# Patient Record
Sex: Female | Born: 1935 | Race: White | Hispanic: No | Marital: Married | Smoking: Former smoker
Health system: Southern US, Community
[De-identification: ages and names within clinical notes are randomized; demographics above are authoritative.]

## PROBLEM LIST (undated history)

## (undated) DIAGNOSIS — I739 Peripheral vascular disease, unspecified: Secondary | ICD-10-CM

## (undated) DIAGNOSIS — F329 Major depressive disorder, single episode, unspecified: Secondary | ICD-10-CM

## (undated) DIAGNOSIS — G4730 Sleep apnea, unspecified: Secondary | ICD-10-CM

## (undated) DIAGNOSIS — J189 Pneumonia, unspecified organism: Secondary | ICD-10-CM

## (undated) DIAGNOSIS — J449 Chronic obstructive pulmonary disease, unspecified: Secondary | ICD-10-CM

## (undated) DIAGNOSIS — S4990XA Unspecified injury of shoulder and upper arm, unspecified arm, initial encounter: Secondary | ICD-10-CM

## (undated) DIAGNOSIS — T7840XA Allergy, unspecified, initial encounter: Secondary | ICD-10-CM

## (undated) DIAGNOSIS — M1990 Unspecified osteoarthritis, unspecified site: Secondary | ICD-10-CM

## (undated) DIAGNOSIS — R0602 Shortness of breath: Secondary | ICD-10-CM

## (undated) DIAGNOSIS — K8020 Calculus of gallbladder without cholecystitis without obstruction: Secondary | ICD-10-CM

## (undated) DIAGNOSIS — E669 Obesity, unspecified: Secondary | ICD-10-CM

## (undated) DIAGNOSIS — E785 Hyperlipidemia, unspecified: Secondary | ICD-10-CM

## (undated) DIAGNOSIS — I10 Essential (primary) hypertension: Secondary | ICD-10-CM

## (undated) DIAGNOSIS — N189 Chronic kidney disease, unspecified: Secondary | ICD-10-CM

## (undated) DIAGNOSIS — F32A Depression, unspecified: Secondary | ICD-10-CM

## (undated) DIAGNOSIS — K219 Gastro-esophageal reflux disease without esophagitis: Secondary | ICD-10-CM

## (undated) DIAGNOSIS — R42 Dizziness and giddiness: Secondary | ICD-10-CM

## (undated) HISTORY — DX: Obesity, unspecified: E66.9

## (undated) HISTORY — DX: Allergy, unspecified, initial encounter: T78.40XA

## (undated) HISTORY — PX: BREAST SURGERY: SHX581

## (undated) HISTORY — DX: Gastro-esophageal reflux disease without esophagitis: K21.9

## (undated) HISTORY — DX: Chronic obstructive pulmonary disease, unspecified: J44.9

## (undated) HISTORY — DX: Unspecified osteoarthritis, unspecified site: M19.90

## (undated) HISTORY — DX: Essential (primary) hypertension: I10

## (undated) HISTORY — DX: Calculus of gallbladder without cholecystitis without obstruction: K80.20

## (undated) HISTORY — DX: Hyperlipidemia, unspecified: E78.5

## (undated) HISTORY — DX: Depression, unspecified: F32.A

## (undated) HISTORY — DX: Major depressive disorder, single episode, unspecified: F32.9

## (undated) HISTORY — PX: OTHER SURGICAL HISTORY: SHX169

## (undated) HISTORY — DX: Unspecified injury of shoulder and upper arm, unspecified arm, initial encounter: S49.90XA

## (undated) HISTORY — PX: TOTAL KNEE ARTHROPLASTY: SHX125

## (undated) HISTORY — PX: HIP ARTHROPLASTY: SHX981

## (undated) HISTORY — PX: CHOLECYSTECTOMY: SHX55

---

## 2005-11-12 ENCOUNTER — Ambulatory Visit: Payer: Self-pay | Admitting: Internal Medicine

## 2006-01-19 ENCOUNTER — Ambulatory Visit: Payer: Self-pay | Admitting: Internal Medicine

## 2006-02-02 ENCOUNTER — Inpatient Hospital Stay (HOSPITAL_COMMUNITY): Admission: RE | Admit: 2006-02-02 | Discharge: 2006-02-06 | Payer: Self-pay | Admitting: Orthopedic Surgery

## 2006-02-14 ENCOUNTER — Ambulatory Visit: Payer: Self-pay | Admitting: Internal Medicine

## 2006-03-08 ENCOUNTER — Ambulatory Visit: Payer: Self-pay | Admitting: Internal Medicine

## 2006-03-08 LAB — CONVERTED CEMR LAB
BUN: 13 mg/dL (ref 6–23)
Creatinine, Ser: 0.8 mg/dL (ref 0.4–1.2)
Crystals: NEGATIVE
Glucose, Bld: 107 mg/dL — ABNORMAL HIGH (ref 70–99)
HCT: 40.5 % (ref 36.0–46.0)
Hemoglobin: 13.2 g/dL (ref 12.0–15.0)
Iron: 47 ug/dL (ref 42–145)
Ketones, ur: NEGATIVE mg/dL
MCHC: 32.6 g/dL (ref 30.0–36.0)
MCV: 86.4 fL (ref 78.0–100.0)
Nitrite: NEGATIVE
Platelets: 226 10*3/uL (ref 150–400)
Potassium: 4.7 meq/L (ref 3.5–5.1)
RBC: 4.69 M/uL (ref 3.87–5.11)
RDW: 13.7 % (ref 11.5–14.6)
Saturation Ratios: 16 % — ABNORMAL LOW (ref 20.0–50.0)
Sodium: 139 meq/L (ref 135–145)
Specific Gravity, Urine: >=1.03 (ref 1.000–1.03)
Total Protein, Urine: NEGATIVE mg/dL
Transferrin: 209.5 mg/dL — ABNORMAL LOW (ref >=212.0)
Urine Glucose: NEGATIVE mg/dL
Urobilinogen, UA: 0.2 (ref 0.0–1.0)
WBC: 4 10*3/uL — ABNORMAL LOW (ref 4.5–10.5)
pH: 6 (ref 5.0–8.0)

## 2006-03-15 ENCOUNTER — Ambulatory Visit: Payer: Self-pay | Admitting: Internal Medicine

## 2006-04-20 ENCOUNTER — Ambulatory Visit: Payer: Self-pay | Admitting: Internal Medicine

## 2006-04-20 LAB — CONVERTED CEMR LAB
Chol/HDL Ratio, serum: 5.7
Cholesterol: 257 mg/dL (ref 0–200)
Creatinine, Ser: 0.8 mg/dL (ref 0.4–1.2)
Glucose, Bld: 110 mg/dL — ABNORMAL HIGH (ref 70–99)
HDL: 45.3 mg/dL (ref >39.0)
Hgb A1c MFr Bld: 5.5 % (ref 4.6–6.0)
LDL DIRECT: 188.6 mg/dL
Potassium: 4.4 meq/L (ref 3.5–5.1)
Sodium: 140 meq/L (ref 135–145)
Triglyceride fasting, serum: 99 mg/dL (ref 0–149)
VLDL: 20 mg/dL (ref 0–40)

## 2006-04-25 ENCOUNTER — Ambulatory Visit: Payer: Self-pay | Admitting: Internal Medicine

## 2006-05-31 ENCOUNTER — Ambulatory Visit: Payer: Self-pay | Admitting: Internal Medicine

## 2006-07-18 ENCOUNTER — Ambulatory Visit: Payer: Self-pay | Admitting: Internal Medicine

## 2006-07-19 LAB — CONVERTED CEMR LAB
ALT: 13 units/L (ref 0–40)
AST: 15 units/L (ref 0–37)
Albumin: 3.5 g/dL (ref 3.5–5.2)
Alkaline Phosphatase: 59 units/L (ref 39–117)
BUN: 15 mg/dL (ref 6–23)
Bilirubin, Direct: 0.1 mg/dL (ref 0.0–0.3)
CO2: 32 meq/L (ref 19–32)
Calcium: 8.8 mg/dL (ref 8.4–10.5)
Chloride: 105 meq/L (ref 96–112)
Cholesterol: 194 mg/dL (ref 0–200)
Creatinine, Ser: 0.8 mg/dL (ref 0.4–1.2)
GFR calc Af Amer: 91 mL/min
GFR calc non Af Amer: 75 mL/min
Glucose, Bld: 111 mg/dL — ABNORMAL HIGH (ref 70–99)
HDL: 49.5 mg/dL (ref >39.0)
LDL Cholesterol: 120 mg/dL — ABNORMAL HIGH (ref 0–99)
Potassium: 4.6 meq/L (ref 3.5–5.1)
Sodium: 142 meq/L (ref 135–145)
Total Bilirubin: 0.8 mg/dL (ref 0.3–1.2)
Total CHOL/HDL Ratio: 3.9
Total Protein: 6.9 g/dL (ref 6.0–8.3)
Triglycerides: 122 mg/dL (ref 0–149)
VLDL: 24 mg/dL (ref 0–40)

## 2006-08-05 ENCOUNTER — Encounter: Admission: RE | Admit: 2006-08-05 | Discharge: 2006-09-05 | Payer: Self-pay | Admitting: Internal Medicine

## 2006-10-21 ENCOUNTER — Ambulatory Visit: Payer: Self-pay | Admitting: Internal Medicine

## 2006-10-21 LAB — CONVERTED CEMR LAB
BUN: 12 mg/dL (ref 6–23)
CO2: 33 meq/L — ABNORMAL HIGH (ref 19–32)
Calcium: 8.7 mg/dL (ref 8.4–10.5)
Chloride: 105 meq/L (ref 96–112)
Creatinine, Ser: 0.8 mg/dL (ref 0.4–1.2)
GFR calc Af Amer: 91 mL/min
GFR calc non Af Amer: 75 mL/min
Glucose, Bld: 111 mg/dL — ABNORMAL HIGH (ref 70–99)
Hgb A1c MFr Bld: 5.9 % (ref 4.6–6.0)
Potassium: 4.8 meq/L (ref 3.5–5.1)
Sodium: 141 meq/L (ref 135–145)
TSH: 2.08 microintl units/mL (ref 0.35–5.50)
Vit D, 1,25-Dihydroxy: 23 (ref 20–57)

## 2006-10-26 ENCOUNTER — Ambulatory Visit: Payer: Self-pay | Admitting: Internal Medicine

## 2006-11-28 ENCOUNTER — Encounter (INDEPENDENT_AMBULATORY_CARE_PROVIDER_SITE_OTHER): Payer: Self-pay | Admitting: Gastroenterology

## 2006-11-28 ENCOUNTER — Ambulatory Visit (HOSPITAL_COMMUNITY): Admission: RE | Admit: 2006-11-28 | Discharge: 2006-11-28 | Payer: Self-pay | Admitting: Gastroenterology

## 2007-01-18 ENCOUNTER — Encounter: Admission: RE | Admit: 2007-01-18 | Discharge: 2007-01-18 | Payer: Self-pay | Admitting: Internal Medicine

## 2007-02-17 ENCOUNTER — Ambulatory Visit: Payer: Self-pay | Admitting: Internal Medicine

## 2007-02-17 ENCOUNTER — Encounter: Payer: Self-pay | Admitting: Internal Medicine

## 2007-02-17 DIAGNOSIS — R0600 Dyspnea, unspecified: Secondary | ICD-10-CM | POA: Insufficient documentation

## 2007-02-17 DIAGNOSIS — J309 Allergic rhinitis, unspecified: Secondary | ICD-10-CM | POA: Insufficient documentation

## 2007-02-17 DIAGNOSIS — F329 Major depressive disorder, single episode, unspecified: Secondary | ICD-10-CM | POA: Insufficient documentation

## 2007-02-17 DIAGNOSIS — N3941 Urge incontinence: Secondary | ICD-10-CM | POA: Insufficient documentation

## 2007-02-17 DIAGNOSIS — I10 Essential (primary) hypertension: Secondary | ICD-10-CM | POA: Insufficient documentation

## 2007-02-17 DIAGNOSIS — J45909 Unspecified asthma, uncomplicated: Secondary | ICD-10-CM | POA: Insufficient documentation

## 2007-02-17 DIAGNOSIS — F32A Depression, unspecified: Secondary | ICD-10-CM | POA: Insufficient documentation

## 2007-02-17 DIAGNOSIS — R0609 Other forms of dyspnea: Secondary | ICD-10-CM | POA: Insufficient documentation

## 2007-02-17 DIAGNOSIS — J4540 Moderate persistent asthma, uncomplicated: Secondary | ICD-10-CM | POA: Insufficient documentation

## 2007-02-17 DIAGNOSIS — E785 Hyperlipidemia, unspecified: Secondary | ICD-10-CM | POA: Insufficient documentation

## 2007-02-17 LAB — CONVERTED CEMR LAB
ALT: 19 units/L (ref 0–35)
AST: 19 units/L (ref 0–37)
Albumin: 3.6 g/dL (ref 3.5–5.2)
Alkaline Phosphatase: 59 units/L (ref 39–117)
BUN: 12 mg/dL (ref 6–23)
Basophils Absolute: 0 10*3/uL (ref 0.0–0.1)
Basophils Relative: 0.8 % (ref 0.0–1.0)
Bilirubin, Direct: 0.1 mg/dL (ref 0.0–0.3)
CO2: 33 meq/L — ABNORMAL HIGH (ref 19–32)
Calcium: 9.1 mg/dL (ref 8.4–10.5)
Chloride: 104 meq/L (ref 96–112)
Creatinine, Ser: 0.7 mg/dL (ref 0.4–1.2)
Eosinophils Absolute: 0.1 10*3/uL (ref 0.0–0.6)
Eosinophils Relative: 2.8 % (ref 0.0–5.0)
GFR calc Af Amer: 106 mL/min
GFR calc non Af Amer: 88 mL/min
Glucose, Bld: 114 mg/dL — ABNORMAL HIGH (ref 70–99)
HCT: 42 % (ref 36.0–46.0)
Hemoglobin: 14.4 g/dL (ref 12.0–15.0)
Lymphocytes Relative: 35.5 % (ref 12.0–46.0)
MCHC: 34.2 g/dL (ref 30.0–36.0)
MCV: 85.9 fL (ref 78.0–100.0)
Monocytes Absolute: 0.4 10*3/uL (ref 0.2–0.7)
Monocytes Relative: 8.5 % (ref 3.0–11.0)
Neutro Abs: 2.3 10*3/uL (ref 1.4–7.7)
Neutrophils Relative %: 52.4 % (ref 43.0–77.0)
Platelets: 177 10*3/uL (ref 150–400)
Potassium: 4.5 meq/L (ref 3.5–5.1)
Pro B Natriuretic peptide (BNP): 30 pg/mL (ref 0.0–100.0)
RBC: 4.89 M/uL (ref 3.87–5.11)
RDW: 12.9 % (ref 11.5–14.6)
Sodium: 142 meq/L (ref 135–145)
TSH: 1.75 microintl units/mL (ref 0.35–5.50)
Total Bilirubin: 1.1 mg/dL (ref 0.3–1.2)
Total Protein: 7 g/dL (ref 6.0–8.3)
WBC: 4.3 10*3/uL — ABNORMAL LOW (ref 4.5–10.5)

## 2007-02-28 ENCOUNTER — Encounter: Payer: Self-pay | Admitting: Internal Medicine

## 2007-02-28 ENCOUNTER — Ambulatory Visit: Payer: Self-pay | Admitting: Internal Medicine

## 2007-05-02 ENCOUNTER — Ambulatory Visit: Payer: Self-pay | Admitting: Internal Medicine

## 2007-05-05 LAB — CONVERTED CEMR LAB
BUN: 12 mg/dL (ref 6–23)
Bilirubin Urine: NEGATIVE
CO2: 34 meq/L — ABNORMAL HIGH (ref 19–32)
Calcium: 9.6 mg/dL (ref 8.4–10.5)
Chloride: 101 meq/L (ref 96–112)
Cholesterol: 217 mg/dL (ref 0–200)
Creatinine, Ser: 0.7 mg/dL (ref 0.4–1.2)
Direct LDL: 144 mg/dL
GFR calc Af Amer: 106 mL/min
GFR calc non Af Amer: 88 mL/min
Glucose, Bld: 112 mg/dL — ABNORMAL HIGH (ref 70–99)
HDL: 49.2 mg/dL (ref >39.0)
Hemoglobin, Urine: NEGATIVE
Hgb A1c MFr Bld: 6.1 % — ABNORMAL HIGH (ref 4.6–6.0)
Ketones, ur: NEGATIVE mg/dL
Leukocytes, UA: NEGATIVE
Nitrite: NEGATIVE
Potassium: 5.1 meq/L (ref 3.5–5.1)
Sodium: 140 meq/L (ref 135–145)
Specific Gravity, Urine: 1.01 (ref 1.000–1.03)
Total CHOL/HDL Ratio: 4.4
Total Protein, Urine: NEGATIVE mg/dL
Triglycerides: 138 mg/dL (ref 0–149)
Urine Glucose: NEGATIVE mg/dL
Urobilinogen, UA: 0.2 (ref 0.0–1.0)
VLDL: 28 mg/dL (ref 0–40)
pH: 6 (ref 5.0–8.0)

## 2007-07-06 ENCOUNTER — Ambulatory Visit: Payer: Self-pay | Admitting: Internal Medicine

## 2007-07-06 DIAGNOSIS — M79609 Pain in unspecified limb: Secondary | ICD-10-CM | POA: Insufficient documentation

## 2007-10-22 ENCOUNTER — Emergency Department (HOSPITAL_COMMUNITY): Admission: EM | Admit: 2007-10-22 | Discharge: 2007-10-22 | Payer: Self-pay | Admitting: Family Medicine

## 2007-10-23 ENCOUNTER — Ambulatory Visit: Payer: Self-pay | Admitting: Internal Medicine

## 2007-10-23 DIAGNOSIS — K089 Disorder of teeth and supporting structures, unspecified: Secondary | ICD-10-CM | POA: Insufficient documentation

## 2007-10-23 DIAGNOSIS — B029 Zoster without complications: Secondary | ICD-10-CM | POA: Insufficient documentation

## 2007-10-23 DIAGNOSIS — R209 Unspecified disturbances of skin sensation: Secondary | ICD-10-CM | POA: Insufficient documentation

## 2007-11-01 ENCOUNTER — Ambulatory Visit: Payer: Self-pay | Admitting: Internal Medicine

## 2007-11-01 LAB — CONVERTED CEMR LAB
ALT: 19 units/L (ref 0–35)
AST: 18 units/L (ref 0–37)
Albumin: 3.6 g/dL (ref 3.5–5.2)
Alkaline Phosphatase: 49 units/L (ref 39–117)
Bilirubin, Direct: 0.1 mg/dL (ref 0.0–0.3)
Cholesterol: 214 mg/dL (ref 0–200)
Direct LDL: 158.4 mg/dL
HDL: 41.5 mg/dL (ref >39.0)
TSH: 1.41 microintl units/mL (ref 0.35–5.50)
Total Bilirubin: 0.7 mg/dL (ref 0.3–1.2)
Total CHOL/HDL Ratio: 5.2
Total CK: 94 units/L (ref 7–177)
Total Protein: 6.8 g/dL (ref 6.0–8.3)
Triglycerides: 123 mg/dL (ref 0–149)
VLDL: 25 mg/dL (ref 0–40)

## 2007-11-03 ENCOUNTER — Ambulatory Visit: Payer: Self-pay | Admitting: Internal Medicine

## 2007-11-03 DIAGNOSIS — I872 Venous insufficiency (chronic) (peripheral): Secondary | ICD-10-CM | POA: Insufficient documentation

## 2007-11-03 DIAGNOSIS — R609 Edema, unspecified: Secondary | ICD-10-CM | POA: Insufficient documentation

## 2007-11-06 ENCOUNTER — Encounter (INDEPENDENT_AMBULATORY_CARE_PROVIDER_SITE_OTHER): Payer: Self-pay | Admitting: *Deleted

## 2008-01-26 ENCOUNTER — Encounter: Admission: RE | Admit: 2008-01-26 | Discharge: 2008-01-26 | Payer: Self-pay | Admitting: Internal Medicine

## 2008-02-14 ENCOUNTER — Encounter: Payer: Self-pay | Admitting: Internal Medicine

## 2008-02-22 ENCOUNTER — Encounter: Payer: Self-pay | Admitting: Internal Medicine

## 2008-02-26 ENCOUNTER — Ambulatory Visit: Payer: Self-pay | Admitting: Internal Medicine

## 2008-02-26 LAB — CONVERTED CEMR LAB
BUN: 15 mg/dL (ref 6–23)
CO2: 33 meq/L — ABNORMAL HIGH (ref 19–32)
Calcium: 8.7 mg/dL (ref 8.4–10.5)
Chloride: 102 meq/L (ref 96–112)
Creatinine, Ser: 0.7 mg/dL (ref 0.4–1.2)
GFR calc Af Amer: 106 mL/min
GFR calc non Af Amer: 87 mL/min
Glucose, Bld: 94 mg/dL (ref 70–99)
Potassium: 4.5 meq/L (ref 3.5–5.1)
Sodium: 141 meq/L (ref 135–145)

## 2008-03-04 ENCOUNTER — Ambulatory Visit: Payer: Self-pay | Admitting: Internal Medicine

## 2008-03-12 ENCOUNTER — Encounter: Payer: Self-pay | Admitting: Internal Medicine

## 2008-03-14 ENCOUNTER — Encounter: Payer: Self-pay | Admitting: Internal Medicine

## 2008-03-14 ENCOUNTER — Ambulatory Visit: Payer: Self-pay

## 2008-03-25 ENCOUNTER — Ambulatory Visit: Payer: Self-pay | Admitting: Internal Medicine

## 2008-03-25 DIAGNOSIS — K120 Recurrent oral aphthae: Secondary | ICD-10-CM | POA: Insufficient documentation

## 2008-04-04 ENCOUNTER — Ambulatory Visit: Payer: Self-pay | Admitting: Internal Medicine

## 2008-04-04 DIAGNOSIS — J209 Acute bronchitis, unspecified: Secondary | ICD-10-CM | POA: Insufficient documentation

## 2008-04-05 ENCOUNTER — Encounter (INDEPENDENT_AMBULATORY_CARE_PROVIDER_SITE_OTHER): Payer: Self-pay | Admitting: *Deleted

## 2008-05-29 ENCOUNTER — Ambulatory Visit: Payer: Self-pay | Admitting: Internal Medicine

## 2008-05-29 LAB — CONVERTED CEMR LAB
BUN: 17 mg/dL (ref 6–23)
CO2: 35 meq/L — ABNORMAL HIGH (ref 19–32)
Calcium: 9.2 mg/dL (ref 8.4–10.5)
Chloride: 100 meq/L (ref 96–112)
Cholesterol: 214 mg/dL (ref 0–200)
Creatinine, Ser: 0.8 mg/dL (ref 0.4–1.2)
Direct LDL: 142.6 mg/dL
GFR calc Af Amer: 91 mL/min
GFR calc non Af Amer: 75 mL/min
Glucose, Bld: 128 mg/dL — ABNORMAL HIGH (ref 70–99)
HDL: 42.8 mg/dL (ref >39.0)
Hgb A1c MFr Bld: 6.2 % — ABNORMAL HIGH (ref 4.6–6.0)
Potassium: 4.3 meq/L (ref 3.5–5.1)
Sodium: 140 meq/L (ref 135–145)
TSH: 1.71 microintl units/mL (ref 0.35–5.50)
Total CHOL/HDL Ratio: 5
Triglycerides: 148 mg/dL (ref 0–149)
VLDL: 30 mg/dL (ref 0–40)

## 2008-06-05 ENCOUNTER — Ambulatory Visit: Payer: Self-pay | Admitting: Internal Medicine

## 2008-06-05 DIAGNOSIS — R7309 Other abnormal glucose: Secondary | ICD-10-CM | POA: Insufficient documentation

## 2008-06-05 DIAGNOSIS — K219 Gastro-esophageal reflux disease without esophagitis: Secondary | ICD-10-CM | POA: Insufficient documentation

## 2008-06-05 DIAGNOSIS — M1990 Unspecified osteoarthritis, unspecified site: Secondary | ICD-10-CM | POA: Insufficient documentation

## 2008-06-12 ENCOUNTER — Ambulatory Visit (HOSPITAL_BASED_OUTPATIENT_CLINIC_OR_DEPARTMENT_OTHER): Admission: RE | Admit: 2008-06-12 | Discharge: 2008-06-12 | Payer: Self-pay | Admitting: Orthopedic Surgery

## 2008-06-13 ENCOUNTER — Telehealth: Payer: Self-pay | Admitting: Internal Medicine

## 2008-06-28 ENCOUNTER — Encounter: Payer: Self-pay | Admitting: Internal Medicine

## 2008-07-15 ENCOUNTER — Telehealth: Payer: Self-pay | Admitting: Internal Medicine

## 2008-09-02 ENCOUNTER — Ambulatory Visit: Payer: Self-pay | Admitting: Internal Medicine

## 2008-09-02 LAB — CONVERTED CEMR LAB
ALT: 15 units/L (ref 0–35)
AST: 18 units/L (ref 0–37)
Albumin: 3.3 g/dL — ABNORMAL LOW (ref 3.5–5.2)
Alkaline Phosphatase: 52 units/L (ref 39–117)
BUN: 18 mg/dL (ref 6–23)
Basophils Absolute: 0.1 10*3/uL (ref 0.0–0.1)
Basophils Relative: 1 % (ref 0.0–3.0)
Bilirubin, Direct: 0.1 mg/dL (ref 0.0–0.3)
CO2: 34 meq/L — ABNORMAL HIGH (ref 19–32)
Calcium: 8.8 mg/dL (ref 8.4–10.5)
Chloride: 107 meq/L (ref 96–112)
Cholesterol: 207 mg/dL — ABNORMAL HIGH (ref 0–200)
Creatinine, Ser: 0.9 mg/dL (ref 0.4–1.2)
Direct LDL: 148.5 mg/dL
Eosinophils Absolute: 0.2 10*3/uL (ref 0.0–0.7)
Eosinophils Relative: 3.9 % (ref 0.0–5.0)
GFR calc non Af Amer: 65.25 mL/min (ref >60)
Glucose, Bld: 117 mg/dL — ABNORMAL HIGH (ref 70–99)
HCT: 39 % (ref 36.0–46.0)
HDL: 41.5 mg/dL (ref >39.00)
Hemoglobin: 13.2 g/dL (ref 12.0–15.0)
Hgb A1c MFr Bld: 6 % (ref 4.6–6.5)
Lymphocytes Relative: 28.9 % (ref 12.0–46.0)
Lymphs Abs: 1.5 10*3/uL (ref 0.7–4.0)
MCHC: 33.8 g/dL (ref 30.0–36.0)
MCV: 87.3 fL (ref 78.0–100.0)
Monocytes Absolute: 0.4 10*3/uL (ref 0.1–1.0)
Monocytes Relative: 8 % (ref 3.0–12.0)
Neutro Abs: 2.9 10*3/uL (ref 1.4–7.7)
Neutrophils Relative %: 58.2 % (ref 43.0–77.0)
Platelets: 190 10*3/uL (ref 150.0–400.0)
Potassium: 4.9 meq/L (ref 3.5–5.1)
RBC: 4.47 M/uL (ref 3.87–5.11)
RDW: 12.7 % (ref 11.5–14.6)
Sodium: 145 meq/L (ref 135–145)
TSH: 2.17 microintl units/mL (ref 0.35–5.50)
Total Bilirubin: 0.6 mg/dL (ref 0.3–1.2)
Total CHOL/HDL Ratio: 5
Total Protein: 6.6 g/dL (ref 6.0–8.3)
Triglycerides: 83 mg/dL (ref 0.0–149.0)
VLDL: 16.6 mg/dL (ref 0.0–40.0)
WBC: 5.1 10*3/uL (ref 4.5–10.5)

## 2008-09-06 ENCOUNTER — Ambulatory Visit: Payer: Self-pay | Admitting: Internal Medicine

## 2008-09-06 DIAGNOSIS — R079 Chest pain, unspecified: Secondary | ICD-10-CM | POA: Insufficient documentation

## 2008-09-06 DIAGNOSIS — K5732 Diverticulitis of large intestine without perforation or abscess without bleeding: Secondary | ICD-10-CM | POA: Insufficient documentation

## 2008-09-06 DIAGNOSIS — K5730 Diverticulosis of large intestine without perforation or abscess without bleeding: Secondary | ICD-10-CM | POA: Insufficient documentation

## 2008-12-09 ENCOUNTER — Ambulatory Visit: Payer: Self-pay | Admitting: Internal Medicine

## 2008-12-09 LAB — CONVERTED CEMR LAB
ALT: 20 units/L (ref 0–35)
AST: 23 units/L (ref 0–37)
Albumin: 3.7 g/dL (ref 3.5–5.2)
Alkaline Phosphatase: 63 units/L (ref 39–117)
BUN: 17 mg/dL (ref 6–23)
Basophils Absolute: 0 10*3/uL (ref 0.0–0.1)
Basophils Relative: 1 % (ref 0.0–3.0)
Bilirubin, Direct: 0.1 mg/dL (ref 0.0–0.3)
CO2: 33 meq/L — ABNORMAL HIGH (ref 19–32)
Calcium: 9 mg/dL (ref 8.4–10.5)
Chloride: 102 meq/L (ref 96–112)
Cholesterol: 235 mg/dL — ABNORMAL HIGH (ref 0–200)
Creatinine, Ser: 1 mg/dL (ref 0.4–1.2)
Direct LDL: 169 mg/dL
Eosinophils Absolute: 0.2 10*3/uL (ref 0.0–0.7)
Eosinophils Relative: 3.3 % (ref 0.0–5.0)
GFR calc non Af Amer: 57.73 mL/min (ref >60)
Glucose, Bld: 120 mg/dL — ABNORMAL HIGH (ref 70–99)
HCT: 44.1 % (ref 36.0–46.0)
HDL: 48.8 mg/dL (ref >39.00)
Hemoglobin: 15.1 g/dL — ABNORMAL HIGH (ref 12.0–15.0)
Lymphocytes Relative: 36.2 % (ref 12.0–46.0)
Lymphs Abs: 1.7 10*3/uL (ref 0.7–4.0)
MCHC: 34.2 g/dL (ref 30.0–36.0)
MCV: 86.3 fL (ref 78.0–100.0)
Monocytes Absolute: 0.5 10*3/uL (ref 0.1–1.0)
Monocytes Relative: 10.2 % (ref 3.0–12.0)
Neutro Abs: 2.3 10*3/uL (ref 1.4–7.7)
Neutrophils Relative %: 49.3 % (ref 43.0–77.0)
Platelets: 159 10*3/uL (ref 150.0–400.0)
Potassium: 4.2 meq/L (ref 3.5–5.1)
RBC: 5.11 M/uL (ref 3.87–5.11)
RDW: 13.3 % (ref 11.5–14.6)
Sodium: 142 meq/L (ref 135–145)
Total Bilirubin: 1 mg/dL (ref 0.3–1.2)
Total CHOL/HDL Ratio: 5
Total Protein: 7.5 g/dL (ref 6.0–8.3)
Triglycerides: 154 mg/dL — ABNORMAL HIGH (ref 0.0–149.0)
VLDL: 30.8 mg/dL (ref 0.0–40.0)
WBC: 4.7 10*3/uL (ref 4.5–10.5)

## 2008-12-12 ENCOUNTER — Ambulatory Visit: Payer: Self-pay | Admitting: Internal Medicine

## 2008-12-17 ENCOUNTER — Telehealth: Payer: Self-pay | Admitting: Internal Medicine

## 2009-02-04 ENCOUNTER — Encounter: Admission: RE | Admit: 2009-02-04 | Discharge: 2009-02-04 | Payer: Self-pay | Admitting: Internal Medicine

## 2009-03-12 ENCOUNTER — Telehealth: Payer: Self-pay | Admitting: Internal Medicine

## 2009-04-04 ENCOUNTER — Ambulatory Visit: Payer: Self-pay | Admitting: Internal Medicine

## 2009-04-07 LAB — CONVERTED CEMR LAB
BUN: 14 mg/dL (ref 6–23)
CO2: 33 meq/L — ABNORMAL HIGH (ref 19–32)
Calcium: 9 mg/dL (ref 8.4–10.5)
Chloride: 102 meq/L (ref 96–112)
Cholesterol: 183 mg/dL (ref 0–200)
Creatinine, Ser: 0.9 mg/dL (ref 0.4–1.2)
GFR calc non Af Amer: 65.14 mL/min (ref >60)
Glucose, Bld: 106 mg/dL — ABNORMAL HIGH (ref 70–99)
HDL: 44 mg/dL (ref >39.00)
Hgb A1c MFr Bld: 5.9 % (ref 4.6–6.5)
LDL Cholesterol: 107 mg/dL — ABNORMAL HIGH (ref 0–99)
Potassium: 4.4 meq/L (ref 3.5–5.1)
Sodium: 143 meq/L (ref 135–145)
Total CHOL/HDL Ratio: 4
Triglycerides: 159 mg/dL — ABNORMAL HIGH (ref 0.0–149.0)
VLDL: 31.8 mg/dL (ref 0.0–40.0)

## 2009-04-11 ENCOUNTER — Ambulatory Visit: Payer: Self-pay | Admitting: Internal Medicine

## 2009-04-11 DIAGNOSIS — Z87891 Personal history of nicotine dependence: Secondary | ICD-10-CM | POA: Insufficient documentation

## 2009-04-11 LAB — CONVERTED CEMR LAB
Bilirubin Urine: NEGATIVE
Hemoglobin, Urine: NEGATIVE
Ketones, ur: NEGATIVE mg/dL
Leukocytes, UA: NEGATIVE
Nitrite: NEGATIVE
Specific Gravity, Urine: 1.01 (ref 1.000–1.030)
Total Protein, Urine: NEGATIVE mg/dL
Urine Glucose: NEGATIVE mg/dL
Urobilinogen, UA: 0.2 (ref 0.0–1.0)
pH: 7.5 (ref 5.0–8.0)

## 2009-04-16 ENCOUNTER — Telehealth (INDEPENDENT_AMBULATORY_CARE_PROVIDER_SITE_OTHER): Payer: Self-pay | Admitting: *Deleted

## 2009-04-21 ENCOUNTER — Ambulatory Visit: Payer: Self-pay | Admitting: Cardiology

## 2009-04-21 ENCOUNTER — Ambulatory Visit: Payer: Self-pay

## 2009-04-21 ENCOUNTER — Encounter (HOSPITAL_COMMUNITY): Admission: RE | Admit: 2009-04-21 | Discharge: 2009-05-21 | Payer: Self-pay | Admitting: Internal Medicine

## 2009-05-02 ENCOUNTER — Telehealth: Payer: Self-pay | Admitting: Internal Medicine

## 2009-05-21 ENCOUNTER — Ambulatory Visit: Payer: Self-pay | Admitting: Internal Medicine

## 2009-06-16 ENCOUNTER — Encounter: Payer: Self-pay | Admitting: Internal Medicine

## 2009-06-16 ENCOUNTER — Telehealth: Payer: Self-pay | Admitting: Internal Medicine

## 2009-06-18 ENCOUNTER — Ambulatory Visit: Payer: Self-pay | Admitting: Internal Medicine

## 2009-06-18 LAB — CONVERTED CEMR LAB
Albumin ELP: 56.3 % (ref 55.8–66.1)
Alpha-1-Globulin: 4.6 % (ref 2.9–4.9)
Alpha-2-Globulin: 11.5 % (ref 7.1–11.8)
BUN: 16 mg/dL (ref 6–23)
Beta Globulin: 5.8 % (ref 4.7–7.2)
CO2: 30 meq/L (ref 19–32)
Calcium: 9 mg/dL (ref 8.4–10.5)
Chloride: 102 meq/L (ref 96–112)
Creatinine, Ser: 0.8 mg/dL (ref 0.4–1.2)
GFR calc non Af Amer: 74.58 mL/min (ref >60)
Gamma Globulin: 16.5 % (ref 11.1–18.8)
Glucose, Bld: 108 mg/dL — ABNORMAL HIGH (ref 70–99)
Hgb A1c MFr Bld: 6 % (ref 4.6–6.5)
Potassium: 3.9 meq/L (ref 3.5–5.1)
Sodium: 140 meq/L (ref 135–145)
TSH: 2.49 microintl units/mL (ref 0.35–5.50)
Total Protein, Serum Electrophoresis: 7.1 g/dL (ref 6.0–8.3)

## 2009-06-23 ENCOUNTER — Ambulatory Visit: Payer: Self-pay | Admitting: Internal Medicine

## 2009-06-23 DIAGNOSIS — G4700 Insomnia, unspecified: Secondary | ICD-10-CM | POA: Insufficient documentation

## 2009-07-13 ENCOUNTER — Encounter: Payer: Self-pay | Admitting: Internal Medicine

## 2009-07-28 ENCOUNTER — Ambulatory Visit: Payer: Self-pay | Admitting: Internal Medicine

## 2009-08-22 ENCOUNTER — Telehealth: Payer: Self-pay | Admitting: Internal Medicine

## 2009-09-22 ENCOUNTER — Ambulatory Visit: Payer: Self-pay | Admitting: Internal Medicine

## 2009-09-22 LAB — CONVERTED CEMR LAB
ALT: 19 units/L (ref 0–35)
AST: 19 units/L (ref 0–37)
Albumin: 3.8 g/dL (ref 3.5–5.2)
Alkaline Phosphatase: 53 units/L (ref 39–117)
BUN: 12 mg/dL (ref 6–23)
Bilirubin, Direct: 0.1 mg/dL (ref 0.0–0.3)
CO2: 33 meq/L — ABNORMAL HIGH (ref 19–32)
Calcium: 8.9 mg/dL (ref 8.4–10.5)
Chloride: 102 meq/L (ref 96–112)
Creatinine, Ser: 0.9 mg/dL (ref 0.4–1.2)
GFR calc non Af Amer: 65.06 mL/min (ref >60)
Glucose, Bld: 100 mg/dL — ABNORMAL HIGH (ref 70–99)
Hgb A1c MFr Bld: 5.8 % (ref 4.6–6.5)
Potassium: 4 meq/L (ref 3.5–5.1)
Sodium: 140 meq/L (ref 135–145)
TSH: 2.1 microintl units/mL (ref 0.35–5.50)
Total Bilirubin: 0.8 mg/dL (ref 0.3–1.2)
Total Protein: 6.4 g/dL (ref 6.0–8.3)

## 2009-09-29 ENCOUNTER — Ambulatory Visit: Payer: Self-pay | Admitting: Internal Medicine

## 2009-11-25 ENCOUNTER — Ambulatory Visit: Payer: Self-pay | Admitting: Internal Medicine

## 2009-11-25 LAB — CONVERTED CEMR LAB
ALT: 15 units/L (ref 0–35)
AST: 17 units/L (ref 0–37)
Albumin: 3.8 g/dL (ref 3.5–5.2)
Alkaline Phosphatase: 53 units/L (ref 39–117)
BUN: 18 mg/dL (ref 6–23)
Bilirubin, Direct: 0.1 mg/dL (ref 0.0–0.3)
CO2: 31 meq/L (ref 19–32)
Calcium: 8.3 mg/dL — ABNORMAL LOW (ref 8.4–10.5)
Chloride: 107 meq/L (ref 96–112)
Cholesterol: 212 mg/dL — ABNORMAL HIGH (ref 0–200)
Creatinine, Ser: 0.7 mg/dL (ref 0.4–1.2)
Direct LDL: 133.8 mg/dL
GFR calc non Af Amer: 89.86 mL/min (ref >60)
Glucose, Bld: 97 mg/dL (ref 70–99)
HDL: 50.4 mg/dL (ref >39.00)
Hgb A1c MFr Bld: 5.8 % (ref 4.6–6.5)
Potassium: 4.4 meq/L (ref 3.5–5.1)
Sodium: 141 meq/L (ref 135–145)
Total Bilirubin: 0.5 mg/dL (ref 0.3–1.2)
Total CHOL/HDL Ratio: 4
Total Protein: 6.4 g/dL (ref 6.0–8.3)
Triglycerides: 149 mg/dL (ref 0.0–149.0)
VLDL: 29.8 mg/dL (ref 0.0–40.0)
Vitamin B-12: 419 pg/mL (ref 211–911)

## 2009-11-28 ENCOUNTER — Ambulatory Visit: Payer: Self-pay | Admitting: Internal Medicine

## 2009-11-28 DIAGNOSIS — R42 Dizziness and giddiness: Secondary | ICD-10-CM | POA: Insufficient documentation

## 2009-11-28 DIAGNOSIS — G5600 Carpal tunnel syndrome, unspecified upper limb: Secondary | ICD-10-CM | POA: Insufficient documentation

## 2009-11-28 DIAGNOSIS — M542 Cervicalgia: Secondary | ICD-10-CM | POA: Insufficient documentation

## 2010-01-02 ENCOUNTER — Ambulatory Visit: Payer: Self-pay | Admitting: Internal Medicine

## 2010-01-07 ENCOUNTER — Emergency Department (HOSPITAL_COMMUNITY): Admission: EM | Admit: 2010-01-07 | Discharge: 2010-01-07 | Payer: Self-pay | Admitting: Family Medicine

## 2010-01-30 ENCOUNTER — Ambulatory Visit: Payer: Self-pay | Admitting: Internal Medicine

## 2010-02-05 ENCOUNTER — Encounter: Admission: RE | Admit: 2010-02-05 | Discharge: 2010-02-05 | Payer: Self-pay | Admitting: Internal Medicine

## 2010-02-17 ENCOUNTER — Encounter: Admission: RE | Admit: 2010-02-17 | Discharge: 2010-02-17 | Payer: Self-pay | Admitting: Internal Medicine

## 2010-02-17 LAB — HM MAMMOGRAPHY: HM Mammogram: NEGATIVE

## 2010-02-25 ENCOUNTER — Telehealth: Payer: Self-pay | Admitting: Internal Medicine

## 2010-04-02 ENCOUNTER — Ambulatory Visit: Payer: Self-pay | Admitting: Internal Medicine

## 2010-04-03 LAB — CONVERTED CEMR LAB
ALT: 17 units/L (ref 0–35)
AST: 19 units/L (ref 0–37)
Albumin: 3.7 g/dL (ref 3.5–5.2)
Alkaline Phosphatase: 61 units/L (ref 39–117)
BUN: 12 mg/dL (ref 6–23)
Bilirubin, Direct: 0.1 mg/dL (ref 0.0–0.3)
CO2: 31 meq/L (ref 19–32)
Calcium: 9.4 mg/dL (ref 8.4–10.5)
Chloride: 103 meq/L (ref 96–112)
Cholesterol: 295 mg/dL — ABNORMAL HIGH (ref 0–200)
Creatinine, Ser: 0.8 mg/dL (ref 0.4–1.2)
Direct LDL: 217.8 mg/dL
GFR calc non Af Amer: 71.33 mL/min (ref >60)
Glucose, Bld: 106 mg/dL — ABNORMAL HIGH (ref 70–99)
HDL: 50.9 mg/dL (ref >39.00)
Hgb A1c MFr Bld: 6 % (ref 4.6–6.5)
Potassium: 5.6 meq/L — ABNORMAL HIGH (ref 3.5–5.1)
Sodium: 142 meq/L (ref 135–145)
TSH: 1.78 microintl units/mL (ref 0.35–5.50)
Total Bilirubin: 0.8 mg/dL (ref 0.3–1.2)
Total CHOL/HDL Ratio: 6
Total Protein: 6.6 g/dL (ref 6.0–8.3)
Triglycerides: 152 mg/dL — ABNORMAL HIGH (ref 0.0–149.0)
VLDL: 30.4 mg/dL (ref 0.0–40.0)
Vitamin B-12: 508 pg/mL (ref 211–911)

## 2010-04-08 ENCOUNTER — Ambulatory Visit: Payer: Self-pay | Admitting: Internal Medicine

## 2010-04-08 DIAGNOSIS — L570 Actinic keratosis: Secondary | ICD-10-CM | POA: Insufficient documentation

## 2010-05-08 ENCOUNTER — Emergency Department (HOSPITAL_COMMUNITY)
Admission: EM | Admit: 2010-05-08 | Discharge: 2010-05-08 | Payer: Self-pay | Source: Home / Self Care | Admitting: Family Medicine

## 2010-05-10 ENCOUNTER — Emergency Department (HOSPITAL_COMMUNITY)
Admission: EM | Admit: 2010-05-10 | Discharge: 2010-05-10 | Payer: Self-pay | Source: Home / Self Care | Admitting: Emergency Medicine

## 2010-05-21 ENCOUNTER — Ambulatory Visit: Payer: Self-pay | Admitting: Internal Medicine

## 2010-05-21 DIAGNOSIS — M25519 Pain in unspecified shoulder: Secondary | ICD-10-CM | POA: Insufficient documentation

## 2010-05-21 DIAGNOSIS — S060X9A Concussion with loss of consciousness of unspecified duration, initial encounter: Secondary | ICD-10-CM | POA: Insufficient documentation

## 2010-06-05 ENCOUNTER — Other Ambulatory Visit: Payer: Self-pay | Admitting: Internal Medicine

## 2010-06-05 ENCOUNTER — Ambulatory Visit
Admission: RE | Admit: 2010-06-05 | Discharge: 2010-06-05 | Payer: Self-pay | Source: Home / Self Care | Attending: Internal Medicine | Admitting: Internal Medicine

## 2010-06-05 LAB — CBC WITH DIFFERENTIAL/PLATELET
Basophils Absolute: 0 10*3/uL (ref 0.0–0.1)
Basophils Relative: 0.6 % (ref 0.0–3.0)
Eosinophils Absolute: 0.1 10*3/uL (ref 0.0–0.7)
Eosinophils Relative: 2.7 % (ref 0.0–5.0)
HCT: 43.6 % (ref 36.0–46.0)
Hemoglobin: 14.5 g/dL (ref 12.0–15.0)
Lymphocytes Relative: 32.3 % (ref 12.0–46.0)
Lymphs Abs: 1.6 10*3/uL (ref 0.7–4.0)
MCHC: 33.3 g/dL (ref 30.0–36.0)
MCV: 88.2 fl (ref 78.0–100.0)
Monocytes Absolute: 0.4 10*3/uL (ref 0.1–1.0)
Monocytes Relative: 8.5 % (ref 3.0–12.0)
Neutro Abs: 2.8 10*3/uL (ref 1.4–7.7)
Neutrophils Relative %: 55.9 % (ref 43.0–77.0)
Platelets: 184 10*3/uL (ref 150.0–400.0)
RBC: 4.94 Mil/uL (ref 3.87–5.11)
RDW: 13.5 % (ref 11.5–14.6)
WBC: 5 10*3/uL (ref 4.5–10.5)

## 2010-06-05 LAB — BASIC METABOLIC PANEL
BUN: 12 mg/dL (ref 6–23)
CO2: 34 mEq/L — ABNORMAL HIGH (ref 19–32)
Calcium: 9.6 mg/dL (ref 8.4–10.5)
Chloride: 100 mEq/L (ref 96–112)
Creatinine, Ser: 0.7 mg/dL (ref 0.4–1.2)
GFR: 88.23 mL/min (ref >60.00)
Glucose, Bld: 86 mg/dL (ref 70–99)
Potassium: 5.3 mEq/L — ABNORMAL HIGH (ref 3.5–5.1)
Sodium: 140 mEq/L (ref 135–145)

## 2010-06-05 LAB — HEPATIC FUNCTION PANEL
ALT: 16 U/L (ref 0–35)
AST: 18 U/L (ref 0–37)
Albumin: 3.8 g/dL (ref 3.5–5.2)
Alkaline Phosphatase: 81 U/L (ref 39–117)
Bilirubin, Direct: 0.1 mg/dL (ref 0.0–0.3)
Total Bilirubin: 0.8 mg/dL (ref 0.3–1.2)
Total Protein: 6.9 g/dL (ref 6.0–8.3)

## 2010-06-05 LAB — URINALYSIS
Bilirubin Urine: NEGATIVE
Hemoglobin, Urine: NEGATIVE
Ketones, ur: NEGATIVE
Leukocytes, UA: NEGATIVE
Nitrite: NEGATIVE
Specific Gravity, Urine: 1.01 (ref 1.000–1.030)
Total Protein, Urine: NEGATIVE
Urine Glucose: NEGATIVE
Urobilinogen, UA: 0.2 (ref 0.0–1.0)
pH: 7 (ref 5.0–8.0)

## 2010-06-05 LAB — BRAIN NATRIURETIC PEPTIDE: Pro B Natriuretic peptide (BNP): 37.7 pg/mL (ref 0.0–100.0)

## 2010-06-14 ENCOUNTER — Encounter: Payer: Self-pay | Admitting: Internal Medicine

## 2010-06-23 NOTE — Letter (Signed)
Summary: Generic Letter  [facility]  [address] [place], [place] [address]   Phone: [phone]  Fax: [phone]    05/02/2007  Sabrina Page [address] [place], [place]  [address]   Sabrina Page has been a patient of mine. She as been ill and unable to travel to [place] untill better.           Sincerely,   Jacinta Shoe MD [facility]

## 2010-06-23 NOTE — Assessment & Plan Note (Signed)
Summary: 3 MO FU/$50/PN   Vital Signs:  Patient Profile:   77 Years Old Female Weight:      239 pounds Temp:     97.8 degrees F oral Pulse rate:   96 / minute BP sitting:   140 / 100  (left arm)  Vitals Entered By: Tora Perches (June 05, 2008 1:54 PM)                 Chief Complaint:  Multiple medical problems or concerns.  History of Present Illness: The patient presents for a follow up of hypertension, asthma, hyperlipidemia, OA. Not taking Atenolol and simvastatin for ? reason     Current Allergies: ENALAPRIL MALEATE  Past Medical History:    Reviewed history from 02/17/2007 and no changes required:       Allergic rhinitis       Asthma       Hyperlipidemia       Hypertension       Depression       GERD       Osteoarthritis       Obesity       Urin incont   Family History:    Reviewed history from 05/02/2007 and no changes required:       Family History Hypertension  Social History:    Reviewed history from 02/17/2007 and no changes required:       Retired       Married       Former Smoker    Review of Systems  The patient denies fever, hoarseness, chest pain, syncope, dyspnea on exertion, headaches, hemoptysis, and abdominal pain.     Physical Exam  General:     overweight-appearing.   Nose:     External nasal examination shows no deformity or inflammation. Nasal mucosa are pink and moist without lesions or exudates. Mouth:     Erythematous throat mucosa and intranasal erythema.   Neck:     No deformities, masses, or tenderness noted. Lungs:     CTA Heart:     RRR Abdomen:     Bowel sounds positive,abdomen soft and non-tender without masses, organomegaly or hernias noted. Msk:     Lumbar-sacral spine is tender to palpation over paraspinal muscles and painfull with the ROM  Limp Extremities:     trace left pedal edema and trace right pedal edema.   Neurologic:     No cranial nerve deficits noted. Station and gait are normal.  Plantar reflexes are down-going bilaterally. DTRs are symmetrical throughout. Sensory, motor and coordinative functions appear intact.    Impression & Recommendations:  Problem # 1:  HYPERTENSION (ICD-401.9) Assessment: Unchanged  Her updated medication list for this problem includes:    Dyazide 37.5-25 Mg Caps (Triamterene-hctz) .Marland Kitchen... 1 by mouth q am c/o dry mouth - OK to try 1/2 qd    Atenolol 25 Mg Tabs (Atenolol) .Marland Kitchen... 1 by mouth once daily. Restart. Risks of noncompliance with treatment discussed. Compliance encouraged.  The labs were reviewed with the patient.  Her updated medication list for this problem includes:    Dyazide 37.5-25 Mg Caps (Triamterene-hctz) .Marland Kitchen... 1 by mouth q am    Atenolol 25 Mg Tabs (Atenolol) .Marland Kitchen... 1 by mouth qd   Problem # 2:  ASTHMA NOS W/ACUTE EXACERBATION (ICD-493.92) Assessment: Improved  Her updated medication list for this problem includes:    Advair Diskus 250-50 Mcg/dose Misc (Fluticasone-salmeterol) .Marland Kitchen... 1 bid    Ventolin Hfa 108 (90 Base) Mcg/act Aers (  Albuterol sulfate) .Marland Kitchen... 2 inh qid prn   Problem # 3:  LEG PAIN (ICD-729.5) l Assessment: Comment Only L menisc. surg. is pending  on 1/20  Problem # 4:  DEPRESSION (ICD-311) Assessment: Improved  Her updated medication list for this problem includes:    Fluoxetine Hcl 20 Mg Tabs (Fluoxetine hcl) .Marland Kitchen... 1 once daily by mouth    Lorazepam 0.5 Mg Tabs (Lorazepam) .Marland Kitchen... 1 by mouth at bedtime as needed insomnia   Problem # 5:  ABNORMAL GLUCOSE NEC (ICD-790.29) Assessment: Unchanged Cont w/wt loss  Complete Medication List: 1)  Advair Diskus 250-50 Mcg/dose Misc (Fluticasone-salmeterol) .Marland Kitchen.. 1 bid 2)  Ventolin Hfa 108 (90 Base) Mcg/act Aers (Albuterol sulfate) .... 2 inh qid prn 3)  Vesicare 5 Mg Tabs (Solifenacin succinate) .Marland Kitchen.. 1 by mouth once daily for bladder 4)  Vitamin D3 1000 Unit Tabs (Cholecalciferol) .Marland Kitchen.. 1 qd 5)  Aspir-low 81 Mg Tbec (Aspirin) .Marland Kitchen.. 1 once daily pc 6)   Fluoxetine Hcl 20 Mg Tabs (Fluoxetine hcl) .Marland Kitchen.. 1 once daily by mouth 7)  Fexofenadine Hcl 180 Mg Tabs (Fexofenadine hcl) .Marland Kitchen.. 1 once daily as needed allergies 8)  Dyazide 37.5-25 Mg Caps (Triamterene-hctz) .Marland Kitchen.. 1 by mouth q am 9)  Lorazepam 0.5 Mg Tabs (Lorazepam) .Marland Kitchen.. 1 by mouth at bedtime as needed insomnia 10)  Oralone 0.1 % Pste (Triamcinolone acetonide) .... Use qid as needed mouth sores 11)  Atenolol 25 Mg Tabs (Atenolol) .Marland Kitchen.. 1 by mouth qd 12)  Zolpidem Tartrate 10 Mg Tabs (Zolpidem tartrate) .... 1/2 or 1 by mouth at hs prn 13)  Omeprazole 20 Mg Cpdr (Omeprazole) .... One by mouth daily  Other Orders: H1N1 vaccine G code (D6644)   Patient Instructions: 1)  Triamt/HCT - take 1/2 tab 2)  Restart Atenolol 3)  Please schedule a follow-up appointment in 3 months. 4)  BMP prior to visit, ICD-9: 5)  Hepatic Panel prior to visit, ICD-9: 6)  Lipid Panel prior to visit, ICD-9: 7)  TSH prior to visit, ICD-9: 8)  CBC w/ Diff prior to visit, ICD-9: 401.1  995.20 9)  HbgA1C prior to visit, ICD-9:   Prescriptions: OMEPRAZOLE 20 MG CPDR (OMEPRAZOLE) one by mouth daily  #30 x 12   Entered and Authorized by:   Tresa Garter MD   Signed by:   Tresa Garter MD on 06/05/2008   Method used:   Print then Give to Patient   RxID:   [id number] ZOLPIDEM TARTRATE 10 MG  TABS (ZOLPIDEM TARTRATE) 1/2 or 1 by mouth at hs prn  #30 x 6   Entered and Authorized by:   Tresa Garter MD   Signed by:   Tresa Garter MD on 06/05/2008   Method used:   Print then Give to Patient   RxID:   [id number] VESICARE 5 MG TABS (SOLIFENACIN SUCCINATE) 1 by mouth once daily for bladder  #30 x 12   Entered and Authorized by:   Tresa Garter MD   Signed by:   Tresa Garter MD on 06/05/2008   Method used:   [facility] to        [facility] [address] [phone]* (retail)       [address]       [place], [place]  [address]       Ph: [phone]  or [phone]       Fax: [phone]   RxID:   [id number]  H1N1 # 1    Vaccine Type: H1N1 vaccine G code    Site: right deltoid    Mfr: novartis    Dose: 0.5 ml    Route: IM    Given by: Tora Perches    Exp. Date: 08/2008    Lot #: [id number]    VIS given: 02/21/2009 given June 05, 2008.

## 2010-06-23 NOTE — Assessment & Plan Note (Signed)
   Vital Signs:  Patient Profile:   77 Years Old Female Weight:      239 pounds Pulse rate:   75 / minute BP sitting:   118 / 75                 Chief Complaint:  Cough.  Acute Visit History:      Other comments include: Took Advair Never took prednisone.  Current Allergies: ENALAPRIL MALEATE  Past Medical History:    Reviewed history from 02/17/2007 and no changes required:       Allergic rhinitis       Asthma       Hyperlipidemia       Hypertension       Depression     Review of Systems       50% better   Physical Exam  General:     Well-developed,well-nourished,in no acute distress; alert,appropriate and cooperative throughout examination Nose:     External nasal examination shows no deformity or inflammation. Nasal mucosa are pink and moist without lesions or exudates. Mouth:     Oral mucosa and oropharynx without lesions or exudates.  Teeth in good repair. Neck:     No deformities, masses, or tenderness noted. Lungs:     Normal respiratory effort, chest expands symmetrically. Lungs are clear to auscultation, no crackles or wheezes. Heart:     Normal rate and regular rhythm. S1 and S2 normal without gallop, murmur, click, rub or other extra sounds. Neurologic:     No cranial nerve deficits noted. Station and gait are normal. Plantar reflexes are down-going bilaterally. DTRs are symmetrical throughout. Sensory, motor and coordinative functions appear intact.    Impression & Recommendations:  Problem # 1:  ASTHMA NOS W/ACUTE EXACERBATION (ICD-493.92) Assessment: Improved  Her updated medication list for this problem includes:    Advair Diskus 100-50 Mcg/dose Misc (Fluticasone-salmeterol) ..... Bid She will use prn    Albuterol 90 Mcg/act Aers (Albuterol) .Marland Kitchen... 2 inh qid as needed C/o hoarseness w/Advair      Complete Medication List: 1)  Simvastatin 40 Mg Tabs (Simvastatin) .... Take one tablet at bedtime 2)  Atenolol 25 Mg Tabs (Atenolol) ....  1/2 qd 3)  Aspir-low 81 Mg Tbec (Aspirin) .Marland Kitchen.. 1 once daily pc 4)  Advair Diskus 100-50 Mcg/dose Misc (Fluticasone-salmeterol) .... Bid 5)  Albuterol 90 Mcg/act Aers (Albuterol) .... 2 inh qid prn 6)  Prednisone 10 Mg Tabs (Prednisone) .... As dirr.    Patient Instructions: 1)  Please schedule a follow-up appointment in 3 months.    ]

## 2010-06-23 NOTE — Assessment & Plan Note (Signed)
Summary: 4 MO ROV /NWS $50   Vital Signs:  Patient profile:   77 year old female Weight:      233 pounds Temp:     98.4 degrees F oral Pulse rate:   72 / minute BP sitting:   122 / 94  (left arm)  Vitals Entered By: Tora Perches (April 11, 2009 8:56 AM) CC: f/u Is Patient Diabetic? No   Primary Care Provider:  Tresa Garter MD  CC:  f/u.  History of Present Illness: C/o chest pressure on L at night C/o stress and anxiety C/o depression  Preventive Screening-Counseling & Management  Alcohol-Tobacco     Smoking Status: quit  Caffeine-Diet-Exercise     Does Patient Exercise: yes  Current Medications (verified): 1)  Advair Diskus 250-50 Mcg/dose Misc (Fluticasone-Salmeterol) .Marland Kitchen.. 1 Bid 2)  Ventolin Hfa 108 (90 Base) Mcg/act  Aers (Albuterol Sulfate) .... 2 Inh Qid Prn 3)  Vitamin D3 1000 Unit  Tabs (Cholecalciferol) .Marland Kitchen.. 1 Qd 4)  Aspir-Low 81 Mg Tbec (Aspirin) .Marland Kitchen.. 1 Once Daily Pc 5)  Fluoxetine Hcl 20 Mg  Tabs (Fluoxetine Hcl) .Marland Kitchen.. 1 Once Daily By Mouth 6)  Dyazide 37.5-25 Mg Caps (Triamterene-Hctz) .Marland Kitchen.. 1 By Mouth Q Am 7)  Oralone 0.1 % Pste (Triamcinolone Acetonide) .... Use Qid As Needed Mouth Sores 8)  Atenolol 25 Mg Tabs (Atenolol) .Marland Kitchen.. 1 By Mouth Qd 9)  Zolpidem Tartrate 10 Mg  Tabs (Zolpidem Tartrate) .... 1/2 or 1 By Mouth At [facility] Prn 10)  Omeprazole 20 Mg Cpdr (Omeprazole) .... One By Mouth Daily 11)  Sanctura Xr 60 Mg Xr24h-Cap (Trospium Chloride) .Marland Kitchen.. 1 By Mouth Qd 12)  Loratadine 10 Mg  Tabs (Loratadine) .... Once Daily As Needed Allergies 13)  Lipitor 20 Mg Tabs (Atorvastatin Calcium) .... Take 1 Tab By Mouth Daily 14)  Triamcinolone Acetonide 0.5 % Crea (Triamcinolone Acetonide) .... Apply Bid To Affected Area  Allergies: 1)  Enalapril Maleate  Past History:  Past Medical History: Last updated: 06/05/2008 Allergic rhinitis Asthma Hyperlipidemia Hypertension Depression GERD Osteoarthritis Obesity Urin incont  Social  History: Retired Married Former Smoker Regular exercise-yes 2010 yogaDoes Patient Exercise:  yes  Physical Exam  General:  pleasant and cooperative, in no distress, overweight-appearing.   Nose:  no lesions Mouth:  Erythematous throat mucosa and intranasal erythema.   Lungs:  clear bilaterally, no wheezes, rhonchi, or crackles Heart:  RRR, no murmurs, rubs, or gallops Abdomen:  soft, non-tender with normal BS, no organomegaly, no masses Msk:  Lumbar-sacral spine is less tender to palpation over paraspinal muscles and painfull with the ROM  Limp Neurologic:  No cranial nerve deficits noted. Station and gait are normal. Plantar reflexes are down-going bilaterally. DTRs are symmetrical throughout. Sensory, motor and coordinative functions appear intact. Skin:  Intact without suspicious lesions or rashes Psych:  Oriented X3, not suicidal, and depressed affect.     Impression & Recommendations:  Problem # 1:  CHEST PAIN, UNSPECIFIED (ICD-786.50) atypical Assessment New  Orders: TLB-Udip ONLY (81003-UDIP) CK Total ([address]50-23250) Cardiolite (Cardiolite)  Problem # 2:  ASTHMA (ICD-493.90) Assessment: Unchanged  Her updated medication list for this problem includes:    Advair Diskus 250-50 Mcg/dose Misc (Fluticasone-salmeterol) .Marland Kitchen... 1 bid    Ventolin Hfa 108 (90 Base) Mcg/act Aers (Albuterol sulfate) .Marland Kitchen... 2 inh qid prn  Problem # 3:  OSTEOARTHRITIS (ICD-715.90) Assessment: Unchanged  Her updated medication list for this problem includes:    Aspir-low 81 Mg Tbec (Aspirin) .Marland Kitchen... 1 once daily pc  Orders:  TLB-Udip ONLY (81003-UDIP) CK Total (54098-11914)  Problem # 4:  DEPRESSION (ICD-311) Assessment: Deteriorated Risks of noncompliance with treatment discussed. Compliance encouraged.  Her updated medication list for this problem includes:    Fluoxetine Hcl 20 Mg Tabs (Fluoxetine hcl) .Marland Kitchen... 1 once daily by mouth - restart  Complete Medication List: 1)  Advair Diskus 250-50  Mcg/dose Misc (Fluticasone-salmeterol) .Marland Kitchen.. 1 bid 2)  Ventolin Hfa 108 (90 Base) Mcg/act Aers (Albuterol sulfate) .... 2 inh qid prn 3)  Vitamin D3 1000 Unit Tabs (Cholecalciferol) .Marland Kitchen.. 1 qd 4)  Aspir-low 81 Mg Tbec (Aspirin) .Marland Kitchen.. 1 once daily pc 5)  Fluoxetine Hcl 20 Mg Tabs (Fluoxetine hcl) .Marland Kitchen.. 1 once daily by mouth 6)  Dyazide 37.5-25 Mg Caps (Triamterene-hctz) .Marland Kitchen.. 1 by mouth q am 7)  Oralone 0.1 % Pste (Triamcinolone acetonide) .... Use qid as needed mouth sores 8)  Atenolol 25 Mg Tabs (Atenolol) .Marland Kitchen.. 1 by mouth qd 9)  Zolpidem Tartrate 10 Mg Tabs (Zolpidem tartrate) .... 1/2 or 1 by mouth at hs prn 10)  Omeprazole 20 Mg Cpdr (Omeprazole) .... One by mouth daily 11)  Loratadine 10 Mg Tabs (Loratadine) .... Once daily as needed allergies 12)  Lipitor 20 Mg Tabs (Atorvastatin calcium) .... Take 1 tab by mouth daily 13)  Triamcinolone Acetonide 0.5 % Crea (Triamcinolone acetonide) .... Apply bid to affected area 14)  Toviaz 4 Mg Xr24h-tab (Fesoterodine fumarate) .Marland Kitchen.. 1 by mouth qd  Other Orders: Flu Vaccine 36yrs + ([address]) Administration Flu vaccine - MCR (A2130)  Patient Instructions: 1)  Please schedule a follow-up appointment in 1 month. Prescriptions: FLUOXETINE HCL 20 MG  TABS (FLUOXETINE HCL) 1 once daily by mouth  #90 x 1   Entered and Authorized by:   Tresa Garter MD   Signed by:   Tresa Garter MD on 04/11/2009   Method used:   Print then Give to Patient   RxID:   [id number] TOVIAZ 4 MG XR24H-TAB (FESOTERODINE FUMARATE) 1 by mouth qd  #30 x 12   Entered and Authorized by:   Tresa Garter MD   Signed by:   Tresa Garter MD on 04/11/2009   Method used:   Print then Give to Patient   RxID:   [id number]    Influenza Vaccine (to be given today)      Flu Vaccine Consent Questions     Do you have a history of severe allergic reactions to this vaccine? no    Any prior history of allergic reactions to egg and/or gelatin? no    Do you  have a sensitivity to the preservative Thimersol? no    Do you have a past history of Guillan-Barre Syndrome? no    Do you currently have an acute febrile illness? no    Have you ever had a severe reaction to latex? no    Vaccine information given and explained to patient? yes    Are you currently pregnant? no    Lot Number:[id number]   Exp Date:11/20/2009   Site Given  right Deltoid IMlu

## 2010-06-23 NOTE — Progress Notes (Signed)
Summary: PA denial  Phone Note From Pharmacy   Reason for Call: Medication not on formulary Summary of Call: PA from Insurance co denied Vesicare. They stat that she must have tried and failed Sanctura, Sancutura XR, oxbutynin. Please advise Initial call taken by: Rock Nephew CMA,  July 15, 2008 8:29 AM  Follow-up for Phone Call        OK try Sanctura Follow-up by: Tresa Garter MD,  July 15, 2008 1:04 PM    New/Updated Medications: SANCTURA XR 60 MG XR24H-CAP (TROSPIUM CHLORIDE) 1 by mouth qd   Prescriptions: SANCTURA XR 60 MG XR24H-CAP (TROSPIUM CHLORIDE) 1 by mouth qd  #30 x 6   Entered and Authorized by:   Tresa Garter MD   Signed by:   Rock Nephew CMA on 07/15/2008   Method used:   [facility]        [facility] [address] [phone][facility] (retail)       [address]       [place], [place]  [address]       Ph: [phone] or [phone]       Fax: [phone]   RxID:   [id number]

## 2010-06-23 NOTE — Assessment & Plan Note (Signed)
Summary: 1 MO ROV /NWS #   Vital Signs:  Patient profile:   77 year old female Weight:      232 pounds Temp:     98.4 degrees F oral Pulse rate:   66 / minute BP sitting:   112 / 68  (left arm)  Vitals Entered By: Tora Perches (May 21, 2009 8:32 AM) CC: f/u Is Patient Diabetic? No   Primary Care Provider:  Tresa Garter MD  CC:  f/u.  History of Present Illness: The patient presents for a follow up of hypertension, elev. glu, hyperlipidemia. C/o tongue burning   Preventive Screening-Counseling & Management  Alcohol-Tobacco     Smoking Status: quit  Current Medications (verified): 1)  Advair Diskus 250-50 Mcg/dose Misc (Fluticasone-Salmeterol) .Marland Kitchen.. 1 Bid 2)  Ventolin Hfa 108 (90 Base) Mcg/act  Aers (Albuterol Sulfate) .... 2 Inh Qid Prn 3)  Vitamin D3 1000 Unit  Tabs (Cholecalciferol) .Marland Kitchen.. 1 Qd 4)  Aspir-Low 81 Mg Tbec (Aspirin) .Marland Kitchen.. 1 Once Daily Pc 5)  Fluoxetine Hcl 20 Mg  Tabs (Fluoxetine Hcl) .Marland Kitchen.. 1 Once Daily By Mouth 6)  Dyazide 37.5-25 Mg Caps (Triamterene-Hctz) .Marland Kitchen.. 1 By Mouth Q Am 7)  Oralone 0.1 % Pste (Triamcinolone Acetonide) .... Use Qid As Needed Mouth Sores 8)  Atenolol 25 Mg Tabs (Atenolol) .Marland Kitchen.. 1 By Mouth Qd 9)  Zolpidem Tartrate 10 Mg  Tabs (Zolpidem Tartrate) .... 1/2 or 1 By Mouth At [facility] Prn 10)  Omeprazole 20 Mg Cpdr (Omeprazole) .... One By Mouth Daily 11)  Loratadine 10 Mg  Tabs (Loratadine) .... Once Daily As Needed Allergies 12)  Lipitor 20 Mg Tabs (Atorvastatin Calcium) .... Take 1 Tab By Mouth Daily 13)  Triamcinolone Acetonide 0.5 % Crea (Triamcinolone Acetonide) .... Apply Bid To Affected Area 14)  Vesicare 5 Mg Tabs (Solifenacin Succinate) .Marland Kitchen.. 1 By Mouth Once Daily For Your Bladder  Allergies: 1)  Enalapril Maleate  Past History:  Past Medical History: Last updated: 06/05/2008 Allergic rhinitis Asthma Hyperlipidemia Hypertension Depression GERD Osteoarthritis Obesity Urin incont  Social History: Last updated:  04/11/2009 Retired Married Former Smoker Regular exercise-yes 2010 yoga  Review of Systems       The patient complains of dyspnea on exertion.  The patient denies fever, chest pain, syncope, and abdominal pain.         glu 112-130  Physical Exam  General:  pleasant and cooperative, in no distress, overweight-appearing.   Nose:  no lesions Mouth:  WNL  Lungs:  clear bilaterally, no wheezes, rhonchi, or crackles Heart:  RRR, no murmurs, rubs, or gallops Abdomen:  soft, non-tender with normal BS, no organomegaly, no masses Msk:  Lumbar-sacral spine is less tender to palpation over paraspinal muscles and painfull with the ROM  Limp Neurologic:  No cranial nerve deficits noted. Station and gait are normal. Plantar reflexes are down-going bilaterally. DTRs are symmetrical throughout. Sensory, motor and coordinative functions appear intact. Skin:  Intact without suspicious lesions or rashes Psych:  Oriented X3, not suicidal, and depressed affect.     Impression & Recommendations:  Problem # 1:  CHEST PAIN, UNSPECIFIED (ICD-786.50) Assessment Improved CL was nl  Problem # 2:  ABNORMAL GLUCOSE NEC (ICD-790.29) Assessment: Improved  Problem # 3:  EDEMA (ICD-782.3) Assessment: Improved  Her updated medication list for this problem includes:    Dyazide 37.5-25 Mg Caps (Triamterene-hctz) .Marland Kitchen... 1 by mouth q am  Problem # 4:  PARESTHESIA (ICD-782.0) Assessment: Unchanged See "Patient Instructions".   Complete Medication List: 1)  Advair Diskus 250-50 Mcg/dose Misc (Fluticasone-salmeterol) .Marland Kitchen.. 1 bid 2)  Ventolin Hfa 108 (90 Base) Mcg/act Aers (Albuterol sulfate) .... 2 inh qid prn 3)  Vitamin D3 1000 Unit Tabs (Cholecalciferol) .Marland Kitchen.. 1 qd 4)  Aspir-low 81 Mg Tbec (Aspirin) .Marland Kitchen.. 1 once daily pc 5)  Fluoxetine Hcl 20 Mg Tabs (Fluoxetine hcl) .Marland Kitchen.. 1 once daily by mouth 6)  Dyazide 37.5-25 Mg Caps (Triamterene-hctz) .Marland Kitchen.. 1 by mouth q am 7)  Oralone 0.1 % Pste (Triamcinolone  acetonide) .... Use qid as needed mouth sores 8)  Atenolol 25 Mg Tabs (Atenolol) .Marland Kitchen.. 1 by mouth qd 9)  Zolpidem Tartrate 10 Mg Tabs (Zolpidem tartrate) .... 1/2 or 1 by mouth at hs prn 10)  Omeprazole 20 Mg Cpdr (Omeprazole) .... One by mouth daily 11)  Loratadine 10 Mg Tabs (Loratadine) .... Once daily as needed allergies 12)  Lipitor 20 Mg Tabs (Atorvastatin calcium) .... Take 1 tab by mouth daily 13)  Triamcinolone Acetonide 0.5 % Crea (Triamcinolone acetonide) .... Apply bid to affected area 14)  Vesicare 5 Mg Tabs (Solifenacin succinate) .Marland Kitchen.. 1 by mouth once daily for your bladder  Patient Instructions: 1)  Please schedule a follow-up appointment in 1 month. 2)  BMP prior to visit, ICD-9: 3)  TSH prior to visit, ICD-9: 4)  HbgA1C prior to visit, ICD-9:790.29  782.0 5)  SPEP

## 2010-06-23 NOTE — Assessment & Plan Note (Signed)
Summary: FACIAL SWELLING--URG CARE YESTERDAY-$50-STC   Vital Signs:  Patient Profile:   77 Years Old Female Weight:      243 pounds Temp:     99.1 degrees F oral Pulse rate:   72 / minute BP sitting:   140 / 76  (left arm)  Vitals Entered By: Tora Perches (October 23, 2007 4:17 PM)                 Chief Complaint:  face swelling.  History of Present Illness: Had lips and  mouth swelling on R on Sun am. Went to [facility] on Sun night. Was given PCN.    Current Allergies (reviewed today): ENALAPRIL MALEATE  Past Medical History:    Reviewed history from 02/17/2007 and no changes required:       Allergic rhinitis       Asthma       Hyperlipidemia       Hypertension       Depression   Family History:    Reviewed history from 05/02/2007 and no changes required:       Family History Hypertension  Social History:    Reviewed history from 02/17/2007 and no changes required:       Retired       Married       Former Smoker    Review of Systems  The patient denies fever, chest pain, prolonged cough, abdominal pain, incontinence, and suspicious skin lesions.     Physical Exam  General:     overweight-appearing.   Eyes:     No corneal or conjunctival inflammation noted. EOMI. Perrla. Funduscopic exam benign, without hemorrhages, exudates or papilledema. Vision grossly normal. Ears:     External ear exam shows no significant lesions or deformities.  Otoscopic examination reveals clear canals, tympanic membranes are intact bilaterally without bulging, retraction, inflammation or discharge. Hearing is grossly normal bilaterally. Nose:     External nasal examination shows no deformity or inflammation. Nasal mucosa are pink and moist without lesions or exudates. Mouth:     No external swelling . 2-3 blisters on inner lower lip present o R, gum tender and eryth Neck:     No deformities, masses, or tenderness noted. Lungs:     Normal respiratory effort, chest expands  symmetrically. Lungs are clear to auscultation, no crackles or wheezes. Heart:     Normal rate and regular rhythm. S1 and S2 normal without gallop, murmur, click, rub or other extra sounds. Neurologic:     No cranial nerve deficits noted. Station and gait are normal. Plantar reflexes are down-going bilaterally. DTRs are symmetrical throughout. Sensory, motor and coordinative functions appear intact.    Impression & Recommendations:  Problem # 1:  DENTAL PAIN (ICD-525.9) Assessment: New Ceftin empiric  Problem # 2:  SHINGLES (ICD-053.9) Assessment: New Acyclovir x 7 d  Problem # 3:  PARESTHESIA (ICD-782.0) Assessment: New Due to # 2  Complete Medication List: 1)  Atenolol 25 Mg Tabs (Atenolol) .... 1/2 qd 2)  Advair Diskus 250-50 Mcg/dose Misc (Fluticasone-salmeterol) .Marland Kitchen.. 1 bid 3)  Ventolin Hfa 108 (90 Base) Mcg/act Aers (Albuterol sulfate) .... 2 inh qid prn 4)  Vesicare 5 Mg Tabs (Solifenacin succinate) .Marland Kitchen.. 1 by mouth once daily for bladder 5)  Vitamin D3 1000 Unit Tabs (Cholecalciferol) .Marland Kitchen.. 1 qd 6)  Aspir-low 81 Mg Tbec (Aspirin) .Marland Kitchen.. 1 once daily pc 7)  Fluoxetine Hcl 20 Mg Tabs (Fluoxetine hcl) .Marland Kitchen.. 1 once daily by mouth 8)  Fexofenadine  Hcl 180 Mg Tabs (Fexofenadine hcl) .Marland Kitchen.. 1 once daily as needed allergies 9)  Zolpidem Tartrate 10 Mg Tabs (Zolpidem tartrate) .... 1/2 or 1 by mouth at hs prn 10)  Naprosyn 500 Mg Tabs (Naproxen) .Marland Kitchen.. 1 by mouth two times a day x 2 wks, then as needed pain 11)  Acyclovir 800 Mg Tabs (Acyclovir) .Marland Kitchen.. 1 by mouth qid 12)  Ceftin 250 Mg Tabs (Cefuroxime axetil) .... Take one tablet by mouth twice a day   Patient Instructions: 1)  Please schedule a follow-up appointment in 2 weeks.   Prescriptions: CEFTIN 250 MG  TABS (CEFUROXIME AXETIL) Take one tablet by mouth twice a day  #10 x 0   Entered and Authorized by:   Tresa Garter MD   Signed by:   Tresa Garter MD on 10/23/2007   Method used:   Print then Give to Patient    RxID:   [id number] ACYCLOVIR 800 MG  TABS (ACYCLOVIR) 1 by mouth qid  #28 x 1   Entered and Authorized by:   Tresa Garter MD   Signed by:   Tresa Garter MD on 10/23/2007   Method used:   Print then Give to Patient   RxID:   [id number]  ]

## 2010-06-23 NOTE — Assessment & Plan Note (Signed)
Summary: deep cough,sore throat/no fever/$50/cd   Vital Signs:  Patient Profile:   77 Years Old Female Weight:      242 pounds Temp:     97 degrees F oral Pulse rate:   76 / minute BP sitting:   106 / 82  (left arm)  Vitals Entered By: Tora Perches (April 04, 2008 11:06 AM)                 Chief Complaint:  Multiple medical problems or concerns.  History of Present Illness: The patient presents with complaints of sore throat, fever, cough, sinus congestion and drainge of several days duration. Not better with OTC meds. Chest hurts with coughing. Can't sleep due to cough. Muscle aches are present.  The mucus is colored.      Current Allergies (reviewed today): ENALAPRIL MALEATE  Past Medical History:    Reviewed history from 02/17/2007 and no changes required:       Allergic rhinitis       Asthma       Hyperlipidemia       Hypertension       Depression   Family History:    Reviewed history from 05/02/2007 and no changes required:       Family History Hypertension  Social History:    Reviewed history from 02/17/2007 and no changes required:       Retired       Married       Former Smoker     Physical Exam  General:     overweight-appearing.   Ears:     External ear exam shows no significant lesions or deformities.  Otoscopic examination reveals clear canals, tympanic membranes are intact bilaterally without bulging, retraction, inflammation or discharge. Hearing is grossly normal bilaterally. Mouth:     Erythematous throat mucosa and intranasal erythema.   Lungs:     B wheezes Heart:     RRR Abdomen:     Bowel sounds positive,abdomen soft and non-tender without masses, organomegaly or hernias noted. Msk:     Lumbar-sacral spine is tender to palpation over paraspinal muscles and painfull with the ROM  Limp Extremities:     trace left pedal edema and trace right pedal edema.   Skin:     Intact without suspicious lesions or rashes Psych:  Cognition and judgment appear intact. Alert and cooperative with normal attention span and concentration. No apparent delusions, illusions, hallucinations    Impression & Recommendations:  Problem # 1:  BRONCHITIS, ACUTE (ICD-466.0) Assessment: New  Her updated medication list for this problem includes:    Advair Diskus 250-50 Mcg/dose Misc (Fluticasone-salmeterol) .Marland Kitchen... 1 bid    Ventolin Hfa 108 (90 Base) Mcg/act Aers (Albuterol sulfate) .Marland Kitchen... 2 inh qid prn    Promethazine-codeine 6.25-10 Mg/60ml Syrp (Promethazine-codeine) .Marland Kitchen... Take 5-57mls by mouth every 4-6 hours as needed for cough    Cefuroxime Axetil 500 Mg Tabs (Cefuroxime axetil) .Marland Kitchen... 1 by mouth 2 times daily  Orders: Depo- Medrol 40mg  (J1030) Depo- Medrol 80mg  (J1040) Admin of Therapeutic Inj  intramuscular or subcutaneous (04540)   Problem # 2:  ASTHMA NOS W/ACUTE EXACERBATION (JWJ-191.47) Assessment: Deteriorated  Her updated medication list for this problem includes:    Advair Diskus 250-50 Mcg/dose Misc (Fluticasone-salmeterol) .Marland Kitchen... 1 bid    Ventolin Hfa 108 (90 Base) Mcg/act Aers (Albuterol sulfate) .Marland Kitchen... 2 inh qid prn  Orders: Admin of Therapeutic Inj (IM or [place]) (82956)   Problem # 3:  LEG PAIN (ICD-729.5)/LBP MSK Assessment:  Deteriorated Depo medrol  Problem # 4:  VENOUS INSUFFICIENCY (ICD-459.81) Assessment: Improved  Complete Medication List: 1)  Advair Diskus 250-50 Mcg/dose Misc (Fluticasone-salmeterol) .Marland Kitchen.. 1 bid 2)  Ventolin Hfa 108 (90 Base) Mcg/act Aers (Albuterol sulfate) .... 2 inh qid prn 3)  Vesicare 5 Mg Tabs (Solifenacin succinate) .Marland Kitchen.. 1 by mouth once daily for bladder 4)  Vitamin D3 1000 Unit Tabs (Cholecalciferol) .Marland Kitchen.. 1 qd 5)  Aspir-low 81 Mg Tbec (Aspirin) .Marland Kitchen.. 1 once daily pc 6)  Fluoxetine Hcl 20 Mg Tabs (Fluoxetine hcl) .Marland Kitchen.. 1 once daily by mouth 7)  Fexofenadine Hcl 180 Mg Tabs (Fexofenadine hcl) .Marland Kitchen.. 1 once daily as needed allergies 8)  Dyazide 37.5-25 Mg Caps  (Triamterene-hctz) .Marland Kitchen.. 1 by mouth q am 9)  Lorazepam 0.5 Mg Tabs (Lorazepam) .Marland Kitchen.. 1 by mouth at bedtime as needed insomnia 10)  Oralone 0.1 % Pste (Triamcinolone acetonide) .... Use qid as needed mouth sores 11)  Atenolol 25 Mg Tabs (Atenolol) .Marland Kitchen.. 1 by mouth qd 12)  Promethazine-codeine 6.25-10 Mg/110ml Syrp (Promethazine-codeine) .... Take 5-48mls by mouth every 4-6 hours as needed for cough 13)  Cefuroxime Axetil 500 Mg Tabs (Cefuroxime axetil) .Marland Kitchen.. 1 by mouth 2 times daily  Other Orders: Gynecologic Referral (Gyn)   Patient Instructions: 1)  Use inhalers! 2)  Call if you are not better in a reasonable ammount of time or if worse. Go to ER if feeling really bad!    Prescriptions: CEFUROXIME AXETIL 500 MG  TABS (CEFUROXIME AXETIL) 1 by mouth 2 times daily  #20 x 0   Entered and Authorized by:   Tresa Garter MD   Signed by:   Tresa Garter MD on 04/04/2008   Method used:   Print then Give to Patient   RxID:   [id number] PROMETHAZINE-CODEINE 6.25-10 MG/5ML  SYRP (PROMETHAZINE-CODEINE) Take 5-28mls by mouth every 4-6 hours as needed for cough  #388mls x 0   Entered and Authorized by:   Tresa Garter MD   Signed by:   Tresa Garter MD on 04/04/2008   Method used:   Print then Give to Patient   RxID:   [id number]  ]  Medication Administration  Injection # 1:    Medication: Depo- Medrol 40mg     Diagnosis: BRONCHITIS, ACUTE (ICD-466.0)    Route: IM    Site: RUOQ gluteus    Exp Date: 03/2010    Lot #: [id number]    Mfr: sicor    Comments: 120mg  given    Patient tolerated injection without complications    Given by: Tora Perches (April 04, 2008 11:54 AM)  Injection # 2:    Medication: Depo- Medrol 80mg     Diagnosis: BRONCHITIS, ACUTE (ICD-466.0)    Route: IM    Site: RUOQ gluteus    Exp Date: 05/2009    Lot #: [id number]    Mfr: sicor    Patient tolerated injection without complications    Given by: Tora Perches (April 04, 2008  11:55 AM)  Orders Added: 1)  Est. Patient Level IV [01027] 2)  Admin of Therapeutic Inj (IM or [place]) [25366] 3)  Gynecologic Referral [Gyn] 4)  Depo- Medrol 40mg  [J1030] 5)  Depo- Medrol 80mg  [J1040] 6)  Admin of Therapeutic Inj  intramuscular or subcutaneous [44034]

## 2010-06-23 NOTE — Medication Information (Signed)
Summary: Diabetic supplies/[employer] [facility]  Diabetic supplies/[employer]   Imported By: Lester  03/08/2008 11:50:59  _____________________________________________________________________  External Attachment:    Type:   Image     Comment:   External Document

## 2010-06-23 NOTE — Progress Notes (Signed)
Summary: Nuclear pre procedure  Phone Note Outgoing Call Call back at [phone]   Call placed by: Rea College, CMA,  April 16, 2009 2:44 PM Call placed to: daughter Summary of Call: Reviewed information on [employer] Information Sheet (see scanned document for further details).  Spoke with patient's daughter d/t language barrier with patient.  Also called for interpretor to come on Monday, 04/21/09, left message with the service.  Patient unable to walk TM per daughter, changed to [place].      Nuclear Med Background Indications for Stress Test: Evaluation for Ischemia   History: Asthma   Symptoms: Chest Pressure  Symptoms Comments: h/o stress/anxiety   Nuclear Pre-Procedure Cardiac Risk Factors: History of Smoking, Hypertension, Lipids Height (in): 60

## 2010-06-23 NOTE — Medication Information (Signed)
Summary: Prior Auth [employer]  Prior Auth [employer]   Imported By: Lester Bradshaw 07/04/2008 08:27:04  _____________________________________________________________________  External Attachment:    Type:   Image     Comment:   External Document

## 2010-06-23 NOTE — Assessment & Plan Note (Signed)
Summary: 3 MO ROV /NWS  #   Primary Care Provider:  Tresa Garter MD   History of Present Illness: Skin lesion above upper lip center  Allergies: 1)  Enalapril Maleate 2)  Lipitor 3)  Simvastatin 4)  Lovastatin  Physical Exam  Skin:  2 mm AK over upper lip and 3 mm on L cheek   Impression & Recommendations:  Problem # 1:  ACTINIC KERATOSIS (ICD-702.0) Assessment New  Procedure: cryo Indication: AK(s) Risks incl. scar(s), incomplete removal, ect.  and benefits discussed     2 lesion(s) on face was/were treated with liqid N2 in usual fasion.  Tolerated well. Compl. none. Wound care instructions given.   Orders: Cryotherapy/Destruction benign or premalignant lesion (1st lesion)  (17000) Cryotherapy/Destruction benign or premalignant lesion (2nd-14th lesions) (17003)  Complete Medication List: 1)  Advair Diskus 250-50 Mcg/dose Misc (Fluticasone-salmeterol) .Marland Kitchen.. 1 bid 2)  Vitamin D3 1000 Unit Tabs (Cholecalciferol) .Marland Kitchen.. 1 qd 3)  Aspir-low 81 Mg Tbec (Aspirin) .Marland Kitchen.. 1 once daily pc 4)  Fluoxetine Hcl 20 Mg Tabs (Fluoxetine hcl) .Marland Kitchen.. 1 once daily by mouth 5)  Oralone 0.1 % Pste (Triamcinolone acetonide) .... Use qid as needed mouth sores 6)  Atenolol 25 Mg Tabs (Atenolol) .Marland Kitchen.. 1 by mouth qhs 7)  Omeprazole 20 Mg Cpdr (Omeprazole) .... One by mouth daily 8)  Loratadine 10 Mg Tabs (Loratadine) .... Once daily as needed allergies 9)  Triamcinolone Acetonide 0.5 % Crea (Triamcinolone acetonide) .... Apply bid to affected area 10)  Zolpidem Tartrate 10 Mg Tabs (Zolpidem tartrate) .... 1/2-1 tab at bedtime as needed insomnia 11)  Proair Hfa 108 (90 Base) Mcg/act Aers (Albuterol sulfate) .... 2 inh qid as needed 12)  Hydrochlorothiazide 12.5 Mg Caps (Hydrochlorothiazide) .Marland Kitchen.. 1 by mouth once daily in am for blood pressure 13)  Naproxen 500 Mg Tabs (Naproxen) .Marland Kitchen.. 1 by mouth two times a day pc for pain/arthritis 14)  Pennsaid 1.5 % Soln (Diclofenac sodium) .... 3-5 gtt on skin  three times a day for pain 15)  Alprazolam 0.5 Mg Tabs (Alprazolam) .Marland Kitchen.. 1 by mouth two times a day as needed anxiety 16)  Fluoxetine Hcl 40 Mg Caps (Fluoxetine hcl) .Marland Kitchen.. 1 by mouth qd   Orders Added: 1)  Cryotherapy/Destruction benign or premalignant lesion (1st lesion)  [17000] 2)  Cryotherapy/Destruction benign or premalignant lesion (2nd-14th lesions) [17003]

## 2010-06-23 NOTE — Medication Information (Signed)
Summary: [facility]olin[facility]  [facility]   Imported By: Sherian Rein 06/18/2009 12:10:56  _____________________________________________________________________  External Attachment:    Type:   Image     Comment:   External Document

## 2010-06-23 NOTE — Letter (Signed)
Summary: [facility] Consult Scheduled Letter  [facility]  [address] [place], [place] [address]   Phone: [phone]  Fax: [phone]      11/06/2007 MRN: [id number]  AZURI BOZARD [address] [place], [place]  [address]    Dear Ms. Cockrum,      We have scheduled an appointment for you.  At the recommendation of Dr. Posey Rea, we have scheduled you a consult with Dr. Donia Ast on November 20, 2007 at 4:00pm.  Their phone number is [phone].  If this appointment day and time is not convenient for you, please feel free to call the office of the doctor you are being referred to at the number listed above and reschedule the appointment.  Dr. Donia Ast [facility] [address] [place], [place] [address]  Thank you,  Patient Care Coordinator [facility]

## 2010-06-23 NOTE — Progress Notes (Signed)
Summary: Refill request  Phone Note Refill Request Message from:  Pharmacy on December 17, 2008 9:00 AM  Refills Requested: Medication #1:  ZOLPIDEM TARTRATE 10 MG  TABS 1/2 or 1 by mouth at hs prn  Pharmacy requesting refill. Last Office Visit w/ Dr. Posey Rea: 12-12-2008     Prescriptions: ZOLPIDEM TARTRATE 10 MG  TABS (ZOLPIDEM TARTRATE) 1/2 or 1 by mouth at hs prn  #30 x 6   Entered by:   Beola Cord, CMA   Authorized by:   Jacques Navy MD   Signed by:   Beola Cord, CMA on 12/17/2008   Method used:   Telephoned to ...       [facility]  [address] [phone]* (retail)       [address]       [place], [place]  [address]       Ph: [phone] or [phone]       Fax: [phone]   RxID:   [id number]

## 2010-06-23 NOTE — Assessment & Plan Note (Signed)
Summary: 3 mos f/u $50 cd   Vital Signs:  Patient profile:   77 year old female Weight:      231 pounds Temp:     97.4 degrees F oral Pulse rate:   76 / minute BP sitting:   136 / 100  (left arm)  Vitals Entered By: Tora Perches (December 12, 2008 8:50 AM) CC: f/u Is Patient Diabetic? No   Primary Care Provider:  Tresa Garter MD  CC:  f/u.  History of Present Illness: The patient presents for a follow up of hypertension, diabetes, hyperlipidemia, asthma. C/o LBP  Current Medications (verified): 1)  Advair Diskus 250-50 Mcg/dose Misc (Fluticasone-Salmeterol) .Marland Kitchen.. 1 Bid 2)  Ventolin Hfa 108 (90 Base) Mcg/act  Aers (Albuterol Sulfate) .... 2 Inh Qid Prn 3)  Vitamin D3 1000 Unit  Tabs (Cholecalciferol) .Marland Kitchen.. 1 Qd 4)  Aspir-Low 81 Mg Tbec (Aspirin) .Marland Kitchen.. 1 Once Daily Pc 5)  Fluoxetine Hcl 20 Mg  Tabs (Fluoxetine Hcl) .Marland Kitchen.. 1 Once Daily By Mouth 6)  Dyazide 37.5-25 Mg Caps (Triamterene-Hctz) .Marland Kitchen.. 1 By Mouth Q Am 7)  Lorazepam 0.5 Mg Tabs (Lorazepam) .Marland Kitchen.. 1 By Mouth At Bedtime As Needed Insomnia 8)  Oralone 0.1 % Pste (Triamcinolone Acetonide) .... Use Qid As Needed Mouth Sores 9)  Atenolol 25 Mg Tabs (Atenolol) .Marland Kitchen.. 1 By Mouth Qd 10)  Zolpidem Tartrate 10 Mg  Tabs (Zolpidem Tartrate) .... 1/2 or 1 By Mouth At [facility] Prn 11)  Omeprazole 20 Mg Cpdr (Omeprazole) .... One By Mouth Daily 12)  Sanctura Xr 60 Mg Xr24h-Cap (Trospium Chloride) .Marland Kitchen.. 1 By Mouth Qd 13)  Loratadine 10 Mg  Tabs (Loratadine) .... Once Daily As Needed Allergies 14)  Lipitor 20 Mg Tabs (Atorvastatin Calcium) .... Take 1 Tab By Mouth Daily 15)  Meloxicam 15 Mg Tabs (Meloxicam) .... One By Mouth Daily Pc  Allergies: 1)  Enalapril Maleate  Past History:  Past Medical History: Last updated: 06/05/2008 Allergic rhinitis Asthma Hyperlipidemia Hypertension Depression GERD Osteoarthritis Obesity Urin incont  Past Surgical History: Last updated: 02/17/2007 l knee arthroplast  Family History: Last updated:  05/02/2007 Family History Hypertension  Social History: Last updated: 02/17/2007 Retired Married Former Smoker  Review of Systems  The patient denies chest pain, syncope, dyspnea on exertion, abdominal pain, melena, and hematochezia.    Physical Exam  General:  pleasant and cooperative, in no distressoverweight-appearing.   Nose:  no lesions Mouth:  Erythematous throat mucosa and intranasal erythema.   Neck:  supple, no lymphodenopathies, no masses, no thyromegaly Lungs:  clear bilaterally, no wheezes, rhonchi, or crackles Heart:  RRR, no murmurs, rubs, or gallops Abdomen:  soft, non-tender with normal BS, no organomegaly, no masses Msk:  Lumbar-sacral spine is tender to palpation over paraspinal muscles and painfull with the ROM  Limp Neurologic:  No cranial nerve deficits noted. Station and gait are normal. Plantar reflexes are down-going bilaterally. DTRs are symmetrical throughout. Sensory, motor and coordinative functions appear intact. Skin:  Intact without suspicious lesions or rashes Psych:  Cognition and judgment appear intact. Alert and cooperative with normal attention span and concentration. No apparent delusions, illusions, hallucinations   Impression & Recommendations:  Problem # 1:  DIVERTICULITIS OF COLON (ICD-562.11) Assessment Comment Only Diiet discussed  Problem # 2:  ABNORMAL GLUCOSE NEC (ICD-790.29) Assessment: Comment Only Wt loss adviced  Problem # 3:  EDEMA (ICD-782.3) Assessment: Comment Only  Her updated medication list for this problem includes:    Dyazide 37.5-25 Mg Caps (Triamterene-hctz) .Marland Kitchen... 1 by  mouth q am  Problem # 4:  OSTEOARTHRITIS (ICD-715.90) Assessment: Deteriorated  The following medications were removed from the medication list:    Meloxicam 15 Mg Tabs (Meloxicam) ..... One by mouth daily pc Her updated medication list for this problem includes:    Aspir-low 81 Mg Tbec (Aspirin) .Marland Kitchen... 1 once daily pc  Problem # 5:   ASTHMA NOS W/ACUTE EXACERBATION (ICD-493.92) Assessment: Comment Only  Her updated medication list for this problem includes:    Advair Diskus 250-50 Mcg/dose Misc (Fluticasone-salmeterol) .Marland Kitchen... 1 bid    Ventolin Hfa 108 (90 Base) Mcg/act Aers (Albuterol sulfate) .Marland Kitchen... 2 inh qid prn  Complete Medication List: 1)  Advair Diskus 250-50 Mcg/dose Misc (Fluticasone-salmeterol) .Marland Kitchen.. 1 bid 2)  Ventolin Hfa 108 (90 Base) Mcg/act Aers (Albuterol sulfate) .... 2 inh qid prn 3)  Vitamin D3 1000 Unit Tabs (Cholecalciferol) .Marland Kitchen.. 1 qd 4)  Aspir-low 81 Mg Tbec (Aspirin) .Marland Kitchen.. 1 once daily pc 5)  Fluoxetine Hcl 20 Mg Tabs (Fluoxetine hcl) .Marland Kitchen.. 1 once daily by mouth 6)  Dyazide 37.5-25 Mg Caps (Triamterene-hctz) .Marland Kitchen.. 1 by mouth q am 7)  Oralone 0.1 % Pste (Triamcinolone acetonide) .... Use qid as needed mouth sores 8)  Atenolol 25 Mg Tabs (Atenolol) .Marland Kitchen.. 1 by mouth qd 9)  Zolpidem Tartrate 10 Mg Tabs (Zolpidem tartrate) .... 1/2 or 1 by mouth at hs prn 10)  Omeprazole 20 Mg Cpdr (Omeprazole) .... One by mouth daily 11)  Sanctura Xr 60 Mg Xr24h-cap (Trospium chloride) .Marland Kitchen.. 1 by mouth qd 12)  Loratadine 10 Mg Tabs (Loratadine) .... Once daily as needed allergies 13)  Lipitor 20 Mg Tabs (Atorvastatin calcium) .... Take 1 tab by mouth daily 14)  Triamcinolone Acetonide 0.5 % Crea (Triamcinolone acetonide) .... Apply bid to affected area  Patient Instructions: 1)  Try to eat more raw plant food, fresh and dry fruit, raw almonds, leafy vegetables, whole foods and less red meat, less animal fat. Poultry and fish is better for you than pork and beef. Avoid processed foods (canned soups, hot dogs, sausage, bacon , frozen dinners). Avoid corn syrup, high fructose syrup or aspartam and Splenda  containing drinks. Honey, Agave and Stevia are better sweeteners. Make your own  dressing with olive oil, wine vinegar, lemon juce, garlic etc. for your salads. 2)  Start taking a yoga class 3)  Please schedule a follow-up  appointment in 4 months. 4)  BMP prior to visit, ICD-9: 5)  Lipid Panel prior to visit, ICD-9:  6)  HbgA1C prior to visit, ICD-9: 250.00 995.00 Prescriptions: TRIAMCINOLONE ACETONIDE 0.5 % CREA (TRIAMCINOLONE ACETONIDE) apply bid to affected area  #45 g x 3   Entered and Authorized by:   Tresa Garter MD   Signed by:   Tresa Garter MD on 12/12/2008   Method used:   [facility]        [facility] [address] [phone]* (retail)       [address]       [place], [place]  [address]       Ph: [phone] or [phone]       Fax: [phone]   RxID:   [id number]

## 2010-06-23 NOTE — Progress Notes (Signed)
Summary: ventolin pa  Phone Note Other Incoming   Summary of Call: The office rec'd letter from patient insurance company stating that Ventolin is not covered. The preferred alternative is Proair. Would you like to change meds or initiate prior authorization? Initial call taken by: Lucious Groves,  August 22, 2009 9:35 AM  Follow-up for Phone Call        OK Proair Follow-up by: Tresa Garter MD,  August 22, 2009 1:26 PM    New/Updated Medications: PROAIR HFA 108 (90 BASE) MCG/ACT AERS (ALBUTEROL SULFATE) 2 inh qid as needed Prescriptions: PROAIR HFA 108 (90 BASE) MCG/ACT AERS (ALBUTEROL SULFATE) 2 inh qid as needed  #3 x 3   Entered and Authorized by:   Tresa Garter MD   Signed by:   Lamar Sprinkles, CMA on 08/22/2009   Method used:   [facility]ronically to        [facility] [address] [phone]* (retail)       [address]       [place], [place]  [address]       Ph: [phone] or [phone]       Fax: [phone]   RxID:   [id number]

## 2010-06-23 NOTE — Letter (Signed)
Summary: [facility] Consult Scheduled Letter  [facility]  [address] [place], [place] [address]   Phone: [phone]  Fax: [phone]      04/05/2008 MRN: [id number]  Sabrina Page [address] [place], [place]  [address]    Dear Ms. Bloomfield,      We have scheduled an appointment for you.  At the recommendation of Dr.Plotnikov, we have scheduled you a consult with Dr Rosalio Macadamia on 05/31/08 at 1:45pm.  Their phone number is [phone].  If this appointment day and time is not convenient for you, please feel free to call the office of the doctor you are being referred to at the number listed above and reschedule the appointment.     [facility] [address] [place], [place] [address]    Thank you,  Patient Care Coordinator [facility]

## 2010-06-23 NOTE — Progress Notes (Signed)
  Phone Note From Other Clinic   Summary of Call: Needs Triamt-HCTZ Initial call taken by: Tresa Garter MD,  March 12, 2009 12:45 PM  Follow-up for Phone Call        OK Follow-up by: Tresa Garter MD,  March 12, 2009 12:45 PM    Prescriptions: DYAZIDE 37.5-25 MG CAPS (TRIAMTERENE-HCTZ) 1 by mouth q am  #30 x 12   Entered and Authorized by:   Tresa Garter MD   Signed by:   Tresa Garter MD on 03/12/2009   Method used:   [facility]        [facility] [address] [phone]* (retail)       [address]       [place], [place]  [address]       Ph: [phone] or [phone]       Fax: [phone]   RxID:   [id number]

## 2010-06-23 NOTE — Assessment & Plan Note (Signed)
Summary: 4 mth fu-$50-stc   Vital Signs:  Patient Profile:   77 Years Old Female Weight:      248 pounds Temp:     97.8 degrees F oral Pulse rate:   72 / minute BP sitting:   130 / 90  (left arm)  Vitals Entered By: Tora Perches (March 04, 2008 9:59 AM)                 Chief Complaint:  Multiple medical problems or concerns.  History of Present Illness: C/o R leg swelling >l x 3 mo. C/o L shoulder pain. C/o BP elevation     Current Allergies: ENALAPRIL MALEATE  Past Medical History:    Reviewed history from 02/17/2007 and no changes required:       Allergic rhinitis       Asthma       Hyperlipidemia       Hypertension       Depression   Family History:    Reviewed history from 05/02/2007 and no changes required:       Family History Hypertension  Social History:    Reviewed history from 02/17/2007 and no changes required:       Retired       Married       Former Smoker    Review of Systems  The patient denies fever, weight loss, decreased hearing, dyspnea on exertion, headaches, hemoptysis, melena, and suspicious skin lesions.     Physical Exam  General:     overweight-appearing.   Head:     Normocephalic and atraumatic without obvious abnormalities. No apparent alopecia or balding. Ears:     External ear exam shows no significant lesions or deformities.  Otoscopic examination reveals clear canals, tympanic membranes are intact bilaterally without bulging, retraction, inflammation or discharge. Hearing is grossly normal bilaterally. Mouth:     Oral mucosa and oropharynx without lesions or exudates.  Teeth in good repair. Neck:     No deformities, masses, or tenderness noted. Lungs:     Normal respiratory effort, chest expands symmetrically. Lungs are clear to auscultation, no crackles or wheezes. Heart:     Normal rate and regular rhythm. S1 and S2 normal without gallop, murmur, click, rub or other extra sounds. Abdomen:     Bowel sounds  positive,abdomen soft and non-tender without masses, organomegaly or hernias noted. Msk:          Complete Medication List: 1)  Advair Diskus 250-50 Mcg/dose Misc (Fluticasone-salmeterol) .Marland Kitchen.. 1 bid 2)  Ventolin Hfa 108 (90 Base) Mcg/act Aers (Albuterol sulfate) .... 2 inh qid prn 3)  Vesicare 5 Mg Tabs (Solifenacin succinate) .Marland Kitchen.. 1 by mouth once daily for bladder 4)  Vitamin D3 1000 Unit Tabs (Cholecalciferol) .Marland Kitchen.. 1 qd 5)  Aspir-low 81 Mg Tbec (Aspirin) .Marland Kitchen.. 1 once daily pc 6)  Fluoxetine Hcl 20 Mg Tabs (Fluoxetine hcl) .Marland Kitchen.. 1 once daily by mouth 7)  Fexofenadine Hcl 180 Mg Tabs (Fexofenadine hcl) .Marland Kitchen.. 1 once daily as needed allergies 8)  Dyazide 37.5-25 Mg Caps (Triamterene-hctz) .Marland Kitchen.. 1 by mouth q am 9)  Lorazepam 0.5 Mg Tabs (Lorazepam) .Marland Kitchen.. 1 by mouth at bedtime as needed insomnia    Prescriptions: LORAZEPAM 0.5 MG TABS (LORAZEPAM) 1 by mouth at bedtime as needed insomnia  #30 x 6   Entered and Authorized by:   Tresa Garter MD   Signed by:   Tresa Garter MD on 03/04/2008   Method used:   Print then Give  to Patient   RxID:   [id number] DYAZIDE 37.5-25 MG CAPS (TRIAMTERENE-HCTZ) 1 by mouth q am  #30 x 12   Entered and Authorized by:   Tresa Garter MD   Signed by:   Tresa Garter MD on 03/04/2008   Method used:   Print then Give to Patient   RxID:   [id number]  ]  Appended Document: 4 mth fu-$50-stc A/P: 1. B LE edema. Get [place].Doppler US B. D/C Atenolol.          2. Flu shot         3. Insomnia. Change to LORAZEPAM          4. HTN. Starrt Diazide 1 by mouth q am          5. Obesity. Needs to loose wt. The labs were reviewd with the patient.  Appended Document: 4 mth fu-$50-stc

## 2010-06-23 NOTE — Progress Notes (Signed)
  Phone Note From Pharmacy   [facility] [address] [phone]* [phone] OV#[phone] Summary of Call: Pharm sent fax: [name] is not covered by insurance. They cover vesicare, enablex, oxytrol, or oxybutyn.  Initial call taken by: Lamar Sprinkles, CMA,  May 02, 2009 9:10 AM  Follow-up for Phone Call        OK Vesicare 5 Follow-up by: Tresa Garter MD,  May 02, 2009 9:58 AM    New/Updated Medications: VESICARE 5 MG TABS (SOLIFENACIN SUCCINATE) 1 by mouth once daily for your bladder Prescriptions: VESICARE 5 MG TABS (SOLIFENACIN SUCCINATE) 1 by mouth once daily for your bladder  #30 x 12   Entered and Authorized by:   Tresa Garter MD   Signed by:   Lamar Sprinkles, CMA on 05/02/2009   Method used:   Electronically to        [facility] [address] [phone]* (retail)       [address]       [place], [place]  [address]       Ph: [phone] or [phone]       Fax: [phone]   RxID:   [id number]

## 2010-06-23 NOTE — Assessment & Plan Note (Signed)
Summary: 3 WK ROV /NWS   Vital Signs:  Patient profile:   77 year old female Height:      64 inches Weight:      230 pounds BMI:     39.62 Temp:     98.1 degrees F oral Pulse rate:   72 / minute Pulse rhythm:   regular Resp:     16 per minute BP sitting:   130 / 70  (left arm) Cuff size:   large  Vitals Entered By: Lanier Prude, CMA(AAMA) (January 02, 2010 8:18 AM) CC: 3 wk f/u Is Patient Diabetic? No Comments pt is not taking Advair, Oralone, Pennsaid, Prednisone or Lovastatin   Primary Care Provider:  Georgina Quint Plotnikov MD  CC:  3 wk f/u.  History of Present Illness: The patient presents for a follow up of hypertension, diabetes, hyperlipidemia Comes w/dtr   Current Medications (verified): 1)  Advair Diskus 250-50 Mcg/dose Misc (Fluticasone-Salmeterol) .Marland Kitchen.. 1 Bid 2)  Vitamin D3 1000 Unit  Tabs (Cholecalciferol) .Marland Kitchen.. 1 Qd 3)  Aspir-Low 81 Mg Tbec (Aspirin) .Marland Kitchen.. 1 Once Daily Pc 4)  Fluoxetine Hcl 20 Mg  Tabs (Fluoxetine Hcl) .Marland Kitchen.. 1 Once Daily By Mouth 5)  Oralone 0.1 % Pste (Triamcinolone Acetonide) .... Use Qid As Needed Mouth Sores 6)  Atenolol 25 Mg Tabs (Atenolol) .Marland Kitchen.. 1 By Mouth Qhs 7)  Omeprazole 20 Mg Cpdr (Omeprazole) .... One By Mouth Daily 8)  Loratadine 10 Mg  Tabs (Loratadine) .... Once Daily As Needed Allergies 9)  Triamcinolone Acetonide 0.5 % Crea (Triamcinolone Acetonide) .... Apply Bid To Affected Area 10)  Zolpidem Tartrate 10 Mg Tabs (Zolpidem Tartrate) .... 1/2-1 Tab At Bedtime As Needed Insomnia 11)  Proair Hfa 108 (90 Base) Mcg/act Aers (Albuterol Sulfate) .... 2 Inh Qid As Needed 12)  Lovastatin 20 Mg Tabs (Lovastatin) .Marland Kitchen.. 1 By Mouth Once Daily For Cholesterol 13)  Hydrochlorothiazide 12.5 Mg Caps (Hydrochlorothiazide) .Marland Kitchen.. 1 By Mouth Once Daily in Am For Blood Pressure 14)  Naproxen 500 Mg Tabs (Naproxen) .Marland Kitchen.. 1 By Mouth Two Times A Day Pc For Pain/arthritis 15)  Pennsaid 1.5 % Soln (Diclofenac Sodium) .... 3-5 Gtt On Skin Three Times A Day For  Pain 16)  Prednisone 10 Mg Tabs (Prednisone) .... Take 40mg  Qd For 3 Days, Then 20 Mg Qd For 3 Days, Then 10mg  Qd For 6 Days, Then Stop. Take Pc.  Allergies (verified): 1)  Enalapril Maleate 2)  Lipitor 3)  Simvastatin 4)  Lovastatin  Past History:  Past Medical History: Last updated: 06/05/2008 Allergic rhinitis Asthma Hyperlipidemia Hypertension Depression GERD Osteoarthritis Obesity Urin incont  Past Surgical History: Last updated: 02/17/2007 l knee arthroplast  Family History: Last updated: 05/02/2007 Family History Hypertension  Social History: Last updated: 04/11/2009 Retired Married Former Smoker Regular exercise-yes 2010 yoga  Review of Systems  The patient denies fever, dyspnea on exertion, abdominal pain, and melena.    Physical Exam  General:  pleasant and cooperative, in no distress, overweight-appearing.   Eyes:  No corneal or conjunctival inflammation noted. EOMI. Perrla Ears:  External ear exam shows no significant lesions or deformities.  Otoscopic examination reveals clear canals, tympanic membranes are intact bilaterally without bulging, retraction, inflammation or discharge. Hearing is grossly normal bilaterally. Nose:  no lesions Mouth:  WNL  Neck:  supple, no lymphodenopathies, no masses, no thyromegaly Lungs:  clear bilaterally, no wheezes, rhonchi, or crackles Heart:  RRR, no murmurs, rubs, or gallops Abdomen:  soft, non-tender with normal BS, no organomegaly, no masses  Msk:  L trap is less tender L AC is tender Extremities:  trace left pedal edema and trace right pedal edema.   Neurologic:  B pos tests for CTS Skin:  Intact without suspicious lesions or rashes Psych:  Oriented X3, not suicidal, and depressed affect.     Impression & Recommendations:  Problem # 1:  NECK PAIN (ICD-723.1) MSK Assessment Improved  Her updated medication list for this problem includes:    Aspir-low 81 Mg Tbec (Aspirin) .Marland Kitchen... 1 once daily pc     Naproxen 500 Mg Tabs (Naproxen) .Marland Kitchen... 1 by mouth two times a day pc for pain/arthritis  Problem # 2:  ARM PAIN (ICD-729.5) L Assessment: Unchanged May inject if needed Pennsaid to try  Problem # 3:  DEPRESSION (ICD-311) situational Assessment: Deteriorated Discussed w/dtr and pt  Problem # 4:  HYPERTENSION (ICD-401.9) Assessment: Unchanged  Her updated medication list for this problem includes:    Atenolol 25 Mg Tabs (Atenolol) .Marland Kitchen... 1 by mouth qhs    Hydrochlorothiazide 12.5 Mg Caps (Hydrochlorothiazide) .Marland Kitchen... 1 by mouth once daily in am for blood pressure  Problem # 5:  ASTHMA (ICD-493.90) Assessment: Improved  The following medications were removed from the medication list:    Prednisone 10 Mg Tabs (Prednisone) .Marland Kitchen... Take 40mg  qd for 3 days, then 20 mg qd for 3 days, then 10mg  qd for 6 days, then stop. take pc. Her updated medication list for this problem includes:    Advair Diskus 250-50 Mcg/dose Misc (Fluticasone-salmeterol) .Marland Kitchen... 1 bid    Proair Hfa 108 (90 Base) Mcg/act Aers (Albuterol sulfate) .Marland Kitchen... 2 inh qid as needed  Problem # 6:  EDEMA (ICD-782.3) Assessment: Improved  Her updated medication list for this problem includes:    Hydrochlorothiazide 12.5 Mg Caps (Hydrochlorothiazide) .Marland Kitchen... 1 by mouth once daily in am for blood pressure  Complete Medication List: 1)  Advair Diskus 250-50 Mcg/dose Misc (Fluticasone-salmeterol) .Marland Kitchen.. 1 bid 2)  Vitamin D3 1000 Unit Tabs (Cholecalciferol) .Marland Kitchen.. 1 qd 3)  Aspir-low 81 Mg Tbec (Aspirin) .Marland Kitchen.. 1 once daily pc 4)  Fluoxetine Hcl 20 Mg Tabs (Fluoxetine hcl) .Marland Kitchen.. 1 once daily by mouth 5)  Oralone 0.1 % Pste (Triamcinolone acetonide) .... Use qid as needed mouth sores 6)  Atenolol 25 Mg Tabs (Atenolol) .Marland Kitchen.. 1 by mouth qhs 7)  Omeprazole 20 Mg Cpdr (Omeprazole) .... One by mouth daily 8)  Loratadine 10 Mg Tabs (Loratadine) .... Once daily as needed allergies 9)  Triamcinolone Acetonide 0.5 % Crea (Triamcinolone acetonide) .... Apply  bid to affected area 10)  Zolpidem Tartrate 10 Mg Tabs (Zolpidem tartrate) .... 1/2-1 tab at bedtime as needed insomnia 11)  Proair Hfa 108 (90 Base) Mcg/act Aers (Albuterol sulfate) .... 2 inh qid as needed 12)  Lovastatin 20 Mg Tabs (Lovastatin) .Marland Kitchen.. 1 by mouth once daily for cholesterol 13)  Hydrochlorothiazide 12.5 Mg Caps (Hydrochlorothiazide) .Marland Kitchen.. 1 by mouth once daily in am for blood pressure 14)  Naproxen 500 Mg Tabs (Naproxen) .Marland Kitchen.. 1 by mouth two times a day pc for pain/arthritis 15)  Pennsaid 1.5 % Soln (Diclofenac sodium) .... 3-5 gtt on skin three times a day for pain 16)  Alprazolam 0.5 Mg Tabs (Alprazolam) .Marland Kitchen.. 1 by mouth two times a day as needed anxiety  Patient Instructions: 1)  Please schedule a follow-up appointment in 3 months. 2)  BMP prior to visit, ICD-9: 3)  Hepatic Panel prior to visit, ICD-9:995.20 4)  Lipid Panel prior to visit, ICD-9: 272.20 5)  TSH prior  to visit, ICD-9: 6)  HbgA1C prior to visit, ICD-9: 7)  Vit B12 266.20 Prescriptions: ALPRAZOLAM 0.5 MG TABS (ALPRAZOLAM) 1 by mouth two times a day as needed anxiety  #60 x 3   Entered and Authorized by:   Tresa Garter MD   Signed by:   Tresa Garter MD on 01/02/2010   Method used:   Print then Give to Patient   RxID:   [id number]

## 2010-06-23 NOTE — Miscellaneous (Signed)
Summary: flu shot  Clinical Lists Changes  Orders: Added new Service order of Influenza Vaccine MCR [phone]) - Signed Observations: Added new observation of FLU VAX#1VIS: 12/15/06 version given March 04, 2008. (03/04/2008 11:02) Added new observation of FLU VAXLOT: [id number] (03/04/2008 11:02) Added new observation of FLU VAX EXP: 11/20/2008 (03/04/2008 11:02) Added new observation of FLU VAXBY: Tora Perches (03/04/2008 11:02) Added new observation of FLU VAXRTE: IM (03/04/2008 11:02) Added new observation of FLU VAX DSE: 0.5 ml (03/04/2008 11:02) Added new observation of FLU VAXMFR: Sanofi Pasteur (03/04/2008 11:02) Added new observation of FLU VAX SITE: right deltoid (03/04/2008 11:02) Added new observation of FLU VAX: Fluvax MCR (03/04/2008 11:02)      Influenza Vaccine    Vaccine Type: Fluvax MCR    Site: right deltoid    Mfr: Sanofi Pasteur    Dose: 0.5 ml    Route: IM    Given by: Tora Perches    Exp. Date: 11/20/2008    Lot #: [id number]    VIS given: 12/15/06 version given March 04, 2008.  Flu Vaccine Consent Questions    Do you have a history of severe allergic reactions to this vaccine? no    Any prior history of allergic reactions to egg and/or gelatin? no    Do you have a sensitivity to the preservative Thimersol? no    Do you have a past history of Guillan-Barre Syndrome? no    Do you currently have an acute febrile illness? no    Have you ever had a severe reaction to latex? no    Vaccine information given and explained to patient? yes    Are you currently pregnant? no

## 2010-06-23 NOTE — Assessment & Plan Note (Signed)
Summary: f/u   Vital Signs:  Patient profile:   77 year old female Height:      64 inches Weight:      223 pounds BMI:     38.42 Temp:     98.3 degrees F oral Pulse rate:   64 / minute Pulse rhythm:   regular Resp:     16 per minute BP sitting:   108 / 60  (left arm) Cuff size:   regular  Vitals Entered By: Lanier Prude, Beverly Gust) (April 08, 2010 8:18 AM) CC: f/u   Primary Care Provider:  Tresa Garter MD  CC:  f/u.  History of Present Illness: The patient presents for a follow up of asthma, hypertension, elev glu, hyperlipidemia She stopped chol med due to tongue cramps. C/o depression...worse  Current Medications (verified): 1)  Advair Diskus 250-50 Mcg/dose Misc (Fluticasone-Salmeterol) .Marland Kitchen.. 1 Bid 2)  Vitamin D3 1000 Unit  Tabs (Cholecalciferol) .Marland Kitchen.. 1 Qd 3)  Aspir-Low 81 Mg Tbec (Aspirin) .Marland Kitchen.. 1 Once Daily Pc 4)  Fluoxetine Hcl 20 Mg  Tabs (Fluoxetine Hcl) .Marland Kitchen.. 1 Once Daily By Mouth 5)  Oralone 0.1 % Pste (Triamcinolone Acetonide) .... Use Qid As Needed Mouth Sores 6)  Atenolol 25 Mg Tabs (Atenolol) .Marland Kitchen.. 1 By Mouth Qhs 7)  Omeprazole 20 Mg Cpdr (Omeprazole) .... One By Mouth Daily 8)  Loratadine 10 Mg  Tabs (Loratadine) .... Once Daily As Needed Allergies 9)  Triamcinolone Acetonide 0.5 % Crea (Triamcinolone Acetonide) .... Apply Bid To Affected Area 10)  Zolpidem Tartrate 10 Mg Tabs (Zolpidem Tartrate) .... 1/2-1 Tab At Bedtime As Needed Insomnia 11)  Proair Hfa 108 (90 Base) Mcg/act Aers (Albuterol Sulfate) .... 2 Inh Qid As Needed 12)  Lovastatin 20 Mg Tabs (Lovastatin) .Marland Kitchen.. 1 By Mouth Once Daily For Cholesterol 13)  Hydrochlorothiazide 12.5 Mg Caps (Hydrochlorothiazide) .Marland Kitchen.. 1 By Mouth Once Daily in Am For Blood Pressure 14)  Naproxen 500 Mg Tabs (Naproxen) .Marland Kitchen.. 1 By Mouth Two Times A Day Pc For Pain/arthritis 15)  Pennsaid 1.5 % Soln (Diclofenac Sodium) .... 3-5 Gtt On Skin Three Times A Day For Pain 16)  Alprazolam 0.5 Mg Tabs (Alprazolam) .Marland Kitchen.. 1 By  Mouth Two Times A Day As Needed Anxiety  Allergies (verified): 1)  Enalapril Maleate 2)  Lipitor 3)  Simvastatin 4)  Lovastatin  Past History:  Past Medical History: Last updated: 06/05/2008 Allergic rhinitis Asthma Hyperlipidemia Hypertension Depression GERD Osteoarthritis Obesity Urin incont  Social History: Last updated: 04/11/2009 Retired Married Former Smoker Regular exercise-yes 2010 yoga  Review of Systems       The patient complains of weight loss.  The patient denies fever, hoarseness, chest pain, syncope, and dyspnea on exertion.         Lost wt with teeth issues  Physical Exam  General:  pleasant and cooperative, in no distress, overweight-appearing.   Nose:  no lesions Mouth:  WNL  Neck:  supple, no lymphodenopathies, no masses, no thyromegaly Lungs:  clear bilaterally, no wheezes, rhonchi, or crackles Heart:  RRR, no murmurs, rubs, or gallops Abdomen:  soft, non-tender with normal BS, no organomegaly, no masses Msk:  WNL Neurologic:  B pos tests for CTS Skin:  Intact without suspicious lesions or rashes Psych:  Oriented X3, not suicidal, and depressed affect.     Impression & Recommendations:  Problem # 1:  INSOMNIA, CHRONIC (ICD-307.42) Assessment Unchanged On the regimen of medicine(s) reflected in the chart    Problem # 2:  OSTEOARTHRITIS (ICD-715.90) Assessment: Unchanged  Her updated medication list for this problem includes:    Aspir-low 81 Mg Tbec (Aspirin) .Marland Kitchen... 1 once daily pc    Naproxen 500 Mg Tabs (Naproxen) .Marland Kitchen... 1 by mouth two times a day pc for pain/arthritis  Problem # 3:  ABNORMAL GLUCOSE NEC (ICD-790.29) Assessment: Unchanged  Problem # 4:  HYPERTENSION (ICD-401.9) Assessment: Improved  Her updated medication list for this problem includes:    Atenolol 25 Mg Tabs (Atenolol) .Marland Kitchen... 1 by mouth qhs    Hydrochlorothiazide 12.5 Mg Caps (Hydrochlorothiazide) .Marland Kitchen... 1 by mouth once daily in am for blood pressure  Problem  # 5:  DEPRESSION (ICD-311) Assessment: Deteriorated Psych consult is offered - declined Her updated medication list for this problem includes:    Fluoxetine Hcl 20 Mg Tabs (Fluoxetine hcl) .Marland Kitchen... 1 once daily by mouth    Alprazolam 0.5 Mg Tabs (Alprazolam) .Marland Kitchen... 1 by mouth two times a day as needed anxiety    Fluoxetine Hcl 40 Mg Caps (Fluoxetine hcl) .Marland Kitchen... 1 by mouth qd  Problem # 6:  HYPERLIPIDEMIA (ICD-272.4) Assessment: Deteriorated  The following medications were removed from the medication list:    Lovastatin 20 Mg Tabs (Lovastatin) .Marland Kitchen... 1 by mouth once daily for cholesterol  Complete Medication List: 1)  Advair Diskus 250-50 Mcg/dose Misc (Fluticasone-salmeterol) .Marland Kitchen.. 1 bid 2)  Vitamin D3 1000 Unit Tabs (Cholecalciferol) .Marland Kitchen.. 1 qd 3)  Aspir-low 81 Mg Tbec (Aspirin) .Marland Kitchen.. 1 once daily pc 4)  Fluoxetine Hcl 20 Mg Tabs (Fluoxetine hcl) .Marland Kitchen.. 1 once daily by mouth 5)  Oralone 0.1 % Pste (Triamcinolone acetonide) .... Use qid as needed mouth sores 6)  Atenolol 25 Mg Tabs (Atenolol) .Marland Kitchen.. 1 by mouth qhs 7)  Omeprazole 20 Mg Cpdr (Omeprazole) .... One by mouth daily 8)  Loratadine 10 Mg Tabs (Loratadine) .... Once daily as needed allergies 9)  Triamcinolone Acetonide 0.5 % Crea (Triamcinolone acetonide) .... Apply bid to affected area 10)  Zolpidem Tartrate 10 Mg Tabs (Zolpidem tartrate) .... 1/2-1 tab at bedtime as needed insomnia 11)  Proair Hfa 108 (90 Base) Mcg/act Aers (Albuterol sulfate) .... 2 inh qid as needed 12)  Hydrochlorothiazide 12.5 Mg Caps (Hydrochlorothiazide) .Marland Kitchen.. 1 by mouth once daily in am for blood pressure 13)  Naproxen 500 Mg Tabs (Naproxen) .Marland Kitchen.. 1 by mouth two times a day pc for pain/arthritis 14)  Pennsaid 1.5 % Soln (Diclofenac sodium) .... 3-5 gtt on skin three times a day for pain 15)  Alprazolam 0.5 Mg Tabs (Alprazolam) .Marland Kitchen.. 1 by mouth two times a day as needed anxiety 16)  Fluoxetine Hcl 40 Mg Caps (Fluoxetine hcl) .Marland Kitchen.. 1 by mouth qd  Patient  Instructions: 1)  Please schedule a follow-up appointment in 6 weeks. Prescriptions: FLUOXETINE HCL 40 MG CAPS (FLUOXETINE HCL) 1 by mouth qd  #30 x 6   Entered and Authorized by:   Tresa Garter MD   Signed by:   Tresa Garter MD on 04/08/2010   Method used:   [facility] to        [facility] [address] [phone]* (retail)       [address]       [place], [place]  [address]       Ph: [phone] or [phone]       Fax: [phone]   RxID:   [id number] FLUOXETINE HCL 20 MG  TABS (FLUOXETINE HCL) 1 once daily by mouth  #90 x 1   Entered and Authorized  by:   Tresa Garter MD   Signed by:   Tresa Garter MD on 04/08/2010   Method used:   Electronically to        [facility] [address] [phone]* (retail)       [address]       [place], [place]  [address]       Ph: [phone] or [phone]       Fax: [phone]   RxID:   [id number]    Orders Added: 1)  Est. Patient Level IV [36644]

## 2010-06-23 NOTE — Miscellaneous (Signed)
Summary: [facility]  [facility]   Imported By: Sherian Rein 10/01/2009 12:07:19  _____________________________________________________________________  External Attachment:    Type:   Image     Comment:   External Document

## 2010-06-23 NOTE — Miscellaneous (Signed)
Summary: fluoxetine  Clinical Lists Changes  Medications: Rx of FLUOXETINE HCL 20 MG  TABS (FLUOXETINE HCL) 1 once daily by mouth;  #30 x 6;  Signed;  Entered by: Tora Perches;  Authorized by: Tresa Garter MD;  Method used: [facility]ically to [facility] [address] [phone]*, [address] [place], [place], [place]  [address], Ph: [phone] or [phone], Fax: [phone]    Prescriptions: FLUOXETINE HCL 20 MG  TABS (FLUOXETINE HCL) 1 once daily by mouth  #30 x 6   Entered by:   Tora Perches   Authorized by:   Tresa Garter MD   Signed by:   Tora Perches on 03/12/2008   Method used:   Electronically to        [facility] [address] [phone]* (retail)       [address]       [place], [place]  [address]       Ph: [phone] or [phone]       Fax: [phone]   RxID:   [id number]

## 2010-06-23 NOTE — Assessment & Plan Note (Signed)
Summary: RASH/PER PLOTNIKOV/JSS   Vital Signs:  Patient Profile:   77 Years Old Female Weight:      242 pounds Temp:     97.6 degrees F oral Pulse rate:   84 / minute BP sitting:   100 / 78  (left arm)  Vitals Entered By: Tora Perches (March 25, 2008 11:12 AM)                 Chief Complaint:  Multiple medical problems or concerns.  History of Present Illness: C/o painful ulcers in mouth    Current Allergies (reviewed today): ENALAPRIL MALEATE  Past Medical History:    Reviewed history from 02/17/2007 and no changes required:       Allergic rhinitis       Asthma       Hyperlipidemia       Hypertension       Depression   Family History:    Reviewed history from 05/02/2007 and no changes required:       Family History Hypertension  Social History:    Reviewed history from 02/17/2007 and no changes required:       Retired       Married       Former Smoker     Physical Exam  General:     Well-developed,well-nourished,in no acute distress; alert,appropriate and cooperative throughout examination Nose:     External nasal examination shows no deformity or inflammation. Nasal mucosa are pink and moist without lesions or exudates. Mouth:     2 mm ulcers under the tongue Lungs:     Normal respiratory effort, chest expands symmetrically. Lungs are clear to auscultation, no crackles or wheezes. Heart:     Normal rate and regular rhythm. S1 and S2 normal without gallop, murmur, click, rub or other extra sounds. Msk:     No deformity or scoliosis noted of thoracic or lumbar spine.   Pulses:     R and L carotid,radial,femoral,dorsalis pedis and posterior tibial pulses are full and equal bilaterally Extremities:     trace left pedal edema and trace right pedal edema.      Impression & Recommendations:  Problem # 1:  APHTHOUS STOMATITIS (ICD-528.2) Assessment: New Stop mouthwash use Oralone qid  Problem # 2:  EDEMA (ICD-782.3) Assessment:  Improved  Her updated medication list for this problem includes:    Dyazide 37.5-25 Mg Caps (Triamterene-hctz) .Marland Kitchen... 1 by mouth q am   Problem # 3:  HYPERTENSION (ICD-401.9)  Her updated medication list for this problem includes:    Dyazide 37.5-25 Mg Caps (Triamterene-hctz) .Marland Kitchen... 1 by mouth q am    Atenolol 25 Mg Tabs (Atenolol) .Marland Kitchen... 1 by mouth qd   Complete Medication List: 1)  Advair Diskus 250-50 Mcg/dose Misc (Fluticasone-salmeterol) .Marland Kitchen.. 1 bid 2)  Ventolin Hfa 108 (90 Base) Mcg/act Aers (Albuterol sulfate) .... 2 inh qid prn 3)  Vesicare 5 Mg Tabs (Solifenacin succinate) .Marland Kitchen.. 1 by mouth once daily for bladder 4)  Vitamin D3 1000 Unit Tabs (Cholecalciferol) .Marland Kitchen.. 1 qd 5)  Aspir-low 81 Mg Tbec (Aspirin) .Marland Kitchen.. 1 once daily pc 6)  Fluoxetine Hcl 20 Mg Tabs (Fluoxetine hcl) .Marland Kitchen.. 1 once daily by mouth 7)  Fexofenadine Hcl 180 Mg Tabs (Fexofenadine hcl) .Marland Kitchen.. 1 once daily as needed allergies 8)  Dyazide 37.5-25 Mg Caps (Triamterene-hctz) .Marland Kitchen.. 1 by mouth q am 9)  Lorazepam 0.5 Mg Tabs (Lorazepam) .Marland Kitchen.. 1 by mouth at bedtime as needed insomnia 10)  Oralone 0.1 %  Pste (Triamcinolone acetonide) .... Use qid as needed mouth sores 11)  Atenolol 25 Mg Tabs (Atenolol) .Marland Kitchen.. 1 by mouth qd   Patient Instructions: 1)  Stop mouthwash use, avoid spicy food 2)  Use baking soda solution for rinsing   Prescriptions: ATENOLOL 25 MG TABS (ATENOLOL) 1 by mouth qd  #30 x 12   Entered and Authorized by:   Tresa Garter MD   Signed by:   Tresa Garter MD on 03/25/2008   Method used:   [facility]        [facility] [address] [phone]* (retail)       [address]       [place], [place]  [address]       Ph: [phone] or [phone]       Fax: [phone]   RxID:   [id number] ORALONE 0.1 % PSTE (TRIAMCINOLONE ACETONIDE) use qid as needed mouth sores  #1 x 1   Entered and Authorized by:   Tresa Garter MD   Signed by:   Tresa Garter MD  on 03/25/2008   Method used:   Print then Give to Patient   RxID:   [id number] ORALONE 0.1 % PSTE (TRIAMCINOLONE ACETONIDE) use qid as needed mouth sores  #1 tube x 1   Entered and Authorized by:   Tresa Garter MD   Signed by:   Tresa Garter MD on 03/25/2008   Method used:   [facility]        [facility] [address] [phone]* (retail)       [address]       [place], [place]  [address]       Ph: [phone] or [phone]       Fax: [phone]   RxID:   [id number]  ]

## 2010-06-23 NOTE — Assessment & Plan Note (Signed)
Summary: 4 MO ROV/NWS  FEE   Vital Signs:  Patient Profile:   77 Years Old Female Weight:      242 pounds Temp:     97. degrees F oral Pulse rate:   70 / minute BP sitting:   128 / 71  (left arm)  Vitals Entered By: Tora Perches (November 03, 2007 9:34 AM)                 Chief Complaint:  Multiple medical problems or concerns.  History of Present Illness: The patient presents for a follow up of hypertension, depression, hyperlipidemia, shingles (resolved). C/o swelling R leg.     Current Allergies (reviewed today): ENALAPRIL MALEATE  Past Medical History:    Reviewed history from 02/17/2007 and no changes required:       Allergic rhinitis       Asthma       Hyperlipidemia       Hypertension       Depression   Family History:    Reviewed history from 05/02/2007 and no changes required:       Family History Hypertension  Social History:    Reviewed history from 02/17/2007 and no changes required:       Retired       Married       Former Smoker    Review of Systems       The patient complains of peripheral edema.  The patient denies fever, prolonged cough, hemoptysis, and severe indigestion/heartburn.     Physical Exam  General:     overweight-appearing.   Head:     Normocephalic and atraumatic without obvious abnormalities. No apparent alopecia or balding. Ears:     External ear exam shows no significant lesions or deformities.  Otoscopic examination reveals clear canals, tympanic membranes are intact bilaterally without bulging, retraction, inflammation or discharge. Hearing is grossly normal bilaterally. Nose:     External nasal examination shows no deformity or inflammation. Nasal mucosa are pink and moist without lesions or exudates. Mouth:     Oral mucosa and oropharynx without lesions or exudates.  Teeth in good repair. Neck:     No deformities, masses, or tenderness noted. Lungs:     Normal respiratory effort, chest expands symmetrically. Lungs  are clear to auscultation, no crackles or wheezes. Heart:     Normal rate and regular rhythm. S1 and S2 normal without gallop, murmur, click, rub or other extra sounds. Abdomen:     Bowel sounds positive,abdomen soft and non-tender without masses, organomegaly or hernias noted. Genitalia:     Normal introitus for age, no external lesions, no vaginal discharge, mucosa pink and moist, no vaginal or cervical lesions, no vaginal atrophy, no friaility or hemorrhage, normal uterus size and position, no adnexal masses or tenderness Msk:     No deformity or scoliosis noted of thoracic or lumbar spine.   Extremities:     trace left pedal edema and 1+ right pedal edema.   Neurologic:     No cranial nerve deficits noted. Station and gait are normal. Plantar reflexes are down-going bilaterally. DTRs are symmetrical throughout. Sensory, motor and coordinative functions appear intact. Skin:     Intact without suspicious lesions or rashes Psych:     Cognition and judgment appear intact. Alert and cooperative with normal attention span and concentration. No apparent delusions, illusions, hallucinations    Impression & Recommendations:  Problem # 1:  HYPERLIPIDEMIA (ICD-272.4) Assessment: Unchanged Not bad off Rx. Will  not restart due to leg pains on statins  Problem # 2:  HYPERTENSION (ICD-401.9) Assessment: Unchanged  Her updated medication list for this problem includes:    Atenolol 25 Mg Tabs (Atenolol) .Marland Kitchen... 1/2 qd   Problem # 3:  DEPRESSION (ICD-311) Assessment: Improved  Her updated medication list for this problem includes:    Fluoxetine Hcl 20 Mg Tabs (Fluoxetine hcl) .Marland Kitchen... 1 once daily by mouth   Problem # 4:  ALLERGIC RHINITIS (ICD-477.9) Assessment: Comment Only  Her updated medication list for this problem includes:    Fexofenadine Hcl 180 Mg Tabs (Fexofenadine hcl) .Marland Kitchen... 1 once daily as needed allergies   Problem # 5:  VENOUS INSUFFICIENCY (ICD-459.81) R LE Cons. Dr  Donia Ast Orders: Misc. Referral (Misc. Ref)   Problem # 6:  EDEMA (ICD-782.3)  Orders: Misc. Referral (Misc. Ref)   Complete Medication List: 1)  Atenolol 25 Mg Tabs (Atenolol) .... 1/2 qd 2)  Advair Diskus 250-50 Mcg/dose Misc (Fluticasone-salmeterol) .Marland Kitchen.. 1 bid 3)  Ventolin Hfa 108 (90 Base) Mcg/act Aers (Albuterol sulfate) .... 2 inh qid prn 4)  Vesicare 5 Mg Tabs (Solifenacin succinate) .Marland Kitchen.. 1 by mouth once daily for bladder 5)  Vitamin D3 1000 Unit Tabs (Cholecalciferol) .Marland Kitchen.. 1 qd 6)  Aspir-low 81 Mg Tbec (Aspirin) .Marland Kitchen.. 1 once daily pc 7)  Fluoxetine Hcl 20 Mg Tabs (Fluoxetine hcl) .Marland Kitchen.. 1 once daily by mouth 8)  Fexofenadine Hcl 180 Mg Tabs (Fexofenadine hcl) .Marland Kitchen.. 1 once daily as needed allergies 9)  Zolpidem Tartrate 10 Mg Tabs (Zolpidem tartrate) .... 1/2 or 1 by mouth at hs prn 10)  Naprosyn 500 Mg Tabs (Naproxen) .Marland Kitchen.. 1 by mouth two times a day x 2 wks, then as needed pain   Patient Instructions: 1)  Please schedule a follow-up appointment in 4 months. 2)  BMP prior to visit, ICD-9: 401.1   ]

## 2010-06-23 NOTE — Assessment & Plan Note (Signed)
Vital Signs:  Patient Profile:   77 Years Old Female Weight:      238 pounds Pulse rate:   72 / minute BP sitting:   132 / 80                 Chief Complaint:  Multiple medical problems or concerns.  History of Present Illness: SOB, weak in am  Current Allergies: ENALAPRIL MALEATE  Past Medical History:    Allergic rhinitis    Asthma    Hyperlipidemia    Hypertension    Depression  Past Surgical History:    l knee arthroplast   Social History:    Retired    Married    Former Smoker   Risk Factors:  Tobacco use:  quit   Review of Systems       The patient complains of dyspnea on exhertion.  The patient denies chest pain.     Physical Exam  General:     Well-developed,well-nourished,in no acute distress; alert,appropriate and cooperative throughout examination Head:     Normocephalic and atraumatic without obvious abnormalities. No apparent alopecia or balding. Eyes:     No corneal or conjunctival inflammation noted. EOMI. Perrla. Funduscopic exam benign, without hemorrhages, exudates or papilledema. Vision grossly normal. Ears:     External ear exam shows no significant lesions or deformities.  Otoscopic examination reveals clear canals, tympanic membranes are intact bilaterally without bulging, retraction, inflammation or discharge. Hearing is grossly normal bilaterally. Nose:     External nasal examination shows no deformity or inflammation. Nasal mucosa are pink and moist without lesions or exudates. Mouth:     Oral mucosa and oropharynx without lesions or exudates.  Teeth in good repair. Chest Wall:     No deformities, masses, or tenderness noted. Lungs:     Normal respiratory effort, chest expands symmetrically. Lungs are clear to auscultation, no crackles or wheezes. Heart:     Normal rate and regular rhythm. S1 and S2 normal without gallop, murmur, click, rub or other extra sounds. Abdomen:     Bowel sounds positive,abdomen soft and  non-tender without masses, organomegaly or hernias noted. Msk:     No deformity or scoliosis noted of thoracic or lumbar spine.   Psych:     Cognition and judgment appear intact. Alert and cooperative with normal attention span and concentration. No apparent delusions, illusions, hallucinations    Impression & Recommendations:  Problem # 1:  DYSPNEA/SHORTNESS OF BREATH (ICD-786.09) Assessment: Deteriorated  Her updated medication list for this problem includes:    Atenolol 25 Mg Tabs (Atenolol) .Marland Kitchen... 1/2 qd    Advair Diskus 100-50 Mcg/dose Misc (Fluticasone-salmeterol) ..... Bid    Albuterol 90 Mcg/act Aers (Albuterol) .Marland Kitchen... 2 inh qid prn   Problem # 2:  ASTHMA NOS W/ACUTE EXACERBATION (ICD-493.92)  Her updated medication list for this problem includes:    Advair Diskus 100-50 Mcg/dose Misc (Fluticasone-salmeterol) ..... Bid    Albuterol 90 Mcg/act Aers (Albuterol) .Marland Kitchen... 2 inh qid prn    Prednisone 10 Mg Tabs (Prednisone) .Marland Kitchen... As dirr.   Complete Medication List: 1)  Simvastatin 40 Mg Tabs (Simvastatin) .... Take one tablet at bedtime 2)  Atenolol 25 Mg Tabs (Atenolol) .... 1/2 qd 3)  Aspir-low 81 Mg Tbec (Aspirin) .Marland Kitchen.. 1 once daily pc 4)  Advair Diskus 100-50 Mcg/dose Misc (Fluticasone-salmeterol) .... Bid 5)  Albuterol 90 Mcg/act Aers (Albuterol) .... 2 inh qid prn 6)  Prednisone 10 Mg Tabs (Prednisone) .... As dirr.   Patient  Instructions: 1)  Please schedule a follow-up appointment in 2 weeks.    Prescriptions: SIMVASTATIN 40 MG TABS (SIMVASTATIN) Take one tablet at bedtime  #90 x 3   Entered and Authorized by:   Tresa Garter MD   Signed by:   Tresa Garter MD on 02/17/2007   Method used:   Print then Give to Patient   RxID:   [id number]  ]

## 2010-06-23 NOTE — Assessment & Plan Note (Signed)
Summary: 2 mos f/u // # / cd   Vital Signs:  Patient profile:   77 year old female Height:      64 inches Weight:      229.25 pounds BMI:     39.49 O2 Sat:      96 % on Room air Temp:     98.2 degrees F oral Pulse rate:   80 / minute BP sitting:   130 / 70  (left arm) Cuff size:   large  Vitals Entered By: Lucious Groves (Sep 29, 2009 10:04 AM)  O2 Flow:  Room air  Procedure Note  Injections: The patient complains of pain and tenderness. Duration of symptoms: wks Indication: chronic pain Consent signed: yes  Procedure # 1: trigger point injection    Region: dorsal    Location: L forearm, distal    Technique: 24 g needle    Medication: 20 mg depomedrol devided    Anesthesia: 2.0 ml 1% lidocaine w/o epinephrine    Comment: Risks including but not limited by incomplete procedure, bleeding, infection, recurrence were discussed with the patient. Consent form was signed. Tolerated well. Complicatons - none. Good pain relief following the procedure.   Cleaned and prepped with: alcohol and betadine Wound dressing: bandaid  CC: 2 mo f/u./kb Is Patient Diabetic? No Pain Assessment Patient in pain? no        Primary Care Provider:  Georgina Quint Plotnikov MD  CC:  2 mo f/u./kb.  History of Present Illness: The patient presents for a follow up of hypertension, diabetes, hyperlipidemia Lost wt C/o low BP after Triamt C/o L arm pain  Current Medications (verified): 1)  Advair Diskus 250-50 Mcg/dose Misc (Fluticasone-Salmeterol) .Marland Kitchen.. 1 Bid 2)  Vitamin D3 1000 Unit  Tabs (Cholecalciferol) .Marland Kitchen.. 1 Qd 3)  Aspir-Low 81 Mg Tbec (Aspirin) .Marland Kitchen.. 1 Once Daily Pc 4)  Fluoxetine Hcl 20 Mg  Tabs (Fluoxetine Hcl) .Marland Kitchen.. 1 Once Daily By Mouth 5)  Dyazide 37.5-25 Mg Caps (Triamterene-Hctz) .Marland Kitchen.. 1 By Mouth Q Am 6)  Oralone 0.1 % Pste (Triamcinolone Acetonide) .... Use Qid As Needed Mouth Sores 7)  Atenolol 25 Mg Tabs (Atenolol) .Marland Kitchen.. 1 By Mouth Qd 8)  Omeprazole 20 Mg Cpdr (Omeprazole) ....  One By Mouth Daily 9)  Loratadine 10 Mg  Tabs (Loratadine) .... Once Daily As Needed Allergies 10)  Triamcinolone Acetonide 0.5 % Crea (Triamcinolone Acetonide) .... Apply Bid To Affected Area 11)  Zolpidem Tartrate 10 Mg Tabs (Zolpidem Tartrate) .... 1/2-1 Tab At Bedtime As Needed Insomnia 12)  Proair Hfa 108 (90 Base) Mcg/act Aers (Albuterol Sulfate) .... 2 Inh Qid As Needed  Allergies (verified): 1)  Enalapril Maleate 2)  Lipitor 3)  Simvastatin  Past History:  Past Medical History: Last updated: 06/05/2008 Allergic rhinitis Asthma Hyperlipidemia Hypertension Depression GERD Osteoarthritis Obesity Urin incont  Social History: Last updated: 04/11/2009 Retired Married Former Smoker Regular exercise-yes 2010 yoga  Review of Systems       The patient complains of weight gain.  The patient denies chest pain, prolonged cough, headaches, and abdominal pain.         Tired  Physical Exam  General:  pleasant and cooperative, in no distress, overweight-appearing.   Nose:  no lesions Mouth:  WNL  Lungs:  clear bilaterally, no wheezes, rhonchi, or crackles Heart:  RRR, no murmurs, rubs, or gallops Abdomen:  soft, non-tender with normal BS, no organomegaly, no masses Msk:  tender 2 points L lat distal arm Extremities:  trace left  pedal edema and trace right pedal edema.   Neurologic:  No cranial nerve deficits noted. Station and gait are normal. Plantar reflexes are down-going bilaterally. DTRs are symmetrical throughout. Sensory, motor and coordinative functions appear intact. Skin:  Intact without suspicious lesions or rashes Psych:  Oriented X3, not suicidal, and depressed affect.     Impression & Recommendations:  Problem # 1:  ARM PAIN (ICD-729.5) L  MSK Assessment New See "Patient Instructions". Pt asked to inject  Problem # 2:  ASTHMA (ICD-493.90) Assessment: Improved  Her updated medication list for this problem includes:    Advair Diskus 250-50 Mcg/dose  Misc (Fluticasone-salmeterol) .Marland Kitchen... 1 bid    Proair Hfa 108 (90 Base) Mcg/act Aers (Albuterol sulfate) .Marland Kitchen... 2 inh qid as needed  Problem # 3:  GERD (ICD-530.81) Assessment: Improved  Her updated medication list for this problem includes:    Omeprazole 20 Mg Cpdr (Omeprazole) ..... One by mouth daily  Problem # 4:  ALLERGIC RHINITIS (ICD-477.9) Assessment: Deteriorated  Her updated medication list for this problem includes:    Loratadine 10 Mg Tabs (Loratadine) ..... Once daily as needed allergies restart  Problem # 5:  OSTEOARTHRITIS (ICD-715.90) Assessment: Unchanged  Her updated medication list for this problem includes:    Aspir-low 81 Mg Tbec (Aspirin) .Marland Kitchen... 1 once daily pc    Naproxen 500 Mg Tabs (Naproxen) .Marland Kitchen... 1 by mouth two times a day pc for pain/arthritis  Problem # 6:  VENOUS INSUFFICIENCY (ICD-459.81) Assessment: Unchanged  Complete Medication List: 1)  Advair Diskus 250-50 Mcg/dose Misc (Fluticasone-salmeterol) .Marland Kitchen.. 1 bid 2)  Vitamin D3 1000 Unit Tabs (Cholecalciferol) .Marland Kitchen.. 1 qd 3)  Aspir-low 81 Mg Tbec (Aspirin) .Marland Kitchen.. 1 once daily pc 4)  Fluoxetine Hcl 20 Mg Tabs (Fluoxetine hcl) .Marland Kitchen.. 1 once daily by mouth 5)  Oralone 0.1 % Pste (Triamcinolone acetonide) .... Use qid as needed mouth sores 6)  Atenolol 25 Mg Tabs (Atenolol) .Marland Kitchen.. 1 by mouth qd 7)  Omeprazole 20 Mg Cpdr (Omeprazole) .... One by mouth daily 8)  Loratadine 10 Mg Tabs (Loratadine) .... Once daily as needed allergies 9)  Triamcinolone Acetonide 0.5 % Crea (Triamcinolone acetonide) .... Apply bid to affected area 10)  Zolpidem Tartrate 10 Mg Tabs (Zolpidem tartrate) .... 1/2-1 tab at bedtime as needed insomnia 11)  Proair Hfa 108 (90 Base) Mcg/act Aers (Albuterol sulfate) .... 2 inh qid as needed 12)  Lovastatin 20 Mg Tabs (Lovastatin) .Marland Kitchen.. 1 by mouth once daily for cholesterol 13)  Hydrochlorothiazide 12.5 Mg Caps (Hydrochlorothiazide) .Marland Kitchen.. 1 by mouth once daily for blood pressure 14)  Naproxen 500 Mg  Tabs (Naproxen) .Marland Kitchen.. 1 by mouth two times a day pc for pain/arthritis  Other Orders: Trigger Point Injection (1 or 2 muscles) (62130) Depo-Medrol 20mg  (J1020)  Patient Instructions: 1)  Please schedule a follow-up appointment in 2 months. 2)  BMP prior to visit, ICD-9: 3)  Hepatic Panel prior to visit, ICD-9: 4)  Lipid Panel prior to visit, ICD-9: 995.20 272.0 782.0 5)  HbgA1C prior to visit, ICD-9: 6)  Vit B12 266.20 Prescriptions: LORATADINE 10 MG  TABS (LORATADINE) once daily as needed allergies  #90 x 3   Entered and Authorized by:   Tresa Garter MD   Signed by:   Tresa Garter MD on 09/29/2009   Method used:   [facility]        [facility] [address] [phone]* (retail)       [address]  [place], [place]  [address]       Ph: [phone] or [phone]       Fax: [phone]   RxID:   [id number] NAPROXEN 500 MG TABS (NAPROXEN) 1 by mouth two times a day pc for pain/arthritis  #60 x 3   Entered and Authorized by:   Tresa Garter MD   Signed by:   Tresa Garter MD on 09/29/2009   Method used:   [facility]ronically to        [facility] [address] [phone]* (retail)       [address]       [place], [place]  [address]       Ph: [phone] or [phone]       Fax: [phone]   RxID:   [id number] HYDROCHLOROTHIAZIDE 12.5 MG CAPS (HYDROCHLOROTHIAZIDE) 1 by mouth once daily for blood pressure  #30 x 12   Entered and Authorized by:   Tresa Garter MD   Signed by:   Tresa Garter MD on 09/29/2009   Method used:   Electronically to        [facility] [address] [phone]* (retail)       [address]       [place], [place]  [address]       Ph: [phone] or [phone]       Fax: [phone]   RxID:   [id number] LOVASTATIN 20 MG TABS (LOVASTATIN) 1 by mouth once daily for cholesterol  #30 x 12   Entered and Authorized by:   Tresa Garter MD    Signed by:   Tresa Garter MD on 09/29/2009   Method used:   Electronically to        [facility] [address] [phone]* (retail)       [address]       [place], [place]  [address]       Ph: [phone] or [phone]       Fax: [phone]   RxID:   [id number]

## 2010-06-23 NOTE — Progress Notes (Signed)
Summary: Rf Zolpidem  Phone Note Refill Request Message from:  Fax from Pharmacy  Refills Requested: Medication #1:  ZOLPIDEM TARTRATE 10 MG TABS 1/2-1 tab at bedtime as needed insomnia   Dosage confirmed as above?Dosage Confirmed   Supply Requested: 30   Last Refilled: 12/29/2009  Method Requested: Telephone to Pharmacy Next Appointment Scheduled: 04-08-2010  Follow-up for Phone Call        ok x6 Follow-up by: Tresa Garter MD,  February 25, 2010 5:26 PM  Additional Follow-up for Phone Call Additional follow up Details #1::        Rx called to pharmacy Additional Follow-up by: Lanier Prude, [facility]),  February 26, 2010 8:24 AM    Prescriptions: ZOLPIDEM TARTRATE 10 MG TABS (ZOLPIDEM TARTRATE) 1/2-1 tab at bedtime as needed insomnia  #30 x 6   Entered by:   Lanier Prude, [facility])   Authorized by:   Tresa Garter MD   Signed by:   Lanier Prude, CMA(AAMA) on 02/26/2010   Method used:   Telephoned to ...       [facility] [address] [phone]* (retail)       [address]       [place], [place]  [address]       Ph: [phone] or [phone]       Fax: [phone]   RxID:   [id number]

## 2010-06-23 NOTE — Medication Information (Signed)
Summary: Ventolin/[employer]  Ventolin/[employer]   Imported By: Sherian Rein 09/03/2009 14:39:58  _____________________________________________________________________  External Attachment:    Type:   Image     Comment:   External Document  Appended Document: Ventolin/[employer] Signed doc, patient is in office now.

## 2010-06-23 NOTE — Assessment & Plan Note (Signed)
Summary: 2 MO ROA/NML   Vital Signs:  Patient Profile:   77 Years Old Female Weight:      244 pounds Temp:     98.5 degrees F oral Pulse rate:   64 / minute BP sitting:   133 / 78  (left arm)  Vitals Entered By: Tora Perches (July 06, 2007 9:48 AM)             Is Patient Diabetic? No     Chief Complaint:  Multiple medical problems or concerns.  History of Present Illness: The patient presents for a follow up of hypertension, asthma,  hyperlipidemia. Breathing better after got rid of old TV. OSA is better! C/o R ankle swelling and R shoulder hurts.     Current Allergies: ENALAPRIL MALEATE  Past Medical History:    Reviewed history from 02/17/2007 and no changes required:       Allergic rhinitis       Asthma       Hyperlipidemia       Hypertension       Depression   Family History:    Reviewed history from 05/02/2007 and no changes required:       Family History Hypertension  Social History:    Reviewed history from 02/17/2007 and no changes required:       Retired       Married       Former Smoker    Review of Systems  The patient denies dyspnea on exhertion and prolonged cough.     Physical Exam  General:     Well-developed,well-nourished,in no acute distress; alert,appropriate and cooperative throughout examination Ears:     External ear exam shows no significant lesions or deformities.  Otoscopic examination reveals clear canals, tympanic membranes are intact bilaterally without bulging, retraction, inflammation or discharge. Hearing is grossly normal bilaterally. Mouth:     Oral mucosa and oropharynx without lesions or exudates.  Teeth in good repair. Neck:     No deformities, masses, or tenderness noted. Lungs:     Normal respiratory effort, chest expands symmetrically. Lungs are clear to auscultation, no crackles or wheezes. Heart:     Normal rate and regular rhythm. S1 and S2 normal without gallop, murmur, click, rub or other extra  sounds. Abdomen:     Bowel sounds positive,abdomen soft and non-tender without masses, organomegaly or hernias noted. Msk:     No deformity or scoliosis noted of thoracic or lumbar spine.   Neurologic:     No cranial nerve deficits noted. Station and gait are normal. Plantar reflexes are down-going bilaterally. DTRs are symmetrical throughout. Sensory, motor and coordinative functions appear intact. Skin:     Intact without suspicious lesions or rashes Psych:     Cognition and judgment appear intact. Alert and cooperative with normal attention span and concentration. No apparent delusions, illusions, hallucinations    Impression & Recommendations:  Problem # 1:  ASTHMA NOS W/ACUTE EXACERBATION (ICD-493.92) Assessment: Improved Was reacting to an old TV fumes - got rid of it The following medications were removed from the medication list:    Prednisone 10 Mg Tabs (Prednisone) .Marland Kitchen... As dirr.  Her updated medication list for this problem includes:    Advair Diskus 250-50 Mcg/dose Misc (Fluticasone-salmeterol) .Marland Kitchen... 1 bid    Ventolin Hfa 108 (90 Base) Mcg/act Aers (Albuterol sulfate) .Marland Kitchen... 2 inh qid prn   Problem # 2:  HYPERTENSION (ICD-401.9) Assessment: Improved  Her updated medication list for this problem includes:  Atenolol 25 Mg Tabs (Atenolol) .Marland Kitchen... 1/2 qd   Problem # 3:  DEPRESSION (ICD-311) Assessment: Improved  Her updated medication list for this problem includes:    Fluoxetine Hcl 20 Mg Tabs (Fluoxetine hcl) .Marland Kitchen... 1 once daily by mouth   Problem # 4:  HYPERLIPIDEMIA (ICD-272.4) Assessment: Unchanged  The following medications were removed from the medication list:    Simvastatin 40 Mg Tabs (Simvastatin) .Marland Kitchen... Take one tablet at bedtime   Problem # 5:  LEG PAIN (ICD-729.5) R Assessment: New Poss. OA Hold Simvastatin x 1 mo  Complete Medication List: 1)  Atenolol 25 Mg Tabs (Atenolol) .... 1/2 qd 2)  Advair Diskus 250-50 Mcg/dose Misc  (Fluticasone-salmeterol) .Marland Kitchen.. 1 bid 3)  Ventolin Hfa 108 (90 Base) Mcg/act Aers (Albuterol sulfate) .... 2 inh qid prn 4)  Vesicare 5 Mg Tabs (Solifenacin succinate) .Marland Kitchen.. 1 by mouth once daily for bladder 5)  Vitamin D3 1000 Unit Tabs (Cholecalciferol) .Marland Kitchen.. 1 qd 6)  Aspir-low 81 Mg Tbec (Aspirin) .Marland Kitchen.. 1 once daily pc 7)  Fluoxetine Hcl 20 Mg Tabs (Fluoxetine hcl) .Marland Kitchen.. 1 once daily by mouth 8)  Fexofenadine Hcl 180 Mg Tabs (Fexofenadine hcl) .Marland Kitchen.. 1 once daily as needed allergies 9)  Zolpidem Tartrate 10 Mg Tabs (Zolpidem tartrate) .... 1/2 or 1 by mouth at hs prn 10)  Naprosyn 500 Mg Tabs (Naproxen) .Marland Kitchen.. 1 by mouth two times a day x 2 wks, then as needed pain   Patient Instructions: 1)  Please schedule a follow-up appointment in 4 months.    Prescriptions: NAPROSYN 500 MG TABS (NAPROXEN) 1 by mouth two times a day x 2 wks, then as needed pain  #60 x 0   Entered and Authorized by:   Tresa Garter MD   Signed by:   Tresa Garter MD on 07/06/2007   Method used:   Print then Give to Patient   RxID:   [id number]  ]

## 2010-06-23 NOTE — Assessment & Plan Note (Signed)
Summary: 3 MO ROV /NWS $50   Vital Signs:  Patient profile:   77 year old female Height:      60 inches Weight:      235 pounds BMI:     46.06 Temp:     98.8 degrees F oral Pulse rate:   75 / minute BP sitting:   120 / 80  (left arm)  Vitals Entered By: Tora Perches (September 06, 2008 1:15 PM) CC: follow up for HTN, hyperlipidemia, dyspnea Is Patient Diabetic? No   Visit Type:  follow up Primary Care Provider:  Tresa Garter MD  CC:  follow up for HTN, hyperlipidemia, and dyspnea.  History of Present Illness: The patient is here for a follow up for HTN, hypelipidemia and dyspnea, she complains of spinning sensation in her head with ringing in ears and balance issues, she reports SOB with exertion, chest pain with exertion radiating into L arm and back, episodes are frequent, no chest pain or SOB at night, no other symptoms, no other complains.      Problems Prior to Update: 1)  Osteoarthritis  (ICD-715.90) 2)  Gerd  (ICD-530.81) 3)  Abnormal Glucose Nec  (ICD-790.29) 4)  Well Adult Exam  (ICD-V70.0) 5)  Bronchitis, Acute  (ICD-466.0) 6)  Aphthous Stomatitis  (ICD-528.2) 7)  Venous Insufficiency  (ICD-459.81) 8)  Edema  (ICD-782.3) 9)  Paresthesia  (ICD-782.0) 10)  Shingles  (ICD-053.9) 11)  Dental Pain  (ICD-525.9) 12)  Leg Pain  (ICD-729.5) 13)  Dyspnea/shortness of Breath  (ICD-786.09) 14)  Asthma Nos W/acute Exacerbation  (ICD-493.92) 15)  Dyspnea/shortness of Breath  (ICD-786.09) 16)  Symptom, Incontinence, Urge  (ICD-788.31) 17)  Depression  (ICD-311) 18)  Hypertension  (ICD-401.9) 19)  Hyperlipidemia  (ICD-272.4) 20)  Asthma  (ICD-493.90) 21)  Allergic Rhinitis  (ICD-477.9)  Medications Prior to Update: 1)  Advair Diskus 250-50 Mcg/dose Misc (Fluticasone-Salmeterol) .Marland Kitchen.. 1 Bid 2)  Ventolin Hfa 108 (90 Base) Mcg/act  Aers (Albuterol Sulfate) .... 2 Inh Qid Prn 3)  Vitamin D3 1000 Unit  Tabs (Cholecalciferol) .Marland Kitchen.. 1 Qd 4)  Aspir-Low 81 Mg Tbec  (Aspirin) .Marland Kitchen.. 1 Once Daily Pc 5)  Fluoxetine Hcl 20 Mg  Tabs (Fluoxetine Hcl) .Marland Kitchen.. 1 Once Daily By Mouth 6)  Fexofenadine Hcl 180 Mg Tabs (Fexofenadine Hcl) .Marland Kitchen.. 1 Once Daily As Needed Allergies 7)  Dyazide 37.5-25 Mg Caps (Triamterene-Hctz) .Marland Kitchen.. 1 By Mouth Q Am 8)  Lorazepam 0.5 Mg Tabs (Lorazepam) .Marland Kitchen.. 1 By Mouth At Bedtime As Needed Insomnia 9)  Oralone 0.1 % Pste (Triamcinolone Acetonide) .... Use Qid As Needed Mouth Sores 10)  Atenolol 25 Mg Tabs (Atenolol) .Marland Kitchen.. 1 By Mouth Qd 11)  Zolpidem Tartrate 10 Mg  Tabs (Zolpidem Tartrate) .... 1/2 or 1 By Mouth At [facility] Prn 12)  Omeprazole 20 Mg Cpdr (Omeprazole) .... One By Mouth Daily 13)  Sanctura Xr 60 Mg Xr24h-Cap (Trospium Chloride) .Marland Kitchen.. 1 By Mouth Qd  Current Medications (verified): 1)  Advair Diskus 250-50 Mcg/dose Misc (Fluticasone-Salmeterol) .Marland Kitchen.. 1 Bid 2)  Ventolin Hfa 108 (90 Base) Mcg/act  Aers (Albuterol Sulfate) .... 2 Inh Qid Prn 3)  Vitamin D3 1000 Unit  Tabs (Cholecalciferol) .Marland Kitchen.. 1 Qd 4)  Aspir-Low 81 Mg Tbec (Aspirin) .Marland Kitchen.. 1 Once Daily Pc 5)  Fluoxetine Hcl 20 Mg  Tabs (Fluoxetine Hcl) .Marland Kitchen.. 1 Once Daily By Mouth 6)  Fexofenadine Hcl 180 Mg Tabs (Fexofenadine Hcl) .Marland Kitchen.. 1 Once Daily As Needed Allergies 7)  Dyazide 37.5-25 Mg Caps (Triamterene-Hctz) .Marland Kitchen.. 1 By  Mouth Q Am 8)  Lorazepam 0.5 Mg Tabs (Lorazepam) .Marland Kitchen.. 1 By Mouth At Bedtime As Needed Insomnia 9)  Oralone 0.1 % Pste (Triamcinolone Acetonide) .... Use Qid As Needed Mouth Sores 10)  Atenolol 25 Mg Tabs (Atenolol) .Marland Kitchen.. 1 By Mouth Qd 11)  Zolpidem Tartrate 10 Mg  Tabs (Zolpidem Tartrate) .... 1/2 or 1 By Mouth At [facility] Prn 12)  Omeprazole 20 Mg Cpdr (Omeprazole) .... One By Mouth Daily 13)  Sanctura Xr 60 Mg Xr24h-Cap (Trospium Chloride) .Marland Kitchen.. 1 By Mouth Qd  Allergies: 1)  Enalapril Maleate  Past History:  Past Medical History:    Allergic rhinitis    Asthma    Hyperlipidemia    Hypertension    Depression    GERD    Osteoarthritis    Obesity    Urin incont      (06/05/2008)  Past Surgical History:    l knee arthroplast (02/17/2007)  Family History:    Family History Hypertension     (05/02/2007)  Social History:    Retired    Married    Former Smoker     (02/17/2007)  Risk Factors:    Alcohol Use: N/A    >5 drinks/d w/in last 3 months: N/A    Caffeine Use: N/A    Diet: N/A    Exercise: N/A  Risk Factors:    Smoking Status: quit (02/17/2007)    Packs/Day: N/A    Cigars/wk: N/A    Pipe Use/wk: N/A    Cans of tobacco/wk: N/A    Passive Smoke Exposure: N/A  Review of Systems       LLQ pain a few weeks ago, resolved  Physical Exam  General:  pleasant and cooperative, in no distress Head:  normocephalic, atraumatic, no lesions Ears:  R and L are normal, no erythema Nose:  no lesions Mouth:  Erythematous throat mucosa and intranasal erythema.   Neck:  supple, no lymphodenopathies, no masses, no thyromegaly Lungs:  clear bilaterally, no wheezes, rhonchi, or crackles Heart:  RRR, no murmurs, rubs, or gallops Abdomen:  soft, non-tender with normal BS, no organomegaly, no masses Msk:  Lumbar-sacral spine is tender to palpation over paraspinal muscles and painfull with the ROM  Limp Extremities:  non-pitting edema bilaterally, prominent vasculature bilaterally Neurologic:  No cranial nerve deficits noted. Station and gait are normal. Plantar reflexes are down-going bilaterally. DTRs are symmetrical throughout. Sensory, motor and coordinative functions appear intact.   Impression & Recommendations:  Problem # 1:  CHEST PAIN (ICD-786.50) Assessment Unchanged costocondritis most likely Ice CL offered  Problem # 2:  VENOUS INSUFFICIENCY (ICD-459.81) Assessment: Unchanged pressure stockings and elevation  Problem # 3:  HYPERLIPIDEMIA (ICD-272.4) Assessment: Improved  Her updated medication list for this problem includes:    Lipitor 20 Mg Tabs (Atorvastatin calcium) .Marland Kitchen... Take 1 tab by mouth daily  Problem # 4:   HYPERTENSION (ICD-401.9) Assessment: Unchanged  Her updated medication list for this problem includes:    Dyazide 37.5-25 Mg Caps (Triamterene-hctz) .Marland Kitchen... 1 by mouth q am    Atenolol 25 Mg Tabs (Atenolol) .Marland Kitchen... 1 by mouth qd  Problem # 5:  DIVERTICULITIS OF COLON (ICD-562.11) Assessment: Comment Only cipro if becomes symptomatic with pain and fever  Complete Medication List: 1)  Advair Diskus 250-50 Mcg/dose Misc (Fluticasone-salmeterol) .Marland Kitchen.. 1 bid 2)  Ventolin Hfa 108 (90 Base) Mcg/act Aers (Albuterol sulfate) .... 2 inh qid prn 3)  Vitamin D3 1000 Unit Tabs (Cholecalciferol) .Marland Kitchen.. 1 qd 4)  Aspir-low 81 Mg  Tbec (Aspirin) .Marland Kitchen.. 1 once daily pc 5)  Fluoxetine Hcl 20 Mg Tabs (Fluoxetine hcl) .Marland Kitchen.. 1 once daily by mouth 6)  Dyazide 37.5-25 Mg Caps (Triamterene-hctz) .Marland Kitchen.. 1 by mouth q am 7)  Lorazepam 0.5 Mg Tabs (Lorazepam) .Marland Kitchen.. 1 by mouth at bedtime as needed insomnia 8)  Oralone 0.1 % Pste (Triamcinolone acetonide) .... Use qid as needed mouth sores 9)  Atenolol 25 Mg Tabs (Atenolol) .Marland Kitchen.. 1 by mouth qd 10)  Zolpidem Tartrate 10 Mg Tabs (Zolpidem tartrate) .... 1/2 or 1 by mouth at hs prn 11)  Omeprazole 20 Mg Cpdr (Omeprazole) .... One by mouth daily 12)  Sanctura Xr 60 Mg Xr24h-cap (Trospium chloride) .Marland Kitchen.. 1 by mouth qd 13)  Loratadine 10 Mg Tabs (Loratadine) .... Once daily as needed allergies 14)  Lipitor 20 Mg Tabs (Atorvastatin calcium) .... Take 1 tab by mouth daily 15)  Meloxicam 15 Mg Tabs (Meloxicam) .... One by mouth daily pc 16)  Cipro 500 Mg Tabs (Ciprofloxacin hcl) .Marland Kitchen.. 1 by mouth 2 times daily  Patient Instructions: 1)  Please schedule a follow-up appointment in 3 months. 2)  BMP prior to visit, ICD-9: 3)  Hepatic Panel prior to visit, ICD-9: 272.0  995.20 4)  Lipid Panel prior to visit, ICD-9: 5)  CBC w/ Diff prior to visit, ICD-9: Prescriptions: CIPRO 500 MG TABS (CIPROFLOXACIN HCL) 1 by mouth 2 times daily  #20 x 0   Entered and Authorized by:   Tresa Garter  MD   Signed by:   Tresa Garter MD on 09/06/2008   Method used:   Print then Give to Patient   RxID:   [id number] MELOXICAM 15 MG TABS (MELOXICAM) one by mouth daily pc  #30 x 3   Entered and Authorized by:   Tresa Garter MD   Signed by:   Tresa Garter MD on 09/06/2008   Method used:   Print then Give to Patient   RxID:   [id number] LIPITOR 20 MG TABS (ATORVASTATIN CALCIUM) Take 1 tab by mouth daily  #90 x 3   Entered and Authorized by:   Tresa Garter MD   Signed by:   Tresa Garter MD on 09/06/2008   Method used:   Print then Give to Patient   RxID:   [id number] LORATADINE 10 MG  TABS (LORATADINE) once daily as needed allergies  #30 x 12   Entered and Authorized by:   Tresa Garter MD   Signed by:   Tresa Garter MD on 09/06/2008   Method used:   Electronically to        [facility]  [address] [phone]* (retail)       [address]       [place], [place]  [address]       Ph: [phone] or [phone]       Fax: [phone]   RxID:   [id number]

## 2010-06-23 NOTE — Assessment & Plan Note (Signed)
Summary: 3 MTH FU-STC   Vital Signs:  Patient Profile:   77 Years Old Female Weight:      242 pounds Temp:     97.3 degrees F oral Pulse rate:   64 / minute Pulse rhythm:   regular BP sitting:   130 / 80  (left arm) Cuff size:   regular  Vitals Entered By: Rock Nephew CMA (May 02, 2007 8:50 AM)                 Chief Complaint:  follow up appt.  History of Present Illness: The patient presents for a follow up of hypertension, dyspnea, asthma, hyperlipidemia. Not using Rx's    Current Allergies: ENALAPRIL MALEATE  Past Medical History:    Reviewed history from 02/17/2007 and no changes required:       Allergic rhinitis       Asthma       Hyperlipidemia       Hypertension       Depression   Family History:    Reviewed history and no changes required:       Family History Hypertension  Social History:    Reviewed history from 02/17/2007 and no changes required:       Retired       Married       Former Smoker    Review of Systems       SOB   Physical Exam  General:     Well-developed,well-nourished,in no acute distress; alert,appropriate and cooperative throughout examination Eyes:     No corneal or conjunctival inflammation noted. EOMI. Perrla. Funduscopic exam benign, without hemorrhages, exudates or papilledema. Vision grossly normal. Ears:     External ear exam shows no significant lesions or deformities.  Otoscopic examination reveals clear canals, tympanic membranes are intact bilaterally without bulging, retraction, inflammation or discharge. Hearing is grossly normal bilaterally. Mouth:     Oral mucosa and oropharynx without lesions or exudates.  Teeth in good repair. Neck:     No deformities, masses, or tenderness noted. Lungs:     Normal respiratory effort, chest expands symmetrically. Lungs are clear to auscultation, no crackles or wheezes. Heart:     Normal rate and regular rhythm. S1 and S2 normal without gallop, murmur, click,  rub or other extra sounds. Abdomen:     Bowel sounds positive,abdomen soft and non-tender without masses, organomegaly or hernias noted. Msk:     No deformity or scoliosis noted of thoracic or lumbar spine.   Neurologic:     No cranial nerve deficits noted. Station and gait are normal. Plantar reflexes are down-going bilaterally. DTRs are symmetrical throughout. Sensory, motor and coordinative functions appear intact. Skin:     Intact without suspicious lesions or rashes Psych:     slightly anxious.      Impression & Recommendations:  Problem # 1:  DYSPNEA/SHORTNESS OF BREATH (ICD-786.09) Assessment: Deteriorated .Risks of noncompliance with treatment discussed. Compliance encouraged.  Her updated medication list for this problem includes:    Atenolol 25 Mg Tabs (Atenolol) .Marland Kitchen... 1/2 qd    Advair Diskus 250-50 Mcg/dose Misc (Fluticasone-salmeterol) .Marland Kitchen... 1 bid    Ventolin Hfa 108 (90 Base) Mcg/act Aers (Albuterol sulfate) .Marland Kitchen... 2 inh qid prn    Problem # 2:  HYPERTENSION (ICD-401.9) Assessment: Unchanged  Her updated medication list for this problem includes:    Atenolol 25 Mg Tabs (Atenolol) .Marland Kitchen... 1/2 qd  Orders: TLB-A1C / Hgb A1C (Glycohemoglobin) (83036-A1C) TLB-BMP (Basic Metabolic Panel-BMET) (80048-METABOL)  TLB-Lipid Panel (80061-LIPID)   Problem # 3:  SYMPTOM, INCONTINENCE, URGE (ICD-788.31) Assessment: Deteriorated  Orders: TLB-Udip ONLY (81003-UDIP)   Problem # 4:  HYPERLIPIDEMIA (ICD-272.4) Assessment: Unchanged  Her updated medication list for this problem includes:    Simvastatin 40 Mg Tabs (Simvastatin) .Marland Kitchen... Take one tablet at bedtime  Orders: TLB-Lipid Panel (80061-LIPID)   Complete Medication List: 1)  Simvastatin 40 Mg Tabs (Simvastatin) .... Take one tablet at bedtime 2)  Atenolol 25 Mg Tabs (Atenolol) .... 1/2 qd 3)  Prednisone 10 Mg Tabs (Prednisone) .... As dirr. 4)  Advair Diskus 250-50 Mcg/dose Misc (Fluticasone-salmeterol) .Marland Kitchen.. 1  bid 5)  Ventolin Hfa 108 (90 Base) Mcg/act Aers (Albuterol sulfate) .... 2 inh qid prn 6)  Vesicare 5 Mg Tabs (Solifenacin succinate) .Marland Kitchen.. 1 by mouth once daily for bladder 7)  Vitamin D3 1000 Unit Tabs (Cholecalciferol) .Marland Kitchen.. 1 qd 8)  Aspir-low 81 Mg Tbec (Aspirin) .Marland Kitchen.. 1 once daily pc 9)  Fluoxetine Hcl 20 Mg Tabs (Fluoxetine hcl) .Marland Kitchen.. 1 once daily by mouth 10)  Fexofenadine Hcl 180 Mg Tabs (Fexofenadine hcl) .Marland Kitchen.. 1 once daily as needed allergies 11)  Zolpidem Tartrate 10 Mg Tabs (Zolpidem tartrate) .... 1/2 or 1 by mouth at hs prn   Patient Instructions: 1)  Please schedule a follow-up appointment in 2 months.    Prescriptions: ZOLPIDEM TARTRATE 10 MG  TABS (ZOLPIDEM TARTRATE) 1/2 or 1 by mouth at hs prn  #30 x 6   Entered and Authorized by:   Tresa Garter MD   Signed by:   Tresa Garter MD on 05/02/2007   Method used:   Print then Give to Patient   RxID:   [id number] FEXOFENADINE HCL 180 MG TABS (FEXOFENADINE HCL) 1 once daily as needed allergies  #30 x 12   Entered and Authorized by:   Tresa Garter MD   Signed by:   Tresa Garter MD on 05/02/2007   Method used:   Print then Give to Patient   RxID:   [id number] FLUOXETINE HCL 20 MG  TABS (FLUOXETINE HCL) 1 once daily by mouth  #30 x 12   Entered and Authorized by:   Tresa Garter MD   Signed by:   Tresa Garter MD on 05/02/2007   Method used:   Print then Give to Patient   RxID:   [id number] ATENOLOL 25 MG TABS (ATENOLOL) 1/2 qd  #30 x 12   Entered and Authorized by:   Tresa Garter MD   Signed by:   Tresa Garter MD on 05/02/2007   Method used:   Print then Give to Patient   RxID:   [id number] VESICARE 5 MG TABS (SOLIFENACIN SUCCINATE) 1 by mouth once daily for bladder  #30 x 12   Entered and Authorized by:   Tresa Garter MD   Signed by:   Tresa Garter MD on 05/02/2007   Method used:   Print then Give to Patient   RxID:    [id number] VENTOLIN HFA 108 (90 BASE) MCG/ACT  AERS (ALBUTEROL SULFATE) 2 inh qid prn  #1 x 12   Entered and Authorized by:   Tresa Garter MD   Signed by:   Tresa Garter MD on 05/02/2007   Method used:   Print then Give to Patient   RxID:   [id number] ADVAIR DISKUS 250-50 MCG/DOSE MISC (FLUTICASONE-SALMETEROL) 1 bid  #1 x 12   Entered and Authorized by:   Georgina Quint Plotnikov  MD   Signed by:   Tresa Garter MD on 05/02/2007   Method used:   Print then Give to Patient   RxID:   [id number]  ]

## 2010-06-23 NOTE — Medication Information (Signed)
Summary: Glucose supplies/[employer]  Glucose supplies/[employer]   Imported By: Lester [place] 03/04/2008 11:24:41  _____________________________________________________________________  External Attachment:    Type:   Image     Comment:   External Document

## 2010-06-23 NOTE — Assessment & Plan Note (Signed)
Summary: 1 MO ROV /NWS #   Vital Signs:  Patient profile:   77 year old female Weight:      232 pounds Temp:     97. degrees F oral Pulse rate:   86 / minute BP sitting:   164 / 92  (left arm)  Vitals Entered By: Tora Perches (July 28, 2009 3:40 PM) CC: f/u Is Patient Diabetic? No   Primary Care Provider:  Tresa Garter MD  CC:  f/u.  History of Present Illness: The patient presents for a follow up of hypertension, diabetes, hyperlipidemia, asthma Mouth sores resolved off Lipitor  Preventive Screening-Counseling & Management  Alcohol-Tobacco     Smoking Status: quit  Current Medications (verified): 1)  Advair Diskus 250-50 Mcg/dose Misc (Fluticasone-Salmeterol) .Marland Kitchen.. 1 Bid 2)  Ventolin Hfa 108 (90 Base) Mcg/act  Aers (Albuterol Sulfate) .... 2 Inh Qid Prn 3)  Vitamin D3 1000 Unit  Tabs (Cholecalciferol) .Marland Kitchen.. 1 Qd 4)  Aspir-Low 81 Mg Tbec (Aspirin) .Marland Kitchen.. 1 Once Daily Pc 5)  Fluoxetine Hcl 20 Mg  Tabs (Fluoxetine Hcl) .Marland Kitchen.. 1 Once Daily By Mouth 6)  Dyazide 37.5-25 Mg Caps (Triamterene-Hctz) .Marland Kitchen.. 1 By Mouth Q Am 7)  Oralone 0.1 % Pste (Triamcinolone Acetonide) .... Use Qid As Needed Mouth Sores 8)  Atenolol 25 Mg Tabs (Atenolol) .Marland Kitchen.. 1 By Mouth Qd 9)  Omeprazole 20 Mg Cpdr (Omeprazole) .... One By Mouth Daily 10)  Loratadine 10 Mg  Tabs (Loratadine) .... Once Daily As Needed Allergies 11)  Triamcinolone Acetonide 0.5 % Crea (Triamcinolone Acetonide) .... Apply Bid To Affected Area 12)  Vesicare 5 Mg Tabs (Solifenacin Succinate) .Marland Kitchen.. 1 By Mouth Once Daily For Your Bladder 13)  Lorazepam 0.5 Mg Tabs (Lorazepam) .Marland Kitchen.. 1 By Mouth Two Times A Day As Needed Anxiety  Allergies: 1)  Enalapril Maleate 2)  Lipitor 3)  Simvastatin  Past History:  Past Medical History: Last updated: 06/05/2008 Allergic rhinitis Asthma Hyperlipidemia Hypertension Depression GERD Osteoarthritis Obesity Urin incont  Past Surgical History: Last updated: 02/17/2007 l knee  arthroplast  Family History: Last updated: 05/02/2007 Family History Hypertension  Social History: Last updated: 04/11/2009 Retired Married Former Smoker Regular exercise-yes 2010 yoga  Review of Systems       The patient complains of dyspnea on exertion.  The patient denies weight loss, weight gain, and abdominal pain.    Physical Exam  General:  pleasant and cooperative, in no distress, overweight-appearing.   Nose:  no lesions Mouth:  WNL  Lungs:  clear bilaterally, no wheezes, rhonchi, or crackles Heart:  RRR, no murmurs, rubs, or gallops Abdomen:  soft, non-tender with normal BS, no organomegaly, no masses Msk:  Lumbar-sacral spine is less tender to palpation over paraspinal muscles and painfull with the ROM  Limp Neurologic:  No cranial nerve deficits noted. Station and gait are normal. Plantar reflexes are down-going bilaterally. DTRs are symmetrical throughout. Sensory, motor and coordinative functions appear intact. Skin:  Intact without suspicious lesions or rashes Psych:  Oriented X3, not suicidal, and depressed affect.     Impression & Recommendations:  Problem # 1:  OSTEOARTHRITIS (ICD-715.90) Assessment Unchanged  Her updated medication list for this problem includes:    Aspir-low 81 Mg Tbec (Aspirin) .Marland Kitchen... 1 once daily pc  Problem # 2:  ABNORMAL GLUCOSE NEC (ICD-790.29) Assessment: Improved The labs were reviewed with the patient.   Problem # 3:  ALLERGIC RHINITIS (ICD-477.9) Assessment: Deteriorated  Her updated medication list for this problem includes:  Loratadine 10 Mg Tabs (Loratadine) ..... Once daily as needed allergies  Problem # 4:  ASTHMA (ICD-493.90) Assessment: Unchanged  Her updated medication list for this problem includes:    Advair Diskus 250-50 Mcg/dose Misc (Fluticasone-salmeterol) .Marland Kitchen... 1 bid    Ventolin Hfa 108 (90 Base) Mcg/act Aers (Albuterol sulfate) .Marland Kitchen... 2 inh qid prn  Complete Medication List: 1)  Advair Diskus  250-50 Mcg/dose Misc (Fluticasone-salmeterol) .Marland Kitchen.. 1 bid 2)  Ventolin Hfa 108 (90 Base) Mcg/act Aers (Albuterol sulfate) .... 2 inh qid prn 3)  Vitamin D3 1000 Unit Tabs (Cholecalciferol) .Marland Kitchen.. 1 qd 4)  Aspir-low 81 Mg Tbec (Aspirin) .Marland Kitchen.. 1 once daily pc 5)  Fluoxetine Hcl 20 Mg Tabs (Fluoxetine hcl) .Marland Kitchen.. 1 once daily by mouth 6)  Dyazide 37.5-25 Mg Caps (Triamterene-hctz) .Marland Kitchen.. 1 by mouth q am 7)  Oralone 0.1 % Pste (Triamcinolone acetonide) .... Use qid as needed mouth sores 8)  Atenolol 25 Mg Tabs (Atenolol) .Marland Kitchen.. 1 by mouth qd 9)  Omeprazole 20 Mg Cpdr (Omeprazole) .... One by mouth daily 10)  Loratadine 10 Mg Tabs (Loratadine) .... Once daily as needed allergies 11)  Triamcinolone Acetonide 0.5 % Crea (Triamcinolone acetonide) .... Apply bid to affected area 12)  Zolpidem Tartrate 10 Mg Tabs (Zolpidem tartrate) .... 1/2-1 tab at bedtime as needed insomnia  Patient Instructions: 1)  Please schedule a follow-up appointment in 2 months. 2)  BMP prior to visit, ICD-9: 3)  Hepatic Panel prior to visit, ICD-9: 4)  TSH prior to visit, ICD-9: 5)  HbgA1C prior to visit, ICD-9: 272.20 995.20 6)  Hold Lipitor x 1 month Prescriptions: DYAZIDE 37.5-25 MG CAPS (TRIAMTERENE-HCTZ) 1 by mouth q am  #90 x 1   Entered and Authorized by:   Tresa Garter MD   Signed by:   Tresa Garter MD on 07/28/2009   Method used:   Print then Give to Patient   RxID:   [id number] FLUOXETINE HCL 20 MG  TABS (FLUOXETINE HCL) 1 once daily by mouth  #90 x 1   Entered and Authorized by:   Tresa Garter MD   Signed by:   Tresa Garter MD on 07/28/2009   Method used:   Print then Give to Patient   RxID:   [id number] ZOLPIDEM TARTRATE 10 MG TABS (ZOLPIDEM TARTRATE) 1/2-1 tab at bedtime as needed insomnia  #30 x 6   Entered and Authorized by:   Tresa Garter MD   Signed by:   Tresa Garter MD on 07/28/2009   Method used:   Print then Give to Patient   RxID:    [id number]

## 2010-06-23 NOTE — Progress Notes (Signed)
Summary: advair  Phone Note Other Incoming Call back at fax   [id number] Details for Reason: request refill on advair 100-50 Details of Action Taken: ok x 12 refills/vg Initial call taken by: Tora Perches,  June 13, 2008 8:20 AM      Prescriptions: ADVAIR DISKUS 250-50 MCG/DOSE MISC (FLUTICASONE-SALMETEROL) 1 bid  #1 x 12   Entered by:   Tora Perches   Authorized by:   Tresa Garter MD   Signed by:   Tora Perches on 06/13/2008   Method used:   [facility]        [facility] [address] [phone]* (retail)       [address]       [place], [place]  [address]       Ph: [phone] or [phone]       Fax: [phone]   RxID:   [id number]

## 2010-06-23 NOTE — Assessment & Plan Note (Signed)
Summary: 1 mo rov / # NWS   Vital Signs:  Patient profile:   77 year old female Weight:      233 pounds Temp:     97.6 degrees F oral Pulse rate:   89 / minute BP sitting:   100 / 74  (left arm)  Vitals Entered By: Tora Perches (June 23, 2009 9:01 AM) CC: f/u Is Patient Diabetic? No   Primary Care Provider:  Tresa Garter MD  CC:  f/u.  History of Present Illness: The patient presents for a follow up of hypertension, asthma, hyperlipidemia.   Preventive Screening-Counseling & Management  Alcohol-Tobacco     Smoking Status: quit  Current Medications (verified): 1)  Advair Diskus 250-50 Mcg/dose Misc (Fluticasone-Salmeterol) .Marland Kitchen.. 1 Bid 2)  Ventolin Hfa 108 (90 Base) Mcg/act  Aers (Albuterol Sulfate) .... 2 Inh Qid Prn 3)  Vitamin D3 1000 Unit  Tabs (Cholecalciferol) .Marland Kitchen.. 1 Qd 4)  Aspir-Low 81 Mg Tbec (Aspirin) .Marland Kitchen.. 1 Once Daily Pc 5)  Fluoxetine Hcl 20 Mg  Tabs (Fluoxetine Hcl) .Marland Kitchen.. 1 Once Daily By Mouth 6)  Dyazide 37.5-25 Mg Caps (Triamterene-Hctz) .Marland Kitchen.. 1 By Mouth Q Am 7)  Oralone 0.1 % Pste (Triamcinolone Acetonide) .... Use Qid As Needed Mouth Sores 8)  Atenolol 25 Mg Tabs (Atenolol) .Marland Kitchen.. 1 By Mouth Qd 9)  Zolpidem Tartrate 10 Mg  Tabs (Zolpidem Tartrate) .... 1/2 or 1 By Mouth At [facility] Prn 10)  Omeprazole 20 Mg Cpdr (Omeprazole) .... One By Mouth Daily 11)  Loratadine 10 Mg  Tabs (Loratadine) .... Once Daily As Needed Allergies 12)  Lipitor 20 Mg Tabs (Atorvastatin Calcium) .... Take 1 Tab By Mouth Daily 13)  Triamcinolone Acetonide 0.5 % Crea (Triamcinolone Acetonide) .... Apply Bid To Affected Area 14)  Vesicare 5 Mg Tabs (Solifenacin Succinate) .Marland Kitchen.. 1 By Mouth Once Daily For Your Bladder  Allergies: 1)  Enalapril Maleate 2)  Lipitor  Past History:  Past Medical History: Last updated: 06/05/2008 Allergic rhinitis Asthma Hyperlipidemia Hypertension Depression GERD Osteoarthritis Obesity Urin incont  Social History: Last updated:  04/11/2009 Retired Married Former Smoker Regular exercise-yes 2010 yoga  Physical Exam  General:  pleasant and cooperative, in no distress, overweight-appearing.   Nose:  no lesions Mouth:  WNL  Lungs:  clear bilaterally, no wheezes, rhonchi, or crackles Heart:  RRR, no murmurs, rubs, or gallops Abdomen:  soft, non-tender with normal BS, no organomegaly, no masses Msk:  Lumbar-sacral spine is less tender to palpation over paraspinal muscles and painfull with the ROM  Limp Neurologic:  No cranial nerve deficits noted. Station and gait are normal. Plantar reflexes are down-going bilaterally. DTRs are symmetrical throughout. Sensory, motor and coordinative functions appear intact. Skin:  Intact without suspicious lesions or rashes Psych:  Oriented X3, not suicidal, and depressed affect.     Impression & Recommendations:  Problem # 1:  GERD (ICD-530.81) Assessment Deteriorated D/c Lipitor Her updated medication list for this problem includes:    Omeprazole 20 Mg Cpdr (Omeprazole) ..... One by mouth daily  Problem # 2:  CHEST PAIN (ICD-786.50) MSK Assessment: Improved  Problem # 3:  HYPERTENSION (ICD-401.9) Assessment: Improved  Her updated medication list for this problem includes:    Dyazide 37.5-25 Mg Caps (Triamterene-hctz) .Marland Kitchen... 1 by mouth q am    Atenolol 25 Mg Tabs (Atenolol) .Marland Kitchen... 1 by mouth qd  BP today: 100/74 Prior BP: 112/68 (05/21/2009)  Labs Reviewed: K+: 3.9 (06/18/2009) Creat: : 0.8 (06/18/2009)   Chol: 183 (04/04/2009)  HDL: 44.00 (04/04/2009)   LDL: 107 (04/04/2009)   TG: 159.0 (04/04/2009)  Problem # 4:  OSTEOARTHRITIS (ICD-715.90) Assessment: Unchanged  Her updated medication list for this problem includes:    Aspir-low 81 Mg Tbec (Aspirin) .Marland Kitchen... 1 once daily pc  Problem # 5:  DEPRESSION (ICD-311) Assessment: Improved  Her updated medication list for this problem includes:    Fluoxetine Hcl 20 Mg Tabs (Fluoxetine hcl) .Marland Kitchen... 1 once daily by  mouth    Lorazepam 0.5 Mg Tabs (Lorazepam) .Marland Kitchen... 1 by mouth two times a day as needed anxiety  Problem # 6:  ASTHMA (ICD-493.90) Assessment: Improved  Her updated medication list for this problem includes:    Advair Diskus 250-50 Mcg/dose Misc (Fluticasone-salmeterol) .Marland Kitchen... 1 bid    Ventolin Hfa 108 (90 Base) Mcg/act Aers (Albuterol sulfate) .Marland Kitchen... 2 inh qid prn  Problem # 7:  INSOMNIA, CHRONIC (ICD-307.42) Assessment: Unchanged Try Lorazepam  Complete Medication List: 1)  Advair Diskus 250-50 Mcg/dose Misc (Fluticasone-salmeterol) .Marland Kitchen.. 1 bid 2)  Ventolin Hfa 108 (90 Base) Mcg/act Aers (Albuterol sulfate) .... 2 inh qid prn 3)  Vitamin D3 1000 Unit Tabs (Cholecalciferol) .Marland Kitchen.. 1 qd 4)  Aspir-low 81 Mg Tbec (Aspirin) .Marland Kitchen.. 1 once daily pc 5)  Fluoxetine Hcl 20 Mg Tabs (Fluoxetine hcl) .Marland Kitchen.. 1 once daily by mouth 6)  Dyazide 37.5-25 Mg Caps (Triamterene-hctz) .Marland Kitchen.. 1 by mouth q am 7)  Oralone 0.1 % Pste (Triamcinolone acetonide) .... Use qid as needed mouth sores 8)  Atenolol 25 Mg Tabs (Atenolol) .Marland Kitchen.. 1 by mouth qd 9)  Omeprazole 20 Mg Cpdr (Omeprazole) .... One by mouth daily 10)  Loratadine 10 Mg Tabs (Loratadine) .... Once daily as needed allergies 11)  Triamcinolone Acetonide 0.5 % Crea (Triamcinolone acetonide) .... Apply bid to affected area 12)  Vesicare 5 Mg Tabs (Solifenacin succinate) .Marland Kitchen.. 1 by mouth once daily for your bladder 13)  Lorazepam 0.5 Mg Tabs (Lorazepam) .Marland Kitchen.. 1 by mouth two times a day as needed anxiety  Patient Instructions: 1)  Please schedule a follow-up appointment in 1 month. Prescriptions: LORAZEPAM 0.5 MG TABS (LORAZEPAM) 1 by mouth two times a day as needed anxiety  #60 x 3   Entered and Authorized by:   Tresa Garter MD   Signed by:   Tresa Garter MD on 06/23/2009   Method used:   Print then Give to Patient   RxID:   [id number]

## 2010-06-23 NOTE — Assessment & Plan Note (Signed)
Summary: 2 MTH FU  STC   Vital Signs:  Patient profile:   77 year old female Height:      64 inches (162.56 cm) Weight:      230 pounds (104.55 kg) BMI:     39.62 O2 Sat:      92 % on Room air Temp:     98.6 degrees F (37.00 degrees C) oral Pulse rate:   74 / minute Pulse rhythm:   regular Resp:     16 per minute BP sitting:   128 / 86  (left arm) Cuff size:   regular  Vitals Entered By: Lanier Prude, CMA(AAMA) (November 28, 2009 10:14 AM)  O2 Flow:  Room air CC: 2 mo f/u Is Patient Diabetic? No   Primary Care Provider:  Tresa Garter MD  CC:  2 mo f/u.  History of Present Illness: C/o L neck, L arm and hand pain and L 3d finger hurts at times x 1-2 h The patient presents for a follow up of hypertension, diabetes, hyperlipidemia BP 150/100 -was  forgetting to take atenolol C/o B hands going numb at night  Current Medications (verified): 1)  Advair Diskus 250-50 Mcg/dose Misc (Fluticasone-Salmeterol) .Marland Kitchen.. 1 Bid 2)  Vitamin D3 1000 Unit  Tabs (Cholecalciferol) .Marland Kitchen.. 1 Qd 3)  Aspir-Low 81 Mg Tbec (Aspirin) .Marland Kitchen.. 1 Once Daily Pc 4)  Fluoxetine Hcl 20 Mg  Tabs (Fluoxetine Hcl) .Marland Kitchen.. 1 Once Daily By Mouth 5)  Oralone 0.1 % Pste (Triamcinolone Acetonide) .... Use Qid As Needed Mouth Sores 6)  Atenolol 25 Mg Tabs (Atenolol) .Marland Kitchen.. 1 By Mouth Qd 7)  Omeprazole 20 Mg Cpdr (Omeprazole) .... One By Mouth Daily 8)  Loratadine 10 Mg  Tabs (Loratadine) .... Once Daily As Needed Allergies 9)  Triamcinolone Acetonide 0.5 % Crea (Triamcinolone Acetonide) .... Apply Bid To Affected Area 10)  Zolpidem Tartrate 10 Mg Tabs (Zolpidem Tartrate) .... 1/2-1 Tab At Bedtime As Needed Insomnia 11)  Proair Hfa 108 (90 Base) Mcg/act Aers (Albuterol Sulfate) .... 2 Inh Qid As Needed 12)  Lovastatin 20 Mg Tabs (Lovastatin) .Marland Kitchen.. 1 By Mouth Once Daily For Cholesterol 13)  Hydrochlorothiazide 12.5 Mg Caps (Hydrochlorothiazide) .Marland Kitchen.. 1 By Mouth Once Daily For Blood Pressure 14)  Naproxen 500 Mg Tabs  (Naproxen) .Marland Kitchen.. 1 By Mouth Two Times A Day Pc For Pain/arthritis  Allergies (verified): 1)  Enalapril Maleate 2)  Lipitor 3)  Simvastatin  Past History:  Past Medical History: Last updated: 06/05/2008 Allergic rhinitis Asthma Hyperlipidemia Hypertension Depression GERD Osteoarthritis Obesity Urin incont  Past Surgical History: Last updated: 02/17/2007 l knee arthroplast  Family History: Last updated: 05/02/2007 Family History Hypertension  Social History: Last updated: 04/11/2009 Retired Married Former Smoker Regular exercise-yes 2010 yoga  Review of Systems       The patient complains of depression.  The patient denies fever, weight loss, weight gain, chest pain, dyspnea on exertion, abdominal pain, and melena.    Physical Exam  General:  pleasant and cooperative, in no distress, overweight-appearing.   Nose:  no lesions Mouth:  WNL  Lungs:  clear bilaterally, no wheezes, rhonchi, or crackles Heart:  RRR, no murmurs, rubs, or gallops Abdomen:  soft, non-tender with normal BS, no organomegaly, no masses Msk:  L trap is very tender Extremities:  trace left pedal edema and trace right pedal edema.   Neurologic:  B pos tests for CTS Skin:  Intact without suspicious lesions or rashes Psych:  Oriented X3, not suicidal, and depressed affect.  Impression & Recommendations:  Problem # 1:  NECK PAIN (ICD-723.1) L - poss radiculopathy Assessment Deteriorated We can try to inj trigger points Her updated medication list for this problem includes:    Aspir-low 81 Mg Tbec (Aspirin) .Marland Kitchen... 1 once daily pc    Naproxen 500 Mg Tabs (Naproxen) .Marland Kitchen... 1 by mouth two times a day pc for pain/arthritis  Orders: T-Cervicle Spine 2-3 Views (16109UE)  Problem # 2:  ARM PAIN (ICD-729.5) L related to #1 Assessment: Deteriorated  Orders: T-Cervicle Spine 2-3 Views (72040TC)  Problem # 3:  CARPAL TUNNEL SYNDROME (ICD-354.0) B Assessment: New  Problem # 4:  VENOUS  INSUFFICIENCY (ICD-459.81) Assessment: Unchanged  Problem # 5:  DEPRESSION (ICD-311) Assessment: Comment Only Risks of noncompliance with treatment discussed. Compliance encouraged.  Her updated medication list for this problem includes:    Fluoxetine Hcl 20 Mg Tabs (Fluoxetine hcl) .Marland Kitchen... 1 once daily by mouth  Problem # 6:  ASTHMA (ICD-493.90) Assessment: Improved  Her updated medication list for this problem includes:    Advair Diskus 250-50 Mcg/dose Misc (Fluticasone-salmeterol) .Marland Kitchen... 1 bid    Proair Hfa 108 (90 Base) Mcg/act Aers (Albuterol sulfate) .Marland Kitchen... 2 inh qid as needed    Prednisone 10 Mg Tabs (Prednisone) .Marland Kitchen... Take 40mg  qd for 3 days, then 20 mg qd for 3 days, then 10mg  qd for 6 days, then stop. take pc.  Complete Medication List: 1)  Advair Diskus 250-50 Mcg/dose Misc (Fluticasone-salmeterol) .Marland Kitchen.. 1 bid 2)  Vitamin D3 1000 Unit Tabs (Cholecalciferol) .Marland Kitchen.. 1 qd 3)  Aspir-low 81 Mg Tbec (Aspirin) .Marland Kitchen.. 1 once daily pc 4)  Fluoxetine Hcl 20 Mg Tabs (Fluoxetine hcl) .Marland Kitchen.. 1 once daily by mouth 5)  Oralone 0.1 % Pste (Triamcinolone acetonide) .... Use qid as needed mouth sores 6)  Atenolol 25 Mg Tabs (Atenolol) .Marland Kitchen.. 1 by mouth qhs 7)  Omeprazole 20 Mg Cpdr (Omeprazole) .... One by mouth daily 8)  Loratadine 10 Mg Tabs (Loratadine) .... Once daily as needed allergies 9)  Triamcinolone Acetonide 0.5 % Crea (Triamcinolone acetonide) .... Apply bid to affected area 10)  Zolpidem Tartrate 10 Mg Tabs (Zolpidem tartrate) .... 1/2-1 tab at bedtime as needed insomnia 11)  Proair Hfa 108 (90 Base) Mcg/act Aers (Albuterol sulfate) .... 2 inh qid as needed 12)  Lovastatin 20 Mg Tabs (Lovastatin) .Marland Kitchen.. 1 by mouth once daily for cholesterol 13)  Hydrochlorothiazide 12.5 Mg Caps (Hydrochlorothiazide) .Marland Kitchen.. 1 by mouth once daily in am for blood pressure 14)  Naproxen 500 Mg Tabs (Naproxen) .Marland Kitchen.. 1 by mouth two times a day pc for pain/arthritis 15)  Pennsaid 1.5 % Soln (Diclofenac sodium) .... 3-5  gtt on skin three times a day for pain 16)  Prednisone 10 Mg Tabs (Prednisone) .... Take 40mg  qd for 3 days, then 20 mg qd for 3 days, then 10mg  qd for 6 days, then stop. take pc.  Other Orders: Splints- All Types (A5409) Splints- All Types (W1191)  Patient Instructions: 1)  Please schedule a follow-up appointment in 2 weeks or 3 mo . 2)  BMP prior to visit, ICD-9: 3)  PTH 995.20 Prescriptions: PREDNISONE 10 MG TABS (PREDNISONE) Take 40mg  qd for 3 days, then 20 mg qd for 3 days, then 10mg  qd for 6 days, then stop. Take pc.  #24 x 1   Entered and Authorized by:   Tresa Garter MD   Signed by:   Tresa Garter MD on 11/28/2009   Method used:   Print then Give to  Patient   RxID:   [id number] PENNSAID 1.5 % SOLN (DICLOFENAC SODIUM) 3-5 gtt on skin three times a day for pain  #1 x 3   Entered and Authorized by:   Tresa Garter MD   Signed by:   Tresa Garter MD on 11/28/2009   Method used:   Print then Give to Patient   RxID:   [id number]

## 2010-06-23 NOTE — Progress Notes (Signed)
Summary: Ventolin HFA  Phone Note From Pharmacy   [facility] [address] [phone]* Summary of Call: New prescription fax form was rec'd for Ventolin HFA, signed by MD and faxed back to pharmacy above. Sent to scanning. Initial call taken by: Lucious Groves,  June 16, 2009 9:17 AM

## 2010-06-23 NOTE — Assessment & Plan Note (Signed)
Summary: flu vaccine/SD  Nurse Visit   Vital Signs:  Patient profile:   77 year old female Temp:     97.2 degrees F  Allergies: 1)  Enalapril Maleate 2)  Lipitor 3)  Simvastatin 4)  Lovastatin  Orders Added: 1)  Flu Vaccine 63yrs + MEDICARE PATIENTS [Q2039] 2)  Administration Flu vaccine - MCR [G0008]    Flu Vaccine Consent Questions     Do you have a history of severe allergic reactions to this vaccine? no    Any prior history of allergic reactions to egg and/or gelatin? no    Do you have a sensitivity to the preservative Thimersol? no    Do you have a past history of Guillan-Barre Syndrome? no    Do you currently have an acute febrile illness? no    Have you ever had a severe reaction to latex? no    Vaccine information given and explained to patient? yes    Are you currently pregnant? no    Lot Number:[id number]   Exp Date:11/21/2010   Site Given  Left Deltoid IMu

## 2010-06-23 NOTE — Medication Information (Signed)
Summary: Diabetic Supplies/[facility]  Diabetic Supplies/[facility]   Imported By: Lanelle Bal 02/23/2008 12:44:19  _____________________________________________________________________  External Attachment:    Type:   Image     Comment:   External Document

## 2010-06-23 NOTE — Assessment & Plan Note (Signed)
Summary: Cardiology Nuclear Study  Nuclear Med Background Indications for Stress Test: Evaluation for Ischemia   History: Asthma, Heart Catheterization  History Comments:  ~5 yrs. ago Cath:OK per patient  Symptoms: Chest Pressure, Chest Pressure with Exertion, Dizziness, DOE, Palpitations  Symptoms Comments: h/o stress/anxiety   Nuclear Pre-Procedure Cardiac Risk Factors: History of Smoking, Hypertension, Lipids, Obesity Caffeine/Decaff Intake: None NPO After: 8:00 PM Lungs: Clear IV 0.9% NS with Angio Cath: 22g     IV Site: (L) AC IV Started by: Irean Hong RN Chest Size (in) 44     Cup Size DD+     Height (in): 64 Weight (lb): 230 BMI: 39.62 Tech Comments: Interpreter present. The patient has walked a treadmill in the past, and  wants to do the treadmill vs pharmological. P. Edwards,RN.  Nuclear Med Study 1 or 2 day study:  1 day     Stress Test Type:  Stress Reading MD:  Marca Ancona, MD     Referring MD:  Sonda Primes, MD Resting Radionuclide:  Technetium 58m Tetrofosmin     Resting Radionuclide Dose:  10.6 mCi  Stress Radionuclide:  Technetium 63m Tetrofosmin     Stress Radionuclide Dose:  33 mCi   Stress Protocol Exercise Time (min):  3:31 min     Max HR:  130 bpm     Predicted Max HR:  147 bpm  Max Systolic BP: 218 mm Hg     Percent Max HR:  88.44 %     METS: 4.7 Rate Pressure Product:  19147    Stress Test Technologist:  Rea [employer] CMA-N     Nuclear Technologist:  Domenic Polite CNMT  Rest Procedure  Myocardial perfusion imaging was performed at rest 45 minutes following the intravenous administration of Myoview Technetium 28m Tetrofosmin.  Stress Procedure  The patient exercised for 3:31.  The patient stopped due to fatigue and denied any chest pain.  There were no diagnostic ST-T wave changes.  She did have a hypertenisve response to exercise with peak BP of 218/85.  Myoview was injected at peak exercise and myocardial perfusion imaging was performed  after a brief delay.  QPS Raw Data Images:  Normal; no motion artifact; normal heart/lung ratio. Stress Images:  There is normal uptake in all areas. Rest Images:  Normal homogeneous uptake in all areas of the myocardium. Subtraction (SDS):  There is no evidence of scar or ischemia. Transient Ischemic Dilatation:  1.0  (Normal <1.22)  Lung/Heart Ratio:  .3  (Normal <0.45)  Quantitative Gated Spect Images QGS EDV:  72 ml QGS ESV:  26 ml QGS EF:  64 % QGS cine images:  Normal wall motion.    Overall Impression  Exercise Capacity: Poor exercise capacity. BP Response: Normal blood pressure response. Clinical Symptoms: Fatigue, shortness of breath.  ECG Impression: No significant ST segment change suggestive of ischemia. Overall Impression: Normal stress nuclear study.
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
## 2010-07-06 ENCOUNTER — Ambulatory Visit (INDEPENDENT_AMBULATORY_CARE_PROVIDER_SITE_OTHER): Payer: 59 | Admitting: Internal Medicine

## 2010-07-06 ENCOUNTER — Encounter: Payer: Self-pay | Admitting: Internal Medicine

## 2010-07-06 DIAGNOSIS — R5381 Other malaise: Secondary | ICD-10-CM

## 2010-07-06 DIAGNOSIS — S060X9A Concussion with loss of consciousness of unspecified duration, initial encounter: Secondary | ICD-10-CM

## 2010-07-06 DIAGNOSIS — F3289 Other specified depressive episodes: Secondary | ICD-10-CM

## 2010-07-06 DIAGNOSIS — R5383 Other fatigue: Secondary | ICD-10-CM

## 2010-07-06 DIAGNOSIS — F411 Generalized anxiety disorder: Secondary | ICD-10-CM | POA: Insufficient documentation

## 2010-07-06 DIAGNOSIS — M79609 Pain in unspecified limb: Secondary | ICD-10-CM

## 2010-07-06 DIAGNOSIS — F329 Major depressive disorder, single episode, unspecified: Secondary | ICD-10-CM
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
## 2010-08-03 ENCOUNTER — Encounter: Payer: Self-pay | Admitting: Internal Medicine

## 2010-08-03 ENCOUNTER — Ambulatory Visit (INDEPENDENT_AMBULATORY_CARE_PROVIDER_SITE_OTHER): Payer: Medicaid Other | Admitting: Internal Medicine

## 2010-08-03 DIAGNOSIS — R0602 Shortness of breath: Secondary | ICD-10-CM

## 2010-08-03 DIAGNOSIS — M25519 Pain in unspecified shoulder: Secondary | ICD-10-CM

## 2010-08-03 DIAGNOSIS — S060X9A Concussion with loss of consciousness of unspecified duration, initial encounter: Secondary | ICD-10-CM

## 2010-08-03 DIAGNOSIS — I10 Essential (primary) hypertension: Secondary | ICD-10-CM

## 2010-08-07 LAB — WOUND CULTURE: Gram Stain: NONE SEEN

## 2010-08-11 NOTE — Assessment & Plan Note (Signed)
Summary: 1   MTH FU---STC   Vital Signs:  Patient profile:   77 year old female Height:      64 inches (162.56 cm) Weight:      224.38 pounds (101.99 kg) BMI:     38.65 O2 Sat:      98 % on Room air Temp:     98.3 degrees F (36.83 degrees C) oral Pulse rate:   79 / minute Resp:     16 per minute BP sitting:   138 / 80  (left arm) Cuff size:   regular  Vitals Entered By: Burnard Leigh CMA(AAMA) (August 03, 2010 1:23 PM)  O2 Flow:  Room air  Procedure Note  Injections: The patient complains of pain and tenderness. Indication: chronic pain Consent signed: yes  Procedure # 1: trigger point injection    Region: L trap 4 triggers    Location: L trap    Technique: 25 g needle    Medication: 10 mg depomedrol    Anesthesia: 1.0 ml 1% lidocaine w/o epinephrine    Comment: Risks including but not limited by incomplete procedure, bleeding, infection, recurrence were discussed with the patient. Consent form was signed. $ trigger points were injected. Tolerated well. Complicatons - none. Good pain relief following the procedure.   Cleaned and prepped with: alcohol and betadine Wound dressing: bandaid Instructions: ice  CC: 57-month F/U.sls,cma Comments Pt states Fluoxetine needs to be changed to 20mg  (1 by mouth once daily )   Primary Care Provider:  Tresa Garter MD  CC:  39-month F/U.sls and cma.  History of Present Illness: The patient presents for a follow up of neck/back pain, anxiety, vertigo and headaches. Overall is feeling better. She started swimming.  Current Medications (verified): 1)  Advair Diskus 250-50 Mcg/dose Misc (Fluticasone-Salmeterol) .Marland Kitchen.. 1 Bid 2)  Vitamin D3 1000 Unit  Tabs (Cholecalciferol) .Marland Kitchen.. 1 Qd 3)  Aspir-Low 81 Mg Tbec (Aspirin) .Marland Kitchen.. 1 Once Daily Pc 4)  Oralone 0.1 % Pste (Triamcinolone Acetonide) .... Use Qid As Needed Mouth Sores 5)  Atenolol 25 Mg Tabs (Atenolol) .Marland Kitchen.. 1 By Mouth Qhs 6)  Omeprazole 20 Mg Cpdr (Omeprazole) .... One By Mouth  Daily 7)  Loratadine 10 Mg  Tabs (Loratadine) .... Once Daily As Needed Allergies 8)  Triamcinolone Acetonide 0.5 % Crea (Triamcinolone Acetonide) .... Apply Bid To Affected Area As Needed 9)  Zolpidem Tartrate 10 Mg Tabs (Zolpidem Tartrate) .... 1/2-1 Tab At Bedtime As Needed Insomnia 10)  Proair Hfa 108 (90 Base) Mcg/act Aers (Albuterol Sulfate) .... 2 Inh Qid As Needed 11)  Hydrochlorothiazide 12.5 Mg Caps (Hydrochlorothiazide) .Marland Kitchen.. 1 By Mouth Once Daily in Am For Blood Pressure 12)  Naproxen 500 Mg Tabs (Naproxen) .Marland Kitchen.. 1 By Mouth Two Times A Day Pc For Pain/arthritis As Needed 13)  Pennsaid 1.5 % Soln (Diclofenac Sodium) .... 3-5 Gtt On Skin Three Times A Day For Pain 14)  Alprazolam 0.5 Mg Tabs (Alprazolam) .Marland Kitchen.. 1 By Mouth Two Times A Day As Needed Anxiety 15)  Fluoxetine Hcl 40 Mg Caps (Fluoxetine Hcl) .Marland Kitchen.. 1 By Mouth Qd 16)  Tramadol Hcl 50 Mg Tabs (Tramadol Hcl) .Marland Kitchen.. 1-2 Tabs By Mouth Two Times A Day As Needed Pain 17)  Meclizine Hcl 12.5 Mg Tabs (Meclizine Hcl) .Marland Kitchen.. 1-2 By Mouth Qid As Needed Dizziness 18)  Nortriptyline Hcl 10 Mg Caps (Nortriptyline Hcl) .Marland Kitchen.. 1-2 By Mouth At Bedtime As Needed  Allergies (verified): 1)  Enalapril Maleate 2)  Lipitor 3)  Simvastatin 4)  Lovastatin  Past History:  Past Medical History: Last updated: 07/06/2010 Allergic rhinitis Asthma Hyperlipidemia Hypertension Depression GERD Osteoarthritis Obesity Urinary incontinence L shoulder injury PTSD 2012  Social History: Last updated: 04/11/2009 Retired Married Former Smoker Regular exercise-yes 2010 yoga  Physical Exam  General:  pleasant and cooperative, in no distress, anxious, overweight-appearing.   Head:  NT Nose:  no lesions Mouth:  WNL  Neck:  Cervical spine is tender to palpation over paraspinal muscles and with the ROM L>>R Lungs:  clear bilaterally, no wheezes, rhonchi, or crackles Heart:  RRR, no murmurs, rubs, or gallops Abdomen:  soft, non-tender with normal BS, no  organomegaly, no masses Msk:  L trap is tender to palpation., stiff Neurologic:  alert & oriented X3, cranial nerves II-XII intact, and Romberg negative.   Skin:  growth on upper lip looks much better - small 2 mm area on R is still rough Psych:  Oriented X3, not suicidal, not homicidal, subdued, labile affect, slightly anxious, and judgment fair.  depressed affect.     Impression & Recommendations:  Problem # 1:  CONCUSSION WITH LOSS OF CONSCIOUSNESS (ICD-850.5) Assessment Improved  Problem # 2:  FATIGUE (ICD-780.79) Assessment: Improved  Problem # 3:  SHOULDER PAIN (ICD-719.41) L - painful trap Assessment: Improved  Her updated medication list for this problem includes:    Aspir-low 81 Mg Tbec (Aspirin) .Marland Kitchen... 1 once daily pc    Naproxen 500 Mg Tabs (Naproxen) .Marland Kitchen... 1 by mouth two times a day pc for pain/arthritis as needed    Tramadol Hcl 50 Mg Tabs (Tramadol hcl) .Marland Kitchen... 1-2 tabs by mouth two times a day as needed pain  Her updated medication list for this problem includes:    Aspir-low 81 Mg Tbec (Aspirin) .Marland Kitchen... 1 once daily pc    Naproxen 500 Mg Tabs (Naproxen) .Marland Kitchen... 1 by mouth two times a day pc for pain/arthritis as needed    Tramadol Hcl 50 Mg Tabs (Tramadol hcl) .Marland Kitchen... 1-2 tabs by mouth two times a day as needed pain  Problem # 4:  NECK PAIN (ICD-723.1) - will watch Assessment: Improved  Her updated medication list for this problem includes:    Aspir-low 81 Mg Tbec (Aspirin) .Marland Kitchen... 1 once daily pc    Naproxen 500 Mg Tabs (Naproxen) .Marland Kitchen... 1 by mouth two times a day pc for pain/arthritis as needed    Tramadol Hcl 50 Mg Tabs (Tramadol hcl) .Marland Kitchen... 1-2 tabs by mouth two times a day as needed pain  Her updated medication list for this problem includes:    Aspir-low 81 Mg Tbec (Aspirin) .Marland Kitchen... 1 once daily pc    Naproxen 500 Mg Tabs (Naproxen) .Marland Kitchen... 1 by mouth two times a day pc for pain/arthritis as needed    Tramadol Hcl 50 Mg Tabs (Tramadol hcl) .Marland Kitchen... 1-2 tabs by mouth two  times a day as needed pain  Problem # 5:  ASTHMA NOS W/ACUTE EXACERBATION (UJW-119.14) Assessment: New  Her updated medication list for this problem includes:    Advair Diskus 250-50 Mcg/dose Misc (Fluticasone-salmeterol) .Marland Kitchen... 1 bid    Proair Hfa 108 (90 Base) Mcg/act Aers (Albuterol sulfate) .Marland Kitchen... 2 inh qid as needed  Complete Medication List: 1)  Advair Diskus 250-50 Mcg/dose Misc (Fluticasone-salmeterol) .Marland Kitchen.. 1 bid 2)  Vitamin D3 1000 Unit Tabs (Cholecalciferol) .Marland Kitchen.. 1 qd 3)  Aspir-low 81 Mg Tbec (Aspirin) .Marland Kitchen.. 1 once daily pc 4)  Oralone 0.1 % Pste (Triamcinolone acetonide) .... Use qid as needed mouth sores 5)  Atenolol 25  Mg Tabs (Atenolol) .Marland Kitchen.. 1 by mouth qhs 6)  Omeprazole 20 Mg Cpdr (Omeprazole) .... One by mouth daily 7)  Loratadine 10 Mg Tabs (Loratadine) .... Once daily as needed allergies 8)  Triamcinolone Acetonide 0.5 % Crea (Triamcinolone acetonide) .... Apply bid to affected area as needed 9)  Zolpidem Tartrate 10 Mg Tabs (Zolpidem tartrate) .... 1/2-1 tab at bedtime as needed insomnia 10)  Proair Hfa 108 (90 Base) Mcg/act Aers (Albuterol sulfate) .... 2 inh qid as needed 11)  Hydrochlorothiazide 12.5 Mg Caps (Hydrochlorothiazide) .Marland Kitchen.. 1 by mouth once daily in am for blood pressure 12)  Naproxen 500 Mg Tabs (Naproxen) .Marland Kitchen.. 1 by mouth two times a day pc for pain/arthritis as needed 13)  Pennsaid 1.5 % Soln (Diclofenac sodium) .... 3-5 gtt on skin three times a day for pain 14)  Alprazolam 0.5 Mg Tabs (Alprazolam) .Marland Kitchen.. 1 by mouth two times a day as needed anxiety 15)  Fluoxetine Hcl 40 Mg Caps (Fluoxetine hcl) .Marland Kitchen.. 1 by mouth qd 16)  Tramadol Hcl 50 Mg Tabs (Tramadol hcl) .Marland Kitchen.. 1-2 tabs by mouth two times a day as needed pain 17)  Meclizine Hcl 12.5 Mg Tabs (Meclizine hcl) .Marland Kitchen.. 1-2 by mouth qid as needed dizziness 18)  Nortriptyline Hcl 10 Mg Caps (Nortriptyline hcl) .Marland Kitchen.. 1-2 by mouth at bedtime as needed  Other Orders: Trigger Point Injection (1 or 2 muscles)  ([address]) Depo- Medrol 40mg  (J1030)  Patient Instructions: 1)  Please schedule a follow-up appointment in 2 months.   Orders Added: 1)  Est. Patient Level IV [60454] 2)  Trigger Point Injection (1 or 2 muscles) [20552] 3)  Depo- Medrol 40mg  [J1030]

## 2010-08-11 NOTE — Miscellaneous (Signed)
Summary: Depo inject neck/[facility]  Depo inject neck/[facility]   Imported By: Sherian Rein 08/05/2010 15:49:33  _____________________________________________________________________  External Attachment:    Type:   Image     Comment:   External Document

## 2010-09-07 LAB — POCT I-STAT 4, (NA,K, GLUC, HGB,HCT)
Glucose, Bld: 110 mg/dL — ABNORMAL HIGH (ref 70–99)
HCT: 48 % — ABNORMAL HIGH (ref 36.0–46.0)
Hemoglobin: 16.3 g/dL — ABNORMAL HIGH (ref 12.0–15.0)
Potassium: 4.6 mEq/L (ref 3.5–5.1)
Sodium: 139 mEq/L (ref 135–145)

## 2010-09-07 LAB — GLUCOSE, CAPILLARY: Glucose-Capillary: 97 mg/dL (ref 70–99)

## 2010-10-05 ENCOUNTER — Encounter: Payer: Self-pay | Admitting: Internal Medicine

## 2010-10-06 NOTE — Op Note (Signed)
NAME:  Sabrina Page, Sabrina Page      ACCOUNT NO.:  [id number]   MEDICAL RECORD NO.:  [id number]          PATIENT TYPE:  AMB   LOCATION:  [facility]                         FACILITY:  [facility]   PHYSICIAN:  Ollen Gross, M.D.    DATE OF BIRTH:  21-Mar-1934   DATE OF PROCEDURE:  06/12/2008  DATE OF DISCHARGE:                               OPERATIVE REPORT   PREOPERATIVE DIAGNOSIS:  Right knee medial and lateral meniscal tears.   POSTOPERATIVE DIAGNOSIS:  Right knee medial and lateral meniscal tears,  chondral defect medial.   PROCEDURE:  Right knee arthroscopy with medial and lateral meniscal  debridement and chondroplasty.   SURGEON:  Ollen Gross, M.D.   ASSISTANT:  None.   ANESTHESIA:  General.   BLOOD LOSS:  Minimal.   DRAINS:  None.   COMPLICATIONS:  None.   CONDITION:  Stable to recovery.   BRIEF CLINICAL NOTE:  [name] is a 77 year old female several month  history of right knee pain mechanical symptoms.  Exam and history  suggested meniscal tear confirmed by MRI.  She presents now for  arthroscopy and debridement.   PROCEDURE IN DETAIL:  After successful administration of general  anesthetic, a tourniquet was placed high on her right thigh and right  lower extremity prepped and draped in usual sterile fashion.  Standard  superomedial and inferolateral incisions were made, in flow cannula  passed superomedial, camera passed inferolateral.  Arthroscopic  visualization proceeds.  The undersurface of the patella has some grade  2 and 3 defects, but no full-thickness defects.  Trochlea had some grade  II but again no full-thickness defects.  Medial and lateral gutters were  visualized.  There were no loose bodies.  Flexion valgus force applied  to the knee and the medial compartment was entered.  There is evidence  of a large chondral defect on the medial femoral condyle.  The cartilage  was unstable on the condyle.  There is also evidence of a medial  meniscal tear.  A  spinal needle is used to localize the inferomedial  portal.  A small incision made, dilator placed and then the meniscus  debrided back to stable base with baskets and a 4.2 shaver and then  sealed off with the ArthroCare device.  The chondral defect was probed  and found to be unstable.  It is about 1 x 2 cm.  I debrided back to  stable bony base with stable cartilaginous edges.  It is probed and  found to be stable.  Intercondylar notch was visualized.  The ACL was  intact.  Lateral compartment is entered and there is evidence of a tear  in the body and posterior horn of the lateral meniscus.  This is  debrided back to a stable base with the baskets and shavers and sealed  off with the ArthroCare.   The joint was again inspected.  There were no further tears, loose  bodies or chondral defects.  The arthroscopic equipment was then removed  from inferior portals which were closed with interrupted 4-0 nylon.  20  mL of 0.25% Marcaine with epi were injected through the inflow cannula  then that is removed  and that portal closed with nylon.  Bulky sterile  dressing is applied and she is awakened and transferred to recovery in  stable condition.      Ollen Gross, M.D.  Electronically Signed     FA/MEDQ  D:  06/12/2008  T:  06/13/2008  Job:  [id number]

## 2010-10-06 NOTE — Op Note (Signed)
NAME:  Sabrina Page, Sabrina Page      ACCOUNT NO.:  [id number]   MEDICAL RECORD NO.:  [id number]          PATIENT TYPE:  AMB   LOCATION:  ENDO                         FACILITY:  [facility]   PHYSICIAN:  Anselmo Rod, M.D.  DATE OF BIRTH:  25-Jan-1934   DATE OF PROCEDURE:  11/28/2006  DATE OF DISCHARGE:                               OPERATIVE REPORT   PROCEDURE PERFORMED:  EGD with multiple biopsy.   ENDOSCOPIST:  Anselmo Rod, M.D.   INSTRUMENT USED:  Pentax video panendoscope.   INDICATIONS FOR PROCEDURE:  77 year old [place] female with history of  epigastric pain and dysphagia undergoing an EGD to rule out esophagitis,  peptic ulcer disease, etc.   PREPROCEDURE PREPARATION:  Informed consent was procured from the  patient.  The patient was fasted for eight hours prior to the procedure  and was advised to avoid nonsteroidals for five days prior to the  procedure.  Risks and benefits of the procedure were discussed with her  in detail.   PREPROCEDURE PHYSICAL:  The patient had stable vital signs.  Neck  supple.  Chest clear to auscultation.  S1, S2 regular.  Abdomen soft  with normal bowel sounds.   DESCRIPTION OF PROCEDURE:  The patient was placed in the left lateral  decubitus position and sedated with 75 mcg of Fentanyl and 7.5 mg of  Versed given intravenously in slow incremental doses. Once the patient  was adequately sedated and maintained on low-flow oxygen and continuous  cardiac monitoring, the Pentax video panendoscope was advanced through  the mouthpiece over the tongue into the esophagus under direct vision.  The entire esophagus was widely patent with no evidence of ring,  stricture, masses, esophagitis or Barrett's mucosa. The scope was then  advanced into the stomach. A small hiatal hernia was seen on high  retroflexion.  Patchy moderate gastritis was noted.  Biopsies were done  to rule out presence of H pylori by pathology.  The proximal small bowel  appeared  normal.  There was no outlet obstruction.  The patient  tolerated the procedure well without immediate complications.   IMPRESSION:  1. Widely patent esophagus with no evidence of ring, stricture, mass,      esophagitis or Barrett's mucosa, healthy Z line.  2. Small hiatal hernia.  3. Patchy gastritis in the stomach.  Biopsies done to rule out H      pylori.  4. No ulcers, masses or polyps seen.  5. Normal proximal small bowel.  No evidence of outlet obstruction.   RECOMMENDATIONS:  1. Await pathology results.  2. Avoid nonsteroidals including aspirin, especially over the next 2      weeks.  3. Treat with antibiotics if H pylori present on biopsies.  4. PPI of choice for the next 3-6 months.  5. Proceed with a colonoscopy at this time.  Further recommendations      made thereafter.      Anselmo Rod, M.D.  Electronically Signed     JNM/MEDQ  D:  11/28/2006  T:  11/28/2006  Job:  [id number]   cc:   Georgina Quint. Plotnikov, MD  [address] [facility]  [place]  [place] [address]

## 2010-10-06 NOTE — Op Note (Signed)
NAMESUNDI, SLEVIN      ACCOUNT NO.:  [id number]   MEDICAL RECORD NO.:  [id number]          PATIENT TYPE:  AMB   LOCATION:  ENDO                         FACILITY:  [facility]   PHYSICIAN:  Anselmo Rod, M.D.  DATE OF BIRTH:  [birth year]   DATE OF PROCEDURE:  11/28/2006  DATE OF DISCHARGE:                               OPERATIVE REPORT   PROCEDURE PERFORMED:  Screening colonoscopy.   ENDOSCOPIST:  Anselmo Rod, M.D.   INSTRUMENT USED:  Pentax video colonoscope.   INDICATIONS FOR PROCEDURE:  A 77 year old [place] female a undergoing a  screening colonoscopy to rule out colonic polyps, masses, etc.   PREPROCEDURE PREPARATION:  Informed consent was procured from the  patient. The patient was fasted for 8 hours prior to the procedure and  prepped with a bottle of magnesium citrate and a gallon of NuLYTELY the  night prior to the procedure.  Risks and benefits of the procedure  including a 10% miss rate of cancer and polyp were discussed with the  patient as well.   PREPROCEDURE PHYSICAL:  The patient had stable vital signs.  NECK:  Supple.  CHEST clear to auscultation.  S1, S2, regular.  ABDOMEN:  Soft, obese with normal bowel sounds.   DESCRIPTION OF PROCEDURE:  The patient was placed in the left lateral  decubitus position and sedated with an additional 25 mcg of Fentanyl and  2.5 mg of Versed in slow incremental doses.  Once the patient was  adequately sedate and maintained on low-flow oxygen and continuous  cardiac monitoring, the Pentax video colonoscope was advanced from the  rectum to the cecum.  The patient had an excellent prep.  A few early  sigmoid and rectosigmoid diverticula were seen.  The rest of the colonic  mucosa up to the terminal ileum appeared normal.  The appendiceal  orifice and ileocecal valve were clearly visualized and photographed.  Retroflexion in the rectum revealed small internal hemorrhoids.  The  patient tolerated the procedure well  without immediate complications.   IMPRESSION:  1. A few early sigmoid and rectosigmoid diverticula.  2. Small internal hemorrhoids.  3. Otherwise normal colon up to the terminal ileum.  No masses,      polyps, erosions, or ulcerations seen.   RECOMMENDATIONS:  1. A high-fiber diet has been advised and brochures have been given to      the patient for education and brochures on diverticulosis have been      handed to the patient as well.  2. A repeat colonoscopy has been advised in the next 10 years.  If the      patient has any abnormal symptoms in the interim from a GI      standpoint, she is to contact the office immediately.  Further      recommendations will be made in follow-up as need arises in the      future.      Anselmo Rod, M.D.  Electronically Signed     JNM/MEDQ  D:  11/28/2006  T:  11/28/2006  Job:  [id number]   cc:   Georgina Quint. Plotnikov, MD  [address] [facility]  [place]  [place] [address]

## 2010-10-07 ENCOUNTER — Encounter: Payer: Self-pay | Admitting: Internal Medicine

## 2010-10-07 ENCOUNTER — Other Ambulatory Visit (INDEPENDENT_AMBULATORY_CARE_PROVIDER_SITE_OTHER): Payer: 59

## 2010-10-07 ENCOUNTER — Ambulatory Visit (INDEPENDENT_AMBULATORY_CARE_PROVIDER_SITE_OTHER): Payer: 59 | Admitting: Internal Medicine

## 2010-10-07 DIAGNOSIS — I872 Venous insufficiency (chronic) (peripheral): Secondary | ICD-10-CM

## 2010-10-07 DIAGNOSIS — R609 Edema, unspecified: Secondary | ICD-10-CM

## 2010-10-07 DIAGNOSIS — R202 Paresthesia of skin: Secondary | ICD-10-CM

## 2010-10-07 DIAGNOSIS — F3289 Other specified depressive episodes: Secondary | ICD-10-CM

## 2010-10-07 DIAGNOSIS — F329 Major depressive disorder, single episode, unspecified: Secondary | ICD-10-CM

## 2010-10-07 DIAGNOSIS — J45909 Unspecified asthma, uncomplicated: Secondary | ICD-10-CM

## 2010-10-07 DIAGNOSIS — R7309 Other abnormal glucose: Secondary | ICD-10-CM

## 2010-10-07 DIAGNOSIS — I10 Essential (primary) hypertension: Secondary | ICD-10-CM

## 2010-10-07 DIAGNOSIS — M1990 Unspecified osteoarthritis, unspecified site: Secondary | ICD-10-CM

## 2010-10-07 DIAGNOSIS — R209 Unspecified disturbances of skin sensation: Secondary | ICD-10-CM

## 2010-10-07 LAB — COMPREHENSIVE METABOLIC PANEL
ALT: 16 U/L (ref 0–35)
AST: 17 U/L (ref 0–37)
Albumin: 3.5 g/dL (ref 3.5–5.2)
Alkaline Phosphatase: 57 U/L (ref 39–117)
BUN: 17 mg/dL (ref 6–23)
CO2: 33 mEq/L — ABNORMAL HIGH (ref 19–32)
Calcium: 9 mg/dL (ref 8.4–10.5)
Chloride: 101 mEq/L (ref 96–112)
Creatinine, Ser: 0.8 mg/dL (ref 0.4–1.2)
GFR: 73.26 mL/min (ref >60.00)
Glucose, Bld: 103 mg/dL — ABNORMAL HIGH (ref 70–99)
Potassium: 4.3 mEq/L (ref 3.5–5.1)
Sodium: 141 mEq/L (ref 135–145)
Total Bilirubin: 0.8 mg/dL (ref 0.3–1.2)
Total Protein: 6.3 g/dL (ref 6.0–8.3)

## 2010-10-07 LAB — TSH: TSH: 1.44 u[IU]/mL (ref 0.35–5.50)

## 2010-10-07 LAB — CBC WITH DIFFERENTIAL/PLATELET
Basophils Absolute: 0 10*3/uL (ref 0.0–0.1)
Basophils Relative: 0.6 % (ref 0.0–3.0)
Eosinophils Absolute: 0.2 10*3/uL (ref 0.0–0.7)
Eosinophils Relative: 3.4 % (ref 0.0–5.0)
HCT: 43.8 % (ref 36.0–46.0)
Hemoglobin: 14.7 g/dL (ref 12.0–15.0)
Lymphocytes Relative: 36.8 % (ref 12.0–46.0)
Lymphs Abs: 1.7 10*3/uL (ref 0.7–4.0)
MCHC: 33.4 g/dL (ref 30.0–36.0)
MCV: 88 fl (ref 78.0–100.0)
Monocytes Absolute: 0.4 10*3/uL (ref 0.1–1.0)
Monocytes Relative: 9.1 % (ref 3.0–12.0)
Neutro Abs: 2.3 10*3/uL (ref 1.4–7.7)
Neutrophils Relative %: 50.1 % (ref 43.0–77.0)
Platelets: 171 10*3/uL (ref 150.0–400.0)
RBC: 4.98 Mil/uL (ref 3.87–5.11)
RDW: 14.5 % (ref 11.5–14.6)
WBC: 4.6 10*3/uL (ref 4.5–10.5)

## 2010-10-07 LAB — VITAMIN B12: Vitamin B-12: 509 pg/mL (ref 211–911)

## 2010-10-07 LAB — HEMOGLOBIN A1C: Hgb A1c MFr Bld: 6 % (ref 4.6–6.5)

## 2010-10-07 NOTE — Progress Notes (Signed)
  Subjective:    Patient ID: Sabrina Page, female    DOB: [birth year], 77 y.o.   MRN: [id number]  HPI  The patient presents for a follow-up of  chronic hypertension, chronic dyslipidemia, type 2 diabetes controlled with medicines and diet. She stopped taking some meds. She stopped swimming for ? Cause. Risks associated with treatment noncompliance were discussed. Compliance was encouraged.     Review of Systems     Objective:   Physical Exam        Assessment & Plan:

## 2010-10-07 NOTE — Assessment & Plan Note (Signed)
On Rx 

## 2010-10-07 NOTE — Assessment & Plan Note (Signed)
Monitor labs 

## 2010-10-07 NOTE — Assessment & Plan Note (Signed)
Edema is better 

## 2010-10-07 NOTE — Progress Notes (Signed)
  Subjective:    Patient ID: Sabrina Page, female    DOB: Aug 10, 1933, 77 y.o.   MRN: [id number]  HPI    Review of Systems  Constitutional: Negative for activity change and fatigue.  HENT: Negative for ear pain.   Eyes: Negative for photophobia.  Respiratory: Negative for stridor.   Neurological: Positive for light-headedness. Negative for facial asymmetry, weakness and numbness.  Psychiatric/Behavioral: Negative for confusion and agitation.       Objective:   Physical Exam  Constitutional: She appears well-developed and well-nourished. No distress.       Obese  HENT:  Head: Normocephalic.  Right Ear: External ear normal.  Left Ear: External ear normal.  Nose: Nose normal.  Mouth/Throat: Oropharynx is clear and moist.  Eyes: Conjunctivae are normal. Pupils are equal, round, and reactive to light. Right eye exhibits no discharge. Left eye exhibits no discharge.  Neck: Normal range of motion. Neck supple. No JVD present. No tracheal deviation present. No thyromegaly present.  Cardiovascular: Normal rate, regular rhythm and normal heart sounds.   Pulmonary/Chest: No stridor. No respiratory distress. She has no wheezes.  Abdominal: Soft. Bowel sounds are normal. She exhibits no distension and no mass. There is no tenderness. There is no rebound and no guarding.  Musculoskeletal: She exhibits no edema and no tenderness.  Lymphadenopathy:    She has no cervical adenopathy.  Neurological: She displays normal reflexes. No cranial nerve deficit. She exhibits normal muscle tone. Coordination normal.  Skin: No rash noted. No erythema.  Psychiatric: She has a normal mood and affect. Her behavior is normal. Judgment and thought content normal.       Wt Readings from Last 3 Encounters:  10/07/10 225 lb (102.059 kg)  08/03/10 224 lb 6.1 oz (101.779 kg)  07/06/10 224 lb 12 oz (101.946 kg)      Assessment & Plan:  OSTEOARTHRITIS On Rx  HYPERTENSION On Rx  EDEMA Edema is  better  ASTHMA On Rx  ABNORMAL GLUCOSE NEC Monitor labs  DEPRESSION On Rx  VENOUS INSUFFICIENCY On Rx

## 2010-10-08 ENCOUNTER — Telehealth: Payer: Self-pay | Admitting: Internal Medicine

## 2010-10-08 NOTE — Telephone Encounter (Signed)
Left mess for patient to call back.  

## 2010-10-08 NOTE — Telephone Encounter (Signed)
Sabrina Page, please, inform patient and her husband that all their  labs are OK Thx

## 2010-10-09 NOTE — Assessment & Plan Note (Signed)
[facility]                             PRIMARY CARE OFFICE NOTE   DORLA, GUIZAR               MRN:          [id number]  DATE:01/19/2006                            DOB:          1933/07/31    Internal medicine consultation requested by Dr. Lequita Halt.   REASON:  Preop clearance for left knee surgery on February 02, 2006.   HISTORY:  The patient is a 77 year old female with a number of medical  problems, who presents for surgical clearance.  She has no active complaints  except for weight gain that she refers to lack of exercise.  She has been  feeling tired lately as well.  There is no chest pain, shortness of breath,  syncope or blood in the stool, etc.   PAST MEDICAL HISTORY:  1. Hypertension.  2. Dyslipidemia.  3. Osteoarthritis.  4. History of left leg fracture.  5. Questionable asthma.   ALLERGIES:  None.   CURRENT MEDICATIONS:  1. Enalapril/HCT 10/25 mg one daily.  2. Aspirin 81 mg daily.  3. Atenolol 50 mg daily.  4. Simvastatin 40 mg daily.  5. Omeprazole 20 mg daily.   SOCIAL HISTORY:  She is from [place].  She is married.  Her daughter works  at [employer].  She has been living in the [place] for 12  years.  Does not smoke or drink alcohol.   FAMILY HISTORY:  Negative for cancer or heart attack.   REVIEW OF SYSTEMS:  She stopped smoking awhile ago.  She used to be an  Programmer, multimedia.  Had normal coronary angiogram in 2006.  She also has problems with  arthritis, fatigue, weight gain.  Complains of anxiety attacks and  irritability at times.  The rest is negative.   PHYSICAL EXAMINATION:  VITAL SIGNS:  Blood pressure 94/63, pulse 77,  temperature 99.6, weight 230 pounds.  GENERAL:  She is in no acute distress, looks well.  HEENT:  Moist mucosa.  NECK:  Supple, no thyromegaly or bruit.  LUNGS:  Clear, no wheeze or rales.  CARDIAC:  S1, S2, distant tones, no gallop.  ABDOMEN:  Obese, soft, nontender,  no organomegaly, no mass felt.  EXTREMITIES:  Lower extremities without edema.  Calves nontender.  Left knee  tender with range of motion.  Crackling appreciated.  SKIN:  Clear.  NEUROLOGIC:  She is alert, oriented and cooperative, anxious and slightly  depressed.   IMPRESSION/RECOMMENDATIONS:  1. Left knee pain/osteoarthritis.  Surgery is scheduled for February 02, 2006.  She should be medically clear for surgery assuming that      anesthesia workup with blood count, chemistry, EKG and chest x-ray is      normal.  She had a chest x-ray here on November 12, 2005, which showed      questionable vague density in the right lung base.  As I said, it      should be helpful to obtain a chest x-ray preop.  As far as her      cardiovascular status, she states she had a stress test and a normal  angiogram [place].  2. Weight gain.  She will focus more on diet with decreased calorie      intake.  3. Elevated glucose.  Obtain A1c level later.  Advised to lose weight.  4. Anxiety/depressed mood.  Prozac 10 mg daily, Ativan 0.5 mg b.i.d.      p.r.n.  5. Fatigue.  Will decrease atenolol to 25 mg one-half daily and      enalapril/HCT 10/25 mg to one-half daily.  May need to discontinue      atenolol altogether if continues to feel tired.   We will be happy to follow with you.                                   Sonda Primes, MD   AP/MedQ  DD:  01/20/2006  DT:  01/20/2006  Job #:  [id number]   cc:   Ollen Gross, MD

## 2010-10-09 NOTE — Discharge Summary (Signed)
Sabrina Page, Sabrina Page      ACCOUNT NO.:  [id number]   MEDICAL RECORD NO.:  [id number]          PATIENT TYPE:  INP   LOCATION:  1516                         FACILITY:  [facility]   PHYSICIAN:  Ollen Gross, M.D.    DATE OF BIRTH:  January 14, 1934   DATE OF ADMISSION:  02/02/2006  DATE OF DISCHARGE:  02/06/2006                                 DISCHARGE SUMMARY   ADMITTING DIAGNOSES:  1. Bilateral knees osteoarthritis, left more symptomatic than the right.  2. Hypertension.  3. Hyperlipidemia.  4. Anxiety.  5. Remote history of asthma.  6. Vertigo.  7. Mild stress urinary incontinence.  8. Seasonal allergies.  9. Multiple ear infections during childhood years.   DISCHARGE DIAGNOSES:  1. Osteoarthritis, left knee, with severe valgus deformity, status post      left total knee arthroplasty.  2. Osteoarthritis, right knee.  3. Acute blood loss anemia.  Did not require transfusion.  4. Postoperative hyponatremia, improved.  5. Hypertension.  6. Hyperlipidemia.  7. Anxiety.  8. Remote history of asthma.  9. Vertigo.  10.Mild stress urinary incontinence.  11.Seasonal allergies.  12.Multiple ear infections during childhood years.   PROCEDURE:  February 02, 2006 - left total knee surgery, Dr. Lequita Halt.  Assistant - Avel Peace, P.A.-C.  Spinal anesthesia.  Tourniquet time 46  minutes.   CONSULTS:  None.   BRIEF HISTORY:  The patient is a 77 year old female with severe end-stage  arthritis of the left knee with a proximal 20 degree valgus deformity, end-  stage arthritis, who now presents for a total knee arthroplasty.   LABORATORY DATA:  CBC on admission revealed a hemoglobin of 15.0, hematocrit  43.7, white cell count 5.0, postoperative hemoglobin 11.5 which drifted down  to 10.3.  The last hemoglobin and hematocrit were 9.9 and 29.1.  PT and PTT  on admission were 13.4 and 30 respectively.  INR of 1.0.  Serum pro time  follows.  PT INR of 21.4 and 1.8.  Chem panel on  admission all within normal  limits.  Serial BMETs were followed.  Sodium did drop from 137 to 134 back  up to 137.  CO2 went up a little bit from 30 to 33.  Calcium dropped from  9.3 to 8.3.  Preoperative UA revealed trace leukocyte esterase, 0-2 white  cells, rare bacteria; otherwise negative.  Blood group type B positive.   EKG on January 31, 2006 revealed normal sinus rhythm, normal EKG,  confirmed by Dr. Lenise Herald.  Two view chest on January 31, 2006  revealed no acute disease.   HOSPITAL COURSE:  The patient was admitted to [facility] and  tolerated the procedure well.  Later returned to the recovery room and the  orthopedic floor, started on PCA and p.o. analgesics for pain control  following surgery.  Given 24 hours postoperative IV antibiotics.  Started on  Coumadin for DVT prophylaxis.  The patient was in a fair amount of pain on  the evening of surgery, and also into the morning of day #1.  Had low blood  pressures, which were felt to be due to the narcotic on the evening of  surgery.  She was treated with fluids.  This was a little bit better.  The  fluids helped out with her pressure.  She did have a little bit of low  sodium of day #1, so the fluids were switched over to normal saline.  She  started getting up out of bed.  Her daughter, Thornton Park, is a therapist here  with [facility], and was helping her quite a bit.  She was weaning  over to p.o. medications.  By day #2, she was feeling better, pain was under  a little bit better control.  She actually got up in the hallway and walked  about 5 or 10 feet with some assistance.  Hemoglobin was 10.3.  Sodium had  come back up.  The dressing was changed.  The incision looked good.  PCA was  discontinued.  Unfortunately, later than afternoon, she had some dizziness  and some orthostatic pressures.  She was again treated with fluids, and some  of the dizziness after talking with the nurse at length, was  noted to be  preoperative.  She did have a history of vertigo, and sometimes when  changing positions she did get this.  This was monitored very closely.  Her  pressures did improve after boluses of saline.  By the following day she was  doing much better.  She had not had any dizziness.  On day #3, she got up  and ambulated 160 feet.  She was doing very well with her therapy.  She had  a low-grade temperature, for which we encouraged her to use incentive  spirometer.  She progressed well on day #3, 160 feet.  She continued to do  well.  Arrangements were made, and she was discharged home the following day  on February 06, 2006.   DISCHARGE PLAN:  1. The patient was discharged home on February 06, 2006.  2. Discharge diagnoses - please see above.  3. Discharge medications - Coumadin, Percocet, Robaxin.  4. Diet - resume previous home diet.  5. Activity - weightbearing as tolerated, left lower extremity.  Home with      PT and home nursing for gait training, ambulation and ADL's.  6. Followup in 2 weeks.   DISPOSITION:  Home.   CONDITION UPON DISCHARGE:  Improved.      Alexzandrew L. Julien Girt, P.A.      Ollen Gross, M.D.  Electronically Signed    ALP/MEDQ  D:  02/11/2006  T:  02/14/2006  Job:  [id number]   cc:   Georgina Quint. Plotnikov, MD  [address]  [place]  [place] [address]

## 2010-10-09 NOTE — H&P (Signed)
NAME:  Sabrina Page, AUER      ACCOUNT NO.:  [id number]   MEDICAL RECORD NO.:  [id number]          PATIENT TYPE:  INP   LOCATION:  NA                           FACILITY:  [facility]   PHYSICIAN:  Ollen Gross, M.D.    DATE OF BIRTH:  1934-01-21   DATE OF ADMISSION:  02/02/2006  DATE OF DISCHARGE:                                HISTORY & PHYSICAL   DATE OF OFFICE VISIT/HISTORY AND PHYSICAL:  January 20, 2006.   DATE OF ADMISSION:  February 02, 2006.   CHIEF COMPLAINT:  Bilateral knee pain, left greater than right.   HISTORY OF PRESENT ILLNESS:  Patient is a 77 year old female who is seen for  evaluation for bilateral knee pain.  She is the mother of [name], who is a  physical therapist here at [facility].  She has a multiple year  history of bilateral knee pain.  The left is more symptomatic, problematic  over the past several years, than the right.  She has had a fracture there  about 40 years ago, eventually healed.  She has unfortunately developed end-  stage arthritis in both knees.  Again, the left is more symptomatic.  She  has reached a point where she would like to have something done about it.  Risks and benefits of the surgery have been discussed with the patient, and  she elects to proceed with surgery.   ALLERGIES:  No known drug allergies.   CURRENT MEDICATIONS:  Zocor, enalapril, atenolol, fexofenadine, and Ativan.   PAST MEDICAL HISTORY:  1. Hypertension.  2. Hyperlipidemia.  3. Anxiety.  4. Remote history of asthma years ago.  5. History of chronic sinus infections.  6. Vertigo.  7. Multiple ear infections as a child.  8. Seasonal allergies.  9. Mild stress incontinence.   PAST SURGICAL HISTORY:  Left knee open meniscectomy about 40 years ago.  She  had an I&D of her right breast for mastitis.   FAMILY HISTORY:  Noncontributory.   SOCIAL HISTORY:  Married.  Two children.  Previous smoking history but quit  about seven years ago.  Rare social  intake of alcohol.   REVIEW OF SYSTEMS:  GENERAL:  No fevers, chills, night sweats.  NEURO:  No  seizures, syncope, paralysis.  Does have a history of vertigo.  RESPIRATORY:  No shortness of breath, productive cough, or hemoptysis.  Does have a remote  history of asthma.  CARDIOVASCULAR:  No chest pain, angina, or orthopnea.  GI:  No nausea, vomiting, diarrhea, or constipation.  GU:  No dysuria,  hematuria, or discharge.  She does have a little bit of mild stress urinary  incontinence.  MUSCULOSKELETAL:  Knee pain bilaterally.   PHYSICAL EXAMINATION:  VITAL SIGNS:  Pulse 76, respirations 12, blood  pressure 112/70.  GENERAL:  A 77 year old white female who is well-developed and well-  nourished, large frame, slightly overweight, in no acute distress.  She is  accompanied by her daughter.  She does speak some English, but her daughter  does help to translate and help with communication.  HEENT:  Normocephalic and atraumatic.  Pupils are round and reactive.  Oropharynx is clear.  EOMs are intact.  NECK:  Supple.  No bruits are appreciated.  CHEST:  Clear anterior and posterior chest wall.  No rales, rhonchi or  wheezes are appreciated.  HEART:  Regular rate and rhythm without murmur.  S1 and S2 noted.  No rubs,  thrills, palpitations.  ABDOMEN:  Soft, slightly round, slightly protuberant abdomen.  Bowel sounds  are present.  RECTAL/BREASTS/GENITALIA:  Not done.  Not pertinent to the present illness.  EXTREMITIES:  Bilateral knees:  Left knee shows range of motion of 5-110  with a varus malalignment, marked crepitus, no instability.  Right knee  shows range of motion of 5-120, marked crepitus.  No instability.   IMPRESSION:  1. Bilateral knee osteoarthritis, left more symptomatic than right.  2. Hypertension.  3. Hyperlipidemia.  4. Anxiety.  5. Remote history of asthma.  6. Vertigo.  7. Mild stress urinary incontinence.  8. Seasonal allergies.  9. Multiple ear infections during  childhood years.   PLAN:  Patient admitted to [facility] to undergo a left total knee  arthroplasty.  Surgery will be performed by Dr. Trudee Grip.      Alexzandrew L. Julien Girt, P.A.      Ollen Gross, M.D.  Electronically Signed    ALP/MEDQ  D:  02/01/2006  T:  02/01/2006  Job:  [id number]   cc:   Georgina Quint. Plotnikov, MD  [address]  [place]  [place] [address]

## 2010-10-09 NOTE — Op Note (Signed)
NAME:  Sabrina, Page NO.:  [id number]   MEDICAL RECORD NO.:  [id number]          PATIENT TYPE:  INP   LOCATION:  0007                         FACILITY:  [facility]   PHYSICIAN:  Ollen Gross, M.D.    DATE OF BIRTH:  1933/08/11   DATE OF PROCEDURE:  02/02/2006  DATE OF DISCHARGE:                                 OPERATIVE REPORT   PREOPERATIVE DIAGNOSIS:  Osteoarthritis, left knee, with severe valgus  deformity.   POSTOPERATIVE DIAGNOSIS:  Osteoarthritis, left knee, with severe valgus  deformity.   PROCEDURE:  Left total knee arthroplasty.   SURGEON:  Ollen Gross, M.D.   ASSISTANT:  Alexzandrew L. Julien Girt, P.A.   ANESTHESIA:  Spinal.   ESTIMATED BLOOD LOSS:  Minimal.   DRAINS:  Hemovac x1.   TOURNIQUET TIME:  46 minutes at 300 mmHg.   COMPLICATIONS:  None.   CONDITION:  Stable to recovery.   CLINICAL NOTE:  Sabrina Page is a 77 year old female who has severe end-  stage osteoarthritis of the left knee with approximately 20 degrees valgus  deformity.  She has end-stage arthritic change and presents now for total  knee arthroplasty.   PROCEDURE IN DETAIL:  After successful initiation of spinal anesthetic, a  tourniquet was placed on the left thigh and left lower extremity prepped and  draped in the usual sterile fashion.  Extremity was wrapped in Esmarch, knee  flexed, tourniquet inflated to 300 mmHg.  Midline incision was made with 10  blade through subcutaneous tissue to the level of the extensor mechanism.  Fresh blade was used to make a lateral parapatellar arthrotomy given her  significant valgus deformity.  Soft tissue over the proximal and lateral  tibia was then subperiosteally elevated around the joint line with a knife.  We then able to evert the patella medially.  The knee was then flexed 90  degrees and ACL and PCL removed.  She had severe degeneration of the lateral  compartment as well as bone on bone medial.  A drill was then used  to create  a starting hole in the distal femur, and canal was thoroughly irrigated.  The 5-degree left valgus alignment guide was placed, and referencing off the  posterior condyles rotations marked, and the block pins were removed 10 mm  off of the distal femur.  Distal femoral resection was made with an  oscillating saw.  Sizing blocks placed and size 4 was most appropriate.  The  rotation was marked off the epicondylar axis.  Size 4 cutting blocks placed  and the anterior, posterior and chamfer cuts were made.   The tibia subluxed forward, the menisci removed.  The extramedullary tibial  alignment guide was placed referencing proximally at the medial aspect of  the tibial tubercle and distally along the second metatarsal axis and tibial  crest.  A block was pinned to remove approximately 2 mm off the deficient  lateral side.  Tibial resection was made with an oscillating saw.  Size 4  was  also the most appropriate tibial component, and the proximal tibia was  prepared with the modular drill and keel punch for a size 4.  Femoral  preparation was completed with the intercondylar cut.   Size 4 mobile bearing tibial trial with a size 4 posterior stabilized  femoral trial and a 10-mm posterior stabilized rotating platform insert  trial were placed.  With a 10, full extension was achieved with excellent  varus and valgus balance throughout full range of motion.  The patella was  then everted again; thickness measured to 25 mm.  Freehand resection was  taken to 15 mm, 38 template was placed, lug holes were drilled, trial  patella placed and it tracks normally.  Osteophytes were then removed off  the posterior femur with the trial in place.  All trials were removed and  the cut bone surfaces prepared with pulsatile lavage.  Cement was mixed and  once ready for implantation, a size 4 mobile bearing tibial tray, size 4  posterior stabilized femur and 38 patella were cemented in place and  patella  was held with a clamp.  Trial 10-mm inserts were placed, knee held in full  extension and all extruded cement removed.  Once cement was fully hardened,  the permanent 10-mm posterior stabilized rotating platform insert was placed  into the tibial tray.  Would was copiously irrigated with saline solution.  There was a little bit of fraying of the inferolateral border of the  patellar tendon, and I repaired that with a #1 Ethibond.  At least 75% to  90% of the tendon was fine.  This was just a small border that was frayed  and repaired.  I then released the tourniquet for a total time of 46  minutes.  Minor bleeding stopped with cautery.  The arthrotomy was then  closed over a Hemovac drain with interrupted #1 PDS.  Left open area from  the superior to the inferior pole of the patella to serve as a mini release.  Flexion against gravity was 125 degrees, and the patella tracked normally.  The subcu tissues were then closed with interrupted 2-0 Vicryl, subcuticular  running 4-0 Monocryl.  The drain was hooked to suction, incision cleaned and  dried, and Steri-Strips and a bulky sterile dressing applied.  Was then  placed into a knee immobilizer, awakened and transferred to recovery in  stable condition.      Ollen Gross, M.D.  Electronically Signed     FA/MEDQ  D:  02/02/2006  T:  02/03/2006  Job:  [id number]

## 2010-10-13 ENCOUNTER — Other Ambulatory Visit: Payer: Self-pay | Admitting: Internal Medicine

## 2010-10-16 NOTE — Telephone Encounter (Signed)
Pt informed

## 2011-01-11 ENCOUNTER — Encounter: Payer: Self-pay | Admitting: Internal Medicine

## 2011-01-11 ENCOUNTER — Ambulatory Visit (INDEPENDENT_AMBULATORY_CARE_PROVIDER_SITE_OTHER): Payer: 59 | Admitting: Internal Medicine

## 2011-01-11 DIAGNOSIS — F329 Major depressive disorder, single episode, unspecified: Secondary | ICD-10-CM

## 2011-01-11 DIAGNOSIS — J45909 Unspecified asthma, uncomplicated: Secondary | ICD-10-CM

## 2011-01-11 DIAGNOSIS — R5381 Other malaise: Secondary | ICD-10-CM

## 2011-01-11 DIAGNOSIS — I10 Essential (primary) hypertension: Secondary | ICD-10-CM

## 2011-01-11 DIAGNOSIS — R7309 Other abnormal glucose: Secondary | ICD-10-CM

## 2011-01-11 DIAGNOSIS — E785 Hyperlipidemia, unspecified: Secondary | ICD-10-CM

## 2011-01-11 DIAGNOSIS — R5383 Other fatigue: Secondary | ICD-10-CM

## 2011-01-11 DIAGNOSIS — D485 Neoplasm of uncertain behavior of skin: Secondary | ICD-10-CM

## 2011-01-11 DIAGNOSIS — F3289 Other specified depressive episodes: Secondary | ICD-10-CM

## 2011-01-11 MED ORDER — FLUOXETINE HCL (PMDD) 20 MG PO TABS: 20.0000 mg | ORAL_TABLET | ORAL | Status: DC

## 2011-01-11 NOTE — Assessment & Plan Note (Signed)
On Rx 

## 2011-01-11 NOTE — Progress Notes (Signed)
  Subjective:    Patient ID: Sabrina Page, female    DOB: 1933/10/24, 77 y.o.   MRN: [id number]  HPI The patient presents for a follow-up of  chronic hypertension, chronic dyslipidemia, type 2 pre-diabetes, asthma, depression controlled with medicines    Review of Systems  Constitutional: Negative for chills, activity change, appetite change, fatigue and unexpected weight change.  HENT: Negative for congestion, mouth sores and sinus pressure.   Eyes: Negative for visual disturbance.  Respiratory: Negative for cough and chest tightness.   Gastrointestinal: Negative for nausea and abdominal pain.  Genitourinary: Negative for frequency, difficulty urinating and vaginal pain.  Musculoskeletal: Negative for back pain and gait problem.  Skin: Negative for pallor and rash.  Neurological: Negative for dizziness, tremors, weakness, numbness and headaches.  Psychiatric/Behavioral: Negative for confusion and sleep disturbance. The patient is nervous/anxious.        Objective:   Physical Exam  Constitutional: She appears well-developed. No distress.       obese  HENT:  Head: Normocephalic.  Right Ear: External ear normal.  Left Ear: External ear normal.  Nose: Nose normal.  Mouth/Throat: Oropharynx is clear and moist.  Eyes: Conjunctivae are normal. Pupils are equal, round, and reactive to light. Right eye exhibits no discharge. Left eye exhibits no discharge.  Neck: Normal range of motion. Neck supple. No JVD present. No tracheal deviation present. No thyromegaly present.  Cardiovascular: Normal rate, regular rhythm and normal heart sounds.   Pulmonary/Chest: No stridor. No respiratory distress. She has no wheezes.  Abdominal: Soft. Bowel sounds are normal. She exhibits no distension and no mass. There is no tenderness. There is no rebound and no guarding.  Musculoskeletal: She exhibits no edema and no tenderness.  Lymphadenopathy:    She has no cervical adenopathy.    Neurological: She displays normal reflexes. No cranial nerve deficit. She exhibits normal muscle tone. Coordination normal.  Skin: No rash noted. No erythema.  Psychiatric: She has a normal mood and affect. Her behavior is normal. Judgment and thought content normal.     Moles on face     Assessment & Plan:

## 2011-01-11 NOTE — Assessment & Plan Note (Signed)
Cont Rx 

## 2011-01-11 NOTE — Assessment & Plan Note (Signed)
Monitoring

## 2011-01-11 NOTE — Assessment & Plan Note (Signed)
On Rx BP Readings from Last 3 Encounters:  01/11/11 120/80  10/07/10 122/88  08/03/10 138/80

## 2011-01-11 NOTE — Assessment & Plan Note (Signed)
Schedule bx

## 2011-02-18 ENCOUNTER — Ambulatory Visit (INDEPENDENT_AMBULATORY_CARE_PROVIDER_SITE_OTHER): Payer: 59 | Admitting: *Deleted

## 2011-02-18 DIAGNOSIS — Z23 Encounter for immunization: Secondary | ICD-10-CM

## 2011-03-05 ENCOUNTER — Other Ambulatory Visit: Payer: Self-pay | Admitting: Internal Medicine

## 2011-03-05 DIAGNOSIS — Z1231 Encounter for screening mammogram for malignant neoplasm of breast: Secondary | ICD-10-CM

## 2011-03-29 ENCOUNTER — Ambulatory Visit
Admission: RE | Admit: 2011-03-29 | Discharge: 2011-03-29 | Disposition: A | Discharge status: Home / Self Care | Payer: 59 | Source: Ambulatory Visit | Attending: Internal Medicine | Admitting: Internal Medicine

## 2011-03-29 DIAGNOSIS — Z1231 Encounter for screening mammogram for malignant neoplasm of breast: Secondary | ICD-10-CM

## 2011-04-22 ENCOUNTER — Other Ambulatory Visit (INDEPENDENT_AMBULATORY_CARE_PROVIDER_SITE_OTHER): Payer: 59

## 2011-04-22 ENCOUNTER — Encounter: Payer: Self-pay | Admitting: Internal Medicine

## 2011-04-22 ENCOUNTER — Ambulatory Visit (INDEPENDENT_AMBULATORY_CARE_PROVIDER_SITE_OTHER): Payer: 59 | Admitting: Internal Medicine

## 2011-04-22 VITALS — BP 122/74 | HR 76 | Temp 97.5°F | Ht 64.0 in | Wt 238.0 lb

## 2011-04-22 DIAGNOSIS — E559 Vitamin D deficiency, unspecified: Secondary | ICD-10-CM

## 2011-04-22 DIAGNOSIS — F329 Major depressive disorder, single episode, unspecified: Secondary | ICD-10-CM

## 2011-04-22 DIAGNOSIS — R5381 Other malaise: Secondary | ICD-10-CM

## 2011-04-22 DIAGNOSIS — F3289 Other specified depressive episodes: Secondary | ICD-10-CM

## 2011-04-22 DIAGNOSIS — R0600 Dyspnea, unspecified: Secondary | ICD-10-CM

## 2011-04-22 DIAGNOSIS — R5383 Other fatigue: Secondary | ICD-10-CM

## 2011-04-22 DIAGNOSIS — D485 Neoplasm of uncertain behavior of skin: Secondary | ICD-10-CM

## 2011-04-22 DIAGNOSIS — R739 Hyperglycemia, unspecified: Secondary | ICD-10-CM | POA: Insufficient documentation

## 2011-04-22 DIAGNOSIS — R209 Unspecified disturbances of skin sensation: Secondary | ICD-10-CM

## 2011-04-22 DIAGNOSIS — R7309 Other abnormal glucose: Secondary | ICD-10-CM

## 2011-04-22 DIAGNOSIS — R0989 Other specified symptoms and signs involving the circulatory and respiratory systems: Secondary | ICD-10-CM

## 2011-04-22 DIAGNOSIS — R202 Paresthesia of skin: Secondary | ICD-10-CM

## 2011-04-22 DIAGNOSIS — R0609 Other forms of dyspnea: Secondary | ICD-10-CM

## 2011-04-22 DIAGNOSIS — E550 Rickets, active: Secondary | ICD-10-CM

## 2011-04-22 DIAGNOSIS — J45909 Unspecified asthma, uncomplicated: Secondary | ICD-10-CM

## 2011-04-22 DIAGNOSIS — R531 Weakness: Secondary | ICD-10-CM | POA: Insufficient documentation

## 2011-04-22 DIAGNOSIS — Z79899 Other long term (current) drug therapy: Secondary | ICD-10-CM

## 2011-04-22 LAB — BASIC METABOLIC PANEL
BUN: 19 mg/dL (ref 6–23)
CO2: 32 mEq/L (ref 19–32)
Calcium: 9 mg/dL (ref 8.4–10.5)
Chloride: 101 mEq/L (ref 96–112)
Creatinine, Ser: 0.8 mg/dL (ref 0.4–1.2)
GFR: 76.41 mL/min (ref >60.00)
Glucose, Bld: 82 mg/dL (ref 70–99)
Potassium: 4.5 mEq/L (ref 3.5–5.1)
Sodium: 140 mEq/L (ref 135–145)

## 2011-04-22 LAB — URINALYSIS
Bilirubin Urine: NEGATIVE
Hgb urine dipstick: NEGATIVE
Ketones, ur: NEGATIVE
Leukocytes, UA: NEGATIVE
Nitrite: NEGATIVE
Specific Gravity, Urine: 1.015 (ref 1.000–1.030)
Total Protein, Urine: NEGATIVE
Urine Glucose: NEGATIVE
Urobilinogen, UA: 0.2 (ref 0.0–1.0)
pH: 6 (ref 5.0–8.0)

## 2011-04-22 LAB — CK: Total CK: 99 U/L (ref 7–177)

## 2011-04-22 LAB — CBC WITH DIFFERENTIAL/PLATELET
Basophils Absolute: 0 10*3/uL (ref 0.0–0.1)
Basophils Relative: 0.5 % (ref 0.0–3.0)
Eosinophils Absolute: 0.2 10*3/uL (ref 0.0–0.7)
Eosinophils Relative: 3 % (ref 0.0–5.0)
HCT: 44 % (ref 36.0–46.0)
Hemoglobin: 14.8 g/dL (ref 12.0–15.0)
Lymphocytes Relative: 32.8 % (ref 12.0–46.0)
Lymphs Abs: 1.9 10*3/uL (ref 0.7–4.0)
MCHC: 33.7 g/dL (ref 30.0–36.0)
MCV: 87.6 fl (ref 78.0–100.0)
Monocytes Absolute: 0.5 10*3/uL (ref 0.1–1.0)
Monocytes Relative: 8.1 % (ref 3.0–12.0)
Neutro Abs: 3.3 10*3/uL (ref 1.4–7.7)
Neutrophils Relative %: 55.6 % (ref 43.0–77.0)
Platelets: 167 10*3/uL (ref 150.0–400.0)
RBC: 5.02 Mil/uL (ref 3.87–5.11)
RDW: 13.8 % (ref 11.5–14.6)
WBC: 5.9 10*3/uL (ref 4.5–10.5)

## 2011-04-22 LAB — HEPATIC FUNCTION PANEL
ALT: 20 U/L (ref 0–35)
AST: 19 U/L (ref 0–37)
Albumin: 4 g/dL (ref 3.5–5.2)
Alkaline Phosphatase: 64 U/L (ref 39–117)
Bilirubin, Direct: 0.1 mg/dL (ref 0.0–0.3)
Total Bilirubin: 0.7 mg/dL (ref 0.3–1.2)
Total Protein: 7.1 g/dL (ref 6.0–8.3)

## 2011-04-22 LAB — HEMOGLOBIN A1C: Hgb A1c MFr Bld: 5.8 % (ref 4.6–6.5)

## 2011-04-22 LAB — VITAMIN B12: Vitamin B-12: 454 pg/mL (ref 211–911)

## 2011-04-22 LAB — TSH: TSH: 1.92 u[IU]/mL (ref 0.35–5.50)

## 2011-04-22 LAB — BRAIN NATRIURETIC PEPTIDE: Pro B Natriuretic peptide (BNP): 35 pg/mL (ref 0.0–100.0)

## 2011-04-22 LAB — SEDIMENTATION RATE: Sed Rate: 10 mm/hr (ref 0–22)

## 2011-04-22 LAB — MAGNESIUM: Magnesium: 2.3 mg/dL (ref 1.5–2.5)

## 2011-04-22 MED ORDER — BUPROPION HCL ER (SR) 100 MG PO TB12: 100.0000 mg | ORAL_TABLET | Freq: Two times a day (BID) | ORAL | Status: DC

## 2011-04-22 MED ORDER — FUROSEMIDE 20 MG PO TABS: 20.0000 mg | ORAL_TABLET | Freq: Every day | ORAL | Status: DC

## 2011-04-22 MED ORDER — MOMETASONE FURO-FORMOTEROL FUM 200-5 MCG/ACT IN AERO: 2.0000 | INHALATION_SPRAY | RESPIRATORY_TRACT | Status: DC

## 2011-04-22 MED ORDER — LORATADINE 10 MG PO TABS: 10.0000 mg | ORAL_TABLET | Freq: Every day | ORAL | Status: DC

## 2011-04-22 MED ORDER — POTASSIUM CHLORIDE ER 8 MEQ PO TBCR: 8.0000 meq | EXTENDED_RELEASE_TABLET | Freq: Every day | ORAL | Status: DC

## 2011-04-22 NOTE — Assessment & Plan Note (Signed)
Skin bx 

## 2011-04-22 NOTE — Progress Notes (Signed)
  Subjective:    Patient ID: Sabrina Page, female    DOB: 1933/06/06, 77 y.o.   MRN: [id number]  HPI  The patient presents for a follow-up of  chronic hypertension, chronic dyslipidemia, type 2 pre-diabetes controlled with medicines. C/o weakness, dizziness, depressed mood. C/o wt gain  Wt Readings from Last 3 Encounters:  04/22/11 238 lb (107.956 kg)  01/11/11 230 lb (104.327 kg)  10/07/10 225 lb (102.059 kg)       Review of Systems  Constitutional: Positive for fatigue. Negative for chills, activity change, appetite change and unexpected weight change.  HENT: Negative for congestion, mouth sores and sinus pressure.   Eyes: Negative for visual disturbance.  Respiratory: Positive for wheezing. Negative for cough and chest tightness.   Gastrointestinal: Negative for nausea and abdominal pain.  Genitourinary: Negative for frequency, difficulty urinating and vaginal pain.  Musculoskeletal: Negative for back pain and gait problem.  Skin: Negative for pallor and rash.  Neurological: Negative for dizziness, tremors, weakness, numbness and headaches.  Psychiatric/Behavioral: Positive for sleep disturbance. Negative for confusion. The patient is nervous/anxious.        Objective:   Physical Exam  Constitutional: She appears well-developed. No distress.       Obese  HENT:  Head: Normocephalic.  Right Ear: External ear normal.  Left Ear: External ear normal.  Nose: Nose normal.  Mouth/Throat: Oropharynx is clear and moist.  Eyes: Conjunctivae are normal. Pupils are equal, round, and reactive to light. Right eye exhibits no discharge. Left eye exhibits no discharge.  Neck: Normal range of motion. Neck supple. No JVD present. No tracheal deviation present. No thyromegaly present.  Cardiovascular: Normal rate, regular rhythm and normal heart sounds.   Pulmonary/Chest: No stridor. No respiratory distress. She has no wheezes.  Abdominal: Soft. Bowel sounds are normal. She  exhibits no distension and no mass. There is no tenderness. There is no rebound and no guarding.  Musculoskeletal: She exhibits no edema and no tenderness.  Lymphadenopathy:    She has no cervical adenopathy.  Neurological: She displays normal reflexes. No cranial nerve deficit. She exhibits normal muscle tone. Coordination normal.  Skin: No rash noted. No erythema.  Psychiatric: She has a normal mood and affect. Her behavior is normal. Judgment and thought content normal.          Assessment & Plan:

## 2011-04-22 NOTE — Assessment & Plan Note (Signed)
See meds 

## 2011-04-23 LAB — VITAMIN D 25 HYDROXY (VIT D DEFICIENCY, FRACTURES): Vit D, 25-Hydroxy: 43 ng/mL (ref 30–89)

## 2011-04-26 ENCOUNTER — Other Ambulatory Visit: Payer: Self-pay | Admitting: *Deleted

## 2011-04-26 MED ORDER — POTASSIUM CHLORIDE ER 8 MEQ PO TBCR: 8.0000 meq | EXTENDED_RELEASE_TABLET | Freq: Every day | ORAL | Status: DC

## 2011-04-26 MED ORDER — MOMETASONE FURO-FORMOTEROL FUM 200-5 MCG/ACT IN AERO: 2.0000 | INHALATION_SPRAY | RESPIRATORY_TRACT | Status: DC

## 2011-05-05 NOTE — Assessment & Plan Note (Signed)
Continue with current prescription therapy as reflected on the Med list.  

## 2011-05-05 NOTE — Assessment & Plan Note (Signed)
Start exercising Labs

## 2011-05-14 ENCOUNTER — Encounter: Payer: Self-pay | Admitting: Internal Medicine

## 2011-05-14 ENCOUNTER — Ambulatory Visit (INDEPENDENT_AMBULATORY_CARE_PROVIDER_SITE_OTHER): Payer: 59 | Admitting: Internal Medicine

## 2011-05-14 VITALS — BP 140/88 | HR 68 | Temp 98.0°F | Resp 16 | Wt 237.0 lb

## 2011-05-14 DIAGNOSIS — R609 Edema, unspecified: Secondary | ICD-10-CM

## 2011-05-14 DIAGNOSIS — R0989 Other specified symptoms and signs involving the circulatory and respiratory systems: Secondary | ICD-10-CM

## 2011-05-14 DIAGNOSIS — M79605 Pain in left leg: Secondary | ICD-10-CM

## 2011-05-14 DIAGNOSIS — R42 Dizziness and giddiness: Secondary | ICD-10-CM

## 2011-05-14 DIAGNOSIS — R0600 Dyspnea, unspecified: Secondary | ICD-10-CM

## 2011-05-14 DIAGNOSIS — Z9181 History of falling: Secondary | ICD-10-CM

## 2011-05-14 DIAGNOSIS — M79604 Pain in right leg: Secondary | ICD-10-CM | POA: Insufficient documentation

## 2011-05-14 DIAGNOSIS — J45909 Unspecified asthma, uncomplicated: Secondary | ICD-10-CM

## 2011-05-14 DIAGNOSIS — R0609 Other forms of dyspnea: Secondary | ICD-10-CM

## 2011-05-14 DIAGNOSIS — M545 Low back pain, unspecified: Secondary | ICD-10-CM

## 2011-05-14 DIAGNOSIS — R296 Repeated falls: Secondary | ICD-10-CM | POA: Insufficient documentation

## 2011-05-14 MED ORDER — METHYLPREDNISOLONE ACETATE PF 80 MG/ML IJ SUSP
120.0000 mg | Freq: Once | INTRAMUSCULAR | Status: AC
  Administered 2011-05-14: 120 mg via INTRAMUSCULAR

## 2011-05-14 NOTE — Assessment & Plan Note (Signed)
MRI LS spine Walker Head MRI if needed

## 2011-05-14 NOTE — Assessment & Plan Note (Signed)
Seem to be more of an  Ataxia issue

## 2011-05-14 NOTE — Progress Notes (Signed)
  Subjective:    Patient ID: Sabrina Page, female    DOB: August 24, 1933, 77 y.o.   MRN: [id number]  HPI  The patient is here to follow up on chronic depression, anxiety, headaches, dizziness and chronic moderate fibromyalgia symptoms not controlled with medicines. She gets no exercise. C/o dizziness and falls - no LOC.  Wt Readings from Last 3 Encounters:  05/14/11 237 lb (107.502 kg)  04/22/11 238 lb (107.956 kg)  01/11/11 230 lb (104.327 kg)       Review of Systems  Constitutional: Positive for fatigue. Negative for chills, activity change, appetite change and unexpected weight change.  HENT: Negative for congestion, mouth sores and sinus pressure.   Eyes: Negative for visual disturbance.  Respiratory: Negative for cough and chest tightness.   Gastrointestinal: Negative for nausea and abdominal pain.  Genitourinary: Negative for frequency, difficulty urinating and vaginal pain.  Musculoskeletal: Negative for back pain and gait problem.  Skin: Negative for pallor and rash.  Neurological: Positive for dizziness and weakness (LEs). Negative for tremors, seizures, syncope, speech difficulty, light-headedness, numbness and headaches.  Psychiatric/Behavioral: Negative for suicidal ideas, hallucinations, confusion and sleep disturbance. The patient is nervous/anxious.        Objective:   Physical Exam  Constitutional: She is oriented to person, place, and time. She appears well-developed. No distress.       Obese   HENT:  Head: Normocephalic.  Right Ear: External ear normal.  Left Ear: External ear normal.  Nose: Nose normal.  Mouth/Throat: Oropharynx is clear and moist.  Eyes: Conjunctivae are normal. Pupils are equal, round, and reactive to light. Right eye exhibits no discharge. Left eye exhibits no discharge.  Neck: Normal range of motion. Neck supple. No JVD present. No tracheal deviation present. No thyromegaly present.  Cardiovascular: Normal rate, regular rhythm and  normal heart sounds.   Pulmonary/Chest: No stridor. No respiratory distress. She has no wheezes.  Abdominal: Soft. Bowel sounds are normal. She exhibits no distension and no mass. There is no tenderness. There is no rebound and no guarding.  Musculoskeletal: She exhibits no edema and no tenderness.  Lymphadenopathy:    She has no cervical adenopathy.  Neurological: She is alert and oriented to person, place, and time. She displays abnormal reflex (decr LEs). No cranial nerve deficit. She exhibits normal muscle tone. Coordination abnormal.       Leg extensors are 5-/5  Skin: No rash noted. No erythema.  Psychiatric: She has a normal mood and affect. Her behavior is normal. Judgment and thought content normal.          Assessment & Plan:   A complex case

## 2011-05-14 NOTE — Assessment & Plan Note (Signed)
BNP, BMET

## 2011-05-14 NOTE — Assessment & Plan Note (Signed)
Continue with current prescription therapy as reflected on the Med list.  

## 2011-05-14 NOTE — Assessment & Plan Note (Signed)
Better  

## 2011-05-14 NOTE — Patient Instructions (Signed)
Stop Wellbutrin Use a walker

## 2011-05-14 NOTE — Assessment & Plan Note (Signed)
12/12 Head CT was OK  ?spinal stenosis May need an MRI brain

## 2011-05-15 ENCOUNTER — Encounter: Payer: Self-pay | Admitting: Internal Medicine

## 2011-05-26 ENCOUNTER — Ambulatory Visit
Admission: RE | Admit: 2011-05-26 | Discharge: 2011-05-26 | Disposition: A | Discharge status: Home / Self Care | Payer: 59 | Source: Ambulatory Visit | Attending: Internal Medicine | Admitting: Internal Medicine

## 2011-05-26 DIAGNOSIS — M545 Low back pain: Secondary | ICD-10-CM

## 2011-05-26 DIAGNOSIS — R296 Repeated falls: Secondary | ICD-10-CM

## 2011-05-26 DIAGNOSIS — M79604 Pain in right leg: Secondary | ICD-10-CM

## 2011-05-26 DIAGNOSIS — M79605 Pain in left leg: Secondary | ICD-10-CM

## 2011-06-01 ENCOUNTER — Ambulatory Visit (INDEPENDENT_AMBULATORY_CARE_PROVIDER_SITE_OTHER): Payer: 59 | Admitting: Internal Medicine

## 2011-06-01 ENCOUNTER — Encounter: Payer: Self-pay | Admitting: Internal Medicine

## 2011-06-01 VITALS — BP 118/80 | HR 76 | Temp 98.4°F | Resp 16 | Wt 236.0 lb

## 2011-06-01 DIAGNOSIS — I10 Essential (primary) hypertension: Secondary | ICD-10-CM

## 2011-06-01 DIAGNOSIS — Z9181 History of falling: Secondary | ICD-10-CM

## 2011-06-01 DIAGNOSIS — M545 Low back pain, unspecified: Secondary | ICD-10-CM

## 2011-06-01 DIAGNOSIS — M48061 Spinal stenosis, lumbar region without neurogenic claudication: Secondary | ICD-10-CM

## 2011-06-01 DIAGNOSIS — R296 Repeated falls: Secondary | ICD-10-CM

## 2011-06-01 DIAGNOSIS — M79604 Pain in right leg: Secondary | ICD-10-CM

## 2011-06-01 DIAGNOSIS — M79605 Pain in left leg: Secondary | ICD-10-CM

## 2011-06-01 DIAGNOSIS — F411 Generalized anxiety disorder: Secondary | ICD-10-CM

## 2011-06-01 MED ORDER — METHYLPREDNISOLONE ACETATE PF 80 MG/ML IJ SUSP
120.0000 mg | Freq: Once | INTRAMUSCULAR | Status: AC
  Administered 2011-06-01: 120 mg via INTRAMUSCULAR

## 2011-06-01 NOTE — Assessment & Plan Note (Signed)
Better  

## 2011-06-01 NOTE — Assessment & Plan Note (Signed)
Worse in 12/12 Spinal stenosis MRI 1/13: IMPRESSION:  1. Severe multifactorial spinal and lateral recess stenosis at L4-  L5 plus moderate right foraminal stenosis.  2. Borderline to mild multifactorial spinal stenosis at T11-T12  and L3-L4.  3. Mild to moderate multifactorial left L5 foraminal stenosis.  Original Report Authenticated By: Harley Hallmark, M.D.  Options discussed

## 2011-06-01 NOTE — Assessment & Plan Note (Signed)
Continue with current prescription therapy as reflected on the Med list.  

## 2011-06-01 NOTE — Assessment & Plan Note (Signed)
12/12 Head CT was OK  Severe L4-5 spinal stenosis

## 2011-06-01 NOTE — Progress Notes (Signed)
Subjective:    Patient ID: Sabrina Page, female    DOB: [birth year], 78 y.o.   MRN: [id number]  HPI  F/u LE weakness leading to ataxia and falls. A little better after Depo-Medrol inj. No more falls. F/u asthma, dizziness - better.  Past Medical History  Diagnosis Date  . Allergy     rhinitis  . Asthma   . Hyperlipidemia   . Hypertension   . Depression   . GERD (gastroesophageal reflux disease)   . Osteoarthritis   . Obesity   . Urinary incontinence   . Shoulder injury     left   Past Surgical History  Procedure Date  . Right left knee arthroplast   . L tkr     reports that she has quit smoking. She does not have any smokeless tobacco history on file. She reports that she does not drink alcohol or use illicit drugs. family history includes Arthritis in her mother; Diabetes in her mother; Hypertension in her father, mother, and other; and Stroke in her father. Allergies  Allergen Reactions  . Atorvastatin     REACTION: upset stomach  . Enalapril Maleate   . Hctz (Hydrochlorothiazide)     Dizzy   . Lovastatin     REACTION: tongue stress  . Simvastatin     REACTION: mouth sores   Current Outpatient Prescriptions on File Prior to Visit  Medication Sig Dispense Refill  . ALPRAZolam (XANAX) 0.5 MG tablet Take 0.5 mg by mouth 2 (two) times daily as needed.        Marland Kitchen aspirin 81 MG tablet Take 81 mg by mouth daily.        . B Complex-C (SUPER B COMPLEX PO) Take by mouth daily.        . Cholecalciferol 1000 UNITS capsule Take 1,000 Units by mouth daily.        . furosemide (LASIX) 20 MG tablet Take 1 tablet (20 mg total) by mouth daily.  30 tablet  11  . loratadine (CLARITIN) 10 MG tablet Take 1 tablet (10 mg total) by mouth daily.  30 tablet  5  . Mometasone Furo-Formoterol Fum 200-5 MCG/ACT AERO Inhale 2 puffs into the lungs 1 day or 1 dose.  1 Inhaler  11  . naproxen (NAPROSYN) 500 MG tablet Take 500 mg by mouth 2 (two) times daily with a meal.        . Omega-3  Fatty Acids (FISH OIL) 1000 MG CAPS Take 1 capsule by mouth daily.        Marland Kitchen omeprazole (PRILOSEC) 20 MG capsule TAKE 1 CAPSULE BY MOUTH EVERY DAY  30 capsule  5  . potassium chloride (KLOR-CON) 8 MEQ tablet Take 1 tablet (8 mEq total) by mouth daily.  30 tablet  11  . triamcinolone (KENALOG) 0.1 % paste Place onto teeth 4 (four) times daily as needed.         BP 118/80  Pulse 76  Temp(Src) 98.4 F (36.9 C) (Oral)  Resp 16  Wt 236 lb (107.049 kg)   Review of Systems  Constitutional: Negative for fever, chills, activity change, appetite change, fatigue and unexpected weight change.  HENT: Negative for congestion, mouth sores and sinus pressure.   Eyes: Negative for visual disturbance.  Respiratory: Negative for cough and chest tightness.   Gastrointestinal: Negative for nausea and abdominal pain.  Genitourinary: Negative for frequency, difficulty urinating and vaginal pain.  Musculoskeletal: Positive for gait problem. Negative for myalgias and back pain.  Skin: Negative  for pallor and rash.  Neurological: Negative for dizziness, tremors, weakness, numbness and headaches.  Psychiatric/Behavioral: Negative for confusion and sleep disturbance. The patient is nervous/anxious.    Wt Readings from Last 3 Encounters:  06/01/11 236 lb (107.049 kg)  05/14/11 237 lb (107.502 kg)  04/22/11 238 lb (107.956 kg)       Objective:   Physical Exam  Constitutional: She appears well-developed. No distress.  HENT:  Head: Normocephalic.  Right Ear: External ear normal.  Left Ear: External ear normal.  Nose: Nose normal.  Mouth/Throat: Oropharynx is clear and moist.  Eyes: Conjunctivae are normal. Pupils are equal, round, and reactive to light. Right eye exhibits no discharge. Left eye exhibits no discharge.  Neck: Normal range of motion. Neck supple. No JVD present. No tracheal deviation present. No thyromegaly present.  Cardiovascular: Normal rate, regular rhythm and normal heart sounds.     Pulmonary/Chest: No stridor. No respiratory distress. She has no wheezes.  Abdominal: Soft. Bowel sounds are normal. She exhibits no distension and no mass. There is no tenderness. There is no rebound and no guarding.  Musculoskeletal: She exhibits no edema and no tenderness.  Lymphadenopathy:    She has no cervical adenopathy.  Neurological: She displays abnormal reflex. No cranial nerve deficit. She exhibits normal muscle tone. Coordination abnormal.  Skin: No rash noted. No erythema.  Psychiatric: She has a normal mood and affect. Her behavior is normal. Judgment and thought content normal.      MRI 1/13: IMPRESSION:  1. Severe multifactorial spinal and lateral recess stenosis at L4-  L5 plus moderate right foraminal stenosis.  2. Borderline to mild multifactorial spinal stenosis at T11-T12  and L3-L4.  3. Mild to moderate multifactorial left L5 foraminal stenosis.   Original Report Authenticated By: Harley Hallmark, M.D. Clinical Data: Larey Seat. Hit head.    CT HEAD WITHOUT CONTRAST 12/11 CT CERVICAL SPINE WITHOUT CONTRAST  Technique: Multidetector CT imaging of the head and cervical spine  was performed following the standard protocol without intravenous  contrast. Multiplanar CT image reconstructions of the cervical  spine were also generated.  Comparison: None  CT HEAD  Findings: There is mild age related cerebral atrophy and  ventriculomegaly. No extra-axial fluid collections are seen. No  CT findings for hemispheric infarction or intracranial hemorrhage.  No mass lesions. The brainstem and cerebellum are unremarkable.  The bony calvarium is intact. Benign appearing calvarial lesion  noted in the right frontal area. The paranasal sinuses and mastoid  air cells are clear. The globes are intact.  IMPRESSION:  1. Age related cerebral atrophy and ventriculomegaly.  2. No acute intracranial findings or mass lesions.  3. No skull fracture.  CT CERVICAL SPINE  Findings:  Advanced degenerative cervical spondylosis with disc  disease and facet disease most notable at C4-5, C5-6 and C6-7. The  facets are normally aligned. No facet or laminar fractures. No  acute vertebral body fractures or abnormal prevertebral soft tissue  swelling. The C1-2 articulations are maintained. The dens appears  normal.  No large disc protrusions are seen. There is moderate osteophytic  spurring posteriorly at C5-6 and C6-7 with mild mass effect on the  thecal sac. Mild bilateral foraminal stenosis at C5-6 and C6-7.  IMPRESSION:  1. Degenerative cervical spondylosis with disc disease and facet  disease.  2. No acute bony findings and normal alignment.  Provider: Judie Petit        Assessment & Plan:

## 2011-06-05 ENCOUNTER — Other Ambulatory Visit: Payer: Self-pay | Admitting: Internal Medicine

## 2011-06-22 ENCOUNTER — Other Ambulatory Visit: Payer: Self-pay | Admitting: Gastroenterology

## 2011-06-22 DIAGNOSIS — R1011 Right upper quadrant pain: Secondary | ICD-10-CM | POA: Diagnosis not present

## 2011-06-22 DIAGNOSIS — K449 Diaphragmatic hernia without obstruction or gangrene: Secondary | ICD-10-CM | POA: Diagnosis not present

## 2011-06-22 DIAGNOSIS — K5730 Diverticulosis of large intestine without perforation or abscess without bleeding: Secondary | ICD-10-CM | POA: Diagnosis not present

## 2011-07-01 ENCOUNTER — Ambulatory Visit (HOSPITAL_COMMUNITY)
Admission: RE | Admit: 2011-07-01 | Discharge: 2011-07-01 | Disposition: A | Discharge status: Home / Self Care | Payer: PRIVATE HEALTH INSURANCE | Source: Ambulatory Visit | Attending: Gastroenterology | Admitting: Gastroenterology

## 2011-07-01 ENCOUNTER — Encounter (HOSPITAL_COMMUNITY)
Admission: RE | Admit: 2011-07-01 | Discharge: 2011-07-01 | Disposition: A | Discharge status: Home / Self Care | Payer: PRIVATE HEALTH INSURANCE | Source: Ambulatory Visit | Attending: Gastroenterology | Admitting: Gastroenterology

## 2011-07-01 DIAGNOSIS — R1011 Right upper quadrant pain: Secondary | ICD-10-CM

## 2011-07-01 DIAGNOSIS — K8020 Calculus of gallbladder without cholecystitis without obstruction: Secondary | ICD-10-CM | POA: Insufficient documentation

## 2011-07-01 DIAGNOSIS — Q619 Cystic kidney disease, unspecified: Secondary | ICD-10-CM | POA: Insufficient documentation

## 2011-07-21 ENCOUNTER — Encounter (INDEPENDENT_AMBULATORY_CARE_PROVIDER_SITE_OTHER): Payer: Self-pay | Admitting: General Surgery

## 2011-07-21 ENCOUNTER — Ambulatory Visit (INDEPENDENT_AMBULATORY_CARE_PROVIDER_SITE_OTHER): Payer: PRIVATE HEALTH INSURANCE | Admitting: General Surgery

## 2011-07-21 VITALS — BP 138/90 | HR 80 | Temp 97.8°F | Resp 16 | Ht 64.0 in | Wt 240.4 lb

## 2011-07-21 DIAGNOSIS — K8020 Calculus of gallbladder without cholecystitis without obstruction: Secondary | ICD-10-CM

## 2011-07-21 NOTE — Patient Instructions (Signed)
Strict lowfat diet. 

## 2011-07-21 NOTE — Progress Notes (Signed)
Patient ID: Sabrina Page, female   DOB: June 22, 1933, 78 y.o.   MRN: [id number]  Chief Complaint  Patient presents with  . Abdominal Pain    new pt- ebal GB w/ stones    HPI Sabrina Page is a 78 y.o. female.   HPI  She is referred by Dr. Loreta Ave for further evaluation and treatment of RUQ pain and gallstones.  She has crampy RUQ pain after eating a fatty meal.  An US demonstrated a gallstone and findings consistent with a fatty liver.  When she does not eat fatty foods, she does not get the pain.  The pain lasts for about 20 minutes then it resolves.  No vomiting. She is [place] and speaks some [place].  Her daughter is here with her and is bilingual.  She is translating for her when it is needed.  Past Medical History  Diagnosis Date  . Allergy     rhinitis  . Asthma   . Hyperlipidemia   . Hypertension   . Depression   . GERD (gastroesophageal reflux disease)   . Osteoarthritis   . Obesity   . Urinary incontinence   . Shoulder injury     left  . COPD (chronic obstructive pulmonary disease)   . Osteoporosis   . Gallstones     Past Surgical History  Procedure Date  . Right left knee arthroplast   . L tkr     Family History  Problem Relation Age of Onset  . Hypertension Other   . Hypertension Mother   . Arthritis Mother   . Diabetes Mother   . Stroke Mother   . Stroke Father   . Hypertension Father     Social History History  Substance Use Topics  . Smoking status: Former Smoker    Quit date: 05/24/2005  . Smokeless tobacco: Not on file  . Alcohol Use: No    Allergies  Allergen Reactions  . Atorvastatin     REACTION: upset stomach  . Enalapril Maleate   . Hctz (Hydrochlorothiazide)     Dizzy   . Lovastatin     REACTION: tongue stress  . Simvastatin     REACTION: mouth sores    Current Outpatient Prescriptions  Medication Sig Dispense Refill  . ALPRAZolam (XANAX) 0.5 MG tablet Take 0.5 mg by mouth 2 (two) times daily as needed.          Marland Kitchen aspirin 81 MG tablet Take 81 mg by mouth daily.        . B Complex-C (SUPER B COMPLEX PO) Take by mouth daily.        . Cholecalciferol 1000 UNITS capsule Take 1,000 Units by mouth daily.        . furosemide (LASIX) 20 MG tablet Take 1 tablet (20 mg total) by mouth daily.  30 tablet  11  . loratadine (CLARITIN) 10 MG tablet Take 1 tablet (10 mg total) by mouth daily.  30 tablet  5  . Mometasone Furo-Formoterol Fum 200-5 MCG/ACT AERO Inhale 2 puffs into the lungs 1 day or 1 dose.  1 Inhaler  11  . naproxen (NAPROSYN) 500 MG tablet Take 500 mg by mouth 2 (two) times daily with a meal.        . Omega-3 Fatty Acids (FISH OIL) 1000 MG CAPS Take 1 capsule by mouth daily.        Marland Kitchen omeprazole (PRILOSEC) 20 MG capsule TAKE 1 CAPSULE BY MOUTH EVERY DAY  30 capsule  5  . potassium  chloride (KLOR-CON) 8 MEQ tablet Take 1 tablet (8 mEq total) by mouth daily.  30 tablet  11  . triamcinolone (KENALOG) 0.1 % paste Place onto teeth 4 (four) times daily as needed.          Review of Systems Review of Systems  Constitutional: Negative.   Respiratory: Positive for wheezing.   Cardiovascular: Negative.   Gastrointestinal: Positive for abdominal pain and abdominal distention. Negative for constipation.  Genitourinary: Negative.   Hematological: Negative.     Blood pressure 138/90, pulse 80, temperature 97.8 F (36.6 C), temperature source Temporal, resp. rate 16, height 5\' 4"  (1.626 m), weight 240 lb 6.4 oz (109.045 kg).  Physical Exam Physical Exam  Constitutional: No distress.       Obese female.  HENT:  Head: Normocephalic and atraumatic.  Eyes: EOM are normal. No scleral icterus.  Cardiovascular: Normal rate and regular rhythm.   Pulmonary/Chest: Effort normal and breath sounds normal.  Abdominal: Soft. She exhibits no mass. There is no tenderness.       Obese.  Musculoskeletal: She exhibits no edema.  Skin: Skin is warm and dry.    Data Reviewed Dr. Kenna Gilbert note.  [name]  report  Assessment    Symptomatic cholelithiasis.  Other comorbidities are noted but do not appear to be prohibitive.    Plan    Laparoscopic cholecystectomy.  I have explained the procedure, risks, and aftercare of cholecystectomy.  Risks include but are not limited to bleeding, infection, wound problems, anesthesia, diarrhea, bile leak, injury to common bile duct/liver/intestine.  She seems to understand and would like to proceed.        Sabrina Page J 07/21/2011, 1:59 PM

## 2011-07-27 ENCOUNTER — Ambulatory Visit: Payer: 59 | Admitting: Internal Medicine

## 2011-08-09 ENCOUNTER — Encounter: Payer: Self-pay | Admitting: Internal Medicine

## 2011-08-09 ENCOUNTER — Other Ambulatory Visit (INDEPENDENT_AMBULATORY_CARE_PROVIDER_SITE_OTHER): Payer: PRIVATE HEALTH INSURANCE

## 2011-08-09 ENCOUNTER — Ambulatory Visit (INDEPENDENT_AMBULATORY_CARE_PROVIDER_SITE_OTHER): Payer: PRIVATE HEALTH INSURANCE | Admitting: Internal Medicine

## 2011-08-09 VITALS — BP 140/90 | HR 76 | Temp 97.6°F | Resp 16 | Wt 241.0 lb

## 2011-08-09 DIAGNOSIS — M545 Low back pain, unspecified: Secondary | ICD-10-CM

## 2011-08-09 DIAGNOSIS — M79604 Pain in right leg: Secondary | ICD-10-CM

## 2011-08-09 DIAGNOSIS — F3289 Other specified depressive episodes: Secondary | ICD-10-CM

## 2011-08-09 DIAGNOSIS — I10 Essential (primary) hypertension: Secondary | ICD-10-CM

## 2011-08-09 DIAGNOSIS — R0609 Other forms of dyspnea: Secondary | ICD-10-CM

## 2011-08-09 DIAGNOSIS — J45909 Unspecified asthma, uncomplicated: Secondary | ICD-10-CM

## 2011-08-09 DIAGNOSIS — F329 Major depressive disorder, single episode, unspecified: Secondary | ICD-10-CM

## 2011-08-09 DIAGNOSIS — R0989 Other specified symptoms and signs involving the circulatory and respiratory systems: Secondary | ICD-10-CM

## 2011-08-09 DIAGNOSIS — R739 Hyperglycemia, unspecified: Secondary | ICD-10-CM

## 2011-08-09 DIAGNOSIS — R0600 Dyspnea, unspecified: Secondary | ICD-10-CM

## 2011-08-09 DIAGNOSIS — Z9181 History of falling: Secondary | ICD-10-CM

## 2011-08-09 DIAGNOSIS — R296 Repeated falls: Secondary | ICD-10-CM

## 2011-08-09 DIAGNOSIS — E785 Hyperlipidemia, unspecified: Secondary | ICD-10-CM

## 2011-08-09 DIAGNOSIS — M79605 Pain in left leg: Secondary | ICD-10-CM

## 2011-08-09 DIAGNOSIS — K8020 Calculus of gallbladder without cholecystitis without obstruction: Secondary | ICD-10-CM

## 2011-08-09 DIAGNOSIS — R7309 Other abnormal glucose: Secondary | ICD-10-CM

## 2011-08-09 LAB — HEMOGLOBIN A1C: Hgb A1c MFr Bld: 5.8 % (ref 4.6–6.5)

## 2011-08-09 LAB — TSH: TSH: 1.81 u[IU]/mL (ref 0.35–5.50)

## 2011-08-09 MED ORDER — FUROSEMIDE 20 MG PO TABS: 20.0000 mg | ORAL_TABLET | Freq: Every day | ORAL | Status: DC | PRN

## 2011-08-09 MED ORDER — MOMETASONE FURO-FORMOTEROL FUM 200-5 MCG/ACT IN AERO: 2.0000 | INHALATION_SPRAY | RESPIRATORY_TRACT | Status: DC

## 2011-08-09 MED ORDER — LOSARTAN POTASSIUM-HCTZ 100-25 MG PO TABS: 1.0000 | ORAL_TABLET | Freq: Every day | ORAL | Status: DC

## 2011-08-09 MED ORDER — ALPRAZOLAM 0.5 MG PO TABS: 0.5000 mg | ORAL_TABLET | Freq: Two times a day (BID) | ORAL | Status: DC | PRN

## 2011-08-09 NOTE — Assessment & Plan Note (Signed)
Continue with current prescription therapy as reflected on the Med list.  

## 2011-08-09 NOTE — Assessment & Plan Note (Signed)
Pulm cons Continue with current prescription therapy as reflected on the Med list.

## 2011-08-09 NOTE — Assessment & Plan Note (Signed)
2/13 Surgery pnding

## 2011-08-09 NOTE — Assessment & Plan Note (Signed)
S/p consult Continue with current prescription therapy as reflected on the Med list.

## 2011-08-09 NOTE — Assessment & Plan Note (Signed)
Discussed.

## 2011-08-09 NOTE — Progress Notes (Signed)
Patient ID: Sabrina Page, female   DOB: April 23, 1934, 78 y.o.   MRN: [id number]  Subjective:    Patient ID: Sabrina Page, female    DOB: [birth year], 78 y.o.   MRN: [id number]  HPI  F/u LE weakness leading to ataxia and falls (spinal stenosis), GS. No more falls. F/u asthma, dizziness - better.  Past Medical History  Diagnosis Date  . Allergy     rhinitis  . Asthma   . Hyperlipidemia   . Hypertension   . Depression   . GERD (gastroesophageal reflux disease)   . Osteoarthritis   . Obesity   . Urinary incontinence   . Shoulder injury     left  . COPD (chronic obstructive pulmonary disease)   . Osteoporosis   . Gallstones    Past Surgical History  Procedure Date  . Right left knee arthroplast   . L tkr     reports that she quit smoking about 6 years ago. She does not have any smokeless tobacco history on file. She reports that she does not drink alcohol or use illicit drugs. family history includes Arthritis in her mother; Diabetes in her mother; Hypertension in her father, mother, and other; and Stroke in her father and mother. Allergies  Allergen Reactions  . Atorvastatin     REACTION: upset stomach  . Enalapril Maleate   . Hctz (Hydrochlorothiazide)     Dizzy   . Lovastatin     REACTION: tongue stress  . Simvastatin     REACTION: mouth sores   Current Outpatient Prescriptions on File Prior to Visit  Medication Sig Dispense Refill  . ALPRAZolam (XANAX) 0.5 MG tablet Take 0.5 mg by mouth 2 (two) times daily as needed.        Marland Kitchen aspirin 81 MG tablet Take 81 mg by mouth daily.        . B Complex-C (SUPER B COMPLEX PO) Take by mouth daily.        . Cholecalciferol 1000 UNITS capsule Take 1,000 Units by mouth daily.        . furosemide (LASIX) 20 MG tablet Take 1 tablet (20 mg total) by mouth daily.  30 tablet  11  . loratadine (CLARITIN) 10 MG tablet Take 1 tablet (10 mg total) by mouth daily.  30 tablet  5  . Mometasone Furo-Formoterol Fum 200-5 MCG/ACT  AERO Inhale 2 puffs into the lungs 1 day or 1 dose.  1 Inhaler  11  . naproxen (NAPROSYN) 500 MG tablet Take 500 mg by mouth 2 (two) times daily with a meal.        . Omega-3 Fatty Acids (FISH OIL) 1000 MG CAPS Take 1 capsule by mouth daily.        Marland Kitchen omeprazole (PRILOSEC) 20 MG capsule TAKE 1 CAPSULE BY MOUTH EVERY DAY  30 capsule  5  . potassium chloride (KLOR-CON) 8 MEQ tablet Take 1 tablet (8 mEq total) by mouth daily.  30 tablet  11  . triamcinolone (KENALOG) 0.1 % paste Place onto teeth 4 (four) times daily as needed.         BP 140/90  Pulse 76  Temp(Src) 97.6 F (36.4 C) (Oral)  Resp 16  Wt 241 lb (109.317 kg)   Review of Systems  Constitutional: Negative for fever, chills, activity change, appetite change, fatigue and unexpected weight change.  HENT: Negative for congestion, mouth sores and sinus pressure.   Eyes: Negative for visual disturbance.  Respiratory: Negative for cough and chest tightness.  Gastrointestinal: Negative for nausea and abdominal pain.  Genitourinary: Negative for frequency, difficulty urinating and vaginal pain.  Musculoskeletal: Positive for gait problem. Negative for myalgias and back pain.  Skin: Negative for pallor and rash.  Neurological: Negative for dizziness, tremors, weakness, numbness and headaches.  Psychiatric/Behavioral: Negative for confusion and sleep disturbance. The patient is nervous/anxious.    Wt Readings from Last 3 Encounters:  08/09/11 241 lb (109.317 kg)  07/21/11 240 lb 6.4 oz (109.045 kg)  06/01/11 236 lb (107.049 kg)   BP Readings from Last 3 Encounters:  08/09/11 140/90  07/21/11 138/90  06/01/11 118/80       Objective:   Physical Exam  Constitutional: She appears well-developed. No distress.  HENT:  Head: Normocephalic.  Right Ear: External ear normal.  Left Ear: External ear normal.  Nose: Nose normal.  Mouth/Throat: Oropharynx is clear and moist.  Eyes: Conjunctivae are normal. Pupils are equal, round, and  reactive to light. Right eye exhibits no discharge. Left eye exhibits no discharge.  Neck: Normal range of motion. Neck supple. No JVD present. No tracheal deviation present. No thyromegaly present.  Cardiovascular: Normal rate, regular rhythm and normal heart sounds.   Pulmonary/Chest: No stridor. No respiratory distress. She has no wheezes.  Abdominal: Soft. Bowel sounds are normal. She exhibits no distension and no mass. There is no tenderness. There is no rebound and no guarding.  Musculoskeletal: She exhibits no edema and no tenderness.  Lymphadenopathy:    She has no cervical adenopathy.  Neurological: She displays abnormal reflex. No cranial nerve deficit. She exhibits normal muscle tone. Coordination abnormal.  Skin: No rash noted. No erythema.  Psychiatric: She has a normal mood and affect. Her behavior is normal. Judgment and thought content normal.      MRI 1/13: IMPRESSION:  1. Severe multifactorial spinal and lateral recess stenosis at L4-  L5 plus moderate right foraminal stenosis.  2. Borderline to mild multifactorial spinal stenosis at T11-T12  and L3-L4.  3. Mild to moderate multifactorial left L5 foraminal stenosis.   Original Report Authenticated By: Harley Hallmark, M.D. Clinical Data: Larey Seat. Hit head.    CT HEAD WITHOUT CONTRAST 12/11 CT CERVICAL SPINE WITHOUT CONTRAST  Technique: Multidetector CT imaging of the head and cervical spine  was performed following the standard protocol without intravenous  contrast. Multiplanar CT image reconstructions of the cervical  spine were also generated.  Comparison: None  CT HEAD  Findings: There is mild age related cerebral atrophy and  ventriculomegaly. No extra-axial fluid collections are seen. No  CT findings for hemispheric infarction or intracranial hemorrhage.  No mass lesions. The brainstem and cerebellum are unremarkable.  The bony calvarium is intact. Benign appearing calvarial lesion  noted in the right  frontal area. The paranasal sinuses and mastoid  air cells are clear. The globes are intact.  IMPRESSION:  1. Age related cerebral atrophy and ventriculomegaly.  2. No acute intracranial findings or mass lesions.  3. No skull fracture.  CT CERVICAL SPINE  Findings: Advanced degenerative cervical spondylosis with disc  disease and facet disease most notable at C4-5, C5-6 and C6-7. The  facets are normally aligned. No facet or laminar fractures. No  acute vertebral body fractures or abnormal prevertebral soft tissue  swelling. The C1-2 articulations are maintained. The dens appears  normal.  No large disc protrusions are seen. There is moderate osteophytic  spurring posteriorly at C5-6 and C6-7 with mild mass effect on the  thecal sac. Mild bilateral foraminal stenosis  at C5-6 and C6-7.  IMPRESSION:  1. Degenerative cervical spondylosis with disc disease and facet  disease.  2. No acute bony findings and normal alignment.   Provider: Judie Petit   Lab Results  Component Value Date   WBC 5.9 04/22/2011   HGB 14.8 04/22/2011   HCT 44.0 04/22/2011   PLT 167.0 04/22/2011   GLUCOSE 82 04/22/2011   CHOL 295* 04/02/2010   TRIG 152.0* 04/02/2010   HDL 50.90 04/02/2010   LDLDIRECT 217.8 04/02/2010   LDLCALC 107* 04/04/2009   ALT 20 04/22/2011   AST 19 04/22/2011   NA 140 04/22/2011   K 4.5 04/22/2011   CL 101 04/22/2011   CREATININE 0.8 04/22/2011   BUN 19 04/22/2011   CO2 32 04/22/2011   TSH 1.92 04/22/2011   HGBA1C 5.8 04/22/2011        Assessment & Plan:   Needs to loose weight

## 2011-08-09 NOTE — Assessment & Plan Note (Signed)
Watching  

## 2011-08-10 LAB — BASIC METABOLIC PANEL
BUN: 16 mg/dL (ref 6–23)
CO2: 33 mEq/L — ABNORMAL HIGH (ref 19–32)
Calcium: 9 mg/dL (ref 8.4–10.5)
Chloride: 101 mEq/L (ref 96–112)
Creatinine, Ser: 0.8 mg/dL (ref 0.4–1.2)
GFR: 70.09 mL/min (ref >60.00)
Glucose, Bld: 96 mg/dL (ref 70–99)
Potassium: 4.3 mEq/L (ref 3.5–5.1)
Sodium: 140 mEq/L (ref 135–145)

## 2011-08-30 ENCOUNTER — Institutional Professional Consult (permissible substitution): Payer: Medicare Other | Admitting: Internal Medicine

## 2011-08-31 ENCOUNTER — Encounter: Payer: Self-pay | Admitting: Internal Medicine

## 2011-08-31 ENCOUNTER — Ambulatory Visit (INDEPENDENT_AMBULATORY_CARE_PROVIDER_SITE_OTHER): Payer: Medicare Other | Admitting: Internal Medicine

## 2011-08-31 DIAGNOSIS — J45909 Unspecified asthma, uncomplicated: Secondary | ICD-10-CM

## 2011-08-31 NOTE — Patient Instructions (Signed)
Work on inhaler technique:  relax and gently blow all the way out then take a nice smooth deep breath back in, triggering the inhaler at same time you start breathing in.  Hold for up to 5 seconds if you can.  Rinse and gargle with water when done   If your mouth or throat starts to bother you,   I suggest you time the inhaler to your dental care and after using the inhaler(s) brush teeth and tongue with a baking soda containing toothpaste and when you rinse this out, gargle with it first to see if this helps your mouth and throat.     Dulera 200 Take 2 puffs first thing in am and then another 2 puffs about 12 hours later.    GERD (REFLUX)  is an extremely common cause of respiratory symptoms, many times with no significant heartburn at all.    It can be treated with medication, but also with lifestyle changes including avoidance of late meals, excessive alcohol, smoking cessation, and avoid fatty foods, chocolate, peppermint, colas, red wine, and acidic juices such as orange juice.  NO MINT OR MENTHOL PRODUCTS SO NO COUGH DROPS  USE SUGARLESS CANDY INSTEAD (jolley ranchers or Stover's)  NO OIL BASED VITAMINS - use powdered substitutes.(I don't recommend fish oil, eat salmon twice daily instead)   I will let Dr Posey Rea know of these recommendations  And he can refer you back if you are still having problems despite following these instructions

## 2011-08-31 NOTE — Assessment & Plan Note (Addendum)
-   Spirometry nl 08/31/2011 with no active symptoms  Symptoms are markedly disproportionate to objective findings and not clear this is even a lung problem but pt does appear to have difficult airway management issues and very well could have asthma we didn't detect today as denied sob on day of ov  DDX of  difficult airways managment all start with A and  include Adherence, Ace Inhibitors, Acid Reflux, Active Sinus Disease, Alpha 1 Antitripsin deficiency, Anxiety masquerading as Airways dz,  ABPA,  allergy(esp in young), Aspiration (esp in elderly), Adverse effects of DPI,  Active smokers, plus two Bs  = Bronchiectasis and Beta blocker use..and one C= CHF  Adherence is always the initial "prime suspect" and is a multilayered concern that requires a "trust but verify" approach in every patient - starting with knowing how to use medications, especially inhalers, correctly, keeping up with refills and understanding the fundamental difference between maintenance and prns vs those medications only taken for a very short course and then stopped and not refilled. This was extremely difficult to communicate thru the interpreter to patient  who clearly misunderstood instructions given to her by Dr Posey Rea presumable directly in [place].  For now rec trial of dulera 200 Take 2 puffs first thing in am and then another 2 puffs about 12 hours later.     The proper method of use, as well as anticipated side effects, of a metered-dose inhaler are discussed and demonstrated to the patient. Improved effectiveness after extensive coaching during this visit to a level of approximately  75%  ? Acid reflux > diet reviewed - low threshold to add pepcid 20 mg at bedtime if continues to have noct spells  Return to pulmonary clinic if spells continue despite consistent use of full dose dulera

## 2011-08-31 NOTE — Progress Notes (Signed)
  Subjective:    Patient ID: Sabrina Page, female    DOB: 1934/04/27  MRN: [id number]  HPI  77 yowf  Childhood asthma, resolved and did not recur as adult despite active smoking  quit smoking around 2003 at wt =210-220  Lb developed  seasonal issues with breathing  since moving to [facility] 2007   referred by Platnikov for sob  08/31/2011 to pulmonary clinic.  08/31/2011 1st pulmonary ov/ [facility] cc freq episodes of sob last one 3 d prior to OV  Couldn't sleep due to sob, never took her inhaler (dulera) instead used otc's.  avg's maybe using dulera once a day but with very poor hfa technique not sure it helps.  On day of ov denied any sob at all, denies pattern to sob x seems has more spells in spring with pollen and again in fall.  No assoc cough, over hb symtpoms    Also denies any obvious fluctuation of symptoms with weather or environmental changes or other aggravating or alleviating factors except as outlined above   Review of Systems  Constitutional: Negative for fever, chills and unexpected weight change.  HENT: Positive for congestion and sneezing. Negative for ear pain, nosebleeds, sore throat, rhinorrhea, trouble swallowing, dental problem, voice change, postnasal drip and sinus pressure.   Eyes: Negative for visual disturbance.  Respiratory: Positive for shortness of breath. Negative for cough and choking.   Cardiovascular: Negative for chest pain and leg swelling.  Gastrointestinal: Negative for vomiting, abdominal pain and diarrhea.  Genitourinary: Negative for difficulty urinating.  Musculoskeletal: Negative for arthralgias.  Skin: Negative for rash.  Neurological: Negative for tremors, syncope and headaches.  Hematological: Does not bruise/bleed easily.       Objective:   Physical Exam Obese bf answers every question  with extremely long answers through interpreter and rarely gets around to answering the question asked, even yes or no questions  Wt 239 08/31/2011   HEENT: nl  dentition, turbinates, and orophanx. Nl external ear canals without cough reflex   NECK :  without JVD/Nodes/TM/ nl carotid upstrokes bilaterally   LUNGS: no acc muscle use, clear to A and P bilaterally without cough on insp or exp maneuvers   CV:  RRR  no s3 or murmur or increase in P2, no edema   ABD:  soft and nontender with nl excursion in the supine position. No bruits or organomegaly, bowel sounds nl  MS:  warm without deformities, calf tenderness, cyanosis or clubbing  SKIN: warm and dry without lesions    NEURO:  alert, approp, no deficits         Assessment & Plan:

## 2011-09-06 ENCOUNTER — Ambulatory Visit (HOSPITAL_COMMUNITY)
Admission: RE | Admit: 2011-09-06 | Discharge: 2011-09-06 | Disposition: A | Discharge status: Home / Self Care | Payer: Medicare Other | Source: Ambulatory Visit | Attending: General Surgery | Admitting: General Surgery

## 2011-09-06 ENCOUNTER — Encounter (HOSPITAL_COMMUNITY)
Admission: RE | Admit: 2011-09-06 | Discharge: 2011-09-06 | Disposition: A | Discharge status: Home / Self Care | Payer: Medicare Other | Source: Ambulatory Visit | Attending: General Surgery | Admitting: General Surgery

## 2011-09-06 ENCOUNTER — Encounter (HOSPITAL_COMMUNITY): Payer: Self-pay

## 2011-09-06 ENCOUNTER — Encounter (HOSPITAL_COMMUNITY): Payer: Self-pay | Admitting: Pharmacy Technician

## 2011-09-06 DIAGNOSIS — K8020 Calculus of gallbladder without cholecystitis without obstruction: Secondary | ICD-10-CM | POA: Insufficient documentation

## 2011-09-06 DIAGNOSIS — Z01812 Encounter for preprocedural laboratory examination: Secondary | ICD-10-CM | POA: Insufficient documentation

## 2011-09-06 DIAGNOSIS — IMO0002 Reserved for concepts with insufficient information to code with codable children: Secondary | ICD-10-CM | POA: Insufficient documentation

## 2011-09-06 DIAGNOSIS — I10 Essential (primary) hypertension: Secondary | ICD-10-CM | POA: Insufficient documentation

## 2011-09-06 DIAGNOSIS — Z01810 Encounter for preprocedural cardiovascular examination: Secondary | ICD-10-CM | POA: Insufficient documentation

## 2011-09-06 DIAGNOSIS — Z01818 Encounter for other preprocedural examination: Secondary | ICD-10-CM | POA: Insufficient documentation

## 2011-09-06 HISTORY — DX: Shortness of breath: R06.02

## 2011-09-06 HISTORY — DX: Pneumonia, unspecified organism: J18.9

## 2011-09-06 HISTORY — DX: Sleep apnea, unspecified: G47.30

## 2011-09-06 HISTORY — DX: Peripheral vascular disease, unspecified: I73.9

## 2011-09-06 HISTORY — DX: Chronic kidney disease, unspecified: N18.9

## 2011-09-06 HISTORY — DX: Dizziness and giddiness: R42

## 2011-09-06 LAB — CBC
HCT: 45.2 % (ref 36.0–46.0)
Hemoglobin: 14.5 g/dL (ref 12.0–15.0)
MCH: 28.9 pg (ref 26.0–34.0)
MCHC: 32.1 g/dL (ref 30.0–36.0)
MCV: 90 fL (ref 78.0–100.0)
Platelets: 186 10*3/uL (ref 150–400)
RBC: 5.02 MIL/uL (ref 3.87–5.11)
RDW: 12.6 % (ref 11.5–15.5)
WBC: 4.9 10*3/uL (ref 4.0–10.5)

## 2011-09-06 LAB — DIFFERENTIAL
Basophils Absolute: 0.1 10*3/uL (ref 0.0–0.1)
Basophils Relative: 1 % (ref 0–1)
Eosinophils Absolute: 0.2 10*3/uL (ref 0.0–0.7)
Eosinophils Relative: 4 % (ref 0–5)
Lymphocytes Relative: 35 % (ref 12–46)
Lymphs Abs: 1.8 10*3/uL (ref 0.7–4.0)
Monocytes Absolute: 0.3 10*3/uL (ref 0.1–1.0)
Monocytes Relative: 7 % (ref 3–12)
Neutro Abs: 2.6 10*3/uL (ref 1.7–7.7)
Neutrophils Relative %: 53 % (ref 43–77)

## 2011-09-06 LAB — PROTIME-INR
INR: 1.07 (ref 0.00–1.49)
Prothrombin Time: 14.1 seconds (ref 11.6–15.2)

## 2011-09-06 LAB — COMPREHENSIVE METABOLIC PANEL
ALT: 16 U/L (ref 0–35)
AST: 17 U/L (ref 0–37)
Albumin: 3.7 g/dL (ref 3.5–5.2)
Alkaline Phosphatase: 75 U/L (ref 39–117)
BUN: 17 mg/dL (ref 6–23)
CO2: 32 mEq/L (ref 19–32)
Calcium: 9.3 mg/dL (ref 8.4–10.5)
Chloride: 99 mEq/L (ref 96–112)
Creatinine, Ser: 0.9 mg/dL (ref 0.50–1.10)
GFR calc Af Amer: 71 mL/min — ABNORMAL LOW (ref >90)
GFR calc non Af Amer: 61 mL/min — ABNORMAL LOW (ref >90)
Glucose, Bld: 107 mg/dL — ABNORMAL HIGH (ref 70–99)
Potassium: 4.1 mEq/L (ref 3.5–5.1)
Sodium: 137 mEq/L (ref 135–145)
Total Bilirubin: 0.5 mg/dL (ref 0.3–1.2)
Total Protein: 7.2 g/dL (ref 6.0–8.3)

## 2011-09-06 LAB — SURGICAL PCR SCREEN
MRSA, PCR: INVALID — AB
Staphylococcus aureus: INVALID — AB

## 2011-09-06 NOTE — Pre-Procedure Instructions (Signed)
09/06/11 OV with Dr Sherene Sires 08/31/11 Note in EPIC

## 2011-09-06 NOTE — Progress Notes (Signed)
09/06/11 0924  OBSTRUCTIVE SLEEP APNEA  Have you ever been diagnosed with sleep apnea through a sleep study? No  Do you snore loudly (loud enough to be heard through closed doors)?  0  Do you often feel tired, fatigued, or sleepy during the daytime? 1  Has anyone observed you stop breathing during your sleep? 1  Do you have, or are you being treated for high blood pressure? 1  BMI more than 35 kg/m2? 1  Age over 76 years old? 1  Gender: 0  Obstructive Sleep Apnea Score 5   Score 4 or greater  Updated health history

## 2011-09-06 NOTE — Patient Instructions (Signed)
[address]  09/06/2011   Your procedure is scheduled on:  09/14/11 0730am-0900am  Report to [facility] at 0530 AM.  Call this number if you have problems the morning of surgery: [phone]   Remember:   Do not eat food:After Midnight.  May have clear liquids:until Midnight .    Take these medicines the morning of surgery with A SIP OF WATER:    Do not wear jewelry, make-up or nail polish.  Do not wear lotions, powders, or perfumes.   Do not shave 48 hours prior to surgery.  Do not bring valuables to the hospital.  Contacts, dentures or bridgework may not be worn into surgery.  Leave suitcase in the car. After surgery it may be brought to your room.  For patients admitted to the hospital, checkout time is 11:00 AM the day of discharge.      Special Instructions: CHG Shower Use Special Wash: 1/2 bottle night before surgery and 1/2 bottle morning of surgery. shower chin to toes with CHG.  Wash face and private parts with regular soap.    Please read over the following fact sheets that you were given: MRSA Information, coughing and deep breathing exercises, leg exercises

## 2011-09-08 LAB — MRSA CULTURE

## 2011-09-09 ENCOUNTER — Ambulatory Visit: Payer: PRIVATE HEALTH INSURANCE | Admitting: Internal Medicine

## 2011-09-09 NOTE — Pre-Procedure Instructions (Signed)
09/09/11 daughter notified that pcr swab was negative.

## 2011-09-13 ENCOUNTER — Encounter: Payer: Self-pay | Admitting: Internal Medicine

## 2011-09-13 ENCOUNTER — Ambulatory Visit (INDEPENDENT_AMBULATORY_CARE_PROVIDER_SITE_OTHER): Payer: Medicare Other | Admitting: Internal Medicine

## 2011-09-13 VITALS — BP 130/88 | HR 80 | Temp 97.5°F | Resp 16 | Wt 233.0 lb

## 2011-09-13 DIAGNOSIS — M79604 Pain in right leg: Secondary | ICD-10-CM

## 2011-09-13 DIAGNOSIS — F329 Major depressive disorder, single episode, unspecified: Secondary | ICD-10-CM

## 2011-09-13 DIAGNOSIS — I10 Essential (primary) hypertension: Secondary | ICD-10-CM

## 2011-09-13 DIAGNOSIS — M545 Low back pain, unspecified: Secondary | ICD-10-CM

## 2011-09-13 DIAGNOSIS — M79605 Pain in left leg: Secondary | ICD-10-CM

## 2011-09-13 DIAGNOSIS — J45909 Unspecified asthma, uncomplicated: Secondary | ICD-10-CM

## 2011-09-13 DIAGNOSIS — F3289 Other specified depressive episodes: Secondary | ICD-10-CM

## 2011-09-13 DIAGNOSIS — R609 Edema, unspecified: Secondary | ICD-10-CM

## 2011-09-13 DIAGNOSIS — E785 Hyperlipidemia, unspecified: Secondary | ICD-10-CM

## 2011-09-13 DIAGNOSIS — R7309 Other abnormal glucose: Secondary | ICD-10-CM

## 2011-09-13 DIAGNOSIS — D485 Neoplasm of uncertain behavior of skin: Secondary | ICD-10-CM

## 2011-09-13 MED ORDER — MOMETASONE FURO-FORMOTEROL FUM 200-5 MCG/ACT IN AERO: 2.0000 | INHALATION_SPRAY | RESPIRATORY_TRACT | Status: DC

## 2011-09-13 MED ORDER — FLUOXETINE HCL 10 MG PO TABS: 10.0000 mg | ORAL_TABLET | Freq: Every day | ORAL | Status: DC

## 2011-09-13 NOTE — Assessment & Plan Note (Signed)
Declined statins. 

## 2011-09-13 NOTE — Assessment & Plan Note (Signed)
Skin bx 

## 2011-09-13 NOTE — Assessment & Plan Note (Signed)
Better - not taking any meds

## 2011-09-13 NOTE — Assessment & Plan Note (Signed)
Worse 3/13 Obstr/restr lung disease Pulmonary eval 08/31/2011 Wert>  Non-adherent with dulera    - Spirometry nl 08/31/2011 with no active symptoms   - hfa 75% effective p coaching.  USE MDI prior and after surgery

## 2011-09-13 NOTE — Assessment & Plan Note (Signed)
Worse in 12/12 Spinal stenosis MRI 1/13: IMPRESSION:  1. Severe multifactorial spinal and lateral recess stenosis at L4-  L5 plus moderate right foraminal stenosis.  2. Borderline to mild multifactorial spinal stenosis at T11-T12  and L3-L4.  3. Mild to moderate multifactorial left L5 foraminal stenosis.  Original Report Authenticated By: Harley Hallmark, M.D.

## 2011-09-13 NOTE — Assessment & Plan Note (Signed)
Using compr hose

## 2011-09-13 NOTE — Progress Notes (Signed)
Patient ID: Sabrina Page, female   DOB: 01/20/1934, 78 y.o.   MRN: [id number] Patient ID: Sabrina Page, female   DOB: [birth year], 78 y.o.   MRN: [id number]  Subjective:    Patient ID: Sabrina Page, female    DOB: 1934-03-09, 78 y.o.   MRN: [id number]  HPI  F/u LE weakness leading to ataxia and falls (spinal stenosis), GS - surgery tomorrow. No more falls. F/u asthma, dizziness - better.  Past Medical History  Diagnosis Date  . Allergy     rhinitis  . Asthma   . Hyperlipidemia   . Hypertension   . Depression   . GERD (gastroesophageal reflux disease)   . Osteoarthritis   . Obesity   . Urinary incontinence   . Shoulder injury     left  . COPD (chronic obstructive pulmonary disease)   . Osteoporosis   . Gallstones   . Peripheral vascular disease     swelling   . Shortness of breath     with exertion   . Pneumonia   . Sleep apnea     never diagnosed   . Chronic kidney disease     hx of cystitis   . Dizziness     vertigo    Past Surgical History  Procedure Date  . Right left knee arthroplast   . L tkr   . Other surgical history     left leg surgery   . Other surgical history     left breast surgery due to mastitis     reports that she quit smoking about 10 years ago. Her smoking use included Cigarettes. She has a 17.5 pack-year smoking history. She has never used smokeless tobacco. She reports that she drinks alcohol. She reports that she does not use illicit drugs. family history includes Arthritis in her mother; Diabetes in her mother; Hypertension in her father, mother, and other; and Stroke in her father and mother. Allergies  Allergen Reactions  . Atorvastatin     REACTION: upset stomach  . Enalapril Maleate   . Hctz (Hydrochlorothiazide)     Dizzy   . Lovastatin     REACTION: tongue stress  . Simvastatin     REACTION: mouth sores   Current Outpatient Prescriptions on File Prior to Visit  Medication Sig Dispense Refill  .  ALPRAZolam (XANAX) 0.5 MG tablet Take 0.5 mg by mouth 2 (two) times daily as needed. For anxiety      . aspirin 81 MG tablet Take 81 mg by mouth every morning.       . B Complex-C (SUPER B COMPLEX PO) Take 1 tablet by mouth every morning.       . Cholecalciferol 1000 UNITS capsule Take 1,000 Units by mouth every morning.       . furosemide (LASIX) 20 MG tablet Take 1 tablet (20 mg total) by mouth daily as needed (swelling).  30 tablet  11  . loratadine (CLARITIN) 10 MG tablet Take 10 mg by mouth every morning.      Marland Kitchen losartan-hydrochlorothiazide (HYZAAR) 100-25 MG per tablet Take 1 tablet by mouth every morning.      . Mometasone Furo-Formoterol Fum 200-5 MCG/ACT AERO Inhale 2 puffs into the lungs 1 day or 1 dose.  1 Inhaler  11  . naproxen (NAPROSYN) 500 MG tablet Take 500 mg by mouth 2 (two) times daily with a meal.        . Omega-3 Fatty Acids (FISH OIL) 1000 MG CAPS Take 1 capsule by  mouth every morning.       Marland Kitchen omeprazole (PRILOSEC) 20 MG capsule       . potassium chloride (KLOR-CON) 8 MEQ tablet Take 8 mEq by mouth every morning.      . triamcinolone (KENALOG) 0.1 % paste Place 1 application onto teeth 4 (four) times daily as needed. For pain       BP 130/88  Pulse 80  Temp(Src) 97.5 F (36.4 C) (Oral)  Resp 16  Wt 233 lb (105.688 kg)   Review of Systems  Constitutional: Negative for fever, chills, activity change, appetite change, fatigue and unexpected weight change.  HENT: Negative for congestion, mouth sores and sinus pressure.   Eyes: Negative for visual disturbance.  Respiratory: Negative for cough and chest tightness.   Gastrointestinal: Negative for nausea and abdominal pain.  Genitourinary: Negative for frequency, difficulty urinating and vaginal pain.  Musculoskeletal: Positive for gait problem. Negative for myalgias and back pain.  Skin: Negative for pallor and rash.  Neurological: Negative for dizziness, tremors, weakness, numbness and headaches.    Psychiatric/Behavioral: Negative for confusion and sleep disturbance. The patient is nervous/anxious.    Wt Readings from Last 3 Encounters:  09/13/11 233 lb (105.688 kg)  09/06/11 237 lb 4.8 oz (107.639 kg)  08/31/11 239 lb (108.41 kg)   BP Readings from Last 3 Encounters:  09/13/11 130/88  09/06/11 148/88  08/31/11 122/72       Objective:   Physical Exam  Constitutional: She appears well-developed. No distress.  HENT:  Head: Normocephalic.  Right Ear: External ear normal.  Left Ear: External ear normal.  Nose: Nose normal.  Mouth/Throat: Oropharynx is clear and moist.  Eyes: Conjunctivae are normal. Pupils are equal, round, and reactive to light. Right eye exhibits no discharge. Left eye exhibits no discharge.  Neck: Normal range of motion. Neck supple. No JVD present. No tracheal deviation present. No thyromegaly present.  Cardiovascular: Normal rate, regular rhythm and normal heart sounds.   Pulmonary/Chest: No stridor. No respiratory distress. She has no wheezes.  Abdominal: Soft. Bowel sounds are normal. She exhibits no distension and no mass. There is no tenderness. There is no rebound and no guarding.  Musculoskeletal: She exhibits no edema and no tenderness.  Lymphadenopathy:    She has no cervical adenopathy.  Neurological: She displays abnormal reflex. No cranial nerve deficit. She exhibits normal muscle tone. Coordination abnormal.  Skin: No rash noted. No erythema.  Psychiatric: She has a normal mood and affect. Her behavior is normal. Judgment and thought content normal.      MRI 1/13: IMPRESSION:  1. Severe multifactorial spinal and lateral recess stenosis at L4-  L5 plus moderate right foraminal stenosis.  2. Borderline to mild multifactorial spinal stenosis at T11-T12  and L3-L4.  3. Mild to moderate multifactorial left L5 foraminal stenosis.   Original Report Authenticated By: Harley Hallmark, M.D. Clinical Data: Larey Seat. Hit head.    CT HEAD WITHOUT  CONTRAST 12/11 CT CERVICAL SPINE WITHOUT CONTRAST  Technique: Multidetector CT imaging of the head and cervical spine  was performed following the standard protocol without intravenous  contrast. Multiplanar CT image reconstructions of the cervical  spine were also generated.  Comparison: None  CT HEAD  Findings: There is mild age related cerebral atrophy and  ventriculomegaly. No extra-axial fluid collections are seen. No  CT findings for hemispheric infarction or intracranial hemorrhage.  No mass lesions. The brainstem and cerebellum are unremarkable.  The bony calvarium is intact. Benign appearing calvarial lesion  noted in the right frontal area. The paranasal sinuses and mastoid  air cells are clear. The globes are intact.  IMPRESSION:  1. Age related cerebral atrophy and ventriculomegaly.  2. No acute intracranial findings or mass lesions.  3. No skull fracture.  CT CERVICAL SPINE  Findings: Advanced degenerative cervical spondylosis with disc  disease and facet disease most notable at C4-5, C5-6 and C6-7. The  facets are normally aligned. No facet or laminar fractures. No  acute vertebral body fractures or abnormal prevertebral soft tissue  swelling. The C1-2 articulations are maintained. The dens appears  normal.  No large disc protrusions are seen. There is moderate osteophytic  spurring posteriorly at C5-6 and C6-7 with mild mass effect on the  thecal sac. Mild bilateral foraminal stenosis at C5-6 and C6-7.  IMPRESSION:  1. Degenerative cervical spondylosis with disc disease and facet  disease.  2. No acute bony findings and normal alignment.   Provider: Judie Petit   Lab Results  Component Value Date   WBC 4.9 09/06/2011   HGB 14.5 09/06/2011   HCT 45.2 09/06/2011   PLT 186 09/06/2011   GLUCOSE 107* 09/06/2011   CHOL 295* 04/02/2010   TRIG 152.0* 04/02/2010   HDL 50.90 04/02/2010   LDLDIRECT 217.8 04/02/2010   LDLCALC 107* 04/04/2009   ALT 16 09/06/2011   AST  17 09/06/2011   NA 137 09/06/2011   K 4.1 09/06/2011   CL 99 09/06/2011   CREATININE 0.90 09/06/2011   BUN 17 09/06/2011   CO2 32 09/06/2011   TSH 1.81 08/09/2011   INR 1.07 09/06/2011   HGBA1C 5.8 08/09/2011        Assessment & Plan:   Needs to loose weight

## 2011-09-13 NOTE — Assessment & Plan Note (Signed)
Continue with current prescription therapy as reflected on the Med list.  

## 2011-09-14 ENCOUNTER — Ambulatory Visit (HOSPITAL_COMMUNITY)
Admission: RE | Admit: 2011-09-14 | Discharge: 2011-09-15 | Disposition: A | Discharge status: Home / Self Care | Payer: Medicare Other | Source: Ambulatory Visit | Attending: General Surgery | Admitting: General Surgery

## 2011-09-14 ENCOUNTER — Encounter (HOSPITAL_COMMUNITY): Payer: Self-pay | Admitting: Certified Registered Nurse Anesthetist

## 2011-09-14 ENCOUNTER — Encounter (HOSPITAL_COMMUNITY)
Admission: RE | Disposition: A | Discharge status: Home / Self Care | Payer: Self-pay | Source: Ambulatory Visit | Attending: General Surgery

## 2011-09-14 ENCOUNTER — Ambulatory Visit (HOSPITAL_COMMUNITY): Payer: Medicare Other | Admitting: Certified Registered Nurse Anesthetist

## 2011-09-14 ENCOUNTER — Ambulatory Visit (HOSPITAL_COMMUNITY): Payer: Medicare Other

## 2011-09-14 ENCOUNTER — Encounter (HOSPITAL_COMMUNITY): Payer: Self-pay | Admitting: *Deleted

## 2011-09-14 DIAGNOSIS — K7689 Other specified diseases of liver: Secondary | ICD-10-CM

## 2011-09-14 DIAGNOSIS — K811 Chronic cholecystitis: Secondary | ICD-10-CM | POA: Insufficient documentation

## 2011-09-14 DIAGNOSIS — E785 Hyperlipidemia, unspecified: Secondary | ICD-10-CM | POA: Insufficient documentation

## 2011-09-14 DIAGNOSIS — J449 Chronic obstructive pulmonary disease, unspecified: Secondary | ICD-10-CM | POA: Insufficient documentation

## 2011-09-14 DIAGNOSIS — E669 Obesity, unspecified: Secondary | ICD-10-CM | POA: Insufficient documentation

## 2011-09-14 DIAGNOSIS — I10 Essential (primary) hypertension: Secondary | ICD-10-CM | POA: Insufficient documentation

## 2011-09-14 DIAGNOSIS — Z79899 Other long term (current) drug therapy: Secondary | ICD-10-CM | POA: Insufficient documentation

## 2011-09-14 DIAGNOSIS — J4489 Other specified chronic obstructive pulmonary disease: Secondary | ICD-10-CM | POA: Insufficient documentation

## 2011-09-14 DIAGNOSIS — K219 Gastro-esophageal reflux disease without esophagitis: Secondary | ICD-10-CM | POA: Insufficient documentation

## 2011-09-14 DIAGNOSIS — Z96659 Presence of unspecified artificial knee joint: Secondary | ICD-10-CM | POA: Insufficient documentation

## 2011-09-14 HISTORY — PX: LIVER BIOPSY: SHX301

## 2011-09-14 HISTORY — PX: CHOLECYSTECTOMY: SHX55

## 2011-09-14 SURGERY — LAPAROSCOPIC CHOLECYSTECTOMY
Anesthesia: General | Site: Abdomen | Wound class: Clean Contaminated

## 2011-09-14 MED ORDER — ALBUTEROL SULFATE HFA 108 (90 BASE) MCG/ACT IN AERS
INHALATION_SPRAY | RESPIRATORY_TRACT | Status: AC
  Filled 2011-09-14: qty 6.7

## 2011-09-14 MED ORDER — HYDROMORPHONE HCL PF 1 MG/ML IJ SOLN
INTRAMUSCULAR | Status: AC
  Administered 2011-09-14: 0.25 mg
  Administered 2011-09-14: 0.5 mg
  Administered 2011-09-14: 0.25 mg
  Administered 2011-09-14: 0.5 mg
  Filled 2011-09-14: qty 1

## 2011-09-14 MED ORDER — 0.9 % SODIUM CHLORIDE (POUR BTL) OPTIME
TOPICAL | Status: DC | PRN
  Administered 2011-09-14: 1000 mL

## 2011-09-14 MED ORDER — ALBUTEROL SULFATE HFA 108 (90 BASE) MCG/ACT IN AERS
INHALATION_SPRAY | RESPIRATORY_TRACT | Status: DC | PRN
  Administered 2011-09-14: 5 via RESPIRATORY_TRACT

## 2011-09-14 MED ORDER — FLUOXETINE HCL 10 MG PO CAPS
10.0000 mg | ORAL_CAPSULE | Freq: Every day | ORAL | Status: DC
  Administered 2011-09-14 – 2011-09-15 (×2): 10 mg via ORAL
  Filled 2011-09-14 (×2): qty 1

## 2011-09-14 MED ORDER — ONDANSETRON HCL 4 MG/2ML IJ SOLN
INTRAMUSCULAR | Status: DC | PRN
  Administered 2011-09-14: 4 mg via INTRAVENOUS

## 2011-09-14 MED ORDER — MIDAZOLAM HCL 5 MG/ML IJ SOLN
INTRAMUSCULAR | Status: AC
  Administered 2011-09-14: 0.5 mg
  Filled 2011-09-14: qty 1

## 2011-09-14 MED ORDER — LACTATED RINGERS IR SOLN
Status: DC | PRN
  Administered 2011-09-14: 1000 mL

## 2011-09-14 MED ORDER — MORPHINE SULFATE 2 MG/ML IJ SOLN: 2.0000 mg | INTRAMUSCULAR | Status: DC | PRN

## 2011-09-14 MED ORDER — DEXAMETHASONE SODIUM PHOSPHATE 10 MG/ML IJ SOLN
INTRAMUSCULAR | Status: DC | PRN
  Administered 2011-09-14: 10 mg via INTRAVENOUS

## 2011-09-14 MED ORDER — LOSARTAN POTASSIUM 50 MG PO TABS
100.0000 mg | ORAL_TABLET | Freq: Every day | ORAL | Status: DC
  Administered 2011-09-14 – 2011-09-15 (×2): 100 mg via ORAL
  Filled 2011-09-14 (×2): qty 2

## 2011-09-14 MED ORDER — FENTANYL CITRATE 0.05 MG/ML IJ SOLN
INTRAMUSCULAR | Status: DC | PRN
  Administered 2011-09-14 (×5): 50 ug via INTRAVENOUS

## 2011-09-14 MED ORDER — ALBUTEROL SULFATE (5 MG/ML) 0.5% IN NEBU
INHALATION_SOLUTION | RESPIRATORY_TRACT | Status: DC | PRN
  Administered 2011-09-14: 2.5 mg via RESPIRATORY_TRACT

## 2011-09-14 MED ORDER — BUPIVACAINE HCL (PF) 0.5 % IJ SOLN
INTRAMUSCULAR | Status: AC
  Filled 2011-09-14: qty 30

## 2011-09-14 MED ORDER — GLYCOPYRROLATE 0.2 MG/ML IJ SOLN
INTRAMUSCULAR | Status: DC | PRN
  Administered 2011-09-14: 0.1 mg via INTRAVENOUS
  Administered 2011-09-14: .7 mg via INTRAVENOUS

## 2011-09-14 MED ORDER — POTASSIUM CHLORIDE ER 8 MEQ PO TBCR: 8.0000 meq | EXTENDED_RELEASE_TABLET | Freq: Every morning | ORAL | Status: DC

## 2011-09-14 MED ORDER — CEFAZOLIN SODIUM 1-5 GM-% IV SOLN
INTRAVENOUS | Status: DC | PRN
  Administered 2011-09-14: 2 g via INTRAVENOUS

## 2011-09-14 MED ORDER — PROMETHAZINE HCL 25 MG/ML IJ SOLN: 6.2500 mg | INTRAMUSCULAR | Status: DC | PRN

## 2011-09-14 MED ORDER — ROCURONIUM BROMIDE 100 MG/10ML IV SOLN
INTRAVENOUS | Status: DC | PRN
  Administered 2011-09-14: 40 mg via INTRAVENOUS

## 2011-09-14 MED ORDER — ONDANSETRON HCL 4 MG/2ML IJ SOLN: 4.0000 mg | Freq: Four times a day (QID) | INTRAMUSCULAR | Status: DC | PRN

## 2011-09-14 MED ORDER — MOMETASONE FURO-FORMOTEROL FUM 200-5 MCG/ACT IN AERO
2.0000 | INHALATION_SPRAY | Freq: Every day | RESPIRATORY_TRACT | Status: DC
  Administered 2011-09-14 – 2011-09-15 (×2): 2 via RESPIRATORY_TRACT
  Filled 2011-09-14 (×9): qty 0.3

## 2011-09-14 MED ORDER — NEOSTIGMINE METHYLSULFATE 1 MG/ML IJ SOLN
INTRAMUSCULAR | Status: DC | PRN
  Administered 2011-09-14: 5 mg via INTRAVENOUS

## 2011-09-14 MED ORDER — LOSARTAN POTASSIUM-HCTZ 100-25 MG PO TABS: 1.0000 | ORAL_TABLET | Freq: Every morning | ORAL | Status: DC

## 2011-09-14 MED ORDER — PANTOPRAZOLE SODIUM 40 MG PO TBEC
40.0000 mg | DELAYED_RELEASE_TABLET | Freq: Every day | ORAL | Status: DC
  Administered 2011-09-14: 40 mg via ORAL
  Filled 2011-09-14 (×2): qty 1

## 2011-09-14 MED ORDER — FENTANYL CITRATE 0.05 MG/ML IJ SOLN: 25.0000 ug | INTRAMUSCULAR | Status: DC | PRN

## 2011-09-14 MED ORDER — ALBUTEROL SULFATE (5 MG/ML) 0.5% IN NEBU
INHALATION_SOLUTION | RESPIRATORY_TRACT | Status: AC
  Filled 2011-09-14: qty 0.5

## 2011-09-14 MED ORDER — SUCCINYLCHOLINE CHLORIDE 20 MG/ML IJ SOLN
INTRAMUSCULAR | Status: DC | PRN
  Administered 2011-09-14: 100 mg via INTRAVENOUS

## 2011-09-14 MED ORDER — LACTATED RINGERS IV SOLN: INTRAVENOUS | Status: DC

## 2011-09-14 MED ORDER — ONDANSETRON HCL 4 MG PO TABS: 4.0000 mg | ORAL_TABLET | Freq: Four times a day (QID) | ORAL | Status: DC | PRN

## 2011-09-14 MED ORDER — ACETAMINOPHEN 10 MG/ML IV SOLN
INTRAVENOUS | Status: DC | PRN
  Administered 2011-09-14: 1000 mg via INTRAVENOUS

## 2011-09-14 MED ORDER — HYDROCHLOROTHIAZIDE 25 MG PO TABS
25.0000 mg | ORAL_TABLET | Freq: Every day | ORAL | Status: DC
  Administered 2011-09-14 – 2011-09-15 (×2): 25 mg via ORAL
  Filled 2011-09-14 (×2): qty 1

## 2011-09-14 MED ORDER — FUROSEMIDE 20 MG PO TABS
20.0000 mg | ORAL_TABLET | Freq: Every day | ORAL | Status: DC | PRN
  Filled 2011-09-14: qty 1

## 2011-09-14 MED ORDER — CEFAZOLIN SODIUM-DEXTROSE 2-3 GM-% IV SOLR
INTRAVENOUS | Status: AC
  Filled 2011-09-14: qty 50

## 2011-09-14 MED ORDER — IOHEXOL 300 MG/ML  SOLN
INTRAMUSCULAR | Status: AC
  Filled 2011-09-14: qty 1

## 2011-09-14 MED ORDER — LACTATED RINGERS IV SOLN
INTRAVENOUS | Status: DC | PRN
  Administered 2011-09-14: 07:00:00 via INTRAVENOUS

## 2011-09-14 MED ORDER — ACETAMINOPHEN 10 MG/ML IV SOLN
INTRAVENOUS | Status: AC
  Filled 2011-09-14: qty 100

## 2011-09-14 MED ORDER — CEFAZOLIN SODIUM 1-5 GM-% IV SOLN
1.0000 g | Freq: Four times a day (QID) | INTRAVENOUS | Status: AC
  Administered 2011-09-14 – 2011-09-15 (×3): 1 g via INTRAVENOUS
  Filled 2011-09-14 (×3): qty 50

## 2011-09-14 MED ORDER — ALBUTEROL SULFATE (5 MG/ML) 0.5% IN NEBU
2.5000 mg | INHALATION_SOLUTION | Freq: Four times a day (QID) | RESPIRATORY_TRACT | Status: DC
  Administered 2011-09-14 – 2011-09-15 (×3): 2.5 mg via RESPIRATORY_TRACT
  Filled 2011-09-14 (×3): qty 0.5

## 2011-09-14 MED ORDER — EPHEDRINE SULFATE 50 MG/ML IJ SOLN
INTRAMUSCULAR | Status: DC | PRN
  Administered 2011-09-14: 10 mg via INTRAVENOUS

## 2011-09-14 MED ORDER — HYDROMORPHONE HCL PF 1 MG/ML IJ SOLN
INTRAMUSCULAR | Status: AC
  Filled 2011-09-14: qty 1

## 2011-09-14 MED ORDER — LORATADINE 10 MG PO TABS
10.0000 mg | ORAL_TABLET | Freq: Every morning | ORAL | Status: DC
  Administered 2011-09-15: 10 mg via ORAL
  Filled 2011-09-14: qty 1

## 2011-09-14 MED ORDER — IOHEXOL 300 MG/ML  SOLN
INTRAMUSCULAR | Status: DC | PRN
  Administered 2011-09-14: 2 mL

## 2011-09-14 MED ORDER — KCL IN DEXTROSE-NACL 20-5-0.9 MEQ/L-%-% IV SOLN
INTRAVENOUS | Status: AC
  Filled 2011-09-14: qty 1000

## 2011-09-14 MED ORDER — PHENYLEPHRINE HCL 10 MG/ML IJ SOLN
10.0000 mg | INTRAVENOUS | Status: DC | PRN
  Administered 2011-09-14: 50 ug/min via INTRAVENOUS

## 2011-09-14 MED ORDER — KCL IN DEXTROSE-NACL 20-5-0.9 MEQ/L-%-% IV SOLN
INTRAVENOUS | Status: DC
  Administered 2011-09-14 – 2011-09-15 (×2): via INTRAVENOUS
  Filled 2011-09-14 (×5): qty 1000

## 2011-09-14 MED ORDER — POTASSIUM CHLORIDE CRYS ER 10 MEQ PO TBCR
10.0000 meq | EXTENDED_RELEASE_TABLET | Freq: Every day | ORAL | Status: DC
  Administered 2011-09-14 – 2011-09-15 (×2): 10 meq via ORAL
  Filled 2011-09-14 (×2): qty 1

## 2011-09-14 MED ORDER — OXYCODONE-ACETAMINOPHEN 5-325 MG PO TABS
1.0000 | ORAL_TABLET | ORAL | Status: DC | PRN
  Administered 2011-09-14: 1 via ORAL
  Administered 2011-09-15: 2 via ORAL
  Filled 2011-09-14 (×2): qty 2
  Filled 2011-09-14: qty 1

## 2011-09-14 MED ORDER — ALPRAZOLAM 0.5 MG PO TABS
0.5000 mg | ORAL_TABLET | Freq: Two times a day (BID) | ORAL | Status: DC | PRN
Start: 2011-09-14 — End: 2011-09-15
  Administered 2011-09-15: 0.5 mg via ORAL
  Filled 2011-09-14: qty 1

## 2011-09-14 MED ORDER — PROPOFOL 10 MG/ML IV EMUL
INTRAVENOUS | Status: DC | PRN
  Administered 2011-09-14: 120 mg via INTRAVENOUS

## 2011-09-14 SURGICAL SUPPLY — 46 items
APL SKNCLS STERI-STRIP NONHPOA (GAUZE/BANDAGES/DRESSINGS) ×2
APPLIER CLIP 5 13 M/L LIGAMAX5 (MISCELLANEOUS) ×3
APPLIER CLIP ROT 10 11.4 M/L (STAPLE)
APR CLP MED LRG 11.4X10 (STAPLE)
APR CLP MED LRG 5 ANG JAW (MISCELLANEOUS) ×2
BAG SPEC RTRVL LRG 6X4 10 (ENDOMECHANICALS) ×2
BENZOIN TINCTURE PRP APPL 2/3 (GAUZE/BANDAGES/DRESSINGS) ×3 IMPLANT
CANISTER SUCTION 2500CC (MISCELLANEOUS) ×3 IMPLANT
CHLORAPREP W/TINT 26ML (MISCELLANEOUS) ×3 IMPLANT
CLIP APPLIE 5 13 M/L LIGAMAX5 (MISCELLANEOUS) ×2 IMPLANT
CLIP APPLIE ROT 10 11.4 M/L (STAPLE) IMPLANT
CLOTH BEACON ORANGE TIMEOUT ST (SAFETY) ×3 IMPLANT
COVER MAYO STAND STRL (DRAPES) ×2 IMPLANT
COVER SURGICAL LIGHT HANDLE (MISCELLANEOUS) IMPLANT
DECANTER SPIKE VIAL GLASS SM (MISCELLANEOUS) ×3 IMPLANT
DRAPE C-ARM 42X72 X-RAY (DRAPES) ×2 IMPLANT
DRAPE LAPAROSCOPIC ABDOMINAL (DRAPES) ×3 IMPLANT
DRAPE UTILITY XL STRL (DRAPES) ×3 IMPLANT
DRSG TEGADERM 2-3/8X2-3/4 SM (GAUZE/BANDAGES/DRESSINGS) ×12 IMPLANT
ELECT REM PT RETURN 9FT ADLT (ELECTROSURGICAL) ×3
ELECTRODE REM PT RTRN 9FT ADLT (ELECTROSURGICAL) ×2 IMPLANT
ENDOLOOP SUT PDS II  0 18 (SUTURE) ×1
ENDOLOOP SUT PDS II 0 18 (SUTURE) ×1 IMPLANT
GLOVE BIOGEL PI IND STRL 7.0 (GLOVE) ×2 IMPLANT
GLOVE BIOGEL PI INDICATOR 7.0 (GLOVE) ×1
GLOVE ECLIPSE 8.0 STRL XLNG CF (GLOVE) ×3 IMPLANT
GLOVE INDICATOR 8.0 STRL GRN (GLOVE) ×6 IMPLANT
GOWN STRL NON-REIN LRG LVL3 (GOWN DISPOSABLE) ×3 IMPLANT
GOWN STRL REIN XL XLG (GOWN DISPOSABLE) ×6 IMPLANT
HEMOSTAT SNOW SURGICEL 2X4 (HEMOSTASIS) ×2 IMPLANT
HEMOSTAT SURGICEL 4X8 (HEMOSTASIS) IMPLANT
IV CATH 14GX2 1/4 (CATHETERS) IMPLANT
KIT BASIN OR (CUSTOM PROCEDURE TRAY) ×3 IMPLANT
NS IRRIG 1000ML POUR BTL (IV SOLUTION) IMPLANT
POUCH SPECIMEN RETRIEVAL 10MM (ENDOMECHANICALS) ×2 IMPLANT
SET CHOLANGIOGRAPH MIX (MISCELLANEOUS) ×3 IMPLANT
SET IRRIG TUBING LAPAROSCOPIC (IRRIGATION / IRRIGATOR) ×3 IMPLANT
SOLUTION ANTI FOG 6CC (MISCELLANEOUS) ×3 IMPLANT
STRIP CLOSURE SKIN 1/2X4 (GAUZE/BANDAGES/DRESSINGS) ×3 IMPLANT
SUT MNCRL AB 4-0 PS2 18 (SUTURE) ×3 IMPLANT
TOWEL OR 17X26 10 PK STRL BLUE (TOWEL DISPOSABLE) ×3 IMPLANT
TRAY LAP CHOLE (CUSTOM PROCEDURE TRAY) ×3 IMPLANT
TROCAR BLADELESS OPT 5 75 (ENDOMECHANICALS) ×9 IMPLANT
TROCAR XCEL BLUNT TIP 100MML (ENDOMECHANICALS) ×3 IMPLANT
TROCAR XCEL NON-BLD 11X100MML (ENDOMECHANICALS) ×2 IMPLANT
TUBING INSUFFLATION 10FT LAP (TUBING) ×3 IMPLANT

## 2011-09-14 NOTE — Discharge Instructions (Signed)
[facility] LAPAROS[facility] SUR[facility]: POST OP INSTRUCTIONS Always review your discharge instruction sheet given to you by the facility where your surgery was performed. IF YOU HAVE DISABILITY OR FAMILY LEAVE FORMS, YOU MUST BRING THEM TO THE OFFICE FOR PROCESSING.   DO NOT GIVE THEM TO YOUR DOCTOR.  1. A prescription for pain medication may be given to you upon discharge.  Take your pain medication as prescribed, if needed.  If narcotic pain medicine is not needed, then you may take acetaminophen (Tylenol) or ibuprofen (Advil) as needed. 2. Take your usually prescribed medications unless otherwise directed. 3. If you need a refill on your pain medication, please contact your pharmacy.  They will contact our office to request authorization. Prescriptions will not be filled after 5pm or on week-ends. 4. You should follow a light diet the first few days after arrival home, such as soup and crackers, etc.  Be sure to include lots of fluids daily.  Lowfat diet afterwards. 5. Most patients will experience some swelling and bruising in the area of the incisions.  Ice packs will help.  Swelling and bruising can take several days to resolve.  6. It is common to experience some constipation if taking pain medication after surgery.  Increasing fluid intake and taking a stool softener (such as Colace) will usually help or prevent this problem from occurring.  A mild laxative (Milk of Magnesia or Miralax) should be taken according to package instructions if there are no bowel movements after 48 hours. 7. Unless discharge instructions indicate otherwise, you may remove your bandages 72 hours after surgery, and you may shower at that time.  You may have steri-strips (small skin tapes) in place directly over the incision.  These strips should be left on the skin.  If your surgeon used skin glue on the incision, you may shower in 24 hours.  The glue will flake off over the next 2-3 weeks.  Any  sutures or staples will be removed at the office during your follow-up visit. 8. ACTIVITIES:  You may resume regular (light) daily activities beginning the next day--such as daily self-care, walking, climbing stairs--gradually increasing activities as tolerated.  You may have sexual intercourse when it is comfortable.  Refrain from any heavy lifting or straining-nothing over 10 pounds for 2 weeks. a. You may drive when you are no longer taking prescription pain medication, you can comfortably wear a seatbelt, and you can safely maneuver your car and apply brakes. b. RETURN TO WORK:  __________________________________________________________ 9. You should see your doctor in the office for a follow-up appointment approximately 2-3 weeks after your surgery.  Make sure that you call for this appointment within a day or two after you arrive home to insure a convenient appointment time. 10. OTHER INSTRUCTIONS: __________________________________________________________________________________________________________________________ __________________________________________________________________________________________________________________________ WHEN TO CALL YOUR DOCTOR: 1. Fever over 101.5 2. Inability to urinate 3. Continued bleeding from incision. 4. Increased pain, redness, or drainage from the incision. 5. Increasing abdominal pain  The clinic staff is available to answer your questions during regular business hours.  Please don't hesitate to call and ask to speak to one of the nurses for clinical concerns.  If you have a medical emergency, go to the nearest emergency room or call 911.  A surgeon from [facility] is always on call at the hospital. [address], [place], [place]  [address] ? [address] [address], [place], [place]   [address] [phone] ? [phone] ? FAX [phone]  Web site: [email or web address]

## 2011-09-14 NOTE — Anesthesia Postprocedure Evaluation (Signed)
Anesthesia Post Note  Patient: Sabrina Page  Procedure(s) Performed: Procedure(s) (LRB): LIVER BIOPSY (N/A) LAPAROSCOPIC CHOLECYSTECTOMY (N/A)  Anesthesia type: General  Patient location: PACU  Post pain: Pain level controlled  Post assessment: Post-op Vital signs reviewed  Last Vitals:  Filed Vitals:   09/14/11 1819  BP: 102/67  Pulse: 92  Temp: 36.2 C  Resp: 20    Post vital signs: Reviewed  Level of consciousness: sedated  Complications: No apparent anesthesia complications

## 2011-09-14 NOTE — Transfer of Care (Signed)
Immediate Anesthesia Transfer of Care Note  Patient: Sabrina Page  Procedure(s) Performed: Procedure(s) (LRB): LIVER BIOPSY (N/A) LAPAROSCOPIC CHOLECYSTECTOMY (N/A)  Patient Location: PACU  Anesthesia Type: General  Level of Consciousness: sedated, patient cooperative and responds to stimulaton  Airway & Oxygen Therapy: Patient Spontanous Breathing and Patient connected to face mask oxgen  Post-op Assessment: Report given to PACU RN and Post -op Vital signs reviewed and stable  Post vital signs: Reviewed and stable  Complications: No apparent anesthesia complications

## 2011-09-14 NOTE — Interval H&P Note (Signed)
History and Physical Interval Note:  09/14/2011 7:31 AM  Sabrina Page  has presented today for surgery, with the diagnosis of Symptomatic cholelithiasis  The various methods of treatment have been discussed with the patient and family. After consideration of risks, benefits and other options for treatment, the patient has consented to  Procedure(s) (LRB): LAPAROSCOPIC CHOLECYSTECTOMY WITH INTRAOPERATIVE CHOLANGIOGRAM (N/A) LIVER BIOPSY (N/A) as a surgical intervention .  The patients' history has been reviewed, patient examined, no change in status, stable for surgery.  I have reviewed the patients' chart and labs.  Questions were answered to the patient's satisfaction.     [name] [facility]

## 2011-09-14 NOTE — Anesthesia Preprocedure Evaluation (Addendum)
Anesthesia Evaluation  Patient identified by MRN, date of birth, ID band Patient awake    Reviewed: Allergy & Precautions, H&P , NPO status , Patient's Chart, lab work & pertinent test results  Airway Mallampati: II TM Distance: >3 FB Neck ROM: Full    Dental  (+) Edentulous Upper, Partial Lower, Loose, Chipped and Dental Advisory Given,    Pulmonary shortness of breath and with exertion, asthma , sleep apnea , pneumonia , COPD breath sounds clear to auscultation  Pulmonary exam normal       Cardiovascular hypertension, Pt. on medications Rhythm:Regular Rate:Normal     Neuro/Psych PSYCHIATRIC DISORDERS Anxiety Depression  Neuromuscular disease negative neurological ROS  negative psych ROS   GI/Hepatic Neg liver ROS, GERD-  Medicated,  Endo/Other  negative endocrine ROSMorbid obesity  Renal/GU Renal InsufficiencyRenal disease  negative genitourinary   Musculoskeletal negative musculoskeletal ROS (+)   Abdominal (+) + obese,   Peds negative pediatric ROS (+)  Hematology negative hematology ROS (+)   Anesthesia Other Findings   Reproductive/Obstetrics negative OB ROS                          Anesthesia Physical Anesthesia Plan  ASA: III  Anesthesia Plan: General   Post-op Pain Management:    Induction: Intravenous  Airway Management Planned: Oral ETT  Additional Equipment:   Intra-op Plan:   Post-operative Plan: Extubation in OR  Informed Consent: I have reviewed the patients History and Physical, chart, labs and discussed the procedure including the risks, benefits and alternatives for the proposed anesthesia with the patient or authorized representative who has indicated his/her understanding and acceptance.   Dental advisory given  Plan Discussed with: CRNA  Anesthesia Plan Comments:         Anesthesia Quick Evaluation

## 2011-09-14 NOTE — H&P (Signed)
Sabrina Page is an 78 y.o. female.   Chief Complaint: RUQ pain and cholelithiasis HPI:   78 year old [place] female biliary colic after eating and known gallstones.  There is also a suspicion of fatty liver.  She present for elective lap chole and liver bx.  Past Medical History  Diagnosis Date  . Allergy     rhinitis  . Asthma   . Hyperlipidemia   . Hypertension   . Depression   . GERD (gastroesophageal reflux disease)   . Osteoarthritis   . Obesity   . Urinary incontinence   . Shoulder injury     left  . COPD (chronic obstructive pulmonary disease)   . Osteoporosis   . Gallstones   . Peripheral vascular disease     swelling   . Shortness of breath     with exertion   . Pneumonia   . Sleep apnea     never diagnosed   . Chronic kidney disease     hx of cystitis   . Dizziness     vertigo     Past Surgical History  Procedure Date  . Right left knee arthroplast   . L tkr   . Other surgical history     left leg surgery   . Other surgical history     left breast surgery due to mastitis     Family History  Problem Relation Age of Onset  . Hypertension Other   . Hypertension Mother   . Arthritis Mother   . Diabetes Mother   . Stroke Mother   . Stroke Father   . Hypertension Father    Social History:  reports that she quit smoking about 10 years ago. Her smoking use included Cigarettes. She has a 17.5 pack-year smoking history. She has never used smokeless tobacco. She reports that she drinks alcohol. She reports that she does not use illicit drugs.  Allergies:  Allergies  Allergen Reactions  . Atorvastatin     REACTION: upset stomach  . Enalapril Maleate   . Hctz (Hydrochlorothiazide)     Dizzy   . Lovastatin     REACTION: tongue stress  . Simvastatin     REACTION: mouth sores    Medications Prior to Admission  Medication Sig Dispense Refill  . ALPRAZolam (XANAX) 0.5 MG tablet Take 0.5 mg by mouth 2 (two) times daily as needed. For anxiety       . aspirin 81 MG tablet Take 81 mg by mouth every morning.       . B Complex-C (SUPER B COMPLEX PO) Take 1 tablet by mouth every morning.       . Cholecalciferol 1000 UNITS capsule Take 1,000 Units by mouth every morning.       . furosemide (LASIX) 20 MG tablet Take 1 tablet (20 mg total) by mouth daily as needed (swelling).  30 tablet  11  . loratadine (CLARITIN) 10 MG tablet Take 10 mg by mouth every morning.      Marland Kitchen losartan-hydrochlorothiazide (HYZAAR) 100-25 MG per tablet Take 1 tablet by mouth every morning.      . Mometasone Furo-Formoterol Fum 200-5 MCG/ACT AERO Inhale 2 puffs into the lungs 1 day or 1 dose.  1 Inhaler  11  . Omega-3 Fatty Acids (FISH OIL) 1000 MG CAPS Take 1 capsule by mouth every morning.       Marland Kitchen omeprazole (PRILOSEC) 20 MG capsule       . potassium chloride (KLOR-CON) 8 MEQ tablet  Take 8 mEq by mouth every morning.      Marland Kitchen FLUoxetine (PROZAC) 10 MG tablet Take 1 tablet (10 mg total) by mouth daily.  30 tablet  6  . naproxen (NAPROSYN) 500 MG tablet Take 500 mg by mouth 2 (two) times daily with a meal.        . triamcinolone (KENALOG) 0.1 % paste Place 1 application onto teeth 4 (four) times daily as needed. For pain        No results found for this or any previous visit (from the past 48 hour(s)). No results found.  Review of Systems  Constitutional: Negative for fever and chills.  Respiratory: Positive for wheezing.   Gastrointestinal: Negative for nausea, vomiting, abdominal pain and diarrhea.    Blood pressure 123/81, pulse 79, temperature 97.1 F (36.2 C), temperature source Oral, resp. rate 18, SpO2 98.00%. Physical Exam  Constitutional:       Obese female in NAD  HENT:  Head: Normocephalic and atraumatic.  Eyes: EOM are normal. No scleral icterus.  Cardiovascular: Normal rate and regular rhythm.   Respiratory: Effort normal and breath sounds normal.  GI: Soft. She exhibits no distension and no mass.  Musculoskeletal: She exhibits no edema.      Assessment/Plan Symptomatic cholelithiasis  Asthma-well controlled now.  Plan:  Lap Chole with IOC and liver biopsy.  Takeyla Million J 09/14/2011, 7:27 AM

## 2011-09-14 NOTE — Op Note (Signed)
Preoperative diagnosis:  Symptomatic cholelithiasis and fatty infiltration of the liver  Postoperative diagnosis:  Same  Procedure: Laparoscopic cholecystectomy, attempted IOC, wedge liver biopsy  Surgeon: Avel Peace, M.D.  Asst.:  Darnell Level, M.D.  Anesthesia: Gen.  Indication:   This is a 78 year old female with right upper quadrant pain after using a fatty meal. She underwent an evaluation for this.  An ultrasound demonstrated a 6 mm gallstone and findings consistent with fatty infiltration of the liver.  Common bile duct diameter was normal. Liver function tests are normal. She now presents for the above procedure.  Technique: The patient was brought to the operating room, placed supine on the operating table, and a general anesthetic was administered.  The abdominal wall was then sterilely prepped and draped. Local anesthetic (Marcaine) was infiltrated in the subumbilical region. A small subumbilical incision was made through the skin, subcutaneous tissue, fascia, and peritoneum entering the peritoneal cavity under direct vision. A pursestring suture of 0 Vicryl was placed around the edges of the fascia. A Hassan trocar was introduced into the peritoneal cavity and a pneumoperitoneum was created by insufflation of carbon dioxide gas. The laparoscope was introduced into the trocar and no underlying bleeding or organ injury was noted. The patient was then placed in the reverse Trendelenburg position with the right side tilted slightly up.  Three 5 mm trocars were then placed into the abdominal cavity under laparoscopic vision. One in the epigastric area, and 2 in the right upper quadrant area. The gallbladder was visualized and the fundus was grasped and retracted toward the right shoulder.  No acute inflammatory changes were noted. The liver was slightly discolored.  The infundibulum was mobilized with dissection close to the gallbladder and retracted laterally. The cystic duct was  identified and a window was created around it. The cystic artery was also identified and a window was created around it.  The critical view was achieved. A clip was placed at the neck of the gallbladder. A small incision was made in the cystic duct. The cystic duct was thin walled, friable, and long.  A cholangiocatheter was introduced through the anterior abdominal wall and placed in the cystic duct. A intraoperative cholangiogram was then initiated.  Under real-time fluoroscopy, dilute contrast was injected into the cystic duct. Contrast extravasated out of the cystic duct. Inspection of the catheter demonstrated it was only about 3 mm into the cystic duct but it gone slightly through the posterior wall where the contrast was extravasating. Given the fact that I had clear anatomy, a thin, friable cystic duct and normal liver function tests, I decided not to proceed with a cholangiogram. The cystic duct was clipped 4 times and the biliary side and then the catheter was removed. The cystic duct avulsed itself away from the gallbladder.  The cystic artery anterior and posterior branches were then clipped and divided. Following this the gallbladder was dissected free from the liver using electrocautery.   Using the scissors, I performed a wedge resection of the anterior medial lobe of the right liver and control the bleeding from this wedge biopsy site with electrocautery. The gallbladder and liver biopsy specimen were placed in the retrieval bag which was removed through the subumbilical port. The subumbilical trocar was replaced.  The gallbladder fossa was inspected, copiously irrigated, and bleeding was controlled with electrocautery. Inspection showed that hemostasis was adequate and there was no evidence of bile leak.  The irrigation fluid was evacuated as much as possible.  A piece of  Surgicel was placed in the gallbladder fossa.  The subumbilical trocar was removed and the fascial defect was closed by  tightening and tying down the pursestring suture under laparoscopic vision.  The remaining trocars were removed and the pneumoperitoneum was released. The skin incisions were closed with 4-0 Monocryl subcuticular stitches. Steri-Strips and sterile dressings were applied.  The procedure was well-tolerated without any apparent complications. The patient was taken to the recovery room in satisfactory condition.

## 2011-09-15 MED ORDER — OXYCODONE-ACETAMINOPHEN 5-325 MG PO TABS: 1.0000 | ORAL_TABLET | ORAL | Status: AC | PRN

## 2011-09-15 NOTE — Progress Notes (Signed)
1 Day Post-Op  Subjective: A little sore.  Tolerating liquid diet.  Objective: Vital signs in last 24 hours: Temp:  [96.9 F (36.1 C)-98.1 F (36.7 C)] 98.1 F (36.7 C) (04/24 0603) Pulse Rate:  [58-92] 67  (04/24 0603) Resp:  [12-20] 18  (04/24 0603) BP: (102-172)/(59-92) 134/59 mmHg (04/24 0603) SpO2:  [92 %-100 %] 97 % (04/24 0603) Weight:  [233 lb (105.688 kg)] 233 lb (105.688 kg) (04/23 1146) Last BM Date: 09/13/11  Intake/Output from previous day: 04/23 0701 - 04/24 0700 In: 1760 [P.O.:360; I.V.:1400] Out: 1600 [Urine:1600] Intake/Output this shift:    PE: Abd-soft, nontender, dressings dry  Lab Results:  No results found for this basename: WBC:2,HGB:2,HCT:2,PLT:2 in the last 72 hours BMET No results found for this basename: NA:2,K:2,CL:2,CO2:2,GLUCOSE:2,BUN:2,CREATININE:2,CALCIUM:2 in the last 72 hours PT/INR No results found for this basename: LABPROT:2,INR:2 in the last 72 hours Comprehensive Metabolic Panel:    Component Value Date/Time   NA 137 09/06/2011 0920   K 4.1 09/06/2011 0920   CL 99 09/06/2011 0920   CO2 32 09/06/2011 0920   BUN 17 09/06/2011 0920   CREATININE 0.90 09/06/2011 0920   GLUCOSE 107* 09/06/2011 0920   GLUCOSE 110* 04/20/2006 0829   CALCIUM 9.3 09/06/2011 0920   AST 17 09/06/2011 0920   ALT 16 09/06/2011 0920   ALKPHOS 75 09/06/2011 0920   BILITOT 0.5 09/06/2011 0920   PROT 7.2 09/06/2011 0920   ALBUMIN 3.7 09/06/2011 0920     Studies/Results: Dg C-arm 1-60 Min-no Report  09/14/2011  CLINICAL DATA: gallstones   C-ARM 1-60 MINUTES  Fluoroscopy was utilized by the requesting physician.  No radiographic  interpretation.      Anti-infectives: Anti-infectives     Start     Dose/Rate Route Frequency Ordered Stop   09/14/11 1400   ceFAZolin (ANCEF) IVPB 1 g/50 mL premix        1 g 100 mL/hr over 30 Minutes Intravenous Every 6 hours 09/14/11 1138 09/15/11 0239          Assessment Active Problems:  Doing well post lap chole    LOS: 1  day   Plan: Discharge.  Instructions given.   Maudine Kluesner Shela Commons 09/15/2011

## 2011-09-20 ENCOUNTER — Encounter (HOSPITAL_COMMUNITY): Payer: Self-pay | Admitting: General Surgery

## 2011-09-28 ENCOUNTER — Encounter (INDEPENDENT_AMBULATORY_CARE_PROVIDER_SITE_OTHER): Payer: Self-pay | Admitting: General Surgery

## 2011-09-28 ENCOUNTER — Ambulatory Visit (INDEPENDENT_AMBULATORY_CARE_PROVIDER_SITE_OTHER): Payer: Medicaid Other | Admitting: General Surgery

## 2011-09-28 VITALS — BP 104/64 | HR 77 | Temp 97.8°F | Ht 64.0 in | Wt 229.4 lb

## 2011-09-28 DIAGNOSIS — Z9889 Other specified postprocedural states: Secondary | ICD-10-CM

## 2011-09-28 NOTE — Patient Instructions (Signed)
Lowfat diet.  No fried food.  Activities as tolerated.

## 2011-09-28 NOTE — Progress Notes (Signed)
Operation: Laparoscopic cholecystectomy with cholangiogram, liver biopsy  Date: September 14, 2011  Pathology: Chronic cholecystitis, mild steatosis.  HPI: She is here for her first postoperative visit. She is doing well and has no current complaints. She is here with a [place] interpreter.  PE: Abdomen-incisions are clean, dry, and intact.  Assessment: She is doing well postoperatively. Pathology was explained to her.  Plan: Activities as tolerated.  Lifelong low fat diet, no fried foods. Return p.r.n.

## 2011-11-05 ENCOUNTER — Ambulatory Visit (INDEPENDENT_AMBULATORY_CARE_PROVIDER_SITE_OTHER): Payer: Medicare Other | Admitting: Internal Medicine

## 2011-11-05 ENCOUNTER — Encounter: Payer: Self-pay | Admitting: Internal Medicine

## 2011-11-05 VITALS — BP 110/60 | HR 88 | Temp 97.5°F | Resp 16 | Wt 225.0 lb

## 2011-11-05 DIAGNOSIS — M79604 Pain in right leg: Secondary | ICD-10-CM

## 2011-11-05 DIAGNOSIS — I10 Essential (primary) hypertension: Secondary | ICD-10-CM

## 2011-11-05 DIAGNOSIS — M545 Low back pain, unspecified: Secondary | ICD-10-CM

## 2011-11-05 DIAGNOSIS — R5383 Other fatigue: Secondary | ICD-10-CM

## 2011-11-05 DIAGNOSIS — F411 Generalized anxiety disorder: Secondary | ICD-10-CM

## 2011-11-05 DIAGNOSIS — J45909 Unspecified asthma, uncomplicated: Secondary | ICD-10-CM

## 2011-11-05 DIAGNOSIS — R0609 Other forms of dyspnea: Secondary | ICD-10-CM

## 2011-11-05 DIAGNOSIS — K089 Disorder of teeth and supporting structures, unspecified: Secondary | ICD-10-CM

## 2011-11-05 DIAGNOSIS — F329 Major depressive disorder, single episode, unspecified: Secondary | ICD-10-CM

## 2011-11-05 DIAGNOSIS — F3289 Other specified depressive episodes: Secondary | ICD-10-CM

## 2011-11-05 DIAGNOSIS — M79605 Pain in left leg: Secondary | ICD-10-CM

## 2011-11-05 DIAGNOSIS — R0989 Other specified symptoms and signs involving the circulatory and respiratory systems: Secondary | ICD-10-CM

## 2011-11-05 DIAGNOSIS — R5381 Other malaise: Secondary | ICD-10-CM

## 2011-11-05 NOTE — Assessment & Plan Note (Signed)
Chronic; psychosomatic issues

## 2011-11-05 NOTE — Assessment & Plan Note (Signed)
Chronic Worse 11/12 4/13 - she stopped meds a while ago 6/13 back on meds

## 2011-11-05 NOTE — Assessment & Plan Note (Signed)
Continue with current prescription therapy as reflected on the Med list.  

## 2011-11-05 NOTE — Progress Notes (Signed)
Subjective:    Patient ID: Sabrina Page, female    DOB: 08/04/1933, 78 y.o.   MRN: [id number]  HPI  F/u LE weakness leading to ataxia and falls (spinal stenosis), GS - she had surgery. Had  more falls. F/u asthma, dizziness - better. Teeth were removed  Past Medical History  Diagnosis Date  . Allergy     rhinitis  . Asthma   . Hyperlipidemia   . Hypertension   . Depression   . GERD (gastroesophageal reflux disease)   . Osteoarthritis   . Obesity   . Urinary incontinence   . Shoulder injury     left  . COPD (chronic obstructive pulmonary disease)   . Osteoporosis   . Gallstones   . Peripheral vascular disease     swelling   . Shortness of breath     with exertion   . Pneumonia   . Sleep apnea     never diagnosed   . Chronic kidney disease     hx of cystitis   . Dizziness     vertigo    Past Surgical History  Procedure Date  . Right left knee arthroplast   . L tkr   . Other surgical history     left leg surgery   . Other surgical history     left breast surgery due to mastitis   . Liver biopsy 09/14/2011    Procedure: LIVER BIOPSY;  Surgeon: Adolph Pollack, MD;  Location: [facility];  Service: General;  Laterality: N/A;  . Cholecystectomy 09/14/2011    Procedure: LAPAROSCOPIC CHOLECYSTECTOMY;  Surgeon: Adolph Pollack, MD;  Location: [facility];  Service: General;  Laterality: N/A;  attempted intraoperative cholangiogram     reports that she quit smoking about 10 years ago. Her smoking use included Cigarettes. She has a 17.5 pack-year smoking history. She has never used smokeless tobacco. She reports that she drinks alcohol. She reports that she does not use illicit drugs. family history includes Arthritis in her mother; Diabetes in her mother; Hypertension in her father, mother, and other; and Stroke in her father and mother. Allergies  Allergen Reactions  . Atorvastatin     REACTION: upset stomach  . Enalapril Maleate   . Hctz (Hydrochlorothiazide)    Dizzy   . Lovastatin     REACTION: tongue stress  . Simvastatin     REACTION: mouth sores   Current Outpatient Prescriptions on File Prior to Visit  Medication Sig Dispense Refill  . ALPRAZolam (XANAX) 0.5 MG tablet Take 0.5 mg by mouth 2 (two) times daily as needed. For anxiety      . aspirin 81 MG tablet Take 81 mg by mouth every morning.       . B Complex-C (SUPER B COMPLEX PO) Take 1 tablet by mouth every morning.       . Cholecalciferol 1000 UNITS capsule Take 1,000 Units by mouth every morning.       Marland Kitchen FLUoxetine (PROZAC) 10 MG tablet Take 1 tablet (10 mg total) by mouth daily.  30 tablet  6  . furosemide (LASIX) 20 MG tablet Take 1 tablet (20 mg total) by mouth daily as needed (swelling).  30 tablet  11  . loratadine (CLARITIN) 10 MG tablet Take 10 mg by mouth every morning.      . Mometasone Furo-Formoterol Fum 200-5 MCG/ACT AERO Inhale 2 puffs into the lungs 1 day or 1 dose.  1 Inhaler  11  . naproxen (NAPROSYN) 500 MG  tablet Take 500 mg by mouth 2 (two) times daily with a meal.        . Omega-3 Fatty Acids (FISH OIL) 1000 MG CAPS Take 1 capsule by mouth every morning.       Marland Kitchen omeprazole (PRILOSEC) 20 MG capsule       . potassium chloride (KLOR-CON) 8 MEQ tablet Take 8 mEq by mouth every morning.      . triamcinolone (KENALOG) 0.1 % paste Place 1 application onto teeth 4 (four) times daily as needed. For pain      . losartan-hydrochlorothiazide (HYZAAR) 100-25 MG per tablet Take 1 tablet by mouth every morning.       BP 110/60  Pulse 88  Temp 97.5 F (36.4 C) (Oral)  Resp 16  Wt 225 lb (102.059 kg)   Review of Systems  Constitutional: Negative for fever, chills, activity change, appetite change, fatigue and unexpected weight change.  HENT: Negative for congestion, mouth sores and sinus pressure.   Eyes: Negative for visual disturbance.  Respiratory: Negative for cough and chest tightness.   Gastrointestinal: Negative for nausea and abdominal pain.  Genitourinary:  Negative for frequency, difficulty urinating and vaginal pain.  Musculoskeletal: Positive for gait problem. Negative for myalgias and back pain.  Skin: Negative for pallor and rash.  Neurological: Negative for dizziness, tremors, weakness, numbness and headaches.  Psychiatric/Behavioral: Negative for confusion and disturbed wake/sleep cycle. The patient is nervous/anxious.    Wt Readings from Last 3 Encounters:  11/05/11 225 lb (102.059 kg)  09/28/11 229 lb 6.4 oz (104.055 kg)  09/14/11 233 lb (105.688 kg)   BP Readings from Last 3 Encounters:  11/05/11 110/60  09/28/11 104/64  09/15/11 134/59       Objective:   Physical Exam  Constitutional: She appears well-developed. No distress.  HENT:  Head: Normocephalic.  Right Ear: External ear normal.  Left Ear: External ear normal.  Nose: Nose normal.  Mouth/Throat: Oropharynx is clear and moist.  Eyes: Conjunctivae are normal. Pupils are equal, round, and reactive to light. Right eye exhibits no discharge. Left eye exhibits no discharge.  Neck: Normal range of motion. Neck supple. No JVD present. No tracheal deviation present. No thyromegaly present.  Cardiovascular: Normal rate, regular rhythm and normal heart sounds.   Pulmonary/Chest: No stridor. No respiratory distress. She has no wheezes.  Abdominal: Soft. Bowel sounds are normal. She exhibits no distension and no mass. There is no tenderness. There is no rebound and no guarding.  Musculoskeletal: She exhibits no edema and no tenderness.  Lymphadenopathy:    She has no cervical adenopathy.  Neurological: She displays abnormal reflex. No cranial nerve deficit. She exhibits normal muscle tone. Coordination abnormal.  Skin: No rash noted. No erythema.  Psychiatric: She has a normal mood and affect. Her behavior is normal. Judgment and thought content normal.   Lab Results  Component Value Date   WBC 4.9 09/06/2011   HGB 14.5 09/06/2011   HCT 45.2 09/06/2011   PLT 186 09/06/2011    GLUCOSE 107* 09/06/2011   CHOL 295* 04/02/2010   TRIG 152.0* 04/02/2010   HDL 50.90 04/02/2010   LDLDIRECT 217.8 04/02/2010   LDLCALC 107* 04/04/2009   ALT 16 09/06/2011   AST 17 09/06/2011   NA 137 09/06/2011   K 4.1 09/06/2011   CL 99 09/06/2011   CREATININE 0.90 09/06/2011   BUN 17 09/06/2011   CO2 32 09/06/2011   TSH 1.81 08/09/2011   INR 1.07 09/06/2011   HGBA1C 5.8 08/09/2011  MRI 1/13: IMPRESSION:  1. Severe multifactorial spinal and lateral recess stenosis at L4-  L5 plus moderate right foraminal stenosis.  2. Borderline to mild multifactorial spinal stenosis at T11-T12  and L3-L4.  3. Mild to moderate multifactorial left L5 foraminal stenosis.   Original Report Authenticated By: Harley Hallmark, M.D. Clinical Data: Larey Seat. Hit head.    CT HEAD WITHOUT CONTRAST 12/11 CT CERVICAL SPINE WITHOUT CONTRAST  Technique: Multidetector CT imaging of the head and cervical spine  was performed following the standard protocol without intravenous  contrast. Multiplanar CT image reconstructions of the cervical  spine were also generated.  Comparison: None  CT HEAD  Findings: There is mild age related cerebral atrophy and  ventriculomegaly. No extra-axial fluid collections are seen. No  CT findings for hemispheric infarction or intracranial hemorrhage.  No mass lesions. The brainstem and cerebellum are unremarkable.  The bony calvarium is intact. Benign appearing calvarial lesion  noted in the right frontal area. The paranasal sinuses and mastoid  air cells are clear. The globes are intact.  IMPRESSION:  1. Age related cerebral atrophy and ventriculomegaly.  2. No acute intracranial findings or mass lesions.  3. No skull fracture.  CT CERVICAL SPINE  Findings: Advanced degenerative cervical spondylosis with disc  disease and facet disease most notable at C4-5, C5-6 and C6-7. The  facets are normally aligned. No facet or laminar fractures. No  acute vertebral body fractures or  abnormal prevertebral soft tissue  swelling. The C1-2 articulations are maintained. The dens appears  normal.  No large disc protrusions are seen. There is moderate osteophytic  spurring posteriorly at C5-6 and C6-7 with mild mass effect on the  thecal sac. Mild bilateral foraminal stenosis at C5-6 and C6-7.  IMPRESSION:  1. Degenerative cervical spondylosis with disc disease and facet  disease.  2. No acute bony findings and normal alignment.   Provider: Judie Petit   Lab Results  Component Value Date   WBC 4.9 09/06/2011   HGB 14.5 09/06/2011   HCT 45.2 09/06/2011   PLT 186 09/06/2011   GLUCOSE 107* 09/06/2011   CHOL 295* 04/02/2010   TRIG 152.0* 04/02/2010   HDL 50.90 04/02/2010   LDLDIRECT 217.8 04/02/2010   LDLCALC 107* 04/04/2009   ALT 16 09/06/2011   AST 17 09/06/2011   NA 137 09/06/2011   K 4.1 09/06/2011   CL 99 09/06/2011   CREATININE 0.90 09/06/2011   BUN 17 09/06/2011   CO2 32 09/06/2011   TSH 1.81 08/09/2011   INR 1.07 09/06/2011   HGBA1C 5.8 08/09/2011        Assessment & Plan:   Needs to loose weight

## 2011-11-05 NOTE — Assessment & Plan Note (Signed)
Stable Continue with current prescription therapy as reflected on the Med list.  

## 2011-11-05 NOTE — Assessment & Plan Note (Signed)
Chronic. 

## 2011-11-05 NOTE — Assessment & Plan Note (Signed)
Better  

## 2011-11-05 NOTE — Assessment & Plan Note (Signed)
6/13 off meds - BP is low

## 2011-11-05 NOTE — Assessment & Plan Note (Signed)
Continue with current prescription therapy as reflected on the Med list. better 

## 2012-01-24 ENCOUNTER — Other Ambulatory Visit: Payer: Self-pay | Admitting: Internal Medicine

## 2012-02-10 ENCOUNTER — Ambulatory Visit (INDEPENDENT_AMBULATORY_CARE_PROVIDER_SITE_OTHER): Payer: Medicare Other | Admitting: Internal Medicine

## 2012-02-10 ENCOUNTER — Encounter: Payer: Self-pay | Admitting: Internal Medicine

## 2012-02-10 ENCOUNTER — Other Ambulatory Visit (INDEPENDENT_AMBULATORY_CARE_PROVIDER_SITE_OTHER): Payer: Medicare Other

## 2012-02-10 VITALS — BP 108/70 | HR 68 | Temp 98.4°F | Resp 16 | Wt 221.0 lb

## 2012-02-10 DIAGNOSIS — R7309 Other abnormal glucose: Secondary | ICD-10-CM

## 2012-02-10 DIAGNOSIS — F3289 Other specified depressive episodes: Secondary | ICD-10-CM

## 2012-02-10 DIAGNOSIS — R739 Hyperglycemia, unspecified: Secondary | ICD-10-CM

## 2012-02-10 DIAGNOSIS — K219 Gastro-esophageal reflux disease without esophagitis: Secondary | ICD-10-CM

## 2012-02-10 DIAGNOSIS — G4700 Insomnia, unspecified: Secondary | ICD-10-CM

## 2012-02-10 DIAGNOSIS — R209 Unspecified disturbances of skin sensation: Secondary | ICD-10-CM

## 2012-02-10 DIAGNOSIS — R202 Paresthesia of skin: Secondary | ICD-10-CM

## 2012-02-10 DIAGNOSIS — J45901 Unspecified asthma with (acute) exacerbation: Secondary | ICD-10-CM

## 2012-02-10 DIAGNOSIS — G4733 Obstructive sleep apnea (adult) (pediatric): Secondary | ICD-10-CM

## 2012-02-10 DIAGNOSIS — F329 Major depressive disorder, single episode, unspecified: Secondary | ICD-10-CM

## 2012-02-10 DIAGNOSIS — Z79899 Other long term (current) drug therapy: Secondary | ICD-10-CM

## 2012-02-10 DIAGNOSIS — Z23 Encounter for immunization: Secondary | ICD-10-CM

## 2012-02-10 DIAGNOSIS — J45909 Unspecified asthma, uncomplicated: Secondary | ICD-10-CM

## 2012-02-10 LAB — BASIC METABOLIC PANEL
BUN: 14 mg/dL (ref 6–23)
CO2: 29 mEq/L (ref 19–32)
Calcium: 8.9 mg/dL (ref 8.4–10.5)
Chloride: 103 mEq/L (ref 96–112)
Creatinine, Ser: 0.8 mg/dL (ref 0.4–1.2)
GFR: 70 mL/min (ref >60.00)
Glucose, Bld: 94 mg/dL (ref 70–99)
Potassium: 4.6 mEq/L (ref 3.5–5.1)
Sodium: 138 mEq/L (ref 135–145)

## 2012-02-10 LAB — HEPATIC FUNCTION PANEL
ALT: 18 U/L (ref 0–35)
AST: 18 U/L (ref 0–37)
Albumin: 3.8 g/dL (ref 3.5–5.2)
Alkaline Phosphatase: 60 U/L (ref 39–117)
Bilirubin, Direct: 0.1 mg/dL (ref 0.0–0.3)
Total Bilirubin: 0.8 mg/dL (ref 0.3–1.2)
Total Protein: 6.9 g/dL (ref 6.0–8.3)

## 2012-02-10 LAB — VITAMIN B12: Vitamin B-12: 567 pg/mL (ref 211–911)

## 2012-02-10 LAB — HEMOGLOBIN A1C: Hgb A1c MFr Bld: 5.5 % (ref 4.6–6.5)

## 2012-02-10 MED ORDER — NAPROXEN 500 MG PO TABS
500.0000 mg | ORAL_TABLET | Freq: Two times a day (BID) | ORAL | Status: DC | PRN
Start: 2012-02-10 — End: 2012-09-25

## 2012-02-10 MED ORDER — OMEPRAZOLE 20 MG PO CPDR: 40.0000 mg | DELAYED_RELEASE_CAPSULE | Freq: Every day | ORAL | Status: DC

## 2012-02-10 MED ORDER — GABAPENTIN 100 MG PO CAPS: 100.0000 mg | ORAL_CAPSULE | Freq: Three times a day (TID) | ORAL | Status: DC

## 2012-02-10 NOTE — Assessment & Plan Note (Signed)
Cont w/wt loss 

## 2012-02-10 NOTE — Assessment & Plan Note (Signed)
OSA.  She had a sleep test in [place] - (+) - declined CPAP

## 2012-02-10 NOTE — Assessment & Plan Note (Signed)
Better Continue with current prescription therapy as reflected on the Med list.  

## 2012-02-10 NOTE — Assessment & Plan Note (Signed)
Continue with current prescription therapy as reflected on the Med list.  

## 2012-02-10 NOTE — Progress Notes (Signed)
Subjective:    Patient ID: Sabrina Page, female    DOB: [birth year], 78 y.o.   MRN: [id number]  HPI  C/o diziness. C/o feet burning at times. F/u weakness leading to ataxia and falls (spinal stenosis). Had no more falls. F/u asthma, dizziness - better. Teeth were removed. She stopped using Dulera 6 wks ago. She stopped BP meds.   Past Medical History  Diagnosis Date  . Allergy     rhinitis  . Asthma   . Hyperlipidemia   . Hypertension   . Depression   . GERD (gastroesophageal reflux disease)   . Osteoarthritis   . Obesity   . Urinary incontinence   . Shoulder injury     left  . COPD (chronic obstructive pulmonary disease)   . Osteoporosis   . Gallstones   . Peripheral vascular disease     swelling   . Shortness of breath     with exertion   . Pneumonia   . Sleep apnea     never diagnosed   . Chronic kidney disease     hx of cystitis   . Dizziness     vertigo    Past Surgical History  Procedure Date  . Right left knee arthroplast   . L tkr   . Other surgical history     left leg surgery   . Other surgical history     left breast surgery due to mastitis   . Liver biopsy 09/14/2011    Procedure: LIVER BIOPSY;  Surgeon: Adolph Pollack, MD;  Location: [facility];  Service: General;  Laterality: N/A;  . Cholecystectomy 09/14/2011    Procedure: LAPAROSCOPIC CHOLECYSTECTOMY;  Surgeon: Adolph Pollack, MD;  Location: [facility];  Service: General;  Laterality: N/A;  attempted intraoperative cholangiogram     reports that she quit smoking about 10 years ago. Her smoking use included Cigarettes. She has a 17.5 pack-year smoking history. She has never used smokeless tobacco. She reports that she drinks alcohol. She reports that she does not use illicit drugs. family history includes Arthritis in her mother; Diabetes in her mother; Hypertension in her father, mother, and other; and Stroke in her father and mother. Allergies  Allergen Reactions  . Atorvastatin    REACTION: upset stomach  . Enalapril Maleate   . Hctz (Hydrochlorothiazide)     Dizzy   . Lovastatin     REACTION: tongue stress  . Simvastatin     REACTION: mouth sores   Current Outpatient Prescriptions on File Prior to Visit  Medication Sig Dispense Refill  . ALPRAZolam (XANAX) 0.5 MG tablet Take 0.5 mg by mouth 2 (two) times daily as needed. For anxiety      . aspirin 81 MG tablet Take 81 mg by mouth every morning.       . B Complex-C (SUPER B COMPLEX PO) Take 1 tablet by mouth every morning.       . Cholecalciferol 1000 UNITS capsule Take 1,000 Units by mouth every morning.       . furosemide (LASIX) 20 MG tablet Take 1 tablet (20 mg total) by mouth daily as needed (swelling).  30 tablet  11  . loratadine (CLARITIN) 10 MG tablet Take 10 mg by mouth every morning.      Marland Kitchen losartan-hydrochlorothiazide (HYZAAR) 100-25 MG per tablet Take 1 tablet by mouth every morning.      . Mometasone Furo-Formoterol Fum 200-5 MCG/ACT AERO Inhale 2 puffs into the lungs 1 day or 1 dose.  1 Inhaler  11  . naproxen (NAPROSYN) 500 MG tablet Take 500 mg by mouth 2 (two) times daily with a meal.        . Omega-3 Fatty Acids (FISH OIL) 1000 MG CAPS Take 1 capsule by mouth every morning.       Marland Kitchen omeprazole (PRILOSEC) 20 MG capsule       . omeprazole (PRILOSEC) 20 MG capsule TAKE 1 CAPSULE BY MOUTH EVERY DAY  30 capsule  5  . potassium chloride (KLOR-CON) 8 MEQ tablet Take 8 mEq by mouth every morning.      . triamcinolone (KENALOG) 0.1 % paste Place 1 application onto teeth 4 (four) times daily as needed. For pain      . DISCONTD: FLUoxetine (PROZAC) 10 MG tablet Take 1 tablet (10 mg total) by mouth daily.  30 tablet  6   BP 108/70  Pulse 68  Temp 98.4 F (36.9 C) (Oral)  Resp 16  Wt 221 lb (100.245 kg)   Review of Systems  Constitutional: Negative for fever, chills, activity change, appetite change, fatigue and unexpected weight change.  HENT: Negative for congestion, mouth sores and sinus pressure.    Eyes: Negative for visual disturbance.  Respiratory: Negative for cough and chest tightness.   Gastrointestinal: Negative for nausea and abdominal pain.  Genitourinary: Negative for frequency, difficulty urinating and vaginal pain.  Musculoskeletal: Positive for gait problem. Negative for myalgias and back pain.  Skin: Negative for pallor and rash.  Neurological: Negative for dizziness, tremors, weakness, numbness and headaches.  Psychiatric/Behavioral: Negative for confusion and disturbed wake/sleep cycle. The patient is nervous/anxious.    Wt Readings from Last 3 Encounters:  02/10/12 221 lb (100.245 kg)  11/05/11 225 lb (102.059 kg)  09/28/11 229 lb 6.4 oz (104.055 kg)   BP Readings from Last 3 Encounters:  02/10/12 108/70  11/05/11 110/60  09/28/11 104/64       Objective:   Physical Exam  Constitutional: She appears well-developed. No distress.  HENT:  Head: Normocephalic.  Right Ear: External ear normal.  Left Ear: External ear normal.  Nose: Nose normal.  Mouth/Throat: Oropharynx is clear and moist.  Eyes: Conjunctivae normal are normal. Pupils are equal, round, and reactive to light. Right eye exhibits no discharge. Left eye exhibits no discharge.  Neck: Normal range of motion. Neck supple. No JVD present. No tracheal deviation present. No thyromegaly present.  Cardiovascular: Normal rate, regular rhythm and normal heart sounds.   Pulmonary/Chest: No stridor. No respiratory distress. She has no wheezes.  Abdominal: Soft. Bowel sounds are normal. She exhibits no distension and no mass. There is no tenderness. There is no rebound and no guarding.  Musculoskeletal: She exhibits no edema and no tenderness.  Lymphadenopathy:    She has no cervical adenopathy.  Neurological: She displays abnormal reflex. No cranial nerve deficit. She exhibits normal muscle tone. Coordination abnormal.  Skin: No rash noted. No erythema.  Psychiatric: She has a normal mood and affect. Her  behavior is normal. Judgment and thought content normal.   Lab Results  Component Value Date   WBC 4.9 09/06/2011   HGB 14.5 09/06/2011   HCT 45.2 09/06/2011   PLT 186 09/06/2011   GLUCOSE 107* 09/06/2011   CHOL 295* 04/02/2010   TRIG 152.0* 04/02/2010   HDL 50.90 04/02/2010   LDLDIRECT 217.8 04/02/2010   LDLCALC 107* 04/04/2009   ALT 16 09/06/2011   AST 17 09/06/2011   NA 137 09/06/2011   K 4.1 09/06/2011  CL 99 09/06/2011   CREATININE 0.90 09/06/2011   BUN 17 09/06/2011   CO2 32 09/06/2011   TSH 1.81 08/09/2011   INR 1.07 09/06/2011   HGBA1C 5.8 08/09/2011      MRI 1/13: IMPRESSION:  1. Severe multifactorial spinal and lateral recess stenosis at L4-  L5 plus moderate right foraminal stenosis.  2. Borderline to mild multifactorial spinal stenosis at T11-T12  and L3-L4.  3. Mild to moderate multifactorial left L5 foraminal stenosis.   Original Report Authenticated By: Harley Hallmark, M.D. Clinical Data: Larey Seat. Hit head.    CT HEAD WITHOUT CONTRAST 12/11 CT CERVICAL SPINE WITHOUT CONTRAST  Technique: Multidetector CT imaging of the head and cervical spine  was performed following the standard protocol without intravenous  contrast. Multiplanar CT image reconstructions of the cervical  spine were also generated.  Comparison: None  CT HEAD  Findings: There is mild age related cerebral atrophy and  ventriculomegaly. No extra-axial fluid collections are seen. No  CT findings for hemispheric infarction or intracranial hemorrhage.  No mass lesions. The brainstem and cerebellum are unremarkable.  The bony calvarium is intact. Benign appearing calvarial lesion  noted in the right frontal area. The paranasal sinuses and mastoid  air cells are clear. The globes are intact.  IMPRESSION:  1. Age related cerebral atrophy and ventriculomegaly.  2. No acute intracranial findings or mass lesions.  3. No skull fracture.  CT CERVICAL SPINE  Findings: Advanced degenerative cervical  spondylosis with disc  disease and facet disease most notable at C4-5, C5-6 and C6-7. The  facets are normally aligned. No facet or laminar fractures. No  acute vertebral body fractures or abnormal prevertebral soft tissue  swelling. The C1-2 articulations are maintained. The dens appears  normal.  No large disc protrusions are seen. There is moderate osteophytic  spurring posteriorly at C5-6 and C6-7 with mild mass effect on the  thecal sac. Mild bilateral foraminal stenosis at C5-6 and C6-7.  IMPRESSION:  1. Degenerative cervical spondylosis with disc disease and facet  disease.  2. No acute bony findings and normal alignment.   Provider: Judie Petit   Lab Results  Component Value Date   WBC 4.9 09/06/2011   HGB 14.5 09/06/2011   HCT 45.2 09/06/2011   PLT 186 09/06/2011   GLUCOSE 107* 09/06/2011   CHOL 295* 04/02/2010   TRIG 152.0* 04/02/2010   HDL 50.90 04/02/2010   LDLDIRECT 217.8 04/02/2010   LDLCALC 107* 04/04/2009   ALT 16 09/06/2011   AST 17 09/06/2011   NA 137 09/06/2011   K 4.1 09/06/2011   CL 99 09/06/2011   CREATININE 0.90 09/06/2011   BUN 17 09/06/2011   CO2 32 09/06/2011   TSH 1.81 08/09/2011   INR 1.07 09/06/2011   HGBA1C 5.8 08/09/2011        Assessment & Plan:

## 2012-02-10 NOTE — Assessment & Plan Note (Signed)
OSA.  She had a sleep test in [place] - (+) - declined CPAP 

## 2012-03-06 ENCOUNTER — Other Ambulatory Visit: Payer: Self-pay | Admitting: Internal Medicine

## 2012-03-06 DIAGNOSIS — Z1231 Encounter for screening mammogram for malignant neoplasm of breast: Secondary | ICD-10-CM

## 2012-03-16 ENCOUNTER — Other Ambulatory Visit: Payer: Self-pay | Admitting: *Deleted

## 2012-03-16 MED ORDER — FUROSEMIDE 20 MG PO TABS: 20.0000 mg | ORAL_TABLET | Freq: Every day | ORAL | Status: DC | PRN

## 2012-04-03 ENCOUNTER — Ambulatory Visit
Admission: RE | Admit: 2012-04-03 | Discharge: 2012-04-03 | Disposition: A | Discharge status: Home / Self Care | Payer: Medicare Other | Source: Ambulatory Visit | Attending: Internal Medicine | Admitting: Internal Medicine

## 2012-04-03 DIAGNOSIS — Z1231 Encounter for screening mammogram for malignant neoplasm of breast: Secondary | ICD-10-CM

## 2012-05-11 ENCOUNTER — Ambulatory Visit (INDEPENDENT_AMBULATORY_CARE_PROVIDER_SITE_OTHER): Payer: Medicare Other | Admitting: Internal Medicine

## 2012-05-11 ENCOUNTER — Encounter: Payer: Self-pay | Admitting: Internal Medicine

## 2012-05-11 VITALS — BP 106/64 | HR 77 | Temp 98.8°F | Resp 16 | Wt 226.0 lb

## 2012-05-11 DIAGNOSIS — I10 Essential (primary) hypertension: Secondary | ICD-10-CM

## 2012-05-11 DIAGNOSIS — F329 Major depressive disorder, single episode, unspecified: Secondary | ICD-10-CM

## 2012-05-11 DIAGNOSIS — L570 Actinic keratosis: Secondary | ICD-10-CM | POA: Diagnosis not present

## 2012-05-11 DIAGNOSIS — J45909 Unspecified asthma, uncomplicated: Secondary | ICD-10-CM

## 2012-05-11 DIAGNOSIS — F3289 Other specified depressive episodes: Secondary | ICD-10-CM

## 2012-05-11 DIAGNOSIS — I872 Venous insufficiency (chronic) (peripheral): Secondary | ICD-10-CM

## 2012-05-11 DIAGNOSIS — M545 Low back pain, unspecified: Secondary | ICD-10-CM

## 2012-05-11 DIAGNOSIS — K219 Gastro-esophageal reflux disease without esophagitis: Secondary | ICD-10-CM | POA: Diagnosis not present

## 2012-05-11 DIAGNOSIS — R739 Hyperglycemia, unspecified: Secondary | ICD-10-CM

## 2012-05-11 DIAGNOSIS — R7309 Other abnormal glucose: Secondary | ICD-10-CM

## 2012-05-11 DIAGNOSIS — M79604 Pain in right leg: Secondary | ICD-10-CM

## 2012-05-11 DIAGNOSIS — M79605 Pain in left leg: Secondary | ICD-10-CM

## 2012-05-11 MED ORDER — FLUOXETINE HCL 10 MG PO TABS: 20.0000 mg | ORAL_TABLET | Freq: Every day | ORAL | Status: DC

## 2012-05-11 NOTE — Assessment & Plan Note (Signed)
Continue with current prescription therapy as reflected on the Med list.  

## 2012-05-11 NOTE — Assessment & Plan Note (Signed)
Cryo  

## 2012-05-11 NOTE — Progress Notes (Signed)
Subjective:    Patient ID: Sabrina Page, female    DOB: [birth year], 78 y.o.   MRN: [id number]  HPI  C/o diziness. C/o feet burning at times. F/u weakness leading to ataxia and falls (spinal stenosis). Had no more falls. F/u asthma, dizziness - better. Teeth were removed. She stopped using Dulera 6 wks ago. She stopped BP meds.   Past Medical History  Diagnosis Date  . Allergy     rhinitis  . Asthma   . Hyperlipidemia   . Hypertension   . Depression   . GERD (gastroesophageal reflux disease)   . Osteoarthritis   . Obesity   . Urinary incontinence   . Shoulder injury     left  . COPD (chronic obstructive pulmonary disease)   . Osteoporosis   . Gallstones   . Peripheral vascular disease     swelling   . Shortness of breath     with exertion   . Pneumonia   . Sleep apnea     never diagnosed   . Chronic kidney disease     hx of cystitis   . Dizziness     vertigo    Past Surgical History  Procedure Date  . Right left knee arthroplast   . L tkr   . Other surgical history     left leg surgery   . Other surgical history     left breast surgery due to mastitis   . Liver biopsy 09/14/2011    Procedure: LIVER BIOPSY;  Surgeon: Adolph Pollack, MD;  Location: [facility];  Service: General;  Laterality: N/A;  . Cholecystectomy 09/14/2011    Procedure: LAPAROSCOPIC CHOLECYSTECTOMY;  Surgeon: Adolph Pollack, MD;  Location: [facility];  Service: General;  Laterality: N/A;  attempted intraoperative cholangiogram     reports that she quit smoking about 10 years ago. Her smoking use included Cigarettes. She has a 17.5 pack-year smoking history. She has never used smokeless tobacco. She reports that she drinks alcohol. She reports that she does not use illicit drugs. family history includes Arthritis in her mother; Diabetes in her mother; Hypertension in her father, mother, and other; and Stroke in her father and mother. Allergies  Allergen Reactions  . Atorvastatin    REACTION: upset stomach  . Enalapril Maleate   . Hctz (Hydrochlorothiazide)     Dizzy   . Lovastatin     REACTION: tongue stress  . Simvastatin     REACTION: mouth sores   Current Outpatient Prescriptions on File Prior to Visit  Medication Sig Dispense Refill  . ALPRAZolam (XANAX) 0.5 MG tablet Take 0.5 mg by mouth 2 (two) times daily as needed. For anxiety      . aspirin 81 MG tablet Take 81 mg by mouth every morning.       . B Complex-C (SUPER B COMPLEX PO) Take 1 tablet by mouth every morning.       . Cholecalciferol 1000 UNITS capsule Take 1,000 Units by mouth every morning.       Marland Kitchen FLUoxetine (PROZAC) 10 MG tablet Take 10 mg by mouth daily.      . furosemide (LASIX) 20 MG tablet Take 1 tablet (20 mg total) by mouth daily as needed (swelling).  30 tablet  5  . gabapentin (NEURONTIN) 100 MG capsule Take 1 capsule (100 mg total) by mouth 3 (three) times daily.  90 capsule  3  . loratadine (CLARITIN) 10 MG tablet Take 10 mg by mouth every morning.      Marland Kitchen  losartan-hydrochlorothiazide (HYZAAR) 100-25 MG per tablet Take 1 tablet by mouth every morning.      . Mometasone Furo-Formoterol Fum 200-5 MCG/ACT AERO Inhale 2 puffs into the lungs 1 day or 1 dose.  1 Inhaler  11  . naproxen (NAPROSYN) 500 MG tablet Take 1 tablet (500 mg total) by mouth 2 (two) times daily as needed (pain).  60 tablet  3  . Omega-3 Fatty Acids (FISH OIL) 1000 MG CAPS Take 1 capsule by mouth every morning.       Marland Kitchen omeprazole (PRILOSEC) 20 MG capsule Take 2 capsules (40 mg total) by mouth daily.  30 capsule  11  . potassium chloride (KLOR-CON) 8 MEQ tablet Take 8 mEq by mouth every morning.      . triamcinolone (KENALOG) 0.1 % paste Place 1 application onto teeth 4 (four) times daily as needed. For pain       BP 106/64  Pulse 77  Temp 98.8 F (37.1 C) (Oral)  Resp 16  Wt 226 lb (102.513 kg)  SpO2 97%   Review of Systems  Constitutional: Negative for fever, chills, activity change, appetite change, fatigue and  unexpected weight change.  HENT: Negative for congestion, mouth sores and sinus pressure.   Eyes: Negative for visual disturbance.  Respiratory: Negative for cough and chest tightness.   Gastrointestinal: Negative for nausea and abdominal pain.  Genitourinary: Negative for frequency, difficulty urinating and vaginal pain.  Musculoskeletal: Positive for gait problem. Negative for myalgias and back pain.  Skin: Negative for pallor and rash.  Neurological: Negative for dizziness, tremors, weakness, numbness and headaches.  Psychiatric/Behavioral: Negative for confusion and sleep disturbance. The patient is nervous/anxious.    Wt Readings from Last 3 Encounters:  05/11/12 226 lb (102.513 kg)  02/10/12 221 lb (100.245 kg)  11/05/11 225 lb (102.059 kg)   BP Readings from Last 3 Encounters:  05/11/12 106/64  02/10/12 108/70  11/05/11 110/60       Objective:   Physical Exam  Constitutional: She appears well-developed. No distress.  HENT:  Head: Normocephalic.  Right Ear: External ear normal.  Left Ear: External ear normal.  Nose: Nose normal.  Mouth/Throat: Oropharynx is clear and moist.  Eyes: Conjunctivae normal are normal. Pupils are equal, round, and reactive to light. Right eye exhibits no discharge. Left eye exhibits no discharge.  Neck: Normal range of motion. Neck supple. No JVD present. No tracheal deviation present. No thyromegaly present.  Cardiovascular: Normal rate, regular rhythm and normal heart sounds.   Pulmonary/Chest: No stridor. No respiratory distress. She has no wheezes.  Abdominal: Soft. Bowel sounds are normal. She exhibits no distension and no mass. There is no tenderness. There is no rebound and no guarding.  Musculoskeletal: She exhibits no edema and no tenderness.  Lymphadenopathy:    She has no cervical adenopathy.  Neurological: She displays abnormal reflex. No cranial nerve deficit. She exhibits normal muscle tone. Coordination abnormal.  Skin: No rash  noted. No erythema.  Psychiatric: She has a normal mood and affect. Her behavior is normal. Judgment and thought content normal.  AK x3  Lab Results  Component Value Date   WBC 4.9 09/06/2011   HGB 14.5 09/06/2011   HCT 45.2 09/06/2011   PLT 186 09/06/2011   GLUCOSE 94 02/10/2012   CHOL 295* 04/02/2010   TRIG 152.0* 04/02/2010   HDL 50.90 04/02/2010   LDLDIRECT 217.8 04/02/2010   LDLCALC 107* 04/04/2009   ALT 18 02/10/2012   AST 18 02/10/2012   NA  138 02/10/2012   K 4.6 02/10/2012   CL 103 02/10/2012   CREATININE 0.8 02/10/2012   BUN 14 02/10/2012   CO2 29 02/10/2012   TSH 1.81 08/09/2011   INR 1.07 09/06/2011   HGBA1C 5.5 02/10/2012      MRI 1/13: IMPRESSION:  1. Severe multifactorial spinal and lateral recess stenosis at L4-  L5 plus moderate right foraminal stenosis.  2. Borderline to mild multifactorial spinal stenosis at T11-T12  and L3-L4.  3. Mild to moderate multifactorial left L5 foraminal stenosis.   Original Report Authenticated By: Harley Hallmark, M.D. Clinical Data: Larey Seat. Hit head.    CT HEAD WITHOUT CONTRAST 12/11 CT CERVICAL SPINE WITHOUT CONTRAST  Technique: Multidetector CT imaging of the head and cervical spine  was performed following the standard protocol without intravenous  contrast. Multiplanar CT image reconstructions of the cervical  spine were also generated.  Comparison: None  CT HEAD  Findings: There is mild age related cerebral atrophy and  ventriculomegaly. No extra-axial fluid collections are seen. No  CT findings for hemispheric infarction or intracranial hemorrhage.  No mass lesions. The brainstem and cerebellum are unremarkable.  The bony calvarium is intact. Benign appearing calvarial lesion  noted in the right frontal area. The paranasal sinuses and mastoid  air cells are clear. The globes are intact.  IMPRESSION:  1. Age related cerebral atrophy and ventriculomegaly.  2. No acute intracranial findings or mass lesions.  3. No skull  fracture.  CT CERVICAL SPINE  Findings: Advanced degenerative cervical spondylosis with disc  disease and facet disease most notable at C4-5, C5-6 and C6-7. The  facets are normally aligned. No facet or laminar fractures. No  acute vertebral body fractures or abnormal prevertebral soft tissue  swelling. The C1-2 articulations are maintained. The dens appears  normal.  No large disc protrusions are seen. There is moderate osteophytic  spurring posteriorly at C5-6 and C6-7 with mild mass effect on the  thecal sac. Mild bilateral foraminal stenosis at C5-6 and C6-7.  IMPRESSION:  1. Degenerative cervical spondylosis with disc disease and facet  disease.  2. No acute bony findings and normal alignment.   Provider: Judie Petit   Procedure Note :     Procedure : Cryosurgery   Indication:  Actinic keratosis(es) x3   Risks including unsuccessful procedure , bleeding, infection, bruising, scar, a need for a repeat  procedure and others were explained to the patient in detail as well as the benefits. Informed consent was obtained verbally.     lesion(s)  on    was/were treated with liquid nitrogen on a Q-tip in a usual fasion . Band-Aid was applied and antibiotic ointment was given for a later use.   Tolerated well. Complications none.   Postprocedure instructions :     Keep the wounds clean. You can wash them with liquid soap and water. Pat dry with gauze or a Kleenex tissue  Before applying antibiotic ointment and a Band-Aid.   You need to report immediately  if  any signs of infection develop.     Lab Results  Component Value Date   WBC 4.9 09/06/2011   HGB 14.5 09/06/2011   HCT 45.2 09/06/2011   PLT 186 09/06/2011   GLUCOSE 94 02/10/2012   CHOL 295* 04/02/2010   TRIG 152.0* 04/02/2010   HDL 50.90 04/02/2010   LDLDIRECT 217.8 04/02/2010   LDLCALC 107* 04/04/2009   ALT 18 02/10/2012   AST 18 02/10/2012   NA 138 02/10/2012  K 4.6 02/10/2012   CL 103 02/10/2012   CREATININE 0.8  02/10/2012   BUN 14 02/10/2012   CO2 29 02/10/2012   TSH 1.81 08/09/2011   INR 1.07 09/06/2011   HGBA1C 5.5 02/10/2012        Assessment & Plan:

## 2012-05-16 ENCOUNTER — Other Ambulatory Visit (INDEPENDENT_AMBULATORY_CARE_PROVIDER_SITE_OTHER): Payer: Medicare Other

## 2012-05-16 DIAGNOSIS — I10 Essential (primary) hypertension: Secondary | ICD-10-CM

## 2012-05-16 DIAGNOSIS — K219 Gastro-esophageal reflux disease without esophagitis: Secondary | ICD-10-CM | POA: Diagnosis not present

## 2012-05-16 DIAGNOSIS — J45909 Unspecified asthma, uncomplicated: Secondary | ICD-10-CM | POA: Diagnosis not present

## 2012-05-16 DIAGNOSIS — M545 Low back pain, unspecified: Secondary | ICD-10-CM

## 2012-05-16 DIAGNOSIS — F329 Major depressive disorder, single episode, unspecified: Secondary | ICD-10-CM

## 2012-05-16 DIAGNOSIS — R739 Hyperglycemia, unspecified: Secondary | ICD-10-CM

## 2012-05-16 DIAGNOSIS — I872 Venous insufficiency (chronic) (peripheral): Secondary | ICD-10-CM

## 2012-05-16 DIAGNOSIS — M79605 Pain in left leg: Secondary | ICD-10-CM

## 2012-05-16 DIAGNOSIS — M79604 Pain in right leg: Secondary | ICD-10-CM

## 2012-05-16 DIAGNOSIS — L570 Actinic keratosis: Secondary | ICD-10-CM

## 2012-05-16 DIAGNOSIS — F3289 Other specified depressive episodes: Secondary | ICD-10-CM

## 2012-05-16 DIAGNOSIS — R7309 Other abnormal glucose: Secondary | ICD-10-CM

## 2012-05-16 LAB — LDL CHOLESTEROL, DIRECT: Direct LDL: 159.1 mg/dL

## 2012-05-16 LAB — LIPID PANEL
Cholesterol: 226 mg/dL — ABNORMAL HIGH (ref 0–200)
HDL: 50.3 mg/dL (ref >39.00)
Total CHOL/HDL Ratio: 4
Triglycerides: 123 mg/dL (ref 0.0–149.0)
VLDL: 24.6 mg/dL (ref 0.0–40.0)

## 2012-05-16 LAB — BASIC METABOLIC PANEL
BUN: 17 mg/dL (ref 6–23)
CO2: 29 mEq/L (ref 19–32)
Calcium: 9 mg/dL (ref 8.4–10.5)
Chloride: 103 mEq/L (ref 96–112)
Creatinine, Ser: 0.9 mg/dL (ref 0.4–1.2)
GFR: 68.07 mL/min (ref >60.00)
Glucose, Bld: 105 mg/dL — ABNORMAL HIGH (ref 70–99)
Potassium: 4.5 mEq/L (ref 3.5–5.1)
Sodium: 139 mEq/L (ref 135–145)

## 2012-05-16 LAB — HEMOGLOBIN A1C: Hgb A1c MFr Bld: 5.7 % (ref 4.6–6.5)

## 2012-05-16 LAB — TSH: TSH: 1.93 u[IU]/mL (ref 0.35–5.50)

## 2012-05-30 ENCOUNTER — Other Ambulatory Visit: Payer: Self-pay | Admitting: *Deleted

## 2012-05-30 MED ORDER — POTASSIUM CHLORIDE ER 8 MEQ PO TBCR: 8.0000 meq | EXTENDED_RELEASE_TABLET | Freq: Every morning | ORAL | Status: DC

## 2012-06-05 ENCOUNTER — Other Ambulatory Visit: Payer: Self-pay | Admitting: *Deleted

## 2012-06-05 MED ORDER — FLUOXETINE HCL 10 MG PO TABS: 20.0000 mg | ORAL_TABLET | Freq: Every day | ORAL | Status: DC

## 2012-08-11 ENCOUNTER — Ambulatory Visit: Payer: Medicare Other | Admitting: Internal Medicine

## 2012-08-15 ENCOUNTER — Encounter: Payer: Self-pay | Admitting: Internal Medicine

## 2012-08-15 ENCOUNTER — Other Ambulatory Visit (INDEPENDENT_AMBULATORY_CARE_PROVIDER_SITE_OTHER): Payer: Medicare Other

## 2012-08-15 ENCOUNTER — Ambulatory Visit (INDEPENDENT_AMBULATORY_CARE_PROVIDER_SITE_OTHER): Payer: Medicare Other | Admitting: Internal Medicine

## 2012-08-15 VITALS — BP 140/80 | HR 80 | Temp 97.9°F | Resp 16 | Wt 232.0 lb

## 2012-08-15 DIAGNOSIS — F3289 Other specified depressive episodes: Secondary | ICD-10-CM | POA: Diagnosis not present

## 2012-08-15 DIAGNOSIS — G4700 Insomnia, unspecified: Secondary | ICD-10-CM

## 2012-08-15 DIAGNOSIS — F329 Major depressive disorder, single episode, unspecified: Secondary | ICD-10-CM | POA: Diagnosis not present

## 2012-08-15 DIAGNOSIS — M79605 Pain in left leg: Secondary | ICD-10-CM

## 2012-08-15 DIAGNOSIS — J45901 Unspecified asthma with (acute) exacerbation: Secondary | ICD-10-CM

## 2012-08-15 DIAGNOSIS — R209 Unspecified disturbances of skin sensation: Secondary | ICD-10-CM | POA: Diagnosis not present

## 2012-08-15 DIAGNOSIS — M545 Low back pain, unspecified: Secondary | ICD-10-CM

## 2012-08-15 DIAGNOSIS — R7309 Other abnormal glucose: Secondary | ICD-10-CM

## 2012-08-15 DIAGNOSIS — M79604 Pain in right leg: Secondary | ICD-10-CM

## 2012-08-15 DIAGNOSIS — F411 Generalized anxiety disorder: Secondary | ICD-10-CM

## 2012-08-15 DIAGNOSIS — R609 Edema, unspecified: Secondary | ICD-10-CM

## 2012-08-15 DIAGNOSIS — R739 Hyperglycemia, unspecified: Secondary | ICD-10-CM

## 2012-08-15 DIAGNOSIS — R202 Paresthesia of skin: Secondary | ICD-10-CM

## 2012-08-15 DIAGNOSIS — I10 Essential (primary) hypertension: Secondary | ICD-10-CM | POA: Diagnosis not present

## 2012-08-15 LAB — HEPATIC FUNCTION PANEL
ALT: 17 U/L (ref 0–35)
AST: 17 U/L (ref 0–37)
Albumin: 3.9 g/dL (ref 3.5–5.2)
Alkaline Phosphatase: 67 U/L (ref 39–117)
Bilirubin, Direct: 0.1 mg/dL (ref 0.0–0.3)
Total Bilirubin: 0.7 mg/dL (ref 0.3–1.2)
Total Protein: 7 g/dL (ref 6.0–8.3)

## 2012-08-15 LAB — BASIC METABOLIC PANEL
BUN: 14 mg/dL (ref 6–23)
CO2: 31 mEq/L (ref 19–32)
Calcium: 9.1 mg/dL (ref 8.4–10.5)
Chloride: 103 mEq/L (ref 96–112)
Creatinine, Ser: 0.8 mg/dL (ref 0.4–1.2)
GFR: 69.9 mL/min (ref >60.00)
Glucose, Bld: 101 mg/dL — ABNORMAL HIGH (ref 70–99)
Potassium: 4.6 mEq/L (ref 3.5–5.1)
Sodium: 140 mEq/L (ref 135–145)

## 2012-08-15 LAB — URINALYSIS
Bilirubin Urine: NEGATIVE
Hgb urine dipstick: NEGATIVE
Ketones, ur: NEGATIVE
Leukocytes, UA: NEGATIVE
Nitrite: NEGATIVE
Specific Gravity, Urine: 1.025 (ref 1.000–1.030)
Total Protein, Urine: NEGATIVE
Urine Glucose: NEGATIVE
Urobilinogen, UA: 0.2 (ref 0.0–1.0)
pH: 6 (ref 5.0–8.0)

## 2012-08-15 LAB — HEMOGLOBIN A1C: Hgb A1c MFr Bld: 5.7 % (ref 4.6–6.5)

## 2012-08-15 LAB — TSH: TSH: 1.78 u[IU]/mL (ref 0.35–5.50)

## 2012-08-15 LAB — VITAMIN B12: Vitamin B-12: 536 pg/mL (ref 211–911)

## 2012-08-15 MED ORDER — CLONAZEPAM 0.25 MG PO TBDP: 0.2500 mg | ORAL_TABLET | Freq: Two times a day (BID) | ORAL | Status: DC | PRN

## 2012-08-15 NOTE — Assessment & Plan Note (Signed)
Check CBG 

## 2012-08-15 NOTE — Assessment & Plan Note (Signed)
Continue with current prescription therapy as reflected on the Med list.  

## 2012-08-15 NOTE — Assessment & Plan Note (Signed)
Clonazepam prn 

## 2012-08-15 NOTE — Progress Notes (Signed)
Subjective:     HPI  C/o diziness. C/o wt gain. C/o feet burning at times. F/u weakness leading to ataxia and falls (spinal stenosis). Had no more falls. F/u asthma, dizziness - better. Teeth were removed. She stopped using Dulera 6 wks ago. She stopped BP meds.   Past Medical History  Diagnosis Date  . Allergy     rhinitis  . Asthma   . Hyperlipidemia   . Hypertension   . Depression   . GERD (gastroesophageal reflux disease)   . Osteoarthritis   . Obesity   . Urinary incontinence   . Shoulder injury     left  . COPD (chronic obstructive pulmonary disease)   . Osteoporosis   . Gallstones   . Peripheral vascular disease     swelling   . Shortness of breath     with exertion   . Pneumonia   . Sleep apnea     never diagnosed   . Chronic kidney disease     hx of cystitis   . Dizziness     vertigo    Past Surgical History  Procedure Laterality Date  . Right left knee arthroplast    . L tkr    . Other surgical history      left leg surgery   . Other surgical history      left breast surgery due to mastitis   . Liver biopsy  09/14/2011    Procedure: LIVER BIOPSY;  Surgeon: Adolph Pollack, MD;  Location: [facility];  Service: General;  Laterality: N/A;  . Cholecystectomy  09/14/2011    Procedure: LAPAROSCOPIC CHOLECYSTECTOMY;  Surgeon: Adolph Pollack, MD;  Location: [facility];  Service: General;  Laterality: N/A;  attempted intraoperative cholangiogram     reports that she quit smoking about 11 years ago. Her smoking use included Cigarettes. She has a 17.5 pack-year smoking history. She has never used smokeless tobacco. She reports that  drinks alcohol. She reports that she does not use illicit drugs. family history includes Arthritis in her mother; Diabetes in her mother; Hypertension in her father, mother, and other; and Stroke in her father and mother. Allergies  Allergen Reactions  . Atorvastatin     REACTION: upset stomach  . Enalapril Maleate   . Hctz  (Hydrochlorothiazide)     Dizzy   . Lovastatin     REACTION: tongue stress  . Simvastatin     REACTION: mouth sores   Current Outpatient Prescriptions on File Prior to Visit  Medication Sig Dispense Refill  . ALPRAZolam (XANAX) 0.5 MG tablet Take 0.5 mg by mouth 2 (two) times daily as needed. For anxiety      . aspirin 81 MG tablet Take 81 mg by mouth every morning.       . B Complex-C (SUPER B COMPLEX PO) Take 1 tablet by mouth every morning.       . Cholecalciferol 1000 UNITS capsule Take 1,000 Units by mouth every morning.       Marland Kitchen FLUoxetine (PROZAC) 10 MG tablet Take 2 tablets (20 mg total) by mouth daily.  60 tablet  5  . furosemide (LASIX) 20 MG tablet Take 1 tablet (20 mg total) by mouth daily as needed (swelling).  30 tablet  5  . loratadine (CLARITIN) 10 MG tablet Take 10 mg by mouth every morning.      Marland Kitchen losartan-hydrochlorothiazide (HYZAAR) 100-25 MG per tablet Take 1 tablet by mouth every morning.      Marland Kitchen  Mometasone Furo-Formoterol Fum 200-5 MCG/ACT AERO Inhale 2 puffs into the lungs 1 day or 1 dose.  1 Inhaler  11  . naproxen (NAPROSYN) 500 MG tablet Take 1 tablet (500 mg total) by mouth 2 (two) times daily as needed (pain).  60 tablet  3  . Omega-3 Fatty Acids (FISH OIL) 1000 MG CAPS Take 1 capsule by mouth every morning.       Marland Kitchen omeprazole (PRILOSEC) 20 MG capsule Take 2 capsules (40 mg total) by mouth daily.  30 capsule  11  . potassium chloride (KLOR-CON) 8 MEQ tablet Take 1 tablet (8 mEq total) by mouth every morning.  30 tablet  5  . triamcinolone (KENALOG) 0.1 % paste Place 1 application onto teeth 4 (four) times daily as needed. For pain       No current facility-administered medications on file prior to visit.   BP 140/80  Pulse 80  Temp(Src) 97.9 F (36.6 C) (Oral)  Resp 16  Wt 232 lb (105.235 kg)  BMI 39.8 kg/m2   Review of Systems  Constitutional: Negative for fever, chills, activity change, appetite change, fatigue and unexpected weight change.  HENT:  Negative for congestion, mouth sores and sinus pressure.   Eyes: Negative for visual disturbance.  Respiratory: Negative for cough and chest tightness.   Gastrointestinal: Negative for nausea and abdominal pain.  Genitourinary: Negative for frequency, difficulty urinating and vaginal pain.  Musculoskeletal: Positive for gait problem. Negative for myalgias and back pain.  Skin: Negative for pallor and rash.  Neurological: Negative for dizziness, tremors, weakness, numbness and headaches.  Psychiatric/Behavioral: Negative for confusion and sleep disturbance. The patient is nervous/anxious.    Wt Readings from Last 3 Encounters:  08/15/12 232 lb (105.235 kg)  05/11/12 226 lb (102.513 kg)  02/10/12 221 lb (100.245 kg)   BP Readings from Last 3 Encounters:  08/15/12 140/80  05/11/12 106/64  02/10/12 108/70       Objective:   Physical Exam  Constitutional: She appears well-developed. No distress.  HENT:  Head: Normocephalic.  Right Ear: External ear normal.  Left Ear: External ear normal.  Nose: Nose normal.  Mouth/Throat: Oropharynx is clear and moist.  Eyes: Conjunctivae are normal. Pupils are equal, round, and reactive to light. Right eye exhibits no discharge. Left eye exhibits no discharge.  Neck: Normal range of motion. Neck supple. No JVD present. No tracheal deviation present. No thyromegaly present.  Cardiovascular: Normal rate, regular rhythm and normal heart sounds.   Pulmonary/Chest: No stridor. No respiratory distress. She has no wheezes.  Abdominal: Soft. Bowel sounds are normal. She exhibits no distension and no mass. There is no tenderness. There is no rebound and no guarding.  Musculoskeletal: She exhibits no edema and no tenderness.  Lymphadenopathy:    She has no cervical adenopathy.  Neurological: She displays abnormal reflex. No cranial nerve deficit. She exhibits normal muscle tone. Coordination abnormal.  Skin: No rash noted. No erythema.  Psychiatric: She  has a normal mood and affect. Her behavior is normal. Judgment and thought content normal.    Lab Results  Component Value Date   WBC 4.9 09/06/2011   HGB 14.5 09/06/2011   HCT 45.2 09/06/2011   PLT 186 09/06/2011   GLUCOSE 105* 05/16/2012   CHOL 226* 05/16/2012   TRIG 123.0 05/16/2012   HDL 50.30 05/16/2012   LDLDIRECT 159.1 05/16/2012   LDLCALC 107* 04/04/2009   ALT 18 02/10/2012   AST 18 02/10/2012   NA 139 05/16/2012   K  4.5 05/16/2012   CL 103 05/16/2012   CREATININE 0.9 05/16/2012   BUN 17 05/16/2012   CO2 29 05/16/2012   TSH 1.93 05/16/2012   INR 1.07 09/06/2011   HGBA1C 5.7 05/16/2012      MRI 1/13: IMPRESSION:  1. Severe multifactorial spinal and lateral recess stenosis at L4-  L5 plus moderate right foraminal stenosis.  2. Borderline to mild multifactorial spinal stenosis at T11-T12  and L3-L4.  3. Mild to moderate multifactorial left L5 foraminal stenosis.   Original Report Authenticated By: Harley Hallmark, M.D. Clinical Data: Larey Seat. Hit head.    CT HEAD WITHOUT CONTRAST 12/11 CT CERVICAL SPINE WITHOUT CONTRAST  Technique: Multidetector CT imaging of the head and cervical spine  was performed following the standard protocol without intravenous  contrast. Multiplanar CT image reconstructions of the cervical  spine were also generated.  Comparison: None  CT HEAD  Findings: There is mild age related cerebral atrophy and  ventriculomegaly. No extra-axial fluid collections are seen. No  CT findings for hemispheric infarction or intracranial hemorrhage.  No mass lesions. The brainstem and cerebellum are unremarkable.  The bony calvarium is intact. Benign appearing calvarial lesion  noted in the right frontal area. The paranasal sinuses and mastoid  air cells are clear. The globes are intact.  IMPRESSION:  1. Age related cerebral atrophy and ventriculomegaly.  2. No acute intracranial findings or mass lesions.  3. No skull fracture.  CT CERVICAL SPINE   Findings: Advanced degenerative cervical spondylosis with disc  disease and facet disease most notable at C4-5, C5-6 and C6-7. The  facets are normally aligned. No facet or laminar fractures. No  acute vertebral body fractures or abnormal prevertebral soft tissue  swelling. The C1-2 articulations are maintained. The dens appears  normal.  No large disc protrusions are seen. There is moderate osteophytic  spurring posteriorly at C5-6 and C6-7 with mild mass effect on the  thecal sac. Mild bilateral foraminal stenosis at C5-6 and C6-7.  IMPRESSION:  1. Degenerative cervical spondylosis with disc disease and facet  disease.  2. No acute bony findings and normal alignment.   Provider: Judie Petit      Lab Results  Component Value Date   WBC 4.9 09/06/2011   HGB 14.5 09/06/2011   HCT 45.2 09/06/2011   PLT 186 09/06/2011   GLUCOSE 105* 05/16/2012   CHOL 226* 05/16/2012   TRIG 123.0 05/16/2012   HDL 50.30 05/16/2012   LDLDIRECT 159.1 05/16/2012   LDLCALC 107* 04/04/2009   ALT 18 02/10/2012   AST 18 02/10/2012   NA 139 05/16/2012   K 4.5 05/16/2012   CL 103 05/16/2012   CREATININE 0.9 05/16/2012   BUN 17 05/16/2012   CO2 29 05/16/2012   TSH 1.93 05/16/2012   INR 1.07 09/06/2011   HGBA1C 5.7 05/16/2012        Assessment & Plan:

## 2012-08-15 NOTE — Assessment & Plan Note (Signed)
Off Losartan lately - BP is ok

## 2012-09-25 ENCOUNTER — Other Ambulatory Visit: Payer: Self-pay | Admitting: Internal Medicine

## 2012-10-25 ENCOUNTER — Ambulatory Visit (INDEPENDENT_AMBULATORY_CARE_PROVIDER_SITE_OTHER): Payer: Medicare Other | Admitting: Internal Medicine

## 2012-10-25 ENCOUNTER — Encounter: Payer: Self-pay | Admitting: Internal Medicine

## 2012-10-25 VITALS — BP 140/80 | HR 80 | Temp 99.0°F | Resp 16 | Wt 230.0 lb

## 2012-10-25 DIAGNOSIS — I10 Essential (primary) hypertension: Secondary | ICD-10-CM | POA: Diagnosis not present

## 2012-10-25 DIAGNOSIS — J209 Acute bronchitis, unspecified: Secondary | ICD-10-CM

## 2012-10-25 DIAGNOSIS — J45909 Unspecified asthma, uncomplicated: Secondary | ICD-10-CM

## 2012-10-25 DIAGNOSIS — J45901 Unspecified asthma with (acute) exacerbation: Secondary | ICD-10-CM

## 2012-10-25 MED ORDER — AZITHROMYCIN 250 MG PO TABS: ORAL_TABLET | ORAL | Status: DC

## 2012-10-25 MED ORDER — PROMETHAZINE-CODEINE 6.25-10 MG/5ML PO SYRP: 5.0000 mL | ORAL_SOLUTION | ORAL | Status: DC | PRN

## 2012-10-25 MED ORDER — MOMETASONE FURO-FORMOTEROL FUM 200-5 MCG/ACT IN AERO: 2.0000 | INHALATION_SPRAY | RESPIRATORY_TRACT | Status: DC

## 2012-10-25 NOTE — Assessment & Plan Note (Signed)
C/o low BP with SBP in 80's after taking one or  1/2 tab Losartan - will hold Rx

## 2012-10-25 NOTE — Assessment & Plan Note (Signed)
Restart Dulera Zpac

## 2012-10-25 NOTE — Assessment & Plan Note (Signed)
Restart Dulera bid 

## 2012-10-25 NOTE — Assessment & Plan Note (Signed)
Zpac Prom-cod syr 

## 2012-10-25 NOTE — Progress Notes (Signed)
Subjective:     HPI  C/o URI and cough C/o diziness. C/o wt gain. C/o feet burning at times. F/u weakness leading to ataxia and falls (spinal stenosis). Had no more falls. F/u asthma, dizziness - better. Teeth were removed. She stopped using Dulera 6 wks ago. She stopped Losartan - C/o low BP with SBP in 80's after taking one or  1/2 tab Losartan   Past Medical History  Diagnosis Date  . Allergy     rhinitis  . Asthma   . Hyperlipidemia   . Hypertension   . Depression   . GERD (gastroesophageal reflux disease)   . Osteoarthritis   . Obesity   . Urinary incontinence   . Shoulder injury     left  . COPD (chronic obstructive pulmonary disease)   . Osteoporosis   . Gallstones   . Peripheral vascular disease     swelling   . Shortness of breath     with exertion   . Pneumonia   . Sleep apnea     never diagnosed   . Chronic kidney disease     hx of cystitis   . Dizziness     vertigo    Past Surgical History  Procedure Laterality Date  . Right left knee arthroplast    . L tkr    . Other surgical history      left leg surgery   . Other surgical history      left breast surgery due to mastitis   . Liver biopsy  09/14/2011    Procedure: LIVER BIOPSY;  Surgeon: Adolph Pollack, MD;  Location: [facility];  Service: General;  Laterality: N/A;  . Cholecystectomy  09/14/2011    Procedure: LAPAROSCOPIC CHOLECYSTECTOMY;  Surgeon: Adolph Pollack, MD;  Location: [facility];  Service: General;  Laterality: N/A;  attempted intraoperative cholangiogram     reports that she quit smoking about 11 years ago. Her smoking use included Cigarettes. She has a 17.5 pack-year smoking history. She has never used smokeless tobacco. She reports that  drinks alcohol. She reports that she does not use illicit drugs. family history includes Arthritis in her mother; Diabetes in her mother; Hypertension in her father, mother, and other; and Stroke in her father and mother. Allergies  Allergen Reactions   . Atorvastatin     REACTION: upset stomach  . Enalapril Maleate   . Hctz (Hydrochlorothiazide)     Dizzy   . Lovastatin     REACTION: tongue stress  . Simvastatin     REACTION: mouth sores   Current Outpatient Prescriptions on File Prior to Visit  Medication Sig Dispense Refill  . aspirin 81 MG tablet Take 81 mg by mouth every morning.       . B Complex-C (SUPER B COMPLEX PO) Take 1 tablet by mouth every morning.       . Cholecalciferol 1000 UNITS capsule Take 1,000 Units by mouth every morning.       . clonazePAM (KLONOPIN) 0.25 MG disintegrating tablet Take 1 tablet (0.25 mg total) by mouth 2 (two) times daily as needed (insomnia or anxiety).  60 tablet  1  . FLUoxetine (PROZAC) 10 MG tablet Take 2 tablets (20 mg total) by mouth daily.  60 tablet  5  . furosemide (LASIX) 20 MG tablet Take 1 tablet (20 mg total) by mouth daily as needed (swelling).  30 tablet  5  . loratadine (CLARITIN) 10 MG tablet Take 10 mg by mouth every morning.      Marland Kitchen  Mometasone Furo-Formoterol Fum 200-5 MCG/ACT AERO Inhale 2 puffs into the lungs 1 day or 1 dose.  1 Inhaler  11  . naproxen (NAPROSYN) 500 MG tablet TAKE 1 TABLET TWICE A DAY AS NEEDED FOR PAIN  60 tablet  1  . Omega-3 Fatty Acids (FISH OIL) 1000 MG CAPS Take 1 capsule by mouth every morning.       Marland Kitchen omeprazole (PRILOSEC) 20 MG capsule Take 2 capsules (40 mg total) by mouth daily.  30 capsule  11  . potassium chloride (KLOR-CON) 8 MEQ tablet Take 1 tablet (8 mEq total) by mouth every morning.  30 tablet  5  . triamcinolone (KENALOG) 0.1 % paste Place 1 application onto teeth 4 (four) times daily as needed. For pain      . losartan-hydrochlorothiazide (HYZAAR) 100-25 MG per tablet Take 1 tablet by mouth every morning.       No current facility-administered medications on file prior to visit.   BP 140/80  Pulse 80  Temp(Src) 99 F (37.2 C) (Oral)  Resp 16  Wt 230 lb (104.327 kg)  BMI 39.46 kg/m2   Review of Systems  Constitutional:  Negative for fever, chills, activity change, appetite change, fatigue and unexpected weight change.  HENT: Negative for congestion, mouth sores and sinus pressure.   Eyes: Negative for visual disturbance.  Respiratory: Negative for cough and chest tightness.   Gastrointestinal: Negative for nausea and abdominal pain.  Genitourinary: Negative for frequency, difficulty urinating and vaginal pain.  Musculoskeletal: Positive for gait problem. Negative for myalgias and back pain.  Skin: Negative for pallor and rash.  Neurological: Negative for dizziness, tremors, weakness, numbness and headaches.  Psychiatric/Behavioral: Negative for confusion and sleep disturbance. The patient is nervous/anxious.    Wt Readings from Last 3 Encounters:  10/25/12 230 lb (104.327 kg)  08/15/12 232 lb (105.235 kg)  05/11/12 226 lb (102.513 kg)   BP Readings from Last 3 Encounters:  10/25/12 140/80  08/15/12 140/80  05/11/12 106/64       Objective:   Physical Exam  Constitutional: She appears well-developed. No distress.  HENT:  Head: Normocephalic.  Right Ear: External ear normal.  Left Ear: External ear normal.  Nose: Nose normal.  Mouth/Throat: Oropharynx is clear and moist.  Eyes: Conjunctivae are normal. Pupils are equal, round, and reactive to light. Right eye exhibits no discharge. Left eye exhibits no discharge.  Neck: Normal range of motion. Neck supple. No JVD present. No tracheal deviation present. No thyromegaly present.  Cardiovascular: Normal rate, regular rhythm and normal heart sounds.   Pulmonary/Chest: No stridor. No respiratory distress. She has no wheezes.  Abdominal: Soft. Bowel sounds are normal. She exhibits no distension and no mass. There is no tenderness. There is no rebound and no guarding.  Musculoskeletal: She exhibits no edema and no tenderness.  Lymphadenopathy:    She has no cervical adenopathy.  Neurological: She displays abnormal reflex. No cranial nerve deficit. She  exhibits normal muscle tone. Coordination abnormal.  Skin: No rash noted. No erythema.  Psychiatric: She has a normal mood and affect. Her behavior is normal. Judgment and thought content normal.    Lab Results  Component Value Date   WBC 4.9 09/06/2011   HGB 14.5 09/06/2011   HCT 45.2 09/06/2011   PLT 186 09/06/2011   GLUCOSE 101* 08/15/2012   CHOL 226* 05/16/2012   TRIG 123.0 05/16/2012   HDL 50.30 05/16/2012   LDLDIRECT 159.1 05/16/2012   LDLCALC 107* 04/04/2009   ALT 17  08/15/2012   AST 17 08/15/2012   NA 140 08/15/2012   K 4.6 08/15/2012   CL 103 08/15/2012   CREATININE 0.8 08/15/2012   BUN 14 08/15/2012   CO2 31 08/15/2012   TSH 1.78 08/15/2012   INR 1.07 09/06/2011   HGBA1C 5.7 08/15/2012      MRI 1/13: IMPRESSION:  1. Severe multifactorial spinal and lateral recess stenosis at L4-  L5 plus moderate right foraminal stenosis.  2. Borderline to mild multifactorial spinal stenosis at T11-T12  and L3-L4.  3. Mild to moderate multifactorial left L5 foraminal stenosis.   Original Report Authenticated By: Harley Hallmark, M.D. Clinical Data: Larey Seat. Hit head.    CT HEAD WITHOUT CONTRAST 12/11 CT CERVICAL SPINE WITHOUT CONTRAST  Technique: Multidetector CT imaging of the head and cervical spine  was performed following the standard protocol without intravenous  contrast. Multiplanar CT image reconstructions of the cervical  spine were also generated.  Comparison: None  CT HEAD  Findings: There is mild age related cerebral atrophy and  ventriculomegaly. No extra-axial fluid collections are seen. No  CT findings for hemispheric infarction or intracranial hemorrhage.  No mass lesions. The brainstem and cerebellum are unremarkable.  The bony calvarium is intact. Benign appearing calvarial lesion  noted in the right frontal area. The paranasal sinuses and mastoid  air cells are clear. The globes are intact.  IMPRESSION:  1. Age related cerebral atrophy and ventriculomegaly.  2. No  acute intracranial findings or mass lesions.  3. No skull fracture.  CT CERVICAL SPINE  Findings: Advanced degenerative cervical spondylosis with disc  disease and facet disease most notable at C4-5, C5-6 and C6-7. The  facets are normally aligned. No facet or laminar fractures. No  acute vertebral body fractures or abnormal prevertebral soft tissue  swelling. The C1-2 articulations are maintained. The dens appears  normal.  No large disc protrusions are seen. There is moderate osteophytic  spurring posteriorly at C5-6 and C6-7 with mild mass effect on the  thecal sac. Mild bilateral foraminal stenosis at C5-6 and C6-7.  IMPRESSION:  1. Degenerative cervical spondylosis with disc disease and facet  disease.  2. No acute bony findings and normal alignment.   Provider: Judie Petit      Lab Results  Component Value Date   WBC 4.9 09/06/2011   HGB 14.5 09/06/2011   HCT 45.2 09/06/2011   PLT 186 09/06/2011   GLUCOSE 101* 08/15/2012   CHOL 226* 05/16/2012   TRIG 123.0 05/16/2012   HDL 50.30 05/16/2012   LDLDIRECT 159.1 05/16/2012   LDLCALC 107* 04/04/2009   ALT 17 08/15/2012   AST 17 08/15/2012   NA 140 08/15/2012   K 4.6 08/15/2012   CL 103 08/15/2012   CREATININE 0.8 08/15/2012   BUN 14 08/15/2012   CO2 31 08/15/2012   TSH 1.78 08/15/2012   INR 1.07 09/06/2011   HGBA1C 5.7 08/15/2012        Assessment & Plan:

## 2012-11-17 ENCOUNTER — Ambulatory Visit: Payer: Medicare Other | Admitting: Internal Medicine

## 2012-11-20 ENCOUNTER — Ambulatory Visit (INDEPENDENT_AMBULATORY_CARE_PROVIDER_SITE_OTHER): Payer: Medicare Other | Admitting: Internal Medicine

## 2012-11-20 ENCOUNTER — Encounter: Payer: Self-pay | Admitting: Internal Medicine

## 2012-11-20 VITALS — BP 130/70 | HR 68 | Temp 97.9°F | Resp 16 | Wt 228.0 lb

## 2012-11-20 DIAGNOSIS — M545 Low back pain, unspecified: Secondary | ICD-10-CM | POA: Diagnosis not present

## 2012-11-20 DIAGNOSIS — I10 Essential (primary) hypertension: Secondary | ICD-10-CM | POA: Diagnosis not present

## 2012-11-20 DIAGNOSIS — F329 Major depressive disorder, single episode, unspecified: Secondary | ICD-10-CM

## 2012-11-20 DIAGNOSIS — F3289 Other specified depressive episodes: Secondary | ICD-10-CM

## 2012-11-20 DIAGNOSIS — R5381 Other malaise: Secondary | ICD-10-CM

## 2012-11-20 DIAGNOSIS — R5383 Other fatigue: Secondary | ICD-10-CM

## 2012-11-20 DIAGNOSIS — M79604 Pain in right leg: Secondary | ICD-10-CM

## 2012-11-20 DIAGNOSIS — M79605 Pain in left leg: Secondary | ICD-10-CM

## 2012-11-20 MED ORDER — TRAMADOL HCL 50 MG PO TABS: 50.0000 mg | ORAL_TABLET | Freq: Three times a day (TID) | ORAL | Status: DC | PRN

## 2012-11-20 NOTE — Assessment & Plan Note (Signed)
Discussed.

## 2012-11-20 NOTE — Assessment & Plan Note (Signed)
Continue with current prescription therapy as reflected on the Med list.  

## 2012-11-20 NOTE — Progress Notes (Signed)
Subjective:     HPI   C/o wt gain. C/o feet burning at times. F/u weakness leading to ataxia and falls (spinal stenosis). Had no more falls. F/u asthma, dizziness - better. Teeth were removed. She stopped using Dulera 6 wks ago. She stopped Losartan - C/o low BP with SBP in 80's after taking one or  1/2 tab Losartan   Past Medical History  Diagnosis Date  . Allergy     rhinitis  . Asthma   . Hyperlipidemia   . Hypertension   . Depression   . GERD (gastroesophageal reflux disease)   . Osteoarthritis   . Obesity   . Urinary incontinence   . Shoulder injury     left  . COPD (chronic obstructive pulmonary disease)   . Osteoporosis   . Gallstones   . Peripheral vascular disease     swelling   . Shortness of breath     with exertion   . Pneumonia   . Sleep apnea     never diagnosed   . Chronic kidney disease     hx of cystitis   . Dizziness     vertigo    Past Surgical History  Procedure Laterality Date  . Right left knee arthroplast    . L tkr    . Other surgical history      left leg surgery   . Other surgical history      left breast surgery due to mastitis   . Liver biopsy  09/14/2011    Procedure: LIVER BIOPSY;  Surgeon: Adolph Pollack, MD;  Location: [facility];  Service: General;  Laterality: N/A;  . Cholecystectomy  09/14/2011    Procedure: LAPAROSCOPIC CHOLECYSTECTOMY;  Surgeon: Adolph Pollack, MD;  Location: [facility];  Service: General;  Laterality: N/A;  attempted intraoperative cholangiogram     reports that she quit smoking about 11 years ago. Her smoking use included Cigarettes. She has a 17.5 pack-year smoking history. She has never used smokeless tobacco. She reports that  drinks alcohol. She reports that she does not use illicit drugs. family history includes Arthritis in her mother; Diabetes in her mother; Hypertension in her father, mother, and other; and Stroke in her father and mother. Allergies  Allergen Reactions  . Atorvastatin      REACTION: upset stomach  . Enalapril Maleate   . Hctz (Hydrochlorothiazide)     Dizzy   . Lovastatin     REACTION: tongue stress  . Simvastatin     REACTION: mouth sores   Current Outpatient Prescriptions on File Prior to Visit  Medication Sig Dispense Refill  . aspirin 81 MG tablet Take 81 mg by mouth every morning.       Marland Kitchen azithromycin (ZITHROMAX Z-PAK) 250 MG tablet As directed  6 each  0  . B Complex-C (SUPER B COMPLEX PO) Take 1 tablet by mouth every morning.       . Cholecalciferol 1000 UNITS capsule Take 1,000 Units by mouth every morning.       . clonazePAM (KLONOPIN) 0.25 MG disintegrating tablet Take 1 tablet (0.25 mg total) by mouth 2 (two) times daily as needed (insomnia or anxiety).  60 tablet  1  . FLUoxetine (PROZAC) 10 MG tablet Take 2 tablets (20 mg total) by mouth daily.  60 tablet  5  . furosemide (LASIX) 20 MG tablet Take 1 tablet (20 mg total) by mouth daily as needed (swelling).  30 tablet  5  . loratadine (CLARITIN)  10 MG tablet Take 10 mg by mouth every morning.      . mometasone-formoterol (DULERA) 200-5 MCG/ACT AERO Inhale 2 puffs into the lungs 1 day or 1 dose.  1 Inhaler  11  . naproxen (NAPROSYN) 500 MG tablet TAKE 1 TABLET TWICE A DAY AS NEEDED FOR PAIN  60 tablet  1  . Omega-3 Fatty Acids (FISH OIL) 1000 MG CAPS Take 1 capsule by mouth every morning.       Marland Kitchen omeprazole (PRILOSEC) 20 MG capsule Take 2 capsules (40 mg total) by mouth daily.  30 capsule  11  . potassium chloride (KLOR-CON) 8 MEQ tablet Take 1 tablet (8 mEq total) by mouth every morning.  30 tablet  5  . promethazine-codeine (PHENERGAN WITH CODEINE) 6.25-10 MG/5ML syrup Take 5 mLs by mouth every 4 (four) hours as needed for cough.  300 mL  0  . triamcinolone (KENALOG) 0.1 % paste Place 1 application onto teeth 4 (four) times daily as needed. For pain      . losartan-hydrochlorothiazide (HYZAAR) 100-25 MG per tablet Take 1 tablet by mouth every morning.       No current facility-administered  medications on file prior to visit.   BP 130/70  Pulse 68  Temp(Src) 97.9 F (36.6 C) (Oral)  Resp 16  Wt 228 lb (103.42 kg)  BMI 39.12 kg/m2   Review of Systems  Constitutional: Negative for fever, chills, activity change, appetite change, fatigue and unexpected weight change.  HENT: Negative for congestion, mouth sores and sinus pressure.   Eyes: Negative for visual disturbance.  Respiratory: Negative for cough and chest tightness.   Gastrointestinal: Negative for nausea and abdominal pain.  Genitourinary: Negative for frequency, difficulty urinating and vaginal pain.  Musculoskeletal: Positive for gait problem. Negative for myalgias and back pain.  Skin: Negative for pallor and rash.  Neurological: Negative for dizziness, tremors, weakness, numbness and headaches.  Psychiatric/Behavioral: Negative for confusion and sleep disturbance. The patient is nervous/anxious.    Wt Readings from Last 3 Encounters:  11/20/12 228 lb (103.42 kg)  10/25/12 230 lb (104.327 kg)  08/15/12 232 lb (105.235 kg)   BP Readings from Last 3 Encounters:  11/20/12 130/70  10/25/12 140/80  08/15/12 140/80       Objective:   Physical Exam  Constitutional: She appears well-developed. No distress.  HENT:  Head: Normocephalic.  Right Ear: External ear normal.  Left Ear: External ear normal.  Nose: Nose normal.  Mouth/Throat: Oropharynx is clear and moist.  Eyes: Conjunctivae are normal. Pupils are equal, round, and reactive to light. Right eye exhibits no discharge. Left eye exhibits no discharge.  Neck: Normal range of motion. Neck supple. No JVD present. No tracheal deviation present. No thyromegaly present.  Cardiovascular: Normal rate, regular rhythm and normal heart sounds.   Pulmonary/Chest: No stridor. No respiratory distress. She has no wheezes.  Abdominal: Soft. Bowel sounds are normal. She exhibits no distension and no mass. There is no tenderness. There is no rebound and no guarding.   Musculoskeletal: She exhibits no edema and no tenderness.  Lymphadenopathy:    She has no cervical adenopathy.  Neurological: She displays abnormal reflex. No cranial nerve deficit. She exhibits normal muscle tone. Coordination abnormal.  Skin: No rash noted. No erythema.  Psychiatric: She has a normal mood and affect. Her behavior is normal. Judgment and thought content normal.    Lab Results  Component Value Date   WBC 4.9 09/06/2011   HGB 14.5 09/06/2011  HCT 45.2 09/06/2011   PLT 186 09/06/2011   GLUCOSE 101* 08/15/2012   CHOL 226* 05/16/2012   TRIG 123.0 05/16/2012   HDL 50.30 05/16/2012   LDLDIRECT 159.1 05/16/2012   LDLCALC 107* 04/04/2009   ALT 17 08/15/2012   AST 17 08/15/2012   NA 140 08/15/2012   K 4.6 08/15/2012   CL 103 08/15/2012   CREATININE 0.8 08/15/2012   BUN 14 08/15/2012   CO2 31 08/15/2012   TSH 1.78 08/15/2012   INR 1.07 09/06/2011   HGBA1C 5.7 08/15/2012      MRI 1/13: IMPRESSION:  1. Severe multifactorial spinal and lateral recess stenosis at L4-  L5 plus moderate right foraminal stenosis.  2. Borderline to mild multifactorial spinal stenosis at T11-T12  and L3-L4.  3. Mild to moderate multifactorial left L5 foraminal stenosis.   Original Report Authenticated By: Harley Hallmark, M.D. Clinical Data: Larey Seat. Hit head.    CT HEAD WITHOUT CONTRAST 12/11 CT CERVICAL SPINE WITHOUT CONTRAST  Technique: Multidetector CT imaging of the head and cervical spine  was performed following the standard protocol without intravenous  contrast. Multiplanar CT image reconstructions of the cervical  spine were also generated.  Comparison: None  CT HEAD  Findings: There is mild age related cerebral atrophy and  ventriculomegaly. No extra-axial fluid collections are seen. No  CT findings for hemispheric infarction or intracranial hemorrhage.  No mass lesions. The brainstem and cerebellum are unremarkable.  The bony calvarium is intact. Benign appearing calvarial lesion   noted in the right frontal area. The paranasal sinuses and mastoid  air cells are clear. The globes are intact.  IMPRESSION:  1. Age related cerebral atrophy and ventriculomegaly.  2. No acute intracranial findings or mass lesions.  3. No skull fracture.  CT CERVICAL SPINE  Findings: Advanced degenerative cervical spondylosis with disc  disease and facet disease most notable at C4-5, C5-6 and C6-7. The  facets are normally aligned. No facet or laminar fractures. No  acute vertebral body fractures or abnormal prevertebral soft tissue  swelling. The C1-2 articulations are maintained. The dens appears  normal.  No large disc protrusions are seen. There is moderate osteophytic  spurring posteriorly at C5-6 and C6-7 with mild mass effect on the  thecal sac. Mild bilateral foraminal stenosis at C5-6 and C6-7.  IMPRESSION:  1. Degenerative cervical spondylosis with disc disease and facet  disease.  2. No acute bony findings and normal alignment.   Provider: Judie Petit      Lab Results  Component Value Date   WBC 4.9 09/06/2011   HGB 14.5 09/06/2011   HCT 45.2 09/06/2011   PLT 186 09/06/2011   GLUCOSE 101* 08/15/2012   CHOL 226* 05/16/2012   TRIG 123.0 05/16/2012   HDL 50.30 05/16/2012   LDLDIRECT 159.1 05/16/2012   LDLCALC 107* 04/04/2009   ALT 17 08/15/2012   AST 17 08/15/2012   NA 140 08/15/2012   K 4.6 08/15/2012   CL 103 08/15/2012   CREATININE 0.8 08/15/2012   BUN 14 08/15/2012   CO2 31 08/15/2012   TSH 1.78 08/15/2012   INR 1.07 09/06/2011   HGBA1C 5.7 08/15/2012        Assessment & Plan:

## 2013-01-02 ENCOUNTER — Other Ambulatory Visit: Payer: Self-pay | Admitting: Internal Medicine

## 2013-01-26 ENCOUNTER — Other Ambulatory Visit: Payer: Self-pay | Admitting: Internal Medicine

## 2013-01-26 NOTE — Telephone Encounter (Signed)
Refill done.  

## 2013-02-14 ENCOUNTER — Ambulatory Visit (INDEPENDENT_AMBULATORY_CARE_PROVIDER_SITE_OTHER): Payer: Medicare Other | Admitting: Internal Medicine

## 2013-02-14 ENCOUNTER — Encounter: Payer: Self-pay | Admitting: Internal Medicine

## 2013-02-14 VITALS — BP 120/70 | HR 83 | Temp 98.5°F | Wt 233.0 lb

## 2013-02-14 DIAGNOSIS — K644 Residual hemorrhoidal skin tags: Secondary | ICD-10-CM | POA: Insufficient documentation

## 2013-02-14 MED ORDER — HYDROCORTISONE 2.5 % RE CREA: TOPICAL_CREAM | RECTAL | Status: DC

## 2013-02-14 MED ORDER — HYDROCORTISONE ACETATE 25 MG RE SUPP: 25.0000 mg | Freq: Two times a day (BID) | RECTAL | Status: DC

## 2013-02-14 NOTE — Assessment & Plan Note (Addendum)
9/14 10 o'clock Anoscopy There was an external 1.0x0.8 cm ruptured hemorrhoid outside at 10-11 o'clock. There was an internal hemorrhoid 0.8x0.6 cm proximal to the first one.  Anusol HC supp and cream

## 2013-02-14 NOTE — Patient Instructions (Addendum)
Sitz bath Take a stool softner

## 2013-02-14 NOTE — Progress Notes (Signed)
Subjective:     HPI   C/o hemorrhoid w/bleeding yesterday. It was painful, bled a lot Less pain today She had a colonoscopy 7 y ago with Dr Loreta Ave    Past Medical History  Diagnosis Date  . Allergy     rhinitis  . Asthma   . Hyperlipidemia   . Hypertension   . Depression   . GERD (gastroesophageal reflux disease)   . Osteoarthritis   . Obesity   . Urinary incontinence   . Shoulder injury     left  . COPD (chronic obstructive pulmonary disease)   . Osteoporosis   . Gallstones   . Peripheral vascular disease     swelling   . Shortness of breath     with exertion   . Pneumonia   . Sleep apnea     never diagnosed   . Chronic kidney disease     hx of cystitis   . Dizziness     vertigo    Past Surgical History  Procedure Laterality Date  . Right left knee arthroplast    . L tkr    . Other surgical history      left leg surgery   . Other surgical history      left breast surgery due to mastitis   . Liver biopsy  09/14/2011    Procedure: LIVER BIOPSY;  Surgeon: Adolph Pollack, MD;  Location: [facility];  Service: General;  Laterality: N/A;  . Cholecystectomy  09/14/2011    Procedure: LAPAROSCOPIC CHOLECYSTECTOMY;  Surgeon: Adolph Pollack, MD;  Location: [facility];  Service: General;  Laterality: N/A;  attempted intraoperative cholangiogram     reports that she quit smoking about 11 years ago. Her smoking use included Cigarettes. She has a 17.5 pack-year smoking history. She has never used smokeless tobacco. She reports that  drinks alcohol. She reports that she does not use illicit drugs. family history includes Arthritis in her mother; Diabetes in her mother; Hypertension in her father, mother, and other; Stroke in her father and mother. Allergies  Allergen Reactions  . Atorvastatin     REACTION: upset stomach  . Enalapril Maleate   . Hctz [Hydrochlorothiazide]     Dizzy   . Lovastatin     REACTION: tongue stress  . Simvastatin     REACTION: mouth sores    Current Outpatient Prescriptions on File Prior to Visit  Medication Sig Dispense Refill  . aspirin 81 MG tablet Take 81 mg by mouth every morning.       . B Complex-C (SUPER B COMPLEX PO) Take 1 tablet by mouth every morning.       . Cholecalciferol 1000 UNITS capsule Take 1,000 Units by mouth every morning.       . clonazePAM (KLONOPIN) 0.25 MG disintegrating tablet Take 1 tablet (0.25 mg total) by mouth 2 (two) times daily as needed (insomnia or anxiety).  60 tablet  1  . FLUoxetine (PROZAC) 10 MG tablet Take 2 tablets (20 mg total) by mouth daily.  60 tablet  5  . furosemide (LASIX) 20 MG tablet Take 1 tablet (20 mg total) by mouth daily as needed (swelling).  30 tablet  5  . loratadine (CLARITIN) 10 MG tablet Take 10 mg by mouth every morning.      . mometasone-formoterol (DULERA) 200-5 MCG/ACT AERO Inhale 2 puffs into the lungs 1 day or 1 dose.  1 Inhaler  11  . naproxen (NAPROSYN) 500 MG tablet TAKE 1 TABLET  TWICE A DAY AS NEEDED FOR PAIN  60 tablet  1  . Omega-3 Fatty Acids (FISH OIL) 1000 MG CAPS Take 1 capsule by mouth every morning.       Marland Kitchen omeprazole (PRILOSEC) 20 MG capsule TAKE 2 CAPSULES BY MOUTH DAILY  60 capsule  3  . potassium chloride (KLOR-CON) 8 MEQ tablet Plotnikov, Aleksei  30 tablet  5  . promethazine-codeine (PHENERGAN WITH CODEINE) 6.25-10 MG/5ML syrup Take 5 mLs by mouth every 4 (four) hours as needed for cough.  300 mL  0  . traMADol (ULTRAM) 50 MG tablet Take 1 tablet (50 mg total) by mouth every 8 (eight) hours as needed for pain.  100 tablet  3  . triamcinolone (KENALOG) 0.1 % paste Place 1 application onto teeth 4 (four) times daily as needed. For pain      . losartan-hydrochlorothiazide (HYZAAR) 100-25 MG per tablet Take 1 tablet by mouth every morning.       No current facility-administered medications on file prior to visit.   BP 120/70  Pulse 83  Temp(Src) 98.5 F (36.9 C) (Oral)  Wt 233 lb (105.688 kg)  BMI 39.97 kg/m2  SpO2 94%   Review of  Systems  Constitutional: Negative for fever, chills, activity change, appetite change, fatigue and unexpected weight change.  HENT: Negative for congestion, mouth sores and sinus pressure.   Eyes: Negative for visual disturbance.  Respiratory: Negative for cough and chest tightness.   Gastrointestinal: Negative for nausea and abdominal pain.  Genitourinary: Negative for frequency, difficulty urinating and vaginal pain.  Musculoskeletal: Positive for gait problem. Negative for myalgias and back pain.  Skin: Negative for pallor and rash.  Neurological: Negative for dizziness, tremors, weakness, numbness and headaches.  Psychiatric/Behavioral: Negative for confusion and sleep disturbance. The patient is nervous/anxious.    Wt Readings from Last 3 Encounters:  02/14/13 233 lb (105.688 kg)  11/20/12 228 lb (103.42 kg)  10/25/12 230 lb (104.327 kg)   BP Readings from Last 3 Encounters:  02/14/13 120/70  11/20/12 130/70  10/25/12 140/80       Objective:   Physical Exam  Constitutional: She appears well-developed. No distress.  HENT:  Head: Normocephalic.  Right Ear: External ear normal.  Left Ear: External ear normal.  Nose: Nose normal.  Mouth/Throat: Oropharynx is clear and moist.  Eyes: Conjunctivae are normal. Pupils are equal, round, and reactive to light. Right eye exhibits no discharge. Left eye exhibits no discharge.  Neck: Normal range of motion. Neck supple. No JVD present. No tracheal deviation present. No thyromegaly present.  Cardiovascular: Normal rate, regular rhythm and normal heart sounds.   Pulmonary/Chest: No stridor. No respiratory distress. She has no wheezes.  Abdominal: Soft. Bowel sounds are normal. She exhibits no distension and no mass. There is no tenderness. There is no rebound and no guarding.  Musculoskeletal: She exhibits no edema and no tenderness.  Lymphadenopathy:    She has no cervical adenopathy.  Neurological: She displays abnormal reflex. No  cranial nerve deficit. She exhibits normal muscle tone. Coordination abnormal.  Skin: No rash noted. No erythema.  Psychiatric: She has a normal mood and affect. Her behavior is normal. Judgment and thought content normal.    Lab Results  Component Value Date   WBC 4.9 09/06/2011   HGB 14.5 09/06/2011   HCT 45.2 09/06/2011   PLT 186 09/06/2011   GLUCOSE 101* 08/15/2012   CHOL 226* 05/16/2012   TRIG 123.0 05/16/2012   HDL 50.30 05/16/2012  LDLDIRECT 159.1 05/16/2012   LDLCALC 107* 04/04/2009   ALT 17 08/15/2012   AST 17 08/15/2012   NA 140 08/15/2012   K 4.6 08/15/2012   CL 103 08/15/2012   CREATININE 0.8 08/15/2012   BUN 14 08/15/2012   CO2 31 08/15/2012   TSH 1.78 08/15/2012   INR 1.07 09/06/2011   HGBA1C 5.7 08/15/2012      MRI 1/13: IMPRESSION:  1. Severe multifactorial spinal and lateral recess stenosis at L4-  L5 plus moderate right foraminal stenosis.  2. Borderline to mild multifactorial spinal stenosis at T11-T12  and L3-L4.  3. Mild to moderate multifactorial left L5 foraminal stenosis.   Original Report Authenticated By: Harley Hallmark, M.D. Clinical Data: Sabrina Page. Hit head.    CT HEAD WITHOUT CONTRAST 12/11 CT CERVICAL SPINE WITHOUT CONTRAST  Technique: Multidetector CT imaging of the head and cervical spine  was performed following the standard protocol without intravenous  contrast. Multiplanar CT image reconstructions of the cervical  spine were also generated.  Comparison: None  CT HEAD  Findings: There is mild age related cerebral atrophy and  ventriculomegaly. No extra-axial fluid collections are seen. No  CT findings for hemispheric infarction or intracranial hemorrhage.  No mass lesions. The brainstem and cerebellum are unremarkable.  The bony calvarium is intact. Benign appearing calvarial lesion  noted in the right frontal area. The paranasal sinuses and mastoid  air cells are clear. The globes are intact.  IMPRESSION:  1. Age related cerebral atrophy  and ventriculomegaly.  2. No acute intracranial findings or mass lesions.  3. No skull fracture.  CT CERVICAL SPINE  Findings: Advanced degenerative cervical spondylosis with disc  disease and facet disease most notable at C4-5, C5-6 and C6-7. The  facets are normally aligned. No facet or laminar fractures. No  acute vertebral body fractures or abnormal prevertebral soft tissue  swelling. The C1-2 articulations are maintained. The dens appears  normal.  No large disc protrusions are seen. There is moderate osteophytic  spurring posteriorly at C5-6 and C6-7 with mild mass effect on the  thecal sac. Mild bilateral foraminal stenosis at C5-6 and C6-7.  IMPRESSION:  1. Degenerative cervical spondylosis with disc disease and facet  disease.  2. No acute bony findings and normal alignment.   Provider: Judie Petit    Procedure: Anoscopy Indication: bleeding Risks and benefits were explained to pt in detail. She was placed in lateral decubitus position. Digital rectal exam was revealed no mass  . Stool was bloody. . Anoscope was introduced without difficulty. No masses. Upon withdrawal normal mucosa was observed. There was an external 1.0x0.8 cm ruptured hemorrhoid outside at 10-11 o'clock. There was an internal hemorrhoid 0.8x0.6 cm proximal to the first one.  Impression: ruptured external hemorrhoid Tolerated well. Complications - none.   Lab Results  Component Value Date   WBC 4.9 09/06/2011   HGB 14.5 09/06/2011   HCT 45.2 09/06/2011   PLT 186 09/06/2011   GLUCOSE 101* 08/15/2012   CHOL 226* 05/16/2012   TRIG 123.0 05/16/2012   HDL 50.30 05/16/2012   LDLDIRECT 159.1 05/16/2012   LDLCALC 107* 04/04/2009   ALT 17 08/15/2012   AST 17 08/15/2012   NA 140 08/15/2012   K 4.6 08/15/2012   CL 103 08/15/2012   CREATININE 0.8 08/15/2012   BUN 14 08/15/2012   CO2 31 08/15/2012   TSH 1.78 08/15/2012   INR 1.07 09/06/2011   HGBA1C 5.7 08/15/2012        Assessment &  Plan:

## 2013-02-21 ENCOUNTER — Ambulatory Visit (INDEPENDENT_AMBULATORY_CARE_PROVIDER_SITE_OTHER): Payer: Medicare Other | Admitting: Internal Medicine

## 2013-02-21 ENCOUNTER — Encounter: Payer: Self-pay | Admitting: Internal Medicine

## 2013-02-21 VITALS — BP 130/70 | HR 76 | Temp 98.1°F | Resp 16 | Wt 232.0 lb

## 2013-02-21 DIAGNOSIS — M79604 Pain in right leg: Secondary | ICD-10-CM

## 2013-02-21 DIAGNOSIS — M79605 Pain in left leg: Secondary | ICD-10-CM

## 2013-02-21 DIAGNOSIS — F329 Major depressive disorder, single episode, unspecified: Secondary | ICD-10-CM | POA: Diagnosis not present

## 2013-02-21 DIAGNOSIS — I10 Essential (primary) hypertension: Secondary | ICD-10-CM | POA: Diagnosis not present

## 2013-02-21 DIAGNOSIS — J45909 Unspecified asthma, uncomplicated: Secondary | ICD-10-CM | POA: Diagnosis not present

## 2013-02-21 DIAGNOSIS — F3289 Other specified depressive episodes: Secondary | ICD-10-CM

## 2013-02-21 DIAGNOSIS — Z23 Encounter for immunization: Secondary | ICD-10-CM | POA: Diagnosis not present

## 2013-02-21 DIAGNOSIS — M545 Low back pain, unspecified: Secondary | ICD-10-CM | POA: Diagnosis not present

## 2013-02-21 DIAGNOSIS — J45901 Unspecified asthma with (acute) exacerbation: Secondary | ICD-10-CM

## 2013-02-21 MED ORDER — TRAMADOL HCL 50 MG PO TABS: 50.0000 mg | ORAL_TABLET | Freq: Three times a day (TID) | ORAL | Status: DC | PRN

## 2013-02-21 MED ORDER — INDAPAMIDE 1.25 MG PO TABS: 1.2500 mg | ORAL_TABLET | ORAL | Status: DC

## 2013-02-21 MED ORDER — CLONAZEPAM 0.25 MG PO TBDP: 0.2500 mg | ORAL_TABLET | Freq: Two times a day (BID) | ORAL | Status: DC | PRN

## 2013-02-21 NOTE — Assessment & Plan Note (Signed)
Continue with current prescription therapy as reflected on the Med list.  

## 2013-02-21 NOTE — Assessment & Plan Note (Signed)
Better  

## 2013-02-21 NOTE — Assessment & Plan Note (Signed)
Loose wt!!!! Wt Readings from Last 3 Encounters:  02/21/13 232 lb (105.235 kg)  02/14/13 233 lb (105.688 kg)  11/20/12 228 lb (103.42 kg)

## 2013-02-21 NOTE — Progress Notes (Signed)
Subjective:     HPI   C/o hemorrhoid w/bleeding - resolved. It was painful, bled a lot Less pain today She had a colonoscopy 7 y ago with Dr Loreta Ave  C/o worse LBP - gained wt   Past Medical History  Diagnosis Date  . Allergy     rhinitis  . Asthma   . Hyperlipidemia   . Hypertension   . Depression   . GERD (gastroesophageal reflux disease)   . Osteoarthritis   . Obesity   . Urinary incontinence   . Shoulder injury     left  . COPD (chronic obstructive pulmonary disease)   . Osteoporosis   . Gallstones   . Peripheral vascular disease     swelling   . Shortness of breath     with exertion   . Pneumonia   . Sleep apnea     never diagnosed   . Chronic kidney disease     hx of cystitis   . Dizziness     vertigo    Past Surgical History  Procedure Laterality Date  . Right left knee arthroplast    . L tkr    . Other surgical history      left leg surgery   . Other surgical history      left breast surgery due to mastitis   . Liver biopsy  09/14/2011    Procedure: LIVER BIOPSY;  Surgeon: Adolph Pollack, MD;  Location: [facility];  Service: General;  Laterality: N/A;  . Cholecystectomy  09/14/2011    Procedure: LAPAROSCOPIC CHOLECYSTECTOMY;  Surgeon: Adolph Pollack, MD;  Location: [facility];  Service: General;  Laterality: N/A;  attempted intraoperative cholangiogram     reports that she quit smoking about 11 years ago. Her smoking use included Cigarettes. She has a 17.5 pack-year smoking history. She has never used smokeless tobacco. She reports that  drinks alcohol. She reports that she does not use illicit drugs. family history includes Arthritis in her mother; Diabetes in her mother; Hypertension in her father, mother, and other; Stroke in her father and mother. Allergies  Allergen Reactions  . Atorvastatin     REACTION: upset stomach  . Enalapril Maleate   . Hctz [Hydrochlorothiazide]     Dizzy   . Lovastatin     REACTION: tongue stress  . Simvastatin    REACTION: mouth sores   Current Outpatient Prescriptions on File Prior to Visit  Medication Sig Dispense Refill  . aspirin 81 MG tablet Take 81 mg by mouth every morning.       . B Complex-C (SUPER B COMPLEX PO) Take 1 tablet by mouth every morning.       . Cholecalciferol 1000 UNITS capsule Take 1,000 Units by mouth every morning.       . clonazePAM (KLONOPIN) 0.25 MG disintegrating tablet Take 1 tablet (0.25 mg total) by mouth 2 (two) times daily as needed (insomnia or anxiety).  60 tablet  1  . FLUoxetine (PROZAC) 10 MG tablet Take 2 tablets (20 mg total) by mouth daily.  60 tablet  5  . furosemide (LASIX) 20 MG tablet Take 1 tablet (20 mg total) by mouth daily as needed (swelling).  30 tablet  5  . hydrocortisone (ANUSOL-HC) 2.5 % rectal cream Use bid perianal  30 g  1  . hydrocortisone (ANUSOL-HC) 25 MG suppository Place 1 suppository (25 mg total) rectally 2 (two) times daily.  20 suppository  1  . loratadine (CLARITIN) 10 MG tablet  Take 10 mg by mouth every morning.      Marland Kitchen losartan-hydrochlorothiazide (HYZAAR) 100-25 MG per tablet Take 1 tablet by mouth every morning.      . mometasone-formoterol (DULERA) 200-5 MCG/ACT AERO Inhale 2 puffs into the lungs 1 day or 1 dose.  1 Inhaler  11  . naproxen (NAPROSYN) 500 MG tablet TAKE 1 TABLET TWICE A DAY AS NEEDED FOR PAIN  60 tablet  1  . Omega-3 Fatty Acids (FISH OIL) 1000 MG CAPS Take 1 capsule by mouth every morning.       Marland Kitchen omeprazole (PRILOSEC) 20 MG capsule TAKE 2 CAPSULES BY MOUTH DAILY  60 capsule  3  . potassium chloride (KLOR-CON) 8 MEQ tablet Plotnikov, Sabrina Page  30 tablet  5  . promethazine-codeine (PHENERGAN WITH CODEINE) 6.25-10 MG/5ML syrup Take 5 mLs by mouth every 4 (four) hours as needed for cough.  300 mL  0  . traMADol (ULTRAM) 50 MG tablet Take 1 tablet (50 mg total) by mouth every 8 (eight) hours as needed for pain.  100 tablet  3  . triamcinolone (KENALOG) 0.1 % paste Place 1 application onto teeth 4 (four) times daily as  needed. For pain       No current facility-administered medications on file prior to visit.   BP 130/70  Pulse 76  Temp(Src) 98.1 F (36.7 C) (Oral)  Resp 16  Wt 232 lb (105.235 kg)  BMI 39.8 kg/m2  SpO2 98%   Review of Systems  Constitutional: Negative for fever, chills, activity change, appetite change, fatigue and unexpected weight change.  HENT: Negative for congestion, mouth sores and sinus pressure.   Eyes: Negative for visual disturbance.  Respiratory: Negative for cough and chest tightness.   Gastrointestinal: Negative for nausea and abdominal pain.  Genitourinary: Negative for frequency, difficulty urinating and vaginal pain.  Musculoskeletal: Positive for gait problem. Negative for myalgias and back pain.  Skin: Negative for pallor and rash.  Neurological: Negative for dizziness, tremors, weakness, numbness and headaches.  Psychiatric/Behavioral: Negative for confusion and sleep disturbance. The patient is nervous/anxious.    Wt Readings from Last 3 Encounters:  02/21/13 232 lb (105.235 kg)  02/14/13 233 lb (105.688 kg)  11/20/12 228 lb (103.42 kg)   BP Readings from Last 3 Encounters:  02/21/13 130/70  02/14/13 120/70  11/20/12 130/70       Objective:   Physical Exam  Constitutional: She appears well-developed. No distress.  HENT:  Head: Normocephalic.  Right Ear: External ear normal.  Left Ear: External ear normal.  Nose: Nose normal.  Mouth/Throat: Oropharynx is clear and moist.  Eyes: Conjunctivae are normal. Pupils are equal, round, and reactive to light. Right eye exhibits no discharge. Left eye exhibits no discharge.  Neck: Normal range of motion. Neck supple. No JVD present. No tracheal deviation present. No thyromegaly present.  Cardiovascular: Normal rate, regular rhythm and normal heart sounds.   Pulmonary/Chest: No stridor. No respiratory distress. She has no wheezes.  Abdominal: Soft. Bowel sounds are normal. She exhibits no distension and no  mass. There is no tenderness. There is no rebound and no guarding.  Musculoskeletal: She exhibits no edema and no tenderness.  Lymphadenopathy:    She has no cervical adenopathy.  Neurological: She displays abnormal reflex. No cranial nerve deficit. She exhibits normal muscle tone. Coordination abnormal.  Skin: No rash noted. No erythema.  Psychiatric: She has a normal mood and affect. Her behavior is normal. Judgment and thought content normal.   LS is  tender  Lab Results  Component Value Date   WBC 4.9 09/06/2011   HGB 14.5 09/06/2011   HCT 45.2 09/06/2011   PLT 186 09/06/2011   GLUCOSE 101* 08/15/2012   CHOL 226* 05/16/2012   TRIG 123.0 05/16/2012   HDL 50.30 05/16/2012   LDLDIRECT 159.1 05/16/2012   LDLCALC 107* 04/04/2009   ALT 17 08/15/2012   AST 17 08/15/2012   NA 140 08/15/2012   K 4.6 08/15/2012   CL 103 08/15/2012   CREATININE 0.8 08/15/2012   BUN 14 08/15/2012   CO2 31 08/15/2012   TSH 1.78 08/15/2012   INR 1.07 09/06/2011   HGBA1C 5.7 08/15/2012      MRI 1/13: IMPRESSION:  1. Severe multifactorial spinal and lateral recess stenosis at L4-  L5 plus moderate right foraminal stenosis.  2. Borderline to mild multifactorial spinal stenosis at T11-T12  and L3-L4.  3. Mild to moderate multifactorial left L5 foraminal stenosis.   Original Report Authenticated By: Harley Hallmark, M.D. Clinical Data: Larey Seat. Hit head.    CT HEAD WITHOUT CONTRAST 12/11 CT CERVICAL SPINE WITHOUT CONTRAST  Technique: Multidetector CT imaging of the head and cervical spine  was performed following the standard protocol without intravenous  contrast. Multiplanar CT image reconstructions of the cervical  spine were also generated.  Comparison: None  CT HEAD  Findings: There is mild age related cerebral atrophy and  ventriculomegaly. No extra-axial fluid collections are seen. No  CT findings for hemispheric infarction or intracranial hemorrhage.  No mass lesions. The brainstem and cerebellum are  unremarkable.  The bony calvarium is intact. Benign appearing calvarial lesion  noted in the right frontal area. The paranasal sinuses and mastoid  air cells are clear. The globes are intact.  IMPRESSION:  1. Age related cerebral atrophy and ventriculomegaly.  2. No acute intracranial findings or mass lesions.  3. No skull fracture.  CT CERVICAL SPINE  Findings: Advanced degenerative cervical spondylosis with disc  disease and facet disease most notable at C4-5, C5-6 and C6-7. The  facets are normally aligned. No facet or laminar fractures. No  acute vertebral body fractures or abnormal prevertebral soft tissue  swelling. The C1-2 articulations are maintained. The dens appears  normal.  No large disc protrusions are seen. There is moderate osteophytic  spurring posteriorly at C5-6 and C6-7 with mild mass effect on the  thecal sac. Mild bilateral foraminal stenosis at C5-6 and C6-7.  IMPRESSION:  1. Degenerative cervical spondylosis with disc disease and facet  disease.  2. No acute bony findings and normal alignment.   Provider: Judie Petit   Lab Results  Component Value Date   WBC 4.9 09/06/2011   HGB 14.5 09/06/2011   HCT 45.2 09/06/2011   PLT 186 09/06/2011   GLUCOSE 101* 08/15/2012   CHOL 226* 05/16/2012   TRIG 123.0 05/16/2012   HDL 50.30 05/16/2012   LDLDIRECT 159.1 05/16/2012   LDLCALC 107* 04/04/2009   ALT 17 08/15/2012   AST 17 08/15/2012   NA 140 08/15/2012   K 4.6 08/15/2012   CL 103 08/15/2012   CREATININE 0.8 08/15/2012   BUN 14 08/15/2012   CO2 31 08/15/2012   TSH 1.78 08/15/2012   INR 1.07 09/06/2011   HGBA1C 5.7 08/15/2012        Assessment & Plan:

## 2013-03-06 ENCOUNTER — Other Ambulatory Visit: Payer: Self-pay

## 2013-03-06 DIAGNOSIS — Z1231 Encounter for screening mammogram for malignant neoplasm of breast: Secondary | ICD-10-CM

## 2013-04-05 ENCOUNTER — Ambulatory Visit
Admission: RE | Admit: 2013-04-05 | Discharge: 2013-04-05 | Disposition: A | Discharge status: Home / Self Care | Payer: Medicare Other | Source: Ambulatory Visit

## 2013-04-05 DIAGNOSIS — Z1231 Encounter for screening mammogram for malignant neoplasm of breast: Secondary | ICD-10-CM

## 2013-04-16 ENCOUNTER — Other Ambulatory Visit: Payer: Self-pay | Admitting: Internal Medicine

## 2013-04-23 ENCOUNTER — Other Ambulatory Visit (INDEPENDENT_AMBULATORY_CARE_PROVIDER_SITE_OTHER): Payer: Medicare Other

## 2013-04-23 ENCOUNTER — Ambulatory Visit (INDEPENDENT_AMBULATORY_CARE_PROVIDER_SITE_OTHER): Payer: Medicare Other | Admitting: Internal Medicine

## 2013-04-23 ENCOUNTER — Encounter: Payer: Self-pay | Admitting: Internal Medicine

## 2013-04-23 VITALS — BP 120/80 | HR 80 | Temp 98.4°F | Resp 16 | Wt 226.0 lb

## 2013-04-23 DIAGNOSIS — R739 Hyperglycemia, unspecified: Secondary | ICD-10-CM

## 2013-04-23 DIAGNOSIS — I10 Essential (primary) hypertension: Secondary | ICD-10-CM

## 2013-04-23 DIAGNOSIS — R5383 Other fatigue: Secondary | ICD-10-CM

## 2013-04-23 DIAGNOSIS — G4700 Insomnia, unspecified: Secondary | ICD-10-CM | POA: Diagnosis not present

## 2013-04-23 DIAGNOSIS — K219 Gastro-esophageal reflux disease without esophagitis: Secondary | ICD-10-CM

## 2013-04-23 DIAGNOSIS — J45909 Unspecified asthma, uncomplicated: Secondary | ICD-10-CM | POA: Diagnosis not present

## 2013-04-23 DIAGNOSIS — F411 Generalized anxiety disorder: Secondary | ICD-10-CM

## 2013-04-23 DIAGNOSIS — R5381 Other malaise: Secondary | ICD-10-CM

## 2013-04-23 DIAGNOSIS — F329 Major depressive disorder, single episode, unspecified: Secondary | ICD-10-CM

## 2013-04-23 DIAGNOSIS — R7309 Other abnormal glucose: Secondary | ICD-10-CM

## 2013-04-23 DIAGNOSIS — F3289 Other specified depressive episodes: Secondary | ICD-10-CM

## 2013-04-23 LAB — BASIC METABOLIC PANEL
BUN: 15 mg/dL (ref 6–23)
CO2: 34 mEq/L — ABNORMAL HIGH (ref 19–32)
Calcium: 9.2 mg/dL (ref 8.4–10.5)
Chloride: 96 mEq/L (ref 96–112)
Creatinine, Ser: 1.1 mg/dL (ref 0.4–1.2)
GFR: 53.93 mL/min — ABNORMAL LOW (ref >60.00)
Glucose, Bld: 87 mg/dL (ref 70–99)
Potassium: 3.9 mEq/L (ref 3.5–5.1)
Sodium: 138 mEq/L (ref 135–145)

## 2013-04-23 LAB — HEMOGLOBIN A1C: Hgb A1c MFr Bld: 6 % (ref 4.6–6.5)

## 2013-04-23 MED ORDER — CLONAZEPAM 0.25 MG PO TBDP: 0.2500 mg | ORAL_TABLET | Freq: Two times a day (BID) | ORAL | Status: DC | PRN

## 2013-04-23 NOTE — Assessment & Plan Note (Signed)
Continue with current prescription therapy as reflected on the Med list.  

## 2013-04-23 NOTE — Assessment & Plan Note (Signed)
Continue with current prescription prn therapy as reflected on the Med list.  

## 2013-04-23 NOTE — Progress Notes (Signed)
Pre visit review using our clinic review tool, if applicable. No additional management support is needed unless otherwise documented below in the visit note. 

## 2013-04-23 NOTE — Patient Instructions (Addendum)
Loose weight!!! Re-start Fluoxetine  Wt Readings from Last 3 Encounters:  04/23/13 226 lb (102.513 kg)  02/21/13 232 lb (105.235 kg)  02/14/13 233 lb (105.688 kg)

## 2013-04-23 NOTE — Progress Notes (Signed)
Subjective:     HPI   F/u hemorrhoid w/bleeding - resolved. It was painful, bled a lot Less pain today She had a colonoscopy 7 y ago with Dr Loreta Ave  C/o worse LBP - better; gained wt   Past Medical History  Diagnosis Date  . Allergy     rhinitis  . Asthma   . Hyperlipidemia   . Hypertension   . Depression   . GERD (gastroesophageal reflux disease)   . Osteoarthritis   . Obesity   . Urinary incontinence   . Shoulder injury     left  . COPD (chronic obstructive pulmonary disease)   . Osteoporosis   . Gallstones   . Peripheral vascular disease     swelling   . Shortness of breath     with exertion   . Pneumonia   . Sleep apnea     never diagnosed   . Chronic kidney disease     hx of cystitis   . Dizziness     vertigo    Past Surgical History  Procedure Laterality Date  . Right left knee arthroplast    . L tkr    . Other surgical history      left leg surgery   . Other surgical history      left breast surgery due to mastitis   . Liver biopsy  09/14/2011    Procedure: LIVER BIOPSY;  Surgeon: Adolph Pollack, MD;  Location: [facility];  Service: General;  Laterality: N/A;  . Cholecystectomy  09/14/2011    Procedure: LAPAROSCOPIC CHOLECYSTECTOMY;  Surgeon: Adolph Pollack, MD;  Location: [facility];  Service: General;  Laterality: N/A;  attempted intraoperative cholangiogram     reports that she quit smoking about 11 years ago. Her smoking use included Cigarettes. She has a 17.5 pack-year smoking history. She has never used smokeless tobacco. She reports that she drinks alcohol. She reports that she does not use illicit drugs. family history includes Arthritis in her mother; Diabetes in her mother; Hypertension in her father, mother, and other; Stroke in her father and mother. Allergies  Allergen Reactions  . Atorvastatin     REACTION: upset stomach  . Enalapril Maleate   . Hctz [Hydrochlorothiazide]     Dizzy   . Lovastatin     REACTION: tongue stress  .  Simvastatin     REACTION: mouth sores   Current Outpatient Prescriptions on File Prior to Visit  Medication Sig Dispense Refill  . aspirin 81 MG tablet Take 81 mg by mouth every morning.       . B Complex-C (SUPER B COMPLEX PO) Take 1 tablet by mouth every morning.       . Cholecalciferol 1000 UNITS capsule Take 1,000 Units by mouth every morning.       . clonazePAM (KLONOPIN) 0.25 MG disintegrating tablet Take 1 tablet (0.25 mg total) by mouth 2 (two) times daily as needed (insomnia or anxiety).  60 tablet  1  . FLUoxetine (PROZAC) 10 MG tablet TAKE 2 TABLETS BY MOUTH DAILY  60 tablet  4  . hydrocortisone (ANUSOL-HC) 2.5 % rectal cream Use bid perianal  30 g  1  . hydrocortisone (ANUSOL-HC) 25 MG suppository Place 1 suppository (25 mg total) rectally 2 (two) times daily.  20 suppository  1  . indapamide (LOZOL) 1.25 MG tablet Take 1 tablet (1.25 mg total) by mouth every morning. For swelling  30 tablet  5  . loratadine (CLARITIN) 10 MG tablet  Take 10 mg by mouth every morning.      . mometasone-formoterol (DULERA) 200-5 MCG/ACT AERO Inhale 2 puffs into the lungs 1 day or 1 dose.  1 Inhaler  11  . naproxen (NAPROSYN) 500 MG tablet TAKE 1 TABLET TWICE A DAY AS NEEDED FOR PAIN  60 tablet  1  . Omega-3 Fatty Acids (FISH OIL) 1000 MG CAPS Take 1 capsule by mouth every morning.       Marland Kitchen omeprazole (PRILOSEC) 20 MG capsule TAKE 2 CAPSULES BY MOUTH DAILY  60 capsule  3  . potassium chloride (KLOR-CON) 8 MEQ tablet Plotnikov, Aleksei  30 tablet  5  . traMADol (ULTRAM) 50 MG tablet Take 1 tablet (50 mg total) by mouth every 8 (eight) hours as needed for pain.  100 tablet  3  . triamcinolone (KENALOG) 0.1 % paste Place 1 application onto teeth 4 (four) times daily as needed. For pain      . furosemide (LASIX) 20 MG tablet Take 1 tablet (20 mg total) by mouth daily as needed (swelling).  30 tablet  5  . losartan-hydrochlorothiazide (HYZAAR) 100-25 MG per tablet Take 1 tablet by mouth every morning.        No current facility-administered medications on file prior to visit.   BP 120/80  Pulse 80  Temp(Src) 98.4 F (36.9 C) (Oral)  Resp 16  Wt 226 lb (102.513 kg)   Review of Systems  Constitutional: Negative for fever, chills, activity change, appetite change, fatigue and unexpected weight change.  HENT: Negative for congestion, mouth sores and sinus pressure.   Eyes: Negative for visual disturbance.  Respiratory: Negative for cough and chest tightness.   Gastrointestinal: Negative for nausea and abdominal pain.  Genitourinary: Negative for frequency, difficulty urinating and vaginal pain.  Musculoskeletal: Positive for gait problem. Negative for back pain and myalgias.  Skin: Negative for pallor and rash.  Neurological: Negative for dizziness, tremors, weakness, numbness and headaches.  Psychiatric/Behavioral: Negative for confusion and sleep disturbance. The patient is nervous/anxious.    Wt Readings from Last 3 Encounters:  04/23/13 226 lb (102.513 kg)  02/21/13 232 lb (105.235 kg)  02/14/13 233 lb (105.688 kg)   BP Readings from Last 3 Encounters:  04/23/13 120/80  02/21/13 130/70  02/14/13 120/70       Objective:   Physical Exam  Constitutional: She appears well-developed. No distress.  HENT:  Head: Normocephalic.  Right Ear: External ear normal.  Left Ear: External ear normal.  Nose: Nose normal.  Mouth/Throat: Oropharynx is clear and moist.  Eyes: Conjunctivae are normal. Pupils are equal, round, and reactive to light. Right eye exhibits no discharge. Left eye exhibits no discharge.  Neck: Normal range of motion. Neck supple. No JVD present. No tracheal deviation present. No thyromegaly present.  Cardiovascular: Normal rate, regular rhythm and normal heart sounds.   Pulmonary/Chest: No stridor. No respiratory distress. She has no wheezes.  Abdominal: Soft. Bowel sounds are normal. She exhibits no distension and no mass. There is no tenderness. There is no  rebound and no guarding.  Musculoskeletal: She exhibits no edema and no tenderness.  Lymphadenopathy:    She has no cervical adenopathy.  Neurological: She displays abnormal reflex. No cranial nerve deficit. She exhibits normal muscle tone. Coordination abnormal.  Skin: No rash noted. No erythema.  Psychiatric: She has a normal mood and affect. Her behavior is normal. Judgment and thought content normal.   LS is less tender  Lab Results  Component Value Date  WBC 4.9 09/06/2011   HGB 14.5 09/06/2011   HCT 45.2 09/06/2011   PLT 186 09/06/2011   GLUCOSE 101* 08/15/2012   CHOL 226* 05/16/2012   TRIG 123.0 05/16/2012   HDL 50.30 05/16/2012   LDLDIRECT 159.1 05/16/2012   LDLCALC 107* 04/04/2009   ALT 17 08/15/2012   AST 17 08/15/2012   NA 140 08/15/2012   K 4.6 08/15/2012   CL 103 08/15/2012   CREATININE 0.8 08/15/2012   BUN 14 08/15/2012   CO2 31 08/15/2012   TSH 1.78 08/15/2012   INR 1.07 09/06/2011   HGBA1C 5.7 08/15/2012      MRI 1/13: IMPRESSION:  1. Severe multifactorial spinal and lateral recess stenosis at L4-  L5 plus moderate right foraminal stenosis.  2. Borderline to mild multifactorial spinal stenosis at T11-T12  and L3-L4.  3. Mild to moderate multifactorial left L5 foraminal stenosis.   Original Report Authenticated By: Harley Hallmark, M.D. Clinical Data: Sabrina Page. Hit head.    CT HEAD WITHOUT CONTRAST 12/11 CT CERVICAL SPINE WITHOUT CONTRAST  Technique: Multidetector CT imaging of the head and cervical spine  was performed following the standard protocol without intravenous  contrast. Multiplanar CT image reconstructions of the cervical  spine were also generated.  Comparison: None  CT HEAD  Findings: There is mild age related cerebral atrophy and  ventriculomegaly. No extra-axial fluid collections are seen. No  CT findings for hemispheric infarction or intracranial hemorrhage.  No mass lesions. The brainstem and cerebellum are unremarkable.  The bony calvarium is  intact. Benign appearing calvarial lesion  noted in the right frontal area. The paranasal sinuses and mastoid  air cells are clear. The globes are intact.  IMPRESSION:  1. Age related cerebral atrophy and ventriculomegaly.  2. No acute intracranial findings or mass lesions.  3. No skull fracture.  CT CERVICAL SPINE  Findings: Advanced degenerative cervical spondylosis with disc  disease and facet disease most notable at C4-5, C5-6 and C6-7. The  facets are normally aligned. No facet or laminar fractures. No  acute vertebral body fractures or abnormal prevertebral soft tissue  swelling. The C1-2 articulations are maintained. The dens appears  normal.  No large disc protrusions are seen. There is moderate osteophytic  spurring posteriorly at C5-6 and C6-7 with mild mass effect on the  thecal sac. Mild bilateral foraminal stenosis at C5-6 and C6-7.  IMPRESSION:  1. Degenerative cervical spondylosis with disc disease and facet  disease.  2. No acute bony findings and normal alignment.   Provider: Judie Petit   Lab Results  Component Value Date   WBC 4.9 09/06/2011   HGB 14.5 09/06/2011   HCT 45.2 09/06/2011   PLT 186 09/06/2011   GLUCOSE 101* 08/15/2012   CHOL 226* 05/16/2012   TRIG 123.0 05/16/2012   HDL 50.30 05/16/2012   LDLDIRECT 159.1 05/16/2012   LDLCALC 107* 04/04/2009   ALT 17 08/15/2012   AST 17 08/15/2012   NA 140 08/15/2012   K 4.6 08/15/2012   CL 103 08/15/2012   CREATININE 0.8 08/15/2012   BUN 14 08/15/2012   CO2 31 08/15/2012   TSH 1.78 08/15/2012   INR 1.07 09/06/2011   HGBA1C 5.7 08/15/2012        Assessment & Plan:

## 2013-05-01 NOTE — Assessment & Plan Note (Signed)
Wt loss need was discussed 

## 2013-05-01 NOTE — Assessment & Plan Note (Signed)
Continue with current prescription therapy as reflected on the Med list.  

## 2013-06-25 ENCOUNTER — Other Ambulatory Visit (INDEPENDENT_AMBULATORY_CARE_PROVIDER_SITE_OTHER): Payer: Medicare Other

## 2013-06-25 ENCOUNTER — Ambulatory Visit (INDEPENDENT_AMBULATORY_CARE_PROVIDER_SITE_OTHER): Payer: Medicare Other | Admitting: Internal Medicine

## 2013-06-25 ENCOUNTER — Encounter: Payer: Self-pay | Admitting: Internal Medicine

## 2013-06-25 VITALS — BP 130/90 | HR 80 | Temp 98.1°F | Resp 16 | Wt 226.0 lb

## 2013-06-25 DIAGNOSIS — R0683 Snoring: Secondary | ICD-10-CM

## 2013-06-25 DIAGNOSIS — J45909 Unspecified asthma, uncomplicated: Secondary | ICD-10-CM

## 2013-06-25 DIAGNOSIS — I10 Essential (primary) hypertension: Secondary | ICD-10-CM

## 2013-06-25 DIAGNOSIS — R5383 Other fatigue: Secondary | ICD-10-CM

## 2013-06-25 DIAGNOSIS — R7309 Other abnormal glucose: Secondary | ICD-10-CM

## 2013-06-25 DIAGNOSIS — F329 Major depressive disorder, single episode, unspecified: Secondary | ICD-10-CM | POA: Diagnosis not present

## 2013-06-25 DIAGNOSIS — R5381 Other malaise: Secondary | ICD-10-CM

## 2013-06-25 DIAGNOSIS — R739 Hyperglycemia, unspecified: Secondary | ICD-10-CM

## 2013-06-25 DIAGNOSIS — F3289 Other specified depressive episodes: Secondary | ICD-10-CM

## 2013-06-25 DIAGNOSIS — F411 Generalized anxiety disorder: Secondary | ICD-10-CM

## 2013-06-25 DIAGNOSIS — R0989 Other specified symptoms and signs involving the circulatory and respiratory systems: Secondary | ICD-10-CM

## 2013-06-25 DIAGNOSIS — R5382 Chronic fatigue, unspecified: Secondary | ICD-10-CM

## 2013-06-25 DIAGNOSIS — R0609 Other forms of dyspnea: Secondary | ICD-10-CM

## 2013-06-25 LAB — BASIC METABOLIC PANEL
BUN: 15 mg/dL (ref 6–23)
CO2: 32 mEq/L (ref 19–32)
Calcium: 9 mg/dL (ref 8.4–10.5)
Chloride: 97 mEq/L (ref 96–112)
Creatinine, Ser: 1 mg/dL (ref 0.4–1.2)
GFR: 55.74 mL/min — ABNORMAL LOW (ref >60.00)
Glucose, Bld: 89 mg/dL (ref 70–99)
Potassium: 4.1 mEq/L (ref 3.5–5.1)
Sodium: 137 mEq/L (ref 135–145)

## 2013-06-25 LAB — TSH: TSH: 1.62 u[IU]/mL (ref 0.35–5.50)

## 2013-06-25 LAB — HEMOGLOBIN A1C: Hgb A1c MFr Bld: 5.8 % (ref 4.6–6.5)

## 2013-06-25 MED ORDER — MOMETASONE FURO-FORMOTEROL FUM 200-5 MCG/ACT IN AERO: 2.0000 | INHALATION_SPRAY | RESPIRATORY_TRACT | Status: DC

## 2013-06-25 NOTE — Assessment & Plan Note (Signed)
Continue with current prescription therapy as reflected on the Med list.  

## 2013-06-25 NOTE — Progress Notes (Signed)
Subjective:     HPI  C/o SOB at night, ?snoring; tired in am No CP F/u hemorrhoid w/bleeding - resolved. It was painful, bled a lot Less pain today She had a colonoscopy 7-8 y ago with Dr Collene Mares  C/o worse LBP - better; gained wt   Past Medical History  Diagnosis Date  . Allergy     rhinitis  . Asthma   . Hyperlipidemia   . Hypertension   . Depression   . GERD (gastroesophageal reflux disease)   . Osteoarthritis   . Obesity   . Urinary incontinence   . Shoulder injury     left  . COPD (chronic obstructive pulmonary disease)   . Osteoporosis   . Gallstones   . Peripheral vascular disease     swelling   . Shortness of breath     with exertion   . Pneumonia   . Sleep apnea     never diagnosed   . Chronic kidney disease     hx of cystitis   . Dizziness     vertigo    Past Surgical History  Procedure Laterality Date  . Right left knee arthroplast    . L tkr    . Other surgical history      left leg surgery   . Other surgical history      left breast surgery due to mastitis   . Liver biopsy  09/14/2011    Procedure: LIVER BIOPSY;  Surgeon: Odis Hollingshead, MD;  Location: [facility];  Service: General;  Laterality: N/A;  . Cholecystectomy  09/14/2011    Procedure: LAPAROSCOPIC CHOLECYSTECTOMY;  Surgeon: Odis Hollingshead, MD;  Location: [facility];  Service: General;  Laterality: N/A;  attempted intraoperative cholangiogram     reports that she quit smoking about 12 years ago. Her smoking use included Cigarettes. She has a 17.5 pack-year smoking history. She has never used smokeless tobacco. She reports that she drinks alcohol. She reports that she does not use illicit drugs. family history includes Arthritis in her mother; Diabetes in her mother; Hypertension in her father, mother, and other; Stroke in her father and mother. Allergies  Allergen Reactions  . Atorvastatin     REACTION: upset stomach  . Enalapril Maleate   . Hctz [Hydrochlorothiazide]     Dizzy   .  Lovastatin     REACTION: tongue stress  . Simvastatin     REACTION: mouth sores   Current Outpatient Prescriptions on File Prior to Visit  Medication Sig Dispense Refill  . aspirin 81 MG tablet Take 81 mg by mouth every morning.       . B Complex-C (SUPER B COMPLEX PO) Take 1 tablet by mouth every morning.       . Cholecalciferol 1000 UNITS capsule Take 1,000 Units by mouth every morning.       . clonazePAM (KLONOPIN) 0.25 MG disintegrating tablet Take 1 tablet (0.25 mg total) by mouth 2 (two) times daily as needed (insomnia or anxiety). Insomnia  60 tablet  2  . FLUoxetine (PROZAC) 10 MG tablet TAKE 2 TABLETS BY MOUTH DAILY  60 tablet  4  . hydrocortisone (ANUSOL-HC) 2.5 % rectal cream Use bid perianal  30 g  1  . hydrocortisone (ANUSOL-HC) 25 MG suppository Place 1 suppository (25 mg total) rectally 2 (two) times daily.  20 suppository  1  . indapamide (LOZOL) 1.25 MG tablet Take 1 tablet (1.25 mg total) by mouth every morning. For swelling  30  tablet  5  . loratadine (CLARITIN) 10 MG tablet Take 10 mg by mouth every morning.      . mometasone-formoterol (DULERA) 200-5 MCG/ACT AERO Inhale 2 puffs into the lungs 1 day or 1 dose.  1 Inhaler  11  . Omega-3 Fatty Acids (FISH OIL) 1000 MG CAPS Take 1 capsule by mouth every morning.       Marland Kitchen omeprazole (PRILOSEC) 20 MG capsule TAKE 2 CAPSULES BY MOUTH DAILY  60 capsule  3  . potassium chloride (KLOR-CON) 8 MEQ tablet Plotnikov, Aleksei  30 tablet  5  . traMADol (ULTRAM) 50 MG tablet Take 1 tablet (50 mg total) by mouth every 8 (eight) hours as needed for pain.  100 tablet  3  . triamcinolone (KENALOG) 0.1 % paste Place 1 application onto teeth 4 (four) times daily as needed. For pain      . furosemide (LASIX) 20 MG tablet Take 1 tablet (20 mg total) by mouth daily as needed (swelling).  30 tablet  5  . losartan-hydrochlorothiazide (HYZAAR) 100-25 MG per tablet Take 1 tablet by mouth every morning.       No current facility-administered  medications on file prior to visit.   BP 130/90  Pulse 80  Temp(Src) 98.1 F (36.7 C) (Oral)  Resp 16  Wt 226 lb (102.513 kg)   Review of Systems  Constitutional: Negative for fever, chills, activity change, appetite change, fatigue and unexpected weight change.  HENT: Negative for congestion, mouth sores and sinus pressure.   Eyes: Negative for visual disturbance.  Respiratory: Negative for cough and chest tightness.   Gastrointestinal: Negative for nausea and abdominal pain.  Genitourinary: Negative for frequency, difficulty urinating and vaginal pain.  Musculoskeletal: Positive for gait problem. Negative for back pain and myalgias.  Skin: Negative for pallor and rash.  Neurological: Negative for dizziness, tremors, weakness, numbness and headaches.  Psychiatric/Behavioral: Negative for confusion and sleep disturbance. The patient is nervous/anxious.    Wt Readings from Last 3 Encounters:  06/25/13 226 lb (102.513 kg)  04/23/13 226 lb (102.513 kg)  02/21/13 232 lb (105.235 kg)   BP Readings from Last 3 Encounters:  06/25/13 130/90  04/23/13 120/80  02/21/13 130/70       Objective:   Physical Exam  Constitutional: She appears well-developed. No distress.  HENT:  Head: Normocephalic.  Right Ear: External ear normal.  Left Ear: External ear normal.  Nose: Nose normal.  Mouth/Throat: Oropharynx is clear and moist.  Eyes: Conjunctivae are normal. Pupils are equal, round, and reactive to light. Right eye exhibits no discharge. Left eye exhibits no discharge.  Neck: Normal range of motion. Neck supple. No JVD present. No tracheal deviation present. No thyromegaly present.  Cardiovascular: Normal rate, regular rhythm and normal heart sounds.   Pulmonary/Chest: No stridor. No respiratory distress. She has no wheezes.  Abdominal: Soft. Bowel sounds are normal. She exhibits no distension and no mass. There is no tenderness. There is no rebound and no guarding.  Musculoskeletal:  She exhibits no edema and no tenderness.  Lymphadenopathy:    She has no cervical adenopathy.  Neurological: She displays abnormal reflex. No cranial nerve deficit. She exhibits normal muscle tone. Coordination abnormal.  Skin: No rash noted. No erythema.  Psychiatric: She has a normal mood and affect. Her behavior is normal. Judgment and thought content normal.   LS is less tender  Lab Results  Component Value Date   WBC 4.9 09/06/2011   HGB 14.5 09/06/2011   HCT  45.2 09/06/2011   PLT 186 09/06/2011   GLUCOSE 87 04/23/2013   CHOL 226* 05/16/2012   TRIG 123.0 05/16/2012   HDL 50.30 05/16/2012   LDLDIRECT 159.1 05/16/2012   LDLCALC 107* 04/04/2009   ALT 17 08/15/2012   AST 17 08/15/2012   NA 138 04/23/2013   K 3.9 04/23/2013   CL 96 04/23/2013   CREATININE 1.1 04/23/2013   BUN 15 04/23/2013   CO2 34* 04/23/2013   TSH 1.78 08/15/2012   INR 1.07 09/06/2011   HGBA1C 6.0 04/23/2013      MRI 1/13: IMPRESSION:  1. Severe multifactorial spinal and lateral recess stenosis at L4-  L5 plus moderate right foraminal stenosis.  2. Borderline to mild multifactorial spinal stenosis at T11-T12  and L3-L4.  3. Mild to moderate multifactorial left L5 foraminal stenosis.   Original Report Authenticated By: Randall An, M.D. Clinical Data: [place]. Hit head.    CT HEAD WITHOUT CONTRAST 12/11 CT CERVICAL SPINE WITHOUT CONTRAST  Technique: Multidetector CT imaging of the head and cervical spine  was performed following the standard protocol without intravenous  contrast. Multiplanar CT image reconstructions of the cervical  spine were also generated.  Comparison: None  CT HEAD  Findings: There is mild age related cerebral atrophy and  ventriculomegaly. No extra-axial fluid collections are seen. No  CT findings for hemispheric infarction or intracranial hemorrhage.  No mass lesions. The brainstem and cerebellum are unremarkable.  The bony calvarium is intact. Benign appearing calvarial lesion   noted in the right frontal area. The paranasal sinuses and mastoid  air cells are clear. The globes are intact.  IMPRESSION:  1. Age related cerebral atrophy and ventriculomegaly.  2. No acute intracranial findings or mass lesions.  3. No skull fracture.  CT CERVICAL SPINE  Findings: Advanced degenerative cervical spondylosis with disc  disease and facet disease most notable at C4-5, C5-6 and C6-7. The  facets are normally aligned. No facet or laminar fractures. No  acute vertebral body fractures or abnormal prevertebral soft tissue  swelling. The C1-2 articulations are maintained. The dens appears  normal.  No large disc protrusions are seen. There is moderate osteophytic  spurring posteriorly at C5-6 and C6-7 with mild mass effect on the  thecal sac. Mild bilateral foraminal stenosis at C5-6 and C6-7.  IMPRESSION:  1. Degenerative cervical spondylosis with disc disease and facet  disease.  2. No acute bony findings and normal alignment.   Provider: Durene Fruits   Lab Results  Component Value Date   WBC 4.9 09/06/2011   HGB 14.5 09/06/2011   HCT 45.2 09/06/2011   PLT 186 09/06/2011   GLUCOSE 87 04/23/2013   CHOL 226* 05/16/2012   TRIG 123.0 05/16/2012   HDL 50.30 05/16/2012   LDLDIRECT 159.1 05/16/2012   LDLCALC 107* 04/04/2009   ALT 17 08/15/2012   AST 17 08/15/2012   NA 138 04/23/2013   K 3.9 04/23/2013   CL 96 04/23/2013   CREATININE 1.1 04/23/2013   BUN 15 04/23/2013   CO2 34* 04/23/2013   TSH 1.78 08/15/2012   INR 1.07 09/06/2011   HGBA1C 6.0 04/23/2013        Assessment & Plan:

## 2013-06-25 NOTE — Assessment & Plan Note (Signed)
Multifactorial  Loose wt Labs R/o OSA Re-start asthma Rx that she stopped Risks associated with treatment and diet noncompliance were discussed. Compliance was encouraged.

## 2013-06-25 NOTE — Progress Notes (Signed)
Pre visit review using our clinic review tool, if applicable. No additional management support is needed unless otherwise documented below in the visit note. 

## 2013-06-25 NOTE — Assessment & Plan Note (Signed)
A1c

## 2013-06-25 NOTE — Assessment & Plan Note (Signed)
R/o OSA, hypoxemia at night

## 2013-06-28 ENCOUNTER — Telehealth: Payer: Self-pay

## 2013-06-28 NOTE — Telephone Encounter (Signed)
Will discuss at the next visit Thx

## 2013-06-28 NOTE — Telephone Encounter (Signed)
The patient called and is hoping to get an order/referral for a colonoscopy  

## 2013-07-02 NOTE — Telephone Encounter (Signed)
Pt's husband informed.

## 2013-07-06 ENCOUNTER — Encounter: Payer: Self-pay | Admitting: Cardiovascular Disease

## 2013-07-06 ENCOUNTER — Ambulatory Visit (INDEPENDENT_AMBULATORY_CARE_PROVIDER_SITE_OTHER): Payer: Medicare Other | Admitting: Cardiovascular Disease

## 2013-07-06 VITALS — BP 131/85 | HR 80 | Ht 64.0 in | Wt 223.8 lb

## 2013-07-06 DIAGNOSIS — R0609 Other forms of dyspnea: Secondary | ICD-10-CM

## 2013-07-06 DIAGNOSIS — R0989 Other specified symptoms and signs involving the circulatory and respiratory systems: Secondary | ICD-10-CM

## 2013-07-06 DIAGNOSIS — R002 Palpitations: Secondary | ICD-10-CM

## 2013-07-06 DIAGNOSIS — R0600 Dyspnea, unspecified: Secondary | ICD-10-CM

## 2013-07-06 DIAGNOSIS — R5381 Other malaise: Secondary | ICD-10-CM

## 2013-07-06 DIAGNOSIS — R5383 Other fatigue: Secondary | ICD-10-CM

## 2013-07-06 DIAGNOSIS — R079 Chest pain, unspecified: Secondary | ICD-10-CM

## 2013-07-06 NOTE — Patient Instructions (Signed)
Your physician recommends that you schedule a follow-up appointment in:  4-6 weeks.   Your physician has requested that you have a lexiscan myoview. For further information please visit [email or web address]. Please follow instruction sheet, as given.   Your physician has requested that you have an echocardiogram. Echocardiography is a painless test that uses sound waves to create images of your heart. It provides your doctor with information about the size and shape of your heart and how well your heart's chambers and valves are working. This procedure takes approximately one hour. There are no restrictions for this procedure.   Your physician has recommended that you wear a holter monitor. Holter monitors are medical devices that record the heart's electrical activity. Doctors most often use these monitors to diagnose arrhythmias. Arrhythmias are problems with the speed or rhythm of the heartbeat. The monitor is a small, portable device. You can wear one while you do your normal daily activities. This is usually used to diagnose what is causing palpitations/syncope (passing out).

## 2013-07-06 NOTE — Progress Notes (Signed)
History of Present Illness: 80 yo female with history of HTN, HLD, asthma, COPD, probable OSA, GERD here today as a new patient for evaluation of SOB, dizziness, weakness,  chest pain, palpitations. Her blood pressure has been erratic. She feels that her heart races at times. This occurs mostly at rest, two times per week. No irregularity. She has central chest pains/heaviness several times per week. Also dypnea with minimal exertion. Normal stress myoview 04/21/09. Remote cardiac cath reported as normal by patient. She is here today with an interpretor. She only speaks [place].   Primary Care Physician:  Plotnikov  Last Lipid Profile:Lipid Panel     Component Value Date/Time   CHOL 226* 05/16/2012 0909   TRIG 123.0 05/16/2012 0909   HDL 50.30 05/16/2012 0909   CHOLHDL 4 05/16/2012 0909   VLDL 24.6 05/16/2012 0909   LDLCALC 107* 04/04/2009 0832     Past Medical History  Diagnosis Date  . Allergy     rhinitis  . Asthma   . Hyperlipidemia   . Hypertension   . Depression   . GERD (gastroesophageal reflux disease)   . Osteoarthritis   . Obesity   . Urinary incontinence   . Shoulder injury     left  . COPD (chronic obstructive pulmonary disease)   . Osteoporosis   . Gallstones   . Peripheral vascular disease     swelling   . Shortness of breath     with exertion   . Pneumonia   . Sleep apnea     never diagnosed   . Chronic kidney disease     hx of cystitis   . Dizziness     vertigo     Past Surgical History  Procedure Laterality Date  . Right left knee arthroplast    . L tkr    . Other surgical history      left leg surgery   . Other surgical history      left breast surgery due to mastitis   . Liver biopsy  09/14/2011    Procedure: LIVER BIOPSY;  Surgeon: Odis Hollingshead, MD;  Location: [facility];  Service: General;  Laterality: N/A;  . Cholecystectomy  09/14/2011    Procedure: LAPAROSCOPIC CHOLECYSTECTOMY;  Surgeon: Odis Hollingshead, MD;  Location: [facility];   Service: General;  Laterality: N/A;  attempted intraoperative cholangiogram     Current Outpatient Prescriptions  Medication Sig Dispense Refill  . aspirin 81 MG tablet Take 81 mg by mouth every morning.       . B Complex-C (SUPER B COMPLEX PO) Take 1 tablet by mouth every morning.       . Cholecalciferol 1000 UNITS capsule Take 1,000 Units by mouth every morning.       . clonazePAM (KLONOPIN) 0.25 MG disintegrating tablet Take 1 tablet (0.25 mg total) by mouth 2 (two) times daily as needed (insomnia or anxiety). Insomnia  60 tablet  2  . FLUoxetine (PROZAC) 10 MG tablet TAKE 2 TABLETS BY MOUTH DAILY  60 tablet  4  . furosemide (LASIX) 20 MG tablet Take 1 tablet (20 mg total) by mouth daily as needed (swelling).  30 tablet  5  . hydrocortisone (ANUSOL-HC) 2.5 % rectal cream Use bid perianal  30 g  1  . hydrocortisone (ANUSOL-HC) 25 MG suppository Place 1 suppository (25 mg total) rectally 2 (two) times daily.  20 suppository  1  . indapamide (LOZOL) 1.25 MG tablet Take 1 tablet (1.25 mg total)  by mouth every morning. For swelling  30 tablet  5  . loratadine (CLARITIN) 10 MG tablet Take 10 mg by mouth every morning.      Marland Kitchen losartan-hydrochlorothiazide (HYZAAR) 100-25 MG per tablet Take 1 tablet by mouth every morning.      . mometasone-formoterol (DULERA) 200-5 MCG/ACT AERO Inhale 2 puffs into the lungs 1 day or 1 dose.  1 Inhaler  11  . Omega-3 Fatty Acids (FISH OIL) 1000 MG CAPS Take 1 capsule by mouth every morning.       Marland Kitchen omeprazole (PRILOSEC) 20 MG capsule TAKE 2 CAPSULES BY MOUTH DAILY  60 capsule  3  . potassium chloride (KLOR-CON) 8 MEQ tablet Plotnikov, Aleksei  30 tablet  5  . traMADol (ULTRAM) 50 MG tablet Take 1 tablet (50 mg total) by mouth every 8 (eight) hours as needed for pain.  100 tablet  3  . triamcinolone (KENALOG) 0.1 % paste Place 1 application onto teeth 4 (four) times daily as needed. For pain       No current facility-administered medications for this visit.     Allergies  Allergen Reactions  . Atorvastatin     REACTION: upset stomach  . Enalapril Maleate   . Hctz [Hydrochlorothiazide]     Dizzy   . Lovastatin     REACTION: tongue stress  . Simvastatin     REACTION: mouth sores    History   Social History  . Marital Status: Married    Spouse Name: N/A    Number of Children: 2  . Years of Education: N/A   Occupational History  . Retired    Social History Main Topics  . Smoking status: Former Smoker -- 0.50 packs/day for 35 years    Types: Cigarettes    Quit date: 05/24/2001  . Smokeless tobacco: Never Used  . Alcohol Use: Yes     Comment: socially   . Drug Use: No  . Sexual Activity: Not Currently   Other Topics Concern  . Not on file   Social History Narrative  . No narrative on file    Family History  Problem Relation Age of Onset  . Hypertension Other   . Hypertension Mother   . Arthritis Mother   . Diabetes Mother   . Stroke Mother   . Stroke Father   . Hypertension Father   . CAD Neg Hx     Review of Systems:  As stated in the HPI and otherwise negative.   BP 131/85  Pulse 80  Ht 5\' 4"  (1.626 m)  Wt 223 lb 12.8 oz (101.515 kg)  BMI 38.40 kg/m2  Physical Examination: General: Well developed, well nourished, NAD HEENT: OP clear, mucus membranes moist SKIN: warm, dry. No rashes. Neuro: No focal deficits Musculoskeletal: Muscle strength 5/5 all ext Psychiatric: Mood and affect normal Neck: No JVD, no carotid bruits, no thyromegaly, no lymphadenopathy. Lungs:Clear bilaterally, no wheezes, rhonci, crackles Cardiovascular: Regular rate and rhythm. No murmurs, gallops or rubs. Abdomen:Soft. Bowel sounds present. Non-tender.  Extremities: No lower extremity edema. Pulses are 2 + in the bilateral DP/PT.  EKG: NSR, rate 80 bpm.   Assessment and Plan:   1. Chest pain/dyspnea/fatigue: Her risk factors for CAD include age, former tobacco abuse, HTN, HLD. Will arrange Lexiscan stress myoview to  exclude ischemia. Will arrange echo to exclude structural heart disease, assess LVEF, exclude pericardial effusion. Recent TSH normal. Electrolytes normal.   2. Palpitations: Will arrange 48 hour monitor to exclude arrythmias.

## 2013-07-07 ENCOUNTER — Other Ambulatory Visit: Payer: Self-pay | Admitting: Internal Medicine

## 2013-07-09 ENCOUNTER — Encounter (INDEPENDENT_AMBULATORY_CARE_PROVIDER_SITE_OTHER): Payer: Medicare Other

## 2013-07-09 ENCOUNTER — Encounter: Payer: Self-pay | Admitting: *Deleted

## 2013-07-09 DIAGNOSIS — R002 Palpitations: Secondary | ICD-10-CM | POA: Diagnosis not present

## 2013-07-09 NOTE — Progress Notes (Signed)
Patient ID: Sabrina Page, female   DOB: 10/25/1933, 80 y.o.   MRN: [id number] E-Cardio 48 hour holter monitor applied to patient.

## 2013-07-12 ENCOUNTER — Encounter: Payer: Self-pay | Admitting: Cardiology

## 2013-07-16 ENCOUNTER — Ambulatory Visit (HOSPITAL_COMMUNITY): Payer: Medicare Other | Attending: Cardiovascular Disease | Admitting: Radiology

## 2013-07-16 DIAGNOSIS — Z6838 Body mass index (BMI) 38.0-38.9, adult: Secondary | ICD-10-CM | POA: Diagnosis not present

## 2013-07-16 DIAGNOSIS — E669 Obesity, unspecified: Secondary | ICD-10-CM | POA: Insufficient documentation

## 2013-07-16 DIAGNOSIS — E785 Hyperlipidemia, unspecified: Secondary | ICD-10-CM | POA: Diagnosis not present

## 2013-07-16 DIAGNOSIS — J4489 Other specified chronic obstructive pulmonary disease: Secondary | ICD-10-CM | POA: Insufficient documentation

## 2013-07-16 DIAGNOSIS — R079 Chest pain, unspecified: Secondary | ICD-10-CM | POA: Diagnosis not present

## 2013-07-16 DIAGNOSIS — R002 Palpitations: Secondary | ICD-10-CM

## 2013-07-16 DIAGNOSIS — R5383 Other fatigue: Secondary | ICD-10-CM

## 2013-07-16 DIAGNOSIS — R0600 Dyspnea, unspecified: Secondary | ICD-10-CM

## 2013-07-16 DIAGNOSIS — R0602 Shortness of breath: Secondary | ICD-10-CM

## 2013-07-16 DIAGNOSIS — I10 Essential (primary) hypertension: Secondary | ICD-10-CM | POA: Diagnosis not present

## 2013-07-16 DIAGNOSIS — J449 Chronic obstructive pulmonary disease, unspecified: Secondary | ICD-10-CM | POA: Insufficient documentation

## 2013-07-16 DIAGNOSIS — E119 Type 2 diabetes mellitus without complications: Secondary | ICD-10-CM | POA: Diagnosis not present

## 2013-07-16 DIAGNOSIS — I379 Nonrheumatic pulmonary valve disorder, unspecified: Secondary | ICD-10-CM | POA: Insufficient documentation

## 2013-07-16 DIAGNOSIS — Z87891 Personal history of nicotine dependence: Secondary | ICD-10-CM | POA: Insufficient documentation

## 2013-07-16 NOTE — Progress Notes (Signed)
Echocardiogram Performed. 

## 2013-07-19 ENCOUNTER — Encounter (HOSPITAL_COMMUNITY): Payer: Medicare Other

## 2013-07-27 ENCOUNTER — Ambulatory Visit: Payer: Medicare Other | Admitting: Cardiology

## 2013-07-27 ENCOUNTER — Other Ambulatory Visit: Payer: Self-pay | Admitting: Internal Medicine

## 2013-07-27 ENCOUNTER — Institutional Professional Consult (permissible substitution): Payer: Medicare Other | Admitting: Cardiology

## 2013-07-30 ENCOUNTER — Other Ambulatory Visit: Payer: Self-pay | Admitting: Internal Medicine

## 2013-07-30 DIAGNOSIS — R0989 Other specified symptoms and signs involving the circulatory and respiratory systems: Secondary | ICD-10-CM

## 2013-07-30 DIAGNOSIS — R0609 Other forms of dyspnea: Secondary | ICD-10-CM | POA: Diagnosis not present

## 2013-08-01 ENCOUNTER — Encounter: Payer: Self-pay | Admitting: Internal Medicine

## 2013-08-01 DIAGNOSIS — G4733 Obstructive sleep apnea (adult) (pediatric): Secondary | ICD-10-CM | POA: Diagnosis not present

## 2013-08-02 ENCOUNTER — Ambulatory Visit (HOSPITAL_COMMUNITY): Payer: Medicare Other | Attending: Cardiovascular Disease | Admitting: Radiology

## 2013-08-02 ENCOUNTER — Encounter: Payer: Self-pay | Admitting: Cardiovascular Disease

## 2013-08-02 VITALS — BP 131/63 | HR 73 | Ht 64.0 in | Wt 223.0 lb

## 2013-08-02 DIAGNOSIS — R5381 Other malaise: Secondary | ICD-10-CM | POA: Diagnosis not present

## 2013-08-02 DIAGNOSIS — R0609 Other forms of dyspnea: Secondary | ICD-10-CM | POA: Diagnosis not present

## 2013-08-02 DIAGNOSIS — R109 Unspecified abdominal pain: Secondary | ICD-10-CM | POA: Diagnosis not present

## 2013-08-02 DIAGNOSIS — R0600 Dyspnea, unspecified: Secondary | ICD-10-CM

## 2013-08-02 DIAGNOSIS — R5383 Other fatigue: Secondary | ICD-10-CM

## 2013-08-02 DIAGNOSIS — R0989 Other specified symptoms and signs involving the circulatory and respiratory systems: Secondary | ICD-10-CM | POA: Insufficient documentation

## 2013-08-02 DIAGNOSIS — R0602 Shortness of breath: Secondary | ICD-10-CM | POA: Diagnosis not present

## 2013-08-02 DIAGNOSIS — R079 Chest pain, unspecified: Secondary | ICD-10-CM

## 2013-08-02 MED ORDER — TECHNETIUM TC 99M SESTAMIBI GENERIC - CARDIOLITE
11.0000 | Freq: Once | INTRAVENOUS | Status: AC | PRN
  Administered 2013-08-02: 11 via INTRAVENOUS

## 2013-08-02 MED ORDER — AMINOPHYLLINE 25 MG/ML IV SOLN
75.0000 mg | Freq: Once | INTRAVENOUS | Status: AC
  Administered 2013-08-02: 75 mg via INTRAVENOUS

## 2013-08-02 MED ORDER — TECHNETIUM TC 99M SESTAMIBI GENERIC - CARDIOLITE
33.0000 | Freq: Once | INTRAVENOUS | Status: AC | PRN
  Administered 2013-08-02: 33 via INTRAVENOUS

## 2013-08-02 MED ORDER — REGADENOSON 0.4 MG/5ML IV SOLN
0.4000 mg | Freq: Once | INTRAVENOUS | Status: AC
  Administered 2013-08-02: 0.4 mg via INTRAVENOUS

## 2013-08-02 NOTE — Progress Notes (Signed)
[facility] [address] [place], [place] [address] [phone]    Cardiology Nuclear Med Study  Sabrina Page is a 80 y.o. female     MRN : [id number]     DOB: 04-11-1934  Procedure Date: 08/02/2013  Nuclear Med Background Indication for Stress Test:  Evaluation for Ischemia History:  No prior known history of CAD, Cath,and 03-2009 Myocardial Perfusion Imaging-, EF=64% Cardiac Risk Factors: History of Smoking, Hypertension, Lipids and PVD  Symptoms: Chest Pain without exertion (last occurrence yesterday),  Dizziness, DOE, Palpitations and SOB   Nuclear Pre-Procedure Caffeine/Decaff Intake:  None NPO After: 8:00pm   Lungs:  Coarse breath sounds, no wheezing O2 Sat: 95% on room air. IV 0.9% NS with Angio Cath:  22g  IV Site: R Hand  IV Started by:  Crissie Figures, RN  Chest Size (in):  40 Cup Size: DDD  Height: 5\' 4"  (1.626 m)  Weight:  223 lb (101.152 kg)  BMI:  Body mass index is 38.26 kg/(m^2). Tech Comments:  N/A    Nuclear Med Study 1 or 2 day study: 1 day  Stress Test Type:  Lexiscan  Reading MD: N/A  Order Authorizing Provider:  Lauree Chandler, MD  Resting Radionuclide: Technetium 18m Sestamibi  Resting Radionuclide Dose: 11.0 mCi   Stress Radionuclide:  Technetium 11m Sestamibi  Stress Radionuclide Dose: 33.0 mCi           Stress Protocol Rest HR: 73 Stress HR: 90  Rest BP: 131/63 Stress BP: 156/86  Exercise Time (min): n/a METS: n/a   Predicted Max HR: 143 bpm % Max HR: 62.94 bpm Rate Pressure Product: 14040   Dose of Adenosine (mg):  n/a Dose of Lexiscan: 0.4 mg  Dose of Atropine (mg): n/a Dose of Dobutamine: n/a mcg/kg/min (at max HR)  Stress Test Technologist: Irven Baltimore, RN  Nuclear Technologist:  Charlton Amor, CNMT     Rest Procedure:  Myocardial perfusion imaging was performed at rest 45 minutes following the intravenous administration of Technetium 68m Sestamibi. Rest ECG: NSR - Normal EKG  Stress  Procedure:  The patient received IV Lexiscan 0.4 mg over 15-seconds.  Technetium 39m Sestamibi injected at 30-seconds.  The patient complained of chest pressure, weakness,belly pain, and nausea with Lexiscan. Quantitative spect images were obtained after a 45 minute delay. Aminophylline 75 mg IVP given post recovery due to persistent stomach pain, and weakness with quick resolution of symptoms.  Stress ECG: No significant change from baseline ECG  QPS Raw Data Images:  Normal; no motion artifact; normal heart/lung ratio. Stress Images:  Normal homogeneous uptake in all areas of the myocardium. Rest Images:  Normal homogeneous uptake in all areas of the myocardium. Subtraction (SDS):  No evidence of ischemia. Transient Ischemic Dilatation (Normal <1.22):  0.88 Lung/Heart Ratio (Normal <0.45):  0.37  Quantitative Gated Spect Images QGS EDV:  79 ml QGS ESV:  27 ml  Impression Exercise Capacity:  Lexiscan with no exercise. BP Response:  Normal blood pressure response. Clinical Symptoms:  Mild chest pain/dyspnea. ECG Impression:  No significant ST segment change suggestive of ischemia. Comparison with Prior Nuclear Study: No significant change from previous study  Overall Impression:  Normal stress nuclear study.  LV Ejection Fraction: 66%.  LV Wall Motion:  NL LV Function; NL Wall Motion   [employer]

## 2013-08-03 ENCOUNTER — Other Ambulatory Visit: Payer: Self-pay | Admitting: Internal Medicine

## 2013-08-21 ENCOUNTER — Ambulatory Visit (INDEPENDENT_AMBULATORY_CARE_PROVIDER_SITE_OTHER): Payer: Medicare Other | Admitting: Cardiovascular Disease

## 2013-08-21 ENCOUNTER — Encounter: Payer: Self-pay | Admitting: Cardiovascular Disease

## 2013-08-21 VITALS — BP 132/60 | HR 80 | Ht 65.0 in | Wt 225.0 lb

## 2013-08-21 DIAGNOSIS — I498 Other specified cardiac arrhythmias: Secondary | ICD-10-CM

## 2013-08-21 DIAGNOSIS — R079 Chest pain, unspecified: Secondary | ICD-10-CM | POA: Diagnosis not present

## 2013-08-21 DIAGNOSIS — I471 Supraventricular tachycardia: Secondary | ICD-10-CM

## 2013-08-21 MED ORDER — DILTIAZEM HCL ER COATED BEADS 120 MG PO CP24: 120.0000 mg | ORAL_CAPSULE | Freq: Every day | ORAL | Status: DC

## 2013-08-21 NOTE — Patient Instructions (Signed)
Your physician recommends that you schedule a follow-up appointment in:  3-4 months with NP or PA.  Your physician has recommended you make the following change in your medication:  Start Cardizem CD 120 mg by mouth daily

## 2013-08-21 NOTE — Progress Notes (Signed)
History of Present Illness: 80 yo female with history of HTN, HLD, asthma, COPD, probable OSA, GERD here today for cardiac follow up. I met her as a new patient for evaluation of SOB, dizziness, weakness,  chest pain, palpitations on 07/06/13. Her blood pressure has been erratic. She feels that her heart races at times. This occurs mostly at rest, two times per week. No irregularity. She has central chest pains/heaviness several times per week. Also dypnea with minimal exertion. Normal stress myoview 04/21/09. Remote cardiac cath reported as normal by patient. She is here today with an interpretor. She only speaks [place]. I arranged an echo, stress test and 48 hour monitor. Her echo 07/16/13 showed normal LV function with mild LVH, grade 2 diastolic dysfunction. Stress myoview 08/03/13 without ischemia. 48 hour monitor showed rare PVCs, PACs with short run SVT (6 beats).   She is here today for follow up.   Primary Care Physician:  Sabrina Page  Last Lipid Profile:Lipid Panel     Component Value Date/Time   CHOL 226* 05/16/2012 0909   TRIG 123.0 05/16/2012 0909   HDL 50.30 05/16/2012 0909   CHOLHDL 4 05/16/2012 0909   VLDL 24.6 05/16/2012 0909   LDLCALC 107* 04/04/2009 0832     Past Medical History  Diagnosis Date  . Allergy     rhinitis  . Asthma   . Hyperlipidemia   . Hypertension   . Depression   . GERD (gastroesophageal reflux disease)   . Osteoarthritis   . Obesity   . Urinary incontinence   . Shoulder injury     left  . COPD (chronic obstructive pulmonary disease)   . Osteoporosis   . Gallstones   . Peripheral vascular disease     swelling   . Shortness of breath     with exertion   . Pneumonia   . Sleep apnea     never diagnosed   . Chronic kidney disease     hx of cystitis   . Dizziness     vertigo     Past Surgical History  Procedure Laterality Date  . Right left knee arthroplast    . L tkr    . Other surgical history      left leg surgery   . Other  surgical history      left breast surgery due to mastitis   . Liver biopsy  09/14/2011    Procedure: LIVER BIOPSY;  Surgeon: Odis Hollingshead, MD;  Location: [facility];  Service: General;  Laterality: N/A;  . Cholecystectomy  09/14/2011    Procedure: LAPAROSCOPIC CHOLECYSTECTOMY;  Surgeon: Odis Hollingshead, MD;  Location: [facility];  Service: General;  Laterality: N/A;  attempted intraoperative cholangiogram     Current Outpatient Prescriptions  Medication Sig Dispense Refill  . aspirin 81 MG tablet Take 81 mg by mouth every morning.       . B Complex-C (SUPER B COMPLEX PO) Take 1 tablet by mouth every morning.       . Cholecalciferol 1000 UNITS capsule Take 1,000 Units by mouth every morning.       . clonazePAM (KLONOPIN) 0.25 MG disintegrating tablet Take 1 tablet (0.25 mg total) by mouth 2 (two) times daily as needed (insomnia or anxiety). Insomnia  60 tablet  2  . FLUoxetine (PROZAC) 10 MG tablet TAKE 2 TABLETS BY MOUTH DAILY  60 tablet  4  . furosemide (LASIX) 20 MG tablet Take 20 mg by mouth. prn      .  hydrocortisone (ANUSOL-HC) 2.5 % rectal cream Use bid perianal  30 g  1  . hydrocortisone (ANUSOL-HC) 25 MG suppository Place 1 suppository (25 mg total) rectally 2 (two) times daily.  20 suppository  1  . indapamide (LOZOL) 1.25 MG tablet Take 1 tablet (1.25 mg total) by mouth every morning. For swelling  30 tablet  5  . KLOR-CON 8 MEQ tablet TAKE 1 TABLET EVERY MORNING  30 tablet  5  . KLOR-CON 8 MEQ tablet TAKE 1 TABLET EVERY MORNING  30 tablet  5  . loratadine (CLARITIN) 10 MG tablet Take 10 mg by mouth every morning.      Marland Kitchen losartan-hydrochlorothiazide (HYZAAR) 100-25 MG per tablet Take 1 tablet by mouth daily.      . mometasone-formoterol (DULERA) 200-5 MCG/ACT AERO Inhale 2 puffs into the lungs 1 day or 1 dose.  1 Inhaler  11  . naproxen (NAPROSYN) 500 MG tablet TAKE 1 TABLET TWICE A DAY AS NEEDED FOR PAIN  60 tablet  1  . Omega-3 Fatty Acids (FISH OIL) 1000 MG CAPS Take 1 capsule by  mouth every morning.       Marland Kitchen omeprazole (PRILOSEC) 20 MG capsule TAKE 2 CAPSULES BY MOUTH DAILY  60 capsule  3  . traMADol (ULTRAM) 50 MG tablet Take 1 tablet (50 mg total) by mouth every 8 (eight) hours as needed for pain.  100 tablet  3  . triamcinolone (KENALOG) 0.1 % paste Place 1 application onto teeth 4 (four) times daily as needed. For pain       No current facility-administered medications for this visit.    Allergies  Allergen Reactions  . Atorvastatin     REACTION: upset stomach  . Enalapril Maleate   . Hctz [Hydrochlorothiazide]     Dizzy   . Lovastatin     REACTION: tongue stress  . Simvastatin     REACTION: mouth sores    History   Social History  . Marital Status: Married    Spouse Name: N/A    Number of Children: 2  . Years of Education: N/A   Occupational History  . Retired    Social History Main Topics  . Smoking status: Former Smoker -- 0.50 packs/day for 35 years    Types: Cigarettes    Quit date: 05/24/2001  . Smokeless tobacco: Never Used  . Alcohol Use: Yes     Comment: socially   . Drug Use: No  . Sexual Activity: Not Currently   Other Topics Concern  . Not on file   Social History Narrative  . No narrative on file    Family History  Problem Relation Age of Onset  . Hypertension Other   . Hypertension Mother   . Arthritis Mother   . Diabetes Mother   . Stroke Mother   . Stroke Father   . Hypertension Father   . CAD Neg Hx     Review of Systems:  As stated in the HPI and otherwise negative.   BP 132/60  Pulse 80  Ht $R'5\' 5"'uV$  (1.651 m)  Wt 225 lb (102.059 kg)  BMI 37.44 kg/m2  Physical Examination: General: Well developed, well nourished, NAD HEENT: OP clear, mucus membranes moist SKIN: warm, dry. No rashes. Neuro: No focal deficits Musculoskeletal: Muscle strength 5/5 all ext Psychiatric: Mood and affect normal Neck: No JVD, no carotid bruits, no thyromegaly, no lymphadenopathy. Lungs:Clear bilaterally, no wheezes,  rhonci, crackles Cardiovascular: Regular rate and rhythm. No murmurs, gallops or rubs. Abdomen:Soft.  Bowel sounds present. Non-tender.  Extremities: No lower extremity edema. Pulses are 2 + in the bilateral DP/PT  Echo 07/16/13: Left ventricle: The cavity size was normal. Wall thickness was increased in a pattern of mild LVH. Systolic function was normal. The estimated ejection fraction was in the range of 55% to 60%. Wall motion was normal; there were no regional wall motion abnormalities. Features are consistent with a pseudonormal left ventricular filling pattern, with concomitant abnormal relaxation and increased filling pressure (grade 2 diastolic dysfunction). - Left atrium: The atrium was mildly dilated.  Stress myoview: 08/02/13: Stress Procedure: The patient received IV Lexiscan 0.4 mg over 15-seconds. Technetium 49m Sestamibi injected at 30-seconds. The patient complained of chest pressure, weakness,belly pain, and nausea with Lexiscan. Quantitative spect images were obtained after a 45 minute delay.  Aminophylline 75 mg IVP given post recovery due to persistent stomach pain, and weakness with quick resolution of symptoms.  Stress ECG: No significant change from baseline ECG  QPS  Raw Data Images: Normal; no motion artifact; normal heart/lung ratio.  Stress Images: Normal homogeneous uptake in all areas of the myocardium.  Rest Images: Normal homogeneous uptake in all areas of the myocardium.  Subtraction (SDS): No evidence of ischemia.  Transient Ischemic Dilatation (Normal <1.22): 0.88  Lung/Heart Ratio (Normal <0.45): 0.37  Quantitative Gated Spect Images  QGS EDV: 79 ml  QGS ESV: 27 ml  Impression  Exercise Capacity: Lexiscan with no exercise.  BP Response: Normal blood pressure response.  Clinical Symptoms: Mild chest pain/dyspnea.  ECG Impression: No significant ST segment change suggestive of ischemia.  Comparison with Prior Nuclear Study: No significant change from  previous study  Overall Impression: Normal stress nuclear study.  LV Ejection Fraction: 66%. LV Wall Motion: NL LV Function; NL Wall Motion  Assessment and Plan:   1. Chest pain/dyspnea/fatigue: Stress test without ischemia. Recent TSH normal. Electrolytes normal. Echo without major abnormalities.   2. Palpitations/SVT: Short runs of SVT with PACs. Will start Cardizem CD 120 mg po Qdaily

## 2013-08-25 ENCOUNTER — Other Ambulatory Visit: Payer: Self-pay | Admitting: Internal Medicine

## 2013-08-27 ENCOUNTER — Ambulatory Visit (INDEPENDENT_AMBULATORY_CARE_PROVIDER_SITE_OTHER): Payer: Medicare Other | Admitting: Internal Medicine

## 2013-08-27 ENCOUNTER — Encounter: Payer: Self-pay | Admitting: Internal Medicine

## 2013-08-27 VITALS — BP 112/68 | HR 76 | Temp 99.3°F | Resp 16 | Wt 225.0 lb

## 2013-08-27 DIAGNOSIS — J45909 Unspecified asthma, uncomplicated: Secondary | ICD-10-CM | POA: Diagnosis not present

## 2013-08-27 DIAGNOSIS — M545 Low back pain, unspecified: Secondary | ICD-10-CM

## 2013-08-27 DIAGNOSIS — G4733 Obstructive sleep apnea (adult) (pediatric): Secondary | ICD-10-CM | POA: Diagnosis not present

## 2013-08-27 DIAGNOSIS — I10 Essential (primary) hypertension: Secondary | ICD-10-CM | POA: Diagnosis not present

## 2013-08-27 DIAGNOSIS — M79605 Pain in left leg: Secondary | ICD-10-CM

## 2013-08-27 DIAGNOSIS — M79604 Pain in right leg: Secondary | ICD-10-CM

## 2013-08-27 MED ORDER — POTASSIUM CHLORIDE ER 10 MEQ PO TBCR: 10.0000 meq | EXTENDED_RELEASE_TABLET | Freq: Every day | ORAL | Status: DC

## 2013-08-27 NOTE — Progress Notes (Signed)
Pre visit review using our clinic review tool, if applicable. No additional management support is needed unless otherwise documented below in the visit note. 

## 2013-08-27 NOTE — Assessment & Plan Note (Signed)
Continue with current prescription therapy as reflected on the Med list.  

## 2013-08-27 NOTE — Progress Notes (Signed)
Subjective:     HPI F/u OSA. Unable to loose wt C/o memory issues She fell down a few nights ago F/u SOB at night, ?snoring; tired in am No CP F/u hemorrhoid w/bleeding - resolved. It was painful, bled a lot Less pain today She had a colonoscopy 7-8 y ago with Dr Collene Mares  F/u worse LBP - better; gained wt   Past Medical History  Diagnosis Date  . Allergy     rhinitis  . Asthma   . Hyperlipidemia   . Hypertension   . Depression   . GERD (gastroesophageal reflux disease)   . Osteoarthritis   . Obesity   . Urinary incontinence   . Shoulder injury     left  . COPD (chronic obstructive pulmonary disease)   . Osteoporosis   . Gallstones   . Peripheral vascular disease     swelling   . Shortness of breath     with exertion   . Pneumonia   . Sleep apnea     never diagnosed   . Chronic kidney disease     hx of cystitis   . Dizziness     vertigo    Past Surgical History  Procedure Laterality Date  . Right left knee arthroplast    . L tkr    . Other surgical history      left leg surgery   . Other surgical history      left breast surgery due to mastitis   . Liver biopsy  09/14/2011    Procedure: LIVER BIOPSY;  Surgeon: Odis Hollingshead, MD;  Location: [facility];  Service: General;  Laterality: N/A;  . Cholecystectomy  09/14/2011    Procedure: LAPAROSCOPIC CHOLECYSTECTOMY;  Surgeon: Odis Hollingshead, MD;  Location: [facility];  Service: General;  Laterality: N/A;  attempted intraoperative cholangiogram     reports that she quit smoking about 12 years ago. Her smoking use included Cigarettes. She has a 17.5 pack-year smoking history. She has never used smokeless tobacco. She reports that she drinks alcohol. She reports that she does not use illicit drugs. family history includes Arthritis in her mother; Diabetes in her mother; Hypertension in her father, mother, and other; Stroke in her father and mother. There is no history of CAD. Allergies  Allergen Reactions  .  Atorvastatin     REACTION: upset stomach  . Enalapril Maleate   . Hctz [Hydrochlorothiazide]     Dizzy   . Lovastatin     REACTION: tongue stress  . Simvastatin     REACTION: mouth sores   Current Outpatient Prescriptions on File Prior to Visit  Medication Sig Dispense Refill  . aspirin 81 MG tablet Take 81 mg by mouth every morning.       . B Complex-C (SUPER B COMPLEX PO) Take 1 tablet by mouth every morning.       . Cholecalciferol 1000 UNITS capsule Take 1,000 Units by mouth every morning.       . clonazePAM (KLONOPIN) 0.25 MG disintegrating tablet Take 1 tablet (0.25 mg total) by mouth 2 (two) times daily as needed (insomnia or anxiety). Insomnia  60 tablet  2  . FLUoxetine (PROZAC) 10 MG tablet TAKE 2 TABLETS BY MOUTH DAILY  60 tablet  4  . furosemide (LASIX) 20 MG tablet Take 20 mg by mouth. prn      . hydrocortisone (ANUSOL-HC) 2.5 % rectal cream Use bid perianal  30 g  1  . hydrocortisone (ANUSOL-HC) 25 MG  suppository Place 1 suppository (25 mg total) rectally 2 (two) times daily.  20 suppository  1  . indapamide (LOZOL) 1.25 MG tablet TAKE 1 TABLET BY MOUTH EVERY MORNING FOR SWELLING  30 tablet  5  . KLOR-CON 8 MEQ tablet TAKE 1 TABLET EVERY MORNING  30 tablet  5  . KLOR-CON 8 MEQ tablet TAKE 1 TABLET EVERY MORNING  30 tablet  5  . loratadine (CLARITIN) 10 MG tablet Take 10 mg by mouth every morning.      Marland Kitchen losartan-hydrochlorothiazide (HYZAAR) 100-25 MG per tablet Take 1 tablet by mouth daily.      . mometasone-formoterol (DULERA) 200-5 MCG/ACT AERO Inhale 2 puffs into the lungs 1 day or 1 dose.  1 Inhaler  11  . naproxen (NAPROSYN) 500 MG tablet TAKE 1 TABLET TWICE A DAY AS NEEDED FOR PAIN  60 tablet  1  . Omega-3 Fatty Acids (FISH OIL) 1000 MG CAPS Take 1 capsule by mouth every morning.       Marland Kitchen omeprazole (PRILOSEC) 20 MG capsule TAKE 2 CAPSULES BY MOUTH DAILY  60 capsule  3  . traMADol (ULTRAM) 50 MG tablet Take 1 tablet (50 mg total) by mouth every 8 (eight) hours as  needed for pain.  100 tablet  3  . triamcinolone (KENALOG) 0.1 % paste Place 1 application onto teeth 4 (four) times daily as needed. For pain      . diltiazem (CARDIZEM CD) 120 MG 24 hr capsule Take 1 capsule (120 mg total) by mouth daily.  30 capsule  6   No current facility-administered medications on file prior to visit.   BP 112/68  Pulse 76  Temp(Src) 99.3 F (37.4 C) (Oral)  Resp 16  Wt 225 lb (102.059 kg)   Review of Systems  Constitutional: Negative for fever, chills, activity change, appetite change, fatigue and unexpected weight change.  HENT: Negative for congestion, mouth sores and sinus pressure.   Eyes: Negative for visual disturbance.  Respiratory: Negative for cough and chest tightness.   Gastrointestinal: Negative for nausea and abdominal pain.  Genitourinary: Negative for frequency, difficulty urinating and vaginal pain.  Musculoskeletal: Positive for gait problem. Negative for back pain and myalgias.  Skin: Negative for pallor and rash.  Neurological: Negative for dizziness, tremors, weakness, numbness and headaches.  Psychiatric/Behavioral: Negative for confusion and sleep disturbance. The patient is nervous/anxious.    Wt Readings from Last 3 Encounters:  08/27/13 225 lb (102.059 kg)  08/21/13 225 lb (102.059 kg)  08/02/13 223 lb (101.152 kg)   BP Readings from Last 3 Encounters:  08/27/13 112/68  08/21/13 132/60  08/02/13 131/63       Objective:   Physical Exam  Constitutional: She appears well-developed. No distress.  HENT:  Head: Normocephalic.  Right Ear: External ear normal.  Left Ear: External ear normal.  Nose: Nose normal.  Mouth/Throat: Oropharynx is clear and moist.  Eyes: Conjunctivae are normal. Pupils are equal, round, and reactive to light. Right eye exhibits no discharge. Left eye exhibits no discharge.  Neck: Normal range of motion. Neck supple. No JVD present. No tracheal deviation present. No thyromegaly present.   Cardiovascular: Normal rate, regular rhythm and normal heart sounds.   Pulmonary/Chest: No stridor. No respiratory distress. She has no wheezes.  Abdominal: Soft. Bowel sounds are normal. She exhibits no distension and no mass. There is no tenderness. There is no rebound and no guarding.  Musculoskeletal: She exhibits no edema and no tenderness.  Lymphadenopathy:  She has no cervical adenopathy.  Neurological: She displays abnormal reflex. No cranial nerve deficit. She exhibits normal muscle tone. Coordination abnormal.  Skin: No rash noted. No erythema.  Psychiatric: She has a normal mood and affect. Her behavior is normal. Judgment and thought content normal.   LS is less tender  Lab Results  Component Value Date   WBC 4.9 09/06/2011   HGB 14.5 09/06/2011   HCT 45.2 09/06/2011   PLT 186 09/06/2011   GLUCOSE 89 06/25/2013   CHOL 226* 05/16/2012   TRIG 123.0 05/16/2012   HDL 50.30 05/16/2012   LDLDIRECT 159.1 05/16/2012   LDLCALC 107* 04/04/2009   ALT 17 08/15/2012   AST 17 08/15/2012   NA 137 06/25/2013   K 4.1 06/25/2013   CL 97 06/25/2013   CREATININE 1.0 06/25/2013   BUN 15 06/25/2013   CO2 32 06/25/2013   TSH 1.62 06/25/2013   INR 1.07 09/06/2011   HGBA1C 5.8 06/25/2013      MRI 1/13: IMPRESSION:  1. Severe multifactorial spinal and lateral recess stenosis at L4-  L5 plus moderate right foraminal stenosis.  2. Borderline to mild multifactorial spinal stenosis at T11-T12  and L3-L4.  3. Mild to moderate multifactorial left L5 foraminal stenosis.   Original Report Authenticated By: Randall An, M.D. Clinical Data: [place]. Hit head.    CT HEAD WITHOUT CONTRAST 12/11 CT CERVICAL SPINE WITHOUT CONTRAST  Technique: Multidetector CT imaging of the head and cervical spine  was performed following the standard protocol without intravenous  contrast. Multiplanar CT image reconstructions of the cervical  spine were also generated.  Comparison: None  CT HEAD  Findings: There is mild  age related cerebral atrophy and  ventriculomegaly. No extra-axial fluid collections are seen. No  CT findings for hemispheric infarction or intracranial hemorrhage.  No mass lesions. The brainstem and cerebellum are unremarkable.  The bony calvarium is intact. Benign appearing calvarial lesion  noted in the right frontal area. The paranasal sinuses and mastoid  air cells are clear. The globes are intact.  IMPRESSION:  1. Age related cerebral atrophy and ventriculomegaly.  2. No acute intracranial findings or mass lesions.  3. No skull fracture.  CT CERVICAL SPINE  Findings: Advanced degenerative cervical spondylosis with disc  disease and facet disease most notable at C4-5, C5-6 and C6-7. The  facets are normally aligned. No facet or laminar fractures. No  acute vertebral body fractures or abnormal prevertebral soft tissue  swelling. The C1-2 articulations are maintained. The dens appears  normal.  No large disc protrusions are seen. There is moderate osteophytic  spurring posteriorly at C5-6 and C6-7 with mild mass effect on the  thecal sac. Mild bilateral foraminal stenosis at C5-6 and C6-7.  IMPRESSION:  1. Degenerative cervical spondylosis with disc disease and facet  disease.  2. No acute bony findings and normal alignment.   Provider: Durene Fruits   Lab Results  Component Value Date   WBC 4.9 09/06/2011   HGB 14.5 09/06/2011   HCT 45.2 09/06/2011   PLT 186 09/06/2011   GLUCOSE 89 06/25/2013   CHOL 226* 05/16/2012   TRIG 123.0 05/16/2012   HDL 50.30 05/16/2012   LDLDIRECT 159.1 05/16/2012   LDLCALC 107* 04/04/2009   ALT 17 08/15/2012   AST 17 08/15/2012   NA 137 06/25/2013   K 4.1 06/25/2013   CL 97 06/25/2013   CREATININE 1.0 06/25/2013   BUN 15 06/25/2013   CO2 32 06/25/2013   TSH 1.62 06/25/2013  INR 1.07 09/06/2011   HGBA1C 5.8 06/25/2013        Assessment & Plan:

## 2013-08-27 NOTE — Assessment & Plan Note (Signed)
3/15 - pt declined CPAP

## 2013-08-28 ENCOUNTER — Telehealth: Payer: Self-pay | Admitting: Internal Medicine

## 2013-08-28 NOTE — Telephone Encounter (Signed)
Relevant patient education mailed to patient.  

## 2013-10-01 ENCOUNTER — Other Ambulatory Visit: Payer: Self-pay | Admitting: Internal Medicine

## 2013-10-29 ENCOUNTER — Encounter: Payer: Self-pay | Admitting: Internal Medicine

## 2013-10-29 ENCOUNTER — Ambulatory Visit (INDEPENDENT_AMBULATORY_CARE_PROVIDER_SITE_OTHER): Payer: Medicare Other | Admitting: Internal Medicine

## 2013-10-29 VITALS — BP 110/70 | HR 76 | Temp 98.1°F | Resp 16 | Wt 229.0 lb

## 2013-10-29 DIAGNOSIS — G4733 Obstructive sleep apnea (adult) (pediatric): Secondary | ICD-10-CM

## 2013-10-29 DIAGNOSIS — R079 Chest pain, unspecified: Secondary | ICD-10-CM

## 2013-10-29 MED ORDER — CLONAZEPAM 0.25 MG PO TBDP: 0.2500 mg | ORAL_TABLET | Freq: Two times a day (BID) | ORAL | Status: DC | PRN

## 2013-10-29 NOTE — Patient Instructions (Signed)
Indapamide 0.5 tablet as needed for swelling - not every day

## 2013-10-29 NOTE — Progress Notes (Signed)
Pre visit review using our clinic review tool, if applicable. No additional management support is needed unless otherwise documented below in the visit note. 

## 2013-10-29 NOTE — Assessment & Plan Note (Signed)
Pulm cons Dr Gwenette Greet - pt would like to try CPAP: not feeling well

## 2013-10-29 NOTE — Progress Notes (Signed)
Subjective:     HPI   F/u OSA. Unable to loose wt. Wants to try CPAP. C/o cramps in hands C/o memory issues - same She fell down a few nights ago F/u SOB at night, ?snoring; tired in am No CP F/u hemorrhoid w/bleeding - resolved. It was painful, bled a lot Less pain today She had a colonoscopy 7-8 y ago with Dr Collene Mares  F/u worse LBP - better; gained wt   Past Medical History  Diagnosis Date  . Allergy     rhinitis  . Asthma   . Hyperlipidemia   . Hypertension   . Depression   . GERD (gastroesophageal reflux disease)   . Osteoarthritis   . Obesity   . Urinary incontinence   . Shoulder injury     left  . COPD (chronic obstructive pulmonary disease)   . Osteoporosis   . Gallstones   . Peripheral vascular disease     swelling   . Shortness of breath     with exertion   . Pneumonia   . Sleep apnea     never diagnosed   . Chronic kidney disease     hx of cystitis   . Dizziness     vertigo    Past Surgical History  Procedure Laterality Date  . Right left knee arthroplast    . L tkr    . Other surgical history      left leg surgery   . Other surgical history      left breast surgery due to mastitis   . Liver biopsy  09/14/2011    Procedure: LIVER BIOPSY;  Surgeon: Odis Hollingshead, MD;  Location: [facility];  Service: General;  Laterality: N/A;  . Cholecystectomy  09/14/2011    Procedure: LAPAROSCOPIC CHOLECYSTECTOMY;  Surgeon: Odis Hollingshead, MD;  Location: [facility];  Service: General;  Laterality: N/A;  attempted intraoperative cholangiogram     reports that she quit smoking about 12 years ago. Her smoking use included Cigarettes. She has a 17.5 pack-year smoking history. She has never used smokeless tobacco. She reports that she drinks alcohol. She reports that she does not use illicit drugs. family history includes Arthritis in her mother; Diabetes in her mother; Hypertension in her father, mother, and other; Stroke in her father and mother. There is no history  of CAD. Allergies  Allergen Reactions  . Atorvastatin     REACTION: upset stomach  . Enalapril Maleate   . Hctz [Hydrochlorothiazide]     Dizzy   . Lovastatin     REACTION: tongue stress  . Simvastatin     REACTION: mouth sores   Current Outpatient Prescriptions on File Prior to Visit  Medication Sig Dispense Refill  . aspirin 81 MG tablet Take 81 mg by mouth every morning.       . B Complex-C (SUPER B COMPLEX PO) Take 1 tablet by mouth every morning.       . Cholecalciferol 1000 UNITS capsule Take 1,000 Units by mouth every morning.       . clonazePAM (KLONOPIN) 0.25 MG disintegrating tablet Take 1 tablet (0.25 mg total) by mouth 2 (two) times daily as needed (insomnia or anxiety). Insomnia  60 tablet  2  . FLUoxetine (PROZAC) 10 MG tablet TAKE 2 TABLETS BY MOUTH DAILY  60 tablet  5  . hydrocortisone (ANUSOL-HC) 2.5 % rectal cream Use bid perianal  30 g  1  . hydrocortisone (ANUSOL-HC) 25 MG suppository Place 1 suppository (25  mg total) rectally 2 (two) times daily.  20 suppository  1  . indapamide (LOZOL) 1.25 MG tablet TAKE 1 TABLET BY MOUTH EVERY MORNING FOR SWELLING  30 tablet  5  . loratadine (CLARITIN) 10 MG tablet Take 10 mg by mouth every morning.      . mometasone-formoterol (DULERA) 200-5 MCG/ACT AERO Inhale 2 puffs into the lungs 1 day or 1 dose.  1 Inhaler  11  . Omega-3 Fatty Acids (FISH OIL) 1000 MG CAPS Take 1 capsule by mouth every morning.       Marland Kitchen omeprazole (PRILOSEC) 20 MG capsule TAKE 2 CAPSULES BY MOUTH DAILY  60 capsule  3  . potassium chloride (KLOR-CON 10) 10 MEQ tablet Take 1 tablet (10 mEq total) by mouth daily.  30 tablet  11  . traMADol (ULTRAM) 50 MG tablet Take 1 tablet (50 mg total) by mouth every 8 (eight) hours as needed for pain.  100 tablet  3  . naproxen (NAPROSYN) 500 MG tablet TAKE 1 TABLET TWICE A DAY AS NEEDED FOR PAIN  60 tablet  1   No current facility-administered medications on file prior to visit.   BP 110/70  Pulse 76  Temp(Src) 98.1  F (36.7 C) (Oral)  Resp 16  Wt 229 lb (103.874 kg)   Review of Systems  Constitutional: Negative for fever, chills, activity change, appetite change, fatigue and unexpected weight change.  HENT: Negative for congestion, mouth sores and sinus pressure.   Eyes: Negative for visual disturbance.  Respiratory: Negative for cough and chest tightness.   Gastrointestinal: Negative for nausea and abdominal pain.  Genitourinary: Negative for frequency, difficulty urinating and vaginal pain.  Musculoskeletal: Positive for gait problem. Negative for back pain and myalgias.  Skin: Negative for pallor and rash.  Neurological: Negative for dizziness, tremors, weakness, numbness and headaches.  Psychiatric/Behavioral: Negative for confusion and sleep disturbance. The patient is nervous/anxious.    Wt Readings from Last 3 Encounters:  10/29/13 229 lb (103.874 kg)  08/27/13 225 lb (102.059 kg)  08/21/13 225 lb (102.059 kg)   BP Readings from Last 3 Encounters:  10/29/13 110/70  08/27/13 112/68  08/21/13 132/60       Objective:   Physical Exam  Constitutional: She appears well-developed. No distress.  HENT:  Head: Normocephalic.  Right Ear: External ear normal.  Left Ear: External ear normal.  Nose: Nose normal.  Mouth/Throat: Oropharynx is clear and moist.  Eyes: Conjunctivae are normal. Pupils are equal, round, and reactive to light. Right eye exhibits no discharge. Left eye exhibits no discharge.  Neck: Normal range of motion. Neck supple. No JVD present. No tracheal deviation present. No thyromegaly present.  Cardiovascular: Normal rate, regular rhythm and normal heart sounds.   Pulmonary/Chest: No stridor. No respiratory distress. She has no wheezes.  Abdominal: Soft. Bowel sounds are normal. She exhibits no distension and no mass. There is no tenderness. There is no rebound and no guarding.  Musculoskeletal: She exhibits no edema and no tenderness.  Lymphadenopathy:    She has no  cervical adenopathy.  Neurological: She displays abnormal reflex. No cranial nerve deficit. She exhibits normal muscle tone. Coordination abnormal.  Skin: No rash noted. No erythema.  Psychiatric: She has a normal mood and affect. Her behavior is normal. Judgment and thought content normal.   LS is less tender  Lab Results  Component Value Date   WBC 4.9 09/06/2011   HGB 14.5 09/06/2011   HCT 45.2 09/06/2011   PLT 186 09/06/2011  GLUCOSE 89 06/25/2013   CHOL 226* 05/16/2012   TRIG 123.0 05/16/2012   HDL 50.30 05/16/2012   LDLDIRECT 159.1 05/16/2012   LDLCALC 107* 04/04/2009   ALT 17 08/15/2012   AST 17 08/15/2012   NA 137 06/25/2013   K 4.1 06/25/2013   CL 97 06/25/2013   CREATININE 1.0 06/25/2013   BUN 15 06/25/2013   CO2 32 06/25/2013   TSH 1.62 06/25/2013   INR 1.07 09/06/2011   HGBA1C 5.8 06/25/2013      MRI 1/13: IMPRESSION:  1. Severe multifactorial spinal and lateral recess stenosis at L4-  L5 plus moderate right foraminal stenosis.  2. Borderline to mild multifactorial spinal stenosis at T11-T12  and L3-L4.  3. Mild to moderate multifactorial left L5 foraminal stenosis.   Original Report Authenticated By: Randall An, M.D. Clinical Data: [place]. Hit head.    CT HEAD WITHOUT CONTRAST 12/11 CT CERVICAL SPINE WITHOUT CONTRAST  Technique: Multidetector CT imaging of the head and cervical spine  was performed following the standard protocol without intravenous  contrast. Multiplanar CT image reconstructions of the cervical  spine were also generated.  Comparison: None  CT HEAD  Findings: There is mild age related cerebral atrophy and  ventriculomegaly. No extra-axial fluid collections are seen. No  CT findings for hemispheric infarction or intracranial hemorrhage.  No mass lesions. The brainstem and cerebellum are unremarkable.  The bony calvarium is intact. Benign appearing calvarial lesion  noted in the right frontal area. The paranasal sinuses and mastoid  air cells are  clear. The globes are intact.  IMPRESSION:  1. Age related cerebral atrophy and ventriculomegaly.  2. No acute intracranial findings or mass lesions.  3. No skull fracture.  CT CERVICAL SPINE  Findings: Advanced degenerative cervical spondylosis with disc  disease and facet disease most notable at C4-5, C5-6 and C6-7. The  facets are normally aligned. No facet or laminar fractures. No  acute vertebral body fractures or abnormal prevertebral soft tissue  swelling. The C1-2 articulations are maintained. The dens appears  normal.  No large disc protrusions are seen. There is moderate osteophytic  spurring posteriorly at C5-6 and C6-7 with mild mass effect on the  thecal sac. Mild bilateral foraminal stenosis at C5-6 and C6-7.  IMPRESSION:  1. Degenerative cervical spondylosis with disc disease and facet  disease.  2. No acute bony findings and normal alignment.   Provider: Durene Fruits   Lab Results  Component Value Date   WBC 4.9 09/06/2011   HGB 14.5 09/06/2011   HCT 45.2 09/06/2011   PLT 186 09/06/2011   GLUCOSE 89 06/25/2013   CHOL 226* 05/16/2012   TRIG 123.0 05/16/2012   HDL 50.30 05/16/2012   LDLDIRECT 159.1 05/16/2012   LDLCALC 107* 04/04/2009   ALT 17 08/15/2012   AST 17 08/15/2012   NA 137 06/25/2013   K 4.1 06/25/2013   CL 97 06/25/2013   CREATININE 1.0 06/25/2013   BUN 15 06/25/2013   CO2 32 06/25/2013   TSH 1.62 06/25/2013   INR 1.07 09/06/2011   HGBA1C 5.8 06/25/2013        Assessment & Plan:

## 2013-11-25 ENCOUNTER — Other Ambulatory Visit: Payer: Self-pay | Admitting: Internal Medicine

## 2013-12-03 ENCOUNTER — Ambulatory Visit: Payer: Medicare Other | Admitting: Nurse Practitioner

## 2013-12-07 DIAGNOSIS — Z0279 Encounter for issue of other medical certificate: Secondary | ICD-10-CM

## 2013-12-12 ENCOUNTER — Telehealth: Payer: Self-pay | Admitting: *Deleted

## 2013-12-12 NOTE — Telephone Encounter (Signed)
Received called from [facility] stating the form md filled out for personal care services was not filled put completely. Needing md to fill in all blanks in sectipn 3 & 4. Also in on the last page in the change of status md has to complete in order for pt to continue getting all her service. Peter Congo has fax the originally form to be completed. Inform her will follow-up and have md to sign & fax back...[name]

## 2013-12-13 NOTE — Telephone Encounter (Signed)
Completed additional information on forms was faxed back to [facility] yesterday 12/12/13....[name]

## 2014-01-02 ENCOUNTER — Ambulatory Visit (INDEPENDENT_AMBULATORY_CARE_PROVIDER_SITE_OTHER): Payer: Medicare Other | Admitting: Internal Medicine

## 2014-01-02 ENCOUNTER — Encounter: Payer: Self-pay | Admitting: Internal Medicine

## 2014-01-02 VITALS — BP 110/64 | HR 68 | Temp 98.3°F | Resp 16 | Wt 231.0 lb

## 2014-01-02 DIAGNOSIS — I10 Essential (primary) hypertension: Secondary | ICD-10-CM

## 2014-01-02 DIAGNOSIS — Z9181 History of falling: Secondary | ICD-10-CM

## 2014-01-02 DIAGNOSIS — B079 Viral wart, unspecified: Secondary | ICD-10-CM

## 2014-01-02 DIAGNOSIS — M79605 Pain in left leg: Secondary | ICD-10-CM

## 2014-01-02 DIAGNOSIS — M79604 Pain in right leg: Secondary | ICD-10-CM

## 2014-01-02 DIAGNOSIS — M7711 Lateral epicondylitis, right elbow: Secondary | ICD-10-CM | POA: Insufficient documentation

## 2014-01-02 DIAGNOSIS — M545 Low back pain, unspecified: Secondary | ICD-10-CM | POA: Diagnosis not present

## 2014-01-02 DIAGNOSIS — J45909 Unspecified asthma, uncomplicated: Secondary | ICD-10-CM

## 2014-01-02 DIAGNOSIS — F3289 Other specified depressive episodes: Secondary | ICD-10-CM

## 2014-01-02 DIAGNOSIS — M7710 Lateral epicondylitis, unspecified elbow: Secondary | ICD-10-CM | POA: Diagnosis not present

## 2014-01-02 DIAGNOSIS — R296 Repeated falls: Secondary | ICD-10-CM

## 2014-01-02 DIAGNOSIS — L570 Actinic keratosis: Secondary | ICD-10-CM

## 2014-01-02 DIAGNOSIS — F329 Major depressive disorder, single episode, unspecified: Secondary | ICD-10-CM

## 2014-01-02 NOTE — Patient Instructions (Signed)
   Postprocedure instructions :     Keep the wounds clean. You can wash them with liquid soap and water. Pat dry with gauze or a Kleenex tissue  Before applying antibiotic ointment and a Band-Aid.   You need to report immediately  if  any signs of infection develop.    

## 2014-01-02 NOTE — Assessment & Plan Note (Signed)
A little better 

## 2014-01-02 NOTE — Progress Notes (Signed)
Pre visit review using our clinic review tool, if applicable. No additional management support is needed unless otherwise documented below in the visit note. 

## 2014-01-02 NOTE — Assessment & Plan Note (Signed)
8/15 post-traumatic Will inject

## 2014-01-02 NOTE — Progress Notes (Signed)
Subjective:     HPI   F/u OSA. Unable to loose wt. Wants to try CPAP.    F/u cramps in hands F/u memory issues - same No falls F/u SOB at night, ?snoring; tired in am No CP F/u hemorrhoid w/bleeding - resolved. It was painful, bled a lot Less pain today She had a colonoscopy 7-8 y ago with Dr Collene Mares  F/u worse LBP - better; gained wt   Past Medical History  Diagnosis Date  . Allergy     rhinitis  . Asthma   . Hyperlipidemia   . Hypertension   . Depression   . GERD (gastroesophageal reflux disease)   . Osteoarthritis   . Obesity   . Urinary incontinence   . Shoulder injury     left  . COPD (chronic obstructive pulmonary disease)   . Osteoporosis   . Gallstones   . Peripheral vascular disease     swelling   . Shortness of breath     with exertion   . Pneumonia   . Sleep apnea     never diagnosed   . Chronic kidney disease     hx of cystitis   . Dizziness     vertigo    Past Surgical History  Procedure Laterality Date  . Right left knee arthroplast    . L tkr    . Other surgical history      left leg surgery   . Other surgical history      left breast surgery due to mastitis   . Liver biopsy  09/14/2011    Procedure: LIVER BIOPSY;  Surgeon: Odis Hollingshead, MD;  Location: [facility];  Service: General;  Laterality: N/A;  . Cholecystectomy  09/14/2011    Procedure: LAPAROSCOPIC CHOLECYSTECTOMY;  Surgeon: Odis Hollingshead, MD;  Location: [facility];  Service: General;  Laterality: N/A;  attempted intraoperative cholangiogram     reports that she quit smoking about 12 years ago. Her smoking use included Cigarettes. She has a 17.5 pack-year smoking history. She has never used smokeless tobacco. She reports that she drinks alcohol. She reports that she does not use illicit drugs. family history includes Arthritis in her mother; Diabetes in her mother; Hypertension in her father, mother, and other; Stroke in her father and mother. There is no history of  CAD. Allergies  Allergen Reactions  . Atorvastatin     REACTION: upset stomach  . Enalapril Maleate   . Hctz [Hydrochlorothiazide]     Dizzy   . Lovastatin     REACTION: tongue stress  . Simvastatin     REACTION: mouth sores   Current Outpatient Prescriptions on File Prior to Visit  Medication Sig Dispense Refill  . aspirin 81 MG tablet Take 81 mg by mouth every morning.       . B Complex-C (SUPER B COMPLEX PO) Take 1 tablet by mouth every morning.       . Cholecalciferol 1000 UNITS capsule Take 1,000 Units by mouth every morning.       . clonazePAM (KLONOPIN) 0.25 MG disintegrating tablet Take 1 tablet (0.25 mg total) by mouth 2 (two) times daily as needed (insomnia or anxiety). Insomnia  60 tablet  2  . FLUoxetine (PROZAC) 10 MG tablet TAKE 2 TABLETS BY MOUTH DAILY  60 tablet  5  . hydrocortisone (ANUSOL-HC) 2.5 % rectal cream Use bid perianal  30 g  1  . hydrocortisone (ANUSOL-HC) 25 MG suppository Place 1 suppository (25 mg total)  rectally 2 (two) times daily.  20 suppository  1  . indapamide (LOZOL) 1.25 MG tablet TAKE 1 TABLET BY MOUTH EVERY MORNING FOR SWELLING  30 tablet  5  . loratadine (CLARITIN) 10 MG tablet Take 10 mg by mouth every morning.      . mometasone-formoterol (DULERA) 200-5 MCG/ACT AERO Inhale 2 puffs into the lungs 1 day or 1 dose.  1 Inhaler  11  . naproxen (NAPROSYN) 500 MG tablet TAKE 1 TABLET TWICE A DAY AS NEEDED FOR PAIN  60 tablet  1  . Omega-3 Fatty Acids (FISH OIL) 1000 MG CAPS Take 1 capsule by mouth every morning.       Marland Kitchen omeprazole (PRILOSEC) 20 MG capsule TAKE 2 CAPSULES BY MOUTH DAILY  60 capsule  3  . potassium chloride (KLOR-CON 10) 10 MEQ tablet Take 1 tablet (10 mEq total) by mouth daily.  30 tablet  11  . traMADol (ULTRAM) 50 MG tablet TAKE 1 TABLET BY MOUTH EVERY 8 HOURS AS NEEDED FOR PAIN  100 tablet  2   No current facility-administered medications on file prior to visit.   BP 110/64  Pulse 68  Temp(Src) 98.3 F (36.8 C) (Oral)  Resp  16  Wt 231 lb (104.781 kg)   Review of Systems  Constitutional: Negative for fever, chills, activity change, appetite change, fatigue and unexpected weight change.  HENT: Negative for congestion, mouth sores and sinus pressure.   Eyes: Negative for visual disturbance.  Respiratory: Negative for cough and chest tightness.   Gastrointestinal: Negative for nausea and abdominal pain.  Genitourinary: Negative for frequency, difficulty urinating and vaginal pain.  Musculoskeletal: Positive for gait problem. Negative for back pain and myalgias.  Skin: Negative for pallor and rash.  Neurological: Negative for dizziness, tremors, weakness, numbness and headaches.  Psychiatric/Behavioral: Negative for confusion and sleep disturbance. The patient is nervous/anxious.    Wt Readings from Last 3 Encounters:  01/02/14 231 lb (104.781 kg)  10/29/13 229 lb (103.874 kg)  08/27/13 225 lb (102.059 kg)   BP Readings from Last 3 Encounters:  01/02/14 110/64  10/29/13 110/70  08/27/13 112/68       Objective:   Physical Exam  Constitutional: She appears well-developed. No distress.  HENT:  Head: Normocephalic.  Right Ear: External ear normal.  Left Ear: External ear normal.  Nose: Nose normal.  Mouth/Throat: Oropharynx is clear and moist.  Eyes: Conjunctivae are normal. Pupils are equal, round, and reactive to light. Right eye exhibits no discharge. Left eye exhibits no discharge.  Neck: Normal range of motion. Neck supple. No JVD present. No tracheal deviation present. No thyromegaly present.  Cardiovascular: Normal rate, regular rhythm and normal heart sounds.   Pulmonary/Chest: No stridor. No respiratory distress. She has no wheezes.  Abdominal: Soft. Bowel sounds are normal. She exhibits no distension and no mass. There is no tenderness. There is no rebound and no guarding.  Musculoskeletal: She exhibits no edema and no tenderness.  Lymphadenopathy:    She has no cervical adenopathy.   Neurological: She displays abnormal reflex. No cranial nerve deficit. She exhibits normal muscle tone. Coordination abnormal.  Skin: No rash noted. No erythema.  Psychiatric: She has a normal mood and affect. Her behavior is normal. Judgment and thought content normal.   LS is less tender  Lab Results  Component Value Date   WBC 4.9 09/06/2011   HGB 14.5 09/06/2011   HCT 45.2 09/06/2011   PLT 186 09/06/2011   GLUCOSE 89 06/25/2013  CHOL 226* 05/16/2012   TRIG 123.0 05/16/2012   HDL 50.30 05/16/2012   LDLDIRECT 159.1 05/16/2012   LDLCALC 107* 04/04/2009   ALT 17 08/15/2012   AST 17 08/15/2012   NA 137 06/25/2013   K 4.1 06/25/2013   CL 97 06/25/2013   CREATININE 1.0 06/25/2013   BUN 15 06/25/2013   CO2 32 06/25/2013   TSH 1.62 06/25/2013   INR 1.07 09/06/2011   HGBA1C 5.8 06/25/2013      MRI 1/13: IMPRESSION:  1. Severe multifactorial spinal and lateral recess stenosis at L4-  L5 plus moderate right foraminal stenosis.  2. Borderline to mild multifactorial spinal stenosis at T11-T12  and L3-L4.  3. Mild to moderate multifactorial left L5 foraminal stenosis.   Original Report Authenticated By: Randall An, M.D. Clinical Data: [place]. Hit head.    CT HEAD WITHOUT CONTRAST 12/11 CT CERVICAL SPINE WITHOUT CONTRAST  Technique: Multidetector CT imaging of the head and cervical spine  was performed following the standard protocol without intravenous  contrast. Multiplanar CT image reconstructions of the cervical  spine were also generated.  Comparison: None  CT HEAD  Findings: There is mild age related cerebral atrophy and  ventriculomegaly. No extra-axial fluid collections are seen. No  CT findings for hemispheric infarction or intracranial hemorrhage.  No mass lesions. The brainstem and cerebellum are unremarkable.  The bony calvarium is intact. Benign appearing calvarial lesion  noted in the right frontal area. The paranasal sinuses and mastoid  air cells are clear. The globes are  intact.  IMPRESSION:  1. Age related cerebral atrophy and ventriculomegaly.  2. No acute intracranial findings or mass lesions.  3. No skull fracture.  CT CERVICAL SPINE  Findings: Advanced degenerative cervical spondylosis with disc  disease and facet disease most notable at C4-5, C5-6 and C6-7. The  facets are normally aligned. No facet or laminar fractures. No  acute vertebral body fractures or abnormal prevertebral soft tissue  swelling. The C1-2 articulations are maintained. The dens appears  normal.  No large disc protrusions are seen. There is moderate osteophytic  spurring posteriorly at C5-6 and C6-7 with mild mass effect on the  thecal sac. Mild bilateral foraminal stenosis at C5-6 and C6-7.  IMPRESSION:  1. Degenerative cervical spondylosis with disc disease and facet  disease.  2. No acute bony findings and normal alignment.   Provider: Durene Fruits   Lab Results  Component Value Date   WBC 4.9 09/06/2011   HGB 14.5 09/06/2011   HCT 45.2 09/06/2011   PLT 186 09/06/2011   GLUCOSE 89 06/25/2013   CHOL 226* 05/16/2012   TRIG 123.0 05/16/2012   HDL 50.30 05/16/2012   LDLDIRECT 159.1 05/16/2012   LDLCALC 107* 04/04/2009   ALT 17 08/15/2012   AST 17 08/15/2012   NA 137 06/25/2013   K 4.1 06/25/2013   CL 97 06/25/2013   CREATININE 1.0 06/25/2013   BUN 15 06/25/2013   CO2 32 06/25/2013   TSH 1.62 06/25/2013   INR 1.07 09/06/2011   HGBA1C 5.8 06/25/2013     Procedure Note :     Procedure : Cryosurgery   Indication:  Wart(s)   Risks including unsuccessful procedure , bleeding, infection, bruising, scar, a need for a repeat  procedure and others were explained to the patient in detail as well as the benefits. Informed consent was obtained verbally.    1 lesion(s)  on  L thigh  was/were treated with liquid nitrogen on a Q-tip in a  usual fasion . Band-Aid was applied and antibiotic ointment was given for a later use.   Tolerated well. Complications none.   Procedure: tennis elbow  steroid injection  Indication: R elbow lateral epicondylitis  Risks including bleeding, infection, unsuccessful procedure and others were explained to the patient in detail. The pt agreed to proceed. The patient was placed in the decubitus position. The area was prepped with betadine and alcohol. The injection was carried out with a mixture of 20 mg of Depomedrol and 2 cc of Lidocaine 2%. Band-Aid applied.  Tolerated well. Complications - none. Post-procedure instructions were provided.         Assessment & Plan:

## 2014-01-03 MED ORDER — METHYLPREDNISOLONE ACETATE 40 MG/ML IJ SUSP
40.0000 mg | Freq: Once | INTRAMUSCULAR | Status: AC
Start: 2014-01-03 — End: 2014-01-02
  Administered 2014-01-02: 40 mg via INTRA_ARTICULAR

## 2014-01-06 ENCOUNTER — Encounter: Payer: Self-pay | Admitting: Internal Medicine

## 2014-01-06 NOTE — Assessment & Plan Note (Signed)
Continue with current prescription therapy as reflected on the Med list.  

## 2014-01-06 NOTE — Assessment & Plan Note (Signed)
Better  

## 2014-01-27 ENCOUNTER — Other Ambulatory Visit: Payer: Self-pay | Admitting: Internal Medicine

## 2014-02-08 ENCOUNTER — Encounter: Payer: Self-pay | Admitting: Pulmonary Disease

## 2014-02-08 ENCOUNTER — Ambulatory Visit (INDEPENDENT_AMBULATORY_CARE_PROVIDER_SITE_OTHER): Payer: Medicare Other | Admitting: Pulmonary Disease

## 2014-02-08 VITALS — BP 138/84 | HR 88 | Temp 98.2°F | Ht 64.96 in | Wt 234.8 lb

## 2014-02-08 DIAGNOSIS — G4733 Obstructive sleep apnea (adult) (pediatric): Secondary | ICD-10-CM | POA: Diagnosis not present

## 2014-02-08 NOTE — Progress Notes (Signed)
Subjective:    Patient ID: Sabrina Page, female    DOB: 1933-12-20, 80 y.o.   MRN: [id number]  HPI The patient is a 80 year old female who I've been asked to see for obstructive sleep apnea. She has had a recent home sleep test, where she was found to have mild to moderate OSA with an AHI of 19 events per hour and oxygen desaturation as low as 78%. The patient is unsure if she snores, but does have choking arousals and breathing issues at night. She has frequent awakenings at night, and is not rested in the mornings upon arising. She feels that she is alert in the mornings, but is not refreshed. She does have periods during the day of sleepiness, but does not have issues at night because she stays extremely busy.  She currently does not drive. The patient has gained over 20 pounds in the last 2 years, and her Epworth score today is 8   Sleep Questionnaire What time do you typically go to bed?( Between what hours) 10-11pm 10-11pm at 1521 on 02/08/14 by Lilli Few, CMA How long does it take you to fall asleep? 20 minutes 20 minutes at 1521 on 02/08/14 by Lilli Few, CMA How many times during the night do you wake up? 3 3 at 1521 on 02/08/14 by Lilli Few, CMA What time do you get out of bed to start your day? 0600 0600 at 1521 on 02/08/14 by Lilli Few, CMA Do you drive or operate heavy machinery in your occupation? No No at 1521 on 02/08/14 by Lilli Few, CMA How much has your weight changed (up or down) over the past two years? (In pounds) 20 lb (9.072 kg) 20 lb (9.072 kg) at 1521 on 02/08/14 by Lilli Few, CMA Have you ever had a sleep study before? Yes Yes at 1521 on 02/08/14 by Lilli Few, CMA If yes, location of study? [place] [place] [facility] [place] on 02/08/14 by Lilli Few, CMA If yes, date of study? 2000? 2000? at 1521 on 02/08/14 by Lilli Few, CMA Do you currently use CPAP?  No No at 1521 on 02/08/14 by Lilli Few, CMA Do you wear oxygen at any time? No No at 1521 on 02/08/14 by Lilli Few, CMA   Review of Systems  Constitutional: Negative for fever and unexpected weight change.  HENT: Negative for congestion, dental problem, ear pain, nosebleeds, postnasal drip, rhinorrhea, sinus pressure, sneezing, sore throat and trouble swallowing.   Eyes: Negative for redness and itching.  Respiratory: Positive for shortness of breath. Negative for cough, chest tightness and wheezing.   Cardiovascular: Negative for palpitations and leg swelling.  Gastrointestinal: Negative for nausea and vomiting.  Genitourinary: Negative for dysuria.  Musculoskeletal: Negative for joint swelling.  Skin: Negative for rash.  Neurological: Negative for headaches.  Hematological: Does not bruise/bleed easily.  Psychiatric/Behavioral: Negative for dysphoric mood. The patient is not nervous/anxious.        Objective:   Physical Exam Constitutional:  Obese female, no acute distress  HENT:  Nares patent without discharge  Oropharynx without exudate, palate and uvula thick and elongated with small posterior pharyngeal space.   Eyes:  Perrla, eomi, no scleral icterus  Neck:  No JVD, no TMG  Cardiovascular:  Normal rate, regular rhythm, no rubs or gallops.  No murmurs        Intact distal pulses  Pulmonary :  Normal breath sounds, no stridor or respiratory  distress   No rales, rhonchi, or wheezing  Abdominal:  Soft, nondistended, bowel sounds present.  No tenderness noted.   Musculoskeletal:  2+ lower extremity edema noted, + varicosities  Lymph Nodes:  No cervical lymphadenopathy noted  Skin:  No cyanosis noted  Neurologic:  Alert, appropriate, moves all 4 extremities without obvious deficit.         Assessment & Plan:

## 2014-02-08 NOTE — Assessment & Plan Note (Signed)
The patient has mild to moderate obstructive sleep apnea by her recent home sleep test, and is clearly symptomatic during the night as well as during the day. I have discussed with her the pathophysiology of sleep apnea, including its impact to her quality of life and cardiovascular health. I think she would benefit from a trial of CPAP while working on weight loss. I will set the patient up on cpap at a moderate pressure level to allow for desensitization, and will troubleshoot the device over the next 4-6weeks if needed.  The pt is to call me if having issues with tolerance.  Will then optimize the pressure once patient is able to wear cpap on a consistent basis.

## 2014-02-08 NOTE — Patient Instructions (Addendum)
Will start on cpap at a moderate pressure level.  Please call if having issues with tolerance. Work on weight loss followup with me again in 8 weeks.

## 2014-02-25 ENCOUNTER — Other Ambulatory Visit: Payer: Self-pay | Admitting: Internal Medicine

## 2014-03-04 DIAGNOSIS — N3941 Urge incontinence: Secondary | ICD-10-CM | POA: Diagnosis not present

## 2014-03-06 ENCOUNTER — Ambulatory Visit (INDEPENDENT_AMBULATORY_CARE_PROVIDER_SITE_OTHER): Payer: Medicare Other | Admitting: Internal Medicine

## 2014-03-06 ENCOUNTER — Encounter: Payer: Self-pay | Admitting: Internal Medicine

## 2014-03-06 VITALS — BP 128/72 | HR 85 | Temp 97.9°F | Wt 235.0 lb

## 2014-03-06 DIAGNOSIS — Z23 Encounter for immunization: Secondary | ICD-10-CM

## 2014-03-06 DIAGNOSIS — M79604 Pain in right leg: Secondary | ICD-10-CM

## 2014-03-06 DIAGNOSIS — R739 Hyperglycemia, unspecified: Secondary | ICD-10-CM

## 2014-03-06 DIAGNOSIS — I10 Essential (primary) hypertension: Secondary | ICD-10-CM | POA: Diagnosis not present

## 2014-03-06 DIAGNOSIS — G4733 Obstructive sleep apnea (adult) (pediatric): Secondary | ICD-10-CM | POA: Diagnosis not present

## 2014-03-06 DIAGNOSIS — M545 Low back pain: Secondary | ICD-10-CM

## 2014-03-06 DIAGNOSIS — J4540 Moderate persistent asthma, uncomplicated: Secondary | ICD-10-CM | POA: Diagnosis not present

## 2014-03-06 DIAGNOSIS — M79605 Pain in left leg: Secondary | ICD-10-CM

## 2014-03-06 DIAGNOSIS — Z0000 Encounter for general adult medical examination without abnormal findings: Secondary | ICD-10-CM

## 2014-03-06 LAB — GLUCOSE, POCT (MANUAL RESULT ENTRY): POC Glucose: 95 mg/dl (ref 70–99)

## 2014-03-06 MED ORDER — CLONAZEPAM 0.25 MG PO TBDP: 0.2500 mg | ORAL_TABLET | Freq: Two times a day (BID) | ORAL | Status: DC | PRN

## 2014-03-06 MED ORDER — TRAMADOL HCL 50 MG PO TABS: ORAL_TABLET | ORAL | Status: DC

## 2014-03-06 MED ORDER — LORATADINE 10 MG PO TABS: 10.0000 mg | ORAL_TABLET | Freq: Every morning | ORAL | Status: DC

## 2014-03-06 NOTE — Assessment & Plan Note (Signed)
Continue with current prescription therapy as reflected on the Med list.  

## 2014-03-06 NOTE — Assessment & Plan Note (Signed)
Wt Readings from Last 3 Encounters:  03/06/14 235 lb (106.595 kg)  02/08/14 234 lb 12.8 oz (106.505 kg)  01/02/14 231 lb (104.781 kg)

## 2014-03-06 NOTE — Progress Notes (Signed)
Pre visit review using our clinic review tool, if applicable. No additional management support is needed unless otherwise documented below in the visit note. 

## 2014-03-06 NOTE — Assessment & Plan Note (Signed)
Encouraged to use mask more

## 2014-03-06 NOTE — Progress Notes (Signed)
Subjective:     HPI   F/u OSA. Unable to loose wt. Trying CPAP. C/o fatigue    F/u cramps in hands F/u memory issues - same No falls F/u SOB at night, ?snoring; tired in am No CP F/u hemorrhoid w/bleeding - resolved. It was painful, bled a lot Less pain today She had a colonoscopy 7-8 y ago with Dr Collene Mares  F/u worse LBP - better; gained wt   Past Medical History  Diagnosis Date  . Allergy     rhinitis  . Asthma   . Hyperlipidemia   . Hypertension   . Depression   . GERD (gastroesophageal reflux disease)   . Osteoarthritis   . Obesity   . Urinary incontinence   . Shoulder injury     left  . COPD (chronic obstructive pulmonary disease)   . Osteoporosis   . Gallstones   . Peripheral vascular disease     swelling   . Shortness of breath     with exertion   . Pneumonia   . Sleep apnea     never diagnosed   . Chronic kidney disease     hx of cystitis   . Dizziness     vertigo    Past Surgical History  Procedure Laterality Date  . Right left knee arthroplast    . L tkr    . Other surgical history      left leg surgery   . Other surgical history      left breast surgery due to mastitis   . Liver biopsy  09/14/2011    Procedure: LIVER BIOPSY;  Surgeon: Odis Hollingshead, MD;  Location: [facility];  Service: General;  Laterality: N/A;  . Cholecystectomy  09/14/2011    Procedure: LAPAROSCOPIC CHOLECYSTECTOMY;  Surgeon: Odis Hollingshead, MD;  Location: [facility];  Service: General;  Laterality: N/A;  attempted intraoperative cholangiogram     reports that she quit smoking about 12 years ago. Her smoking use included Cigarettes. She has a 17.5 pack-year smoking history. She has never used smokeless tobacco. She reports that she drinks alcohol. She reports that she does not use illicit drugs. family history includes Arthritis in her mother; Diabetes in her mother; Hypertension in her father, mother, and other; Stroke in her father and mother. There is no history of  CAD. Allergies  Allergen Reactions  . Atorvastatin     REACTION: upset stomach  . Enalapril Maleate   . Hctz [Hydrochlorothiazide]     Dizzy   . Lovastatin     REACTION: tongue stress  . Simvastatin     REACTION: mouth sores   Current Outpatient Prescriptions on File Prior to Visit  Medication Sig Dispense Refill  . aspirin 81 MG tablet Take 81 mg by mouth every morning.       . B Complex-C (SUPER B COMPLEX PO) Take 1 tablet by mouth every morning.       . Cholecalciferol 1000 UNITS capsule Take 1,000 Units by mouth every morning.       . clonazePAM (KLONOPIN) 0.25 MG disintegrating tablet Take 1 tablet (0.25 mg total) by mouth 2 (two) times daily as needed (insomnia or anxiety). Insomnia  60 tablet  2  . FLUoxetine (PROZAC) 10 MG tablet TAKE 2 TABLETS BY MOUTH DAILY  60 tablet  5  . hydrocortisone (ANUSOL-HC) 2.5 % rectal cream Use bid perianal  30 g  1  . hydrocortisone (ANUSOL-HC) 25 MG suppository Place 1 suppository (25 mg total)  rectally 2 (two) times daily.  20 suppository  1  . indapamide (LOZOL) 1.25 MG tablet TAKE 1 TABLET BY MOUTH EVERY MORNING FOR SWELLING  30 tablet  5  . loratadine (CLARITIN) 10 MG tablet Take 10 mg by mouth every morning.      . mometasone-formoterol (DULERA) 200-5 MCG/ACT AERO Inhale 2 puffs into the lungs 1 day or 1 dose.  1 Inhaler  11  . naproxen (NAPROSYN) 500 MG tablet TAKE 1 TABLET TWICE A DAY AS NEEDED FOR PAIN  60 tablet  1  . Omega-3 Fatty Acids (FISH OIL) 1000 MG CAPS Take 1 capsule by mouth every morning.       Marland Kitchen omeprazole (PRILOSEC) 20 MG capsule TAKE 2 CAPSULES BY MOUTH DAILY  60 capsule  3  . omeprazole (PRILOSEC) 20 MG capsule TAKE 2 CAPSULES BY MOUTH DAILY  60 capsule  5  . potassium chloride (KLOR-CON 10) 10 MEQ tablet Take 1 tablet (10 mEq total) by mouth daily.  30 tablet  11  . traMADol (ULTRAM) 50 MG tablet TAKE 1 TABLET BY MOUTH EVERY 8 HOURS AS NEEDED FOR PAIN  100 tablet  2   No current facility-administered medications on  file prior to visit.   BP 128/72  Pulse 85  Temp(Src) 97.9 F (36.6 C) (Oral)  Wt 235 lb (106.595 kg)  SpO2 96%   Review of Systems  Constitutional: Negative for fever, chills, activity change, appetite change, fatigue and unexpected weight change.  HENT: Negative for congestion, mouth sores and sinus pressure.   Eyes: Negative for visual disturbance.  Respiratory: Negative for cough and chest tightness.   Gastrointestinal: Negative for nausea and abdominal pain.  Genitourinary: Negative for frequency, difficulty urinating and vaginal pain.  Musculoskeletal: Positive for gait problem. Negative for back pain and myalgias.  Skin: Negative for pallor and rash.  Neurological: Negative for dizziness, tremors, weakness, numbness and headaches.  Psychiatric/Behavioral: Negative for confusion and sleep disturbance. The patient is nervous/anxious.    Wt Readings from Last 3 Encounters:  03/06/14 235 lb (106.595 kg)  02/08/14 234 lb 12.8 oz (106.505 kg)  01/02/14 231 lb (104.781 kg)   BP Readings from Last 3 Encounters:  03/06/14 128/72  02/08/14 138/84  01/02/14 110/64       Objective:   Physical Exam  Constitutional: She appears well-developed. No distress.  HENT:  Head: Normocephalic.  Right Ear: External ear normal.  Left Ear: External ear normal.  Nose: Nose normal.  Mouth/Throat: Oropharynx is clear and moist.  Eyes: Conjunctivae are normal. Pupils are equal, round, and reactive to light. Right eye exhibits no discharge. Left eye exhibits no discharge.  Neck: Normal range of motion. Neck supple. No JVD present. No tracheal deviation present. No thyromegaly present.  Cardiovascular: Normal rate, regular rhythm and normal heart sounds.   Pulmonary/Chest: No stridor. No respiratory distress. She has no wheezes.  Abdominal: Soft. Bowel sounds are normal. She exhibits no distension and no mass. There is no tenderness. There is no rebound and no guarding.  Musculoskeletal: She  exhibits no edema and no tenderness.  Lymphadenopathy:    She has no cervical adenopathy.  Neurological: She displays abnormal reflex. No cranial nerve deficit. She exhibits normal muscle tone. Coordination abnormal.  Skin: No rash noted. No erythema.  Psychiatric: She has a normal mood and affect. Her behavior is normal. Judgment and thought content normal.   LS is less tender  Lab Results  Component Value Date   WBC 4.9 09/06/2011  HGB 14.5 09/06/2011   HCT 45.2 09/06/2011   PLT 186 09/06/2011   GLUCOSE 89 06/25/2013   CHOL 226* 05/16/2012   TRIG 123.0 05/16/2012   HDL 50.30 05/16/2012   LDLDIRECT 159.1 05/16/2012   LDLCALC 107* 04/04/2009   ALT 17 08/15/2012   AST 17 08/15/2012   NA 137 06/25/2013   K 4.1 06/25/2013   CL 97 06/25/2013   CREATININE 1.0 06/25/2013   BUN 15 06/25/2013   CO2 32 06/25/2013   TSH 1.62 06/25/2013   INR 1.07 09/06/2011   HGBA1C 5.8 06/25/2013      MRI 1/13: IMPRESSION:  1. Severe multifactorial spinal and lateral recess stenosis at L4-  L5 plus moderate right foraminal stenosis.  2. Borderline to mild multifactorial spinal stenosis at T11-T12  and L3-L4.  3. Mild to moderate multifactorial left L5 foraminal stenosis.   Original Report Authenticated By: Randall An, M.D. Clinical Data: [place]. Hit head.    CT HEAD WITHOUT CONTRAST 12/11 CT CERVICAL SPINE WITHOUT CONTRAST  Technique: Multidetector CT imaging of the head and cervical spine  was performed following the standard protocol without intravenous  contrast. Multiplanar CT image reconstructions of the cervical  spine were also generated.  Comparison: None  CT HEAD  Findings: There is mild age related cerebral atrophy and  ventriculomegaly. No extra-axial fluid collections are seen. No  CT findings for hemispheric infarction or intracranial hemorrhage.  No mass lesions. The brainstem and cerebellum are unremarkable.  The bony calvarium is intact. Benign appearing calvarial lesion  noted in the  right frontal area. The paranasal sinuses and mastoid  air cells are clear. The globes are intact.  IMPRESSION:  1. Age related cerebral atrophy and ventriculomegaly.  2. No acute intracranial findings or mass lesions.  3. No skull fracture.  CT CERVICAL SPINE  Findings: Advanced degenerative cervical spondylosis with disc  disease and facet disease most notable at C4-5, C5-6 and C6-7. The  facets are normally aligned. No facet or laminar fractures. No  acute vertebral body fractures or abnormal prevertebral soft tissue  swelling. The C1-2 articulations are maintained. The dens appears  normal.  No large disc protrusions are seen. There is moderate osteophytic  spurring posteriorly at C5-6 and C6-7 with mild mass effect on the  thecal sac. Mild bilateral foraminal stenosis at C5-6 and C6-7.  IMPRESSION:  1. Degenerative cervical spondylosis with disc disease and facet  disease.  2. No acute bony findings and normal alignment.   Provider: Durene Fruits   Lab Results  Component Value Date   WBC 4.9 09/06/2011   HGB 14.5 09/06/2011   HCT 45.2 09/06/2011   PLT 186 09/06/2011   GLUCOSE 89 06/25/2013   CHOL 226* 05/16/2012   TRIG 123.0 05/16/2012   HDL 50.30 05/16/2012   LDLDIRECT 159.1 05/16/2012   LDLCALC 107* 04/04/2009   ALT 17 08/15/2012   AST 17 08/15/2012   NA 137 06/25/2013   K 4.1 06/25/2013   CL 97 06/25/2013   CREATININE 1.0 06/25/2013   BUN 15 06/25/2013   CO2 32 06/25/2013   TSH 1.62 06/25/2013   INR 1.07 09/06/2011   HGBA1C 5.8 06/25/2013             Assessment & Plan:

## 2014-03-06 NOTE — Assessment & Plan Note (Signed)
Obstr/restr lung disease Pulmonary eval 08/31/2011 Wert>  Non-adherent with dulera    - Spirometry nl 08/31/2011 with no active symptoms   - hfa 75% effective p coaching.

## 2014-03-08 ENCOUNTER — Other Ambulatory Visit: Payer: Self-pay

## 2014-03-15 ENCOUNTER — Other Ambulatory Visit: Payer: Self-pay

## 2014-03-15 DIAGNOSIS — Z1231 Encounter for screening mammogram for malignant neoplasm of breast: Secondary | ICD-10-CM

## 2014-04-03 DIAGNOSIS — N3941 Urge incontinence: Secondary | ICD-10-CM | POA: Diagnosis not present

## 2014-04-05 ENCOUNTER — Encounter: Payer: Self-pay | Admitting: Pulmonary Disease

## 2014-04-05 ENCOUNTER — Ambulatory Visit (INDEPENDENT_AMBULATORY_CARE_PROVIDER_SITE_OTHER): Payer: Medicare Other | Admitting: Pulmonary Disease

## 2014-04-05 VITALS — BP 132/80 | HR 85 | Temp 98.0°F | Ht 64.0 in | Wt 239.6 lb

## 2014-04-05 DIAGNOSIS — G4733 Obstructive sleep apnea (adult) (pediatric): Secondary | ICD-10-CM

## 2014-04-05 NOTE — Patient Instructions (Signed)
Will refer you to the sleep center for a mask fitting session.  Hopefully this will help with tolerance.  Please bring your current mask with you to the session.  Work on weight loss. followup with me again in 39mos, but call if you are having a hard time wearing the device every night.

## 2014-04-05 NOTE — Assessment & Plan Note (Signed)
The patient has had very poor tolerance of C Pap, and in fact has only wore the device one time in the last 30 days. She does not feel that it is a pressure issue, but rather a mask fit issue.  I will arrange for her to have a fitting session over at the sleep Center, and hopefully this will resolve the issue. I have reviewed with her again through the interpreter the pathophysiology of sleep apnea, and why it is important to keep working with C Pap. If she continues to have issues, we can consider a dental appliance or just supplemental oxygen at night (although this is not optimal treatment for sleep apnea). I have also encouraged her to keep working on weight loss.

## 2014-04-05 NOTE — Progress Notes (Signed)
   Subjective:    Patient ID: Sabrina Page, female    DOB: 1934-02-11, 80 y.o.   MRN: [id number]  HPI Patient comes in today for follow-up of her obstructive sleep apnea. She was started on C Pap at the last visit, but has only worn one night out of the last 30.  Her main complaint is poor mask fit, and she is not going to be a little to wear her current mask. She does not feel the pressure was an issue for her.   Review of Systems  Constitutional: Negative for fever and unexpected weight change.  HENT: Negative for congestion, dental problem, ear pain, nosebleeds, postnasal drip, rhinorrhea, sinus pressure, sneezing, sore throat and trouble swallowing.   Eyes: Negative for redness and itching.  Respiratory: Negative for cough, chest tightness, shortness of breath and wheezing.   Cardiovascular: Negative for palpitations and leg swelling.  Gastrointestinal: Negative for nausea and vomiting.  Genitourinary: Negative for dysuria.  Musculoskeletal: Negative for joint swelling.  Skin: Negative for rash.  Neurological: Negative for headaches.  Hematological: Does not bruise/bleed easily.  Psychiatric/Behavioral: Negative for dysphoric mood. The patient is not nervous/anxious.        Objective:   Physical Exam Obese female in no acute distress Nose without purulence or discharge noted No skin breakdown or pressure necrosis from the C Pap mask Neck without lymphadenopathy or thyromegaly Lower extremities with mild edema, no cyanosis Alert and oriented, moves all 4 extremities.       Assessment & Plan:

## 2014-04-08 DIAGNOSIS — N3941 Urge incontinence: Secondary | ICD-10-CM | POA: Diagnosis not present

## 2014-04-12 ENCOUNTER — Ambulatory Visit
Admission: RE | Admit: 2014-04-12 | Discharge: 2014-04-12 | Disposition: A | Discharge status: Home / Self Care | Payer: Medicare Other | Source: Ambulatory Visit

## 2014-04-12 DIAGNOSIS — Z1231 Encounter for screening mammogram for malignant neoplasm of breast: Secondary | ICD-10-CM | POA: Diagnosis not present

## 2014-04-15 ENCOUNTER — Ambulatory Visit (HOSPITAL_BASED_OUTPATIENT_CLINIC_OR_DEPARTMENT_OTHER): Payer: Medicare Other | Attending: Pulmonary Disease | Admitting: Radiology

## 2014-04-15 DIAGNOSIS — G4733 Obstructive sleep apnea (adult) (pediatric): Secondary | ICD-10-CM

## 2014-04-15 DIAGNOSIS — Z9989 Dependence on other enabling machines and devices: Principal | ICD-10-CM

## 2014-06-12 ENCOUNTER — Other Ambulatory Visit (INDEPENDENT_AMBULATORY_CARE_PROVIDER_SITE_OTHER): Payer: Medicare Other

## 2014-06-12 ENCOUNTER — Ambulatory Visit (INDEPENDENT_AMBULATORY_CARE_PROVIDER_SITE_OTHER): Payer: Medicare Other | Admitting: Internal Medicine

## 2014-06-12 ENCOUNTER — Encounter: Payer: Self-pay | Admitting: Internal Medicine

## 2014-06-12 VITALS — BP 130/84 | HR 72 | Temp 98.2°F | Resp 16 | Ht 64.0 in | Wt 231.5 lb

## 2014-06-12 DIAGNOSIS — Z0000 Encounter for general adult medical examination without abnormal findings: Secondary | ICD-10-CM | POA: Diagnosis not present

## 2014-06-12 DIAGNOSIS — R739 Hyperglycemia, unspecified: Secondary | ICD-10-CM

## 2014-06-12 DIAGNOSIS — J4520 Mild intermittent asthma, uncomplicated: Secondary | ICD-10-CM | POA: Diagnosis not present

## 2014-06-12 DIAGNOSIS — I10 Essential (primary) hypertension: Secondary | ICD-10-CM | POA: Diagnosis not present

## 2014-06-12 DIAGNOSIS — F411 Generalized anxiety disorder: Secondary | ICD-10-CM | POA: Diagnosis not present

## 2014-06-12 DIAGNOSIS — M25511 Pain in right shoulder: Secondary | ICD-10-CM

## 2014-06-12 LAB — CBC WITH DIFFERENTIAL/PLATELET
Basophils Absolute: 0 10*3/uL (ref 0.0–0.1)
Basophils Relative: 0.5 % (ref 0.0–3.0)
Eosinophils Absolute: 0.2 10*3/uL (ref 0.0–0.7)
Eosinophils Relative: 2.5 % (ref 0.0–5.0)
HCT: 45.1 % (ref 36.0–46.0)
Hemoglobin: 15.3 g/dL — ABNORMAL HIGH (ref 12.0–15.0)
Lymphocytes Relative: 26.2 % (ref 12.0–46.0)
Lymphs Abs: 1.6 10*3/uL (ref 0.7–4.0)
MCHC: 33.8 g/dL (ref 30.0–36.0)
MCV: 86.4 fl (ref 78.0–100.0)
Monocytes Absolute: 0.4 10*3/uL (ref 0.1–1.0)
Monocytes Relative: 7.4 % (ref 3.0–12.0)
Neutro Abs: 3.8 10*3/uL (ref 1.4–7.7)
Neutrophils Relative %: 63.4 % (ref 43.0–77.0)
Platelets: 186 10*3/uL (ref 150.0–400.0)
RBC: 5.22 Mil/uL — ABNORMAL HIGH (ref 3.87–5.11)
RDW: 13.5 % (ref 11.5–15.5)
WBC: 6 10*3/uL (ref 4.0–10.5)

## 2014-06-12 LAB — BASIC METABOLIC PANEL
BUN: 18 mg/dL (ref 6–23)
CO2: 32 mEq/L (ref 19–32)
Calcium: 9.4 mg/dL (ref 8.4–10.5)
Chloride: 98 mEq/L (ref 96–112)
Creatinine, Ser: 1.04 mg/dL (ref 0.40–1.20)
GFR: 54.37 mL/min — ABNORMAL LOW (ref >60.00)
Glucose, Bld: 119 mg/dL — ABNORMAL HIGH (ref 70–99)
Potassium: 3.8 mEq/L (ref 3.5–5.1)
Sodium: 138 mEq/L (ref 135–145)

## 2014-06-12 LAB — LIPID PANEL
Cholesterol: 270 mg/dL — ABNORMAL HIGH (ref 0–200)
HDL: 52.4 mg/dL (ref >39.00)
LDL Cholesterol: 191 mg/dL — ABNORMAL HIGH (ref 0–99)
NonHDL: 217.6
Total CHOL/HDL Ratio: 5
Triglycerides: 132 mg/dL (ref 0.0–149.0)
VLDL: 26.4 mg/dL (ref 0.0–40.0)

## 2014-06-12 LAB — HEMOGLOBIN A1C: Hgb A1c MFr Bld: 5.9 % (ref 4.6–6.5)

## 2014-06-12 LAB — HEPATIC FUNCTION PANEL
ALT: 19 U/L (ref 0–35)
AST: 20 U/L (ref 0–37)
Albumin: 4 g/dL (ref 3.5–5.2)
Alkaline Phosphatase: 67 U/L (ref 39–117)
Bilirubin, Direct: 0.2 mg/dL (ref 0.0–0.3)
Total Bilirubin: 0.6 mg/dL (ref 0.2–1.2)
Total Protein: 7.2 g/dL (ref 6.0–8.3)

## 2014-06-12 LAB — URINALYSIS
Bilirubin Urine: NEGATIVE
Hgb urine dipstick: NEGATIVE
Ketones, ur: NEGATIVE
Leukocytes, UA: NEGATIVE
Nitrite: NEGATIVE
Specific Gravity, Urine: 1.02 (ref 1.000–1.030)
Total Protein, Urine: NEGATIVE
Urine Glucose: NEGATIVE
Urobilinogen, UA: 1 (ref 0.0–1.0)
pH: 6 (ref 5.0–8.0)

## 2014-06-12 LAB — TSH: TSH: 1.79 u[IU]/mL (ref 0.35–4.50)

## 2014-06-12 MED ORDER — TRAMADOL HCL 50 MG PO TABS
50.0000 mg | ORAL_TABLET | Freq: Four times a day (QID) | ORAL | Status: DC | PRN
Start: 2014-06-12 — End: 2014-09-13

## 2014-06-12 NOTE — Assessment & Plan Note (Signed)
Compensated Continue with current prescription therapy as reflected on the Med list.  On O2 at night

## 2014-06-12 NOTE — Assessment & Plan Note (Signed)
A little better   Wt Readings from Last 3 Encounters:  06/12/14 231 lb 8 oz (105.008 kg)  04/05/14 239 lb 9.6 oz (108.682 kg)  03/06/14 235 lb (106.595 kg)

## 2014-06-12 NOTE — Progress Notes (Signed)
Subjective:     HPI  C/o R shoulder pain w/ROM - worse F/u OSA. Unable to loose wt. Trying CPAP. C/o fatigue    F/u cramps in hands F/u memory issues - same No falls F/u SOB at night, ?snoring; tired in am No CP F/u hemorrhoid w/bleeding - resolved. It was painful, bled a lot Less pain today She had a colonoscopy 7-8 y ago with Dr Collene Mares  F/u worse LBP - better; gained wt   Past Medical History  Diagnosis Date  . Allergy     rhinitis  . Asthma   . Hyperlipidemia   . Hypertension   . Depression   . GERD (gastroesophageal reflux disease)   . Osteoarthritis   . Obesity   . Urinary incontinence   . Shoulder injury     left  . COPD (chronic obstructive pulmonary disease)   . Osteoporosis   . Gallstones   . Peripheral vascular disease     swelling   . Shortness of breath     with exertion   . Pneumonia   . Sleep apnea     never diagnosed   . Chronic kidney disease     hx of cystitis   . Dizziness     vertigo    Past Surgical History  Procedure Laterality Date  . Right left knee arthroplast    . L tkr    . Other surgical history      left leg surgery   . Other surgical history      left breast surgery due to mastitis   . Liver biopsy  09/14/2011    Procedure: LIVER BIOPSY;  Surgeon: Odis Hollingshead, MD;  Location: [facility];  Service: General;  Laterality: N/A;  . Cholecystectomy  09/14/2011    Procedure: LAPAROSCOPIC CHOLECYSTECTOMY;  Surgeon: Odis Hollingshead, MD;  Location: [facility];  Service: General;  Laterality: N/A;  attempted intraoperative cholangiogram     reports that she quit smoking about 13 years ago. Her smoking use included Cigarettes. She has a 17.5 pack-year smoking history. She has never used smokeless tobacco. She reports that she drinks alcohol. She reports that she does not use illicit drugs. family history includes Arthritis in her mother; Diabetes in her mother; Hypertension in her father, mother, and other; Stroke in her father and  mother. There is no history of CAD. Allergies  Allergen Reactions  . Atorvastatin     REACTION: upset stomach  . Enalapril Maleate   . Hctz [Hydrochlorothiazide]     Dizzy   . Lovastatin     REACTION: tongue stress  . Simvastatin     REACTION: mouth sores   Current Outpatient Prescriptions on File Prior to Visit  Medication Sig Dispense Refill  . aspirin 81 MG tablet Take 81 mg by mouth every morning.     . B Complex-C (SUPER B COMPLEX PO) Take 1 tablet by mouth every morning.     . Cholecalciferol 1000 UNITS capsule Take 1,000 Units by mouth every morning.     . clonazePAM (KLONOPIN) 0.25 MG disintegrating tablet Take 1 tablet (0.25 mg total) by mouth 2 (two) times daily as needed (insomnia or anxiety). Insomnia 60 tablet 2  . FLUoxetine (PROZAC) 10 MG tablet TAKE 2 TABLETS BY MOUTH DAILY 60 tablet 5  . indapamide (LOZOL) 1.25 MG tablet TAKE 1 TABLET BY MOUTH EVERY MORNING FOR SWELLING 30 tablet 5  . loratadine (CLARITIN) 10 MG tablet Take 1 tablet (10 mg total) by  mouth every morning. 100 tablet 3  . mometasone-formoterol (DULERA) 200-5 MCG/ACT AERO Inhale 2 puffs into the lungs 1 day or 1 dose. 1 Inhaler 11  . Omega-3 Fatty Acids (FISH OIL) 1000 MG CAPS Take 1 capsule by mouth every morning.     Marland Kitchen omeprazole (PRILOSEC) 20 MG capsule TAKE 2 CAPSULES BY MOUTH DAILY 60 capsule 5  . potassium chloride (KLOR-CON 10) 10 MEQ tablet Take 1 tablet (10 mEq total) by mouth daily. 30 tablet 11  . traMADol (ULTRAM) 50 MG tablet TAKE 1 TABLET BY MOUTH EVERY 8 HOURS AS NEEDED FOR PAIN 100 tablet 2   No current facility-administered medications on file prior to visit.   BP 130/84 mmHg  Pulse 72  Temp(Src) 98.2 F (36.8 C) (Oral)  Resp 16  Ht 5\' 4"  (1.626 m)  Wt 231 lb 8 oz (105.008 kg)  BMI 39.72 kg/m2  SpO2 94%   Review of Systems  Constitutional: Negative for fever, chills, activity change, appetite change, fatigue and unexpected weight change.  HENT: Negative for congestion, mouth  sores and sinus pressure.   Eyes: Negative for visual disturbance.  Respiratory: Negative for cough and chest tightness.   Gastrointestinal: Negative for nausea and abdominal pain.  Genitourinary: Negative for frequency, difficulty urinating and vaginal pain.  Musculoskeletal: Positive for gait problem. Negative for back pain and myalgias.  Skin: Negative for pallor and rash.  Neurological: Negative for dizziness, tremors, weakness, numbness and headaches.  Psychiatric/Behavioral: Negative for confusion and sleep disturbance. The patient is nervous/anxious.    Wt Readings from Last 3 Encounters:  06/12/14 231 lb 8 oz (105.008 kg)  04/05/14 239 lb 9.6 oz (108.682 kg)  03/06/14 235 lb (106.595 kg)   BP Readings from Last 3 Encounters:  06/12/14 130/84  04/05/14 132/80  03/06/14 128/72       Objective:   Physical Exam  Constitutional: She appears well-developed. No distress.  HENT:  Head: Normocephalic.  Right Ear: External ear normal.  Left Ear: External ear normal.  Nose: Nose normal.  Mouth/Throat: Oropharynx is clear and moist.  Eyes: Conjunctivae are normal. Pupils are equal, round, and reactive to light. Right eye exhibits no discharge. Left eye exhibits no discharge.  Neck: Normal range of motion. Neck supple. No JVD present. No tracheal deviation present. No thyromegaly present.  Cardiovascular: Normal rate, regular rhythm and normal heart sounds.   Pulmonary/Chest: No stridor. No respiratory distress. She has no wheezes.  Abdominal: Soft. Bowel sounds are normal. She exhibits no distension and no mass. There is no tenderness. There is no rebound and no guarding.  Musculoskeletal: She exhibits no edema and no tenderness.  Lymphadenopathy:    She has no cervical adenopathy.  Neurological: She displays abnormal reflex. No cranial nerve deficit. She exhibits normal muscle tone. Coordination abnormal.  Skin: No rash noted. No erythema.  Psychiatric: She has a normal mood and  affect. Her behavior is normal. Judgment and thought content normal.  R shoulder is tender LS is less tender  Lab Results  Component Value Date   WBC 4.9 09/06/2011   HGB 14.5 09/06/2011   HCT 45.2 09/06/2011   PLT 186 09/06/2011   GLUCOSE 89 06/25/2013   CHOL 226* 05/16/2012   TRIG 123.0 05/16/2012   HDL 50.30 05/16/2012   LDLDIRECT 159.1 05/16/2012   LDLCALC 107* 04/04/2009   ALT 17 08/15/2012   AST 17 08/15/2012   NA 137 06/25/2013   K 4.1 06/25/2013   CL 97 06/25/2013   CREATININE  1.0 06/25/2013   BUN 15 06/25/2013   CO2 32 06/25/2013   TSH 1.62 06/25/2013   INR 1.07 09/06/2011   HGBA1C 5.8 06/25/2013      MRI 1/13: IMPRESSION:  1. Severe multifactorial spinal and lateral recess stenosis at L4-  L5 plus moderate right foraminal stenosis.  2. Borderline to mild multifactorial spinal stenosis at T11-T12  and L3-L4.  3. Mild to moderate multifactorial left L5 foraminal stenosis.   Original Report Authenticated By: Randall An, M.D. Clinical Data: [place]. Hit head.    CT HEAD WITHOUT CONTRAST 12/11 CT CERVICAL SPINE WITHOUT CONTRAST  Technique: Multidetector CT imaging of the head and cervical spine  was performed following the standard protocol without intravenous  contrast. Multiplanar CT image reconstructions of the cervical  spine were also generated.  Comparison: None  CT HEAD  Findings: There is mild age related cerebral atrophy and  ventriculomegaly. No extra-axial fluid collections are seen. No  CT findings for hemispheric infarction or intracranial hemorrhage.  No mass lesions. The brainstem and cerebellum are unremarkable.  The bony calvarium is intact. Benign appearing calvarial lesion  noted in the right frontal area. The paranasal sinuses and mastoid  air cells are clear. The globes are intact.  IMPRESSION:  1. Age related cerebral atrophy and ventriculomegaly.  2. No acute intracranial findings or mass lesions.  3. No skull fracture.  CT  CERVICAL SPINE  Findings: Advanced degenerative cervical spondylosis with disc  disease and facet disease most notable at C4-5, C5-6 and C6-7. The  facets are normally aligned. No facet or laminar fractures. No  acute vertebral body fractures or abnormal prevertebral soft tissue  swelling. The C1-2 articulations are maintained. The dens appears  normal.  No large disc protrusions are seen. There is moderate osteophytic  spurring posteriorly at C5-6 and C6-7 with mild mass effect on the  thecal sac. Mild bilateral foraminal stenosis at C5-6 and C6-7.  IMPRESSION:  1. Degenerative cervical spondylosis with disc disease and facet  disease.  2. No acute bony findings and normal alignment.   Provider: Durene Fruits   Lab Results  Component Value Date   WBC 4.9 09/06/2011   HGB 14.5 09/06/2011   HCT 45.2 09/06/2011   PLT 186 09/06/2011   GLUCOSE 89 06/25/2013   CHOL 226* 05/16/2012   TRIG 123.0 05/16/2012   HDL 50.30 05/16/2012   LDLDIRECT 159.1 05/16/2012   LDLCALC 107* 04/04/2009   ALT 17 08/15/2012   AST 17 08/15/2012   NA 137 06/25/2013   K 4.1 06/25/2013   CL 97 06/25/2013   CREATININE 1.0 06/25/2013   BUN 15 06/25/2013   CO2 32 06/25/2013   TSH 1.62 06/25/2013   INR 1.07 09/06/2011   HGBA1C 5.8 06/25/2013             Assessment & Plan:

## 2014-06-12 NOTE — Assessment & Plan Note (Signed)
Continue with current prescription therapy as reflected on the Med list.  

## 2014-06-12 NOTE — Assessment & Plan Note (Signed)
R shoulder - worse Ortho ref

## 2014-07-04 ENCOUNTER — Other Ambulatory Visit: Payer: Self-pay | Admitting: Internal Medicine

## 2014-07-05 ENCOUNTER — Encounter: Payer: Self-pay | Admitting: Pulmonary Disease

## 2014-07-05 ENCOUNTER — Ambulatory Visit (INDEPENDENT_AMBULATORY_CARE_PROVIDER_SITE_OTHER): Payer: Medicare Other | Admitting: Pulmonary Disease

## 2014-07-05 VITALS — BP 124/76 | HR 72 | Temp 97.6°F | Ht 64.0 in | Wt 234.0 lb

## 2014-07-05 DIAGNOSIS — G4733 Obstructive sleep apnea (adult) (pediatric): Secondary | ICD-10-CM | POA: Diagnosis not present

## 2014-07-05 NOTE — Patient Instructions (Signed)
Will try [employer] mask Will send an order to your home care company to show you how to use heated humidifier and review maintenance schedule Work on weight loss followup with me again in 60mos

## 2014-07-05 NOTE — Progress Notes (Signed)
   Subjective:    Patient ID: Sabrina Page, female    DOB: 08-13-1933, 81 y.o.   MRN: [id number]  HPI The patient comes in today for follow-up of her obstructive sleep apnea. She is gradually getting more accustomed to her device, and her downloaded shows 4 hours of usage a night. She has excellent control of her AHI, and no significant mask leak. She tells me that she continues to have sleep disruption because of the pull of the hose on her mask.  She wishes to try something different that may be easier.   Review of Systems  Constitutional: Negative for fever and unexpected weight change.  HENT: Negative for congestion, dental problem, ear pain, nosebleeds, postnasal drip, rhinorrhea, sinus pressure, sneezing, sore throat and trouble swallowing.   Eyes: Negative for redness and itching.  Respiratory: Negative for cough, chest tightness, shortness of breath and wheezing.   Cardiovascular: Negative for palpitations and leg swelling.  Gastrointestinal: Negative for nausea and vomiting.  Genitourinary: Negative for dysuria.  Musculoskeletal: Negative for joint swelling.  Skin: Negative for rash.  Neurological: Negative for headaches.  Hematological: Does not bruise/bleed easily.  Psychiatric/Behavioral: Negative for dysphoric mood. The patient is not nervous/anxious.        Objective:   Physical Exam Morbidly obese female in no acute distress Nose without purulence or discharge noted Skin breakdown or pressure necrosis from the C Pap mask Neck without lymphadenopathy or thyromegaly Lower extremities with edema noted, no cyanosis Alert and oriented, moves all 4 extremities.       Assessment & Plan:

## 2014-07-05 NOTE — Assessment & Plan Note (Signed)
The patient is gradually coming accustomed to see Pap, and now is wearing about 4 hours a night. On her current setting she is having excellent control of her AHI, and no significant mask leak. She continues to struggle with mask fit, and would like to try a nasal pillows apparatus. She understands that she needs to keep her mouth closed during the night. I have also encouraged her to work aggressively on weight loss.

## 2014-07-23 DIAGNOSIS — S43401A Unspecified sprain of right shoulder joint, initial encounter: Secondary | ICD-10-CM | POA: Diagnosis not present

## 2014-07-23 DIAGNOSIS — S46911A Strain of unspecified muscle, fascia and tendon at shoulder and upper arm level, right arm, initial encounter: Secondary | ICD-10-CM | POA: Diagnosis not present

## 2014-07-31 ENCOUNTER — Ambulatory Visit: Payer: Medicare Other | Attending: Orthopedic Surgery | Admitting: Physical Therapy

## 2014-07-31 DIAGNOSIS — R293 Abnormal posture: Secondary | ICD-10-CM | POA: Diagnosis not present

## 2014-07-31 DIAGNOSIS — M25511 Pain in right shoulder: Secondary | ICD-10-CM | POA: Insufficient documentation

## 2014-07-31 DIAGNOSIS — R29898 Other symptoms and signs involving the musculoskeletal system: Secondary | ICD-10-CM

## 2014-07-31 NOTE — Therapy (Signed)
[place], [place], [address] Phone: [phone]   Fax:  [phone]  Physical Therapy Evaluation  Patient Details  Name: Sabrina Page MRN: [id number] Date of Birth: 06-22-1933 Referring Provider:  Latanya Maudlin, MD  Encounter Date: 07/31/2014      PT End of Session - 07/31/14 1235    Visit Number 1   Number of Visits 16   Date for PT Re-Evaluation 09/30/14   PT Start Time 6606   PT Stop Time 1233   PT Time Calculation (min) 48 min   Activity Tolerance Patient tolerated treatment well   Behavior During Therapy [facility] for tasks assessed/performed      Past Medical History  Diagnosis Date  . Allergy     rhinitis  . Asthma   . Hyperlipidemia   . Hypertension   . Depression   . GERD (gastroesophageal reflux disease)   . Osteoarthritis   . Obesity   . Urinary incontinence   . Shoulder injury     left  . COPD (chronic obstructive pulmonary disease)   . Osteoporosis   . Gallstones   . Peripheral vascular disease     swelling   . Shortness of breath     with exertion   . Pneumonia   . Sleep apnea     never diagnosed   . Chronic kidney disease     hx of cystitis   . Dizziness     vertigo     Past Surgical History  Procedure Laterality Date  . Right left knee arthroplast    . L tkr    . Other surgical history      left leg surgery   . Other surgical history      left breast surgery due to mastitis   . Liver biopsy  09/14/2011    Procedure: LIVER BIOPSY;  Surgeon: Odis Hollingshead, MD;  Location: [facility];  Service: General;  Laterality: N/A;  . Cholecystectomy  09/14/2011    Procedure: LAPAROSCOPIC CHOLECYSTECTOMY;  Surgeon: Odis Hollingshead, MD;  Location: [facility];  Service: General;  Laterality: N/A;  attempted intraoperative cholangiogram     There were no vitals taken for this visit.  Visit Diagnosis:  Right shoulder pain - Plan: PT plan of care cert/re-cert  Shoulder  weakness - Plan: PT plan of care cert/re-cert  Poor posture - Plan: PT plan of care cert/re-cert      Subjective Assessment - 07/31/14 1154    Symptoms pt is 81 y.o F with CC of R shoulder pain.  this happened when she was trying to hold a door about 3 months ago and the pain has gotten better since onset.    Limitations Lifting;House hold activities  pouring water and household chores   How long can you sit comfortably? >2-3 hours   How long can you stand comfortably? >2 hours   How long can you walk comfortably? >2 hours   Diagnostic tests x-ray but pt is unremarkable with muscle involvement   Patient Stated Goals To be pain free and to be able to do eveything and exercise.    Currently in Pain? Yes   Pain Score 3    Pain Location Shoulder   Pain Orientation Right;Posterior   Pain Descriptors / Indicators Sharp;Aching   Pain Type Chronic pain   Pain Onset More than a month ago   Pain Frequency Intermittent   Aggravating Factors  lifting and carrying, overhead activities, pulling items away  from the body.    Pain Relieving Factors lay down on the Left side   Effect of Pain on Daily Activities difficulty lifting and carrying associated with chores.           [facility] PT Assessment - 07/31/14 1202    Assessment   Medical Diagnosis r shoulder pain   Onset Date 04/28/15   Next MD Visit no return appointment   only go as need   Prior Therapy no   Precautions   Precautions None   Restrictions   Weight Bearing Restrictions No   Balance Screen   Has the patient fallen in the past 6 months No   Has the patient had a decrease in activity level because of a fear of falling?  No   Is the patient reluctant to leave their home because of a fear of falling?  No   Home Environment   Living Enviornment Private residence   Living Arrangements Spouse/significant other   Available Help at Discharge Available 24 hours/day   Type of [place]  level   Prior Function   Level of Independence Independent with basic ADLs;Independent with homemaking with ambulation;Independent with gait;Independent with transfers   Vocation Retired   Leisure walking around, watch tv, reading   Observation/Other Assessments   Focus on Therapeutic Outcomes (FOTO)  48% impaired  projected to improve 36%   Posture/Postural Control   Posture/Postural Control Postural limitations   Postural Limitations Forward head;Rounded Shoulders   ROM / Strength   AROM / PROM / Strength AROM;PROM;Strength   AROM   Overall AROM  Within functional limits for tasks performed   AROM Assessment Site Shoulder   Right/Left Shoulder Right;Left   PROM   Overall PROM  Within functional limits for tasks performed   PROM Assessment Site Shoulder   Right/Left Shoulder Right   Strength   Strength Assessment Site Shoulder   Right/Left Shoulder Right   Right Shoulder Flexion 4-/5   Right Shoulder Extension 4-/5   Right Shoulder ABduction 3+/5  pain during testing   Right Shoulder Internal Rotation 4-/5   Right Shoulder External Rotation 3+/5  pain during testing   Palpation   Palpation tenderness noted at the R rhomoboids, middle deltoid and superior head of the biceps   Special Tests   Rotator Cuff Impingment tests Full Can test;Empty Can test;Hawkins- Merrilyn Puma test;Painful Arc of Motion   Hawkins-Kennedy test   Findings Positive   Side Right   Empty Can test   Findings Positive   Side Right   Full Can test   Findings Positive   Side Right   Painful Arc of Motion   Findings Positive   Side Right                  [facility] Adult PT Treatment/Exercise - 07/31/14 0001    Shoulder Exercises: Standing   External Rotation AROM;Strengthening;Right;10 reps;Theraband   Theraband Level (Shoulder External Rotation) Level 2 (Red)   Internal Rotation AROM;Strengthening;Right;Theraband;15 reps   Theraband Level (Shoulder Internal Rotation) Level 2 (Red)   Other  Standing Exercises wall push ups  x 15 reps                PT Education - 07/31/14 1234    Education provided Yes   Education Details evaluation findings, POC, Prognosis, goals   Person(s) Educated Patient   Methods Explanation   Comprehension Verbalized understanding  PT Short Term Goals - 09-Aug-2014 1242    PT SHORT TERM GOAL #1   Title To be independet with basic HEP (08-09-2014)   Time 4   Period Weeks   Status New   PT SHORT TERM GOAL #2   Title pt will have <2/10 pain during and following RUE lifting and carrying to assit with functional progression (08/09/2014)   Time 8   Period Weeks   Status New   PT SHORT TERM GOAL #3   Title Pt will have > 4/5 strength with R shoulder flexors and external rotations to assist with house chores and ADLs (08/09/14)   Time 8   Period Weeks   Status New           PT Long Term Goals - Aug 09, 2014 1244    PT LONG TERM GOAL #1   Title To be I with advanced HEP (Aug 09, 2014)   Time 8   Period Weeks   Status New   PT LONG TERM GOAL #2   Title pt will decrease pain to <2/10 following overhead activities to assist with personal hygene (08-09-2014)   Time 8   Period Weeks   Status New   PT LONG TERM GOAL #3   Title Pt will have >4-/5 strength of the R shoulder muscles to assist with lifting and carrying activities associated with household chores (08-09-14)   Time 8   Period Weeks   Status New   PT LONG TERM GOAL #4   Title Pt will be able to verbalize and demonstrate techniques to reduce R shoulder reinjury via postural awareness, lifting and carrying mechanics and HEP    Time 8   Period Weeks   Status New               Plan - August 09, 2014 1235    Clinical Impression Statement Sabrina Page presents to OPPT with CC of r shoulder pain. bil shoulders have full AROM WNL but has  pain during resisted testing of the R shoulder flexors and external rotatiors with weakness due to pain.  she positive of hawkin kennedy, open can,  and  painful arc, and neer testing that is consistent with shoulder  impingment with tenderness at the supraspinatus and long head biceps tendon. pt demonstrates Forward head posture with ant rolled shouders and sits  in a slumped position. She would benefit from skilled physical therapy to maximize her functional capacity without pain by address the defecits listed.    Pt will benefit from skilled therapeutic intervention in order to improve on the following deficits Decreased coordination;Postural dysfunction;Decreased endurance;Increased muscle spasms;Decreased strength;Pain   Rehab Potential Good   PT Frequency 2x / week   PT Duration 8 weeks   PT Treatment/Interventions ADLs/Self Care Home Management;Moist Heat;Therapeutic activities;Patient/family education;Therapeutic exercise;Passive range of motion;Ultrasound;Manual techniques;Cryotherapy;Neuromuscular re-education;Electrical Stimulation   PT Next Visit Plan modalites for pain, scapular stability exercises, shoulder strengthening   PT Home Exercise Plan shoulder IR/ER with Red theraband, wall pushups   Consulted and Agree with Plan of Care Patient          G-Codes - August 09, 2014 1305    Functional Assessment Tool Used FOTO 48% impairment with projected improvement to 36%   Functional Limitation Carrying, moving and handling objects   Carrying, Moving and Handling Objects Current Status (B1478) At least 40 percent but less than 60 percent impaired, limited or restricted   Carrying, Moving and Handling Objects Goal Status (G9562) At least 20 percent but less than 40 percent impaired,  limited or restricted       Problem List Patient Active Problem List   Diagnosis Date Noted  . Morbid obesity 03/06/2014  . Right tennis elbow 01/02/2014  . Wart 01/02/2014  . Chronic fatigue 06/25/2013  . External hemorrhoid, bleeding 02/14/2013  . OSA (obstructive sleep apnea) 02/10/2012  . LBP radiating to both legs 05/14/2011  . Falls frequently  05/14/2011  . Fatigue 04/22/2011  . Hyperglycemia 04/22/2011  . Neoplasm of uncertain behavior of skin 01/11/2011  . Anxiety state 07/06/2010  . SHOULDER PAIN 05/21/2010  . CONCUSSION WITH LOSS OF CONSCIOUSNESS 05/21/2010  . Actinic keratosis 04/08/2010  . CARPAL TUNNEL SYNDROME 11/28/2009  . NECK PAIN 11/28/2009  . DIZZINESS 11/28/2009  . INSOMNIA, CHRONIC 06/23/2009  . TOBACCO USE, QUIT 04/11/2009  . DIVERTICULOSIS OF COLON 09/06/2008  . DIVERTICULITIS OF COLON 09/06/2008  . Unspecified chest pain 09/06/2008  . GERD 06/05/2008  . OSTEOARTHRITIS 06/05/2008  . ABNORMAL GLUCOSE NEC 06/05/2008  . BRONCHITIS, ACUTE 04/04/2008  . APHTHOUS STOMATITIS 03/25/2008  . VENOUS INSUFFICIENCY 11/03/2007  . Edema 11/03/2007  . SHINGLES 10/23/2007  . DENTAL PAIN 10/23/2007  . PARESTHESIA 10/23/2007  . Pain in Soft Tissues of Limb 07/06/2007  . HYPERLIPIDEMIA 02/17/2007  . Depression 02/17/2007  . Essential hypertension 02/17/2007  . ALLERGIC RHINITIS 02/17/2007  . Asthma 02/17/2007  . ASTHMA NOS W/ACUTE EXACERBATION 02/17/2007  . DYSPNEA/SHORTNESS OF BREATH 02/17/2007  . SYMPTOM, INCONTINENCE, URGE 02/17/2007   Starr Lake PT, DPT, LAT, ATC  07/31/2014  1:15 PM   [facility] [address] [place], [place], [address] Phone: [phone]   Fax:  [phone]

## 2014-07-31 NOTE — Patient Instructions (Signed)
Protraction: Push-Up (Wall)   Lean to wall, feet flat, arms bent, trunk straight, hands directly in front of shoulders, thumbs facing each other. Push to straight arms. Repeat 10 times per set. Do 3 sets per session. Do 5 sessions per week. Copyright  VHI. All rights reserved.   EXTERNAL ROTATION: Standing - Stable (Active)   Standing, right arm bent to 90, elbow against side, hand forward. Rotate forearm outward, keeping elbow against body. Rotate forearm outward as far as possible. Use ___ lbs. Complete 2 sets of 10 repetitions. Perform 1  sessions per day.  Copyright  VHI. All rights reserved.   INTERNAL ROTATION: Standing - Stable: Exercise Band (Active)   Stand, right arm bent to 90, elbow against side, forearm out from body. Against yellow resistance band, rotate arm in to body, keeping elbow at side. Complete 2 sets of 10 repetitions. Perform 1 sessions per day.  Copyright  VHI. All rights reserved.

## 2014-08-01 ENCOUNTER — Ambulatory Visit: Payer: Medicare Other | Admitting: Physical Therapy

## 2014-08-01 DIAGNOSIS — R293 Abnormal posture: Secondary | ICD-10-CM

## 2014-08-01 DIAGNOSIS — R29898 Other symptoms and signs involving the musculoskeletal system: Secondary | ICD-10-CM | POA: Diagnosis not present

## 2014-08-01 DIAGNOSIS — M25511 Pain in right shoulder: Secondary | ICD-10-CM | POA: Diagnosis not present

## 2014-08-01 NOTE — Therapy (Signed)
[place], [place], [address] Phone: [phone]   Fax:  [phone]  Physical Therapy Treatment  Patient Details  Name: Sabrina Page MRN: [id number] Date of Birth: 28-Jun-1933 Referring Provider:  Cassandria Anger, MD  Encounter Date: 08/01/2014      PT End of Session - 08/01/14 1457    Visit Number 2   Number of Visits 16   Date for PT Re-Evaluation 09/30/14   PT Start Time 4010   PT Stop Time 1500   PT Time Calculation (min) 45 min   Activity Tolerance Patient tolerated treatment well;Patient limited by pain      Past Medical History  Diagnosis Date  . Allergy     rhinitis  . Asthma   . Hyperlipidemia   . Hypertension   . Depression   . GERD (gastroesophageal reflux disease)   . Osteoarthritis   . Obesity   . Urinary incontinence   . Shoulder injury     left  . COPD (chronic obstructive pulmonary disease)   . Osteoporosis   . Gallstones   . Peripheral vascular disease     swelling   . Shortness of breath     with exertion   . Pneumonia   . Sleep apnea     never diagnosed   . Chronic kidney disease     hx of cystitis   . Dizziness     vertigo     Past Surgical History  Procedure Laterality Date  . Right left knee arthroplast    . L tkr    . Other surgical history      left leg surgery   . Other surgical history      left breast surgery due to mastitis   . Liver biopsy  09/14/2011    Procedure: LIVER BIOPSY;  Surgeon: Odis Hollingshead, MD;  Location: [facility];  Service: General;  Laterality: N/A;  . Cholecystectomy  09/14/2011    Procedure: LAPAROSCOPIC CHOLECYSTECTOMY;  Surgeon: Odis Hollingshead, MD;  Location: [facility];  Service: General;  Laterality: N/A;  attempted intraoperative cholangiogram     There were no vitals filed for this visit.  Visit Diagnosis:  Shoulder weakness  Poor posture      Subjective Assessment - 08/01/14 1425    Pain Location (p)  Shoulder   Pain Orientation (p) Right;Posterior            [facility] PT Assessment - 07/31/14 1202    Assessment   Medical Diagnosis r shoulder pain   Onset Date 04/28/15   Next MD Visit no return appointment   only go as need   Prior Therapy no   Precautions   Precautions None   Restrictions   Weight Bearing Restrictions No   Balance Screen   Has the patient fallen in the past 6 months No   Has the patient had a decrease in activity level because of a fear of falling?  No   Is the patient reluctant to leave their home because of a fear of falling?  No   Home Environment   Living Enviornment Private residence   Living Arrangements Spouse/significant other   Available Help at Discharge Available 24 hours/day   Type of [place] One level   Prior Function   Level of Independence Independent with basic ADLs;Independent with homemaking with ambulation;Independent with gait;Independent with transfers   [place] Retired   Leisure  walking around, watch tv, reading   Observation/Other Assessments   Focus on Therapeutic Outcomes (FOTO)  48% impaired  projected to improve 36%   Posture/Postural Control   Posture/Postural Control Postural limitations   Postural Limitations Forward head;Rounded Shoulders   ROM / Strength   AROM / PROM / Strength AROM;PROM;Strength   AROM   Overall AROM  Within functional limits for tasks performed   AROM Assessment Site Shoulder   Right/Left Shoulder Right;Left   PROM   Overall PROM  Within functional limits for tasks performed   PROM Assessment Site Shoulder   Right/Left Shoulder Right   Strength   Strength Assessment Site Shoulder   Right/Left Shoulder Right   Right Shoulder Flexion 4-/5   Right Shoulder Extension 4-/5   Right Shoulder ABduction 3+/5  pain during testing   Right Shoulder Internal Rotation 4-/5   Right Shoulder External Rotation 3+/5  pain during testing   Palpation   Palpation  tenderness noted at the R rhomoboids, middle deltoid and superior head of the biceps   Special Tests   Rotator Cuff Impingment tests Full Can test;Empty Can test;Hawkins- Merrilyn Puma test;Painful Arc of Motion   Hawkins-Kennedy test   Findings Positive   Side Right   Empty Can test   Findings Positive   Side Right   Full Can test   Findings Positive   Side Right   Painful Arc of Motion   Findings Positive   Side Right                   [facility] Adult PT Treatment/Exercise - 08/01/14 1424    Shoulder Exercises: Standing   Theraband Level (Shoulder External Rotation) Level 2 (Red)   External Rotation Weight (lbs) --  cues   Row Both;Theraband   Theraband Level (Shoulder Row) --  green, cues   Shoulder Exercises: Pulleys   Other Pulley Exercises 3 imnutes                PT Education - 08/01/14 1457    Methods Explanation;Demonstration;Handout   Comprehension Verbalized understanding          PT Short Term Goals - 07/31/14 1242    PT SHORT TERM GOAL #1   Title To be independet with basic HEP (07/31/2014)   Time 4   Period Weeks   Status New   PT SHORT TERM GOAL #2   Title pt will have <2/10 pain during and following RUE lifting and carrying to assit with functional progression (07/31/2014)   Time 8   Period Weeks   Status New   PT SHORT TERM GOAL #3   Title Pt will have > 4/5 strength with R shoulder flexors and external rotations to assist with house chores and ADLs (07/31/2014)   Time 8   Period Weeks   Status New           PT Long Term Goals - 07/31/14 1244    PT LONG TERM GOAL #1   Title To be I with advanced HEP (07/31/2014)   Time 8   Period Weeks   Status New   PT LONG TERM GOAL #2   Title pt will decrease pain to <2/10 following overhead activities to assist with personal hygene (07/31/2014)   Time 8   Period Weeks   Status New   PT LONG TERM GOAL #3   Title Pt will have >4-/5 strength of the R shoulder muscles to assist with lifting and  carrying activities associated with  household chores (08/10/14)   Time 8   Period Weeks   Status New   PT LONG TERM GOAL #4   Title Pt will be able to verbalize and demonstrate techniques to reduce R shoulder reinjury via postural awareness, lifting and carrying mechanics and HEP    Time 8   Period Weeks   Status New               Plan - 08/01/14 1416    Clinical Impression Statement Cortizone has helped it makes me shakey A little now, more with Abduction.           G-Codes - 2014-08-10 1305    Functional Assessment Tool Used FOTO 48% impairment with projected improvement to 36%   Functional Limitation Carrying, moving and handling objects   Carrying, Moving and Handling Objects Current Status [id number]) At least 40 percent but less than 60 percent impaired, limited or restricted   Carrying, Moving and Handling Objects Goal Status (U8891) At least 20 percent but less than 40 percent impaired, limited or restricted      Problem List Patient Active Problem List   Diagnosis Date Noted  . Morbid obesity 03/06/2014  . Right tennis elbow 01/02/2014  . Wart 01/02/2014  . Chronic fatigue 06/25/2013  . External hemorrhoid, bleeding 02/14/2013  . OSA (obstructive sleep apnea) 02/10/2012  . LBP radiating to both legs 05/14/2011  . Falls frequently 05/14/2011  . Fatigue 04/22/2011  . Hyperglycemia 04/22/2011  . Neoplasm of uncertain behavior of skin 01/11/2011  . Anxiety state 07/06/2010  . SHOULDER PAIN 05/21/2010  . CONCUSSION WITH LOSS OF CONSCIOUSNESS 05/21/2010  . Actinic keratosis 04/08/2010  . CARPAL TUNNEL SYNDROME 11/28/2009  . NECK PAIN 11/28/2009  . DIZZINESS 11/28/2009  . INSOMNIA, CHRONIC 06/23/2009  . TOBACCO USE, QUIT 04/11/2009  . DIVERTICULOSIS OF COLON 09/06/2008  . DIVERTICULITIS OF COLON 09/06/2008  . Unspecified chest pain 09/06/2008  . GERD 06/05/2008  . OSTEOARTHRITIS 06/05/2008  . ABNORMAL GLUCOSE NEC 06/05/2008  . BRONCHITIS, ACUTE 04/04/2008   . APHTHOUS STOMATITIS 03/25/2008  . VENOUS INSUFFICIENCY 11/03/2007  . Edema 11/03/2007  . SHINGLES 10/23/2007  . DENTAL PAIN 10/23/2007  . PARESTHESIA 10/23/2007  . Pain in Soft Tissues of Limb 07/06/2007  . HYPERLIPIDEMIA 02/17/2007  . Depression 02/17/2007  . Essential hypertension 02/17/2007  . ALLERGIC RHINITIS 02/17/2007  . Asthma 02/17/2007  . ASTHMA NOS W/ACUTE EXACERBATION 02/17/2007  . DYSPNEA/SHORTNESS OF BREATH 02/17/2007  . SYMPTOM, INCONTINENCE, URGE 02/17/2007  Melvenia Needles, PTA 08/01/2014 3:02 PM Phone: [phone] Fax: [phone]   Melvenia Needles 08/01/2014, 3:02 PM  [facility] [address] [place], [place], [address] Phone: [phone]   Fax:  [phone]

## 2014-08-07 ENCOUNTER — Ambulatory Visit: Payer: Medicare Other | Admitting: Physical Therapy

## 2014-08-07 DIAGNOSIS — R293 Abnormal posture: Secondary | ICD-10-CM | POA: Diagnosis not present

## 2014-08-07 DIAGNOSIS — R29898 Other symptoms and signs involving the musculoskeletal system: Secondary | ICD-10-CM

## 2014-08-07 DIAGNOSIS — M25511 Pain in right shoulder: Secondary | ICD-10-CM

## 2014-08-07 NOTE — Therapy (Signed)
[place], [place], [address] Phone: [phone]   Fax:  [phone]  Physical Therapy Treatment  Patient Details  Name: Sabrina Page MRN: [id number] Date of Birth: [birth year] Referring Provider:  Cassandria Anger, MD  Encounter Date: 08/07/2014      PT End of Session - 08/07/14 1238    Visit Number 3   Number of Visits 16   Date for PT Re-Evaluation 09/30/14   PT Start Time 1324   PT Stop Time 1230   PT Time Calculation (min) 45 min   Activity Tolerance Patient tolerated treatment well;Patient limited by pain   Behavior During Therapy [facility] for tasks assessed/performed      Past Medical History  Diagnosis Date  . Allergy     rhinitis  . Asthma   . Hyperlipidemia   . Hypertension   . Depression   . GERD (gastroesophageal reflux disease)   . Osteoarthritis   . Obesity   . Urinary incontinence   . Shoulder injury     left  . COPD (chronic obstructive pulmonary disease)   . Osteoporosis   . Gallstones   . Peripheral vascular disease     swelling   . Shortness of breath     with exertion   . Pneumonia   . Sleep apnea     never diagnosed   . Chronic kidney disease     hx of cystitis   . Dizziness     vertigo     Past Surgical History  Procedure Laterality Date  . Right left knee arthroplast    . L tkr    . Other surgical history      left leg surgery   . Other surgical history      left breast surgery due to mastitis   . Liver biopsy  09/14/2011    Procedure: LIVER BIOPSY;  Surgeon: Odis Hollingshead, MD;  Location: [facility];  Service: General;  Laterality: N/A;  . Cholecystectomy  09/14/2011    Procedure: LAPAROSCOPIC CHOLECYSTECTOMY;  Surgeon: Odis Hollingshead, MD;  Location: [facility];  Service: General;  Laterality: N/A;  attempted intraoperative cholangiogram     There were no vitals filed for this visit.  Visit Diagnosis:  Shoulder weakness  Poor posture  Right  shoulder pain      Subjective Assessment - 08/07/14 1150    Symptoms pt reports feeling good 2 days ago and that today she feels a little more sore in her shoulder.    Currently in Pain? Yes   Pain Score 4    Pain Location Shoulder   Pain Orientation Right;Posterior   Pain Descriptors / Indicators Aching;Sharp   Pain Type Chronic pain   Pain Onset More than a month ago   Pain Frequency Intermittent                       [facility] Adult PT Treatment/Exercise - 08/07/14 1153    Neck Exercises: Machines for Strengthening   UBE (Upper Arm Bike) Nu Step L6 x 6 min   Shoulder Exercises: Supine   Protraction AROM;Strengthening;Right  ceiling punch   Shoulder Exercises: Sidelying   External Rotation AROM;Strengthening;Right;15 reps;Weights   External Rotation Weight (lbs) 3#   Shoulder Exercises: Standing   Internal Rotation AROM;Strengthening;Right;Theraband;15 reps   Theraband Level (Shoulder Internal Rotation) Level 2 (Red)   Row AROM;Strengthening;Both;15 reps;Theraband   Theraband Level (Shoulder Row) Level 2 (Red)   Shoulder Exercises:  Pulleys   Other Pulley Exercises 2 min flexion, 2 min abd   Shoulder Exercises: ROM/Strengthening   Other ROM/Strengthening Exercises --   Shoulder Exercises: Stretch   Other Shoulder Stretches upper trap stretch   2 x 30 sec   Other Shoulder Stretches Pec stretch 2 x30   using 1/2 foam roll   Manual Therapy   Manual Therapy Massage   Massage trigger point release   R upper trap                PT Education - 08/07/14 1237    Education provided Yes   Education Details Stretches, and continue hep   Person(s) Educated Patient   Methods Explanation   Comprehension Verbalized understanding          PT Short Term Goals - 07/31/14 1242    PT SHORT TERM GOAL #1   Title To be independet with basic HEP (07/31/2014)   Time 4   Period Weeks   Status New   PT SHORT TERM GOAL #2   Title pt will have <2/10 pain during and  following RUE lifting and carrying to assit with functional progression (07/31/2014)   Time 8   Period Weeks   Status New   PT SHORT TERM GOAL #3   Title Pt will have > 4/5 strength with R shoulder flexors and external rotations to assist with house chores and ADLs (07/31/2014)   Time 8   Period Weeks   Status New           PT Long Term Goals - 07/31/14 1244    PT LONG TERM GOAL #1   Title To be I with advanced HEP (07/31/2014)   Time 8   Period Weeks   Status New   PT LONG TERM GOAL #2   Title pt will decrease pain to <2/10 following overhead activities to assist with personal hygene (07/31/2014)   Time 8   Period Weeks   Status New   PT LONG TERM GOAL #3   Title Pt will have >4-/5 strength of the R shoulder muscles to assist with lifting and carrying activities associated with household chores (07/31/2014)   Time 8   Period Weeks   Status New   PT LONG TERM GOAL #4   Title Pt will be able to verbalize and demonstrate techniques to reduce R shoulder reinjury via postural awareness, lifting and carrying mechanics and HEP    Time 8   Period Weeks   Status New               Plan - 08/07/14 1238    Clinical Impression Statement Kippy contiues to demonstrate pain in the R shoulder with R upper trap and pec tightness.  She reported decreased pain following stretches and exercises today.   PT Next Visit Plan modalites for pain, scapular stability exercises, shoulder strengthening   PT Home Exercise Plan upper trap and pec stretch        Problem List Patient Active Problem List   Diagnosis Date Noted  . Morbid obesity 03/06/2014  . Right tennis elbow 01/02/2014  . Wart 01/02/2014  . Chronic fatigue 06/25/2013  . External hemorrhoid, bleeding 02/14/2013  . OSA (obstructive sleep apnea) 02/10/2012  . LBP radiating to both legs 05/14/2011  . Falls frequently 05/14/2011  . Fatigue 04/22/2011  . Hyperglycemia 04/22/2011  . Neoplasm of uncertain behavior of skin  01/11/2011  . Anxiety state 07/06/2010  . SHOULDER PAIN 05/21/2010  . CONCUSSION WITH  LOSS OF CONSCIOUSNESS 05/21/2010  . Actinic keratosis 04/08/2010  . CARPAL TUNNEL SYNDROME 11/28/2009  . NECK PAIN 11/28/2009  . DIZZINESS 11/28/2009  . INSOMNIA, CHRONIC 06/23/2009  . TOBACCO USE, QUIT 04/11/2009  . DIVERTICULOSIS OF COLON 09/06/2008  . DIVERTICULITIS OF COLON 09/06/2008  . Unspecified chest pain 09/06/2008  . GERD 06/05/2008  . OSTEOARTHRITIS 06/05/2008  . ABNORMAL GLUCOSE NEC 06/05/2008  . BRONCHITIS, ACUTE 04/04/2008  . APHTHOUS STOMATITIS 03/25/2008  . VENOUS INSUFFICIENCY 11/03/2007  . Edema 11/03/2007  . SHINGLES 10/23/2007  . DENTAL PAIN 10/23/2007  . PARESTHESIA 10/23/2007  . Pain in Soft Tissues of Limb 07/06/2007  . HYPERLIPIDEMIA 02/17/2007  . Depression 02/17/2007  . Essential hypertension 02/17/2007  . ALLERGIC RHINITIS 02/17/2007  . Asthma 02/17/2007  . ASTHMA NOS W/ACUTE EXACERBATION 02/17/2007  . DYSPNEA/SHORTNESS OF BREATH 02/17/2007  . SYMPTOM, INCONTINENCE, URGE 02/17/2007   Starr Lake PT, DPT, LAT, ATC  08/07/2014  12:42 PM   [facility] [address] [place], [place], [address] Phone: [phone]   Fax:  [phone]

## 2014-08-08 ENCOUNTER — Ambulatory Visit: Payer: Medicare Other | Admitting: Physical Therapy

## 2014-08-08 DIAGNOSIS — R293 Abnormal posture: Secondary | ICD-10-CM | POA: Diagnosis not present

## 2014-08-08 DIAGNOSIS — M25511 Pain in right shoulder: Secondary | ICD-10-CM | POA: Diagnosis not present

## 2014-08-08 DIAGNOSIS — R29898 Other symptoms and signs involving the musculoskeletal system: Secondary | ICD-10-CM | POA: Diagnosis not present

## 2014-08-08 NOTE — Therapy (Signed)
[place], [place], [address] Phone: [phone]   Fax:  [phone]  Physical Therapy Treatment  Patient Details  Name: Sabrina Page MRN: [id number] Date of Birth: [birth year] Referring Provider:  Cassandria Anger, MD  Encounter Date: 08/08/2014      PT End of Session - 08/08/14 1420    Visit Number 4   Number of Visits 16   Date for PT Re-Evaluation 09/30/14   PT Start Time 0867   PT Stop Time 1500   PT Time Calculation (min) 45 min   Activity Tolerance Patient tolerated treatment well;Patient limited by pain   Behavior During Therapy [facility] for tasks assessed/performed      Past Medical History  Diagnosis Date  . Allergy     rhinitis  . Asthma   . Hyperlipidemia   . Hypertension   . Depression   . GERD (gastroesophageal reflux disease)   . Osteoarthritis   . Obesity   . Urinary incontinence   . Shoulder injury     left  . COPD (chronic obstructive pulmonary disease)   . Osteoporosis   . Gallstones   . Peripheral vascular disease     swelling   . Shortness of breath     with exertion   . Pneumonia   . Sleep apnea     never diagnosed   . Chronic kidney disease     hx of cystitis   . Dizziness     vertigo     Past Surgical History  Procedure Laterality Date  . Right left knee arthroplast    . L tkr    . Other surgical history      left leg surgery   . Other surgical history      left breast surgery due to mastitis   . Liver biopsy  09/14/2011    Procedure: LIVER BIOPSY;  Surgeon: Sabrina Hollingshead, MD;  Location: [facility];  Service: General;  Laterality: N/A;  . Cholecystectomy  09/14/2011    Procedure: LAPAROSCOPIC CHOLECYSTECTOMY;  Surgeon: Sabrina Hollingshead, MD;  Location: [facility];  Service: General;  Laterality: N/A;  attempted intraoperative cholangiogram     There were no vitals filed for this visit.  Visit Diagnosis:  Shoulder weakness  Poor posture  Right  shoulder pain      Subjective Assessment - 08/08/14 1418    Symptoms pt reports that she is feeling statisfied that she did the exercises but states that she has some muscle soreness in the R shoulder   Currently in Pain? Yes   Pain Score 5    Pain Location Shoulder   Pain Orientation Right   Pain Descriptors / Indicators Aching   Pain Type Chronic pain                       [facility] Adult PT Treatment/Exercise - 08/08/14 1420    Neck Exercises: Machines for Strengthening   UBE (Upper Arm Bike) UBE L1 for 6 min  alt direction every 2 min   Shoulder Exercises: Supine   Protraction AROM;Strengthening;Right  ceiling punches   Shoulder Exercises: Standing   External Rotation AROM;Strengthening;Right;15 reps   Theraband Level (Shoulder External Rotation) Level 2 (Red)   Internal Rotation AROM;Strengthening;Right;Theraband;15 reps   Theraband Level (Shoulder Internal Rotation) Level 2 (Red)   Shoulder Exercises: ROM/Strengthening   Other ROM/Strengthening Exercises finger ladder flexion x 3   with eccentric lowering   Other ROM/Strengthening Exercises  wand abduction x 6   with eccentric lower   Shoulder Exercises: Stretch   External Rotation Stretch 2 reps;30 seconds  performed in supine by PT   Other Shoulder Stretches upper trap stretch    Modalities   Modalities Ultrasound   Ultrasound   Ultrasound Location R upper trap/ supraspinatus   Ultrasound Parameters intensity 1.2 x 8 mins   Ultrasound Goals Pain   Manual Therapy   Manual Therapy Massage   Massage trigger point release   R upper trap                PT Education - 08/08/14 1722    Education provided Yes   Education Details scaption execise with yellow theraband   Person(s) Educated Patient   Methods Explanation   Comprehension Verbalized understanding          PT Short Term Goals - 07/31/14 1242    PT SHORT TERM GOAL #1   Title To be independet with basic HEP (07/31/2014)   Time 4    Period Weeks   Status New   PT SHORT TERM GOAL #2   Title pt will have <2/10 pain during and following RUE lifting and carrying to assit with functional progression (07/31/2014)   Time 8   Period Weeks   Status New   PT SHORT TERM GOAL #3   Title Pt will have > 4/5 strength with R shoulder flexors and external rotations to assist with house chores and ADLs (07/31/2014)   Time 8   Period Weeks   Status New           PT Long Term Goals - 07/31/14 1244    PT LONG TERM GOAL #1   Title To be I with advanced HEP (07/31/2014)   Time 8   Period Weeks   Status New   PT LONG TERM GOAL #2   Title pt will decrease pain to <2/10 following overhead activities to assist with personal hygene (07/31/2014)   Time 8   Period Weeks   Status New   PT LONG TERM GOAL #3   Title Pt will have >4-/5 strength of the R shoulder muscles to assist with lifting and carrying activities associated with household chores (07/31/2014)   Time 8   Period Weeks   Status New   PT LONG TERM GOAL #4   Title Pt will be able to verbalize and demonstrate techniques to reduce R shoulder reinjury via postural awareness, lifting and carrying mechanics and HEP    Time 8   Period Weeks   Status New               Plan - 08/08/14 1730    Clinical Impression Statement Sabrina Page has made some progress with decreased pain in the R shoulder.  She continues to demonstrate R shoulder pain with upper trap tightness and following todays treatment she was able to get 162 degrees of shoulder flexion, and 158 degress of abduction with mild pain at endrange.    PT Next Visit Plan modalites for pain, scapular stability exercises, shoulder strengthening   PT Home Exercise Plan scaption exercise with yellow theraband   Consulted and Agree with Plan of Care Patient        Problem List Patient Active Problem List   Diagnosis Date Noted  . Morbid obesity 03/06/2014  . Right tennis elbow 01/02/2014  . Wart 01/02/2014  . Chronic  fatigue 06/25/2013  . External hemorrhoid, bleeding 02/14/2013  . OSA (obstructive sleep  apnea) 02/10/2012  . LBP radiating to both legs 05/14/2011  . Falls frequently 05/14/2011  . Fatigue 04/22/2011  . Hyperglycemia 04/22/2011  . Neoplasm of uncertain behavior of skin 01/11/2011  . Anxiety state 07/06/2010  . SHOULDER PAIN 05/21/2010  . CONCUSSION WITH LOSS OF CONSCIOUSNESS 05/21/2010  . Actinic keratosis 04/08/2010  . CARPAL TUNNEL SYNDROME 11/28/2009  . NECK PAIN 11/28/2009  . DIZZINESS 11/28/2009  . INSOMNIA, CHRONIC 06/23/2009  . TOBACCO USE, QUIT 04/11/2009  . DIVERTICULOSIS OF COLON 09/06/2008  . DIVERTICULITIS OF COLON 09/06/2008  . Unspecified chest pain 09/06/2008  . GERD 06/05/2008  . OSTEOARTHRITIS 06/05/2008  . ABNORMAL GLUCOSE NEC 06/05/2008  . BRONCHITIS, ACUTE 04/04/2008  . APHTHOUS STOMATITIS 03/25/2008  . VENOUS INSUFFICIENCY 11/03/2007  . Edema 11/03/2007  . SHINGLES 10/23/2007  . DENTAL PAIN 10/23/2007  . PARESTHESIA 10/23/2007  . Pain in Soft Tissues of Limb 07/06/2007  . HYPERLIPIDEMIA 02/17/2007  . Depression 02/17/2007  . Essential hypertension 02/17/2007  . ALLERGIC RHINITIS 02/17/2007  . Asthma 02/17/2007  . ASTHMA NOS W/ACUTE EXACERBATION 02/17/2007  . DYSPNEA/SHORTNESS OF BREATH 02/17/2007  . SYMPTOM, INCONTINENCE, URGE 02/17/2007   Starr Lake PT, DPT, LAT, ATC  08/08/2014  5:35 PM   [facility] [address] [place], [place], [address] Phone: [phone]   Fax:  [phone]

## 2014-08-13 ENCOUNTER — Ambulatory Visit: Payer: Medicare Other | Admitting: Physical Therapy

## 2014-08-13 DIAGNOSIS — R293 Abnormal posture: Secondary | ICD-10-CM

## 2014-08-13 DIAGNOSIS — M25511 Pain in right shoulder: Secondary | ICD-10-CM | POA: Diagnosis not present

## 2014-08-13 DIAGNOSIS — R29898 Other symptoms and signs involving the musculoskeletal system: Secondary | ICD-10-CM

## 2014-08-13 NOTE — Therapy (Addendum)
[place], [place], [address] Phone: [phone]   Fax:  [phone]  Physical Therapy Treatment  Patient Details  Name: Sabrina Page MRN: [id number] Date of Birth: 03-Apr-1934 Referring Provider:  Cassandria Anger, MD  Encounter Date: 08/13/2014      PT End of Session - 08/13/14 1449    Visit Number 5   Number of Visits 16   Date for PT Re-Evaluation 09/30/14   PT Start Time 2637   PT Stop Time 1100   PT Time Calculation (min) 45 min   Activity Tolerance Patient tolerated treatment well   Behavior During Therapy [facility] for tasks assessed/performed      Past Medical History  Diagnosis Date  . Allergy     rhinitis  . Asthma   . Hyperlipidemia   . Hypertension   . Depression   . GERD (gastroesophageal reflux disease)   . Osteoarthritis   . Obesity   . Urinary incontinence   . Shoulder injury     left  . COPD (chronic obstructive pulmonary disease)   . Osteoporosis   . Gallstones   . Peripheral vascular disease     swelling   . Shortness of breath     with exertion   . Pneumonia   . Sleep apnea     never diagnosed   . Chronic kidney disease     hx of cystitis   . Dizziness     vertigo     Past Surgical History  Procedure Laterality Date  . Right left knee arthroplast    . L tkr    . Other surgical history      left leg surgery   . Other surgical history      left breast surgery due to mastitis   . Liver biopsy  09/14/2011    Procedure: LIVER BIOPSY;  Surgeon: Odis Hollingshead, MD;  Location: [facility];  Service: General;  Laterality: N/A;  . Cholecystectomy  09/14/2011    Procedure: LAPAROSCOPIC CHOLECYSTECTOMY;  Surgeon: Odis Hollingshead, MD;  Location: [facility];  Service: General;  Laterality: N/A;  attempted intraoperative cholangiogram     There were no vitals filed for this visit.  Visit Diagnosis:  Shoulder weakness  Poor posture  Right shoulder pain       Subjective Assessment - 08/13/14 1019    Symptoms She reports that she has had more pain in her R shoulder since the last visit, and reports that she is not feeling well today.    Currently in Pain? Yes   Pain Score 5    Pain Location Shoulder   Pain Orientation Right   Pain Descriptors / Indicators Aching   Pain Type Chronic pain   Pain Onset More than a month ago   Pain Frequency Intermittent                       [facility] Adult PT Treatment/Exercise - 08/13/14 1021    Neck Exercises: Machines for Strengthening   UBE (Upper Arm Bike) Nu Step L4 x 6 min   Shoulder Exercises: Supine   Protraction AROM;Strengthening;Right;15 reps;Theraband;Other (comment)  performed in standing   Theraband Level (Shoulder Protraction) Level 2 (Red)   Shoulder Exercises: Standing   External Rotation AROM;Strengthening;Right;15 reps   Theraband Level (Shoulder External Rotation) Level 2 (Red)   Internal Rotation AROM;Strengthening;Right;Theraband;15 reps   Theraband Level (Shoulder Internal Rotation) Level 2 (Red)   Shoulder Exercises: Pulleys  Flexion 2 minutes   ABduction 2 minutes   Shoulder Exercises: ROM/Strengthening   Other ROM/Strengthening Exercises finger ladder flexion x 4  with eccentric lowering    Other ROM/Strengthening Exercises wand abduction x 6    Modalities   Modalities Ultrasound   Ultrasound   Ultrasound Location R uppper trap    Ultrasound Parameters 1.2 intensity cont for 8 min.    Ultrasound Goals Pain   Manual Therapy   Manual Therapy Massage   Massage trigger point release   of R upper trap                PT Education - 08/13/14 1449    Education provided Yes   Education Details Stretches and posture   Person(s) Educated Patient   Methods Explanation   Comprehension Verbalized understanding          PT Short Term Goals - 07/31/14 1242    PT SHORT TERM GOAL #1   Title To be independet with basic HEP (07/31/2014)   Time 4   Period  Weeks   Status New   PT SHORT TERM GOAL #2   Title pt will have <2/10 pain during and following RUE lifting and carrying to assit with functional progression (07/31/2014)   Time 8   Period Weeks   Status New   PT SHORT TERM GOAL #3   Title Pt will have > 4/5 strength with R shoulder flexors and external rotations to assist with house chores and ADLs (07/31/2014)   Time 8   Period Weeks   Status New           PT Long Term Goals - 07/31/14 1244    PT LONG TERM GOAL #1   Title To be I with advanced HEP (07/31/2014)   Time 8   Period Weeks   Status New   PT LONG TERM GOAL #2   Title pt will decrease pain to <2/10 following overhead activities to assist with personal hygene (07/31/2014)   Time 8   Period Weeks   Status New   PT LONG TERM GOAL #3   Title Pt will have >4-/5 strength of the R shoulder muscles to assist with lifting and carrying activities associated with household chores (07/31/2014)   Time 8   Period Weeks   Status New   PT LONG TERM GOAL #4   Title Pt will be able to verbalize and demonstrate techniques to reduce R shoulder reinjury via postural awareness, lifting and carrying mechanics and HEP    Time 8   Period Weeks   Status New               Plan - 08/13/14 1449    Clinical Impression Statement Sabrina Page continues to make progress with R shoulder AROM WFL with limited pain at end ranges.  She continues to demonstrate R upper trap tightness and reports relief following [name] and STM.    PT Next Visit Plan modalites for pain, scapular stability exercises, shoulder strengthening   PT Home Exercise Plan same as previous   Consulted and Agree with Plan of Care Patient        Problem List Patient Active Problem List   Diagnosis Date Noted  . Morbid obesity 03/06/2014  . Right tennis elbow 01/02/2014  . Wart 01/02/2014  . Chronic fatigue 06/25/2013  . External hemorrhoid, bleeding 02/14/2013  . OSA (obstructive sleep apnea) 02/10/2012  . LBP radiating to  both legs 05/14/2011  . Falls frequently 05/14/2011  .  Fatigue 04/22/2011  . Hyperglycemia 04/22/2011  . Neoplasm of uncertain behavior of skin 01/11/2011  . Anxiety state 07/06/2010  . SHOULDER PAIN 05/21/2010  . CONCUSSION WITH LOSS OF CONSCIOUSNESS 05/21/2010  . Actinic keratosis 04/08/2010  . CARPAL TUNNEL SYNDROME 11/28/2009  . NECK PAIN 11/28/2009  . DIZZINESS 11/28/2009  . INSOMNIA, CHRONIC 06/23/2009  . TOBACCO USE, QUIT 04/11/2009  . DIVERTICULOSIS OF COLON 09/06/2008  . DIVERTICULITIS OF COLON 09/06/2008  . Unspecified chest pain 09/06/2008  . GERD 06/05/2008  . OSTEOARTHRITIS 06/05/2008  . ABNORMAL GLUCOSE NEC 06/05/2008  . BRONCHITIS, ACUTE 04/04/2008  . APHTHOUS STOMATITIS 03/25/2008  . VENOUS INSUFFICIENCY 11/03/2007  . Edema 11/03/2007  . SHINGLES 10/23/2007  . DENTAL PAIN 10/23/2007  . PARESTHESIA 10/23/2007  . Pain in Soft Tissues of Limb 07/06/2007  . HYPERLIPIDEMIA 02/17/2007  . Depression 02/17/2007  . Essential hypertension 02/17/2007  . ALLERGIC RHINITIS 02/17/2007  . Asthma 02/17/2007  . ASTHMA NOS W/ACUTE EXACERBATION 02/17/2007  . DYSPNEA/SHORTNESS OF BREATH 02/17/2007  . SYMPTOM, INCONTINENCE, URGE 02/17/2007   Starr Lake PT, DPT, LAT, ATC  08/13/2014  5:37 PM  [facility] [address] [place], [place], [address] Phone: [phone]   Fax:  [phone]

## 2014-08-14 DIAGNOSIS — F4321 Adjustment disorder with depressed mood: Secondary | ICD-10-CM | POA: Diagnosis not present

## 2014-08-15 ENCOUNTER — Ambulatory Visit: Payer: Medicare Other | Admitting: Physical Therapy

## 2014-08-15 DIAGNOSIS — M25511 Pain in right shoulder: Secondary | ICD-10-CM | POA: Diagnosis not present

## 2014-08-15 DIAGNOSIS — R293 Abnormal posture: Secondary | ICD-10-CM

## 2014-08-15 DIAGNOSIS — R29898 Other symptoms and signs involving the musculoskeletal system: Secondary | ICD-10-CM

## 2014-08-15 NOTE — Therapy (Signed)
[place], [place], [address] Phone: [phone]   Fax:  [phone]  Physical Therapy Treatment  Patient Details  Name: Sabrina Page MRN: [id number] Date of Birth: [birth year] Referring Provider:  Cassandria Anger, MD  Encounter Date: 08/15/2014      PT End of Session - 08/15/14 1054    Visit Number 6   Number of Visits 16   Date for PT Re-Evaluation 09/30/14   PT Start Time 0277   PT Stop Time 1055   PT Time Calculation (min) 40 min   Activity Tolerance Patient tolerated treatment well      Past Medical History  Diagnosis Date  . Allergy     rhinitis  . Asthma   . Hyperlipidemia   . Hypertension   . Depression   . GERD (gastroesophageal reflux disease)   . Osteoarthritis   . Obesity   . Urinary incontinence   . Shoulder injury     left  . COPD (chronic obstructive pulmonary disease)   . Osteoporosis   . Gallstones   . Peripheral vascular disease     swelling   . Shortness of breath     with exertion   . Pneumonia   . Sleep apnea     never diagnosed   . Chronic kidney disease     hx of cystitis   . Dizziness     vertigo     Past Surgical History  Procedure Laterality Date  . Right left knee arthroplast    . L tkr    . Other surgical history      left leg surgery   . Other surgical history      left breast surgery due to mastitis   . Liver biopsy  09/14/2011    Procedure: LIVER BIOPSY;  Surgeon: Odis Hollingshead, MD;  Location: [facility];  Service: General;  Laterality: N/A;  . Cholecystectomy  09/14/2011    Procedure: LAPAROSCOPIC CHOLECYSTECTOMY;  Surgeon: Odis Hollingshead, MD;  Location: [facility];  Service: General;  Laterality: N/A;  attempted intraoperative cholangiogram     There were no vitals filed for this visit.  Visit Diagnosis:  Shoulder weakness  Poor posture      Subjective Assessment - 08/15/14 1020    Symptoms Reaching up easier less pain,   reaching back to get seat belt painful 7/10.  Pt is helping.  Doing her exercises at home a little  Due to tooth problem,     Currently in Pain? No/denies   Pain Location Shoulder   Pain Orientation Right   Pain Descriptors / Indicators --  bad   Aggravating Factors  reaching behind   Pain Relieving Factors PT   Effect of Pain on Daily Activities hard to get seat belt.   Multiple Pain Sites No                       [facility] Adult PT Treatment/Exercise - 08/15/14 1024    Shoulder Exercises: Supine   Theraband Level (Shoulder Horizontal ABduction) Level 1 (Yellow)   External Rotation Weight (lbs) 10  yellow band   Flexion --  wide, narrow yell ow both   Shoulder Exercises: Prone   Retraction --  2 LBS 10 reps  Rt, 2 sets   Theraband Level (Shoulder Extension) --  2 LBS 10 reps.   Shoulder Exercises: Standing   Internal Rotation Limitations 20  cane   Extension 20 reps  cane ,   Shoulder Exercises: Pulleys   Flexion 2 minutes   Flexion Limitations 130   ABduction 2 minutes   Manual Therapy   Manual Therapy --  self mobilization for IR using towel roll and arm pulls post                PT Education - 08/15/14 1054    Person(s) Educated Patient   Methods Explanation;Demonstration;Handout   Comprehension Verbalized understanding;Returned demonstration          PT Short Term Goals - 07/31/14 1242    PT SHORT TERM GOAL #1   Title To be independet with basic HEP (07/31/2014)   Time 4   Period Weeks   Status New   PT SHORT TERM GOAL #2   Title pt will have <2/10 pain during and following RUE lifting and carrying to assit with functional progression (07/31/2014)   Time 8   Period Weeks   Status New   PT SHORT TERM GOAL #3   Title Pt will have > 4/5 strength with R shoulder flexors and external rotations to assist with house chores and ADLs (07/31/2014)   Time 8   Period Weeks   Status New           PT Long Term Goals - 07/31/14 1244    PT LONG  TERM GOAL #1   Title To be I with advanced HEP (07/31/2014)   Time 8   Period Weeks   Status New   PT LONG TERM GOAL #2   Title pt will decrease pain to <2/10 following overhead activities to assist with personal hygene (07/31/2014)   Time 8   Period Weeks   Status New   PT LONG TERM GOAL #3   Title Pt will have >4-/5 strength of the R shoulder muscles to assist with lifting and carrying activities associated with household chores (07/31/2014)   Time 8   Period Weeks   Status New   PT LONG TERM GOAL #4   Title Pt will be able to verbalize and demonstrate techniques to reduce R shoulder reinjury via postural awareness, lifting and carrying mechanics and HEP    Time 8   Period Weeks   Status New               Plan - 08/15/14 1055    Clinical Impression Statement Flexion no problem now,  less pain with IR post session, declined need for cold.  able to work toward exercise goals today        Problem List Patient Active Problem List   Diagnosis Date Noted  . Morbid obesity 03/06/2014  . Right tennis elbow 01/02/2014  . Wart 01/02/2014  . Chronic fatigue 06/25/2013  . External hemorrhoid, bleeding 02/14/2013  . OSA (obstructive sleep apnea) 02/10/2012  . LBP radiating to both legs 05/14/2011  . Falls frequently 05/14/2011  . Fatigue 04/22/2011  . Hyperglycemia 04/22/2011  . Neoplasm of uncertain behavior of skin 01/11/2011  . Anxiety state 07/06/2010  . SHOULDER PAIN 05/21/2010  . CONCUSSION WITH LOSS OF CONSCIOUSNESS 05/21/2010  . Actinic keratosis 04/08/2010  . CARPAL TUNNEL SYNDROME 11/28/2009  . NECK PAIN 11/28/2009  . DIZZINESS 11/28/2009  . INSOMNIA, CHRONIC 06/23/2009  . TOBACCO USE, QUIT 04/11/2009  . DIVERTICULOSIS OF COLON 09/06/2008  . DIVERTICULITIS OF COLON 09/06/2008  . Unspecified chest pain 09/06/2008  . GERD 06/05/2008  . OSTEOARTHRITIS 06/05/2008  . ABNORMAL GLUCOSE NEC 06/05/2008  . BRONCHITIS, ACUTE 04/04/2008  . APHTHOUS  STOMATITIS  03/25/2008  . VENOUS INSUFFICIENCY 11/03/2007  . Edema 11/03/2007  . SHINGLES 10/23/2007  . DENTAL PAIN 10/23/2007  . PARESTHESIA 10/23/2007  . Pain in Soft Tissues of Limb 07/06/2007  . HYPERLIPIDEMIA 02/17/2007  . Depression 02/17/2007  . Essential hypertension 02/17/2007  . ALLERGIC RHINITIS 02/17/2007  . Asthma 02/17/2007  . ASTHMA NOS W/ACUTE EXACERBATION 02/17/2007  . DYSPNEA/SHORTNESS OF BREATH 02/17/2007  . SYMPTOM, INCONTINENCE, URGE 02/17/2007    [facility] 08/15/2014, 10:58 AM  [facility] [address] [place], [place], [address] Phone: [phone]   Fax:  [phone]

## 2014-08-19 ENCOUNTER — Ambulatory Visit: Payer: Medicare Other | Admitting: Physical Therapy

## 2014-08-19 DIAGNOSIS — R29898 Other symptoms and signs involving the musculoskeletal system: Secondary | ICD-10-CM | POA: Diagnosis not present

## 2014-08-19 DIAGNOSIS — R293 Abnormal posture: Secondary | ICD-10-CM | POA: Diagnosis not present

## 2014-08-19 DIAGNOSIS — M25511 Pain in right shoulder: Secondary | ICD-10-CM | POA: Diagnosis not present

## 2014-08-19 NOTE — Patient Instructions (Signed)
Remove tape if irritating 

## 2014-08-19 NOTE — Therapy (Signed)
[place], [place], [address] Phone: [phone]   Fax:  [phone]  Physical Therapy Treatment  Patient Details  Name: Sabrina Page MRN: [id number] Date of Birth: 12-07-1933 Referring Provider:  Cassandria Anger, MD  Encounter Date: 08/19/2014      PT End of Session - 08/19/14 1309    Visit Number 7   Number of Visits 16   Date for PT Re-Evaluation 09/30/14   PT Start Time 1100   PT Stop Time 1146   PT Time Calculation (min) 46 min   Activity Tolerance Patient tolerated treatment well      Past Medical History  Diagnosis Date  . Allergy     rhinitis  . Asthma   . Hyperlipidemia   . Hypertension   . Depression   . GERD (gastroesophageal reflux disease)   . Osteoarthritis   . Obesity   . Urinary incontinence   . Shoulder injury     left  . COPD (chronic obstructive pulmonary disease)   . Osteoporosis   . Gallstones   . Peripheral vascular disease     swelling   . Shortness of breath     with exertion   . Pneumonia   . Sleep apnea     never diagnosed   . Chronic kidney disease     hx of cystitis   . Dizziness     vertigo     Past Surgical History  Procedure Laterality Date  . Right left knee arthroplast    . L tkr    . Other surgical history      left leg surgery   . Other surgical history      left breast surgery due to mastitis   . Liver biopsy  09/14/2011    Procedure: LIVER BIOPSY;  Surgeon: Odis Hollingshead, MD;  Location: [facility];  Service: General;  Laterality: N/A;  . Cholecystectomy  09/14/2011    Procedure: LAPAROSCOPIC CHOLECYSTECTOMY;  Surgeon: Odis Hollingshead, MD;  Location: [facility];  Service: General;  Laterality: N/A;  attempted intraoperative cholangiogram     There were no vitals filed for this visit.  Visit Diagnosis:  Shoulder weakness  Right shoulder pain      Subjective Assessment - 08/19/14 1103    Symptoms Has had tooth pain.  Feels  a little Rt shoulder pain, but does not feel good overall due to weather, age .   Currently in Pain? Yes   Pain Score 3    Pain Location Shoulder   Pain Orientation Right   Pain Descriptors / Indicators --  pain   Pain Type Chronic pain   Pain Relieving Factors PT                       [facility] Adult PT Treatment/Exercise - 08/19/14 1109    Neck Exercises: Machines for Strengthening   UBE (Upper Arm Bike) Nustep   L5 6 minutes   Shoulder Exercises: Standing   Flexion --  wall slides and lift off wall.  good motion, som pain   Shoulder Exercises: Stretch   Corner Stretch --  at door 1 arm, multiple cues needed, 4 reps 10 to 30 seconds   External Rotation Stretch 3 reps;30 seconds  painful, multiple cues.   Wall Stretch - Flexion 5 reps   Ultrasound   Ultrasound Location --  Shoulder, upper trap 1.2 watts/cm2. 100%   Manual Therapy   Manual Therapy --  kinesiotex inhibit deltoid, supra spinatus, posture correct                  PT Short Term Goals - 08/19/14 1311    PT SHORT TERM GOAL #1   Title To be independet with basic HEP (07/31/2014)   Time 4   Period Weeks   Status On-going   PT SHORT TERM GOAL #2   Title pt will have <2/10 pain during and following RUE lifting and carrying to assit with functional progression (07/31/2014)   Baseline 6/10   Time 8   Period Weeks   Status On-going   PT SHORT TERM GOAL #3   Time 8   Status Unable to assess           PT Long Term Goals - 07/31/14 1244    PT LONG TERM GOAL #1   Title To be I with advanced HEP (07/31/2014)   Time 8   Period Weeks   Status New   PT LONG TERM GOAL #2   Title pt will decrease pain to <2/10 following overhead activities to assist with personal hygene (07/31/2014)   Time 8   Period Weeks   Status New   PT LONG TERM GOAL #3   Title Pt will have >4-/5 strength of the R shoulder muscles to assist with lifting and carrying activities associated with household chores (07/31/2014)    Time 8   Period Weeks   Status New   PT LONG TERM GOAL #4   Title Pt will be able to verbalize and demonstrate techniques to reduce R shoulder reinjury via postural awareness, lifting and carrying mechanics and HEP    Time 8   Period Weeks   Status New               Plan - 08/19/14 1309    Clinical Impression Statement pain today.  Range flexion/abduction, painful with motion 5 to 6/10 with movement. 0 at rest   PT Next Visit Plan modalites for pain, scapular stability exercises, shoulder strengthening   PT Home Exercise Plan try 2 X a day if able        Problem List Patient Active Problem List   Diagnosis Date Noted  . Morbid obesity 03/06/2014  . Right tennis elbow 01/02/2014  . Wart 01/02/2014  . Chronic fatigue 06/25/2013  . External hemorrhoid, bleeding 02/14/2013  . OSA (obstructive sleep apnea) 02/10/2012  . LBP radiating to both legs 05/14/2011  . Falls frequently 05/14/2011  . Fatigue 04/22/2011  . Hyperglycemia 04/22/2011  . Neoplasm of uncertain behavior of skin 01/11/2011  . Anxiety state 07/06/2010  . SHOULDER PAIN 05/21/2010  . CONCUSSION WITH LOSS OF CONSCIOUSNESS 05/21/2010  . Actinic keratosis 04/08/2010  . CARPAL TUNNEL SYNDROME 11/28/2009  . NECK PAIN 11/28/2009  . DIZZINESS 11/28/2009  . INSOMNIA, CHRONIC 06/23/2009  . TOBACCO USE, QUIT 04/11/2009  . DIVERTICULOSIS OF COLON 09/06/2008  . DIVERTICULITIS OF COLON 09/06/2008  . Unspecified chest pain 09/06/2008  . GERD 06/05/2008  . OSTEOARTHRITIS 06/05/2008  . ABNORMAL GLUCOSE NEC 06/05/2008  . BRONCHITIS, ACUTE 04/04/2008  . APHTHOUS STOMATITIS 03/25/2008  . VENOUS INSUFFICIENCY 11/03/2007  . Edema 11/03/2007  . SHINGLES 10/23/2007  . DENTAL PAIN 10/23/2007  . PARESTHESIA 10/23/2007  . Pain in Soft Tissues of Limb 07/06/2007  . HYPERLIPIDEMIA 02/17/2007  . Depression 02/17/2007  . Essential hypertension 02/17/2007  . ALLERGIC RHINITIS 02/17/2007  . Asthma 02/17/2007  . ASTHMA NOS  W/ACUTE EXACERBATION 02/17/2007  . DYSPNEA/SHORTNESS  OF BREATH 02/17/2007  . SYMPTOM, INCONTINENCE, URGE 02/17/2007  Melvenia Needles, PTA 08/19/2014 1:14 PM Phone: [phone] Fax: [phone]  [facility] 08/19/2014, 1:14 PM  [facility] [address] [place], [place], [address] Phone: [phone]   Fax:  [phone]

## 2014-08-23 ENCOUNTER — Ambulatory Visit: Payer: Medicare Other | Attending: Orthopedic Surgery | Admitting: Physical Therapy

## 2014-08-23 DIAGNOSIS — R293 Abnormal posture: Secondary | ICD-10-CM | POA: Diagnosis not present

## 2014-08-23 DIAGNOSIS — R29898 Other symptoms and signs involving the musculoskeletal system: Secondary | ICD-10-CM | POA: Diagnosis not present

## 2014-08-23 DIAGNOSIS — M25511 Pain in right shoulder: Secondary | ICD-10-CM | POA: Diagnosis not present

## 2014-08-23 NOTE — Therapy (Signed)
[place], [place], [address] Phone: [phone]   Fax:  [phone]  Physical Therapy Treatment  Patient Details  Name: Sabrina Page MRN: [id number] Date of Birth: October 28, 1933 Referring Provider:  Cassandria Anger, MD  Encounter Date: 08/23/2014      PT End of Session - 08/23/14 0949    Visit Number 8   Number of Visits 16   Date for PT Re-Evaluation 09/30/14   PT Start Time 0845   PT Stop Time 0932   PT Time Calculation (min) 47 min      Past Medical History  Diagnosis Date  . Allergy     rhinitis  . Asthma   . Hyperlipidemia   . Hypertension   . Depression   . GERD (gastroesophageal reflux disease)   . Osteoarthritis   . Obesity   . Urinary incontinence   . Shoulder injury     left  . COPD (chronic obstructive pulmonary disease)   . Osteoporosis   . Gallstones   . Peripheral vascular disease     swelling   . Shortness of breath     with exertion   . Pneumonia   . Sleep apnea     never diagnosed   . Chronic kidney disease     hx of cystitis   . Dizziness     vertigo     Past Surgical History  Procedure Laterality Date  . Right left knee arthroplast    . L tkr    . Other surgical history      left leg surgery   . Other surgical history      left breast surgery due to mastitis   . Liver biopsy  09/14/2011    Procedure: LIVER BIOPSY;  Surgeon: Odis Hollingshead, MD;  Location: [facility];  Service: General;  Laterality: N/A;  . Cholecystectomy  09/14/2011    Procedure: LAPAROSCOPIC CHOLECYSTECTOMY;  Surgeon: Odis Hollingshead, MD;  Location: [facility];  Service: General;  Laterality: N/A;  attempted intraoperative cholangiogram     There were no vitals filed for this visit.  Visit Diagnosis:  Shoulder weakness  Right shoulder pain  Poor posture      Subjective Assessment - 08/23/14 0851    Symptoms "I just took the tape off yesterday it helped some but I still  have some pain in the shoulder". she reported only doing her HEP once, instead of the recommended 2 x a day from last visit.   Currently in Pain? Yes   Pain Score 3    Pain Location Shoulder   Pain Orientation Right   Pain Descriptors / Indicators Aching   Pain Type Chronic pain   Pain Frequency Intermittent                       [facility] Adult PT Treatment/Exercise - 08/23/14 0853    Shoulder Exercises: Standing   Other Standing Exercises scaption 2 x 10  with red theraband   Shoulder Exercises: ROM/Strengthening   UBE (Upper Arm Bike) L2.5 x 6 min   alt dir every 2 min   Ultrasound   Ultrasound Location R upper trapezius/supraspinatus   Ultrasound Parameters 1.2 intensity, Cont  x 8 minutes   Ultrasound Goals Pain   Manual Therapy   Manual Therapy Massage;Joint mobilization;Other (comment)   Joint Mobilization Scapular oscillations grade 2-3 in all directions  in Left sidelying   Massage trigger point release  R upper trapezius/levator scapulae,    Other Manual Therapy R median nerve glide  to help calm down radiating symptoms in R hand                PT Education - 08/23/14 0949    Education provided Yes   Education Details Postural control, and stretching of wrist ext/flexors to help with radiating forearm pain   Person(s) Educated Patient   Methods Explanation   Comprehension Verbalized understanding          PT Short Term Goals - 08/19/14 1311    PT SHORT TERM GOAL #1   Title To be independet with basic HEP (07/31/2014)   Time 4   Period Weeks   Status On-going   PT SHORT TERM GOAL #2   Title pt will have <2/10 pain during and following RUE lifting and carrying to assit with functional progression (07/31/2014)   Baseline 6/10   Time 8   Period Weeks   Status On-going   PT SHORT TERM GOAL #3   Time 8   Status Unable to assess           PT Long Term Goals - 07/31/14 1244    PT LONG TERM GOAL #1   Title To be I with advanced HEP  (07/31/2014)   Time 8   Period Weeks   Status New   PT LONG TERM GOAL #2   Title pt will decrease pain to <2/10 following overhead activities to assist with personal hygene (07/31/2014)   Time 8   Period Weeks   Status New   PT LONG TERM GOAL #3   Title Pt will have >4-/5 strength of the R shoulder muscles to assist with lifting and carrying activities associated with household chores (07/31/2014)   Time 8   Period Weeks   Status New   PT LONG TERM GOAL #4   Title Pt will be able to verbalize and demonstrate techniques to reduce R shoulder reinjury via postural awareness, lifting and carrying mechanics and HEP    Time 8   Period Weeks   Status New               Plan - 08/23/14 0950    Clinical Impression Statement Sabrina Page continues to make progress with full shoulder abd/flex with intermittent pain during movement rated at a 3/10.  Following Trigger point release, and scapular mobilizations she reported relief of symptoms. did Median nerve gliding to assess referring pain to the forearm but pt reported no increased in symptoms, She did report increase in forearm symptomolgy during and following elbow extensor stretch. elbow/forearm pain may be more from overuse and not so much referral of symptoms from the shoulder.    PT Next Visit Plan modalites for pain, scapular stability exercises and rotator cuff strengthening, scapular mobilizations, reasses Short term goals.    PT Home Exercise Plan same as previous   Consulted and Agree with Plan of Care Patient        Problem List Patient Active Problem List   Diagnosis Date Noted  . Morbid obesity 03/06/2014  . Right tennis elbow 01/02/2014  . Wart 01/02/2014  . Chronic fatigue 06/25/2013  . External hemorrhoid, bleeding 02/14/2013  . OSA (obstructive sleep apnea) 02/10/2012  . LBP radiating to both legs 05/14/2011  . Falls frequently 05/14/2011  . Fatigue 04/22/2011  . Hyperglycemia 04/22/2011  . Neoplasm of uncertain  behavior of skin 01/11/2011  . Anxiety state 07/06/2010  . SHOULDER PAIN  05/21/2010  . CONCUSSION WITH LOSS OF CONSCIOUSNESS 05/21/2010  . Actinic keratosis 04/08/2010  . CARPAL TUNNEL SYNDROME 11/28/2009  . NECK PAIN 11/28/2009  . DIZZINESS 11/28/2009  . INSOMNIA, CHRONIC 06/23/2009  . TOBACCO USE, QUIT 04/11/2009  . DIVERTICULOSIS OF COLON 09/06/2008  . DIVERTICULITIS OF COLON 09/06/2008  . Unspecified chest pain 09/06/2008  . GERD 06/05/2008  . OSTEOARTHRITIS 06/05/2008  . ABNORMAL GLUCOSE NEC 06/05/2008  . BRONCHITIS, ACUTE 04/04/2008  . APHTHOUS STOMATITIS 03/25/2008  . VENOUS INSUFFICIENCY 11/03/2007  . Edema 11/03/2007  . SHINGLES 10/23/2007  . DENTAL PAIN 10/23/2007  . PARESTHESIA 10/23/2007  . Pain in Soft Tissues of Limb 07/06/2007  . HYPERLIPIDEMIA 02/17/2007  . Depression 02/17/2007  . Essential hypertension 02/17/2007  . ALLERGIC RHINITIS 02/17/2007  . Asthma 02/17/2007  . ASTHMA NOS W/ACUTE EXACERBATION 02/17/2007  . DYSPNEA/SHORTNESS OF BREATH 02/17/2007  . SYMPTOM, INCONTINENCE, URGE 02/17/2007   Starr Lake PT, DPT, LAT, ATC  08/23/2014  9:58 AM   [facility] [address] [place], [place], [address] Phone: [phone]   Fax:  [phone]

## 2014-08-28 ENCOUNTER — Ambulatory Visit: Payer: Medicare Other | Admitting: Physical Therapy

## 2014-08-28 DIAGNOSIS — R293 Abnormal posture: Secondary | ICD-10-CM

## 2014-08-28 DIAGNOSIS — M25511 Pain in right shoulder: Secondary | ICD-10-CM

## 2014-08-28 DIAGNOSIS — R29898 Other symptoms and signs involving the musculoskeletal system: Secondary | ICD-10-CM

## 2014-08-28 NOTE — Patient Instructions (Signed)
Work on posture off and on during the day.

## 2014-08-28 NOTE — Therapy (Signed)
[place], [place], [address] Phone: [phone]   Fax:  [phone]  Physical Therapy Treatment  Patient Details  Name: Sabrina Page MRN: [id number] Date of Birth: Oct 18, 1933 Referring Provider:  Cassandria Anger, MD  Encounter Date: 08/28/2014      PT End of Session - 08/28/14 1220    Visit Number 9   Number of Visits 16   Date for PT Re-Evaluation 09/30/14   PT Start Time 0933   PT Stop Time 1018   PT Time Calculation (min) 45 min   Activity Tolerance Patient limited by pain      Past Medical History  Diagnosis Date  . Allergy     rhinitis  . Asthma   . Hyperlipidemia   . Hypertension   . Depression   . GERD (gastroesophageal reflux disease)   . Osteoarthritis   . Obesity   . Urinary incontinence   . Shoulder injury     left  . COPD (chronic obstructive pulmonary disease)   . Osteoporosis   . Gallstones   . Peripheral vascular disease     swelling   . Shortness of breath     with exertion   . Pneumonia   . Sleep apnea     never diagnosed   . Chronic kidney disease     hx of cystitis   . Dizziness     vertigo     Past Surgical History  Procedure Laterality Date  . Right left knee arthroplast    . L tkr    . Other surgical history      left leg surgery   . Other surgical history      left breast surgery due to mastitis   . Liver biopsy  09/14/2011    Procedure: LIVER BIOPSY;  Surgeon: Odis Hollingshead, MD;  Location: [facility];  Service: General;  Laterality: N/A;  . Cholecystectomy  09/14/2011    Procedure: LAPAROSCOPIC CHOLECYSTECTOMY;  Surgeon: Odis Hollingshead, MD;  Location: [facility];  Service: General;  Laterality: N/A;  attempted intraoperative cholangiogram     There were no vitals filed for this visit.  Visit Diagnosis:  Shoulder weakness  Right shoulder pain  Poor posture      Subjective Assessment - 08/28/14 0940    Subjective Tape helpful.    Pain with moving arm 3-4/10 , pain sleeping 7-8/10   Pain Score 4    Pain Location Shoulder   Pain Orientation Right   Pain Descriptors / Indicators Shooting;Sharp;Aching   Aggravating Factors  using arm, sleeping position   Pain Relieving Factors PT   Effect of Pain on Daily Activities not sleeping well   Multiple Pain Sites No                       [facility] Adult PT Treatment/Exercise - 08/28/14 0940    Shoulder Exercises: Seated   Other Seated Exercises posture exercises, rhomboids 5 reps 5 seconds hold.   Ultrasound   Ultrasound Location Rt shoulder/upper trap   Ultrasound Parameters 1.2 watts/cm2, 8 minutes   Ultrasound Goals Pain   Manual Therapy   Manual Therapy --  soft tissue work to same,  tension decreased upper trap    Other Manual Therapy taping kinesiotex 3 y"s                  PT Short Term Goals - 08/28/14 0959    PT SHORT TERM  GOAL #1   Title To be independet with basic HEP (07/31/2014)   Baseline understands   Time 4   Period Weeks   Status Achieved   PT SHORT TERM GOAL #2   Title pt will have <2/10 pain during and following RUE lifting and carrying to assit with functional progression (07/31/2014)   Baseline 3 to 6/10   Period Weeks   Status On-going   PT SHORT TERM GOAL #3   Title Pt will have > 4/5 strength with R shoulder flexors and external rotations to assist with house chores and ADLs (07/31/2014)   Baseline ER 4-/5, flexion 4/5   Period Weeks   Status Partially Met           PT Long Term Goals - 07/31/14 1244    PT LONG TERM GOAL #1   Title To be I with advanced HEP (07/31/2014)   Time 8   Period Weeks   Status New   PT LONG TERM GOAL #2   Title pt will decrease pain to <2/10 following overhead activities to assist with personal hygene (07/31/2014)   Time 8   Period Weeks   Status New   PT LONG TERM GOAL #3   Title Pt will have >4-/5 strength of the R shoulder muscles to assist with lifting and carrying activities  associated with household chores (07/31/2014)   Time 8   Period Weeks   Status New   PT LONG TERM GOAL #4   Title Pt will be able to verbalize and demonstrate techniques to reduce R shoulder reinjury via postural awareness, lifting and carrying mechanics and HEP    Time 8   Period Weeks   Status New               Plan - 08/28/14 1225    PT Next Visit Plan modalites for pain, scapular stability exercises and rotator cuff strengthening, scapular mobilizations,         Problem List Patient Active Problem List   Diagnosis Date Noted  . Morbid obesity 03/06/2014  . Right tennis elbow 01/02/2014  . Wart 01/02/2014  . Chronic fatigue 06/25/2013  . External hemorrhoid, bleeding 02/14/2013  . OSA (obstructive sleep apnea) 02/10/2012  . LBP radiating to both legs 05/14/2011  . Falls frequently 05/14/2011  . Fatigue 04/22/2011  . Hyperglycemia 04/22/2011  . Neoplasm of uncertain behavior of skin 01/11/2011  . Anxiety state 07/06/2010  . SHOULDER PAIN 05/21/2010  . CONCUSSION WITH LOSS OF CONSCIOUSNESS 05/21/2010  . Actinic keratosis 04/08/2010  . CARPAL TUNNEL SYNDROME 11/28/2009  . NECK PAIN 11/28/2009  . DIZZINESS 11/28/2009  . INSOMNIA, CHRONIC 06/23/2009  . TOBACCO USE, QUIT 04/11/2009  . DIVERTICULOSIS OF COLON 09/06/2008  . DIVERTICULITIS OF COLON 09/06/2008  . Unspecified chest pain 09/06/2008  . GERD 06/05/2008  . OSTEOARTHRITIS 06/05/2008  . ABNORMAL GLUCOSE NEC 06/05/2008  . BRONCHITIS, ACUTE 04/04/2008  . APHTHOUS STOMATITIS 03/25/2008  . VENOUS INSUFFICIENCY 11/03/2007  . Edema 11/03/2007  . SHINGLES 10/23/2007  . DENTAL PAIN 10/23/2007  . PARESTHESIA 10/23/2007  . Pain in Soft Tissues of Limb 07/06/2007  . HYPERLIPIDEMIA 02/17/2007  . Depression 02/17/2007  . Essential hypertension 02/17/2007  . ALLERGIC RHINITIS 02/17/2007  . Asthma 02/17/2007  . ASTHMA NOS W/ACUTE EXACERBATION 02/17/2007  . DYSPNEA/SHORTNESS OF BREATH 02/17/2007  . SYMPTOM,  INCONTINENCE, URGE 02/17/2007   Melvenia Needles, PTA 08/28/2014 12:29 PM Phone: [phone] Fax: [phone]  [facility] 08/28/2014, 12:29 PM  [facility]  [place], [place], [address] Phone: [phone]   Fax:  [phone]

## 2014-08-29 ENCOUNTER — Ambulatory Visit: Payer: Medicare Other | Admitting: Physical Therapy

## 2014-08-29 DIAGNOSIS — K219 Gastro-esophageal reflux disease without esophagitis: Secondary | ICD-10-CM | POA: Diagnosis not present

## 2014-08-29 DIAGNOSIS — K5900 Constipation, unspecified: Secondary | ICD-10-CM | POA: Diagnosis not present

## 2014-08-29 DIAGNOSIS — K625 Hemorrhage of anus and rectum: Secondary | ICD-10-CM | POA: Diagnosis not present

## 2014-09-02 ENCOUNTER — Ambulatory Visit: Payer: Medicare Other | Admitting: Physical Therapy

## 2014-09-02 DIAGNOSIS — M25511 Pain in right shoulder: Secondary | ICD-10-CM | POA: Diagnosis not present

## 2014-09-02 DIAGNOSIS — R29898 Other symptoms and signs involving the musculoskeletal system: Secondary | ICD-10-CM | POA: Diagnosis not present

## 2014-09-02 DIAGNOSIS — R293 Abnormal posture: Secondary | ICD-10-CM

## 2014-09-02 NOTE — Therapy (Signed)
[place], [place], [address] Phone: [phone]   Fax:  [phone]  Physical Therapy Treatment  Patient Details  Name: Sabrina Page MRN: [id number] Date of Birth: 1934/01/12 Referring Provider:  Cassandria Anger, MD  Encounter Date: 09/02/2014      PT End of Session - 09/02/14 0928    Visit Number 10   Number of Visits 16   Date for PT Re-Evaluation 09/30/14   PT Start Time 0843   PT Stop Time 0927   PT Time Calculation (min) 44 min   Activity Tolerance Patient tolerated treatment well   Behavior During Therapy [facility] for tasks assessed/performed      Past Medical History  Diagnosis Date  . Allergy     rhinitis  . Asthma   . Hyperlipidemia   . Hypertension   . Depression   . GERD (gastroesophageal reflux disease)   . Osteoarthritis   . Obesity   . Urinary incontinence   . Shoulder injury     left  . COPD (chronic obstructive pulmonary disease)   . Osteoporosis   . Gallstones   . Peripheral vascular disease     swelling   . Shortness of breath     with exertion   . Pneumonia   . Sleep apnea     never diagnosed   . Chronic kidney disease     hx of cystitis   . Dizziness     vertigo     Past Surgical History  Procedure Laterality Date  . Right left knee arthroplast    . L tkr    . Other surgical history      left leg surgery   . Other surgical history      left breast surgery due to mastitis   . Liver biopsy  09/14/2011    Procedure: LIVER BIOPSY;  Surgeon: Odis Hollingshead, MD;  Location: [facility];  Service: General;  Laterality: N/A;  . Cholecystectomy  09/14/2011    Procedure: LAPAROSCOPIC CHOLECYSTECTOMY;  Surgeon: Odis Hollingshead, MD;  Location: [facility];  Service: General;  Laterality: N/A;  attempted intraoperative cholangiogram     There were no vitals filed for this visit.  Visit Diagnosis:  Shoulder weakness  Right shoulder pain  Poor posture       Subjective Assessment - 09/02/14 0850    Subjective pt reports that she is feeling good sometimes, and is able to sleep more on that side".  She reports she wants to continue with treatment 2-3 more visits before self-discharging.    How long can you sit comfortably? >2-3 hours   How long can you stand comfortably? >2 hours   How long can you walk comfortably? >2 hours   Currently in Pain? Yes   Pain Score 0-No pain   Pain Location Shoulder   Pain Orientation Right   Pain Type Chronic pain   Pain Frequency Intermittent   Aggravating Factors  using the arm   Pain Relieving Factors PT            [facility] PT Assessment - 09/02/14 0859    Observation/Other Assessments   Focus on Therapeutic Outcomes (FOTO)  56% limitation   Strength   Right Shoulder Flexion 3+/5   Right Shoulder Extension 4-/5   Right Shoulder ABduction 4-/5   Right Shoulder Internal Rotation 4+/5   Right Shoulder External Rotation 4/5  [facility] Adult PT Treatment/Exercise - 09/02/14 0001    Shoulder Exercises: Supine   Other Supine Exercises Rhythmic stablization 2 x 10   Shoulder Exercises: Standing   Protraction AROM;Strengthening;Right;15 reps;Theraband   Theraband Level (Shoulder Protraction) Level 3 (Green)   External Rotation AROM;Strengthening;Right;15 reps   Theraband Level (Shoulder External Rotation) Level 3 (Green)   Internal Rotation AROM;Strengthening;Right;Theraband;15 reps   Theraband Level (Shoulder Internal Rotation) Level 3 (Green)   Flexion AROM;Strengthening;Right;15 reps;Theraband   Theraband Level (Shoulder Flexion) Level 3 (Green)   Row AROM;Strengthening;Both;15 reps;Theraband   Theraband Level (Shoulder Row) Level 3 Nyoka Cowden)                PT Education - 09/02/14 [phone]    Education provided Yes   Education Details continue with Shoulder exercises (pt reported not doing them as much as she should be)   Person(s) Educated Patient   Methods  Explanation   Comprehension Verbalized understanding          PT Short Term Goals - 08/28/14 0959    PT SHORT TERM GOAL #1   Title To be independet with basic HEP (07/31/2014)   Baseline understands   Time 4   Period Weeks   Status Achieved   PT SHORT TERM GOAL #2   Title pt will have <2/10 pain during and following RUE lifting and carrying to assit with functional progression (07/31/2014)   Baseline 3 to 6/10   Period Weeks   Status On-going   PT SHORT TERM GOAL #3   Title Pt will have > 4/5 strength with R shoulder flexors and external rotations to assist with house chores and ADLs (07/31/2014)   Baseline ER 4-/5, flexion 4/5   Period Weeks   Status Partially Met           PT Long Term Goals - 07/31/14 1244    PT LONG TERM GOAL #1   Title To be I with advanced HEP (07/31/2014)   Time 8   Period Weeks   Status New   PT LONG TERM GOAL #2   Title pt will decrease pain to <2/10 following overhead activities to assist with personal hygene (07/31/2014)   Time 8   Period Weeks   Status New   PT LONG TERM GOAL #3   Title Pt will have >4-/5 strength of the R shoulder muscles to assist with lifting and carrying activities associated with household chores (07/31/2014)   Time 8   Period Weeks   Status New   PT LONG TERM GOAL #4   Title Pt will be able to verbalize and demonstrate techniques to reduce R shoulder reinjury via postural awareness, lifting and carrying mechanics and HEP    Time 8   Period Weeks   Status New               Plan - 09/02/14 0928    Clinical Impression Statement Sabrina Page continues to progress with strength and AROM/ UE endurance activities increase overall shoulder strenght to 4-/5 shoulder flexion/abduction, and 4+/5 with internal/ external rotation. .  She reports sleeping better and doing more around the house.  She has intermittent pain that occurs more in the morning compared to evening, pt reports wanting to do 2-3 more visits of PT before being  discharged.    PT Next Visit Plan modalites for pain, scapular stability exercises and rotator cuff strengthening, scapular mobilizations,    PT Home Exercise Plan same as previous   Consulted and Agree with Plan  of Care Patient          G-Codes - 09/05/2014 0944    Functional Assessment Tool Used FOTO 54% limitation   Functional Limitation Carrying, moving and handling objects   Carrying, Moving and Handling Objects Current Status [phone]) At least 40 percent but less than 60 percent impaired, limited or restricted   Carrying, Moving and Handling Objects Goal Status ([id number]) At least 20 percent but less than 40 percent impaired, limited or restricted      Problem List Patient Active Problem List   Diagnosis Date Noted  . Morbid obesity 03/06/2014  . Right tennis elbow 01/02/2014  . Wart 01/02/2014  . Chronic fatigue 06/25/2013  . External hemorrhoid, bleeding 02/14/2013  . OSA (obstructive sleep apnea) 02/10/2012  . LBP radiating to both legs 05/14/2011  . Falls frequently 05/14/2011  . Fatigue 04/22/2011  . Hyperglycemia 04/22/2011  . Neoplasm of uncertain behavior of skin 01/11/2011  . Anxiety state 07/06/2010  . SHOULDER PAIN 05/21/2010  . CONCUSSION WITH LOSS OF CONSCIOUSNESS 05/21/2010  . Actinic keratosis 04/08/2010  . CARPAL TUNNEL SYNDROME 11/28/2009  . NECK PAIN 11/28/2009  . DIZZINESS 11/28/2009  . INSOMNIA, CHRONIC 06/23/2009  . TOBACCO USE, QUIT 04/11/2009  . DIVERTICULOSIS OF COLON 09/06/2008  . DIVERTICULITIS OF COLON 09/06/2008  . Unspecified chest pain 09/06/2008  . GERD 06/05/2008  . OSTEOARTHRITIS 06/05/2008  . ABNORMAL GLUCOSE NEC 06/05/2008  . BRONCHITIS, ACUTE 04/04/2008  . APHTHOUS STOMATITIS 03/25/2008  . VENOUS INSUFFICIENCY 11/03/2007  . Edema 11/03/2007  . SHINGLES 10/23/2007  . DENTAL PAIN 10/23/2007  . PARESTHESIA 10/23/2007  . Pain in Soft Tissues of Limb 07/06/2007  . HYPERLIPIDEMIA 02/17/2007  . Depression 02/17/2007  . Essential  hypertension 02/17/2007  . ALLERGIC RHINITIS 02/17/2007  . Asthma 02/17/2007  . ASTHMA NOS W/ACUTE EXACERBATION 02/17/2007  . DYSPNEA/SHORTNESS OF BREATH 02/17/2007  . SYMPTOM, INCONTINENCE, URGE 02/17/2007   Starr Lake PT, DPT, LAT, ATC  09/05/14  9:48 AM   [facility] [address] [place], [place], [address] Phone: [phone]   Fax:  [phone]

## 2014-09-04 ENCOUNTER — Ambulatory Visit: Payer: Medicare Other | Admitting: Physical Therapy

## 2014-09-04 DIAGNOSIS — R29898 Other symptoms and signs involving the musculoskeletal system: Secondary | ICD-10-CM

## 2014-09-04 DIAGNOSIS — R293 Abnormal posture: Secondary | ICD-10-CM | POA: Diagnosis not present

## 2014-09-04 DIAGNOSIS — M25511 Pain in right shoulder: Secondary | ICD-10-CM | POA: Diagnosis not present

## 2014-09-04 NOTE — Therapy (Addendum)
[place], [place], [address] Phone: [phone]   Fax:  [phone]  Physical Therapy Treatment  Patient Details  Name: Sabrina Page MRN: [id number] Date of Birth: 1933-05-27 Referring Provider:  Cassandria Anger, MD  Encounter Date: 09/04/2014      PT End of Session - 09/04/14 0922    Visit Number 11   Number of Visits 16   Date for PT Re-Evaluation 09/30/14   PT Start Time 0845   PT Stop Time 0923   PT Time Calculation (min) 38 min   Activity Tolerance Patient limited by fatigue      Past Medical History  Diagnosis Date  . Allergy     rhinitis  . Asthma   . Hyperlipidemia   . Hypertension   . Depression   . GERD (gastroesophageal reflux disease)   . Osteoarthritis   . Obesity   . Urinary incontinence   . Shoulder injury     left  . COPD (chronic obstructive pulmonary disease)   . Osteoporosis   . Gallstones   . Peripheral vascular disease     swelling   . Shortness of breath     with exertion   . Pneumonia   . Sleep apnea     never diagnosed   . Chronic kidney disease     hx of cystitis   . Dizziness     vertigo     Past Surgical History  Procedure Laterality Date  . Right left knee arthroplast    . L tkr    . Other surgical history      left leg surgery   . Other surgical history      left breast surgery due to mastitis   . Liver biopsy  09/14/2011    Procedure: LIVER BIOPSY;  Surgeon: Odis Hollingshead, MD;  Location: [facility];  Service: General;  Laterality: N/A;  . Cholecystectomy  09/14/2011    Procedure: LAPAROSCOPIC CHOLECYSTECTOMY;  Surgeon: Odis Hollingshead, MD;  Location: [facility];  Service: General;  Laterality: N/A;  attempted intraoperative cholangiogram     There were no vitals filed for this visit.  Visit Diagnosis:  Shoulder weakness      Subjective Assessment - 09/04/14 0853    Subjective Walked a lot yesterday and used a walker her hands  and arms are sore.  Does not feel like a lit of exercises,.   Currently in Pain? Yes   Pain Score 0-No pain   Pain Location Shoulder   Pain Orientation Right   Pain Descriptors / Indicators --  whole body fatigued, not sleeping well.                       [facility] Adult PT Treatment/Exercise - 09/04/14 0854    Shoulder Exercises: Supine   Theraband Level (Shoulder Horizontal ABduction) Level 1 (Yellow)   Theraband Level (Shoulder External Rotation) Level 1 (Yellow)   Shoulder Exercises: Seated   Theraband Level (Shoulder Extension) Level 1 (Yellow)   Theraband Level (Shoulder Horizontal ABduction) Level 1 (Yellow)   Theraband Level (Shoulder External Rotation) Level 1 (Yellow)   Theraband Level (Shoulder Flexion) Level 1 (Yellow)   Shoulder Exercises: Isometric Strengthening   Flexion Limitations 10X5   Extension --  10 X 5   External Rotation Limitations 10X5   Internal Rotation Limitations 10X5   ABduction --  10X5     Dizzy upon sitting. Yesterday too.  Advised patient  to call MD.  She refused need of BP taken.             PT Short Term Goals - 09/04/14 0926    PT SHORT TERM GOAL #1   Title To be independet with basic HEP (07/31/2014)   Status Achieved   PT SHORT TERM GOAL #2   Title pt will have <2/10 pain during and following RUE lifting and carrying to assit with functional progression (07/31/2014)   Baseline 4/10   Time 8   Period Weeks   Status On-going   PT SHORT TERM GOAL #3   Title Pt will have > 4/5 strength with R shoulder flexors and external rotations to assist with house chores and ADLs (07/31/2014)   Time 8   Period Weeks   Status On-going           PT Long Term Goals - 09/04/14 0926    PT LONG TERM GOAL #1   Title To be I with advanced HEP (07/31/2014)   Time 8   Period Weeks   Status On-going   PT LONG TERM GOAL #2   Title pt will decrease pain to <2/10 following overhead activities to assist with personal hygene (07/31/2014)    Time 8   Period Weeks   Status On-going   PT LONG TERM GOAL #3   Title Pt will have >4-/5 strength of the R shoulder muscles to assist with lifting and carrying activities associated with household chores (07/31/2014)   Time 8   Period Weeks   Status On-going   PT LONG TERM GOAL #4   Title Pt will be able to verbalize and demonstrate techniques to reduce R shoulder reinjury via postural awareness, lifting and carrying mechanics and HEP    Time 8   Period Weeks   Status On-going               Plan - 09/04/14 7680    Clinical Impression Statement fatigued today.  Supine exercises only due to patient's request of and easier session.  4/10 pain with some ex which were resolved with rest.  Declined the need of ice or heat.  She thinks she will be ready for D/C after next 2 visits scheduled.  She can now use her arm more at home for cooking.   PT Next Visit Plan Finalize home exercise program   Consulted and Agree with Plan of Care Patient        Problem List Patient Active Problem List   Diagnosis Date Noted  . Morbid obesity 03/06/2014  . Right tennis elbow 01/02/2014  . Wart 01/02/2014  . Chronic fatigue 06/25/2013  . External hemorrhoid, bleeding 02/14/2013  . OSA (obstructive sleep apnea) 02/10/2012  . LBP radiating to both legs 05/14/2011  . Falls frequently 05/14/2011  . Fatigue 04/22/2011  . Hyperglycemia 04/22/2011  . Neoplasm of uncertain behavior of skin 01/11/2011  . Anxiety state 07/06/2010  . SHOULDER PAIN 05/21/2010  . CONCUSSION WITH LOSS OF CONSCIOUSNESS 05/21/2010  . Actinic keratosis 04/08/2010  . CARPAL TUNNEL SYNDROME 11/28/2009  . NECK PAIN 11/28/2009  . DIZZINESS 11/28/2009  . INSOMNIA, CHRONIC 06/23/2009  . TOBACCO USE, QUIT 04/11/2009  . DIVERTICULOSIS OF COLON 09/06/2008  . DIVERTICULITIS OF COLON 09/06/2008  . Unspecified chest pain 09/06/2008  . GERD 06/05/2008  . OSTEOARTHRITIS 06/05/2008  . ABNORMAL GLUCOSE NEC 06/05/2008  .  BRONCHITIS, ACUTE 04/04/2008  . APHTHOUS STOMATITIS 03/25/2008  . VENOUS INSUFFICIENCY 11/03/2007  . Edema 11/03/2007  .  SHINGLES 10/23/2007  . DENTAL PAIN 10/23/2007  . PARESTHESIA 10/23/2007  . Pain in limb 07/06/2007  . HYPERLIPIDEMIA 02/17/2007  . Depression 02/17/2007  . Essential hypertension 02/17/2007  . ALLERGIC RHINITIS 02/17/2007  . Asthma 02/17/2007  . ASTHMA NOS W/ACUTE EXACERBATION 02/17/2007  . DYSPNEA/SHORTNESS OF BREATH 02/17/2007  . SYMPTOM, INCONTINENCE, URGE 02/17/2007   Melvenia Needles, PTA 09/04/2014 9:29 AM Phone: [phone] Fax: [phone]  [facility] 09/04/2014, 9:29 AM  [facility] [address] [place], [place], [address] Phone: [phone]   Fax:  [phone]       PHYSICAL THERAPY DISCHARGE SUMMARY  Visits from Start of Care: 11  Current functional level related to goals / functional outcomes: See goals   Remaining deficits: Limited shoulder mobility, strength, abnormal posture.    Education / Equipment: HEP  Plan: Patient agrees to discharge.  Patient goals were not met. Patient is being discharged due to not returning since the last visit.  ?????        Kristoffer Leamon PT, DPT, LAT, ATC  04/01/2015  5:34 PM

## 2014-09-09 ENCOUNTER — Ambulatory Visit: Payer: Medicare Other | Admitting: Physical Therapy

## 2014-09-10 ENCOUNTER — Other Ambulatory Visit: Payer: Self-pay | Admitting: Internal Medicine

## 2014-09-11 ENCOUNTER — Ambulatory Visit: Payer: Medicare Other | Admitting: Physical Therapy

## 2014-09-13 ENCOUNTER — Ambulatory Visit (INDEPENDENT_AMBULATORY_CARE_PROVIDER_SITE_OTHER): Payer: Medicare Other | Admitting: Internal Medicine

## 2014-09-13 ENCOUNTER — Encounter: Payer: Self-pay | Admitting: Internal Medicine

## 2014-09-13 ENCOUNTER — Other Ambulatory Visit (INDEPENDENT_AMBULATORY_CARE_PROVIDER_SITE_OTHER): Payer: Medicare Other

## 2014-09-13 VITALS — BP 128/82 | Resp 46 | Wt 238.0 lb

## 2014-09-13 DIAGNOSIS — M79605 Pain in left leg: Secondary | ICD-10-CM

## 2014-09-13 DIAGNOSIS — R739 Hyperglycemia, unspecified: Secondary | ICD-10-CM

## 2014-09-13 DIAGNOSIS — M79604 Pain in right leg: Secondary | ICD-10-CM

## 2014-09-13 DIAGNOSIS — S060X0A Concussion without loss of consciousness, initial encounter: Secondary | ICD-10-CM | POA: Diagnosis not present

## 2014-09-13 DIAGNOSIS — M545 Low back pain: Secondary | ICD-10-CM

## 2014-09-13 DIAGNOSIS — I10 Essential (primary) hypertension: Secondary | ICD-10-CM

## 2014-09-13 DIAGNOSIS — R5382 Chronic fatigue, unspecified: Secondary | ICD-10-CM | POA: Diagnosis not present

## 2014-09-13 DIAGNOSIS — G4733 Obstructive sleep apnea (adult) (pediatric): Secondary | ICD-10-CM

## 2014-09-13 LAB — BASIC METABOLIC PANEL
BUN: 14 mg/dL (ref 6–23)
CO2: 33 mEq/L — ABNORMAL HIGH (ref 19–32)
Calcium: 9.2 mg/dL (ref 8.4–10.5)
Chloride: 102 mEq/L (ref 96–112)
Creatinine, Ser: 0.88 mg/dL (ref 0.40–1.20)
GFR: 65.89 mL/min (ref >60.00)
Glucose, Bld: 83 mg/dL (ref 70–99)
Potassium: 4.3 mEq/L (ref 3.5–5.1)
Sodium: 138 mEq/L (ref 135–145)

## 2014-09-13 LAB — HEMOGLOBIN A1C: Hgb A1c MFr Bld: 5.8 % (ref 4.6–6.5)

## 2014-09-13 LAB — TSH: TSH: 1.78 u[IU]/mL (ref 0.35–4.50)

## 2014-09-13 MED ORDER — TRAMADOL HCL 50 MG PO TABS: 50.0000 mg | ORAL_TABLET | Freq: Four times a day (QID) | ORAL | Status: DC | PRN

## 2014-09-13 MED ORDER — MOMETASONE FURO-FORMOTEROL FUM 200-5 MCG/ACT IN AERO: 2.0000 | INHALATION_SPRAY | RESPIRATORY_TRACT | Status: DC

## 2014-09-13 MED ORDER — TOPIRAMATE 25 MG PO TABS
25.0000 mg | ORAL_TABLET | Freq: Two times a day (BID) | ORAL | Status: DC
Start: 2014-09-13 — End: 2015-01-22

## 2014-09-13 NOTE — Progress Notes (Signed)
Subjective:     HPI  C/o hitting a freezer door w/her head 1 wk ago: dizzy, nausea. No LOC. C/o L flank swelling F/u OSA. Unable to loose wt. Trying CPAP. C/o fatigue    F/u cramps in hands F/u memory issues - same No falls F/u SOB at night, ?snoring; tired in am No CP F/u hemorrhoid w/bleeding - resolved. It was painful, bled a lot Less pain today She had a colonoscopy 7-8 y ago with Dr Collene Mares  F/u worse LBP - better; gained wt   Past Medical History  Diagnosis Date  . Allergy     rhinitis  . Asthma   . Hyperlipidemia   . Hypertension   . Depression   . GERD (gastroesophageal reflux disease)   . Osteoarthritis   . Obesity   . Urinary incontinence   . Shoulder injury     left  . COPD (chronic obstructive pulmonary disease)   . Osteoporosis   . Gallstones   . Peripheral vascular disease     swelling   . Shortness of breath     with exertion   . Pneumonia   . Sleep apnea     never diagnosed   . Chronic kidney disease     hx of cystitis   . Dizziness     vertigo    Past Surgical History  Procedure Laterality Date  . Right left knee arthroplast    . L tkr    . Other surgical history      left leg surgery   . Other surgical history      left breast surgery due to mastitis   . Liver biopsy  09/14/2011    Procedure: LIVER BIOPSY;  Surgeon: Odis Hollingshead, MD;  Location: [facility];  Service: General;  Laterality: N/A;  . Cholecystectomy  09/14/2011    Procedure: LAPAROSCOPIC CHOLECYSTECTOMY;  Surgeon: Odis Hollingshead, MD;  Location: [facility];  Service: General;  Laterality: N/A;  attempted intraoperative cholangiogram     reports that she quit smoking about 13 years ago. Her smoking use included Cigarettes. She has a 17.5 pack-year smoking history. She has never used smokeless tobacco. She reports that she drinks alcohol. She reports that she does not use illicit drugs. family history includes Arthritis in her mother; Diabetes in her mother; Hypertension in  her father, mother, and other; Stroke in her father and mother. There is no history of CAD. Allergies  Allergen Reactions  . Atorvastatin     REACTION: upset stomach  . Enalapril Maleate   . Hctz [Hydrochlorothiazide]     Dizzy   . Lovastatin     REACTION: tongue stress  . Simvastatin     REACTION: mouth sores   Current Outpatient Prescriptions on File Prior to Visit  Medication Sig Dispense Refill  . aspirin 81 MG tablet Take 81 mg by mouth every morning.     . B Complex-C (SUPER B COMPLEX PO) Take 1 tablet by mouth every morning.     . Cholecalciferol 1000 UNITS capsule Take 1,000 Units by mouth every morning.     . clonazePAM (KLONOPIN) 0.25 MG disintegrating tablet Take 1 tablet (0.25 mg total) by mouth 2 (two) times daily as needed (insomnia or anxiety). Insomnia 60 tablet 2  . FLUoxetine (PROZAC) 10 MG tablet TAKE 2 TABLETS BY MOUTH DAILY 60 tablet 5  . indapamide (LOZOL) 1.25 MG tablet TAKE 1 TABLET BY MOUTH EVERY MORNING FOR SWELLING 30 tablet 5  . KLOR-CON  8 MEQ tablet TAKE 1 TABLET EVERY MORNING 30 tablet 5  . loratadine (CLARITIN) 10 MG tablet Take 1 tablet (10 mg total) by mouth every morning. 100 tablet 3  . mometasone-formoterol (DULERA) 200-5 MCG/ACT AERO Inhale 2 puffs into the lungs 1 day or 1 dose. 1 Inhaler 11  . Omega-3 Fatty Acids (FISH OIL) 1000 MG CAPS Take 1 capsule by mouth every morning.     Marland Kitchen omeprazole (PRILOSEC) 20 MG capsule TAKE 2 CAPSULES BY MOUTH DAILY 60 capsule 5  . potassium chloride (KLOR-CON 10) 10 MEQ tablet Take 1 tablet (10 mEq total) by mouth daily. 30 tablet 11  . predniSONE (DELTASONE) 5 MG tablet Take 5 mg by mouth daily with breakfast.    . predniSONE (STERAPRED UNI-PAK) 5 MG TABS tablet Take by mouth.    . traMADol (ULTRAM) 50 MG tablet Take 1 tablet (50 mg total) by mouth every 6 (six) hours as needed. TAKE 1 TABLET BY MOUTH EVERY 8 HOURS AS NEEDED FOR PAIN 120 tablet 2   No current facility-administered medications on file prior to  visit.   BP 128/82 mmHg  Resp 46  Wt 238 lb (107.956 kg)  SpO2 95%   Review of Systems  Constitutional: Negative for fever, chills, activity change, appetite change, fatigue and unexpected weight change.  HENT: Negative for congestion, mouth sores and sinus pressure.   Eyes: Negative for visual disturbance.  Respiratory: Negative for cough and chest tightness.   Gastrointestinal: Negative for nausea and abdominal pain.  Genitourinary: Negative for frequency, difficulty urinating and vaginal pain.  Musculoskeletal: Positive for gait problem. Negative for myalgias and back pain.  Skin: Negative for pallor and rash.  Neurological: Negative for dizziness, tremors, weakness, numbness and headaches.  Psychiatric/Behavioral: Negative for confusion and sleep disturbance. The patient is nervous/anxious.    Wt Readings from Last 3 Encounters:  09/13/14 238 lb (107.956 kg)  07/05/14 234 lb (106.142 kg)  06/12/14 231 lb 8 oz (105.008 kg)   BP Readings from Last 3 Encounters:  09/13/14 128/82  07/05/14 124/76  06/12/14 130/84       Objective:   Physical Exam  Constitutional: She appears well-developed. No distress.  HENT:  Head: Normocephalic.  Right Ear: External ear normal.  Left Ear: External ear normal.  Nose: Nose normal.  Mouth/Throat: Oropharynx is clear and moist.  Eyes: Conjunctivae are normal. Pupils are equal, round, and reactive to light. Right eye exhibits no discharge. Left eye exhibits no discharge.  Neck: Normal range of motion. Neck supple. No JVD present. No tracheal deviation present. No thyromegaly present.  Cardiovascular: Normal rate, regular rhythm and normal heart sounds.   Pulmonary/Chest: No stridor. No respiratory distress. She has no wheezes.  Abdominal: Soft. Bowel sounds are normal. She exhibits no distension and no mass. There is no tenderness. There is no rebound and no guarding.  Musculoskeletal: She exhibits no edema or tenderness.  Lymphadenopathy:     She has no cervical adenopathy.  Neurological: She displays abnormal reflex. No cranial nerve deficit. She exhibits normal muscle tone. Coordination abnormal.  Skin: No rash noted. No erythema.  Psychiatric: She has a normal mood and affect. Her behavior is normal. Judgment and thought content normal.  Scalp - tende LS is less tender L flank large lipoma  Lab Results  Component Value Date   WBC 6.0 06/12/2014   HGB 15.3* 06/12/2014   HCT 45.1 06/12/2014   PLT 186.0 06/12/2014   GLUCOSE 119* 06/12/2014   CHOL 270* 06/12/2014  TRIG 132.0 06/12/2014   HDL 52.40 06/12/2014   LDLDIRECT 159.1 05/16/2012   LDLCALC 191* 06/12/2014   ALT 19 06/12/2014   AST 20 06/12/2014   NA 138 06/12/2014   K 3.8 06/12/2014   CL 98 06/12/2014   CREATININE 1.04 06/12/2014   BUN 18 06/12/2014   CO2 32 06/12/2014   TSH 1.79 06/12/2014   INR 1.07 09/06/2011   HGBA1C 5.9 06/12/2014      MRI 1/13: IMPRESSION:  1. Severe multifactorial spinal and lateral recess stenosis at L4-  L5 plus moderate right foraminal stenosis.  2. Borderline to mild multifactorial spinal stenosis at T11-T12  and L3-L4.  3. Mild to moderate multifactorial left L5 foraminal stenosis.   Original Report Authenticated By: Randall An, M.D. Clinical Data: [place]. Hit head.    CT HEAD WITHOUT CONTRAST 12/11 CT CERVICAL SPINE WITHOUT CONTRAST  Technique: Multidetector CT imaging of the head and cervical spine  was performed following the standard protocol without intravenous  contrast. Multiplanar CT image reconstructions of the cervical  spine were also generated.  Comparison: None  CT HEAD  Findings: There is mild age related cerebral atrophy and  ventriculomegaly. No extra-axial fluid collections are seen. No  CT findings for hemispheric infarction or intracranial hemorrhage.  No mass lesions. The brainstem and cerebellum are unremarkable.  The bony calvarium is intact. Benign appearing calvarial lesion  noted  in the right frontal area. The paranasal sinuses and mastoid  air cells are clear. The globes are intact.  IMPRESSION:  1. Age related cerebral atrophy and ventriculomegaly.  2. No acute intracranial findings or mass lesions.  3. No skull fracture.  CT CERVICAL SPINE  Findings: Advanced degenerative cervical spondylosis with disc  disease and facet disease most notable at C4-5, C5-6 and C6-7. The  facets are normally aligned. No facet or laminar fractures. No  acute vertebral body fractures or abnormal prevertebral soft tissue  swelling. The C1-2 articulations are maintained. The dens appears  normal.  No large disc protrusions are seen. There is moderate osteophytic  spurring posteriorly at C5-6 and C6-7 with mild mass effect on the  thecal sac. Mild bilateral foraminal stenosis at C5-6 and C6-7.  IMPRESSION:  1. Degenerative cervical spondylosis with disc disease and facet  disease.  2. No acute bony findings and normal alignment.   Provider: Durene Fruits   Lab Results  Component Value Date   WBC 6.0 06/12/2014   HGB 15.3* 06/12/2014   HCT 45.1 06/12/2014   PLT 186.0 06/12/2014   GLUCOSE 119* 06/12/2014   CHOL 270* 06/12/2014   TRIG 132.0 06/12/2014   HDL 52.40 06/12/2014   LDLDIRECT 159.1 05/16/2012   LDLCALC 191* 06/12/2014   ALT 19 06/12/2014   AST 20 06/12/2014   NA 138 06/12/2014   K 3.8 06/12/2014   CL 98 06/12/2014   CREATININE 1.04 06/12/2014   BUN 18 06/12/2014   CO2 32 06/12/2014   TSH 1.79 06/12/2014   INR 1.07 09/06/2011   HGBA1C 5.9 06/12/2014             Assessment & Plan:

## 2014-09-13 NOTE — Assessment & Plan Note (Signed)
Indapamide po

## 2014-09-13 NOTE — Assessment & Plan Note (Signed)
Labs

## 2014-09-13 NOTE — Assessment & Plan Note (Signed)
Risks associated with CPAP treatment noncompliance were discussed. Compliance was encouraged.

## 2014-09-13 NOTE — Assessment & Plan Note (Signed)
Spinal stenosis. Tramadol prn Wt loss need discussed again

## 2014-09-13 NOTE — Assessment & Plan Note (Signed)
09/06/14 - mild

## 2014-09-13 NOTE — Progress Notes (Signed)
Pre visit review using our clinic review tool, if applicable. No additional management support is needed unless otherwise documented below in the visit note. 

## 2014-09-13 NOTE — Assessment & Plan Note (Signed)
Risks associated with treatment diet noncompliance were discussed. Compliance was encouraged.

## 2014-11-18 ENCOUNTER — Other Ambulatory Visit: Payer: Self-pay

## 2014-12-20 ENCOUNTER — Ambulatory Visit (INDEPENDENT_AMBULATORY_CARE_PROVIDER_SITE_OTHER): Payer: Medicare Other | Admitting: Internal Medicine

## 2014-12-20 ENCOUNTER — Encounter: Payer: Self-pay | Admitting: Internal Medicine

## 2014-12-20 ENCOUNTER — Ambulatory Visit: Payer: Medicare Other | Admitting: Internal Medicine

## 2014-12-20 VITALS — BP 110/70 | HR 79 | Wt 235.0 lb

## 2014-12-20 DIAGNOSIS — J4520 Mild intermittent asthma, uncomplicated: Secondary | ICD-10-CM

## 2014-12-20 DIAGNOSIS — G4733 Obstructive sleep apnea (adult) (pediatric): Secondary | ICD-10-CM

## 2014-12-20 DIAGNOSIS — I872 Venous insufficiency (chronic) (peripheral): Secondary | ICD-10-CM

## 2014-12-20 MED ORDER — CLONAZEPAM 0.5 MG PO TABS: 0.5000 mg | ORAL_TABLET | Freq: Two times a day (BID) | ORAL | Status: DC | PRN

## 2014-12-20 MED ORDER — MOMETASONE FURO-FORMOTEROL FUM 200-5 MCG/ACT IN AERO: 2.0000 | INHALATION_SPRAY | RESPIRATORY_TRACT | Status: DC

## 2014-12-20 NOTE — Assessment & Plan Note (Signed)
On Dulera 

## 2014-12-20 NOTE — Progress Notes (Signed)
Pre visit review using our clinic review tool, if applicable. No additional management support is needed unless otherwise documented below in the visit note. 

## 2014-12-20 NOTE — Assessment & Plan Note (Signed)
7/16 - not using CPAP Risks associated with CPAP treatment noncompliance were discussed. Compliance was encouraged. Restart CPAP

## 2014-12-20 NOTE — Progress Notes (Signed)
Subjective:  Patient ID: Sabrina Page, female    DOB: [birth year]  Age: 81 y.o. MRN: [id number]  CC: No chief complaint on file.   HPI Sabrina Page presents for asthma, HTN, obesity. Not feeling well especially in am. Not using a CPAP. Using a cane or a walker. Not sleeping  Outpatient Prescriptions Prior to Visit  Medication Sig Dispense Refill  . aspirin 81 MG tablet Take 81 mg by mouth every morning.     . B Complex-C (SUPER B COMPLEX PO) Take 1 tablet by mouth every morning.     . Cholecalciferol 1000 UNITS capsule Take 1,000 Units by mouth every morning.     . clonazePAM (KLONOPIN) 0.25 MG disintegrating tablet Take 1 tablet (0.25 mg total) by mouth 2 (two) times daily as needed (insomnia or anxiety). Insomnia 60 tablet 2  . FLUoxetine (PROZAC) 10 MG tablet TAKE 2 TABLETS BY MOUTH DAILY 60 tablet 5  . indapamide (LOZOL) 1.25 MG tablet TAKE 1 TABLET BY MOUTH EVERY MORNING FOR SWELLING 30 tablet 5  . KLOR-CON 8 MEQ tablet TAKE 1 TABLET EVERY MORNING 30 tablet 5  . loratadine (CLARITIN) 10 MG tablet Take 1 tablet (10 mg total) by mouth every morning. 100 tablet 3  . mometasone-formoterol (DULERA) 200-5 MCG/ACT AERO Inhale 2 puffs into the lungs 1 day or 1 dose. 1 Inhaler 11  . Omega-3 Fatty Acids (FISH OIL) 1000 MG CAPS Take 1 capsule by mouth every morning.     Marland Kitchen omeprazole (PRILOSEC) 20 MG capsule TAKE 2 CAPSULES BY MOUTH DAILY 60 capsule 5  . potassium chloride (KLOR-CON 10) 10 MEQ tablet Take 1 tablet (10 mEq total) by mouth daily. 30 tablet 11  . topiramate (TOPAMAX) 25 MG tablet Take 1 tablet (25 mg total) by mouth 2 (two) times daily. 60 tablet 5  . traMADol (ULTRAM) 50 MG tablet Take 1 tablet (50 mg total) by mouth every 6 (six) hours as needed. TAKE 1 TABLET BY MOUTH EVERY 8 HOURS AS NEEDED FOR PAIN 120 tablet 2   No facility-administered medications prior to visit.    ROS Review of Systems  Constitutional: Positive for fatigue. Negative for fever,  chills, activity change, appetite change and unexpected weight change.  HENT: Negative for congestion, mouth sores and sinus pressure.   Eyes: Negative for visual disturbance.  Respiratory: Positive for cough. Negative for chest tightness.   Gastrointestinal: Negative for nausea and abdominal pain.  Genitourinary: Negative for dysuria, frequency, difficulty urinating and vaginal pain.  Musculoskeletal: Positive for back pain and arthralgias. Negative for gait problem.  Skin: Negative for pallor and rash.  Neurological: Negative for dizziness, tremors, weakness, numbness and headaches.  Psychiatric/Behavioral: Positive for sleep disturbance and decreased concentration. Negative for suicidal ideas and confusion. The patient is nervous/anxious.     Objective:  BP 110/70 mmHg  Pulse 79  Wt 235 lb (106.595 kg)  SpO2 99%  BP Readings from Last 3 Encounters:  12/20/14 110/70  09/13/14 128/82  07/05/14 124/76    Wt Readings from Last 3 Encounters:  12/20/14 235 lb (106.595 kg)  09/13/14 238 lb (107.956 kg)  07/05/14 234 lb (106.142 kg)    Physical Exam  Constitutional: She appears well-developed. No distress.  HENT:  Head: Normocephalic.  Right Ear: External ear normal.  Left Ear: External ear normal.  Nose: Nose normal.  Mouth/Throat: Oropharynx is clear and moist.  Eyes: Conjunctivae are normal. Pupils are equal, round, and reactive to light. Right eye exhibits no discharge. Left eye  exhibits no discharge.  Neck: Normal range of motion. Neck supple. No JVD present. No tracheal deviation present. No thyromegaly present.  Cardiovascular: Normal rate, regular rhythm and normal heart sounds.   Pulmonary/Chest: No stridor. No respiratory distress. She has no wheezes.  Abdominal: Soft. Bowel sounds are normal. She exhibits no distension and no mass. There is no tenderness. There is no rebound and no guarding.  Musculoskeletal: She exhibits edema and tenderness.  Lymphadenopathy:     She has no cervical adenopathy.  Neurological: She displays normal reflexes. No cranial nerve deficit. She exhibits normal muscle tone. Coordination abnormal.  Skin: No rash noted. No erythema.  Psychiatric: She has a normal mood and affect. Her behavior is normal. Judgment and thought content normal.  Obese Using a cane  Lab Results  Component Value Date   WBC 6.0 06/12/2014   HGB 15.3* 06/12/2014   HCT 45.1 06/12/2014   PLT 186.0 06/12/2014   GLUCOSE 83 09/13/2014   CHOL 270* 06/12/2014   TRIG 132.0 06/12/2014   HDL 52.40 06/12/2014   LDLDIRECT 159.1 05/16/2012   LDLCALC 191* 06/12/2014   ALT 19 06/12/2014   AST 20 06/12/2014   NA 138 09/13/2014   K 4.3 09/13/2014   CL 102 09/13/2014   CREATININE 0.88 09/13/2014   BUN 14 09/13/2014   CO2 33* 09/13/2014   TSH 1.78 09/13/2014   INR 1.07 09/06/2011   HGBA1C 5.8 09/13/2014    Mm Digital Screening Bilateral  04/12/2014   CLINICAL DATA:  Screening.  EXAM: DIGITAL SCREENING BILATERAL MAMMOGRAM WITH CAD  COMPARISON:  04/05/2013, 04/03/2012 and 03/29/2011  ACR Breast Density Category b: There are scattered areas of fibroglandular density.  FINDINGS: There are no findings suspicious for malignancy. Images were processed with CAD.  IMPRESSION: No mammographic evidence of malignancy. A result letter of this screening mammogram will be mailed directly to the patient.  RECOMMENDATION: Screening mammogram in one year. (Code:SM-B-01Y)  BI-RADS CATEGORY  1: Negative.   Electronically Signed   By: Rosemarie Ax   On: 04/12/2014 16:04    Assessment & Plan:   There are no diagnoses linked to this encounter. I am having Ms. Speas maintain her aspirin, Fish Oil, Cholecalciferol, B Complex-C (SUPER B COMPLEX PO), potassium chloride, omeprazole, loratadine, clonazePAM, indapamide, FLUoxetine, KLOR-CON, traMADol, mometasone-formoterol, and topiramate.  No orders of the defined types were placed in this encounter.     Follow-up: No  Follow-up on file.  [name], MD

## 2014-12-20 NOTE — Assessment & Plan Note (Signed)
Loose wt Use CPAP

## 2014-12-26 ENCOUNTER — Telehealth: Payer: Self-pay | Admitting: *Deleted

## 2014-12-26 DIAGNOSIS — J4540 Moderate persistent asthma, uncomplicated: Secondary | ICD-10-CM

## 2014-12-26 MED ORDER — FLUTICASONE FUROATE-VILANTEROL 200-25 MCG/INH IN AEPB: 1.0000 | INHALATION_SPRAY | Freq: Every day | RESPIRATORY_TRACT | Status: DC

## 2014-12-26 NOTE — Telephone Encounter (Signed)
done

## 2014-12-26 NOTE — Telephone Encounter (Signed)
What is she using this for?

## 2014-12-26 NOTE — Telephone Encounter (Signed)
Received fax stating Sabrina Page is not covered. Can you advise on an alternative in PCP's absence? Thanks!

## 2014-12-26 NOTE — Telephone Encounter (Signed)
Asthma, chronic, mild intermittent, uncomplicated

## 2014-12-30 ENCOUNTER — Telehealth: Payer: Self-pay | Admitting: Internal Medicine

## 2014-12-30 DIAGNOSIS — H8113 Benign paroxysmal vertigo, bilateral: Secondary | ICD-10-CM

## 2014-12-30 NOTE — Telephone Encounter (Signed)
Daughter has called back.  She is requesting referral now go to [facility].

## 2014-12-30 NOTE — Telephone Encounter (Signed)
Is requesting home health order to be sent to [facility] for specialist with Vertigo.  States patient has fallen three times within the last week due to vertigo.

## 2014-12-30 NOTE — Telephone Encounter (Signed)
Ok Thx 

## 2015-01-01 DIAGNOSIS — Z7982 Long term (current) use of aspirin: Secondary | ICD-10-CM | POA: Diagnosis not present

## 2015-01-01 DIAGNOSIS — H8113 Benign paroxysmal vertigo, bilateral: Secondary | ICD-10-CM | POA: Diagnosis not present

## 2015-01-01 DIAGNOSIS — Z9181 History of falling: Secondary | ICD-10-CM | POA: Diagnosis not present

## 2015-01-01 DIAGNOSIS — R531 Weakness: Secondary | ICD-10-CM | POA: Diagnosis not present

## 2015-01-03 ENCOUNTER — Ambulatory Visit: Payer: Medicare Other | Admitting: Pulmonary Disease

## 2015-01-03 DIAGNOSIS — R531 Weakness: Secondary | ICD-10-CM | POA: Diagnosis not present

## 2015-01-03 DIAGNOSIS — Z7982 Long term (current) use of aspirin: Secondary | ICD-10-CM | POA: Diagnosis not present

## 2015-01-03 DIAGNOSIS — H8113 Benign paroxysmal vertigo, bilateral: Secondary | ICD-10-CM | POA: Diagnosis not present

## 2015-01-03 DIAGNOSIS — Z9181 History of falling: Secondary | ICD-10-CM | POA: Diagnosis not present

## 2015-01-04 ENCOUNTER — Other Ambulatory Visit: Payer: Self-pay | Admitting: Internal Medicine

## 2015-01-06 DIAGNOSIS — H8113 Benign paroxysmal vertigo, bilateral: Secondary | ICD-10-CM | POA: Diagnosis not present

## 2015-01-06 DIAGNOSIS — Z7982 Long term (current) use of aspirin: Secondary | ICD-10-CM | POA: Diagnosis not present

## 2015-01-06 DIAGNOSIS — R531 Weakness: Secondary | ICD-10-CM | POA: Diagnosis not present

## 2015-01-06 DIAGNOSIS — Z9181 History of falling: Secondary | ICD-10-CM | POA: Diagnosis not present

## 2015-01-08 DIAGNOSIS — H8113 Benign paroxysmal vertigo, bilateral: Secondary | ICD-10-CM | POA: Diagnosis not present

## 2015-01-08 DIAGNOSIS — Z7982 Long term (current) use of aspirin: Secondary | ICD-10-CM | POA: Diagnosis not present

## 2015-01-08 DIAGNOSIS — R531 Weakness: Secondary | ICD-10-CM | POA: Diagnosis not present

## 2015-01-08 DIAGNOSIS — Z9181 History of falling: Secondary | ICD-10-CM | POA: Diagnosis not present

## 2015-01-13 DIAGNOSIS — H8113 Benign paroxysmal vertigo, bilateral: Secondary | ICD-10-CM | POA: Diagnosis not present

## 2015-01-13 DIAGNOSIS — Z7982 Long term (current) use of aspirin: Secondary | ICD-10-CM | POA: Diagnosis not present

## 2015-01-13 DIAGNOSIS — Z9181 History of falling: Secondary | ICD-10-CM | POA: Diagnosis not present

## 2015-01-13 DIAGNOSIS — R531 Weakness: Secondary | ICD-10-CM | POA: Diagnosis not present

## 2015-01-14 DIAGNOSIS — H8113 Benign paroxysmal vertigo, bilateral: Secondary | ICD-10-CM | POA: Diagnosis not present

## 2015-01-15 DIAGNOSIS — H8113 Benign paroxysmal vertigo, bilateral: Secondary | ICD-10-CM | POA: Diagnosis not present

## 2015-01-15 DIAGNOSIS — Z7982 Long term (current) use of aspirin: Secondary | ICD-10-CM | POA: Diagnosis not present

## 2015-01-15 DIAGNOSIS — R531 Weakness: Secondary | ICD-10-CM | POA: Diagnosis not present

## 2015-01-15 DIAGNOSIS — Z9181 History of falling: Secondary | ICD-10-CM | POA: Diagnosis not present

## 2015-01-20 DIAGNOSIS — H8113 Benign paroxysmal vertigo, bilateral: Secondary | ICD-10-CM | POA: Diagnosis not present

## 2015-01-20 DIAGNOSIS — Z7982 Long term (current) use of aspirin: Secondary | ICD-10-CM | POA: Diagnosis not present

## 2015-01-20 DIAGNOSIS — Z9181 History of falling: Secondary | ICD-10-CM | POA: Diagnosis not present

## 2015-01-20 DIAGNOSIS — R531 Weakness: Secondary | ICD-10-CM | POA: Diagnosis not present

## 2015-01-22 ENCOUNTER — Other Ambulatory Visit: Payer: Self-pay | Admitting: Internal Medicine

## 2015-01-22 DIAGNOSIS — R531 Weakness: Secondary | ICD-10-CM | POA: Diagnosis not present

## 2015-01-22 DIAGNOSIS — H8113 Benign paroxysmal vertigo, bilateral: Secondary | ICD-10-CM | POA: Diagnosis not present

## 2015-01-22 DIAGNOSIS — Z9181 History of falling: Secondary | ICD-10-CM | POA: Diagnosis not present

## 2015-01-22 DIAGNOSIS — Z7982 Long term (current) use of aspirin: Secondary | ICD-10-CM | POA: Diagnosis not present

## 2015-01-28 DIAGNOSIS — H8113 Benign paroxysmal vertigo, bilateral: Secondary | ICD-10-CM | POA: Diagnosis not present

## 2015-01-28 DIAGNOSIS — Z9181 History of falling: Secondary | ICD-10-CM | POA: Diagnosis not present

## 2015-01-28 DIAGNOSIS — Z7982 Long term (current) use of aspirin: Secondary | ICD-10-CM | POA: Diagnosis not present

## 2015-01-28 DIAGNOSIS — R531 Weakness: Secondary | ICD-10-CM | POA: Diagnosis not present

## 2015-01-29 DIAGNOSIS — Z7982 Long term (current) use of aspirin: Secondary | ICD-10-CM | POA: Diagnosis not present

## 2015-01-29 DIAGNOSIS — H8113 Benign paroxysmal vertigo, bilateral: Secondary | ICD-10-CM | POA: Diagnosis not present

## 2015-01-29 DIAGNOSIS — R531 Weakness: Secondary | ICD-10-CM | POA: Diagnosis not present

## 2015-01-29 DIAGNOSIS — Z9181 History of falling: Secondary | ICD-10-CM | POA: Diagnosis not present

## 2015-01-30 ENCOUNTER — Telehealth: Payer: Self-pay | Admitting: Internal Medicine

## 2015-01-30 NOTE — Telephone Encounter (Signed)
Called spoke with Sabrina Page. Made her aware [facility] has no sooner appts. She will keep pending appt scheduled. Nothing further needed

## 2015-01-31 ENCOUNTER — Ambulatory Visit (INDEPENDENT_AMBULATORY_CARE_PROVIDER_SITE_OTHER): Payer: Medicare Other | Admitting: Internal Medicine

## 2015-01-31 ENCOUNTER — Encounter: Payer: Self-pay | Admitting: Internal Medicine

## 2015-01-31 VITALS — BP 100/62 | HR 86 | Wt 231.0 lb

## 2015-01-31 DIAGNOSIS — R296 Repeated falls: Secondary | ICD-10-CM

## 2015-01-31 DIAGNOSIS — M79605 Pain in left leg: Secondary | ICD-10-CM

## 2015-01-31 DIAGNOSIS — I10 Essential (primary) hypertension: Secondary | ICD-10-CM

## 2015-01-31 DIAGNOSIS — J4520 Mild intermittent asthma, uncomplicated: Secondary | ICD-10-CM

## 2015-01-31 DIAGNOSIS — M545 Low back pain: Secondary | ICD-10-CM

## 2015-01-31 DIAGNOSIS — M79604 Pain in right leg: Secondary | ICD-10-CM

## 2015-01-31 DIAGNOSIS — Z23 Encounter for immunization: Secondary | ICD-10-CM

## 2015-01-31 DIAGNOSIS — W19XXXA Unspecified fall, initial encounter: Secondary | ICD-10-CM

## 2015-01-31 DIAGNOSIS — Y92009 Unspecified place in unspecified non-institutional (private) residence as the place of occurrence of the external cause: Secondary | ICD-10-CM

## 2015-01-31 DIAGNOSIS — Y92099 Unspecified place in other non-institutional residence as the place of occurrence of the external cause: Secondary | ICD-10-CM

## 2015-01-31 NOTE — Assessment & Plan Note (Signed)
NAS, wt loss Off Rx

## 2015-01-31 NOTE — Assessment & Plan Note (Signed)
Doing better - slow progress

## 2015-01-31 NOTE — Assessment & Plan Note (Addendum)
On Breo Correct Breo use discussed - use while sitting

## 2015-01-31 NOTE — Progress Notes (Signed)
Pre visit review using our clinic review tool, if applicable. No additional management support is needed unless otherwise documented below in the visit note. 

## 2015-01-31 NOTE — Assessment & Plan Note (Signed)
Cont w/wt loss 

## 2015-01-31 NOTE — Progress Notes (Signed)
Subjective:  Patient ID: Sabrina Page, female    DOB: [birth year]  Age: 81 y.o. MRN: [id number]  CC: No chief complaint on file.   HPI Sabrina Page presents for low BP, asthma, LBP. Pt fell once after she used Breo while standing. G/o tailbone pain  Outpatient Prescriptions Prior to Visit  Medication Sig Dispense Refill  . aspirin 81 MG tablet Take 81 mg by mouth every morning.     . B Complex-C (SUPER B COMPLEX PO) Take 1 tablet by mouth every morning.     . Cholecalciferol 1000 UNITS capsule Take 1,000 Units by mouth every morning.     . clonazePAM (KLONOPIN) 0.5 MG tablet Take 1 tablet (0.5 mg total) by mouth 2 (two) times daily as needed for anxiety (anxiety, insomnia). 60 tablet 2  . FLUoxetine (PROZAC) 10 MG tablet TAKE 2 TABLETS BY MOUTH DAILY 60 tablet 5  . Fluticasone Furoate-Vilanterol (BREO ELLIPTA) 200-25 MCG/INH AEPB Inhale 1 puff into the lungs daily. 30 each 11  . KLOR-CON 8 MEQ tablet TAKE 1 TABLET EVERY MORNING 30 tablet 5  . loratadine (CLARITIN) 10 MG tablet Take 1 tablet (10 mg total) by mouth every morning. 100 tablet 3  . Omega-3 Fatty Acids (FISH OIL) 1000 MG CAPS Take 1 capsule by mouth every morning.     Marland Kitchen omeprazole (PRILOSEC) 20 MG capsule Take 2 capsules (40 mg total) by mouth daily. 180 capsule 3  . potassium chloride (KLOR-CON 10) 10 MEQ tablet Take 1 tablet (10 mEq total) by mouth daily. 30 tablet 11  . traMADol (ULTRAM) 50 MG tablet Take 1 tablet (50 mg total) by mouth every 6 (six) hours as needed. TAKE 1 TABLET BY MOUTH EVERY 8 HOURS AS NEEDED FOR PAIN 120 tablet 2  . indapamide (LOZOL) 1.25 MG tablet TAKE 1 TABLET BY MOUTH EVERY MORNING FOR SWELLING 30 tablet 5  . topiramate (TOPAMAX) 25 MG tablet TAKE 1 TABLET BY MOUTH TWICE A DAY 60 tablet 11   No facility-administered medications prior to visit.    ROS Review of Systems  Constitutional: Negative for chills, activity change, appetite change, fatigue and unexpected weight change.    HENT: Negative for congestion, mouth sores and sinus pressure.   Eyes: Negative for visual disturbance.  Respiratory: Positive for wheezing. Negative for cough and chest tightness.   Gastrointestinal: Negative for nausea and abdominal pain.  Genitourinary: Negative for frequency, difficulty urinating and vaginal pain.  Musculoskeletal: Positive for back pain, arthralgias and gait problem.  Skin: Negative for pallor and rash.  Neurological: Negative for dizziness, tremors, weakness, numbness and headaches.  Psychiatric/Behavioral: Positive for decreased concentration. Negative for suicidal ideas, confusion and sleep disturbance. The patient is nervous/anxious.     Objective:  BP 100/62 mmHg  Pulse 86  Wt 231 lb (104.781 kg)  SpO2 97%  BP Readings from Last 3 Encounters:  01/31/15 100/62  12/20/14 110/70  09/13/14 128/82    Wt Readings from Last 3 Encounters:  01/31/15 231 lb (104.781 kg)  12/20/14 235 lb (106.595 kg)  09/13/14 238 lb (107.956 kg)    Physical Exam  Constitutional: She appears well-developed. No distress.  HENT:  Head: Normocephalic.  Right Ear: External ear normal.  Left Ear: External ear normal.  Nose: Nose normal.  Mouth/Throat: Oropharynx is clear and moist.  Eyes: Conjunctivae are normal. Pupils are equal, round, and reactive to light. Right eye exhibits no discharge. Left eye exhibits no discharge.  Neck: Normal range of motion. Neck supple. No JVD  present. No tracheal deviation present. No thyromegaly present.  Cardiovascular: Normal rate, regular rhythm and normal heart sounds.   Pulmonary/Chest: No stridor. No respiratory distress. She has no wheezes.  Abdominal: Soft. Bowel sounds are normal. She exhibits no distension and no mass. There is no tenderness. There is no rebound and no guarding.  Musculoskeletal: She exhibits no edema or tenderness.  Lymphadenopathy:    She has no cervical adenopathy.  Neurological: She displays normal reflexes. No  cranial nerve deficit. She exhibits normal muscle tone. Coordination normal.  Skin: No rash noted. No erythema.  Psychiatric: She has a normal mood and affect. Her behavior is normal. Thought content normal.  LS, coccyx - minimally tender; no bruises  Lab Results  Component Value Date   WBC 6.0 06/12/2014   HGB 15.3* 06/12/2014   HCT 45.1 06/12/2014   PLT 186.0 06/12/2014   GLUCOSE 83 09/13/2014   CHOL 270* 06/12/2014   TRIG 132.0 06/12/2014   HDL 52.40 06/12/2014   LDLDIRECT 159.1 05/16/2012   LDLCALC 191* 06/12/2014   ALT 19 06/12/2014   AST 20 06/12/2014   NA 138 09/13/2014   K 4.3 09/13/2014   CL 102 09/13/2014   CREATININE 0.88 09/13/2014   BUN 14 09/13/2014   CO2 33* 09/13/2014   TSH 1.78 09/13/2014   INR 1.07 09/06/2011   HGBA1C 5.8 09/13/2014    Mm Digital Screening Bilateral  04/12/2014   CLINICAL DATA:  Screening.  EXAM: DIGITAL SCREENING BILATERAL MAMMOGRAM WITH CAD  COMPARISON:  04/05/2013, 04/03/2012 and 03/29/2011  ACR Breast Density Category b: There are scattered areas of fibroglandular density.  FINDINGS: There are no findings suspicious for malignancy. Images were processed with CAD.  IMPRESSION: No mammographic evidence of malignancy. A result letter of this screening mammogram will be mailed directly to the patient.  RECOMMENDATION: Screening mammogram in one year. (Code:[id number]-[id number])  BI-RADS CATEGORY  1: Negative.   Electronically Signed   By: Sabrina Page   On: 04/12/2014 16:04    Assessment & Plan:   Diagnoses and all orders for this visit:  Essential hypertension  Asthma, chronic, mild intermittent, uncomplicated  LBP radiating to both legs  Morbid obesity  Falls frequently  Need for influenza vaccination -     Flu Vaccine QUAD 36+ mos IM   I have discontinued Sabrina Page's indapamide and topiramate. I am also having her maintain her aspirin, Fish Oil, Cholecalciferol, B Complex-C (SUPER B COMPLEX PO), potassium chloride, loratadine,  FLUoxetine, KLOR-CON, traMADol, clonazePAM, Fluticasone Furoate-Vilanterol, and omeprazole.  No orders of the defined types were placed in this encounter.     Follow-up: Return in about 6 weeks (around 03/14/2015) for a follow-up visit.  Sabrina Kehr, MD

## 2015-01-31 NOTE — Assessment & Plan Note (Signed)
S/p recent fall Contusion

## 2015-02-01 ENCOUNTER — Encounter: Payer: Self-pay | Admitting: Internal Medicine

## 2015-02-01 DIAGNOSIS — W19XXXA Unspecified fall, initial encounter: Secondary | ICD-10-CM | POA: Insufficient documentation

## 2015-02-01 DIAGNOSIS — Y92009 Unspecified place in unspecified non-institutional (private) residence as the place of occurrence of the external cause: Secondary | ICD-10-CM | POA: Insufficient documentation

## 2015-02-03 DIAGNOSIS — Z7982 Long term (current) use of aspirin: Secondary | ICD-10-CM | POA: Diagnosis not present

## 2015-02-03 DIAGNOSIS — R531 Weakness: Secondary | ICD-10-CM | POA: Diagnosis not present

## 2015-02-03 DIAGNOSIS — Z9181 History of falling: Secondary | ICD-10-CM | POA: Diagnosis not present

## 2015-02-03 DIAGNOSIS — H8113 Benign paroxysmal vertigo, bilateral: Secondary | ICD-10-CM | POA: Diagnosis not present

## 2015-02-05 DIAGNOSIS — Z9181 History of falling: Secondary | ICD-10-CM | POA: Diagnosis not present

## 2015-02-05 DIAGNOSIS — Z7982 Long term (current) use of aspirin: Secondary | ICD-10-CM | POA: Diagnosis not present

## 2015-02-05 DIAGNOSIS — H8113 Benign paroxysmal vertigo, bilateral: Secondary | ICD-10-CM | POA: Diagnosis not present

## 2015-02-05 DIAGNOSIS — R531 Weakness: Secondary | ICD-10-CM | POA: Diagnosis not present

## 2015-02-13 ENCOUNTER — Ambulatory Visit (INDEPENDENT_AMBULATORY_CARE_PROVIDER_SITE_OTHER): Payer: Medicare Other | Admitting: Internal Medicine

## 2015-02-13 ENCOUNTER — Encounter: Payer: Self-pay | Admitting: Internal Medicine

## 2015-02-13 VITALS — BP 130/76 | HR 85 | Ht 64.0 in | Wt 230.8 lb

## 2015-02-13 DIAGNOSIS — J4520 Mild intermittent asthma, uncomplicated: Secondary | ICD-10-CM

## 2015-02-13 DIAGNOSIS — Z87891 Personal history of nicotine dependence: Secondary | ICD-10-CM

## 2015-02-13 DIAGNOSIS — G4733 Obstructive sleep apnea (adult) (pediatric): Secondary | ICD-10-CM

## 2015-02-13 NOTE — Assessment & Plan Note (Signed)
She has a history of smoking and asthma but chest exam is clear today and office spirometry looks normal. Plan-continue present meds

## 2015-02-13 NOTE — Progress Notes (Signed)
Subjective:    Patient ID: Sabrina Page, female    DOB: Oct 21, 1933, 81 y.o.   MRN: [id number]  HPI 07/05/14 Dr Gwenette Greet The patient comes in today for follow-up of her obstructive sleep apnea. She is gradually getting more accustomed to her device, and her downloaded shows 4 hours of usage a night. She has excellent control of her AHI, and no significant mask leak. She tells me that she continues to have sleep disruption because of the pull of the hose on her mask.  She wishes to try something different that may be easier.// Dr C suggested nasal pillows and weight loss.  02/13/15- 86 yoF  Followed for OSA, chronic insomnia, complicated by asthma, HBP, depression,  Unattended Home Sleep Test 07/31/23  AHI 19/ hr, desat to 78%,  Weight 226 lbs Former [place] pt; has not been wearing CPAP/[facility]-husband disconnected it-has worn in past few days since daughter connected again. DME-[facility] sent her a letter stating no coverage due to not being compliant. Has felt better since using now-had fallen 5 times previously, was not sleeping well, breathing better.  Daughter here helps with [place] translation from [place] They report low oxygen saturation when she first gets up in the mornings. She has not been wearing CPAP reliably. Has had some falls. Former smoker with long history of asthma as a child and young woman. They ask what her lung function is like. Office Spirometry 02/13/2015-WNL  Review of Systems  Constitutional: Negative for fever and unexpected weight change.  HENT: Negative for congestion, dental problem, ear pain, nosebleeds, postnasal drip, rhinorrhea, sinus pressure, sneezing, sore throat and trouble swallowing.   Eyes: Negative for redness and itching.  Respiratory: Negative for cough, chest tightness, + shortness of breath and wheezing.   Cardiovascular: Negative for palpitations and leg swelling.  Gastrointestinal: Negative for nausea and vomiting.  Genitourinary: Negative for  dysuria.  Musculoskeletal: Negative for joint swelling.  Skin: Negative for rash.  Neurological: Negative for headaches.  Hematological: Does not bruise/bleed easily.  Psychiatric/Behavioral: Negative for dysphoric mood. The patient is not nervous/anxious.      Objective:  OBJ- Physical Exam + limited English/daughter translated General- Alert, Oriented, Affect-appropriate, Distress- none acute, + overweight Skin- rash-none, lesions- none, excoriation- none Lymphadenopathy- none Head- atraumatic            Eyes- Gross vision intact, PERRLA, conjunctivae and secretions clear            Ears- Hearing, canals-normal            Nose- Clear, no-Septal dev, mucus, polyps, erosion, perforation             Throat- Mallampati III , mucosa clear , drainage- none, tonsils- atrophic, + full dentures Neck- flexible , trachea midline, no stridor , thyroid nl, carotid no bruit Chest - symmetrical excursion , unlabored           Heart/CV- RRR , no murmur , no gallop  , no rub, nl s1 s2                           - JVD- none , edema- none, stasis changes- none, varices- none           Lung- clear to P&A, wheeze- none, cough- none , dullness-none, rub- none           Chest wall-  Abd-  Br/ Gen/ Rectal- Not done, not indicated Extrem- cyanosis- none, clubbing, none, atrophy- none, strength-  nl Neuro- grossly intact to observation  Assessment & Plan:

## 2015-02-13 NOTE — Assessment & Plan Note (Signed)
Not using CPAP adequately. She has dentures and will be a good candidate for an oral appliance so I explained we need to try to make CPAP work if we can. Weight loss would also help. Plan-[facility] is to work with her, re-instructing use of CPAP to overcome her anxiety with daughter present to translate, set auto range 5-15

## 2015-02-13 NOTE — Patient Instructions (Signed)
Order- office spirometry     Dx history of tobacco use  Order- DME [facility]- continue CPAP, set auto 5-15, mask of choice, humidifier, supplies, AirView     Dx OSA . Patient will need daughter available as translator- Please re-instruct in CPAP use and help with comfort settings for better  compliance

## 2015-02-20 NOTE — Progress Notes (Signed)
Quick Note:  Called and spoke with pt. She states she does not speak good english and will have her daughter return our call. ______

## 2015-03-02 ENCOUNTER — Other Ambulatory Visit: Payer: Self-pay | Admitting: Internal Medicine

## 2015-03-12 ENCOUNTER — Other Ambulatory Visit: Payer: Self-pay

## 2015-03-12 DIAGNOSIS — Z1231 Encounter for screening mammogram for malignant neoplasm of breast: Secondary | ICD-10-CM

## 2015-03-25 ENCOUNTER — Encounter: Payer: Self-pay | Admitting: Internal Medicine

## 2015-03-25 ENCOUNTER — Ambulatory Visit (INDEPENDENT_AMBULATORY_CARE_PROVIDER_SITE_OTHER): Payer: Medicare Other | Admitting: Internal Medicine

## 2015-03-25 ENCOUNTER — Other Ambulatory Visit: Payer: Self-pay | Admitting: Internal Medicine

## 2015-03-25 DIAGNOSIS — F411 Generalized anxiety disorder: Secondary | ICD-10-CM | POA: Diagnosis not present

## 2015-03-25 DIAGNOSIS — I10 Essential (primary) hypertension: Secondary | ICD-10-CM

## 2015-03-25 DIAGNOSIS — M545 Low back pain: Secondary | ICD-10-CM | POA: Diagnosis not present

## 2015-03-25 MED ORDER — PHENTERMINE HCL 37.5 MG PO TABS: 37.5000 mg | ORAL_TABLET | Freq: Every day | ORAL | Status: DC

## 2015-03-25 MED ORDER — FLUOXETINE HCL 20 MG PO TABS: 20.0000 mg | ORAL_TABLET | Freq: Every day | ORAL | Status: DC

## 2015-03-25 NOTE — Progress Notes (Signed)
Subjective:  Patient ID: Sabrina Page, female    DOB: [birth year]  Age: 81 y.o. MRN: [id number]  CC: No chief complaint on file.   HPI Sabrina Page presents for anxiety, depression, wt gain, asthma f/u  Outpatient Prescriptions Prior to Visit  Medication Sig Dispense Refill  . aspirin 81 MG tablet Take 81 mg by mouth every morning.     . B Complex-C (SUPER B COMPLEX PO) Take 1 tablet by mouth every morning.     . Cholecalciferol 1000 UNITS capsule Take 1,000 Units by mouth every morning.     . clonazePAM (KLONOPIN) 0.5 MG tablet Take 1 tablet (0.5 mg total) by mouth 2 (two) times daily as needed for anxiety (anxiety, insomnia). 60 tablet 2  . Fluticasone Furoate-Vilanterol (BREO ELLIPTA) 200-25 MCG/INH AEPB Inhale 1 puff into the lungs daily. 30 each 11  . indapamide (LOZOL) 1.25 MG tablet TAKE 1 TABLET BY MOUTH EVERY MORNING FOR SWELLING 30 tablet 5  . KLOR-CON 8 MEQ tablet TAKE 1 TABLET EVERY MORNING 30 tablet 5  . loratadine (CLARITIN) 10 MG tablet Take 1 tablet (10 mg total) by mouth every morning. 100 tablet 3  . Omega-3 Fatty Acids (FISH OIL) 1000 MG CAPS Take 1 capsule by mouth every morning.     Marland Kitchen omeprazole (PRILOSEC) 20 MG capsule Take 2 capsules (40 mg total) by mouth daily. 180 capsule 3  . potassium chloride (KLOR-CON 10) 10 MEQ tablet Take 1 tablet (10 mEq total) by mouth daily. 30 tablet 11  . traMADol (ULTRAM) 50 MG tablet Take 1 tablet (50 mg total) by mouth every 6 (six) hours as needed. TAKE 1 TABLET BY MOUTH EVERY 8 HOURS AS NEEDED FOR PAIN 120 tablet 2  . FLUoxetine (PROZAC) 10 MG tablet TAKE 2 TABLETS BY MOUTH DAILY 60 tablet 5  . indapamide (LOZOL) 1.25 MG tablet Take 1 tablet by mouth daily.    Marland Kitchen topiramate (TOPAMAX) 25 MG tablet Take 1 tablet by mouth 2 (two) times daily.     No facility-administered medications prior to visit.    ROS Review of Systems  Constitutional: Negative for chills, activity change, appetite change, fatigue and  unexpected weight change.  HENT: Negative for congestion, mouth sores, rhinorrhea, sinus pressure and voice change.   Eyes: Negative for visual disturbance.  Respiratory: Negative for cough and chest tightness.   Gastrointestinal: Negative for nausea and abdominal pain.  Genitourinary: Negative for frequency, difficulty urinating and vaginal pain.  Musculoskeletal: Negative for back pain and gait problem.  Skin: Negative for pallor and rash.  Neurological: Positive for light-headedness. Negative for dizziness, tremors, weakness, numbness and headaches.  Psychiatric/Behavioral: Positive for dysphoric mood and decreased concentration. Negative for confusion and sleep disturbance. The patient is nervous/anxious.     Objective:  BP 118/64 mmHg  Pulse 78  Wt 233 lb (105.688 kg)  SpO2 92%  BP Readings from Last 3 Encounters:  03/25/15 118/64  02/13/15 130/76  01/31/15 100/62    Wt Readings from Last 3 Encounters:  03/25/15 233 lb (105.688 kg)  02/13/15 230 lb 12.8 oz (104.69 kg)  01/31/15 231 lb (104.781 kg)    Physical Exam  Constitutional: She appears well-developed. No distress.  HENT:  Head: Normocephalic.  Right Ear: External ear normal.  Left Ear: External ear normal.  Nose: Nose normal.  Mouth/Throat: Oropharynx is clear and moist.  Eyes: Conjunctivae are normal. Pupils are equal, round, and reactive to light. Right eye exhibits no discharge. Left eye exhibits no discharge.  Neck: Normal range of motion. Neck supple. No JVD present. No tracheal deviation present. No thyromegaly present.  Cardiovascular: Normal rate, regular rhythm and normal heart sounds.   Pulmonary/Chest: No stridor. No respiratory distress. She has no wheezes.  Abdominal: Soft. Bowel sounds are normal. She exhibits no distension and no mass. There is no tenderness. There is no rebound and no guarding.  Musculoskeletal: She exhibits tenderness. She exhibits no edema.  Lymphadenopathy:    She has no  cervical adenopathy.  Neurological: She displays normal reflexes. No cranial nerve deficit. She exhibits normal muscle tone. Coordination normal.  Skin: No rash noted. No erythema.  Psychiatric: She has a normal mood and affect. Her behavior is normal. Judgment and thought content normal.  LS is tender Obese  Lab Results  Component Value Date   WBC 6.0 06/12/2014   HGB 15.3* 06/12/2014   HCT 45.1 06/12/2014   PLT 186.0 06/12/2014   GLUCOSE 83 09/13/2014   CHOL 270* 06/12/2014   TRIG 132.0 06/12/2014   HDL 52.40 06/12/2014   LDLDIRECT 159.1 05/16/2012   LDLCALC 191* 06/12/2014   ALT 19 06/12/2014   AST 20 06/12/2014   NA 138 09/13/2014   K 4.3 09/13/2014   CL 102 09/13/2014   CREATININE 0.88 09/13/2014   BUN 14 09/13/2014   CO2 33* 09/13/2014   TSH 1.78 09/13/2014   INR 1.07 09/06/2011   HGBA1C 5.8 09/13/2014    Mm Digital Screening Bilateral  04/12/2014  CLINICAL DATA:  Screening. EXAM: DIGITAL SCREENING BILATERAL MAMMOGRAM WITH CAD COMPARISON:  04/05/2013, 04/03/2012 and 03/29/2011 ACR Breast Density Category b: There are scattered areas of fibroglandular density. FINDINGS: There are no findings suspicious for malignancy. Images were processed with CAD. IMPRESSION: No mammographic evidence of malignancy. A result letter of this screening mammogram will be mailed directly to the patient. RECOMMENDATION: Screening mammogram in one year. (Code:SM-B-01Y) BI-RADS CATEGORY  1: Negative. Electronically Signed   By: Rosemarie Ax   On: 04/12/2014 16:04    Assessment & Plan:   Diagnoses and all orders for this visit:  Morbid obesity, unspecified obesity type ([place])  Anxiety state  Essential hypertension  Other orders -     FLUoxetine (PROZAC) 20 MG tablet; Take 1 tablet (20 mg total) by mouth daily. -     phentermine (ADIPEX-P) 37.5 MG tablet; Take 1 tablet (37.5 mg total) by mouth daily before breakfast.  I have discontinued Ms. Crean's topiramate and FLUoxetine.  I am also having her start on FLUoxetine and phentermine. Additionally, I am having her maintain her aspirin, Fish Oil, Cholecalciferol, B Complex-C (SUPER B COMPLEX PO), potassium chloride, loratadine, traMADol, clonazePAM, Fluticasone Furoate-Vilanterol, omeprazole, indapamide, and KLOR-CON.  Meds ordered this encounter  Medications  . FLUoxetine (PROZAC) 20 MG tablet    Sig: Take 1 tablet (20 mg total) by mouth daily.    Dispense:  30 tablet    Refill:  11  . phentermine (ADIPEX-P) 37.5 MG tablet    Sig: Take 1 tablet (37.5 mg total) by mouth daily before breakfast.    Dispense:  30 tablet    Refill:  2     Follow-up: Return in about 2 months (around 05/25/2015) for a follow-up visit.  [name], MD

## 2015-03-25 NOTE — Assessment & Plan Note (Signed)
Indapamide 

## 2015-03-25 NOTE — Progress Notes (Signed)
Pre visit review using our clinic review tool, if applicable. No additional management support is needed unless otherwise documented below in the visit note. 

## 2015-03-25 NOTE — Assessment & Plan Note (Signed)
Chronic; psychosomatic issues On Fluoxetine  Potential benefits of a long term benzodiazepines  use as well as potential risks  and complications were explained to the patient and were aknowledged.

## 2015-03-25 NOTE — Assessment & Plan Note (Addendum)
Refractory Intolerant of Topamax Phentermine to try 10/16 We can try Alli for weight loss  Potential benefits of a long/short term phentermine use as well as potential risks  and complications were explained to the patient and were aknowledged.

## 2015-03-28 ENCOUNTER — Other Ambulatory Visit: Payer: Self-pay | Admitting: Internal Medicine

## 2015-03-28 NOTE — Telephone Encounter (Signed)
Please advise, thanks.

## 2015-04-02 NOTE — Telephone Encounter (Signed)
Called in to [facility]

## 2015-04-07 DIAGNOSIS — M545 Low back pain: Secondary | ICD-10-CM | POA: Diagnosis not present

## 2015-04-10 DIAGNOSIS — M5442 Lumbago with sciatica, left side: Secondary | ICD-10-CM | POA: Diagnosis not present

## 2015-04-14 ENCOUNTER — Ambulatory Visit
Admission: RE | Admit: 2015-04-14 | Discharge: 2015-04-14 | Disposition: A | Discharge status: Home / Self Care | Payer: Medicare Other | Source: Ambulatory Visit

## 2015-04-14 DIAGNOSIS — Z1231 Encounter for screening mammogram for malignant neoplasm of breast: Secondary | ICD-10-CM

## 2015-06-08 ENCOUNTER — Other Ambulatory Visit: Payer: Self-pay | Admitting: Internal Medicine

## 2015-06-09 NOTE — Telephone Encounter (Signed)
Please advise, thanks.

## 2015-06-13 NOTE — Telephone Encounter (Signed)
Clonazepam rx called in to [facility]

## 2015-06-20 ENCOUNTER — Ambulatory Visit (INDEPENDENT_AMBULATORY_CARE_PROVIDER_SITE_OTHER): Payer: Medicare Other | Admitting: Internal Medicine

## 2015-06-20 ENCOUNTER — Encounter: Payer: Self-pay | Admitting: Internal Medicine

## 2015-06-20 VITALS — BP 124/60 | HR 89 | Ht 64.0 in | Wt 230.0 lb

## 2015-06-20 DIAGNOSIS — J4520 Mild intermittent asthma, uncomplicated: Secondary | ICD-10-CM | POA: Diagnosis not present

## 2015-06-20 DIAGNOSIS — G4733 Obstructive sleep apnea (adult) (pediatric): Secondary | ICD-10-CM

## 2015-06-20 NOTE — Assessment & Plan Note (Signed)
There is some anxiety and frustration magnified by language barrier. I asked her daughter to reach out to [facility] to help establish a communication channel so that they can help with mask fit and other comfort issues were needed. We discussed CPAP compliance goals

## 2015-06-20 NOTE — Assessment & Plan Note (Signed)
She has a Breo inhaler but she has had normal office spirometry and normal myocardial perfusion study. I don't have documentation that she has had recent bronchospasm at all. There is a significant overlay of anxiety and depression with some obesity and deconditioning. We discussed chest x-ray but really had no reason to order one today.

## 2015-06-20 NOTE — Patient Instructions (Addendum)
We hope to be able to wear CPAP whenever you are sleeping, all night, every night.   At least we would like you to try to wear it for at least 4 hours per night, at least 70% of nights.  I doubt that the little dog will be much of an allergy problem. If you are having more trouble with your breathing we will try to help.  It is ok to use the blue Breo inhaler every day- Inhale 1 puff then rinse mouth, one time every day.  Your daughter speaks [place]. She can call [facility] to make sure they know to work through her so she can translate for you.

## 2015-06-20 NOTE — Progress Notes (Signed)
Subjective:    Patient ID: Sabrina Page, female    DOB: 1933-07-31, 82 y.o.   MRN: [id number]  HPI 07/05/14 Dr Gwenette Greet The patient comes in today for follow-up of her obstructive sleep apnea. She is gradually getting more accustomed to her device, and her downloaded shows 4 hours of usage a night. She has excellent control of her AHI, and no significant mask leak. She tells me that she continues to have sleep disruption because of the pull of the hose on her mask.  She wishes to try something different that may be easier.// Dr C suggested nasal pillows and weight loss.  02/13/15- 73 yoF  Followed for OSA, chronic insomnia, complicated by asthma, HBP, depression,  Unattended Home Sleep Test 07/30/13  AHI 19/ hr, desat to 78%,  Weight 226 lbs Former [place] pt; has not been wearing CPAP/[facility]-husband disconnected it-has worn in past few days since daughter connected again. DME-[facility] sent her a letter stating no coverage due to not being compliant. Has felt better since using now-had fallen 5 times previously, was not sleeping well, breathing better.  Daughter here helps with [place] translation from [place] They report low oxygen saturation when she first gets up in the mornings. She has not been wearing CPAP reliably. Has had some falls. Former smoker with long history of asthma as a child and young woman. They ask what her lung function is like. Office Spirometry 02/13/2015-WNL  06/20/2015-82 year old female former smoker followed for OSA, chronic insomnia, complicated by asthma, HBP, depression CPAP auto 5-15/ [facility] Follows for- pt states her mask is uncomfortable- falling off and sliding on face.  Also c/o shortness of breath.   Myocardial Perfusion study 08/03/13- no ischemia She comes today with translator and with her daughter whose English is pretty good. Because the patient speaks little [place], she tends not to answer the phone and has not been in contact with [facility]. We reviewed  download demonstrating good control but inadequate use. Mostly she gets frustrated and doesn't know how to make comfort adjustments. She was out of sorts yesterday, nonspecific description of multiple aches and pains with some shortness of breath when she bends over. They got a little dog because her husband was depressed and unhappy. She feels it may be too much trouble to take care of. It is not causing any direct breathing problems.   Review of Systems  Constitutional: Negative for fever and unexpected weight change.  HENT: Negative for congestion, dental problem, ear pain, nosebleeds, postnasal drip, rhinorrhea, sinus pressure, sneezing, sore throat and trouble swallowing.   Eyes: Negative for redness and itching.  Respiratory: Negative for cough, chest tightness, + shortness of breath and wheezing.   Cardiovascular: Negative for palpitations and leg swelling.  Gastrointestinal: Negative for nausea and vomiting.  Genitourinary: Negative for dysuria.  Musculoskeletal: Negative for joint swelling.  Skin: Negative for rash.   Neurological: Negative for headaches.  Hematological: Does not bruise/bleed easily.  Psychiatric/Behavioral: Negative for dysphoric mood. The patient is not nervous/anxious.      Objective:  OBJ- Physical Exam + limited English/daughter translated General- Alert, Oriented, Affect-appropriate, Distress- none acute, + overweight Skin- rash-none, lesions- none, excoriation- none Lymphadenopathy- none Head- atraumatic            Eyes- Gross vision intact, PERRLA, conjunctivae and secretions clear            Ears- Hearing, canals-normal            Nose- Clear, no-Septal dev, mucus, polyps, erosion, perforation  Throat- Mallampati III , mucosa clear , drainage- none, tonsils- atrophic, + full dentures Neck- flexible , trachea midline, no stridor , thyroid nl, carotid no bruit Chest - symmetrical excursion , unlabored           Heart/CV- RRR , no murmur , no  gallop  , no rub, nl s1 s2                           - JVD- none , edema- none, stasis changes- none, varices- none           Lung- clear to P&A, wheeze- none, cough- none , dullness-none, rub- none           Chest wall-  Abd-  Br/ Gen/ Rectal- Not done, not indicated Extrem- cyanosis- none, clubbing, none, atrophy- none, strength- nl Neuro- grossly intact to observation  Assessment & Plan:

## 2015-06-25 ENCOUNTER — Ambulatory Visit: Payer: Self-pay | Admitting: Internal Medicine

## 2015-06-26 ENCOUNTER — Encounter: Payer: Self-pay | Admitting: Internal Medicine

## 2015-06-26 ENCOUNTER — Ambulatory Visit (INDEPENDENT_AMBULATORY_CARE_PROVIDER_SITE_OTHER): Payer: Medicare Other | Admitting: Internal Medicine

## 2015-06-26 ENCOUNTER — Other Ambulatory Visit (INDEPENDENT_AMBULATORY_CARE_PROVIDER_SITE_OTHER): Payer: Medicare Other

## 2015-06-26 VITALS — BP 120/80 | HR 83 | Wt 230.0 lb

## 2015-06-26 DIAGNOSIS — M79604 Pain in right leg: Secondary | ICD-10-CM

## 2015-06-26 DIAGNOSIS — M79605 Pain in left leg: Secondary | ICD-10-CM

## 2015-06-26 DIAGNOSIS — R0989 Other specified symptoms and signs involving the circulatory and respiratory systems: Secondary | ICD-10-CM

## 2015-06-26 DIAGNOSIS — I10 Essential (primary) hypertension: Secondary | ICD-10-CM

## 2015-06-26 DIAGNOSIS — M545 Low back pain: Secondary | ICD-10-CM

## 2015-06-26 DIAGNOSIS — R7309 Other abnormal glucose: Secondary | ICD-10-CM

## 2015-06-26 DIAGNOSIS — G4733 Obstructive sleep apnea (adult) (pediatric): Secondary | ICD-10-CM | POA: Diagnosis not present

## 2015-06-26 DIAGNOSIS — J4520 Mild intermittent asthma, uncomplicated: Secondary | ICD-10-CM | POA: Diagnosis not present

## 2015-06-26 LAB — CBC WITH DIFFERENTIAL/PLATELET
Basophils Absolute: 0 10*3/uL (ref 0.0–0.1)
Basophils Relative: 0.8 % (ref 0.0–3.0)
Eosinophils Absolute: 0.2 10*3/uL (ref 0.0–0.7)
Eosinophils Relative: 3 % (ref 0.0–5.0)
HCT: 46 % (ref 36.0–46.0)
Hemoglobin: 14.9 g/dL (ref 12.0–15.0)
Lymphocytes Relative: 31.8 % (ref 12.0–46.0)
Lymphs Abs: 1.7 10*3/uL (ref 0.7–4.0)
MCHC: 32.5 g/dL (ref 30.0–36.0)
MCV: 88 fl (ref 78.0–100.0)
Monocytes Absolute: 0.4 10*3/uL (ref 0.1–1.0)
Monocytes Relative: 7.6 % (ref 3.0–12.0)
Neutro Abs: 3.1 10*3/uL (ref 1.4–7.7)
Neutrophils Relative %: 56.8 % (ref 43.0–77.0)
Platelets: 195 10*3/uL (ref 150.0–400.0)
RBC: 5.22 Mil/uL — ABNORMAL HIGH (ref 3.87–5.11)
RDW: 13.8 % (ref 11.5–15.5)
WBC: 5.4 10*3/uL (ref 4.0–10.5)

## 2015-06-26 LAB — URINALYSIS
Bilirubin Urine: NEGATIVE
Hgb urine dipstick: NEGATIVE
Ketones, ur: NEGATIVE
Leukocytes, UA: NEGATIVE
Nitrite: NEGATIVE
Specific Gravity, Urine: 1.02 (ref 1.000–1.030)
Total Protein, Urine: NEGATIVE
Urine Glucose: NEGATIVE
Urobilinogen, UA: 0.2 (ref 0.0–1.0)
pH: 5.5 (ref 5.0–8.0)

## 2015-06-26 LAB — HEPATIC FUNCTION PANEL
ALT: 17 U/L (ref 0–35)
AST: 18 U/L (ref 0–37)
Albumin: 3.8 g/dL (ref 3.5–5.2)
Alkaline Phosphatase: 60 U/L (ref 39–117)
Bilirubin, Direct: 0.1 mg/dL (ref 0.0–0.3)
Total Bilirubin: 0.6 mg/dL (ref 0.2–1.2)
Total Protein: 7.1 g/dL (ref 6.0–8.3)

## 2015-06-26 LAB — BASIC METABOLIC PANEL
BUN: 13 mg/dL (ref 6–23)
CO2: 29 mEq/L (ref 19–32)
Calcium: 9.2 mg/dL (ref 8.4–10.5)
Chloride: 103 mEq/L (ref 96–112)
Creatinine, Ser: 0.96 mg/dL (ref 0.40–1.20)
GFR: 59.48 mL/min — ABNORMAL LOW (ref >60.00)
Glucose, Bld: 105 mg/dL — ABNORMAL HIGH (ref 70–99)
Potassium: 4.5 mEq/L (ref 3.5–5.1)
Sodium: 139 mEq/L (ref 135–145)

## 2015-06-26 LAB — TSH: TSH: 1.75 u[IU]/mL (ref 0.35–4.50)

## 2015-06-26 MED ORDER — LORATADINE 10 MG PO TABS: 10.0000 mg | ORAL_TABLET | Freq: Every morning | ORAL | Status: DC

## 2015-06-26 NOTE — Assessment & Plan Note (Signed)
A1c

## 2015-06-26 NOTE — Assessment & Plan Note (Signed)
refractory

## 2015-06-26 NOTE — Progress Notes (Signed)
Subjective:  Patient ID: Sabrina Page, female    DOB: 01/15/1934  Age: 82 y.o. MRN: [id number]  CC: No chief complaint on file.   HPI Emillee Heinlein presents for asthma, OA, LBP, depression f/u  Outpatient Prescriptions Prior to Visit  Medication Sig Dispense Refill  . aspirin 81 MG tablet Take 81 mg by mouth every morning.     . B Complex-C (SUPER B COMPLEX PO) Take 1 tablet by mouth every morning.     . Cholecalciferol 1000 UNITS capsule Take 1,000 Units by mouth every morning.     . clonazePAM (KLONOPIN) 0.5 MG tablet TAKE 1 TABLET BY MOUTH TWICE A DAY AS NEEDED FOR ANXIETY/INOMNIA 60 tablet 2  . FLUoxetine (PROZAC) 20 MG tablet Take 1 tablet (20 mg total) by mouth daily. 30 tablet 11  . Fluticasone Furoate-Vilanterol (BREO ELLIPTA) 200-25 MCG/INH AEPB Inhale 1 puff into the lungs daily. 30 each 11  . indapamide (LOZOL) 1.25 MG tablet TAKE 1 TABLET BY MOUTH EVERY MORNING FOR SWELLING 30 tablet 5  . KLOR-CON 8 MEQ tablet TAKE 1 TABLET EVERY MORNING 30 tablet 5  . Omega-3 Fatty Acids (FISH OIL) 1000 MG CAPS Take 1 capsule by mouth every morning.     Marland Kitchen omeprazole (PRILOSEC) 20 MG capsule Take 2 capsules (40 mg total) by mouth daily. 180 capsule 3  . potassium chloride (KLOR-CON 10) 10 MEQ tablet Take 1 tablet (10 mEq total) by mouth daily. 30 tablet 11  . traMADol (ULTRAM) 50 MG tablet TAKE 1 TABLET BY MOUTH EVERY 6 TO 8 HOURS AS NEEDED FOR PAIN 120 tablet 1  . loratadine (CLARITIN) 10 MG tablet Take 1 tablet (10 mg total) by mouth every morning. 100 tablet 3   No facility-administered medications prior to visit.    ROS Review of Systems  Constitutional: Negative for chills, activity change, appetite change, fatigue and unexpected weight change.  HENT: Negative for congestion, mouth sores and sinus pressure.   Eyes: Negative for visual disturbance.  Respiratory: Positive for shortness of breath. Negative for cough, chest tightness and wheezing.   Gastrointestinal:  Negative for nausea, vomiting and abdominal pain.  Genitourinary: Negative for frequency, difficulty urinating and vaginal pain.  Musculoskeletal: Positive for back pain and gait problem.  Skin: Negative for pallor and rash.  Neurological: Negative for dizziness, tremors, weakness, numbness and headaches.  Psychiatric/Behavioral: Positive for dysphoric mood. Negative for suicidal ideas, confusion and sleep disturbance. The patient is nervous/anxious.     Objective:  BP 120/80 mmHg  Pulse 83  Wt 230 lb (104.327 kg)  SpO2 94%  BP Readings from Last 3 Encounters:  06/26/15 120/80  06/20/15 124/60  03/25/15 118/64    Wt Readings from Last 3 Encounters:  06/26/15 230 lb (104.327 kg)  06/20/15 230 lb (104.327 kg)  03/25/15 233 lb (105.688 kg)    Physical Exam  Constitutional: She appears well-developed. No distress.  HENT:  Head: Normocephalic.  Right Ear: External ear normal.  Left Ear: External ear normal.  Nose: Nose normal.  Mouth/Throat: Oropharynx is clear and moist.  Eyes: Conjunctivae are normal. Pupils are equal, round, and reactive to light. Right eye exhibits no discharge. Left eye exhibits no discharge.  Neck: Normal range of motion. Neck supple. No JVD present. No tracheal deviation present. No thyromegaly present.  Cardiovascular: Normal rate, regular rhythm and normal heart sounds.   Pulmonary/Chest: No stridor. No respiratory distress. She has no wheezes.  Abdominal: Soft. Bowel sounds are normal. She exhibits no distension and  no mass. There is no tenderness. There is no rebound and no guarding.  Musculoskeletal: She exhibits tenderness. She exhibits no edema.  Lymphadenopathy:    She has no cervical adenopathy.  Neurological: She displays normal reflexes. No cranial nerve deficit. She exhibits normal muscle tone. Coordination abnormal.  Skin: No rash noted. No erythema.  Psychiatric: She has a normal mood and affect. Her behavior is normal. Judgment and thought  content normal.  Obese LS tender B carotid bruit  Lab Results  Component Value Date   WBC 5.4 06/26/2015   HGB 14.9 06/26/2015   HCT 46.0 06/26/2015   PLT 195.0 06/26/2015   GLUCOSE 105* 06/26/2015   CHOL 270* 06/12/2014   TRIG 132.0 06/12/2014   HDL 52.40 06/12/2014   LDLDIRECT 159.1 05/16/2012   LDLCALC 191* 06/12/2014   ALT 17 06/26/2015   AST 18 06/26/2015   NA 139 06/26/2015   K 4.5 06/26/2015   CL 103 06/26/2015   CREATININE 0.96 06/26/2015   BUN 13 06/26/2015   CO2 29 06/26/2015   TSH 1.75 06/26/2015   INR 1.07 09/06/2011   HGBA1C 5.8 09/13/2014    Mm Digital Screening Bilateral  04/14/2015  CLINICAL DATA:  Screening. EXAM: DIGITAL SCREENING BILATERAL MAMMOGRAM WITH CAD COMPARISON:  Previous exam(s). ACR Breast Density Category b: There are scattered areas of fibroglandular density. FINDINGS: There are no findings suspicious for malignancy. Images were processed with CAD. IMPRESSION: No mammographic evidence of malignancy. A result letter of this screening mammogram will be mailed directly to the patient. RECOMMENDATION: Screening mammogram in one year. (Code:SM-B-01Y) BI-RADS CATEGORY  1: Negative. Electronically Signed   By: Altamese Cabal M.D.   On: 04/14/2015 16:56    Assessment & Plan:   Diagnoses and all orders for this visit:  LBP radiating to both legs  Essential hypertension  Asthma, chronic, mild intermittent, uncomplicated  OSA (obstructive sleep apnea)  ABNORMAL GLUCOSE NEC -     Basic metabolic panel; Future -     Urinalysis; Future -     CBC with Differential/Platelet; Future -     Hepatic function panel; Future -     TSH; Future  Morbid obesity, unspecified obesity type ([place])  Bilateral carotid bruits -     US Carotid Bilateral -     Basic metabolic panel; Future -     Urinalysis; Future -     CBC with Differential/Platelet; Future -     Hepatic function panel; Future -     TSH; Future  Other orders -     loratadine (CLARITIN)  10 MG tablet; Take 1 tablet (10 mg total) by mouth every morning.   I am having Ms. Ohayon maintain her aspirin, Fish Oil, Cholecalciferol, B Complex-C (SUPER B COMPLEX PO), potassium chloride, Fluticasone Furoate-Vilanterol, omeprazole, indapamide, KLOR-CON, FLUoxetine, traMADol, clonazePAM, and loratadine.  Meds ordered this encounter  Medications  . loratadine (CLARITIN) 10 MG tablet    Sig: Take 1 tablet (10 mg total) by mouth every morning.    Dispense:  100 tablet    Refill:  3     Follow-up: Return in about 3 months (around 09/23/2015) for a follow-up visit.  [name], MD

## 2015-06-26 NOTE — Progress Notes (Signed)
Pre visit review using our clinic review tool, if applicable. No additional management support is needed unless otherwise documented below in the visit note. 

## 2015-06-26 NOTE — Assessment & Plan Note (Signed)
Off meds - NAS diet 

## 2015-06-26 NOTE — Assessment & Plan Note (Signed)
On CPAP. ?

## 2015-06-26 NOTE — Assessment & Plan Note (Signed)
Wt loss is needed

## 2015-06-26 NOTE — Assessment & Plan Note (Signed)
On Breo 

## 2015-06-26 NOTE — Assessment & Plan Note (Signed)
Vasc US

## 2015-07-23 NOTE — Addendum Note (Signed)
Addended by: Cresenciano Lick on: 07/23/2015 02:53 PM   Modules accepted: Orders

## 2015-07-28 ENCOUNTER — Ambulatory Visit (HOSPITAL_COMMUNITY)
Admission: RE | Admit: 2015-07-28 | Discharge: 2015-07-28 | Disposition: A | Discharge status: Home / Self Care | Payer: Medicare Other | Source: Ambulatory Visit | Attending: Cardiovascular Disease | Admitting: Cardiovascular Disease

## 2015-07-28 DIAGNOSIS — E785 Hyperlipidemia, unspecified: Secondary | ICD-10-CM | POA: Diagnosis not present

## 2015-07-28 DIAGNOSIS — I6523 Occlusion and stenosis of bilateral carotid arteries: Secondary | ICD-10-CM | POA: Insufficient documentation

## 2015-07-28 DIAGNOSIS — I129 Hypertensive chronic kidney disease with stage 1 through stage 4 chronic kidney disease, or unspecified chronic kidney disease: Secondary | ICD-10-CM | POA: Diagnosis not present

## 2015-07-28 DIAGNOSIS — R0989 Other specified symptoms and signs involving the circulatory and respiratory systems: Secondary | ICD-10-CM

## 2015-07-28 DIAGNOSIS — N189 Chronic kidney disease, unspecified: Secondary | ICD-10-CM | POA: Insufficient documentation

## 2015-07-29 ENCOUNTER — Other Ambulatory Visit: Payer: Self-pay | Admitting: Internal Medicine

## 2015-07-29 NOTE — Telephone Encounter (Signed)
Ok to Rf in PCP's absence? Thanks! 

## 2015-07-29 NOTE — Telephone Encounter (Signed)
Rx printed/signed/faxed to pharmacy. See meds.

## 2015-08-01 DIAGNOSIS — H5203 Hypermetropia, bilateral: Secondary | ICD-10-CM | POA: Diagnosis not present

## 2015-08-01 DIAGNOSIS — H43813 Vitreous degeneration, bilateral: Secondary | ICD-10-CM | POA: Diagnosis not present

## 2015-08-01 DIAGNOSIS — H04123 Dry eye syndrome of bilateral lacrimal glands: Secondary | ICD-10-CM | POA: Diagnosis not present

## 2015-08-01 DIAGNOSIS — H52203 Unspecified astigmatism, bilateral: Secondary | ICD-10-CM | POA: Diagnosis not present

## 2015-08-01 DIAGNOSIS — H2513 Age-related nuclear cataract, bilateral: Secondary | ICD-10-CM | POA: Diagnosis not present

## 2015-08-06 DIAGNOSIS — H5203 Hypermetropia, bilateral: Secondary | ICD-10-CM | POA: Insufficient documentation

## 2015-08-06 DIAGNOSIS — H04123 Dry eye syndrome of bilateral lacrimal glands: Secondary | ICD-10-CM | POA: Insufficient documentation

## 2015-08-06 DIAGNOSIS — H43813 Vitreous degeneration, bilateral: Secondary | ICD-10-CM | POA: Insufficient documentation

## 2015-08-06 DIAGNOSIS — H52203 Unspecified astigmatism, bilateral: Secondary | ICD-10-CM | POA: Insufficient documentation

## 2015-08-24 ENCOUNTER — Other Ambulatory Visit: Payer: Self-pay | Admitting: Internal Medicine

## 2015-09-01 ENCOUNTER — Other Ambulatory Visit: Payer: Self-pay | Admitting: Internal Medicine

## 2015-09-02 ENCOUNTER — Other Ambulatory Visit: Payer: Self-pay | Admitting: Internal Medicine

## 2015-09-04 ENCOUNTER — Other Ambulatory Visit: Payer: Self-pay | Admitting: Internal Medicine

## 2015-09-23 ENCOUNTER — Ambulatory Visit: Payer: Self-pay | Admitting: Internal Medicine

## 2015-09-23 ENCOUNTER — Other Ambulatory Visit (INDEPENDENT_AMBULATORY_CARE_PROVIDER_SITE_OTHER): Payer: Medicare Other

## 2015-09-23 ENCOUNTER — Encounter: Payer: Self-pay | Admitting: Internal Medicine

## 2015-09-23 ENCOUNTER — Ambulatory Visit (INDEPENDENT_AMBULATORY_CARE_PROVIDER_SITE_OTHER): Payer: Medicare Other | Admitting: Internal Medicine

## 2015-09-23 VITALS — BP 108/68 | HR 79 | Temp 97.5°F | Ht 64.0 in | Wt 226.8 lb

## 2015-09-23 DIAGNOSIS — R296 Repeated falls: Secondary | ICD-10-CM

## 2015-09-23 DIAGNOSIS — M545 Low back pain: Secondary | ICD-10-CM

## 2015-09-23 DIAGNOSIS — M79604 Pain in right leg: Secondary | ICD-10-CM

## 2015-09-23 DIAGNOSIS — J4520 Mild intermittent asthma, uncomplicated: Secondary | ICD-10-CM | POA: Diagnosis not present

## 2015-09-23 DIAGNOSIS — Z23 Encounter for immunization: Secondary | ICD-10-CM | POA: Diagnosis not present

## 2015-09-23 DIAGNOSIS — M79605 Pain in left leg: Secondary | ICD-10-CM

## 2015-09-23 DIAGNOSIS — I10 Essential (primary) hypertension: Secondary | ICD-10-CM

## 2015-09-23 LAB — BASIC METABOLIC PANEL
BUN: 17 mg/dL (ref 6–23)
CO2: 32 mEq/L (ref 19–32)
Calcium: 9.6 mg/dL (ref 8.4–10.5)
Chloride: 98 mEq/L (ref 96–112)
Creatinine, Ser: 1.08 mg/dL (ref 0.40–1.20)
GFR: 51.88 mL/min — ABNORMAL LOW (ref >60.00)
Glucose, Bld: 94 mg/dL (ref 70–99)
Potassium: 4.3 mEq/L (ref 3.5–5.1)
Sodium: 137 mEq/L (ref 135–145)

## 2015-09-23 MED ORDER — TRAMADOL HCL 50 MG PO TABS: 25.0000 mg | ORAL_TABLET | Freq: Four times a day (QID) | ORAL | Status: DC | PRN

## 2015-09-23 NOTE — Assessment & Plan Note (Signed)
Chronic Indapamide po (stopped)

## 2015-09-23 NOTE — Assessment & Plan Note (Signed)
Discussed wt loss, exercise

## 2015-09-23 NOTE — Assessment & Plan Note (Signed)
Breo

## 2015-09-23 NOTE — Progress Notes (Signed)
Subjective:  Patient ID: Sabrina Page, female    DOB: [birth year]  Age: 82 y.o. MRN: [id number]  CC: No chief complaint on file.   HPI Pamelia Herzig presents for R supraclav pain - all the time - many years, low BP in am, dizziness.  Outpatient Prescriptions Prior to Visit  Medication Sig Dispense Refill  . aspirin 81 MG tablet Take 81 mg by mouth every morning.     . B Complex-C (SUPER B COMPLEX PO) Take 1 tablet by mouth every morning.     . Cholecalciferol 1000 UNITS capsule Take 1,000 Units by mouth every morning.     . clonazePAM (KLONOPIN) 0.5 MG tablet TAKE 1 TABLET BY MOUTH TWICE A DAY AS NEEDED FOR ANXIETY/INOMNIA 60 tablet 2  . FLUoxetine (PROZAC) 20 MG tablet Take 1 tablet (20 mg total) by mouth daily. 30 tablet 11  . Fluticasone Furoate-Vilanterol (BREO ELLIPTA) 200-25 MCG/INH AEPB Inhale 1 puff into the lungs daily. 30 each 11  . indapamide (LOZOL) 1.25 MG tablet TAKE 1 TABLET BY MOUTH EVERY MORNING FOR SWELLING 30 tablet 5  . loratadine (CLARITIN) 10 MG tablet Take 1 tablet (10 mg total) by mouth every morning. 100 tablet 3  . Omega-3 Fatty Acids (FISH OIL) 1000 MG CAPS Take 1 capsule by mouth every morning.     Marland Kitchen omeprazole (PRILOSEC) 20 MG capsule Take 2 capsules (40 mg total) by mouth daily. 180 capsule 3  . potassium chloride (KLOR-CON 10) 10 MEQ tablet Take 1 tablet (10 mEq total) by mouth daily. 30 tablet 11  . traMADol (ULTRAM) 50 MG tablet TAKE 1 TABLET EVERY 6 TO 8 HOURS AS NEEDED FOR PAIN 120 tablet 0  . KLOR-CON 8 MEQ tablet TAKE 1 TABLET EVERY MORNING 30 tablet 5  . KLOR-CON 8 MEQ tablet TAKE 1 TABLET EVERY MORNING 30 tablet 5   No facility-administered medications prior to visit.    ROS Review of Systems  Constitutional: Positive for fatigue. Negative for chills, activity change, appetite change and unexpected weight change.  HENT: Negative for congestion, mouth sores and sinus pressure.   Eyes: Negative for visual disturbance.    Respiratory: Negative for cough and chest tightness.   Gastrointestinal: Negative for nausea and abdominal pain.  Genitourinary: Positive for urgency. Negative for frequency, difficulty urinating and vaginal pain.  Musculoskeletal: Positive for back pain and arthralgias. Negative for gait problem.  Skin: Negative for pallor and rash.  Neurological: Negative for dizziness, tremors, weakness, numbness and headaches.  Psychiatric/Behavioral: Positive for decreased concentration. Negative for confusion, sleep disturbance and dysphoric mood.    Objective:  BP 108/68 mmHg  Pulse 79  Temp(Src) 97.5 F (36.4 C) (Oral)  Ht 5\' 4"  (1.626 m)  Wt 226 lb 12 oz (102.853 kg)  BMI 38.90 kg/m2  SpO2 95%  BP Readings from Last 3 Encounters:  09/23/15 108/68  06/26/15 120/80  06/20/15 124/60    Wt Readings from Last 3 Encounters:  09/23/15 226 lb 12 oz (102.853 kg)  06/26/15 230 lb (104.327 kg)  06/20/15 230 lb (104.327 kg)    Physical Exam  Constitutional: She appears well-developed. No distress.  HENT:  Head: Normocephalic.  Right Ear: External ear normal.  Left Ear: External ear normal.  Nose: Nose normal.  Mouth/Throat: Oropharynx is clear and moist.  Eyes: Conjunctivae are normal. Pupils are equal, round, and reactive to light. Right eye exhibits no discharge. Left eye exhibits no discharge.  Neck: Normal range of motion. Neck supple. No JVD present. No  tracheal deviation present. No thyromegaly present.  Cardiovascular: Normal rate, regular rhythm and normal heart sounds.   Pulmonary/Chest: No stridor. No respiratory distress. She has no wheezes.  Abdominal: Soft. Bowel sounds are normal. She exhibits no distension and no mass. There is no tenderness. There is no rebound and no guarding.  Musculoskeletal: She exhibits tenderness. She exhibits no edema.  Lymphadenopathy:    She has no cervical adenopathy.  Neurological: She displays normal reflexes. No cranial nerve deficit. She  exhibits normal muscle tone. Coordination abnormal.  Skin: No rash noted. No erythema.  Psychiatric: She has a normal mood and affect. Her behavior is normal. Judgment and thought content normal.  Obese  Lab Results  Component Value Date   WBC 5.4 06/26/2015   HGB 14.9 06/26/2015   HCT 46.0 06/26/2015   PLT 195.0 06/26/2015   GLUCOSE 105* 06/26/2015   CHOL 270* 06/12/2014   TRIG 132.0 06/12/2014   HDL 52.40 06/12/2014   LDLDIRECT 159.1 05/16/2012   LDLCALC 191* 06/12/2014   ALT 17 06/26/2015   AST 18 06/26/2015   NA 139 06/26/2015   K 4.5 06/26/2015   CL 103 06/26/2015   CREATININE 0.96 06/26/2015   BUN 13 06/26/2015   CO2 29 06/26/2015   TSH 1.75 06/26/2015   INR 1.07 09/06/2011   HGBA1C 5.8 09/13/2014    No results found.  Assessment & Plan:   There are no diagnoses linked to this encounter. I have discontinued Ms. Kottke KLOR-CON and KLOR-CON. I am also having her maintain her aspirin, Fish Oil, Cholecalciferol, B Complex-C (SUPER B COMPLEX PO), potassium chloride, fluticasone furoate-vilanterol, omeprazole, indapamide, FLUoxetine, clonazePAM, loratadine, and traMADol.  No orders of the defined types were placed in this encounter.     Follow-up: No Follow-up on file.  [name], MD

## 2015-09-23 NOTE — Assessment & Plan Note (Signed)
Cont w/wt loss 

## 2015-09-23 NOTE — Assessment & Plan Note (Signed)
Tramadol prn ° Potential benefits of a long term opioids use as well as potential risks (i.e. addiction risk, apnea etc) and complications (i.e. Somnolence, constipation and others) were explained to the patient and were aknowledged. ° ° °

## 2015-09-23 NOTE — Progress Notes (Signed)
Pre visit review using our clinic review tool, if applicable. No additional management support is needed unless otherwise documented below in the visit note. 

## 2015-10-29 DIAGNOSIS — H2512 Age-related nuclear cataract, left eye: Secondary | ICD-10-CM | POA: Diagnosis not present

## 2015-10-29 DIAGNOSIS — Z01818 Encounter for other preprocedural examination: Secondary | ICD-10-CM | POA: Diagnosis not present

## 2015-10-29 DIAGNOSIS — H04123 Dry eye syndrome of bilateral lacrimal glands: Secondary | ICD-10-CM | POA: Diagnosis not present

## 2015-10-29 DIAGNOSIS — H43813 Vitreous degeneration, bilateral: Secondary | ICD-10-CM | POA: Diagnosis not present

## 2015-10-29 DIAGNOSIS — H2511 Age-related nuclear cataract, right eye: Secondary | ICD-10-CM | POA: Diagnosis not present

## 2015-11-05 DIAGNOSIS — H269 Unspecified cataract: Secondary | ICD-10-CM | POA: Diagnosis not present

## 2015-11-05 DIAGNOSIS — H25811 Combined forms of age-related cataract, right eye: Secondary | ICD-10-CM | POA: Diagnosis not present

## 2015-11-05 DIAGNOSIS — H2511 Age-related nuclear cataract, right eye: Secondary | ICD-10-CM | POA: Diagnosis not present

## 2015-11-12 DIAGNOSIS — H2512 Age-related nuclear cataract, left eye: Secondary | ICD-10-CM | POA: Diagnosis not present

## 2015-11-19 DIAGNOSIS — H2512 Age-related nuclear cataract, left eye: Secondary | ICD-10-CM | POA: Diagnosis not present

## 2015-11-19 DIAGNOSIS — H269 Unspecified cataract: Secondary | ICD-10-CM | POA: Diagnosis not present

## 2015-11-19 DIAGNOSIS — H25812 Combined forms of age-related cataract, left eye: Secondary | ICD-10-CM | POA: Diagnosis not present

## 2015-11-20 DIAGNOSIS — Z961 Presence of intraocular lens: Secondary | ICD-10-CM | POA: Insufficient documentation

## 2015-12-10 ENCOUNTER — Telehealth: Payer: Self-pay | Admitting: *Deleted

## 2015-12-10 MED ORDER — CLONAZEPAM 0.5 MG PO TABS: ORAL_TABLET | ORAL | Status: DC

## 2015-12-10 NOTE — Telephone Encounter (Signed)
OK to fill this prescription with additional refills x2 Thank you!  

## 2015-12-10 NOTE — Telephone Encounter (Signed)
Faxed script back to [facility].../lmb 

## 2015-12-10 NOTE — Telephone Encounter (Signed)
Receive fax pt requesting refill on her Clonazepam 0.5mg . Last filled 10/20/15...[name]

## 2015-12-16 ENCOUNTER — Encounter: Payer: Self-pay | Admitting: Internal Medicine

## 2015-12-16 ENCOUNTER — Ambulatory Visit (INDEPENDENT_AMBULATORY_CARE_PROVIDER_SITE_OTHER): Payer: Medicare Other | Admitting: Internal Medicine

## 2015-12-16 DIAGNOSIS — G4733 Obstructive sleep apnea (adult) (pediatric): Secondary | ICD-10-CM | POA: Diagnosis not present

## 2015-12-16 DIAGNOSIS — R0609 Other forms of dyspnea: Secondary | ICD-10-CM

## 2015-12-16 DIAGNOSIS — R0600 Dyspnea, unspecified: Secondary | ICD-10-CM

## 2015-12-16 DIAGNOSIS — J4540 Moderate persistent asthma, uncomplicated: Secondary | ICD-10-CM | POA: Diagnosis not present

## 2015-12-16 NOTE — Progress Notes (Signed)
Subjective:    Patient ID: Sabrina Page, female    DOB: 19-Mar-1934, 82 y.o.   MRN: [id number]  HPI 07/05/14 Dr Gwenette Greet The patient comes in today for follow-up of her obstructive sleep apnea. She is gradually getting more accustomed to her device, and her downloaded shows 4 hours of usage a night. She has excellent control of her AHI, and no significant mask leak. She tells me that she continues to have sleep disruption because of the pull of the hose on her mask.  She wishes to try something different that may be easier.// Dr C suggested nasal pillows and weight loss.  02/13/15- 89 yoF  Followed for OSA, chronic insomnia, complicated by asthma, HBP, depression,  Unattended Home Sleep Test 07/30/13  AHI 19/ hr, desat to 78%,  Weight 226 lbs Former [place] pt; has not been wearing CPAP/[facility]-husband disconnected it-has worn in past few days since daughter connected again. DME-[facility] sent her a letter stating no coverage due to not being compliant. Has felt better since using now-had fallen 5 times previously, was not sleeping well, breathing better.  Daughter here helps with [place] translation from [place] They report low oxygen saturation when she first gets up in the mornings. She has not been wearing CPAP reliably. Has had some falls. Former smoker with long history of asthma as a child and young woman. They ask what her lung function is like. Office Spirometry 02/13/2015-WNL  06/20/2015-82 year old female former smoker followed for OSA, chronic insomnia, complicated by asthma, HBP, depression CPAP auto 5-15/ [facility] Follows for- pt states her mask is uncomfortable- falling off and sliding on face.  Also c/o shortness of breath.   Myocardial Perfusion study 08/03/13- no ischemia She comes today with translator and with her daughter whose English is pretty good. Because the patient speaks little [place], she tends not to answer the phone and has not been in contact with [facility]. We reviewed  download demonstrating good control but inadequate use. Mostly she gets frustrated and doesn't know how to make comfort adjustments. She was out of sorts yesterday, nonspecific description of multiple aches and pains with some shortness of breath when she bends over. They got a little dog because her husband was depressed and unhappy. She feels it may be too much trouble to take care of. It is not causing any direct breathing problems.   12/16/2015-82 year old female former smoker followed for OSA, chronic insomnia, complicated by asthma, allergic rhinitis, HBP, depression CPAP auto 5-15/[facility]    daughter here is Optometrist FOLLOWS FOR: Wears CPAP machine nightly; DME: [facility]. DL attached In much better spirits this visit. Cataract surgery a month ago. Little cough or wheeze. Some shortness of breath especially in hot weather when outdoors. No acute change. Occasionally pulls CPAP off at night. Download shows 50% 4 hour compliance with AHI 0.3. She says her dog is afraid of her CPAP mask. Still naps a lot for about a half hour at a time think she sleeps pretty soundly.  Review of Systems  Constitutional: Negative for fever and unexpected weight change.  HENT: Negative for congestion, dental problem, ear pain, nosebleeds, postnasal drip, rhinorrhea, sinus pressure, sneezing, sore throat and trouble swallowing.   Eyes: Negative for redness and itching.  Respiratory: Negative for cough, chest tightness, + shortness of breath and wheezing.   Cardiovascular: Negative for palpitations and leg swelling.  Gastrointestinal: Negative for nausea and vomiting.  Genitourinary: Negative for dysuria.  Musculoskeletal: Negative for joint swelling.  Skin: Negative for rash.  Neurological: Negative for headaches.  Hematological: Does not bruise/bleed easily.  Psychiatric/Behavioral: Negative for dysphoric mood. The patient is not nervous/anxious.      Objective:  OBJ- Physical Exam + limited  English/daughter translated General- Alert, Oriented, Affect-appropriate, Distress- none acute, + overweight Skin- rash-none, lesions- none, excoriation- none Lymphadenopathy- none Head- atraumatic            Eyes- Gross vision intact, PERRLA, conjunctivae and secretions clear            Ears- Hearing, canals-normal            Nose- Clear, no-Septal dev, mucus, polyps, erosion, perforation             Throat- Mallampati III , mucosa clear , drainage- none, tonsils- atrophic, + full dentures Neck- flexible , trachea midline, no stridor , thyroid nl, carotid no bruit Chest - symmetrical excursion , unlabored           Heart/CV- RRR , no murmur , no gallop  , no rub, nl s1 s2                           - JVD- none , edema- none, stasis changes- none, varices- none           Lung- clear to P&A, wheeze- none, cough- none , dullness-none, rub- none           Chest wall-  Abd-  Br/ Gen/ Rectal- Not done, not indicated Extrem- cyanosis- none, clubbing, none, atrophy- none, strength- nl Neuro- grossly intact to observation  Assessment & Plan:

## 2015-12-16 NOTE — Assessment & Plan Note (Signed)
Currently good control. Rare need for rescue inhaler and doing particularly well now during summer

## 2015-12-16 NOTE — Patient Instructions (Signed)
We can continue CPAP auto 5-15, mask of choice, supplies, humidifier, AirView    Dx OSA  Please call if needed

## 2015-12-16 NOTE — Assessment & Plan Note (Signed)
Explained CPAP compliance goals again. She seems comfortable most of the time. Not clear if the reason she takes mask off during the night is part of the reason that she feels sleepy in the daytime with implied sleep disturbance. Some communication is lost through her daughters translating but she is going to try to pay more attention. She likes her nasal pillows mask better than previous fullface.

## 2015-12-16 NOTE — Assessment & Plan Note (Signed)
Obesity and deconditioning remain prime suspects. Lungs today are clear. Recorded spirometry was normal previously and heart sounds are normal.

## 2015-12-19 ENCOUNTER — Ambulatory Visit: Payer: Self-pay | Admitting: Internal Medicine

## 2015-12-25 ENCOUNTER — Other Ambulatory Visit (INDEPENDENT_AMBULATORY_CARE_PROVIDER_SITE_OTHER): Payer: Medicare Other

## 2015-12-25 ENCOUNTER — Encounter: Payer: Self-pay | Admitting: Internal Medicine

## 2015-12-25 ENCOUNTER — Ambulatory Visit (INDEPENDENT_AMBULATORY_CARE_PROVIDER_SITE_OTHER): Payer: Medicare Other | Admitting: Internal Medicine

## 2015-12-25 VITALS — BP 118/62 | HR 81 | Wt 229.0 lb

## 2015-12-25 DIAGNOSIS — J4540 Moderate persistent asthma, uncomplicated: Secondary | ICD-10-CM | POA: Diagnosis not present

## 2015-12-25 DIAGNOSIS — R7309 Other abnormal glucose: Secondary | ICD-10-CM

## 2015-12-25 DIAGNOSIS — F32A Depression, unspecified: Secondary | ICD-10-CM

## 2015-12-25 DIAGNOSIS — G8929 Other chronic pain: Secondary | ICD-10-CM | POA: Diagnosis not present

## 2015-12-25 DIAGNOSIS — G4700 Insomnia, unspecified: Secondary | ICD-10-CM

## 2015-12-25 DIAGNOSIS — I10 Essential (primary) hypertension: Secondary | ICD-10-CM

## 2015-12-25 DIAGNOSIS — M25552 Pain in left hip: Secondary | ICD-10-CM | POA: Diagnosis not present

## 2015-12-25 DIAGNOSIS — G4733 Obstructive sleep apnea (adult) (pediatric): Secondary | ICD-10-CM | POA: Diagnosis not present

## 2015-12-25 DIAGNOSIS — M25559 Pain in unspecified hip: Secondary | ICD-10-CM

## 2015-12-25 DIAGNOSIS — F329 Major depressive disorder, single episode, unspecified: Secondary | ICD-10-CM | POA: Diagnosis not present

## 2015-12-25 DIAGNOSIS — Z9989 Dependence on other enabling machines and devices: Secondary | ICD-10-CM

## 2015-12-25 LAB — HEPATIC FUNCTION PANEL
ALT: 14 U/L (ref 0–35)
AST: 15 U/L (ref 0–37)
Albumin: 3.9 g/dL (ref 3.5–5.2)
Alkaline Phosphatase: 54 U/L (ref 39–117)
Bilirubin, Direct: 0.1 mg/dL (ref 0.0–0.3)
Total Bilirubin: 0.6 mg/dL (ref 0.2–1.2)
Total Protein: 7 g/dL (ref 6.0–8.3)

## 2015-12-25 LAB — BASIC METABOLIC PANEL
BUN: 15 mg/dL (ref 6–23)
CO2: 32 mEq/L (ref 19–32)
Calcium: 9.3 mg/dL (ref 8.4–10.5)
Chloride: 103 mEq/L (ref 96–112)
Creatinine, Ser: 0.96 mg/dL (ref 0.40–1.20)
GFR: 59.4 mL/min — ABNORMAL LOW (ref >60.00)
Glucose, Bld: 104 mg/dL — ABNORMAL HIGH (ref 70–99)
Potassium: 4.1 mEq/L (ref 3.5–5.1)
Sodium: 139 mEq/L (ref 135–145)

## 2015-12-25 LAB — HEMOGLOBIN A1C: Hgb A1c MFr Bld: 5.6 % (ref 4.6–6.5)

## 2015-12-25 MED ORDER — METHYLPREDNISOLONE ACETATE 80 MG/ML IJ SUSP
80.0000 mg | Freq: Once | INTRAMUSCULAR | Status: AC
  Administered 2015-12-25: 80 mg via INTRA_ARTICULAR

## 2015-12-25 NOTE — Assessment & Plan Note (Signed)
On Prozac 

## 2015-12-25 NOTE — Assessment & Plan Note (Signed)
Better on CPAP 

## 2015-12-25 NOTE — Assessment & Plan Note (Signed)
7/17 L - bursitis See Procedure

## 2015-12-25 NOTE — Assessment & Plan Note (Signed)
On CPAP. ?

## 2015-12-25 NOTE — Patient Instructions (Signed)
Postprocedure instructions :    A Band-Aid should be left on for 12 hours. Injection therapy is not a cure itself. It is used in conjunction with other modalities. You can use nonsteroidal anti-inflammatories like ibuprofen , hot and cold compresses. Rest is recommended in the next 24 hours. You need to report immediately  if fever, chills or any signs of infection develop. 

## 2015-12-25 NOTE — Assessment & Plan Note (Signed)
BP Readings from Last 3 Encounters:  12/25/15 118/62  12/16/15 122/68  09/23/15 108/68

## 2015-12-25 NOTE — Progress Notes (Signed)
Pre visit review using our clinic review tool, if applicable. No additional management support is needed unless otherwise documented below in the visit note. 

## 2015-12-25 NOTE — Assessment & Plan Note (Signed)
Breo Risks associated with treatment noncompliance were discussed. Compliance was encouraged.

## 2015-12-25 NOTE — Progress Notes (Signed)
Subjective:  Patient ID: Sabrina Page, female    DOB: March 05, 1934  Age: 82 y.o. MRN: [id number]  CC: No chief complaint on file.   HPI Sabrina Page presents for asthma, LBP, depression f/u  Outpatient Medications Prior to Visit  Medication Sig Dispense Refill  . aspirin 81 MG tablet Take 81 mg by mouth every morning.     . B Complex-C (SUPER B COMPLEX PO) Take 1 tablet by mouth every morning.     . Cholecalciferol 1000 UNITS capsule Take 1,000 Units by mouth every morning.     . clonazePAM (KLONOPIN) 0.5 MG tablet TAKE 1 TABLET BY MOUTH TWICE A DAY AS NEEDED FOR ANXIETY/INOMNIA 60 tablet 2  . FLUoxetine (PROZAC) 20 MG tablet Take 1 tablet (20 mg total) by mouth daily. 30 tablet 11  . Fluticasone Furoate-Vilanterol (BREO ELLIPTA) 200-25 MCG/INH AEPB Inhale 1 puff into the lungs daily. 30 each 11  . loratadine (CLARITIN) 10 MG tablet Take 1 tablet (10 mg total) by mouth every morning. 100 tablet 3  . Omega-3 Fatty Acids (FISH OIL) 1000 MG CAPS Take 1 capsule by mouth every morning.     Marland Kitchen omeprazole (PRILOSEC) 20 MG capsule Take 2 capsules (40 mg total) by mouth daily. 180 capsule 3  . traMADol (ULTRAM) 50 MG tablet Take 0.5-1 tablets (25-50 mg total) by mouth every 6 (six) hours as needed. 120 tablet 3   No facility-administered medications prior to visit.     ROS Review of Systems  Constitutional: Negative for activity change, appetite change, chills, fatigue and unexpected weight change.  HENT: Negative for congestion, mouth sores and sinus pressure.   Eyes: Negative for visual disturbance.  Respiratory: Positive for wheezing. Negative for cough and chest tightness.   Gastrointestinal: Negative for abdominal pain and nausea.  Genitourinary: Negative for difficulty urinating, frequency and vaginal pain.  Musculoskeletal: Positive for arthralgias and back pain. Negative for gait problem.  Skin: Negative for pallor and rash.  Neurological: Negative for dizziness,  tremors, weakness, numbness and headaches.  Psychiatric/Behavioral: Positive for decreased concentration and sleep disturbance. Negative for confusion. The patient is nervous/anxious.     Objective:  BP 118/62   Pulse 81   Wt 229 lb (103.9 kg)   SpO2 96%   BMI 39.31 kg/m   BP Readings from Last 3 Encounters:  12/25/15 118/62  12/16/15 122/68  09/23/15 108/68    Wt Readings from Last 3 Encounters:  12/25/15 229 lb (103.9 kg)  12/16/15 228 lb (103.4 kg)  09/23/15 226 lb 12 oz (102.9 kg)    Physical Exam  Constitutional: She appears well-developed. No distress.  HENT:  Head: Normocephalic.  Right Ear: External ear normal.  Left Ear: External ear normal.  Nose: Nose normal.  Mouth/Throat: Oropharynx is clear and moist.  Eyes: Conjunctivae are normal. Pupils are equal, round, and reactive to light. Right eye exhibits no discharge. Left eye exhibits no discharge.  Neck: Normal range of motion. Neck supple. No JVD present. No tracheal deviation present. No thyromegaly present.  Cardiovascular: Normal rate, regular rhythm and normal heart sounds.   Pulmonary/Chest: No stridor. No respiratory distress. She has no wheezes.  Abdominal: Soft. Bowel sounds are normal. She exhibits no distension and no mass. There is no tenderness. There is no rebound and no guarding.  Musculoskeletal: She exhibits tenderness. She exhibits no edema.  Lymphadenopathy:    She has no cervical adenopathy.  Neurological: She displays normal reflexes. No cranial nerve deficit. She exhibits normal muscle tone.  Coordination abnormal.  Skin: No rash noted. No erythema.  Psychiatric: She has a normal mood and affect. Her behavior is normal. Judgment and thought content normal.  L hip is tender  Lab Results  Component Value Date   WBC 5.4 06/26/2015   HGB 14.9 06/26/2015   HCT 46.0 06/26/2015   PLT 195.0 06/26/2015   GLUCOSE 94 09/23/2015   CHOL 270 (H) 06/12/2014   TRIG 132.0 06/12/2014   HDL 52.40  06/12/2014   LDLDIRECT 159.1 05/16/2012   LDLCALC 191 (H) 06/12/2014   ALT 17 06/26/2015   AST 18 06/26/2015   NA 137 09/23/2015   K 4.3 09/23/2015   CL 98 09/23/2015   CREATININE 1.08 09/23/2015   BUN 17 09/23/2015   CO2 32 09/23/2015   TSH 1.75 06/26/2015   INR 1.07 09/06/2011   HGBA1C 5.8 09/13/2014    Procedure Note :     Procedure : Joint Injection,  L  hip   Indication:  Trochanteric bursitis with refractory  chronic pain.   Risks including unsuccessful procedure , bleeding, infection, bruising, skin atrophy, "steroid flare-up" and others were explained to the patient in detail as well as the benefits. Informed consent was obtained and signed.   Tthe patient was placed in a comfortable lateral decubitus position. The point of maximal tenderness was identified. Skin was prepped with Betadine and alcohol. Then, a 5 cc syringe with a 2 inch long 24-gauge needle was used for a bursa injection.. The needle was advanced  Into the bursa. I injected the bursa with 4 mL of 2% lidocaine and 80 mg of Depo-Medrol .  Band-Aid was applied.   Tolerated well. Complications: None. Good pain relief following the procedure.   Postprocedure instructions :    A Band-Aid should be left on for 12 hours. Injection therapy is not a cure itself. It is used in conjunction with other modalities. You can use nonsteroidal anti-inflammatories like ibuprofen , hot and cold compresses. Rest is recommended in the next 24 hours. You need to report immediately  if fever, chills or any signs of infection develop.    Assessment & Plan:   There are no diagnoses linked to this encounter. I am having Sabrina Page maintain her aspirin, Fish Oil, Cholecalciferol, B Complex-C (SUPER B COMPLEX PO), fluticasone furoate-vilanterol, omeprazole, FLUoxetine, loratadine, traMADol, and clonazePAM.  No orders of the defined types were placed in this encounter.    Follow-up: No Follow-up on file.  [name],  MD

## 2015-12-25 NOTE — Assessment & Plan Note (Addendum)
Wt Readings from Last 3 Encounters:  12/25/15 229 lb (103.9 kg)  12/16/15 228 lb (103.4 kg)  09/23/15 226 lb 12 oz (102.9 kg)   Discussed

## 2015-12-30 ENCOUNTER — Encounter: Payer: Self-pay | Admitting: Internal Medicine

## 2016-01-18 ENCOUNTER — Emergency Department (HOSPITAL_COMMUNITY): Payer: Medicare Other

## 2016-01-18 ENCOUNTER — Encounter (HOSPITAL_COMMUNITY): Payer: Self-pay | Admitting: *Deleted

## 2016-01-18 ENCOUNTER — Observation Stay (HOSPITAL_COMMUNITY)
Admission: EM | Admit: 2016-01-18 | Discharge: 2016-01-19 | Disposition: A | Discharge status: Home / Self Care | Payer: Medicare Other | Attending: Internal Medicine | Admitting: Internal Medicine

## 2016-01-18 DIAGNOSIS — F411 Generalized anxiety disorder: Secondary | ICD-10-CM | POA: Insufficient documentation

## 2016-01-18 DIAGNOSIS — R55 Syncope and collapse: Secondary | ICD-10-CM | POA: Diagnosis not present

## 2016-01-18 DIAGNOSIS — I6523 Occlusion and stenosis of bilateral carotid arteries: Secondary | ICD-10-CM | POA: Insufficient documentation

## 2016-01-18 DIAGNOSIS — Z87891 Personal history of nicotine dependence: Secondary | ICD-10-CM | POA: Insufficient documentation

## 2016-01-18 DIAGNOSIS — J449 Chronic obstructive pulmonary disease, unspecified: Secondary | ICD-10-CM | POA: Insufficient documentation

## 2016-01-18 DIAGNOSIS — Z9049 Acquired absence of other specified parts of digestive tract: Secondary | ICD-10-CM | POA: Insufficient documentation

## 2016-01-18 DIAGNOSIS — R079 Chest pain, unspecified: Secondary | ICD-10-CM

## 2016-01-18 DIAGNOSIS — Z6838 Body mass index (BMI) 38.0-38.9, adult: Secondary | ICD-10-CM | POA: Diagnosis not present

## 2016-01-18 DIAGNOSIS — I739 Peripheral vascular disease, unspecified: Secondary | ICD-10-CM | POA: Insufficient documentation

## 2016-01-18 DIAGNOSIS — I10 Essential (primary) hypertension: Secondary | ICD-10-CM | POA: Insufficient documentation

## 2016-01-18 DIAGNOSIS — R0609 Other forms of dyspnea: Secondary | ICD-10-CM

## 2016-01-18 DIAGNOSIS — Z79899 Other long term (current) drug therapy: Secondary | ICD-10-CM | POA: Insufficient documentation

## 2016-01-18 DIAGNOSIS — Z9181 History of falling: Secondary | ICD-10-CM | POA: Diagnosis not present

## 2016-01-18 DIAGNOSIS — M810 Age-related osteoporosis without current pathological fracture: Secondary | ICD-10-CM | POA: Insufficient documentation

## 2016-01-18 DIAGNOSIS — Z7982 Long term (current) use of aspirin: Secondary | ICD-10-CM | POA: Insufficient documentation

## 2016-01-18 DIAGNOSIS — F329 Major depressive disorder, single episode, unspecified: Secondary | ICD-10-CM | POA: Insufficient documentation

## 2016-01-18 DIAGNOSIS — K219 Gastro-esophageal reflux disease without esophagitis: Secondary | ICD-10-CM | POA: Diagnosis not present

## 2016-01-18 DIAGNOSIS — Y92009 Unspecified place in unspecified non-institutional (private) residence as the place of occurrence of the external cause: Secondary | ICD-10-CM

## 2016-01-18 DIAGNOSIS — I872 Venous insufficiency (chronic) (peripheral): Secondary | ICD-10-CM | POA: Diagnosis not present

## 2016-01-18 DIAGNOSIS — J4540 Moderate persistent asthma, uncomplicated: Secondary | ICD-10-CM | POA: Diagnosis not present

## 2016-01-18 DIAGNOSIS — G4733 Obstructive sleep apnea (adult) (pediatric): Secondary | ICD-10-CM | POA: Diagnosis not present

## 2016-01-18 DIAGNOSIS — I951 Orthostatic hypotension: Principal | ICD-10-CM | POA: Diagnosis present

## 2016-01-18 DIAGNOSIS — R404 Transient alteration of awareness: Secondary | ICD-10-CM | POA: Diagnosis not present

## 2016-01-18 DIAGNOSIS — R42 Dizziness and giddiness: Secondary | ICD-10-CM

## 2016-01-18 DIAGNOSIS — W19XXXA Unspecified fall, initial encounter: Secondary | ICD-10-CM

## 2016-01-18 DIAGNOSIS — R0600 Dyspnea, unspecified: Secondary | ICD-10-CM | POA: Diagnosis present

## 2016-01-18 DIAGNOSIS — Y92099 Unspecified place in other non-institutional residence as the place of occurrence of the external cause: Secondary | ICD-10-CM

## 2016-01-18 LAB — URINALYSIS, ROUTINE W REFLEX MICROSCOPIC
Bilirubin Urine: NEGATIVE
Glucose, UA: NEGATIVE mg/dL
Hgb urine dipstick: NEGATIVE
Ketones, ur: NEGATIVE mg/dL
Leukocytes, UA: NEGATIVE
Nitrite: NEGATIVE
Protein, ur: NEGATIVE mg/dL
Specific Gravity, Urine: 1.007 (ref 1.005–1.030)
pH: 7 (ref 5.0–8.0)

## 2016-01-18 LAB — CBC WITH DIFFERENTIAL/PLATELET
Basophils Absolute: 0 10*3/uL (ref 0.0–0.1)
Basophils Relative: 1 %
Eosinophils Absolute: 0.1 10*3/uL (ref 0.0–0.7)
Eosinophils Relative: 3 %
HCT: 43.3 % (ref 36.0–46.0)
Hemoglobin: 13.7 g/dL (ref 12.0–15.0)
Lymphocytes Relative: 36 %
Lymphs Abs: 1.7 10*3/uL (ref 0.7–4.0)
MCH: 29 pg (ref 26.0–34.0)
MCHC: 31.6 g/dL (ref 30.0–36.0)
MCV: 91.7 fL (ref 78.0–100.0)
Monocytes Absolute: 0.4 10*3/uL (ref 0.1–1.0)
Monocytes Relative: 8 %
Neutro Abs: 2.4 10*3/uL (ref 1.7–7.7)
Neutrophils Relative %: 52 %
Platelets: 147 10*3/uL — ABNORMAL LOW (ref 150–400)
RBC: 4.72 MIL/uL (ref 3.87–5.11)
RDW: 13.1 % (ref 11.5–15.5)
WBC: 4.7 10*3/uL (ref 4.0–10.5)

## 2016-01-18 LAB — BASIC METABOLIC PANEL
Anion gap: 5 (ref 5–15)
BUN: 16 mg/dL (ref 6–20)
CO2: 28 mmol/L (ref 22–32)
Calcium: 8.5 mg/dL — ABNORMAL LOW (ref 8.9–10.3)
Chloride: 104 mmol/L (ref 101–111)
Creatinine, Ser: 0.92 mg/dL (ref 0.44–1.00)
GFR calc Af Amer: >60 mL/min (ref >60)
GFR calc non Af Amer: 57 mL/min — ABNORMAL LOW (ref >60)
Glucose, Bld: 110 mg/dL — ABNORMAL HIGH (ref 65–99)
Potassium: 3.9 mmol/L (ref 3.5–5.1)
Sodium: 137 mmol/L (ref 135–145)

## 2016-01-18 LAB — I-STAT TROPONIN, ED: Troponin i, poc: 0.03 ng/mL (ref 0.00–0.08)

## 2016-01-18 MED ORDER — ACETAMINOPHEN 325 MG PO TABS
650.0000 mg | ORAL_TABLET | Freq: Four times a day (QID) | ORAL | Status: DC | PRN
  Administered 2016-01-18: 650 mg via ORAL
  Filled 2016-01-18: qty 2

## 2016-01-18 MED ORDER — ONDANSETRON HCL 4 MG PO TABS: 4.0000 mg | ORAL_TABLET | Freq: Four times a day (QID) | ORAL | Status: DC | PRN

## 2016-01-18 MED ORDER — ACETAMINOPHEN 650 MG RE SUPP: 650.0000 mg | Freq: Four times a day (QID) | RECTAL | Status: DC | PRN

## 2016-01-18 MED ORDER — ONDANSETRON HCL 4 MG/2ML IJ SOLN: 4.0000 mg | Freq: Four times a day (QID) | INTRAMUSCULAR | Status: DC | PRN

## 2016-01-18 MED ORDER — SODIUM CHLORIDE 0.9% FLUSH
3.0000 mL | Freq: Two times a day (BID) | INTRAVENOUS | Status: DC
  Administered 2016-01-18: 3 mL via INTRAVENOUS

## 2016-01-18 MED ORDER — SODIUM CHLORIDE 0.9 % IV BOLUS (SEPSIS)
500.0000 mL | Freq: Once | INTRAVENOUS | Status: AC
  Administered 2016-01-18: 500 mL via INTRAVENOUS

## 2016-01-18 MED ORDER — ENOXAPARIN SODIUM 40 MG/0.4ML ~~LOC~~ SOLN
40.0000 mg | SUBCUTANEOUS | Status: DC
  Administered 2016-01-18: 40 mg via SUBCUTANEOUS
  Filled 2016-01-18: qty 0.4

## 2016-01-18 NOTE — ED Provider Notes (Signed)
Emergency Department Provider Note   I have reviewed the triage vital signs and the nursing notes.   HISTORY  Chief Complaint Dizziness   HPI Sabrina Page is a 82 y.o. female with PMH of asthma, depression, GERD, HLD, and HTN presents to the emergency department for evaluation of multiple episodes of near syncope. Patient reports lightheadedness with standing and feeling off balance. The daughter at bedside states she has seen the primary care physician to believes her she was vertigo. The patient denies shifting or spinning sensation. The daughter states that the patient typically feels bad in the morning and then as she gets up and walks around she tends to feel better throughout the day. She does not take morning blood pressure medications. This morning she was complaining of continued fatigue and lightheadedness and so the patient's daughter, who is a physical therapist, took orthostatic vital signs and on her blood pressure to be in the 60s when standing. She seemed mildly diaphoretic and was symptomatic during these blood pressure readings. Daughter did not record heart rate. No chest pain but the patient does endorse intermittent heart palpitations.   Past Medical History:  Diagnosis Date  . Allergy    rhinitis  . Asthma   . Chronic kidney disease    hx of cystitis   . COPD (chronic obstructive pulmonary disease) ([place])   . Depression   . Dizziness    vertigo   . Gallstones   . GERD (gastroesophageal reflux disease)   . Hyperlipidemia   . Hypertension   . Obesity   . Osteoarthritis   . Osteoporosis   . Peripheral vascular disease ([facility])    swelling   . Pneumonia   . Shortness of breath    with exertion   . Shoulder injury    left  . Sleep apnea    never diagnosed   . Urinary incontinence     Patient Active Problem List   Diagnosis Date Noted  . Near syncope 01/18/2016  . Hip pain, chronic 12/25/2015  . Carotid bruit 06/26/2015  . Fall at home  02/01/2015  . Concussion without loss of consciousness 09/13/2014  . Morbid obesity ([place]) 03/06/2014  . Right tennis elbow 01/02/2014  . Wart 01/02/2014  . Chronic fatigue 06/25/2013  . External hemorrhoid, bleeding 02/14/2013  . OSA on CPAP 02/10/2012  . LBP radiating to both legs 05/14/2011  . Falls frequently 05/14/2011  . Fatigue 04/22/2011  . Hyperglycemia 04/22/2011  . Neoplasm of uncertain behavior of skin 01/11/2011  . Anxiety state 07/06/2010  . SHOULDER PAIN 05/21/2010  . CONCUSSION WITH LOSS OF CONSCIOUSNESS 05/21/2010  . Actinic keratosis 04/08/2010  . CARPAL TUNNEL SYNDROME 11/28/2009  . NECK PAIN 11/28/2009  . DIZZINESS 11/28/2009  . INSOMNIA, CHRONIC 06/23/2009  . TOBACCO USE, QUIT 04/11/2009  . DIVERTICULOSIS OF COLON 09/06/2008  . DIVERTICULITIS OF COLON 09/06/2008  . Unspecified chest pain 09/06/2008  . GERD 06/05/2008  . OSTEOARTHRITIS 06/05/2008  . ABNORMAL GLUCOSE NEC 06/05/2008  . BRONCHITIS, ACUTE 04/04/2008  . APHTHOUS STOMATITIS 03/25/2008  . Venous (peripheral) insufficiency 11/03/2007  . Edema 11/03/2007  . SHINGLES 10/23/2007  . DENTAL PAIN 10/23/2007  . PARESTHESIA 10/23/2007  . Pain in limb 07/06/2007  . HYPERLIPIDEMIA 02/17/2007  . Depression 02/17/2007  . Essential hypertension 02/17/2007  . ALLERGIC RHINITIS 02/17/2007  . Asthma, moderate persistent, well-controlled 02/17/2007  . Dyspnea on exertion 02/17/2007  . SYMPTOM, INCONTINENCE, URGE 02/17/2007    Past Surgical History:  Procedure Laterality Date  .  CHOLECYSTECTOMY  09/14/2011   Procedure: LAPAROSCOPIC CHOLECYSTECTOMY;  Surgeon: Odis Hollingshead, MD;  Location: [facility];  Service: General;  Laterality: N/A;  attempted intraoperative cholangiogram   . L TKR    . LIVER BIOPSY  09/14/2011   Procedure: LIVER BIOPSY;  Surgeon: Odis Hollingshead, MD;  Location: [facility];  Service: General;  Laterality: N/A;  . OTHER SURGICAL HISTORY     left leg surgery   . OTHER SURGICAL HISTORY      left breast surgery due to mastitis   . right left knee arthroplast      Current Outpatient Rx  . Order #: [id number] Class: Historical Med  . Order #: [id number] Class: Historical Med  . Order #: [id number] Class: Historical Med  . Order #: [id number] Class: Print  . Order #: [id number] Class: Normal  . Order #: [id number] Class: Normal  . Order #: [id number] Class: Historical Med  . Order #: [id number] Class: Historical Med  . Order #: [id number] Class: Normal  . Order #: [id number] Class: Historical Med  . Order #: [id number] Class: Normal  . Order #: [id number] Class: Print    Allergies Atorvastatin; Enalapril maleate; Hctz [hydrochlorothiazide]; Lovastatin; Simvastatin; and Topamax [topiramate]  Family History  Problem Relation Age of Onset  . Hypertension Mother   . Arthritis Mother   . Diabetes Mother   . Stroke Mother   . Stroke Father   . Hypertension Father   . Hypertension Other   . CAD Neg Hx     Social History Social History  Substance Use Topics  . Smoking status: Former Smoker    Packs/day: 0.50    Years: 35.00    Types: Cigarettes    Quit date: 05/24/2001  . Smokeless tobacco: Never Used  . Alcohol use Yes     Comment: socially     Review of Systems  Constitutional: No fever/chills; lightheadedness on standing.  Eyes: No visual changes. ENT: No sore throat. Cardiovascular: Denies chest pain; intermittent heart palpitations.  Respiratory: Denies shortness of breath. Gastrointestinal: No abdominal pain.  No nausea, no vomiting.  No diarrhea.  No constipation. Genitourinary: Negative for dysuria. Musculoskeletal: Negative for back pain. Skin: Negative for rash. Neurological: Negative for headaches, focal weakness or numbness.  10-point ROS otherwise negative.  ____________________________________________   PHYSICAL EXAM:  VITAL SIGNS: ED Triage Vitals  Enc Vitals Group     BP 01/18/16 1411 142/75     Pulse Rate 01/18/16 1411 80     Resp 01/18/16 1411 18      Temp 01/18/16 1411 98.3 F (36.8 C)     Temp Source 01/18/16 1411 Oral     SpO2 01/18/16 1342 95 %    Constitutional: Alert and oriented. Well appearing and in no acute distress. Eyes: Conjunctivae are normal.  Head: Atraumatic. Nose: No congestion/rhinnorhea. Mouth/Throat: Mucous membranes are somewhat dry.  Oropharynx non-erythematous. Neck: No stridor.   Cardiovascular: Normal rate, regular rhythm. Good peripheral circulation. Grossly normal heart sounds.   Respiratory: Normal respiratory effort.  No retractions. Lungs CTAB. Gastrointestinal: Soft and nontender. No distention.  Musculoskeletal: No lower extremity tenderness nor edema. No gross deformities of extremities. No pronator drift.  Neurologic:  Normal speech and language. No gross focal neurologic deficits are appreciated.  Skin:  Skin is warm, dry and intact. No rash noted. Psychiatric: Mood and affect are normal. Speech and behavior are normal.  ____________________________________________   LABS (all labs ordered are listed, but only abnormal results are displayed)  Labs Reviewed  CBC WITH DIFFERENTIAL/PLATELET - Abnormal; Notable for the following:  Result Value   Platelets 147 (*)    All other components within normal limits  BASIC METABOLIC PANEL - Abnormal; Notable for the following:    Glucose, Bld 110 (*)    Calcium 8.5 (*)    GFR calc non Af Amer 57 (*)    All other components within normal limits  I-STAT TROPOININ, ED   ____________________________________________  EKG   EKG Interpretation  Date/Time:  Sunday January 18 2016 14:43:38 EDT Ventricular Rate:  77 PR Interval:    QRS Duration: 88 QT Interval:  398 QTC Calculation: 451 R Axis:   4 Text Interpretation:  Sinus rhythm Abnormal R-wave progression, early transition No STEMI.  Confirmed by LONG MD, JOSHUA [phone]) on 01/18/2016 2:53:33 PM       ____________________________________________  RADIOLOGY  Dg Chest 2 View  Result  Date: 01/18/2016 CLINICAL DATA:  Near syncope.  COPD. EXAM: CHEST  2 VIEW COMPARISON:  09/06/2011 FINDINGS: Heart and mediastinal contours are within normal limits. No focal opacities or effusions. No acute bony abnormality. Degenerative changes in the thoracic spine. IMPRESSION: No active cardiopulmonary disease. Electronically Signed   By: Rolm Baptise M.D.   On: 01/18/2016 15:13    ____________________________________________   PROCEDURES  Procedure(s) performed:   Procedures  None ____________________________________________   INITIAL IMPRESSION / ASSESSMENT AND PLAN / ED COURSE  Pertinent labs & imaging results that were available during my care of the patient were reviewed by me and considered in my medical decision making (see chart for details).  Patient resents serous department for evaluation of lightheadedness on standing and low blood pressures at home. No clear contributing factor such as medication or poor PO intake to explain the patient's symptoms. Plan for baseline labs, EKG, chest x-ray. No history of echocardiogram in my review of Epic. Daughter states the patient has not had this study as of yet. Patient is in sinus rhythm on the monitor.   04:18 PM Labs resulted with no obvious abnormality. Patient is orthostatic here. Plan for admission. Will page the hospitalist for discuss.   04:22 PM Spoke with hospitalist and placed temporary admission orders for obs/telemetry. Updated patient and family.  ____________________________________________  FINAL CLINICAL IMPRESSION(S) / ED DIAGNOSES  Final diagnoses:  Near syncope  Orthostatic hypotension     MEDICATIONS GIVEN DURING THIS VISIT:  Medications  sodium chloride 0.9 % bolus 500 mL (500 mLs Intravenous New Bag/Given 01/18/16 1607)     NEW OUTPATIENT MEDICATIONS STARTED DURING THIS VISIT:  None   Note:  This document was prepared using Dragon voice recognition software and may include unintentional  dictation errors.  Nanda Quinton, MD Emergency Medicine   Margette Fast, MD 01/18/16 [phone]

## 2016-01-18 NOTE — ED Notes (Signed)
MD at bedside. 

## 2016-01-18 NOTE — ED Triage Notes (Signed)
Pt BIB EMS and speaks [place].  Pt coming from home and presents with dizziness.  Pt's daughter is a physical therapist and did orthostatics on the pt.  When pt stood up, pt's systolic BP dropped to the 90's. On EMS arrival, EMS started an IV and gave pt 565ml of NaCl.  Pt a/o x 4 and normally ambulatory with a walker and cane.

## 2016-01-18 NOTE — H&P (Signed)
History and Physical    Sabrina Page [name] DOB: [birth year] DOA: 01/18/2016  PCP: [name], MD  Patient coming from: home  Chief Complaint: Weakness, dizziness, hypotension  HPI: Sabrina Page is a 82 y.o. female with medical history significant of COPD, obesity, dizziness, hyperlipidemia, hypertension, presents to the emergency room with complaints of weakness and dizziness. Patient speaks only [place] And interpretation provided by patient's daughter, who is a physical therapist for home health agency. She basically tells me that every time the patient stands she feels lightheaded and she has several presyncopal episodes including several falls in the last year. Her symptoms are worst especially in the morning and in the first part of the day, and as the day progresses she feels better. She has a history of hypertension, however is not taking any antihypertensives currently. She had some lower extremity swelling in the past for which she was on a diuretic, however is not taking that either. Today her symptoms were a lot worse than normal, and her daughter did orthostatic vital signs and patient's blood pressure worse in the 60s when standing, and she was quite symptomatic. Patient denies any chest pain currently. She reports intermittent episodes of "a sensation in her throat", like a pressure, when she exerts herself. She also states that occasionally she wakes up in the middle of the night short of breath and needs to sit up. She has no fever or chills, denies any cough or chest congestion. She does complain of increased frequency in the last several days, denies any burning with urination.  ED Course: In the emergency room, she is orthostatic by vital signs and symptomatic, blood work is fairly unremarkable, troponin is negative, she had a chest x-ray which shows COPD but no acute findings. TRH was asked for admission for symptomatic  orthostasis.  Review of Systems: As per HPI otherwise 10 point review of systems negative.   Past Medical History:  Diagnosis Date  . Allergy    rhinitis  . Asthma   . Chronic kidney disease    hx of cystitis   . COPD (chronic obstructive pulmonary disease) ([place])   . Depression   . Dizziness    vertigo   . Gallstones   . GERD (gastroesophageal reflux disease)   . Hyperlipidemia   . Hypertension   . Obesity   . Osteoarthritis   . Osteoporosis   . Peripheral vascular disease ([facility])    swelling   . Pneumonia   . Shortness of breath    with exertion   . Shoulder injury    left  . Sleep apnea    never diagnosed   . Urinary incontinence     Past Surgical History:  Procedure Laterality Date  . CHOLECYSTECTOMY  09/14/2011   Procedure: LAPAROSCOPIC CHOLECYSTECTOMY;  Surgeon: Odis Hollingshead, MD;  Location: [facility];  Service: General;  Laterality: N/A;  attempted intraoperative cholangiogram   . L TKR    . LIVER BIOPSY  09/14/2011   Procedure: LIVER BIOPSY;  Surgeon: Odis Hollingshead, MD;  Location: [facility];  Service: General;  Laterality: N/A;  . OTHER SURGICAL HISTORY     left leg surgery   . OTHER SURGICAL HISTORY     left breast surgery due to mastitis   . right left knee arthroplast       reports that she quit smoking about 14 years ago. Her smoking use included Cigarettes. She has a 17.50 pack-year smoking history. She has never used smokeless  tobacco. She reports that she drinks alcohol. She reports that she does not use drugs.  Allergies  Allergen Reactions  . Atorvastatin     REACTION: upset stomach  . Enalapril Maleate   . Hctz [Hydrochlorothiazide]     Dizzy   . Lovastatin     REACTION: tongue stress  . Simvastatin     REACTION: mouth sores  . Topamax [Topiramate]     syncope    Family History  Problem Relation Age of Onset  . Hypertension Mother   . Arthritis Mother   . Diabetes Mother   . Stroke Mother   . Stroke Father   . Hypertension Father    . Hypertension Other   . CAD Neg Hx     Prior to Admission medications   Medication Sig Start Date End Date Taking? Authorizing Provider  aspirin 81 MG tablet Take 81 mg by mouth every morning.    Yes Historical Provider, MD  B Complex-C (SUPER B COMPLEX PO) Take 1 tablet by mouth every morning.    Yes Historical Provider, MD  Cholecalciferol 1000 UNITS capsule Take 1,000 Units by mouth every morning.    Yes Historical Provider, MD  clonazePAM (KLONOPIN) 0.5 MG tablet TAKE 1 TABLET BY MOUTH TWICE A DAY AS NEEDED FOR ANXIETY/INOMNIA Patient taking differently: Take 0.5 mg by mouth at bedtime as needed for anxiety (sleep).  12/10/15  Yes Evie Lacks Plotnikov, MD  FLUoxetine (PROZAC) 20 MG tablet Take 1 tablet (20 mg total) by mouth daily. 03/25/15  Yes Evie Lacks Plotnikov, MD  Fluticasone Furoate-Vilanterol (BREO ELLIPTA) 200-25 MCG/INH AEPB Inhale 1 puff into the lungs daily. Patient taking differently: Inhale 1 puff into the lungs daily as needed (shortness of breath/ sleep apnea).  12/26/14  Yes Janith Lima, MD  hydroxypropyl methylcellulose / hypromellose (ISOPTO TEARS / GONIOVISC) 2.5 % ophthalmic solution Place 1 drop into both eyes daily as needed for dry eyes.   Yes Historical Provider, MD  KLOR-CON 8 MEQ tablet Take 8 mEq by mouth daily. 01/06/16  Yes Historical Provider, MD  loratadine (CLARITIN) 10 MG tablet Take 1 tablet (10 mg total) by mouth every morning. 06/26/15  Yes Evie Lacks Plotnikov, MD  Omega-3 Fatty Acids (FISH OIL) 1000 MG CAPS Take 1 capsule by mouth every morning.    Yes Historical Provider, MD  omeprazole (PRILOSEC) 20 MG capsule Take 2 capsules (40 mg total) by mouth daily. Patient taking differently: Take 20 mg by mouth daily.  01/06/15  Yes Evie Lacks Plotnikov, MD  traMADol (ULTRAM) 50 MG tablet Take 0.5-1 tablets (25-50 mg total) by mouth every 6 (six) hours as needed. Patient taking differently: Take 50 mg by mouth 2 (two) times daily.  09/23/15  Yes Cassandria Anger,  MD    Physical Exam: Vitals:   01/18/16 1342 01/18/16 1411  BP:  142/75  Pulse:  80  Resp:  18  Temp:  98.3 F (36.8 C)  TempSrc:  Oral  SpO2: 95% 100%      Constitutional: NAD Vitals:   01/18/16 1342 01/18/16 1411  BP:  142/75  Pulse:  80  Resp:  18  Temp:  98.3 F (36.8 C)  TempSrc:  Oral  SpO2: 95% 100%   Eyes: PERRL, lids and conjunctivae normal ENMT: Mucous membranes are moist. Posterior pharynx clear of any exudate or lesions. Wears dentures. Neck: normal, supple Respiratory: clear to auscultation bilaterally, no wheezing, no crackles. Normal respiratory effort. No accessory muscle use.  Cardiovascular: Regular rate  and rhythm, no murmurs / rubs / gallops. Trace extremity edema. 2+ pedal pulses.  Abdomen: no tenderness, no masses palpated. Bowel sounds positive.  Musculoskeletal: no clubbing / cyanosis. Normal muscle tone.  Skin: no rashes, lesions, ulcers. No induration Neurologic: CN 2-12 grossly intact. Strength 5/5 in all 4.  Psychiatric: Normal judgment and insight. Alert and oriented x 3. Normal mood.   Labs on Admission: I have personally reviewed following labs and imaging studies  CBC:  Recent Labs Lab 01/18/16 1524  WBC 4.7  NEUTROABS 2.4  HGB 13.7  HCT 43.3  MCV 91.7  PLT [id number]*   Basic Metabolic Panel:  Recent Labs Lab 01/18/16 1524  NA 137  K 3.9  CL 104  CO2 28  GLUCOSE 110*  BUN 16  CREATININE 0.92  CALCIUM 8.5*   GFR: CrCl cannot be calculated (Unknown ideal weight.). Liver Function Tests: No results for input(s): AST, ALT, ALKPHOS, BILITOT, PROT, ALBUMIN in the last 168 hours. No results for input(s): LIPASE, AMYLASE in the last 168 hours. No results for input(s): AMMONIA in the last 168 hours. Coagulation Profile: No results for input(s): INR, PROTIME in the last 168 hours. Cardiac Enzymes: No results for input(s): CKTOTAL, CKMB, CKMBINDEX, TROPONINI in the last 168 hours. BNP (last 3 results) No results for input(s):  PROBNP in the last 8760 hours. HbA1C: No results for input(s): HGBA1C in the last 72 hours. CBG: No results for input(s): GLUCAP in the last 168 hours. Lipid Profile: No results for input(s): CHOL, HDL, LDLCALC, TRIG, CHOLHDL, LDLDIRECT in the last 72 hours. Thyroid Function Tests: No results for input(s): TSH, T4TOTAL, FREET4, T3FREE, THYROIDAB in the last 72 hours. Anemia Panel: No results for input(s): VITAMINB12, FOLATE, FERRITIN, TIBC, IRON, RETICCTPCT in the last 72 hours. Urine analysis:    Component Value Date/Time   COLORURINE YELLOW 06/26/2015 0930   APPEARANCEUR CLEAR 06/26/2015 0930   LABSPEC 1.020 06/26/2015 0930   PHURINE 5.5 06/26/2015 0930   GLUCOSEU NEGATIVE 06/26/2015 0930   HGBUR NEGATIVE 06/26/2015 0930   BILIRUBINUR NEGATIVE 06/26/2015 0930   KETONESUR NEGATIVE 06/26/2015 0930   UROBILINOGEN 0.2 06/26/2015 0930   NITRITE NEGATIVE 06/26/2015 0930   LEUKOCYTESUR NEGATIVE 06/26/2015 0930   Sepsis Labs: @LABRCNTIP (procalcitonin:4,lacticidven:4) )No results found for this or any previous visit (from the past 240 hour(s)).   Radiological Exams on Admission: Dg Chest 2 View  Result Date: 01/18/2016 CLINICAL DATA:  Near syncope.  COPD. EXAM: CHEST  2 VIEW COMPARISON:  09/06/2011 FINDINGS: Heart and mediastinal contours are within normal limits. No focal opacities or effusions. No acute bony abnormality. Degenerative changes in the thoracic spine. IMPRESSION: No active cardiopulmonary disease. Electronically Signed   By: Rolm Baptise M.D.   On: 01/18/2016 15:13    EKG: Independently reviewed. Sinus rhythm  Assessment/Plan Active Problems:   Venous (peripheral) insufficiency   Asthma, moderate persistent, well-controlled   DIZZINESS   Dyspnea on exertion   Unspecified chest pain   Anxiety state   OSA on CPAP   Fall at home   Near syncope   Orthostatic hypotension    Orthostatic hypotension - Patient is not on any diuretics and not on any  antihypertensives currently, she does not appear dehydrated. She received 1 L normal saline in the emergency room, will not continue fluids overnight since she does not appear dry. - We'll check a TSH, check cortisol given persistent hypotension looks like daily - Obtain a 2-D echocardiogram, monitor patient on telemetry overnight - Likely has a degree  of venous insufficiency, place TED hoses - ? Whether she would benefit from Florinef, but we'll see what the echo shows - she recently had carotid US in March 2017, will not repeat  Results Soft plaque, bilaterally. 1-39% bilateral ICA stenosis. Normal subclavian arteries, bilaterally. Patent vertebral arteries with antegrade flow.   Dyspnea with exertion/orthopnea - We'll obtain 2-D echo as above  Asthma/COPD - Continue home medications  Recurrent Falls at home - Obtain PT evaluation  Obstructive sleep apnea - Continue CPAP  Anxiety - Continue home medications    DVT prophylaxis: Lovenox  Code Status: Full code  Family Communication: Discussed with daughter at bedside  Disposition Plan: Admit to telemetry  Consults called: None  Admission status: Observation    Marzetta Board, MD [facility] Pager [phone]  If 7PM-7AM, please contact night-coverage [email or web address] [facility]  01/18/2016, 4:55 PM

## 2016-01-18 NOTE — ED Notes (Addendum)
Pt's daughter translates that the pt has been having these dizzy spells every weekend for the past year. Daughter states this happens ever time she goes to get the patient out of the car. Daughter reports the patient felt dizzy and fell this date. Daughter reports the patient has low blood pressure when she awakens in the a.m. Daughter reports EMS did orthostatic vital signs with positive changes. Patient denies chest pain but does feel as though her heart is racing at times. Same does go away. Pt resting comfortably at this time talking with MD.

## 2016-01-19 ENCOUNTER — Telehealth: Payer: Self-pay | Admitting: Internal Medicine

## 2016-01-19 ENCOUNTER — Observation Stay (HOSPITAL_BASED_OUTPATIENT_CLINIC_OR_DEPARTMENT_OTHER): Payer: Medicare Other

## 2016-01-19 DIAGNOSIS — I951 Orthostatic hypotension: Secondary | ICD-10-CM | POA: Diagnosis not present

## 2016-01-19 DIAGNOSIS — W19XXXD Unspecified fall, subsequent encounter: Secondary | ICD-10-CM

## 2016-01-19 DIAGNOSIS — R0600 Dyspnea, unspecified: Secondary | ICD-10-CM | POA: Diagnosis not present

## 2016-01-19 DIAGNOSIS — R42 Dizziness and giddiness: Secondary | ICD-10-CM

## 2016-01-19 LAB — CBC
HCT: 43.2 % (ref 36.0–46.0)
Hemoglobin: 13.7 g/dL (ref 12.0–15.0)
MCH: 29 pg (ref 26.0–34.0)
MCHC: 31.7 g/dL (ref 30.0–36.0)
MCV: 91.5 fL (ref 78.0–100.0)
Platelets: 151 10*3/uL (ref 150–400)
RBC: 4.72 MIL/uL (ref 3.87–5.11)
RDW: 13.1 % (ref 11.5–15.5)
WBC: 4.1 10*3/uL (ref 4.0–10.5)

## 2016-01-19 LAB — BASIC METABOLIC PANEL
Anion gap: 5 (ref 5–15)
BUN: 14 mg/dL (ref 6–20)
CO2: 30 mmol/L (ref 22–32)
Calcium: 8.6 mg/dL — ABNORMAL LOW (ref 8.9–10.3)
Chloride: 107 mmol/L (ref 101–111)
Creatinine, Ser: 0.95 mg/dL (ref 0.44–1.00)
GFR calc Af Amer: >60 mL/min (ref >60)
GFR calc non Af Amer: 55 mL/min — ABNORMAL LOW (ref >60)
Glucose, Bld: 96 mg/dL (ref 65–99)
Potassium: 4 mmol/L (ref 3.5–5.1)
Sodium: 142 mmol/L (ref 135–145)

## 2016-01-19 LAB — ECHOCARDIOGRAM COMPLETE
E decel time: 254 msec
E/e' ratio: 8.34
FS: 31 % (ref 28–44)
Height: 64 in
IVS/LV PW RATIO, ED: 0.9
LA ID, A-P, ES: 39 mm
LA diam end sys: 39 mm
LA diam index: 1.78 cm/m2
LA vol A4C: 66.7 ml
LA vol index: 32.1 mL/m2
LA vol: 70.4 mL
LV E/e' medial: 8.34
LV E/e'average: 8.34
LV PW d: 14.5 mm — AB (ref 0.6–1.1)
LV e' LATERAL: 9.25 cm/s
LVOT SV: 45 mL
LVOT VTI: 14.4 cm
LVOT area: 3.14 cm2
LVOT diameter: 20 mm
LVOT peak vel: 65 cm/s
MV Dec: 254
MV Peak grad: 2 mmHg
MV pk A vel: 50 m/s
MV pk E vel: 77.1 m/s
Reg peak vel: 248 cm/s
TAPSE: 28.2 mm
TDI e' lateral: 9.25
TDI e' medial: 7.94
TR max vel: 248 cm/s
Weight: 3588.8 oz

## 2016-01-19 LAB — CORTISOL: Cortisol, Plasma: 4.4 ug/dL

## 2016-01-19 LAB — TSH: TSH: 2.703 u[IU]/mL (ref 0.350–4.500)

## 2016-01-19 MED ORDER — FLUOXETINE HCL 20 MG PO CAPS
20.0000 mg | ORAL_CAPSULE | Freq: Every day | ORAL | Status: DC
  Administered 2016-01-19: 20 mg via ORAL
  Filled 2016-01-19: qty 1

## 2016-01-19 MED ORDER — ASPIRIN EC 81 MG PO TBEC
81.0000 mg | DELAYED_RELEASE_TABLET | Freq: Every morning | ORAL | Status: DC
  Administered 2016-01-19: 81 mg via ORAL
  Filled 2016-01-19: qty 1

## 2016-01-19 MED ORDER — HYPROMELLOSE (GONIOSCOPIC) 2.5 % OP SOLN
1.0000 [drp] | Freq: Every day | OPHTHALMIC | Status: DC | PRN
  Filled 2016-01-19: qty 15

## 2016-01-19 MED ORDER — MIDODRINE HCL 5 MG PO TABS: 5.0000 mg | ORAL_TABLET | Freq: Two times a day (BID) | ORAL | 1 refills | Status: DC

## 2016-01-19 MED ORDER — HYPROMELLOSE (GONIOSCOPIC) 2.5 % OP SOLN: 1.0000 [drp] | Freq: Every day | OPHTHALMIC | Status: DC | PRN

## 2016-01-19 MED ORDER — FLUOXETINE HCL 20 MG PO TABS
20.0000 mg | ORAL_TABLET | Freq: Every day | ORAL | Status: DC
  Filled 2016-01-19: qty 1

## 2016-01-19 MED ORDER — FLUTICASONE FUROATE-VILANTEROL 200-25 MCG/INH IN AEPB
1.0000 | INHALATION_SPRAY | Freq: Every day | RESPIRATORY_TRACT | Status: DC
  Administered 2016-01-19: 1 via RESPIRATORY_TRACT
  Filled 2016-01-19: qty 28

## 2016-01-19 MED ORDER — LORATADINE 10 MG PO TABS
10.0000 mg | ORAL_TABLET | Freq: Every morning | ORAL | Status: DC
  Administered 2016-01-19: 10 mg via ORAL
  Filled 2016-01-19: qty 1

## 2016-01-19 MED ORDER — MIDODRINE HCL 5 MG PO TABS
5.0000 mg | ORAL_TABLET | Freq: Two times a day (BID) | ORAL | Status: DC
  Administered 2016-01-19: 5 mg via ORAL
  Filled 2016-01-19 (×2): qty 1

## 2016-01-19 MED ORDER — POLYVINYL ALCOHOL 1.4 % OP SOLN
1.0000 [drp] | Freq: Every day | OPHTHALMIC | Status: DC | PRN
  Filled 2016-01-19: qty 15

## 2016-01-19 MED ORDER — CLONAZEPAM 0.5 MG PO TABS: 0.5000 mg | ORAL_TABLET | Freq: Every evening | ORAL | Status: DC | PRN

## 2016-01-19 MED ORDER — PANTOPRAZOLE SODIUM 40 MG PO TBEC
40.0000 mg | DELAYED_RELEASE_TABLET | Freq: Every day | ORAL | Status: DC
  Administered 2016-01-19: 40 mg via ORAL
  Filled 2016-01-19: qty 1

## 2016-01-19 MED ORDER — TRAMADOL HCL 50 MG PO TABS: 25.0000 mg | ORAL_TABLET | Freq: Four times a day (QID) | ORAL | Status: DC | PRN

## 2016-01-19 NOTE — Evaluation (Signed)
Physical Therapy Evaluation Patient Details Name: Sabrina Page MRN: [id number] DOB: [birth year] Today's Date: 01/19/2016   History of Present Illness  admitted 8/27- Sabrina Page is a 82 y.o. female with medical history significant of COPD, obesity, dizziness, hyperlipidemia, hypertension, presents to the emergency room with complaints of weakness and dizziness  Clinical Impression  The patient experienced drop in BP standing up and unable to ambulate at this visit. Daughter present to interpret and to provide info. The patient lives with spouse who has some dementia per daughter, has a few hours of a caregiver in AM. DC plan is for home, patient may be able to stay at the daughter's home. Pt admitted with above diagnosis. Pt currently with functional limitations due to the deficits listed below (see PT Problem List).  Pt will benefit from skilled PT to increase their independence and safety with mobility to allow discharge to the venue listed below.       Follow Up Recommendations Home health PT;Supervision/Assistance - 24 hour    Equipment Recommendations  None recommended by PT    Recommendations for Other Services       Precautions / Restrictions Precautions Precautions: Fall Precaution Comments: orthostatic      Mobility  Bed Mobility               General bed mobility comments: in recliner  Transfers Overall transfer level: Needs assistance Equipment used: Rolling walker (2 wheeled) Transfers: Sit to/from Stand Sit to Stand: Min assist         General transfer comment: stands with min guard, while standing noted that the trunk started swaying, the patient shaking head and leaning backward.  stood only long enough for 1 BP to be taken. pre BP 90/54, after standing 70/47.   Ambulation/Gait                Stairs            Wheelchair Mobility    Modified Rankin (Stroke Patients Only)       Balance Overall balance assessment:  History of Falls;Needs assistance Sitting-balance support: Feet supported;No upper extremity supported Sitting balance-Leahy Scale: Good     Standing balance support: During functional activity;Bilateral upper extremity supported Standing balance-Leahy Scale: Poor Standing balance comment: due to dizziness                             Pertinent Vitals/Pain Pain Assessment: No/denies pain    Home Living Family/patient expects to be discharged to:: Private residence Living Arrangements: Spouse/significant other Available Help at Discharge: Personal care attendant (several hours in the AM.) Type of Home: Apartment Home Access: Level entry;Elevator     Home Layout: One level Home Equipment: Environmental consultant - 4 wheels Additional Comments: per daughter, ambulates w/ RW, spouse has dementia    Prior Function Level of Independence: Independent with assistive device(s)               Hand Dominance        Extremity/Trunk Assessment   Upper Extremity Assessment: Generalized weakness           Lower Extremity Assessment: Generalized weakness         Communication   Communication: Prefers language other than English  Cognition Arousal/Alertness: Awake/alert Behavior During Therapy: WFL for tasks assessed/performed Overall Cognitive Status: Within Functional Limits for tasks assessed  General Comments      Exercises        Assessment/Plan    PT Assessment Patient needs continued PT services  PT Diagnosis Difficulty walking   PT Problem List Decreased strength;Decreased activity tolerance;Decreased balance;Decreased mobility;Cardiopulmonary status limiting activity  PT Treatment Interventions DME instruction;Gait training;Functional mobility training;Therapeutic activities;Patient/family education   PT Goals (Current goals can be found in the Care Plan section) Acute Rehab PT Goals Patient Stated Goal: to walk PT Goal  Formulation: With patient/family Time For Goal Achievement: 02/02/16 Potential to Achieve Goals: Good    Frequency Min 3X/week   Barriers to discharge Decreased caregiver support      Co-evaluation               End of Session   Activity Tolerance: Treatment limited secondary to medical complications (Comment) Patient left: in chair;with call bell/phone within reach;with family/visitor present Nurse Communication: Mobility status    Functional Assessment Tool Used: clinical judgement Functional Limitation: Mobility: Walking and moving around Mobility: Walking and Moving Around Current Status [id number]): 100 percent impaired, limited or restricted Mobility: Walking and Moving Around Goal Status [id number]): At least 1 percent but less than 20 percent impaired, limited or restricted    Time: [id number] PT Time Calculation (min) (ACUTE ONLY): 33 min   Charges:   PT Evaluation $PT Eval Low Complexity: 1 Procedure PT Treatments $Therapeutic Activity: 8-22 mins   PT G Codes:   PT G-Codes **NOT FOR INPATIENT CLASS** Functional Assessment Tool Used: clinical judgement Functional Limitation: Mobility: Walking and moving around Mobility: Walking and Moving Around Current Status [id number]): 100 percent impaired, limited or restricted Mobility: Walking and Moving Around Goal Status [id number]): At least 1 percent but less than 20 percent impaired, limited or restricted    Claretha Cooper 01/19/2016, 2:41 PM

## 2016-01-19 NOTE — Discharge Instructions (Signed)

## 2016-01-19 NOTE — Progress Notes (Signed)
Pt selected [facility] for HHRN/PT, referral given to in house rep.

## 2016-01-19 NOTE — Care Management Obs Status (Signed)
[facility] NOTIFICATION   Patient Details  Name: Sabrina Page MRN: [id number] Date of Birth: Aug 01, 1933   Medicare Observation Status Notification Given:       Purcell Mouton, RN 01/19/2016, 12:32 PM

## 2016-01-19 NOTE — Progress Notes (Signed)
  Echocardiogram 2D Echocardiogram has been performed.  Sabrina Page 01/19/2016, 10:11 AM

## 2016-01-19 NOTE — Telephone Encounter (Signed)
Has received home health orders from [facility].  Would like to know if Dr. Alain Marion will be her overseeing MD.

## 2016-01-19 NOTE — Progress Notes (Signed)
Pt called back, asked to change to [facility]. Request called to [facility] and faxed [phone].

## 2016-01-19 NOTE — Discharge Summary (Signed)
Physician Discharge Summary  Sabrina Page [name] DOB: Dec 01, 1933 DOA: 01/18/2016  PCP: [name], MD  Admit date: 01/18/2016 Discharge date: 01/19/2016  Recommendations for Outpatient Follow-up:  1. Pt will need to follow up with PCP in 2-3 weeks post discharge 2. Please obtain BMP to evaluate electrolytes and kidney function 3. Please also check CBC to evaluate Hg and Hct levels   Discharge Diagnoses:  Active Problems:   Orthostatic hypotension  Discharge Condition: Stable  Diet recommendation: Heart healthy diet discussed in details   History of present illness:  82 y.o. female with medical history significant of COPD, obesity, dizziness, hyperlipidemia, hypertension, presents to the emergency room with complaints of weakness and dizziness. Patient speaks only [place] And interpretation provided by patient's daughter, who is a physical therapist for home health agency. She basically says  that every time the patient stands she feels lightheaded and she has several presyncopal episodes including several falls in the last year.  Hospital Course:  Orthostatic hypotension - Patient is not on any diuretics and not on any antihypertensives currently, she did not appear dehydration - vital signs conform orthostatic hypotension - ECHO with stable EF - spoke with cardiology team on call, recommendation is to try midodrine, abdominal binder  - PT requested and HH PT recommended, order placed - daughter wants to take pt home   Dyspnea with exertion/orthopnea - resolved - ECHO with normal EF   Asthma/COPD - Continue home medications  Recurrent Falls at home - [facility] PT recommended after PT eval and order placed   Obstructive sleep apnea - Continue CPAP  Obesity  - Body mass index is 38.5 kg/m.   Procedures/Studies: Dg Chest 2 View  Result Date: 01/18/2016 CLINICAL DATA:  Near syncope.  COPD. EXAM: CHEST  2 VIEW COMPARISON:  09/06/2011  FINDINGS: Heart and mediastinal contours are within normal limits. No focal opacities or effusions. No acute bony abnormality. Degenerative changes in the thoracic spine. IMPRESSION: No active cardiopulmonary disease. Electronically Signed   By: Rolm Baptise M.D.   On: 01/18/2016 15:13    Discharge Exam: Vitals:   01/18/16 2210 01/19/16 0557  BP:  139/78  Pulse: 63 67  Resp: 18 16  Temp:  97.7 F (36.5 C)   Vitals:   01/18/16 2115 01/18/16 2210 01/19/16 0557 01/19/16 1030  BP: 123/63  139/78   Pulse: 62 63 67   Resp: 17 18 16    Temp: 97.7 F (36.5 C)  97.7 F (36.5 C)   TempSrc: Oral  Oral   SpO2: 97% 95% 98% 94%  Weight:      Height:        General: Pt is alert, follows commands appropriately, not in acute distress Cardiovascular: Regular rate and rhythm, no rubs, no gallops Respiratory: Clear to auscultation bilaterally, no wheezing, no crackles, no rhonchi Abdominal: Soft, non tender, non distended, bowel sounds +, no guarding  Discharge Instructions     Medication List    TAKE these medications   aspirin 81 MG tablet Take 81 mg by mouth every morning.   Cholecalciferol 1000 units capsule Take 1,000 Units by mouth every morning.   clonazePAM 0.5 MG tablet Commonly known as:  KLONOPIN TAKE 1 TABLET BY MOUTH TWICE A DAY AS NEEDED FOR ANXIETY/INOMNIA What changed:  how much to take  how to take this  when to take this  reasons to take this  additional instructions   Fish Oil 1000 MG Caps Take 1 capsule by mouth every  morning.   FLUoxetine 20 MG tablet Commonly known as:  PROZAC Take 1 tablet (20 mg total) by mouth daily.   fluticasone furoate-vilanterol 200-25 MCG/INH Aepb Commonly known as:  BREO ELLIPTA Inhale 1 puff into the lungs daily. What changed:  when to take this  reasons to take this   hydroxypropyl methylcellulose / hypromellose 2.5 % ophthalmic solution Commonly known as:  ISOPTO TEARS / GONIOVISC Place 1 drop into both eyes  daily as needed for dry eyes.   KLOR-CON 8 MEQ tablet Generic drug:  potassium chloride Take 8 mEq by mouth daily.   loratadine 10 MG tablet Commonly known as:  CLARITIN Take 1 tablet (10 mg total) by mouth every morning.   midodrine 5 MG tablet Commonly known as:  PROAMATINE Take 1 tablet (5 mg total) by mouth 2 (two) times daily with a meal.   omeprazole 20 MG capsule Commonly known as:  PRILOSEC Take 2 capsules (40 mg total) by mouth daily. What changed:  how much to take   SUPER B COMPLEX PO Take 1 tablet by mouth every morning.   traMADol 50 MG tablet Commonly known as:  ULTRAM Take 0.5-1 tablets (25-50 mg total) by mouth every 6 (six) hours as needed. What changed:  how much to take  when to take this      [place], MD .   Specialty:  Internal Medicine Contact information: [place] [address] [phone]            The results of significant diagnostics from this hospitalization (including imaging, microbiology, ancillary and laboratory) are listed below for reference.     Microbiology: No results found for this or any previous visit (from the past 240 hour(s)).   Labs: Basic Metabolic Panel:  Recent Labs Lab 01/18/16 1524 01/19/16 0416  NA 137 142  K 3.9 4.0  CL 104 107  CO2 28 30  GLUCOSE 110* 96  BUN 16 14  CREATININE 0.92 0.95  CALCIUM 8.5* 8.6*   CBC:  Recent Labs Lab 01/18/16 1524 01/19/16 0416  WBC 4.7 4.1  NEUTROABS 2.4  --   HGB 13.7 13.7  HCT 43.3 43.2  MCV 91.7 91.5  PLT 147* 151    SIGNED: Time coordinating discharge:  30 minutes  MAGICK-MYERS, [name], MD  [facility] 01/19/2016, 11:09 AM Pager [phone]  If 7PM-7AM, please contact night-coverage [email or web address] Password [email or web address]

## 2016-01-20 NOTE — Telephone Encounter (Signed)
Charlene informed of below.

## 2016-01-20 NOTE — Telephone Encounter (Signed)
OK. Thx

## 2016-01-22 DIAGNOSIS — I951 Orthostatic hypotension: Secondary | ICD-10-CM | POA: Diagnosis not present

## 2016-01-22 DIAGNOSIS — W19XXXD Unspecified fall, subsequent encounter: Secondary | ICD-10-CM | POA: Diagnosis not present

## 2016-01-22 DIAGNOSIS — J449 Chronic obstructive pulmonary disease, unspecified: Secondary | ICD-10-CM | POA: Diagnosis not present

## 2016-01-22 DIAGNOSIS — F329 Major depressive disorder, single episode, unspecified: Secondary | ICD-10-CM | POA: Diagnosis not present

## 2016-01-22 DIAGNOSIS — Z7982 Long term (current) use of aspirin: Secondary | ICD-10-CM | POA: Diagnosis not present

## 2016-01-22 DIAGNOSIS — I872 Venous insufficiency (chronic) (peripheral): Secondary | ICD-10-CM | POA: Diagnosis not present

## 2016-01-22 DIAGNOSIS — E785 Hyperlipidemia, unspecified: Secondary | ICD-10-CM | POA: Diagnosis not present

## 2016-01-22 DIAGNOSIS — E669 Obesity, unspecified: Secondary | ICD-10-CM | POA: Diagnosis not present

## 2016-01-22 DIAGNOSIS — Z9181 History of falling: Secondary | ICD-10-CM | POA: Diagnosis not present

## 2016-01-22 DIAGNOSIS — F419 Anxiety disorder, unspecified: Secondary | ICD-10-CM | POA: Diagnosis not present

## 2016-01-22 DIAGNOSIS — G4733 Obstructive sleep apnea (adult) (pediatric): Secondary | ICD-10-CM | POA: Diagnosis not present

## 2016-01-22 DIAGNOSIS — Z87891 Personal history of nicotine dependence: Secondary | ICD-10-CM | POA: Diagnosis not present

## 2016-01-23 ENCOUNTER — Telehealth: Payer: Self-pay | Admitting: Internal Medicine

## 2016-01-23 DIAGNOSIS — F329 Major depressive disorder, single episode, unspecified: Secondary | ICD-10-CM | POA: Diagnosis not present

## 2016-01-23 DIAGNOSIS — E785 Hyperlipidemia, unspecified: Secondary | ICD-10-CM | POA: Diagnosis not present

## 2016-01-23 DIAGNOSIS — I951 Orthostatic hypotension: Secondary | ICD-10-CM | POA: Diagnosis not present

## 2016-01-23 DIAGNOSIS — I872 Venous insufficiency (chronic) (peripheral): Secondary | ICD-10-CM | POA: Diagnosis not present

## 2016-01-23 DIAGNOSIS — J449 Chronic obstructive pulmonary disease, unspecified: Secondary | ICD-10-CM | POA: Diagnosis not present

## 2016-01-23 DIAGNOSIS — G4733 Obstructive sleep apnea (adult) (pediatric): Secondary | ICD-10-CM | POA: Diagnosis not present

## 2016-01-23 NOTE — Telephone Encounter (Signed)
[name], nurse from [facility], request order for nurse visit Sabrina Page 2 week 2, 1 week 1. She also request order for OT evaluation. She wants to let Dr. Camila Li know of possible interaction for Tramadol and Fluoxetine. Please call her back

## 2016-01-24 NOTE — Telephone Encounter (Signed)
OK on all accounts Thx

## 2016-01-27 ENCOUNTER — Ambulatory Visit (INDEPENDENT_AMBULATORY_CARE_PROVIDER_SITE_OTHER): Payer: Medicare Other | Admitting: Nurse Practitioner

## 2016-01-27 ENCOUNTER — Other Ambulatory Visit (INDEPENDENT_AMBULATORY_CARE_PROVIDER_SITE_OTHER): Payer: Medicare Other

## 2016-01-27 ENCOUNTER — Encounter: Payer: Self-pay | Admitting: Nurse Practitioner

## 2016-01-27 VITALS — BP 110/62 | HR 86 | Wt 225.0 lb

## 2016-01-27 DIAGNOSIS — I872 Venous insufficiency (chronic) (peripheral): Secondary | ICD-10-CM | POA: Diagnosis not present

## 2016-01-27 DIAGNOSIS — J449 Chronic obstructive pulmonary disease, unspecified: Secondary | ICD-10-CM | POA: Diagnosis not present

## 2016-01-27 DIAGNOSIS — F329 Major depressive disorder, single episode, unspecified: Secondary | ICD-10-CM | POA: Diagnosis not present

## 2016-01-27 DIAGNOSIS — I10 Essential (primary) hypertension: Secondary | ICD-10-CM | POA: Diagnosis not present

## 2016-01-27 DIAGNOSIS — E785 Hyperlipidemia, unspecified: Secondary | ICD-10-CM | POA: Diagnosis not present

## 2016-01-27 DIAGNOSIS — I951 Orthostatic hypotension: Secondary | ICD-10-CM

## 2016-01-27 DIAGNOSIS — Z23 Encounter for immunization: Secondary | ICD-10-CM

## 2016-01-27 DIAGNOSIS — G4733 Obstructive sleep apnea (adult) (pediatric): Secondary | ICD-10-CM | POA: Diagnosis not present

## 2016-01-27 LAB — BASIC METABOLIC PANEL
BUN: 15 mg/dL (ref 6–23)
CO2: 30 mEq/L (ref 19–32)
Calcium: 8.6 mg/dL (ref 8.4–10.5)
Chloride: 103 mEq/L (ref 96–112)
Creatinine, Ser: 1.01 mg/dL (ref 0.40–1.20)
GFR: 56.01 mL/min — ABNORMAL LOW (ref >60.00)
Glucose, Bld: 117 mg/dL — ABNORMAL HIGH (ref 70–99)
Potassium: 4 mEq/L (ref 3.5–5.1)
Sodium: 138 mEq/L (ref 135–145)

## 2016-01-27 MED ORDER — MIDODRINE HCL 5 MG PO TABS: 5.0000 mg | ORAL_TABLET | Freq: Three times a day (TID) | ORAL | 1 refills | Status: DC

## 2016-01-27 NOTE — Assessment & Plan Note (Addendum)
stable Wear compression stocking during day and off at night

## 2016-01-27 NOTE — Progress Notes (Signed)
Subjective:  Patient ID: Sabrina Page, female    DOB: 03/25/1934  Age: 82 y.o. MRN: [id number]  CC: Hospitalization Follow-up (discharged 8days ago, admitted for orthostatic hypotension.)  Patient in room with daughter.  Latosha Barich presents for [employer]. F/up  Dizziness  This is a new problem. The current episode started 1 to 4 weeks ago. The problem occurs daily. The problem has been waxing and waning. Associated symptoms include vertigo and weakness. Pertinent negatives include no abdominal pain, anorexia, chest pain, coughing, nausea, numbness or visual change. The symptoms are aggravated by standing. Treatments tried: current use of midodrine BID as prescribed by hospital. The treatment provided mild relief.   Started Home Physical therapy. Reports monimal tolerance of exercise.  Has occassional drop in BP (70s/60s) Reported persistent low BP with position change (sitting to standing), denies any syncope, no palpitation, no chest pain, stable edema, no change in vision. No confusion.  Reviewed hospital discharge Summary. 03/17: carotid doppler indicates mild stenosis. Diagnostics done during hospitalization: Echo indicates normal EF, mild valve sclerosis and regurgitation, moderate LVH. Normal CXR. CBC, CMP, TSH, cortisol, and UA are normal  Outpatient Medications Prior to Visit  Medication Sig Dispense Refill  . aspirin 81 MG tablet Take 81 mg by mouth every morning.     . B Complex-C (SUPER B COMPLEX PO) Take 1 tablet by mouth every morning.     . Cholecalciferol 1000 UNITS capsule Take 1,000 Units by mouth every morning.     . clonazePAM (KLONOPIN) 0.5 MG tablet TAKE 1 TABLET BY MOUTH TWICE A DAY AS NEEDED FOR ANXIETY/INOMNIA (Patient taking differently: Take 0.5 mg by mouth at bedtime as needed for anxiety (sleep). ) 60 tablet 2  . FLUoxetine (PROZAC) 20 MG tablet Take 1 tablet (20 mg total) by mouth daily. 30 tablet 11  . Fluticasone Furoate-Vilanterol (BREO  ELLIPTA) 200-25 MCG/INH AEPB Inhale 1 puff into the lungs daily. (Patient taking differently: Inhale 1 puff into the lungs daily as needed (shortness of breath/ sleep apnea). ) 30 each 11  . hydroxypropyl methylcellulose / hypromellose (ISOPTO TEARS / GONIOVISC) 2.5 % ophthalmic solution Place 1 drop into both eyes daily as needed for dry eyes.    Marland Kitchen KLOR-CON 8 MEQ tablet Take 8 mEq by mouth daily.    Marland Kitchen loratadine (CLARITIN) 10 MG tablet Take 1 tablet (10 mg total) by mouth every morning. 100 tablet 3  . Omega-3 Fatty Acids (FISH OIL) 1000 MG CAPS Take 1 capsule by mouth every morning.     Marland Kitchen omeprazole (PRILOSEC) 20 MG capsule Take 2 capsules (40 mg total) by mouth daily. (Patient taking differently: Take 20 mg by mouth daily. ) 180 capsule 3  . traMADol (ULTRAM) 50 MG tablet Take 0.5-1 tablets (25-50 mg total) by mouth every 6 (six) hours as needed. (Patient taking differently: Take 50 mg by mouth 2 (two) times daily. ) 120 tablet 3  . midodrine (PROAMATINE) 5 MG tablet Take 1 tablet (5 mg total) by mouth 2 (two) times daily with a meal. 60 tablet 1   No facility-administered medications prior to visit.     ROS See HPI  Objective:  BP 110/62 (BP Location: Left Arm, Cuff Size: Large)   Pulse 86   Wt 225 lb (102.1 kg)   SpO2 93%   BMI 38.62 kg/m   BP Readings from Last 3 Encounters:  01/27/16 110/62  01/19/16 139/78  12/25/15 118/62    Wt Readings from Last 3 Encounters:  01/27/16 225 lb (  102.1 kg)  01/18/16 224 lb 4.8 oz (101.7 kg)  12/25/15 229 lb (103.9 kg)    Physical Exam  Constitutional: She is oriented to person, place, and time. No distress.  Neck: Normal range of motion. Neck supple. No JVD present. No thyromegaly present.  Cardiovascular: Normal rate and normal heart sounds.   Pulmonary/Chest: Effort normal and breath sounds normal.  Abdominal: Soft. Bowel sounds are normal.  Musculoskeletal: She exhibits edema.  Edema stable per patient  Lymphadenopathy:    She  has no cervical adenopathy.  Neurological: She is alert and oriented to person, place, and time.  Patient in wheelchair  Skin: Skin is warm and dry.  Psychiatric: She has a normal mood and affect. Her behavior is normal.  Vitals reviewed.   Lab Results  Component Value Date   WBC 4.1 01/19/2016   HGB 13.7 01/19/2016   HCT 43.2 01/19/2016   PLT 151 01/19/2016   GLUCOSE 117 (H) 01/27/2016   CHOL 270 (H) 06/12/2014   TRIG 132.0 06/12/2014   HDL 52.40 06/12/2014   LDLDIRECT 159.1 05/16/2012   LDLCALC 191 (H) 06/12/2014   ALT 14 12/25/2015   AST 15 12/25/2015   NA 138 01/27/2016   K 4.0 01/27/2016   CL 103 01/27/2016   CREATININE 1.01 01/27/2016   BUN 15 01/27/2016   CO2 30 01/27/2016   TSH 2.703 01/19/2016   INR 1.07 09/06/2011   HGBA1C 5.6 12/25/2015    Dg Chest 2 View  Result Date: 01/18/2016 CLINICAL DATA:  Near syncope.  COPD. EXAM: CHEST  2 VIEW COMPARISON:  09/06/2011 FINDINGS: Heart and mediastinal contours are within normal limits. No focal opacities or effusions. No acute bony abnormality. Degenerative changes in the thoracic spine. IMPRESSION: No active cardiopulmonary disease. Electronically Signed   By: Rolm Baptise M.D.   On: 01/18/2016 15:13    Assessment & Plan:   Areeya was seen today for hospitalization follow-up.  Diagnoses and all orders for this visit:  Orthostatic hypotension -     Basic Metabolic Panel (BMET); Future -     Ambulatory referral to Neurology -     midodrine (PROAMATINE) 5 MG tablet; Take 1 tablet (5 mg total) by mouth 3 (three) times daily with meals.  Encounter for immunization -     Flu vaccine HIGH DOSE PF  Venous (peripheral) insufficiency  Essential hypertension   I have changed Ms. Mcduffey's midodrine. I am also having her maintain her aspirin, Fish Oil, Cholecalciferol, B Complex-C (SUPER B COMPLEX PO), fluticasone furoate-vilanterol, omeprazole, FLUoxetine, loratadine, traMADol, clonazePAM, KLOR-CON, and  hydroxypropyl methylcellulose / hypromellose.  Meds ordered this encounter  Medications  . midodrine (PROAMATINE) 5 MG tablet    Sig: Take 1 tablet (5 mg total) by mouth 3 (three) times daily with meals.    Dispense:  60 tablet    Refill:  1    Order Specific Question:   Supervising Provider    Answer:   Cassandria Anger [[id number]]    Follow-up: Return in about 2 weeks (around 02/10/2016) for orthostatic hypotension, Midodrine dose increased.  Wilfred Lacy, NP

## 2016-01-27 NOTE — Telephone Encounter (Signed)
Left detailed mess with [name] advising of below.

## 2016-01-27 NOTE — Assessment & Plan Note (Addendum)
Repeat BMP Increased midodrine dose to TID. Continue use of ABD binder and compression stocking. Continue Home physical therapy. Echocardiogram and carotid doppler (no acute finding). CBC, TSH, Cortisol, CMP are stable. Refer to neurology. F/up 2weeks

## 2016-01-27 NOTE — Progress Notes (Signed)
Pre visit review using our clinic review tool, if applicable. No additional management support is needed unless otherwise documented below in the visit note. 

## 2016-01-27 NOTE — Assessment & Plan Note (Signed)
No need for BP medication at this time

## 2016-01-27 NOTE — Patient Instructions (Addendum)
Maintain f/up with pcp Go to lab for repeat BMP Will call with neurology appt. Continue midodrine, PT home, use of ABD binder and compression stocking. Encourage adequate oral hydartion and diet.

## 2016-01-28 DIAGNOSIS — E785 Hyperlipidemia, unspecified: Secondary | ICD-10-CM | POA: Diagnosis not present

## 2016-01-28 DIAGNOSIS — J449 Chronic obstructive pulmonary disease, unspecified: Secondary | ICD-10-CM | POA: Diagnosis not present

## 2016-01-28 DIAGNOSIS — G4733 Obstructive sleep apnea (adult) (pediatric): Secondary | ICD-10-CM | POA: Diagnosis not present

## 2016-01-28 DIAGNOSIS — F329 Major depressive disorder, single episode, unspecified: Secondary | ICD-10-CM | POA: Diagnosis not present

## 2016-01-28 DIAGNOSIS — I872 Venous insufficiency (chronic) (peripheral): Secondary | ICD-10-CM | POA: Diagnosis not present

## 2016-01-28 DIAGNOSIS — I951 Orthostatic hypotension: Secondary | ICD-10-CM | POA: Diagnosis not present

## 2016-01-29 DIAGNOSIS — G4733 Obstructive sleep apnea (adult) (pediatric): Secondary | ICD-10-CM | POA: Diagnosis not present

## 2016-01-29 DIAGNOSIS — E785 Hyperlipidemia, unspecified: Secondary | ICD-10-CM | POA: Diagnosis not present

## 2016-01-29 DIAGNOSIS — J449 Chronic obstructive pulmonary disease, unspecified: Secondary | ICD-10-CM | POA: Diagnosis not present

## 2016-01-29 DIAGNOSIS — I951 Orthostatic hypotension: Secondary | ICD-10-CM | POA: Diagnosis not present

## 2016-01-29 DIAGNOSIS — F329 Major depressive disorder, single episode, unspecified: Secondary | ICD-10-CM | POA: Diagnosis not present

## 2016-01-29 DIAGNOSIS — I872 Venous insufficiency (chronic) (peripheral): Secondary | ICD-10-CM | POA: Diagnosis not present

## 2016-01-31 DIAGNOSIS — E785 Hyperlipidemia, unspecified: Secondary | ICD-10-CM | POA: Diagnosis not present

## 2016-01-31 DIAGNOSIS — J449 Chronic obstructive pulmonary disease, unspecified: Secondary | ICD-10-CM | POA: Diagnosis not present

## 2016-01-31 DIAGNOSIS — I872 Venous insufficiency (chronic) (peripheral): Secondary | ICD-10-CM | POA: Diagnosis not present

## 2016-01-31 DIAGNOSIS — I951 Orthostatic hypotension: Secondary | ICD-10-CM | POA: Diagnosis not present

## 2016-01-31 DIAGNOSIS — F329 Major depressive disorder, single episode, unspecified: Secondary | ICD-10-CM | POA: Diagnosis not present

## 2016-01-31 DIAGNOSIS — G4733 Obstructive sleep apnea (adult) (pediatric): Secondary | ICD-10-CM | POA: Diagnosis not present

## 2016-02-03 DIAGNOSIS — G4733 Obstructive sleep apnea (adult) (pediatric): Secondary | ICD-10-CM | POA: Diagnosis not present

## 2016-02-03 DIAGNOSIS — I872 Venous insufficiency (chronic) (peripheral): Secondary | ICD-10-CM | POA: Diagnosis not present

## 2016-02-03 DIAGNOSIS — I951 Orthostatic hypotension: Secondary | ICD-10-CM | POA: Diagnosis not present

## 2016-02-03 DIAGNOSIS — J449 Chronic obstructive pulmonary disease, unspecified: Secondary | ICD-10-CM | POA: Diagnosis not present

## 2016-02-03 DIAGNOSIS — E785 Hyperlipidemia, unspecified: Secondary | ICD-10-CM | POA: Diagnosis not present

## 2016-02-03 DIAGNOSIS — F329 Major depressive disorder, single episode, unspecified: Secondary | ICD-10-CM | POA: Diagnosis not present

## 2016-02-06 DIAGNOSIS — G4733 Obstructive sleep apnea (adult) (pediatric): Secondary | ICD-10-CM | POA: Diagnosis not present

## 2016-02-06 DIAGNOSIS — F329 Major depressive disorder, single episode, unspecified: Secondary | ICD-10-CM | POA: Diagnosis not present

## 2016-02-06 DIAGNOSIS — I872 Venous insufficiency (chronic) (peripheral): Secondary | ICD-10-CM | POA: Diagnosis not present

## 2016-02-06 DIAGNOSIS — I951 Orthostatic hypotension: Secondary | ICD-10-CM | POA: Diagnosis not present

## 2016-02-06 DIAGNOSIS — E785 Hyperlipidemia, unspecified: Secondary | ICD-10-CM | POA: Diagnosis not present

## 2016-02-06 DIAGNOSIS — J449 Chronic obstructive pulmonary disease, unspecified: Secondary | ICD-10-CM | POA: Diagnosis not present

## 2016-02-09 DIAGNOSIS — I951 Orthostatic hypotension: Secondary | ICD-10-CM | POA: Diagnosis not present

## 2016-02-09 DIAGNOSIS — F329 Major depressive disorder, single episode, unspecified: Secondary | ICD-10-CM | POA: Diagnosis not present

## 2016-02-09 DIAGNOSIS — G4733 Obstructive sleep apnea (adult) (pediatric): Secondary | ICD-10-CM | POA: Diagnosis not present

## 2016-02-09 DIAGNOSIS — E785 Hyperlipidemia, unspecified: Secondary | ICD-10-CM | POA: Diagnosis not present

## 2016-02-09 DIAGNOSIS — J449 Chronic obstructive pulmonary disease, unspecified: Secondary | ICD-10-CM | POA: Diagnosis not present

## 2016-02-09 DIAGNOSIS — I872 Venous insufficiency (chronic) (peripheral): Secondary | ICD-10-CM | POA: Diagnosis not present

## 2016-02-10 ENCOUNTER — Encounter: Payer: Self-pay | Admitting: Internal Medicine

## 2016-02-10 ENCOUNTER — Other Ambulatory Visit (INDEPENDENT_AMBULATORY_CARE_PROVIDER_SITE_OTHER): Payer: Medicare Other

## 2016-02-10 ENCOUNTER — Ambulatory Visit (INDEPENDENT_AMBULATORY_CARE_PROVIDER_SITE_OTHER): Payer: Medicare Other | Admitting: Internal Medicine

## 2016-02-10 DIAGNOSIS — R55 Syncope and collapse: Secondary | ICD-10-CM | POA: Diagnosis not present

## 2016-02-10 DIAGNOSIS — I951 Orthostatic hypotension: Secondary | ICD-10-CM | POA: Diagnosis not present

## 2016-02-10 LAB — BASIC METABOLIC PANEL
BUN: 15 mg/dL (ref 6–23)
CO2: 33 mEq/L — ABNORMAL HIGH (ref 19–32)
Calcium: 9.1 mg/dL (ref 8.4–10.5)
Chloride: 101 mEq/L (ref 96–112)
Creatinine, Ser: 0.96 mg/dL (ref 0.40–1.20)
GFR: 59.38 mL/min — ABNORMAL LOW (ref >60.00)
Glucose, Bld: 95 mg/dL (ref 70–99)
Potassium: 4.5 mEq/L (ref 3.5–5.1)
Sodium: 139 mEq/L (ref 135–145)

## 2016-02-10 LAB — CORTISOL: Cortisol, Plasma: 10.5 ug/dL

## 2016-02-10 MED ORDER — MIDODRINE HCL 10 MG PO TABS: 10.0000 mg | ORAL_TABLET | Freq: Three times a day (TID) | ORAL | 5 refills | Status: DC

## 2016-02-10 NOTE — Assessment & Plan Note (Addendum)
Increase Midodrine 10 mg to tid Neurol ref pending BMET, cortisol

## 2016-02-10 NOTE — Progress Notes (Signed)
Pre visit review using our clinic review tool, if applicable. No additional management support is needed unless otherwise documented below in the visit note. 

## 2016-02-10 NOTE — Assessment & Plan Note (Signed)
Increase Midodrine to tid Neurol ref pending BMET

## 2016-02-10 NOTE — Progress Notes (Signed)
Subjective:  Patient ID: Sabrina Page, female    DOB: Nov 26, 1933  Age: 82 y.o. MRN: [id number]  CC: No chief complaint on file.   HPI Sabrina Page presents for dizziness. There has been BP drop with seating 158/88 standing 120/80. She went to ER and she was hospitalized - d/c'd on 8/28 on Midodrine bid.   Outpatient Medications Prior to Visit  Medication Sig Dispense Refill  . aspirin 81 MG tablet Take 81 mg by mouth every morning.     . B Complex-C (SUPER B COMPLEX PO) Take 1 tablet by mouth every morning.     . Cholecalciferol 1000 UNITS capsule Take 1,000 Units by mouth every morning.     . clonazePAM (KLONOPIN) 0.5 MG tablet TAKE 1 TABLET BY MOUTH TWICE A DAY AS NEEDED FOR ANXIETY/INOMNIA (Patient taking differently: Take 0.5 mg by mouth at bedtime as needed for anxiety (sleep). ) 60 tablet 2  . FLUoxetine (PROZAC) 20 MG tablet Take 1 tablet (20 mg total) by mouth daily. 30 tablet 11  . hydroxypropyl methylcellulose / hypromellose (ISOPTO TEARS / GONIOVISC) 2.5 % ophthalmic solution Place 1 drop into both eyes daily as needed for dry eyes.    Marland Kitchen KLOR-CON 8 MEQ tablet Take 8 mEq by mouth daily.    Marland Kitchen loratadine (CLARITIN) 10 MG tablet Take 1 tablet (10 mg total) by mouth every morning. 100 tablet 3  . midodrine (PROAMATINE) 5 MG tablet Take 1 tablet (5 mg total) by mouth 3 (three) times daily with meals. 60 tablet 1  . Omega-3 Fatty Acids (FISH OIL) 1000 MG CAPS Take 1 capsule by mouth every morning.     Marland Kitchen omeprazole (PRILOSEC) 20 MG capsule Take 2 capsules (40 mg total) by mouth daily. (Patient taking differently: Take 20 mg by mouth daily. ) 180 capsule 3  . traMADol (ULTRAM) 50 MG tablet Take 0.5-1 tablets (25-50 mg total) by mouth every 6 (six) hours as needed. (Patient taking differently: Take 50 mg by mouth 2 (two) times daily. ) 120 tablet 3  . Fluticasone Furoate-Vilanterol (BREO ELLIPTA) 200-25 MCG/INH AEPB Inhale 1 puff into the lungs daily. (Patient not taking:  Reported on 02/10/2016) 30 each 11   No facility-administered medications prior to visit.     ROS Review of Systems  Constitutional: Positive for fatigue. Negative for activity change, appetite change, chills and unexpected weight change.  HENT: Negative for congestion, mouth sores and sinus pressure.   Eyes: Negative for visual disturbance.  Respiratory: Negative for cough and chest tightness.   Gastrointestinal: Negative for abdominal pain and nausea.  Genitourinary: Negative for difficulty urinating, frequency and vaginal pain.  Musculoskeletal: Negative for back pain and gait problem.  Skin: Negative for pallor and rash.  Neurological: Positive for syncope and weakness. Negative for dizziness, tremors, numbness and headaches.  Psychiatric/Behavioral: Negative for confusion and sleep disturbance.    Objective:  BP 130/82   Pulse 72   Wt 226 lb (102.5 kg)   SpO2 96%   BMI 38.79 kg/m   BP Readings from Last 3 Encounters:  02/10/16 130/82  01/27/16 110/62  01/19/16 139/78    Wt Readings from Last 3 Encounters:  02/10/16 226 lb (102.5 kg)  01/27/16 225 lb (102.1 kg)  01/18/16 224 lb 4.8 oz (101.7 kg)    Physical Exam  Constitutional: She appears well-developed. No distress.  HENT:  Head: Normocephalic.  Right Ear: External ear normal.  Left Ear: External ear normal.  Nose: Nose normal.  Mouth/Throat: Oropharynx is  clear and moist.  Eyes: Conjunctivae are normal. Pupils are equal, round, and reactive to light. Right eye exhibits no discharge. Left eye exhibits no discharge.  Neck: Normal range of motion. Neck supple. No JVD present. No tracheal deviation present. No thyromegaly present.  Cardiovascular: Normal rate, regular rhythm and normal heart sounds.   Pulmonary/Chest: No stridor. No respiratory distress. She has no wheezes.  Abdominal: Soft. Bowel sounds are normal. She exhibits no distension and no mass. There is no tenderness. There is no rebound and no  guarding.  Musculoskeletal: She exhibits no edema or tenderness.  Lymphadenopathy:    She has no cervical adenopathy.  Neurological: She displays normal reflexes. No cranial nerve deficit. She exhibits normal muscle tone. Coordination normal.  Skin: No rash noted. No erythema.  Psychiatric: She has a normal mood and affect. Her behavior is normal. Judgment and thought content normal.  Obese Sitting 140/80; standing 90/70 Lab Results  Component Value Date   WBC 4.1 01/19/2016   HGB 13.7 01/19/2016   HCT 43.2 01/19/2016   PLT 151 01/19/2016   GLUCOSE 117 (H) 01/27/2016   CHOL 270 (H) 06/12/2014   TRIG 132.0 06/12/2014   HDL 52.40 06/12/2014   LDLDIRECT 159.1 05/16/2012   LDLCALC 191 (H) 06/12/2014   ALT 14 12/25/2015   AST 15 12/25/2015   NA 138 01/27/2016   K 4.0 01/27/2016   CL 103 01/27/2016   CREATININE 1.01 01/27/2016   BUN 15 01/27/2016   CO2 30 01/27/2016   TSH 2.703 01/19/2016   INR 1.07 09/06/2011   HGBA1C 5.6 12/25/2015    Dg Chest 2 View  Result Date: 01/18/2016 CLINICAL DATA:  Near syncope.  COPD. EXAM: CHEST  2 VIEW COMPARISON:  09/06/2011 FINDINGS: Heart and mediastinal contours are within normal limits. No focal opacities or effusions. No acute bony abnormality. Degenerative changes in the thoracic spine. IMPRESSION: No active cardiopulmonary disease. Electronically Signed   By: Rolm Baptise M.D.   On: 01/18/2016 15:13    Assessment & Plan:   There are no diagnoses linked to this encounter. I am having Sabrina Page maintain her aspirin, Fish Oil, Cholecalciferol, B Complex-C (SUPER B COMPLEX PO), fluticasone furoate-vilanterol, omeprazole, FLUoxetine, loratadine, traMADol, clonazePAM, KLOR-CON, hydroxypropyl methylcellulose / hypromellose, and midodrine.  No orders of the defined types were placed in this encounter.    Follow-up: No Follow-up on file.  [name], MD

## 2016-02-11 DIAGNOSIS — F329 Major depressive disorder, single episode, unspecified: Secondary | ICD-10-CM | POA: Diagnosis not present

## 2016-02-11 DIAGNOSIS — G4733 Obstructive sleep apnea (adult) (pediatric): Secondary | ICD-10-CM | POA: Diagnosis not present

## 2016-02-11 DIAGNOSIS — I872 Venous insufficiency (chronic) (peripheral): Secondary | ICD-10-CM | POA: Diagnosis not present

## 2016-02-11 DIAGNOSIS — I951 Orthostatic hypotension: Secondary | ICD-10-CM | POA: Diagnosis not present

## 2016-02-11 DIAGNOSIS — E785 Hyperlipidemia, unspecified: Secondary | ICD-10-CM | POA: Diagnosis not present

## 2016-02-11 DIAGNOSIS — J449 Chronic obstructive pulmonary disease, unspecified: Secondary | ICD-10-CM | POA: Diagnosis not present

## 2016-02-13 DIAGNOSIS — I872 Venous insufficiency (chronic) (peripheral): Secondary | ICD-10-CM | POA: Diagnosis not present

## 2016-02-13 DIAGNOSIS — I951 Orthostatic hypotension: Secondary | ICD-10-CM | POA: Diagnosis not present

## 2016-02-13 DIAGNOSIS — E785 Hyperlipidemia, unspecified: Secondary | ICD-10-CM | POA: Diagnosis not present

## 2016-02-13 DIAGNOSIS — F329 Major depressive disorder, single episode, unspecified: Secondary | ICD-10-CM | POA: Diagnosis not present

## 2016-02-13 DIAGNOSIS — J449 Chronic obstructive pulmonary disease, unspecified: Secondary | ICD-10-CM | POA: Diagnosis not present

## 2016-02-13 DIAGNOSIS — G4733 Obstructive sleep apnea (adult) (pediatric): Secondary | ICD-10-CM | POA: Diagnosis not present

## 2016-02-16 DIAGNOSIS — I951 Orthostatic hypotension: Secondary | ICD-10-CM | POA: Diagnosis not present

## 2016-02-16 DIAGNOSIS — F329 Major depressive disorder, single episode, unspecified: Secondary | ICD-10-CM | POA: Diagnosis not present

## 2016-02-16 DIAGNOSIS — G4733 Obstructive sleep apnea (adult) (pediatric): Secondary | ICD-10-CM | POA: Diagnosis not present

## 2016-02-16 DIAGNOSIS — I872 Venous insufficiency (chronic) (peripheral): Secondary | ICD-10-CM | POA: Diagnosis not present

## 2016-02-16 DIAGNOSIS — E785 Hyperlipidemia, unspecified: Secondary | ICD-10-CM | POA: Diagnosis not present

## 2016-02-16 DIAGNOSIS — J449 Chronic obstructive pulmonary disease, unspecified: Secondary | ICD-10-CM | POA: Diagnosis not present

## 2016-02-17 ENCOUNTER — Other Ambulatory Visit (INDEPENDENT_AMBULATORY_CARE_PROVIDER_SITE_OTHER): Payer: Medicare Other

## 2016-02-17 ENCOUNTER — Encounter: Payer: Self-pay | Admitting: Neurology

## 2016-02-17 ENCOUNTER — Other Ambulatory Visit: Payer: Self-pay | Admitting: Neurology

## 2016-02-17 ENCOUNTER — Ambulatory Visit (INDEPENDENT_AMBULATORY_CARE_PROVIDER_SITE_OTHER): Payer: Medicare Other | Admitting: Neurology

## 2016-02-17 VITALS — BP 130/80 | HR 71 | Ht 64.0 in | Wt 228.1 lb

## 2016-02-17 DIAGNOSIS — I951 Orthostatic hypotension: Secondary | ICD-10-CM

## 2016-02-17 DIAGNOSIS — G3184 Mild cognitive impairment, so stated: Secondary | ICD-10-CM

## 2016-02-17 DIAGNOSIS — G609 Hereditary and idiopathic neuropathy, unspecified: Secondary | ICD-10-CM

## 2016-02-17 LAB — SEDIMENTATION RATE: Sed Rate: 15 mm/hr (ref 0–30)

## 2016-02-17 LAB — VITAMIN B12: Vitamin B-12: 556 pg/mL (ref 211–911)

## 2016-02-17 NOTE — Progress Notes (Signed)
[name] - Initial Visit   Date: 02/17/16  [name] MRN: [id number] DOB: 1934/01/14   Dear Dr. Alain Marion:  Thank you for your kind referral of Sabrina Page for consultation of orthostasis. Although her history is well known to you, please allow [name] to reiterate it for the purpose of our medical record. The patient was accompanied to the clinic by daughter and [place] interpretor who also provides collateral information.     History of Present Illness: Sabrina Page is a 82 y.o. female with ashtma, GERD, depression, hyperlipidemia, CKD, and peripheral vascular disease presenting for evaluation of orthostasis.    Starting in the spring of 2017, she began having lightheadedness especially worse in the morning which improves as the day goes on.  Every time she stands, she gets lightheaded and has had 6 falls over the past year, the last occurring in May. She does not take any antihypertensive medication or diuretics, although she has leg swelling.  She was taken to the ER on 01/18/2016 after her daughter, a home health nurse, checked her orthostatic blood pressure and her SBP was noted to be in the 60s and she was very symptomatic.  She started midodrine 59m BID in early September and has noticed 30% improvement.  She has been complaint with using a walker and feels that this helps.  Her daughter also complains that patient may have some memory loss and tends to be very suspicious about things.  She states that patient has always been very jealous.  There are no delusional thoughts or hallucinations.  Patient lives with her husband, who is also suffering from memory problems. She has a caregiver that comes for a few hours and her daughter who helps with many IADLs.  No bowel/bladder dysfunction.  She also complains of dry eyes, no dry mouth.   She endorses numbness of the feet which is worse at night time.    Out-side paper  records, electronic medical record, and images have been reviewed where available and summarized as:  UKoreacarotids 07/28/2015:  1-39% bilateral ICA stenosis  Lab Results  Component Value Date   TSH 2.703 01/19/2016   Lab Results  Component Value Date   [id number] 09903/25/2014     Past Medical History:  Diagnosis Date  . Allergy    rhinitis  . Asthma   . Chronic kidney disease    hx of cystitis   . COPD ([place] obstructive pulmonary disease) ([place]   . Depression   . Dizziness    vertigo   . Gallstones   . GERD (gastroesophageal reflux disease)   . Hyperlipidemia   . Hypertension   . Obesity   . Osteoarthritis   . Osteoporosis   . Peripheral vascular disease (H[facility])    swelling   . Pneumonia   . Shortness of breath    with exertion   . Shoulder injury    left  . Sleep apnea    never diagnosed   . Urinary incontinence     Past Surgical History:  Procedure Laterality Date  . CHOLECYSTECTOMY  09/14/2011   Procedure: LAPAROSCOPIC CHOLECYSTECTOMY;  Surgeon: TOdis Hollingshead MD;  Location: [facility];  Service: General;  Laterality: N/A;  attempted intraoperative cholangiogram   . L TKR    . LIVER BIOPSY  09/14/2011   Procedure: LIVER BIOPSY;  Surgeon: TOdis Hollingshead MD;  Location: [facility];  Service: General;  Laterality: N/A;  . OTHER SURGICAL HISTORY  left leg surgery   . OTHER SURGICAL HISTORY     left breast surgery due to mastitis   . right left knee arthroplast       Medications:  Outpatient Encounter Prescriptions as of 02/17/2016  Medication Sig  . aspirin 81 MG tablet Take 81 mg by mouth every morning.   . B Complex-C (SUPER B COMPLEX PO) Take 1 tablet by mouth every morning.   . Cholecalciferol 1000 UNITS capsule Take 1,000 Units by mouth every morning.   . clonazePAM (KLONOPIN) 0.5 MG tablet TAKE 1 TABLET BY MOUTH TWICE A DAY AS NEEDED FOR ANXIETY/INOMNIA (Patient taking differently: Take 0.5 mg by mouth at bedtime as needed for anxiety (sleep). )  .  FLUoxetine (PROZAC) 20 MG tablet Take 1 tablet (20 mg total) by mouth daily.  . Fluticasone Furoate-Vilanterol (BREO ELLIPTA) 200-25 MCG/INH AEPB Inhale 1 puff into the lungs daily.  . hydroxypropyl methylcellulose / hypromellose (ISOPTO TEARS / GONIOVISC) 2.5 % ophthalmic solution Place 1 drop into both eyes daily as needed for dry eyes.  Marland Kitchen KLOR-CON 8 MEQ tablet Take 8 mEq by mouth daily.  Marland Kitchen loratadine (CLARITIN) 10 MG tablet Take 1 tablet (10 mg total) by mouth every morning.  . midodrine (PROAMATINE) 10 MG tablet Take 1 tablet (10 mg total) by mouth 3 (three) times daily.  . Omega-3 Fatty Acids (FISH OIL) 1000 MG CAPS Take 1 capsule by mouth every morning.   Marland Kitchen omeprazole (PRILOSEC) 20 MG capsule Take 2 capsules (40 mg total) by mouth daily. (Patient taking differently: Take 20 mg by mouth daily. )  . traMADol (ULTRAM) 50 MG tablet Take 0.5-1 tablets (25-50 mg total) by mouth every 6 (six) hours as needed. (Patient taking differently: Take 50 mg by mouth 2 (two) times daily. )   No facility-administered encounter medications on file as of 02/17/2016.      Allergies:  Allergies  Allergen Reactions  . Atorvastatin     REACTION: upset stomach  . Enalapril Maleate   . Hctz [Hydrochlorothiazide]     Dizzy   . Lovastatin     REACTION: tongue stress  . Simvastatin     REACTION: mouth sores  . Topamax [Topiramate]     syncope    Family History: Family History  Problem Relation Age of Onset  . Hypertension Mother   . Arthritis Mother   . Diabetes Mother   . Stroke Mother   . Stroke Father   . Hypertension Father   . Hypertension Other   . CAD Neg Hx     Social History: Social History  Substance Use Topics  . Smoking status: Former Smoker    Packs/day: 0.50    Years: 35.00    Types: Cigarettes    Quit date: 05/24/2001  . Smokeless tobacco: Never Used  . Alcohol use Yes     Comment: socially    Social History   Social History Narrative   Lives with husband in an  apartment on the 3rd floor.  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  Has 2 children.  Education: college.      Review of Systems:  CONSTITUTIONAL: No fevers, chills, night sweats, or weight loss.   EYES: No visual changes or eye pain ENT: No hearing changes.  No history of nose bleeds.   RESPIRATORY: No cough, wheezing and shortness of breath.   CARDIOVASCULAR: Negative for chest pain, and palpitations.   GI: Negative for abdominal discomfort, blood in stools or black stools.  No recent change  in bowel habits.   GU:  No history of incontinence.   MUSCLOSKELETAL: No history of joint pain +swelling.  No myalgias.   SKIN: Negative for lesions, rash, and itching.   HEMATOLOGY/ONCOLOGY: Negative for prolonged bleeding, bruising easily, and swollen nodes.  No history of cancer.   ENDOCRINE: Negative for cold or heat intolerance, polydipsia or goiter.   PSYCH:  +depression or anxiety symptoms.   NEURO: As Above.   Vital Signs:  BP 130/80   Pulse 71   Ht _0  (1.626 m)   Wt 228 lb 2 oz (103.5 kg)   SpO2 95%   BMI 39.16 kg/m  Orthostatic VS for the past 24 hrs (Last 3 readings):  BP- Lying Pulse- Lying BP- Sitting Pulse- Sitting BP- Standing at 0 minutes Pulse- Standing at 0 minutes  02/17/16 1014 144/80 66 130/80 71 120/70 76    General Medical Exam:   General:  Well appearing, comfortable.   Eyes/ENT: see cranial nerve examination.   Neck: No masses appreciated.  Full range of motion without tenderness.  No carotid bruits. Respiratory:  Clear to auscultation, good air entry bilaterally.   Cardiac:  Regular rate and rhythm, no murmur.   Extremities:  No deformities, +2 pitting edema.  Skin:  No rashes or lesions.  Neurological Exam: MENTAL STATUS including orientation to time, place, person, recent and remote memory, attention span and concentration, language, and fund of knowledge is fair.  Speech is not dysarthric. Montreal Cognitive Assessment  02/17/2016  Visuospatial/ Executive (0/5) 2    Naming (0/3) 3  Attention: Read list of digits (0/2) 2  Attention: Read list of letters (0/1) 1  Attention: Serial 7 subtraction starting at 100 (0/3) 3  Language: Repeat phrase (0/2) 2  Language : Fluency (0/1) 1  Abstraction (0/2) 2  Delayed Recall (0/5) 0  Orientation (0/6) 6  Total 22  Adjusted Score (based on education) 22   CRANIAL NERVES: II:  No visual field defects.  Unremarkable fundi.   III-IV-VI: Pupils equal round and reactive to light.  Normal conjugate, extra-ocular eye movements in all directions of gaze.  No nystagmus.  No ptosis.   V:  Normal facial sensation.   VII:  Normal facial symmetry and movements.  No pathologic facial reflexes.  VIII:  Normal hearing and vestibular function.   IX-X:  Normal palatal movement.   XI:  Normal shoulder shrug and head rotation.   XII:  Normal tongue strength and range of motion, no deviation or fasciculation.  MOTOR:  No atrophy, fasciculations or abnormal movements.  No pronator drift.  Tone is normal.    Right Upper Extremity:    Left Upper Extremity:    Deltoid  5/5   Deltoid  5/5   Biceps  5/5   Biceps  5/5   Triceps  5/5   Triceps  5/5   Wrist extensors  5/5   Wrist extensors  5/5   Wrist flexors  5/5   Wrist flexors  5/5   Finger extensors  5/5   Finger extensors  5/5   Finger flexors  5/5   Finger flexors  5/5   Dorsal interossei  5/5   Dorsal interossei  5/5   Abductor pollicis  5/5   Abductor pollicis  5/5   Tone (Ashworth scale)  0  Tone (Ashworth scale)  0   Right Lower Extremity:    Left Lower Extremity:    Hip flexors  5/5   Hip flexors  5/5  Hip extensors  5/5   Hip extensors  5/5   Knee flexors  5/5   Knee flexors  5/5   Knee extensors  5/5   Knee extensors  5/5   Dorsiflexors  5/5   Dorsiflexors  5/5   Plantarflexors  5/5   Plantarflexors  5/5   Toe extensors  5/5   Toe extensors  5/5   Toe flexors  5/5   Toe flexors  5/5   Tone (Ashworth scale)  0  Tone (Ashworth scale)  0   MSRs:  Right                                                                  Left brachioradialis 2+  brachioradialis 2+  biceps 2+  biceps 2+  triceps 2+  triceps 2+  patellar 0  Patellar 0  ankle jerk 0  ankle jerk 0  Hoffman no  Hoffman no  plantar response down  plantar response down   SENSORY:  Vibration, temperature, and light touch is diminished at the ankles. Romberg's sign is present.   COORDINATION/GAIT: Mild dysmetria with right finger-to- nose-finger testing, but this improved with repeated testing, finger to nose testing on the left is normal. Intact rapid alternating movements bilaterally. Gait is slow, cautious, and appears somewhat rigid, right hand is clenched and there is poor arm swing.    IMPRESSION: 1.  Orthostatic hypotension - baseline BP has improved as compared to previously since starting midodrine  - DDx:  Dysautonomia from neuropathy vs early signs of parkinson-plus syndrome such as multiple system atrophy  - No parkinsonian features on exam  - Check labs for neuropathy - vitamin B12, vitamin B6,  SSA/B, copper, SPEP with IFE, ESR  - Consider NCS/EMG going forward, but need to keep in mind that the degree of her edema may cause inaccurate result due to technical factors  - Increase midodrine to 59m TID  - Consider Droxidopa going forward  - Continue home physical therapy  - Encouraged to use rollator at all times  - Fall precaution discussed, makes positional changes slowly to minimize lightheadedness  - Continue to use knee high compression stockings and abdominal binder  2.  Mild cognitive impairment vs early dementia  - She scored MOCA 22/30 on MOCA today, which certainly raises concern for early signs of dementia  - MRI brain wo contrast to be ordered  - Neuropsychological testing would be difficult to perform due to language barrier  - Acetylcholinesterase inhibitors can be considered, but I prefer to start this once her orthostatis has stabilized  Return to clinic in 6  months.   The duration of this appointment visit was 60 minutes of face-to-face time with the patient.  Greater than 50% of this time was spent in counseling, explanation of diagnosis, planning of further management, and coordination of care.   Thank you for allowing me to participate in patient's care.  If I can answer any additional questions, I would be pleased to do so.    Sincerely,    Donika K. PPosey Pronto DO

## 2016-02-17 NOTE — Patient Instructions (Addendum)
1.  Check blood work 2.  MRI brain without contrast 3.  Increase midodrine to 10mg  three times daily 4.  Continue home physical therapy 5.  Encouraged to use rollator at all times 6.  Makes positional changes slowly to minimize lightheadedness 7.  Continue to use knee high compression stockings and abdominal binder  Return to clinic in 6 months  [facility]  Preventing Falls in the Home   Falls are common, often dreaded events in the lives of older people. Aside from the obvious injuries and even death that may result, falls can cause wide-ranging consequences including loss of independence, mental decline, decreased activity, and mobility. Younger people are also at risk of falling, especially those with chronic illnesses and fatigue.  Ways to reduce the risk for falling:  * Examine diet and medications. Warm foods and alcohol dilate blood vessels, which can lead to dizziness when standing. Sleep aids, antidepressants, and pain medications can also increase the likelihood of a fall.  * Get a vison exam. Poor vision, cataracts, and glaucoma increase the chances of falling.  * Check foot gear. Shoes should fit snugly and have a sturdy, nonskid sole and broad, low heel.  * Participate in a physician-approved exercise program to build and maintain muscle strength and improve balance and coordination.  * Increase vitamin D intake. Vitamin D improves muscle strength and increases the amount of calcium the body is able to absorb and deposit in bones.  How to prevent falls from common hazards:  * Floors - Remove all loose wires, cords, and throw rugs. Minimize clutter. Make sure rugs are anchored and smooth. Keep furniture in its usual place.  * Chairs - Use chairs with straight backs, armrests, and firm seats. Add firm cushions to existing pieces to add height.  * Bathroom - Install grab bars and non-skid tape in the tub or shower. Use a bathtub transfer bench or a shower chair with a back  support. Use an elevated toilet seat and/or safety rails to assist standing from a low surface. Do not use towel racks or bathroom tissue holders to help you stand.  * Lighting - Make sure halls, stairways, and entrances are well-lit. Install a night light in your bathroom or hallway. Make sure there is a light switch at the top and bottom of the staircase. Turn lights on if you get up in the middle of the night. Make sure lamps or light switches are within reach of the bed if you have to get up during the night.  * Kitchen - Install non-skid rubber mats near the sink and stove. Clean spills immediately. Store frequently used utensils, pots, and pans between waist and eye level. This helps prevent reaching and bending. Sit when getting things out of the lower cupboards.  * Living room / [place] furniture with wide spaces in between, giving enough room to move around. Establish a route through the living room that gives you something to hold onto as you walk.  * Stairs - Make sure treads, rails, and rugs are secure. Install a rail on both sides of the stairs. If stairs are a threat, it might be helpful to arrange most of your activities on the lower level to reduce the number of times you must climb the stairs.  * Entrances and doorways - Install metal handles on the walls adjacent to the doorknobs of all doors to make it more secure as you travel through the doorway.  Tips for maintaining balance:  *  Keep at least one hand free at all times Try using a backpack or fanny pack to hold things rather than carrying them in your hands. Never carry objects in both hands when walking as this interferes with keeping your balance.  * Attempt to swing both arms from front to back while walking. This might require a conscious effort if Parkinson's disease has diminished your movement. It will, however, help you to maintain balance and posture, and reduce fatigue.  * Consciously lift your feet off the ground  when walking. Shuffling and dragging of the feet is a common culprit in losing your balance.  * When trying to navigate turns, use a "U" technique of facing forward and making a wide turn, rather than pivoting sharply.  * Try to stand with your feet shoulder-length apart. When your feet are close together for any length of time, you increase your risk of losing your balance and falling.  * Do one thing at a time. Do not try to walk and accomplish another task, such as reading or looking around. The decrease in your automatic reflexes complicates motor function, so the less distraction, the better.  * Do not wear rubber or gripping soled shoes, they might "catch" on the floor and cause tripping.  * Move slowly when changing positions. Use deliberate, concentrated movements and, if needed, use a grab bar or walking aid. Count fifteen (15) seconds after standing to begin walking.  * If balance is a continuous problem, you might want to consider a walking aid such as a cane, walking stick, or walker. Once you have mastered walking with help, you may be ready to try it again on your own.  This information is provided by [facility] and is not intended to replace the medical advice of your physician or other health care providers. Please consult your physician or other health care providers for advice regarding your specific medical condition.

## 2016-02-18 LAB — SJOGREN'S SYNDROME ANTIBODS(SSA + SSB)
SSA (Ro) (ENA) Antibody, IgG: <1
SSB (La) (ENA) Antibody, IgG: <1

## 2016-02-19 DIAGNOSIS — G4733 Obstructive sleep apnea (adult) (pediatric): Secondary | ICD-10-CM | POA: Diagnosis not present

## 2016-02-19 DIAGNOSIS — I951 Orthostatic hypotension: Secondary | ICD-10-CM | POA: Diagnosis not present

## 2016-02-19 DIAGNOSIS — F329 Major depressive disorder, single episode, unspecified: Secondary | ICD-10-CM | POA: Diagnosis not present

## 2016-02-19 DIAGNOSIS — E785 Hyperlipidemia, unspecified: Secondary | ICD-10-CM | POA: Diagnosis not present

## 2016-02-19 DIAGNOSIS — I872 Venous insufficiency (chronic) (peripheral): Secondary | ICD-10-CM | POA: Diagnosis not present

## 2016-02-19 DIAGNOSIS — J449 Chronic obstructive pulmonary disease, unspecified: Secondary | ICD-10-CM | POA: Diagnosis not present

## 2016-02-19 LAB — IMMUNOFIXATION ELECTROPHORESIS
IgA: 211 mg/dL (ref 81–463)
IgG (Immunoglobin G), Serum: 1018 mg/dL (ref 694–1618)
IgM, Serum: 171 mg/dL (ref 48–271)

## 2016-02-19 LAB — COPPER, SERUM: Copper: 109 ug/dL (ref 72–166)

## 2016-02-20 LAB — PROTEIN ELECTROPHORESIS, SERUM
Abnormal Protein Band2: NOT DETECTED g/dL
Abnormal Protein Band3: NOT DETECTED g/dL
Albumin ELP: 3.9 g/dL (ref 3.8–4.8)
Alpha-1-Globulin: 0.3 g/dL (ref 0.2–0.3)
Alpha-2-Globulin: 0.7 g/dL (ref 0.5–0.9)
Beta 2: 0.3 g/dL (ref 0.2–0.5)
Beta Globulin: 0.4 g/dL (ref 0.4–0.6)
Gamma Globulin: 0.9 g/dL (ref 0.8–1.7)
Total Protein, Serum Electrophoresis: 6.5 g/dL (ref 6.1–8.1)

## 2016-02-20 LAB — VITAMIN B6: Vitamin B6: 32.7 ng/mL — ABNORMAL HIGH (ref 2.1–21.7)

## 2016-02-24 ENCOUNTER — Telehealth: Payer: Self-pay | Admitting: Internal Medicine

## 2016-02-24 DIAGNOSIS — I951 Orthostatic hypotension: Secondary | ICD-10-CM | POA: Diagnosis not present

## 2016-02-24 DIAGNOSIS — I872 Venous insufficiency (chronic) (peripheral): Secondary | ICD-10-CM | POA: Diagnosis not present

## 2016-02-24 DIAGNOSIS — E785 Hyperlipidemia, unspecified: Secondary | ICD-10-CM | POA: Diagnosis not present

## 2016-02-24 DIAGNOSIS — J449 Chronic obstructive pulmonary disease, unspecified: Secondary | ICD-10-CM | POA: Diagnosis not present

## 2016-02-24 DIAGNOSIS — G4733 Obstructive sleep apnea (adult) (pediatric): Secondary | ICD-10-CM | POA: Diagnosis not present

## 2016-02-24 DIAGNOSIS — F329 Major depressive disorder, single episode, unspecified: Secondary | ICD-10-CM | POA: Diagnosis not present

## 2016-02-24 NOTE — Telephone Encounter (Signed)
Home health called and said patients bp are running low.   todays bps are sitting : 116/70 Standing: 76/50  Patient doesn't seem to be feeling well. She is talking and such. Please follow up with nurse or the daughter if they need to be doing anything else. Thank you.  [place] - Daughter.

## 2016-02-25 NOTE — Telephone Encounter (Signed)
Pls keep ROV. Increase fluid intake: drink diet Poweraid or diet Gatoraid Thx

## 2016-02-26 DIAGNOSIS — E785 Hyperlipidemia, unspecified: Secondary | ICD-10-CM | POA: Diagnosis not present

## 2016-02-26 DIAGNOSIS — G4733 Obstructive sleep apnea (adult) (pediatric): Secondary | ICD-10-CM | POA: Diagnosis not present

## 2016-02-26 DIAGNOSIS — F329 Major depressive disorder, single episode, unspecified: Secondary | ICD-10-CM | POA: Diagnosis not present

## 2016-02-26 DIAGNOSIS — I872 Venous insufficiency (chronic) (peripheral): Secondary | ICD-10-CM | POA: Diagnosis not present

## 2016-02-26 DIAGNOSIS — I951 Orthostatic hypotension: Secondary | ICD-10-CM | POA: Diagnosis not present

## 2016-02-26 DIAGNOSIS — J449 Chronic obstructive pulmonary disease, unspecified: Secondary | ICD-10-CM | POA: Diagnosis not present

## 2016-02-26 NOTE — Telephone Encounter (Signed)
P/t's daughter informed

## 2016-02-29 ENCOUNTER — Other Ambulatory Visit: Payer: Self-pay | Admitting: Internal Medicine

## 2016-03-01 DIAGNOSIS — I872 Venous insufficiency (chronic) (peripheral): Secondary | ICD-10-CM | POA: Diagnosis not present

## 2016-03-01 DIAGNOSIS — G4733 Obstructive sleep apnea (adult) (pediatric): Secondary | ICD-10-CM | POA: Diagnosis not present

## 2016-03-01 DIAGNOSIS — I951 Orthostatic hypotension: Secondary | ICD-10-CM | POA: Diagnosis not present

## 2016-03-01 DIAGNOSIS — F329 Major depressive disorder, single episode, unspecified: Secondary | ICD-10-CM | POA: Diagnosis not present

## 2016-03-01 DIAGNOSIS — E785 Hyperlipidemia, unspecified: Secondary | ICD-10-CM | POA: Diagnosis not present

## 2016-03-01 DIAGNOSIS — J449 Chronic obstructive pulmonary disease, unspecified: Secondary | ICD-10-CM | POA: Diagnosis not present

## 2016-03-02 ENCOUNTER — Ambulatory Visit
Admission: RE | Admit: 2016-03-02 | Discharge: 2016-03-02 | Disposition: A | Discharge status: Home / Self Care | Payer: Medicare Other | Source: Ambulatory Visit | Attending: Neurology | Admitting: Neurology

## 2016-03-02 DIAGNOSIS — I959 Hypotension, unspecified: Secondary | ICD-10-CM | POA: Diagnosis not present

## 2016-03-02 DIAGNOSIS — I951 Orthostatic hypotension: Secondary | ICD-10-CM

## 2016-03-02 DIAGNOSIS — G3184 Mild cognitive impairment, so stated: Secondary | ICD-10-CM

## 2016-03-02 DIAGNOSIS — G609 Hereditary and idiopathic neuropathy, unspecified: Secondary | ICD-10-CM

## 2016-03-05 DIAGNOSIS — F329 Major depressive disorder, single episode, unspecified: Secondary | ICD-10-CM | POA: Diagnosis not present

## 2016-03-05 DIAGNOSIS — E785 Hyperlipidemia, unspecified: Secondary | ICD-10-CM | POA: Diagnosis not present

## 2016-03-05 DIAGNOSIS — J449 Chronic obstructive pulmonary disease, unspecified: Secondary | ICD-10-CM | POA: Diagnosis not present

## 2016-03-05 DIAGNOSIS — G4733 Obstructive sleep apnea (adult) (pediatric): Secondary | ICD-10-CM | POA: Diagnosis not present

## 2016-03-05 DIAGNOSIS — I951 Orthostatic hypotension: Secondary | ICD-10-CM | POA: Diagnosis not present

## 2016-03-05 DIAGNOSIS — I872 Venous insufficiency (chronic) (peripheral): Secondary | ICD-10-CM | POA: Diagnosis not present

## 2016-03-19 ENCOUNTER — Other Ambulatory Visit: Payer: Self-pay | Admitting: Internal Medicine

## 2016-03-24 ENCOUNTER — Other Ambulatory Visit: Payer: Self-pay | Admitting: Internal Medicine

## 2016-03-26 ENCOUNTER — Ambulatory Visit: Payer: Medicare Other | Admitting: Internal Medicine

## 2016-04-02 ENCOUNTER — Other Ambulatory Visit (INDEPENDENT_AMBULATORY_CARE_PROVIDER_SITE_OTHER): Payer: Medicare Other

## 2016-04-02 ENCOUNTER — Encounter: Payer: Self-pay | Admitting: Internal Medicine

## 2016-04-02 ENCOUNTER — Ambulatory Visit (INDEPENDENT_AMBULATORY_CARE_PROVIDER_SITE_OTHER): Payer: Medicare Other | Admitting: Internal Medicine

## 2016-04-02 DIAGNOSIS — R296 Repeated falls: Secondary | ICD-10-CM

## 2016-04-02 DIAGNOSIS — G4700 Insomnia, unspecified: Secondary | ICD-10-CM | POA: Diagnosis not present

## 2016-04-02 DIAGNOSIS — F411 Generalized anxiety disorder: Secondary | ICD-10-CM | POA: Diagnosis not present

## 2016-04-02 DIAGNOSIS — F329 Major depressive disorder, single episode, unspecified: Secondary | ICD-10-CM

## 2016-04-02 DIAGNOSIS — I10 Essential (primary) hypertension: Secondary | ICD-10-CM | POA: Diagnosis not present

## 2016-04-02 DIAGNOSIS — J4540 Moderate persistent asthma, uncomplicated: Secondary | ICD-10-CM

## 2016-04-02 DIAGNOSIS — I951 Orthostatic hypotension: Secondary | ICD-10-CM

## 2016-04-02 LAB — BASIC METABOLIC PANEL
BUN: 13 mg/dL (ref 6–23)
CO2: 29 mEq/L (ref 19–32)
Calcium: 9.3 mg/dL (ref 8.4–10.5)
Chloride: 103 mEq/L (ref 96–112)
Creatinine, Ser: 0.95 mg/dL (ref 0.40–1.20)
GFR: 60.08 mL/min (ref >60.00)
Glucose, Bld: 87 mg/dL (ref 70–99)
Potassium: 4.6 mEq/L (ref 3.5–5.1)
Sodium: 139 mEq/L (ref 135–145)

## 2016-04-02 MED ORDER — FLUOXETINE HCL 40 MG PO CAPS: 40.0000 mg | ORAL_CAPSULE | Freq: Every day | ORAL | 3 refills | Status: DC

## 2016-04-02 NOTE — Assessment & Plan Note (Signed)
On Midodrine 10 mg tid - max dose; Prozac

## 2016-04-02 NOTE — Progress Notes (Signed)
Pre visit review using our clinic review tool, if applicable. No additional management support is needed unless otherwise documented below in the visit note. 

## 2016-04-02 NOTE — Assessment & Plan Note (Signed)
Breo

## 2016-04-02 NOTE — Assessment & Plan Note (Signed)
Increase Prozac to 40 mg/d

## 2016-04-02 NOTE — Progress Notes (Signed)
Subjective:  Patient ID: Sabrina Page, female    DOB: Sep 23, 1933  Age: 82 y.o. MRN: [id number]  CC: No chief complaint on file.   HPI Sabrina Page presents for orthostatic HTN, asthma, depression, LBP f/u. C/o BP drop. No recent falls...  Outpatient Medications Prior to Visit  Medication Sig Dispense Refill  . aspirin 81 MG tablet Take 81 mg by mouth every morning.     . B Complex-C (SUPER B COMPLEX PO) Take 1 tablet by mouth every morning.     . Cholecalciferol 1000 UNITS capsule Take 1,000 Units by mouth every morning.     . clonazePAM (KLONOPIN) 0.5 MG tablet TAKE 1 TABLET BY MOUTH TWICE A DAY AS NEEDED FOR ANXIETY/INOMNIA (Patient taking differently: Take 0.5 mg by mouth at bedtime as needed for anxiety (sleep). ) 60 tablet 2  . FLUoxetine (PROZAC) 20 MG tablet TAKE 1 TABLET BY MOUTH DAILY 30 tablet 11  . Fluticasone Furoate-Vilanterol (BREO ELLIPTA) 200-25 MCG/INH AEPB Inhale 1 puff into the lungs daily. 30 each 11  . hydroxypropyl methylcellulose / hypromellose (ISOPTO TEARS / GONIOVISC) 2.5 % ophthalmic solution Place 1 drop into both eyes daily as needed for dry eyes.    Marland Kitchen KLOR-CON 8 MEQ tablet Take 8 mEq by mouth daily.    Marland Kitchen loratadine (CLARITIN) 10 MG tablet Take 1 tablet (10 mg total) by mouth every morning. 100 tablet 3  . midodrine (PROAMATINE) 10 MG tablet Take 1 tablet (10 mg total) by mouth 3 (three) times daily. 90 tablet 5  . Omega-3 Fatty Acids (FISH OIL) 1000 MG CAPS Take 1 capsule by mouth every morning.     Marland Kitchen omeprazole (PRILOSEC) 20 MG capsule TAKE 2 CAPSULES BY MOUTH EVERY DAY 180 capsule 3  . traMADol (ULTRAM) 50 MG tablet Take 0.5-1 tablets (25-50 mg total) by mouth every 6 (six) hours as needed. (Patient taking differently: Take 50 mg by mouth 2 (two) times daily. ) 120 tablet 3  . KLOR-CON 8 MEQ tablet TAKE 1 TABLET EVERY MORNING 30 tablet 5   No facility-administered medications prior to visit.     ROS Review of Systems  Constitutional:  Positive for fatigue. Negative for activity change, appetite change, chills and unexpected weight change.  HENT: Negative for congestion, mouth sores and sinus pressure.   Eyes: Negative for visual disturbance.  Respiratory: Negative for cough and chest tightness.   Cardiovascular: Negative for palpitations.  Gastrointestinal: Negative for abdominal pain and nausea.  Genitourinary: Negative for difficulty urinating, frequency and vaginal pain.  Musculoskeletal: Positive for arthralgias and back pain. Negative for gait problem.  Skin: Negative for pallor and rash.  Neurological: Positive for dizziness and weakness. Negative for tremors, numbness and headaches.  Psychiatric/Behavioral: Positive for decreased concentration. Negative for confusion, sleep disturbance and suicidal ideas.    Objective:  BP 110/68 (BP Location: Left Arm, Patient Position: Sitting, Cuff Size: Large)   Pulse 84   Temp 97.5 F (36.4 C) (Oral)   Ht 5\' 4"  (1.626 m)   Wt 212 lb (96.2 kg)   SpO2 98%   BMI 36.39 kg/m   BP Readings from Last 3 Encounters:  04/02/16 110/68  02/17/16 130/80  02/10/16 130/82    Wt Readings from Last 3 Encounters:  04/02/16 212 lb (96.2 kg)  03/02/16 228 lb (103.4 kg)  02/17/16 228 lb 2 oz (103.5 kg)    Physical Exam  Constitutional: She appears well-developed. No distress.  HENT:  Head: Normocephalic.  Right Ear: External ear normal.  Left Ear: External ear normal.  Nose: Nose normal.  Mouth/Throat: Oropharynx is clear and moist.  Eyes: Conjunctivae are normal. Pupils are equal, round, and reactive to light. Right eye exhibits no discharge. Left eye exhibits no discharge.  Neck: Normal range of motion. Neck supple. No JVD present. No tracheal deviation present. No thyromegaly present.  Cardiovascular: Normal rate, regular rhythm and normal heart sounds.   Pulmonary/Chest: No stridor. No respiratory distress. She has no wheezes.  Abdominal: Soft. Bowel sounds are normal.  She exhibits no distension and no mass. There is no tenderness. There is no rebound and no guarding.  Musculoskeletal: She exhibits no edema or tenderness.  Lymphadenopathy:    She has no cervical adenopathy.  Neurological: She displays normal reflexes. No cranial nerve deficit. She exhibits normal muscle tone. Coordination normal.  Skin: No rash noted. No erythema.  Psychiatric: She has a normal mood and affect. Her behavior is normal. Judgment and thought content normal.    Lab Results  Component Value Date   WBC 4.1 01/19/2016   HGB 13.7 01/19/2016   HCT 43.2 01/19/2016   PLT 151 01/19/2016   GLUCOSE 95 02/10/2016   CHOL 270 (H) 06/12/2014   TRIG 132.0 06/12/2014   HDL 52.40 06/12/2014   LDLDIRECT 159.1 05/16/2012   LDLCALC 191 (H) 06/12/2014   ALT 14 12/25/2015   AST 15 12/25/2015   NA 139 02/10/2016   K 4.5 02/10/2016   CL 101 02/10/2016   CREATININE 0.96 02/10/2016   BUN 15 02/10/2016   CO2 33 (H) 02/10/2016   TSH 2.703 01/19/2016   INR 1.07 09/06/2011   HGBA1C 5.6 12/25/2015    Mr Brain Wo Contrast  Result Date: 03/02/2016 CLINICAL DATA:  82 year old female with hypotension for the past year. No known injury. Initial encounter. EXAM: MRI HEAD WITHOUT CONTRAST TECHNIQUE: Multiplanar, multiecho pulse sequences of the brain and surrounding structures were obtained without intravenous contrast. COMPARISON:  No comparison MR.  Comparison head CT 05/10/2010. FINDINGS: Brain: No acute infarct or intracranial hemorrhage. Mild chronic microvascular changes. Moderate global atrophy. Ventricular prominence probably secondary to central atrophy rather than hydrocephalus. No intracranial mass lesion noted on this unenhanced exam. Prominent arachnoid granulation with bone remodeling right frontal region unchanged. Hyperostosis frontalis interna incidentally noted. Vascular: Major intracranial vascular structures are patent. Skull and upper cervical spine: Mild cervical spondylotic  changes C4-5 incompletely assessed. Sinuses/Orbits: No acute orbital abnormality. Post lens replacement. Minimal mucosal thickening ethmoid sinus air cells. Other: Negative IMPRESSION: No acute infarct or intracranial hemorrhage. Mild chronic microvascular changes. Moderate global atrophy. Ventricular prominence probably secondary to central atrophy rather than hydrocephalus. No intracranial mass lesion noted on this unenhanced exam. Mild cervical spondylotic changes C4-5 incompletely assessed. Electronically Signed   By: Genia Del M.D.   On: 03/02/2016 11:02    Assessment & Plan:   There are no diagnoses linked to this encounter. I am having Ms. Beirne maintain her aspirin, Fish Oil, Cholecalciferol, B Complex-C (SUPER B COMPLEX PO), fluticasone furoate-vilanterol, loratadine, traMADol, clonazePAM, KLOR-CON, hydroxypropyl methylcellulose / hypromellose, midodrine, omeprazole, and FLUoxetine.  No orders of the defined types were placed in this encounter.    Follow-up: No Follow-up on file.  [name], MD

## 2016-04-02 NOTE — Assessment & Plan Note (Signed)
Indapamide po (stopped) 

## 2016-04-02 NOTE — Assessment & Plan Note (Signed)
Increase Fluoxetine

## 2016-04-02 NOTE — Assessment & Plan Note (Signed)
Better Wt Readings from Last 3 Encounters:  04/02/16 212 lb (96.2 kg)  03/02/16 228 lb (103.4 kg)  02/17/16 228 lb 2 oz (103.5 kg)

## 2016-04-02 NOTE — Assessment & Plan Note (Signed)
Better on Midodrin

## 2016-04-02 NOTE — Assessment & Plan Note (Signed)
On CPAP. ?

## 2016-04-12 DIAGNOSIS — J449 Chronic obstructive pulmonary disease, unspecified: Secondary | ICD-10-CM | POA: Diagnosis not present

## 2016-04-12 DIAGNOSIS — I872 Venous insufficiency (chronic) (peripheral): Secondary | ICD-10-CM | POA: Diagnosis not present

## 2016-04-12 DIAGNOSIS — I951 Orthostatic hypotension: Secondary | ICD-10-CM | POA: Diagnosis not present

## 2016-04-12 DIAGNOSIS — G4733 Obstructive sleep apnea (adult) (pediatric): Secondary | ICD-10-CM | POA: Diagnosis not present

## 2016-04-15 ENCOUNTER — Emergency Department (HOSPITAL_COMMUNITY): Payer: Medicare Other

## 2016-04-15 ENCOUNTER — Inpatient Hospital Stay (HOSPITAL_COMMUNITY)
Admission: EM | Admit: 2016-04-15 | Discharge: 2016-04-20 | DRG: 470 | Disposition: A | Discharge status: Skilled Nursing Facility | Payer: Medicare Other | Attending: Internal Medicine | Admitting: Internal Medicine

## 2016-04-15 ENCOUNTER — Encounter (HOSPITAL_COMMUNITY): Payer: Self-pay | Admitting: *Deleted

## 2016-04-15 DIAGNOSIS — Z7982 Long term (current) use of aspirin: Secondary | ICD-10-CM

## 2016-04-15 DIAGNOSIS — M25559 Pain in unspecified hip: Secondary | ICD-10-CM | POA: Diagnosis not present

## 2016-04-15 DIAGNOSIS — Y92099 Unspecified place in other non-institutional residence as the place of occurrence of the external cause: Secondary | ICD-10-CM

## 2016-04-15 DIAGNOSIS — Z9119 Patient's noncompliance with other medical treatment and regimen: Secondary | ICD-10-CM | POA: Diagnosis not present

## 2016-04-15 DIAGNOSIS — J449 Chronic obstructive pulmonary disease, unspecified: Secondary | ICD-10-CM | POA: Diagnosis present

## 2016-04-15 DIAGNOSIS — Z9989 Dependence on other enabling machines and devices: Secondary | ICD-10-CM | POA: Diagnosis not present

## 2016-04-15 DIAGNOSIS — M79604 Pain in right leg: Secondary | ICD-10-CM | POA: Diagnosis not present

## 2016-04-15 DIAGNOSIS — I517 Cardiomegaly: Secondary | ICD-10-CM | POA: Diagnosis not present

## 2016-04-15 DIAGNOSIS — M810 Age-related osteoporosis without current pathological fracture: Secondary | ICD-10-CM | POA: Diagnosis present

## 2016-04-15 DIAGNOSIS — W010XXA Fall on same level from slipping, tripping and stumbling without subsequent striking against object, initial encounter: Secondary | ICD-10-CM | POA: Diagnosis present

## 2016-04-15 DIAGNOSIS — S72011A Unspecified intracapsular fracture of right femur, initial encounter for closed fracture: Secondary | ICD-10-CM | POA: Diagnosis not present

## 2016-04-15 DIAGNOSIS — R079 Chest pain, unspecified: Secondary | ICD-10-CM | POA: Diagnosis not present

## 2016-04-15 DIAGNOSIS — Z7951 Long term (current) use of inhaled steroids: Secondary | ICD-10-CM

## 2016-04-15 DIAGNOSIS — Z8249 Family history of ischemic heart disease and other diseases of the circulatory system: Secondary | ICD-10-CM | POA: Diagnosis not present

## 2016-04-15 DIAGNOSIS — R410 Disorientation, unspecified: Secondary | ICD-10-CM | POA: Diagnosis not present

## 2016-04-15 DIAGNOSIS — J4540 Moderate persistent asthma, uncomplicated: Secondary | ICD-10-CM | POA: Diagnosis present

## 2016-04-15 DIAGNOSIS — S299XXA Unspecified injury of thorax, initial encounter: Secondary | ICD-10-CM | POA: Diagnosis not present

## 2016-04-15 DIAGNOSIS — R0902 Hypoxemia: Secondary | ICD-10-CM | POA: Diagnosis not present

## 2016-04-15 DIAGNOSIS — Z6836 Body mass index (BMI) 36.0-36.9, adult: Secondary | ICD-10-CM

## 2016-04-15 DIAGNOSIS — R9389 Abnormal findings on diagnostic imaging of other specified body structures: Secondary | ICD-10-CM

## 2016-04-15 DIAGNOSIS — G4733 Obstructive sleep apnea (adult) (pediatric): Secondary | ICD-10-CM

## 2016-04-15 DIAGNOSIS — K219 Gastro-esophageal reflux disease without esophagitis: Secondary | ICD-10-CM | POA: Diagnosis present

## 2016-04-15 DIAGNOSIS — Z820 Family history of epilepsy and other diseases of the nervous system: Secondary | ICD-10-CM

## 2016-04-15 DIAGNOSIS — Z833 Family history of diabetes mellitus: Secondary | ICD-10-CM

## 2016-04-15 DIAGNOSIS — I130 Hypertensive heart and chronic kidney disease with heart failure and stage 1 through stage 4 chronic kidney disease, or unspecified chronic kidney disease: Secondary | ICD-10-CM | POA: Diagnosis present

## 2016-04-15 DIAGNOSIS — I5032 Chronic diastolic (congestive) heart failure: Secondary | ICD-10-CM | POA: Diagnosis not present

## 2016-04-15 DIAGNOSIS — S72009A Fracture of unspecified part of neck of unspecified femur, initial encounter for closed fracture: Secondary | ICD-10-CM | POA: Diagnosis present

## 2016-04-15 DIAGNOSIS — S72041A Displaced fracture of base of neck of right femur, initial encounter for closed fracture: Secondary | ICD-10-CM | POA: Diagnosis not present

## 2016-04-15 DIAGNOSIS — S728X9A Other fracture of unspecified femur, initial encounter for closed fracture: Secondary | ICD-10-CM | POA: Diagnosis not present

## 2016-04-15 DIAGNOSIS — N189 Chronic kidney disease, unspecified: Secondary | ICD-10-CM | POA: Diagnosis not present

## 2016-04-15 DIAGNOSIS — Z9181 History of falling: Secondary | ICD-10-CM | POA: Diagnosis not present

## 2016-04-15 DIAGNOSIS — S3993XA Unspecified injury of pelvis, initial encounter: Secondary | ICD-10-CM | POA: Diagnosis not present

## 2016-04-15 DIAGNOSIS — Z96641 Presence of right artificial hip joint: Secondary | ICD-10-CM | POA: Diagnosis not present

## 2016-04-15 DIAGNOSIS — Z823 Family history of stroke: Secondary | ICD-10-CM

## 2016-04-15 DIAGNOSIS — E785 Hyperlipidemia, unspecified: Secondary | ICD-10-CM | POA: Diagnosis not present

## 2016-04-15 DIAGNOSIS — T148XXA Other injury of unspecified body region, initial encounter: Secondary | ICD-10-CM | POA: Diagnosis not present

## 2016-04-15 DIAGNOSIS — S72001A Fracture of unspecified part of neck of right femur, initial encounter for closed fracture: Secondary | ICD-10-CM | POA: Diagnosis not present

## 2016-04-15 DIAGNOSIS — W19XXXA Unspecified fall, initial encounter: Secondary | ICD-10-CM | POA: Diagnosis not present

## 2016-04-15 DIAGNOSIS — Z471 Aftercare following joint replacement surgery: Secondary | ICD-10-CM | POA: Diagnosis not present

## 2016-04-15 DIAGNOSIS — I951 Orthostatic hypotension: Secondary | ICD-10-CM | POA: Diagnosis not present

## 2016-04-15 DIAGNOSIS — S72001D Fracture of unspecified part of neck of right femur, subsequent encounter for closed fracture with routine healing: Secondary | ICD-10-CM | POA: Diagnosis not present

## 2016-04-15 DIAGNOSIS — F309 Manic episode, unspecified: Secondary | ICD-10-CM | POA: Diagnosis not present

## 2016-04-15 DIAGNOSIS — Z87891 Personal history of nicotine dependence: Secondary | ICD-10-CM | POA: Diagnosis not present

## 2016-04-15 DIAGNOSIS — I10 Essential (primary) hypertension: Secondary | ICD-10-CM | POA: Diagnosis not present

## 2016-04-15 LAB — BASIC METABOLIC PANEL
Anion gap: 5 (ref 5–15)
BUN: 15 mg/dL (ref 6–20)
CO2: 28 mmol/L (ref 22–32)
Calcium: 8.4 mg/dL — ABNORMAL LOW (ref 8.9–10.3)
Chloride: 106 mmol/L (ref 101–111)
Creatinine, Ser: 0.95 mg/dL (ref 0.44–1.00)
GFR calc Af Amer: >60 mL/min (ref >60)
GFR calc non Af Amer: 55 mL/min — ABNORMAL LOW (ref >60)
Glucose, Bld: 108 mg/dL — ABNORMAL HIGH (ref 65–99)
Potassium: 4 mmol/L (ref 3.5–5.1)
Sodium: 139 mmol/L (ref 135–145)

## 2016-04-15 LAB — CBC WITH DIFFERENTIAL/PLATELET
Basophils Absolute: 0 10*3/uL (ref 0.0–0.1)
Basophils Relative: 1 %
Eosinophils Absolute: 0.1 10*3/uL (ref 0.0–0.7)
Eosinophils Relative: 2 %
HCT: 44.6 % (ref 36.0–46.0)
Hemoglobin: 14.2 g/dL (ref 12.0–15.0)
Lymphocytes Relative: 20 %
Lymphs Abs: 1.3 10*3/uL (ref 0.7–4.0)
MCH: 29.2 pg (ref 26.0–34.0)
MCHC: 31.8 g/dL (ref 30.0–36.0)
MCV: 91.8 fL (ref 78.0–100.0)
Monocytes Absolute: 0.5 10*3/uL (ref 0.1–1.0)
Monocytes Relative: 8 %
Neutro Abs: 4.6 10*3/uL (ref 1.7–7.7)
Neutrophils Relative %: 69 %
Platelets: 151 10*3/uL (ref 150–400)
RBC: 4.86 MIL/uL (ref 3.87–5.11)
RDW: 13 % (ref 11.5–15.5)
WBC: 6.6 10*3/uL (ref 4.0–10.5)

## 2016-04-15 LAB — URINALYSIS, ROUTINE W REFLEX MICROSCOPIC
Bilirubin Urine: NEGATIVE
Glucose, UA: NEGATIVE mg/dL
Hgb urine dipstick: NEGATIVE
Ketones, ur: NEGATIVE mg/dL
Leukocytes, UA: NEGATIVE
Nitrite: NEGATIVE
Protein, ur: NEGATIVE mg/dL
Specific Gravity, Urine: 1.008 (ref 1.005–1.030)
pH: 6.5 (ref 5.0–8.0)

## 2016-04-15 MED ORDER — IPRATROPIUM-ALBUTEROL 0.5-2.5 (3) MG/3ML IN SOLN
3.0000 mL | Freq: Once | RESPIRATORY_TRACT | Status: AC
  Administered 2016-04-15: 3 mL via RESPIRATORY_TRACT
  Filled 2016-04-15: qty 3

## 2016-04-15 MED ORDER — PANTOPRAZOLE SODIUM 40 MG PO TBEC
40.0000 mg | DELAYED_RELEASE_TABLET | Freq: Every day | ORAL | Status: DC
  Administered 2016-04-16: 40 mg via ORAL
  Filled 2016-04-15: qty 1

## 2016-04-15 MED ORDER — ACETAMINOPHEN 10 MG/ML IV SOLN
1000.0000 mg | Freq: Four times a day (QID) | INTRAVENOUS | Status: DC
  Administered 2016-04-15: 1000 mg via INTRAVENOUS
  Filled 2016-04-15: qty 100

## 2016-04-15 MED ORDER — ACETAMINOPHEN 325 MG PO TABS
650.0000 mg | ORAL_TABLET | Freq: Four times a day (QID) | ORAL | Status: DC | PRN
  Administered 2016-04-15 – 2016-04-16 (×2): 650 mg via ORAL
  Filled 2016-04-15 (×4): qty 2

## 2016-04-15 MED ORDER — ALBUTEROL SULFATE (2.5 MG/3ML) 0.083% IN NEBU
2.5000 mg | INHALATION_SOLUTION | RESPIRATORY_TRACT | Status: DC | PRN
Start: 2016-04-15 — End: 2016-04-15

## 2016-04-15 MED ORDER — HYDROMORPHONE HCL 1 MG/ML IJ SOLN
1.0000 mg | Freq: Once | INTRAMUSCULAR | Status: AC
  Administered 2016-04-15: 1 mg via INTRAVENOUS
  Filled 2016-04-15: qty 1

## 2016-04-15 MED ORDER — ONDANSETRON HCL 4 MG/2ML IJ SOLN
4.0000 mg | Freq: Once | INTRAMUSCULAR | Status: AC
  Administered 2016-04-15: 4 mg via INTRAVENOUS
  Filled 2016-04-15: qty 2

## 2016-04-15 MED ORDER — POLYVINYL ALCOHOL 1.4 % OP SOLN
1.0000 [drp] | Freq: Every day | OPHTHALMIC | Status: DC | PRN
  Filled 2016-04-15: qty 15

## 2016-04-15 MED ORDER — TRAMADOL HCL 50 MG PO TABS
50.0000 mg | ORAL_TABLET | Freq: Two times a day (BID) | ORAL | Status: DC
  Administered 2016-04-15 – 2016-04-20 (×9): 50 mg via ORAL
  Filled 2016-04-15 (×9): qty 1

## 2016-04-15 MED ORDER — MIDODRINE HCL 5 MG PO TABS
10.0000 mg | ORAL_TABLET | Freq: Three times a day (TID) | ORAL | Status: DC
  Administered 2016-04-16 – 2016-04-20 (×11): 10 mg via ORAL
  Filled 2016-04-15 (×15): qty 2

## 2016-04-15 MED ORDER — FLUOXETINE HCL 20 MG PO CAPS
40.0000 mg | ORAL_CAPSULE | Freq: Every day | ORAL | Status: DC
  Administered 2016-04-16 – 2016-04-20 (×4): 40 mg via ORAL
  Filled 2016-04-15 (×4): qty 2

## 2016-04-15 MED ORDER — IPRATROPIUM-ALBUTEROL 0.5-2.5 (3) MG/3ML IN SOLN: 3.0000 mL | RESPIRATORY_TRACT | Status: DC | PRN

## 2016-04-15 MED ORDER — POTASSIUM CHLORIDE ER 8 MEQ PO TBCR
8.0000 meq | EXTENDED_RELEASE_TABLET | Freq: Every day | ORAL | Status: DC
  Filled 2016-04-15: qty 1

## 2016-04-15 MED ORDER — LORATADINE 10 MG PO TABS
10.0000 mg | ORAL_TABLET | Freq: Every morning | ORAL | Status: DC
  Administered 2016-04-16 – 2016-04-20 (×4): 10 mg via ORAL
  Filled 2016-04-15 (×4): qty 1

## 2016-04-15 MED ORDER — SODIUM CHLORIDE 0.9 % IV SOLN
INTRAVENOUS | Status: DC
  Administered 2016-04-15 – 2016-04-17 (×4): via INTRAVENOUS

## 2016-04-15 MED ORDER — ONDANSETRON HCL 4 MG/2ML IJ SOLN: 4.0000 mg | Freq: Three times a day (TID) | INTRAMUSCULAR | Status: AC | PRN

## 2016-04-15 MED ORDER — CLONAZEPAM 0.5 MG PO TABS
0.5000 mg | ORAL_TABLET | Freq: Every evening | ORAL | Status: DC | PRN
  Administered 2016-04-16 – 2016-04-19 (×3): 0.5 mg via ORAL
  Filled 2016-04-15 (×3): qty 1

## 2016-04-15 NOTE — ED Triage Notes (Signed)
Per EMS, pt fell trying to get out of wheelchair. Daughter states the pt has orthostatic hypotension and has hx of falls due to orthostatic hypotension. Pt complains of pain in her right upper thigh. Pt received 152mcg fentanyl. BP 170/90, HR 84.

## 2016-04-15 NOTE — ED Notes (Signed)
Admitting MD at bedside.

## 2016-04-15 NOTE — H&P (Addendum)
History and Physical    Sabrina Page [name] DOB: 04-Feb-1934 DOA: 04/15/2016  PCP: [name], MD   Patient coming from: Home  Chief Complaint: Fall, right hip pain  HPI: Sabrina Page is a 82 y.o. woman with a known history of orthostatic hypotension, HTN, HLD, COPD, asthma, and GERD who had an accidental fall this morning in her apartment.  She speaks [place], and her daughter is at bedside to interpret (formal interpreter services were declined).  The patient denies any preceding aura, headache, chest pain.  She recalls slipping (she was attempting to ambulate in compression hose) while trying to sit down at the table.  She denies loss of consciousness.  She admits that she will get dizzy when she is orthostatic.  Reportedly, her BP at home this morning went from 120/80 when sitting to 70/50 when standing.  She has had shortness of breath for the past two days, which she attributes to her asthma.  She is not on home oxygen, but she uses CPAP at night for OSA.  She does not use a nebulizer machine at home.  No nausea, vomiting, or diarrhea.  No fever.  No cough or congestion.  No antibiotics or hospitalizations in the past 30 days.  She has had her flu shot this season.  ED Course: Xray shows subcapital fracture of the right hip without dislocation.  Chest xray limited by quality, but no overt edema or infiltrates.  EKG does not show acute ST segment changes.  [place] has been consulted.  Hospitalist asked to admit.  Review of Systems: As per HPI otherwise 10 point review of systems negative.    Past Medical History:  Diagnosis Date  . Allergy    rhinitis  . Asthma   . Chronic kidney disease    hx of cystitis   . COPD (chronic obstructive pulmonary disease) ([place])   . Depression   . Dizziness    vertigo   . Gallstones   . GERD (gastroesophageal reflux disease)   . Hyperlipidemia   . Hypertension   . Obesity   . Osteoarthritis   .  Osteoporosis   . Peripheral vascular disease (HCC)    swelling   . Pneumonia   . Shortness of breath    with exertion   . Shoulder injury    left  . Sleep apnea    never diagnosed   . Urinary incontinence     Past Surgical History:  Procedure Laterality Date  . CHOLECYSTECTOMY  09/14/2011   Procedure: LAPAROSCOPIC CHOLECYSTECTOMY;  Surgeon: Odis Hollingshead, MD;  Location: [facility];  Service: General;  Laterality: N/A;  attempted intraoperative cholangiogram   . L TKR    . LIVER BIOPSY  09/14/2011   Procedure: LIVER BIOPSY;  Surgeon: Odis Hollingshead, MD;  Location: [facility];  Service: General;  Laterality: N/A;  . OTHER SURGICAL HISTORY     left leg surgery   . OTHER SURGICAL HISTORY     left breast surgery due to mastitis   . right left knee arthroplast       reports that she quit smoking about 14 years ago. Her smoking use included Cigarettes. She has a 17.50 pack-year smoking history. She has never used smokeless tobacco. She reports that she drinks alcohol. She reports that she does not use drugs.  She is married and still lives independently with her husband.  Allergies  Allergen Reactions  . Atorvastatin     REACTION: upset stomach  . Enalapril Maleate   .  Hctz [Hydrochlorothiazide]     Dizzy   . Lovastatin     REACTION: tongue stress  . Simvastatin     REACTION: mouth sores  . Topamax [Topiramate]     syncope    Family History  Problem Relation Age of Onset  . Hypertension Mother   . Arthritis Mother   . Diabetes Mother   . Stroke Mother   . Stroke Father   . Hypertension Father   . Hypertension Other   . CAD Neg Hx      Prior to Admission medications   Medication Sig Start Date End Date Taking? Authorizing Provider  aspirin 81 MG tablet Take 81 mg by mouth every morning.    Yes Historical Provider, MD  B Complex-C (SUPER B COMPLEX PO) Take 1 tablet by mouth every morning.    Yes Historical Provider, MD  Cholecalciferol 1000 UNITS capsule Take 1,000  Units by mouth every morning.    Yes Historical Provider, MD  clonazePAM (KLONOPIN) 0.5 MG tablet TAKE 1 TABLET BY MOUTH TWICE A DAY AS NEEDED FOR ANXIETY/INOMNIA Patient taking differently: Take 0.5 mg by mouth at bedtime as needed for anxiety (sleep).  12/10/15  Yes Evie Lacks Plotnikov, MD  FLUoxetine (PROZAC) 40 MG capsule Take 1 capsule (40 mg total) by mouth daily. 04/02/16  Yes Evie Lacks Plotnikov, MD  hydroxypropyl methylcellulose / hypromellose (ISOPTO TEARS / GONIOVISC) 2.5 % ophthalmic solution Place 1 drop into both eyes daily as needed for dry eyes.   Yes Historical Provider, MD  KLOR-CON 8 MEQ tablet Take 8 mEq by mouth daily. 01/06/16  Yes Historical Provider, MD  loratadine (CLARITIN) 10 MG tablet Take 1 tablet (10 mg total) by mouth every morning. 06/26/15  Yes Evie Lacks Plotnikov, MD  midodrine (PROAMATINE) 10 MG tablet Take 1 tablet (10 mg total) by mouth 3 (three) times daily. 02/10/16  Yes Evie Lacks Plotnikov, MD  Omega-3 Fatty Acids (FISH OIL) 1000 MG CAPS Take 1 capsule by mouth every morning.    Yes Historical Provider, MD  omeprazole (PRILOSEC) 20 MG capsule TAKE 2 CAPSULES BY MOUTH EVERY DAY Patient taking differently: TAKE 40 MG BY MOUTH DAILY 03/19/16  Yes Evie Lacks Plotnikov, MD  traMADol (ULTRAM) 50 MG tablet Take 0.5-1 tablets (25-50 mg total) by mouth every 6 (six) hours as needed. Patient taking differently: Take 50 mg by mouth 2 (two) times daily.  09/23/15  Yes Cassandria Anger, MD    Physical Exam: Vitals:   04/15/16 1515 04/15/16 1743 04/15/16 1810  BP: 179/91  177/78  Pulse: 67 70 68  Resp: 18 17 18   Temp: 98.1 F (36.7 C)    TempSrc: Oral    SpO2: 92% 99% 100%      Constitutional: NAD, calm, comfortable Vitals:   04/15/16 1515 04/15/16 1743 04/15/16 1810  BP: 179/91  177/78  Pulse: 67 70 68  Resp: 18 17 18   Temp: 98.1 F (36.7 C)    TempSrc: Oral    SpO2: 92% 99% 100%   Eyes: PERRL, lids and conjunctivae normal ENMT: Mucous membranes are  moist. Posterior pharynx clear of any exudate or lesions. Normal dentition.  Neck: normal appearance, supple Respiratory: clear to auscultation bilaterally, no wheezing, no crackles. Normal respiratory effort. No accessory muscle use.  Cardiovascular: Normal rate, regular rhythm, no murmurs / rubs / gallops. No extremity edema. 2+ pedal pulses.  GI: abdomen is soft and compressible.  No distention.  No tenderness.  No masses palpated.  Bowel sounds  are present. Musculoskeletal:  No joint deformity in upper and lower extremities. Reduced ROM in right leg only.  No contractures. Normal muscle tone.  Skin: no rashes, warm and dry Neurologic: No focal deficits. Psychiatric: Normal judgment and insight. Alert and oriented x 3. Normal mood.     Labs on Admission: I have personally reviewed following labs and imaging studies  CBC:  Recent Labs Lab 04/15/16 1555  WBC 6.6  NEUTROABS 4.6  HGB 14.2  HCT 44.6  MCV 91.8  PLT 123XX123   Basic Metabolic Panel:  Recent Labs Lab 04/15/16 1555  NA 139  K 4.0  CL 106  CO2 28  GLUCOSE 108*  BUN 15  CREATININE 0.95  CALCIUM 8.4*   GFR: Estimated Creatinine Clearance: 53.2 mL/min (by C-G formula based on SCr of 0.95 mg/dL).  Urine analysis:    Component Value Date/Time   COLORURINE YELLOW 04/15/2016 [place] 04/15/2016 1545   LABSPEC 1.008 04/15/2016 1545   PHURINE 6.5 04/15/2016 1545   GLUCOSEU NEGATIVE 04/15/2016 1545   GLUCOSEU NEGATIVE 06/26/2015 0930   HGBUR NEGATIVE 04/15/2016 1545   BILIRUBINUR NEGATIVE 04/15/2016 1545   KETONESUR NEGATIVE 04/15/2016 1545   PROTEINUR NEGATIVE 04/15/2016 1545   UROBILINOGEN 0.2 06/26/2015 0930   NITRITE NEGATIVE 04/15/2016 1545   LEUKOCYTESUR NEGATIVE 04/15/2016 1545   Radiological Exams on Admission: Dg Chest 1 View  Result Date: 04/15/2016 CLINICAL DATA:  Pain after trauma EXAM: CHEST 1 VIEW COMPARISON:  January 18, 2016 FINDINGS: The heart size is more prominent the  interval, probably due to the portable technique. The mediastinum is also more prominent. Increased interstitial prominence without overt edema. No focal infiltrate. No other acute abnormalities. IMPRESSION: The heart appears more prominent as does the mediastinum. This very well may be due to the low volume portable technique. Recommend a PA and lateral chest x-ray before discharge. Increased interstitial prominence could represent pulmonary venous congestion versus technical differences as well. No overt edema. Electronically Signed   By: Dorise Bullion III M.D   On: 04/15/2016 17:16   Dg Pelvis 1-2 Views  Result Date: 04/15/2016 CLINICAL DATA:  Patient fell out of wheelchair. EXAM: PELVIS - 1-2 VIEW COMPARISON:  None. FINDINGS: There is a right hip fracture. IMPRESSION: There is a right hip fracture incompletely evaluated. Electronically Signed   By: Dorise Bullion III M.D   On: 04/15/2016 17:14   Dg Femur Min 2 Views Right  Result Date: 04/15/2016 CLINICAL DATA:  Pain after trauma EXAM: RIGHT FEMUR 2 VIEWS COMPARISON:  None FINDINGS: There is a subcapital right hip fracture with no dislocation. No other abnormalities. IMPRESSION: There is a subcapital right hip fracture with no dislocation. Electronically Signed   By: Dorise Bullion III M.D   On: 04/15/2016 17:15    EKG: Independently reviewed. Noted above.  Assessment/Plan Principal Problem:   Closed right hip fracture (HCC) Active Problems:   Asthma, moderate persistent, well-controlled   OSA on CPAP   Morbid obesity (HCC)   Orthostatic hypotension   Hip fracture (HCC)      Right hip fracture, accidental fall reported but also known orthostasis --Telemetry monitoring for now.  If no events, can likely transition off post-operatively (patient's daughter really wants her mother to be on the orthopedic floor after surgery) --Orthopedic PA has been to the bedside tonight.  Anticipate surgery on Saturday with Dr. Alvan Dame.  The patient  will need an NPO after midnight order placed tomorrow.  Heart Healthy diet for now. --  Will check coags, type and scree, hepatic function panel in the morning --Analgesics as needed --Fall precautions --SCDs for now --HOLD aspirin  Shortness of breath --Needs PA and lateral chest xray at some point --Duonebs prn for now --She does not appear decompensated at this time; continue to observe  OSA --CPAP qHS per home regimen  Orthostatic hypotension --NS at 75cc/hr --Continue midodrine 10mg  TID --Document orthostatics in the morning after volume resuscitation --Fall precautions  Of note, she is on a potassium supplement at home which I have continued for now.  Other vitamin supplements have been held.  DVT prophylaxis: SCDs Code Status: FULL Family Communication: Daughter at bedside. Disposition Plan: To be determined. Consults called: [place] Admission status: Inpatient, telemetry   TIME SPENT: 60 minutes   Eber Jones MD [facility] Pager [phone]  If 7PM-7AM, please contact night-coverage [email or web address] Password [facility]  04/15/2016, 7:47 PM

## 2016-04-15 NOTE — H&P (Signed)
Patient ID: Sabrina Page MRN: [id number] DOB/AGE: [birth year] 82 y.o.  Admit date: 04/15/2016  Admission Diagnoses:  Active Problems:   Closed right hip fracture (HCC)   HPI: Very pleasant 82 year old female pt with a past med hx of COPD, CKD, Hypo and Hypertension.  The pt is [place] and does not speak good [place].  The pt is accompanied by her daughter in the ED.  The daughter said she went by the pts home this am to take her to a family Thanksgiving dinner.  The pt said she did not feel up to going.  The daughter said she came back by and found her mom on the floor after a fall.  The daughter said she had been there for 2 hours.  The pt reports acute pain of her right hip.  The daughter said the mom has had "parkinsons like" symptoms recently and she wonders if that contributed to her fall. She also is concerned about her mothers severe orthostatic hypotension.  The daughter reports the mom needed a hip replacement but it has not been done because her mom was a poor surgical candidate.  Past Medical History: Past Medical History:  Diagnosis Date  . Allergy    rhinitis  . Asthma   . Chronic kidney disease    hx of cystitis   . COPD (chronic obstructive pulmonary disease) ([place])   . Depression   . Dizziness    vertigo   . Gallstones   . GERD (gastroesophageal reflux disease)   . Hyperlipidemia   . Hypertension   . Obesity   . Osteoarthritis   . Osteoporosis   . Peripheral vascular disease (HCC)    swelling   . Pneumonia   . Shortness of breath    with exertion   . Shoulder injury    left  . Sleep apnea    never diagnosed   . Urinary incontinence     Surgical History: Past Surgical History:  Procedure Laterality Date  . CHOLECYSTECTOMY  09/14/2011   Procedure: LAPAROSCOPIC CHOLECYSTECTOMY;  Surgeon: Odis Hollingshead, MD;  Location: [facility];  Service: General;  Laterality: N/A;  attempted intraoperative cholangiogram   . L TKR    . LIVER BIOPSY  09/14/2011    Procedure: LIVER BIOPSY;  Surgeon: Odis Hollingshead, MD;  Location: [facility];  Service: General;  Laterality: N/A;  . OTHER SURGICAL HISTORY     left leg surgery   . OTHER SURGICAL HISTORY     left breast surgery due to mastitis   . right left knee arthroplast      Family History: Family History  Problem Relation Age of Onset  . Hypertension Mother   . Arthritis Mother   . Diabetes Mother   . Stroke Mother   . Stroke Father   . Hypertension Father   . Hypertension Other   . CAD Neg Hx     Social History: Social History   Social History  . Marital status: Married    Spouse name: N/A  . Number of children: 2  . Years of education: N/A   Occupational History  . Retired    Social History Main Topics  . Smoking status: Former Smoker    Packs/day: 0.50    Years: 35.00    Types: Cigarettes    Quit date: 05/24/2001  . Smokeless tobacco: Never Used  . Alcohol use Yes     Comment: socially   . Drug use: No  . Sexual  activity: Not Currently   Other Topics Concern  . Not on file   Social History Narrative   Lives with husband in an apartment on the 3rd floor.  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  Has 2 children.  Education: college.      Allergies: Atorvastatin; Enalapril maleate; Hctz [hydrochlorothiazide]; Lovastatin; Simvastatin; and Topamax [topiramate]  Medications: I have reviewed the patient's current medications.  Vital Signs: Patient Vitals for the past 24 hrs:  BP Temp Temp src Pulse Resp SpO2  04/15/16 1810 177/78 - - 68 18 100 %  04/15/16 1743 - - - 70 17 99 %  04/15/16 1515 179/91 98.1 F (36.7 C) Oral 67 18 92 %    Radiology: Dg Chest 1 View  Result Date: 04/15/2016 CLINICAL DATA:  Pain after trauma EXAM: CHEST 1 VIEW COMPARISON:  January 18, 2016 FINDINGS: The heart size is more prominent the interval, probably due to the portable technique. The mediastinum is also more prominent. Increased interstitial prominence without overt edema. No focal  infiltrate. No other acute abnormalities. IMPRESSION: The heart appears more prominent as does the mediastinum. This very well may be due to the low volume portable technique. Recommend a PA and lateral chest x-ray before discharge. Increased interstitial prominence could represent pulmonary venous congestion versus technical differences as well. No overt edema. Electronically Signed   By: Dorise Bullion III M.D   On: 04/15/2016 17:16   Dg Pelvis 1-2 Views  Result Date: 04/15/2016 CLINICAL DATA:  Patient fell out of wheelchair. EXAM: PELVIS - 1-2 VIEW COMPARISON:  None. FINDINGS: There is a right hip fracture. IMPRESSION: There is a right hip fracture incompletely evaluated. Electronically Signed   By: Dorise Bullion III M.D   On: 04/15/2016 17:14   Dg Femur Min 2 Views Right  Result Date: 04/15/2016 CLINICAL DATA:  Pain after trauma EXAM: RIGHT FEMUR 2 VIEWS COMPARISON:  None FINDINGS: There is a subcapital right hip fracture with no dislocation. No other abnormalities. IMPRESSION: There is a subcapital right hip fracture with no dislocation. Electronically Signed   By: Dorise Bullion III M.D   On: 04/15/2016 17:15    Labs:  Recent Labs  04/15/16 1555  WBC 6.6  RBC 4.86  HCT 44.6  PLT 151    Recent Labs  04/15/16 1555  NA 139  K 4.0  CL 106  CO2 28  BUN 15  CREATININE 0.95  GLUCOSE 108*  CALCIUM 8.4*   No results for input(s): LABPT, INR in the last 72 hours.  Review of Systems: ROS  Physical Exam: Neurologically in tact Sensation in tact b/l Movement to b/l feet Movement of pelvis causes severe pain TTP of the right hip  Assessment and Plan: Pt is being admitted to the hospital Dr. Rolena Infante consulted with Dr. Alvan Dame Surgical intervention with Dr Alvan Dame is planned for Saturday Pt will be NPO after midnight tomorrow Elicited and answered question for pt and daughter Daughter would like the Anesthesiologist to consider Epidural for [place] for Melina Schools, MD [place] [phone]  Agree with above Case discussed with Dr Alvan Dame  Will defer definitive fracture management to him Medical admission - defer management of non-orthopedic issues

## 2016-04-15 NOTE — ED Provider Notes (Signed)
[facility] DEPT Provider Note   CSN: [id number] Arrival date & time: 04/15/16  1501     History   Chief Complaint Chief Complaint  Patient presents with  . Fall  . Leg Pain    HPI Sabrina Page is a 82 y.o. female.  82 year old Caucasian female with a past medical history significant for COPD, orthostatic hypotension that presents to the ED today by EMS from home after a fall out of her wheelchair. Daughter is at bedside states the patient lives at home with her husband in a independent living facility. She states patient has a history of orthostatic hypotension and has frequent falls due to standing up too quickly. Patient states she was trying to get out of her wheelchair today and fell. Patient states that she slipped to the ground and landed on her right hip. She denies hitting her head. She denies any loc, ha, or neck pain. She complains of pain in her right upper thigh. She denies any dizziness, lightheadedness, vertigo-like symptoms prior to the episode. Daughter states the patient has been feeling weak for the past 2 days. Her daughter feels this is related to her orthostatic hypotension. Unclear on reason for fall. She denies any fever, chills, headache, vision changes, chest pain, shortness of breath, abdominal pain, nausea, emesis, urinary symptoms, change in bowel habits, numbness/tingling.      Past Medical History:  Diagnosis Date  . Allergy    rhinitis  . Asthma   . Chronic kidney disease    hx of cystitis   . COPD (chronic obstructive pulmonary disease) ([place])   . Depression   . Dizziness    vertigo   . Gallstones   . GERD (gastroesophageal reflux disease)   . Hyperlipidemia   . Hypertension   . Obesity   . Osteoarthritis   . Osteoporosis   . Peripheral vascular disease (HCC)    swelling   . Pneumonia   . Shortness of breath    with exertion   . Shoulder injury    left  . Sleep apnea    never diagnosed   . Urinary incontinence      Patient Active Problem List   Diagnosis Date Noted  . Near syncope 01/18/2016  . Orthostatic hypotension 01/18/2016  . Hip pain, chronic 12/25/2015  . Carotid bruit 06/26/2015  . Fall at home 02/01/2015  . Concussion without loss of consciousness 09/13/2014  . Morbid obesity ([place]) 03/06/2014  . Right tennis elbow 01/02/2014  . Wart 01/02/2014  . Chronic fatigue 06/25/2013  . External hemorrhoid, bleeding 02/14/2013  . OSA on CPAP 02/10/2012  . LBP radiating to both legs 05/14/2011  . Falls frequently 05/14/2011  . Fatigue 04/22/2011  . Hyperglycemia 04/22/2011  . Neoplasm of uncertain behavior of skin 01/11/2011  . Anxiety state 07/06/2010  . SHOULDER PAIN 05/21/2010  . CONCUSSION WITH LOSS OF CONSCIOUSNESS 05/21/2010  . Actinic keratosis 04/08/2010  . CARPAL TUNNEL SYNDROME 11/28/2009  . NECK PAIN 11/28/2009  . DIZZINESS 11/28/2009  . INSOMNIA, CHRONIC 06/23/2009  . TOBACCO USE, QUIT 04/11/2009  . DIVERTICULOSIS OF COLON 09/06/2008  . DIVERTICULITIS OF COLON 09/06/2008  . Unspecified chest pain 09/06/2008  . GERD 06/05/2008  . OSTEOARTHRITIS 06/05/2008  . ABNORMAL GLUCOSE NEC 06/05/2008  . BRONCHITIS, ACUTE 04/04/2008  . APHTHOUS STOMATITIS 03/25/2008  . Venous (peripheral) insufficiency 11/03/2007  . Edema 11/03/2007  . SHINGLES 10/23/2007  . DENTAL PAIN 10/23/2007  . PARESTHESIA 10/23/2007  . Pain in limb 07/06/2007  . HYPERLIPIDEMIA 02/17/2007  .  Depression 02/17/2007  . Essential hypertension 02/17/2007  . ALLERGIC RHINITIS 02/17/2007  . Asthma, moderate persistent, well-controlled 02/17/2007  . Dyspnea on exertion 02/17/2007  . SYMPTOM, INCONTINENCE, URGE 02/17/2007    Past Surgical History:  Procedure Laterality Date  . CHOLECYSTECTOMY  09/14/2011   Procedure: LAPAROSCOPIC CHOLECYSTECTOMY;  Surgeon: Odis Hollingshead, MD;  Location: [facility];  Service: General;  Laterality: N/A;  attempted intraoperative cholangiogram   . L TKR    . LIVER BIOPSY   09/14/2011   Procedure: LIVER BIOPSY;  Surgeon: Odis Hollingshead, MD;  Location: [facility];  Service: General;  Laterality: N/A;  . OTHER SURGICAL HISTORY     left leg surgery   . OTHER SURGICAL HISTORY     left breast surgery due to mastitis   . right left knee arthroplast      OB History    No data available       Home Medications    Prior to Admission medications   Medication Sig Start Date End Date Taking? Authorizing Provider  aspirin 81 MG tablet Take 81 mg by mouth every morning.     Historical Provider, MD  B Complex-C (SUPER B COMPLEX PO) Take 1 tablet by mouth every morning.     Historical Provider, MD  Cholecalciferol 1000 UNITS capsule Take 1,000 Units by mouth every morning.     Historical Provider, MD  clonazePAM (KLONOPIN) 0.5 MG tablet TAKE 1 TABLET BY MOUTH TWICE A DAY AS NEEDED FOR ANXIETY/INOMNIA Patient taking differently: Take 0.5 mg by mouth at bedtime as needed for anxiety (sleep).  12/10/15   Evie Lacks Plotnikov, MD  FLUoxetine (PROZAC) 40 MG capsule Take 1 capsule (40 mg total) by mouth daily. 04/02/16   Evie Lacks Plotnikov, MD  Fluticasone Furoate-Vilanterol (BREO ELLIPTA) 200-25 MCG/INH AEPB Inhale 1 puff into the lungs daily. 12/26/14   Janith Lima, MD  hydroxypropyl methylcellulose / hypromellose (ISOPTO TEARS / GONIOVISC) 2.5 % ophthalmic solution Place 1 drop into both eyes daily as needed for dry eyes.    Historical Provider, MD  KLOR-CON 8 MEQ tablet Take 8 mEq by mouth daily. 01/06/16   Historical Provider, MD  loratadine (CLARITIN) 10 MG tablet Take 1 tablet (10 mg total) by mouth every morning. 06/26/15   Evie Lacks Plotnikov, MD  midodrine (PROAMATINE) 10 MG tablet Take 1 tablet (10 mg total) by mouth 3 (three) times daily. 02/10/16   Evie Lacks Plotnikov, MD  Omega-3 Fatty Acids (FISH OIL) 1000 MG CAPS Take 1 capsule by mouth every morning.     Historical Provider, MD  omeprazole (PRILOSEC) 20 MG capsule TAKE 2 CAPSULES BY MOUTH EVERY DAY 03/19/16   Evie Lacks Plotnikov, MD  traMADol (ULTRAM) 50 MG tablet Take 0.5-1 tablets (25-50 mg total) by mouth every 6 (six) hours as needed. Patient taking differently: Take 50 mg by mouth 2 (two) times daily.  09/23/15   Cassandria Anger, MD    Family History Family History  Problem Relation Age of Onset  . Hypertension Mother   . Arthritis Mother   . Diabetes Mother   . Stroke Mother   . Stroke Father   . Hypertension Father   . Hypertension Other   . CAD Neg Hx     Social History Social History  Substance Use Topics  . Smoking status: Former Smoker    Packs/day: 0.50    Years: 35.00    Types: Cigarettes    Quit date: 05/24/2001  . Smokeless tobacco:  Never Used  . Alcohol use Yes     Comment: socially      Allergies   Atorvastatin; Enalapril maleate; Hctz [hydrochlorothiazide]; Lovastatin; Simvastatin; and Topamax [topiramate]   Review of Systems Review of Systems  Constitutional: Negative for chills and fever.  HENT: Negative for ear pain and sore throat.   Eyes: Negative for pain and visual disturbance.  Respiratory: Negative for cough and shortness of breath.   Cardiovascular: Negative for chest pain and palpitations.  Gastrointestinal: Negative for abdominal pain and vomiting.  Genitourinary: Negative for dysuria, frequency, hematuria and urgency.  Musculoskeletal: Positive for arthralgias. Negative for back pain, neck pain and neck stiffness.  Skin: Negative for color change and rash.  Neurological: Positive for weakness. Negative for dizziness, seizures, light-headedness, numbness and headaches.  All other systems reviewed and are negative.    Physical Exam Updated Vital Signs BP 179/91 (BP Location: Right Arm)   Pulse 70   Temp 98.1 F (36.7 C) (Oral)   Resp 17   SpO2 99%   Physical Exam  Constitutional: She is oriented to person, place, and time. She appears well-developed and well-nourished. No distress.  HENT:  Head: Normocephalic and atraumatic.   Mouth/Throat: Uvula is midline, oropharynx is clear and moist and mucous membranes are normal.  Eyes: Conjunctivae and EOM are normal. Pupils are equal, round, and reactive to light. Right eye exhibits no discharge. Left eye exhibits no discharge. No scleral icterus.  Neck: Normal range of motion. Neck supple. No thyromegaly present.  Patient for admission cervical spine. No C-spine midline tenderness. No deformities or step-offs noted.  Cardiovascular: Normal rate, regular rhythm, normal heart sounds and intact distal pulses.  Exam reveals no gallop and no friction rub.   No murmur heard. Pulmonary/Chest: Effort normal and breath sounds normal. No respiratory distress. She has no wheezes.  Abdominal: Soft. Bowel sounds are normal. She exhibits no distension. There is no tenderness. There is no rebound and no guarding.  Musculoskeletal: Normal range of motion.  Patient is tender to palpation over the right SI joint. No erythema, edema, ecchymosis, deformity, crepitus noted. Unable to assess full range of motion due to the pain. DP pulses are 2+ bilaterally. Cap refills normal. Sensation intact. Patient has strength 5 out of 5 in left lower extremity and 4 out of 5 in the right lower extremity due to the pain. Patient with full range of motion of the right knee without any crepitus, deformity, or pain noted.  Patient has no midline L-spine and T-spine midline tenderness. No deformities or step-offs noted.  Lymphadenopathy:    She has no cervical adenopathy.  Neurological: She is alert and oriented to person, place, and time.  The patient is alert, attentive, and oriented x 3. Speech is clear. Cranial nerve II-VII grossly intact. Negative pronator drift. Sensation intact. Strength 5/5 in upper extremities. Reflexes 2+ and symmetric at biceps, triceps, knees, and ankles. Rapid alternating movement and fine finger movements intact. Unable to assess romberg or gait due to fracture.   Skin: Skin is  warm and dry. Capillary refill takes less than 2 seconds.  Nursing note and vitals reviewed.    ED Treatments / Results  Labs (all labs ordered are listed, but only abnormal results are displayed) Labs Reviewed  BASIC METABOLIC PANEL - Abnormal; Notable for the following:       Result Value   Glucose, Bld 108 (*)    Calcium 8.4 (*)    GFR calc non Af Amer 55 (*)  All other components within normal limits  CBC WITH DIFFERENTIAL/PLATELET  URINALYSIS, ROUTINE W REFLEX MICROSCOPIC (NOT AT [facility])    EKG  EKG Interpretation None       Radiology Dg Chest 1 View  Result Date: 04/15/2016 CLINICAL DATA:  Pain after trauma EXAM: CHEST 1 VIEW COMPARISON:  January 18, 2016 FINDINGS: The heart size is more prominent the interval, probably due to the portable technique. The mediastinum is also more prominent. Increased interstitial prominence without overt edema. No focal infiltrate. No other acute abnormalities. IMPRESSION: The heart appears more prominent as does the mediastinum. This very well may be due to the low volume portable technique. Recommend a PA and lateral chest x-ray before discharge. Increased interstitial prominence could represent pulmonary venous congestion versus technical differences as well. No overt edema. Electronically Signed   By: Dorise Bullion III M.D   On: 04/15/2016 17:16   Dg Pelvis 1-2 Views  Result Date: 04/15/2016 CLINICAL DATA:  Patient fell out of wheelchair. EXAM: PELVIS - 1-2 VIEW COMPARISON:  None. FINDINGS: There is a right hip fracture. IMPRESSION: There is a right hip fracture incompletely evaluated. Electronically Signed   By: Dorise Bullion III M.D   On: 04/15/2016 17:14   Dg Femur Min 2 Views Right  Result Date: 04/15/2016 CLINICAL DATA:  Pain after trauma EXAM: RIGHT FEMUR 2 VIEWS COMPARISON:  None FINDINGS: There is a subcapital right hip fracture with no dislocation. No other abnormalities. IMPRESSION: There is a subcapital right hip  fracture with no dislocation. Electronically Signed   By: Dorise Bullion III M.D   On: 04/15/2016 17:15    Procedures Procedures (including critical care time)  Medications Ordered in ED Medications  acetaminophen (OFIRMEV) IV 1,000 mg (1,000 mg Intravenous New Bag/Given 04/15/16 1757)  ondansetron (ZOFRAN) injection 4 mg (not administered)  albuterol (PROVENTIL) (2.5 MG/3ML) 0.083% nebulizer solution 2.5 mg (not administered)  ondansetron (ZOFRAN) injection 4 mg (4 mg Intravenous Given 04/15/16 1526)  HYDROmorphone (DILAUDID) injection 1 mg (1 mg Intravenous Given 04/15/16 1622)  ipratropium-albuterol (DUONEB) 0.5-2.5 (3) MG/3ML nebulizer solution 3 mL (3 mLs Nebulization Given 04/15/16 1743)     Initial Impression / Assessment and Plan / ED Course  I have reviewed the triage vital signs and the nursing notes.  Pertinent labs & imaging results that were available during my care of the patient were reviewed by me and considered in my medical decision making (see chart for details).  Clinical Course   Patient presents to the ED after a fall with right hip pain. Fall likely due to patient's orthostatic hypotension. Labs have been unremarkable. Her EKG was unremarkable and showed sinus rhythm. UA was without signs of UTI. X-ray of right hip shows a subcapital right hip fracture without dislocation. All other labs are unremarkable. EKG has been unremarkable. CXR shows ncreased interstitial prominence could represent pulmonary venous congestion versus technical differences as well. No overt edema. Patient will need upright chest x-ray when stable from hip fracture. Pain is controlled in the ED. She is hemodynamically stable at this time. Spoke with Dr. Rolena Infante with [facility] orthopedics who will perform surgery tomorrow patient nothing by mouth after midnight tonight. Will call for admission to hospital medicine. Patient was seen and examined by Dr. Vallery Ridge who is agreeable to the above plan.  Spoke with Dr. Eulas Post who agrees to admit the patient. She is hemodynamically stable at this time and agreeable to admission.  Final Clinical Impressions(s) / ED Diagnoses   Final diagnoses:  Closed fracture of right hip, initial encounter [facility])  Fall, initial encounter    New Prescriptions New Prescriptions   No medications on file     Doristine Devoid, PA-C 04/15/16 [place], MD 04/15/16 2328

## 2016-04-15 NOTE — ED Notes (Signed)
Bed: [id number] Expected date:  Expected time:  Means of arrival:  Comments: EMS- 82yo F, fall/hip injury?

## 2016-04-16 DIAGNOSIS — W19XXXA Unspecified fall, initial encounter: Secondary | ICD-10-CM

## 2016-04-16 LAB — COMPREHENSIVE METABOLIC PANEL
ALT: 22 U/L (ref 14–54)
AST: 26 U/L (ref 15–41)
Albumin: 3.7 g/dL (ref 3.5–5.0)
Alkaline Phosphatase: 48 U/L (ref 38–126)
Anion gap: 5 (ref 5–15)
BUN: 16 mg/dL (ref 6–20)
CO2: 28 mmol/L (ref 22–32)
Calcium: 8.5 mg/dL — ABNORMAL LOW (ref 8.9–10.3)
Chloride: 104 mmol/L (ref 101–111)
Creatinine, Ser: 0.93 mg/dL (ref 0.44–1.00)
GFR calc Af Amer: >60 mL/min (ref >60)
GFR calc non Af Amer: 57 mL/min — ABNORMAL LOW (ref >60)
Glucose, Bld: 127 mg/dL — ABNORMAL HIGH (ref 65–99)
Potassium: 4.3 mmol/L (ref 3.5–5.1)
Sodium: 137 mmol/L (ref 135–145)
Total Bilirubin: 0.9 mg/dL (ref 0.3–1.2)
Total Protein: 6.3 g/dL — ABNORMAL LOW (ref 6.5–8.1)

## 2016-04-16 LAB — TYPE AND SCREEN
ABO/RH(D): B POS
Antibody Screen: NEGATIVE

## 2016-04-16 LAB — PROTIME-INR
INR: 1.07
Prothrombin Time: 13.9 seconds (ref 11.4–15.2)

## 2016-04-16 LAB — APTT: aPTT: 29 seconds (ref 24–36)

## 2016-04-16 MED ORDER — KETOROLAC TROMETHAMINE 15 MG/ML IJ SOLN
15.0000 mg | Freq: Four times a day (QID) | INTRAMUSCULAR | Status: DC | PRN
  Administered 2016-04-16 – 2016-04-19 (×3): 15 mg via INTRAVENOUS
  Filled 2016-04-16 (×4): qty 1

## 2016-04-16 MED ORDER — HYDROMORPHONE HCL 1 MG/ML IJ SOLN
1.0000 mg | INTRAMUSCULAR | Status: DC | PRN
  Administered 2016-04-16 (×4): 1 mg via INTRAVENOUS
  Filled 2016-04-16 (×4): qty 1

## 2016-04-16 MED ORDER — CHLORHEXIDINE GLUCONATE 4 % EX LIQD
60.0000 mL | Freq: Once | CUTANEOUS | Status: AC
  Administered 2016-04-17: 4 via TOPICAL
  Filled 2016-04-16: qty 60

## 2016-04-16 MED ORDER — POTASSIUM CHLORIDE CRYS ER 10 MEQ PO TBCR
10.0000 meq | EXTENDED_RELEASE_TABLET | Freq: Every day | ORAL | Status: DC
  Administered 2016-04-16 – 2016-04-20 (×4): 10 meq via ORAL
  Filled 2016-04-16 (×9): qty 1

## 2016-04-16 MED ORDER — ONDANSETRON HCL 4 MG/2ML IJ SOLN
4.0000 mg | Freq: Once | INTRAMUSCULAR | Status: AC
  Administered 2016-04-16: 4 mg via INTRAVENOUS
  Filled 2016-04-16: qty 2

## 2016-04-16 MED ORDER — CEFAZOLIN SODIUM-DEXTROSE 2-4 GM/100ML-% IV SOLN
2.0000 g | INTRAVENOUS | Status: DC
  Filled 2016-04-16: qty 100

## 2016-04-16 MED ORDER — ENSURE ENLIVE PO LIQD
237.0000 mL | ORAL | Status: DC
  Administered 2016-04-16 – 2016-04-18 (×2): 237 mL via ORAL

## 2016-04-16 NOTE — Progress Notes (Signed)
Initial Nutrition Assessment  DOCUMENTATION CODES:   Obesity unspecified  INTERVENTION:  - Will order Ensure Enlive once/day, this supplement provides 350 kcal and 20 grams of protein. - Encourage PO intakes of meals and supplement. - RD will follow-up 11/27.  NUTRITION DIAGNOSIS:   Unintentional weight loss related to acute illness as evidenced by percent weight loss.  GOAL:   Patient will meet greater than or equal to 90% of their needs  MONITOR:   PO intake, Supplement acceptance, Weight trends, Labs, I & O's  REASON FOR ASSESSMENT:   Consult Hip fracture protocol  ASSESSMENT:   82 y.o. woman with a known history of orthostatic hypotension, HTN, HLD, COPD, asthma, and GERD who had an accidental fall this morning in her apartment.  She speaks [place], and her daughter is at bedside to interpret (formal interpreter services were declined).  The patient denies any preceding aura, headache, chest pain.  She recalls slipping (she was attempting to ambulate in compression hose) while trying to sit down at the table.  She denies loss of consciousness.  She admits that she will get dizzy when she is orthostatic.  Reportedly, her BP at home this morning went from 120/80 when sitting to 70/50 when standing.  Pt seen per protocol. BMI indicates obesity. No intakes documented since admission. Unable to talk with pt x2; will follow-up Monday. Noted that pt's preferred language is [place] but that family is able to interpret. Plan per notes is for R arthroplasty tomorrow (11/25) and pt to be NPO at midnight tonight in anticipation.  Unable to perform physical assessment at this time but will do so at follow-up. Per chart review, pt has lost 16 lbs (7% body weight) in the past 1 month which is significant for time frame. Will order Ensure Enlive once/day at this time and adjust as needed at follow-up.  Medications reviewed; 40 mg oral Protonix/day, 10 mEq oral KCl/day.  Labs reviewed; Ca:  8.5 mg/dL.   IVF: NS @ 75 mL/hr.    Diet Order:  Diet Heart Room service appropriate? Yes; Fluid consistency: Thin Diet NPO time specified  Skin:  Reviewed, no issues  Last BM:  PTA/unknown  Height:   Ht Readings from Last 1 Encounters:  04/02/16 5\' 4"  (1.626 m)    Weight:   Wt Readings from Last 1 Encounters:  04/02/16 212 lb (96.2 kg)    Ideal Body Weight:  54.54 kg  BMI:  There is no height or weight on file to calculate BMI.  Estimated Nutritional Needs:   Kcal:  1445-1635 (15-17 kcal/kg)  Protein:  65-75 grams  Fluid:  >/= 1.6 L/day  EDUCATION NEEDS:   No education needs identified at this time    Jarome Matin, MS, RD, LDN, CNSC Inpatient Clinical Dietitian Pager # [phone] After hours/weekend pager # [phone]

## 2016-04-16 NOTE — Progress Notes (Signed)
Patient ID: Sabrina Page, female   DOB: 1933/12/23, 82 y.o.   MRN: [id number]  Displaced right femoral neck fracture Will need hemiarthroplasty  NPO After midnight for OR tomorrow

## 2016-04-16 NOTE — Progress Notes (Signed)
Patient was nauseated and had an episode of emesis.  PCP was notified

## 2016-04-16 NOTE — Progress Notes (Signed)
Sabrina Page  MRN: [id number] DOB/Age: 10/15/1933 82 y.o. Physician: Alvan Dame Procedure:      Subjective:  Patient and family in room to interpret. Multiple questions  Vital Signs Temp:  [98.1 F (36.7 C)-98.9 F (37.2 C)] 98.2 F (36.8 C) (11/24 0900) Pulse Rate:  [67-76] 76 (11/24 0900) Resp:  [12-20] 20 (11/24 0900) BP: (117-179)/(62-91) 117/62 (11/24 0900) SpO2:  [92 %-100 %] 100 % (11/24 0006)  Lab Results  Recent Labs  04/15/16 1555  WBC 6.6  HGB 14.2  HCT 44.6  PLT 151   BMET  Recent Labs  04/15/16 1555 04/16/16 0338  NA 139 137  K 4.0 4.3  CL 106 104  CO2 28 28  GLUCOSE 108* 127*  BUN 15 16  CREATININE 0.95 0.93  CALCIUM 8.4* 8.5*   INR  Date Value Ref Range Status  04/16/2016 1.07  Final     Exam NVI to right LE        Plan NPO after midnight for right hip hemi with Dr. Alvan Dame tomorrow  [facility] PA-C   04/16/2016, 9:52 AM Contact # [phone]

## 2016-04-16 NOTE — Progress Notes (Signed)
PROGRESS NOTE    Sabrina Page  [name] DOB: 1933-07-02 DOA: 04/15/2016 PCP: [name], MD    Brief Narrative:  82 y.o. woman with a known history of orthostatic hypotension, HTN, HLD, COPD, asthma, and GERD who had an accidental fall this morning in her apartment.  She speaks [place], and her daughter is at bedside to interpret (formal interpreter services were declined).  The patient denies any preceding aura, headache, chest pain.  She recalls slipping (she was attempting to ambulate in compression hose) while trying to sit down at the table.  She denies loss of consciousness.  She admits that she will get dizzy when she is orthostatic.  Reportedly, her BP at home this morning went from 120/80 when sitting to 70/50 when standing  Assessment & Plan:   Principal Problem:   Closed right hip fracture (HCC) Active Problems:   Asthma, moderate persistent, well-controlled   OSA on CPAP   Morbid obesity (HCC)   Orthostatic hypotension   Hip fracture (HCC)   Right hip fracture, accidental fall reported but also known orthostasis -Continue analgesics as needed -Orthopedic surgery consulted. Recommendations noted for surgery on 04/17/2016  Shortness of breath Stable at present She is continued on when necessary DuoNeb's We'll continue oxygen on as needed basis  OSA Patient to continue daily at bedtime CPAP  Orthostatic hypotension Patient is continued on normal saline 75 mL per hour --Continue midodrine 10mg  TID   DVT prophylaxis: SCDs Code Status: Full code Family Communication: Patient's daughter at bedside Disposition Plan: Uncertain at this time, likely will need skilled nursing facility at times discharge  Consultants:   Orthopedic surgery  Procedures:     Antimicrobials: Anti-infectives    Start     Dose/Rate Route Frequency Ordered Stop   04/16/16 1045  ceFAZolin (ANCEF) IVPB 2g/100 mL premix     2 g 200 mL/hr over 30 Minutes Intravenous  On call to O.R. 04/16/16 1031 04/17/16 0559       Subjective: No complaints at present  Objective: Vitals:   04/16/16 0528 04/16/16 0900 04/16/16 1339 04/16/16 1747  BP: 132/73 117/62 (!) 145/91 (!) 146/95  Pulse: 73 76 79 83  Resp: 16 20 20 20   Temp: 98.1 F (36.7 C) 98.2 F (36.8 C) 98.4 F (36.9 C) 98.9 F (37.2 C)  TempSrc: Oral Oral Oral Oral  SpO2:        Intake/Output Summary (Last 24 hours) at 04/16/16 1852 Last data filed at 04/16/16 0600  Gross per 24 hour  Intake           688.75 ml  Output              175 ml  Net           513.75 ml   There were no vitals filed for this visit.  Examination:  General exam: Appears calm and comfortable  Respiratory system: Clear to auscultation. Respiratory effort normal. Cardiovascular system: S1 & S2 heard, RRR. No JVD, murmurs, rubs, gallops or clicks. No pedal edema. Gastrointestinal system: Abdomen is nondistended, soft and nontender. No organomegaly or masses felt. Normal bowel sounds heard. Central nervous system: Alert and oriented. No focal neurological deficits. Extremities: Symmetric 5 x 5 power. Skin: No rashes, lesions or ulcers Psychiatry: Judgement and insight appear normal. Mood & affect appropriate.   Data Reviewed: I have personally reviewed following labs and imaging studies  CBC:  Recent Labs Lab 04/15/16 1555  WBC 6.6  NEUTROABS 4.6  HGB 14.2  HCT 44.6  MCV 91.8  PLT 123XX123   Basic Metabolic Panel:  Recent Labs Lab 04/15/16 1555 04/16/16 0338  NA 139 137  K 4.0 4.3  CL 106 104  CO2 28 28  GLUCOSE 108* 127*  BUN 15 16  CREATININE 0.95 0.93  CALCIUM 8.4* 8.5*   GFR: CrCl cannot be calculated (Unknown ideal weight.). Liver Function Tests:  Recent Labs Lab 04/16/16 0338  AST 26  ALT 22  ALKPHOS 48  BILITOT 0.9  PROT 6.3*  ALBUMIN 3.7   No results for input(s): LIPASE, AMYLASE in the last 168 hours. No results for input(s): AMMONIA in the last 168 hours. Coagulation  Profile:  Recent Labs Lab 04/16/16 0338  INR 1.07   Cardiac Enzymes: No results for input(s): CKTOTAL, CKMB, CKMBINDEX, TROPONINI in the last 168 hours. BNP (last 3 results) No results for input(s): PROBNP in the last 8760 hours. HbA1C: No results for input(s): HGBA1C in the last 72 hours. CBG: No results for input(s): GLUCAP in the last 168 hours. Lipid Profile: No results for input(s): CHOL, HDL, LDLCALC, TRIG, CHOLHDL, LDLDIRECT in the last 72 hours. Thyroid Function Tests: No results for input(s): TSH, T4TOTAL, FREET4, T3FREE, THYROIDAB in the last 72 hours. Anemia Panel: No results for input(s): VITAMINB12, FOLATE, FERRITIN, TIBC, IRON, RETICCTPCT in the last 72 hours. Sepsis Labs: No results for input(s): PROCALCITON, LATICACIDVEN in the last 168 hours.  No results found for this or any previous visit (from the past 240 hour(s)).   Radiology Studies: Dg Chest 1 View  Result Date: 04/15/2016 CLINICAL DATA:  Pain after trauma EXAM: CHEST 1 VIEW COMPARISON:  January 18, 2016 FINDINGS: The heart size is more prominent the interval, probably due to the portable technique. The mediastinum is also more prominent. Increased interstitial prominence without overt edema. No focal infiltrate. No other acute abnormalities. IMPRESSION: The heart appears more prominent as does the mediastinum. This very well may be due to the low volume portable technique. Recommend a PA and lateral chest x-ray before discharge. Increased interstitial prominence could represent pulmonary venous congestion versus technical differences as well. No overt edema. Electronically Signed   By: Dorise Bullion III M.D   On: 04/15/2016 17:16   Dg Pelvis 1-2 Views  Result Date: 04/15/2016 CLINICAL DATA:  Patient fell out of wheelchair. EXAM: PELVIS - 1-2 VIEW COMPARISON:  None. FINDINGS: There is a right hip fracture. IMPRESSION: There is a right hip fracture incompletely evaluated. Electronically Signed   By: Dorise Bullion III M.D   On: 04/15/2016 17:14   Dg Femur Min 2 Views Right  Result Date: 04/15/2016 CLINICAL DATA:  Pain after trauma EXAM: RIGHT FEMUR 2 VIEWS COMPARISON:  None FINDINGS: There is a subcapital right hip fracture with no dislocation. No other abnormalities. IMPRESSION: There is a subcapital right hip fracture with no dislocation. Electronically Signed   By: Dorise Bullion III M.D   On: 04/15/2016 17:15    Scheduled Meds: .  ceFAZolin (ANCEF) IV  2 g Intravenous On Call to OR  . chlorhexidine  60 mL Topical Once  . feeding supplement (ENSURE ENLIVE)  237 mL Oral Q24H  . FLUoxetine  40 mg Oral Daily  . loratadine  10 mg Oral q morning - 10a  . midodrine  10 mg Oral TID WC  . pantoprazole  40 mg Oral Daily  . potassium chloride  10 mEq Oral Daily  . traMADol  50 mg Oral BID   Continuous Infusions: . sodium  chloride 75 mL/hr at 04/16/16 0939     LOS: 1 day   CHIU, Orpah Melter, MD [facility] Pager [phone]  If 7PM-7AM, please contact night-coverage [email or web address] Password [facility] 04/16/2016, 6:52 PM

## 2016-04-17 ENCOUNTER — Inpatient Hospital Stay (HOSPITAL_COMMUNITY): Payer: Medicare Other | Admitting: Anesthesiology

## 2016-04-17 ENCOUNTER — Encounter (HOSPITAL_COMMUNITY)
Admission: EM | Disposition: A | Discharge status: Skilled Nursing Facility | Payer: Self-pay | Source: Home / Self Care | Attending: Internal Medicine

## 2016-04-17 ENCOUNTER — Encounter (HOSPITAL_COMMUNITY): Payer: Self-pay | Admitting: Anesthesiology

## 2016-04-17 ENCOUNTER — Inpatient Hospital Stay (HOSPITAL_COMMUNITY): Payer: Medicare Other

## 2016-04-17 HISTORY — PX: HIP ARTHROPLASTY: SHX981

## 2016-04-17 LAB — SURGICAL PCR SCREEN
MRSA, PCR: NEGATIVE
Staphylococcus aureus: NEGATIVE

## 2016-04-17 SURGERY — HEMIARTHROPLASTY, HIP, DIRECT ANTERIOR APPROACH, FOR FRACTURE
Anesthesia: General | Site: Hip | Laterality: Right

## 2016-04-17 MED ORDER — HYDROCODONE-ACETAMINOPHEN 5-325 MG PO TABS
1.0000 | ORAL_TABLET | Freq: Four times a day (QID) | ORAL | Status: DC | PRN
  Administered 2016-04-18 – 2016-04-19 (×3): 2 via ORAL
  Filled 2016-04-17 (×3): qty 2

## 2016-04-17 MED ORDER — ONDANSETRON HCL 4 MG/2ML IJ SOLN
INTRAMUSCULAR | Status: DC | PRN
  Administered 2016-04-17: 4 mg via INTRAVENOUS

## 2016-04-17 MED ORDER — SUGAMMADEX SODIUM 200 MG/2ML IV SOLN
INTRAVENOUS | Status: AC
  Filled 2016-04-17: qty 2

## 2016-04-17 MED ORDER — LORAZEPAM 2 MG/ML IJ SOLN
2.0000 mg | Freq: Once | INTRAMUSCULAR | Status: AC
  Administered 2016-04-17: 2 mg via INTRAVENOUS
  Filled 2016-04-17: qty 1

## 2016-04-17 MED ORDER — FENTANYL CITRATE (PF) 100 MCG/2ML IJ SOLN
INTRAMUSCULAR | Status: AC
  Filled 2016-04-17: qty 4

## 2016-04-17 MED ORDER — HYDROMORPHONE HCL 1 MG/ML IJ SOLN: 0.5000 mg | INTRAMUSCULAR | Status: DC | PRN

## 2016-04-17 MED ORDER — PROMETHAZINE HCL 25 MG/ML IJ SOLN: 6.2500 mg | INTRAMUSCULAR | Status: DC | PRN

## 2016-04-17 MED ORDER — ONDANSETRON HCL 4 MG/2ML IJ SOLN
INTRAMUSCULAR | Status: AC
  Filled 2016-04-17: qty 2

## 2016-04-17 MED ORDER — ACETAMINOPHEN 325 MG PO TABS
650.0000 mg | ORAL_TABLET | Freq: Four times a day (QID) | ORAL | Status: DC | PRN
Start: 2016-04-17 — End: 2016-04-20
  Administered 2016-04-18 – 2016-04-20 (×2): 650 mg via ORAL

## 2016-04-17 MED ORDER — ONDANSETRON HCL 4 MG/2ML IJ SOLN: 4.0000 mg | Freq: Four times a day (QID) | INTRAMUSCULAR | Status: DC | PRN

## 2016-04-17 MED ORDER — POLYETHYLENE GLYCOL 3350 17 G PO PACK
17.0000 g | PACK | Freq: Every day | ORAL | Status: DC | PRN
  Administered 2016-04-18: 17 g via ORAL
  Filled 2016-04-17: qty 1

## 2016-04-17 MED ORDER — METHOCARBAMOL 1000 MG/10ML IJ SOLN
500.0000 mg | Freq: Four times a day (QID) | INTRAVENOUS | Status: DC | PRN
  Filled 2016-04-17: qty 5

## 2016-04-17 MED ORDER — LIDOCAINE 2% (20 MG/ML) 5 ML SYRINGE
INTRAMUSCULAR | Status: DC | PRN
  Administered 2016-04-17: 100 mg via INTRAVENOUS

## 2016-04-17 MED ORDER — DOCUSATE SODIUM 100 MG PO CAPS
100.0000 mg | ORAL_CAPSULE | Freq: Two times a day (BID) | ORAL | Status: DC
  Administered 2016-04-17 – 2016-04-20 (×6): 100 mg via ORAL
  Filled 2016-04-17 (×6): qty 1

## 2016-04-17 MED ORDER — DEXAMETHASONE SODIUM PHOSPHATE 10 MG/ML IJ SOLN
INTRAMUSCULAR | Status: AC
Start: 2016-04-17 — End: 2016-04-17
  Filled 2016-04-17: qty 1

## 2016-04-17 MED ORDER — FENTANYL CITRATE (PF) 100 MCG/2ML IJ SOLN
INTRAMUSCULAR | Status: DC | PRN
  Administered 2016-04-17: 25 ug via INTRAVENOUS
  Administered 2016-04-17: 100 ug via INTRAVENOUS

## 2016-04-17 MED ORDER — ACETAMINOPHEN 650 MG RE SUPP: 650.0000 mg | Freq: Four times a day (QID) | RECTAL | Status: DC | PRN

## 2016-04-17 MED ORDER — PROPOFOL 10 MG/ML IV BOLUS
INTRAVENOUS | Status: DC | PRN
Start: 2016-04-17 — End: 2016-04-17
  Administered 2016-04-17: 110 mg via INTRAVENOUS

## 2016-04-17 MED ORDER — LACTATED RINGERS IV SOLN
INTRAVENOUS | Status: DC | PRN
  Administered 2016-04-17 (×2): via INTRAVENOUS

## 2016-04-17 MED ORDER — ACETAMINOPHEN 10 MG/ML IV SOLN
INTRAVENOUS | Status: AC
  Filled 2016-04-17: qty 100

## 2016-04-17 MED ORDER — FERROUS SULFATE 325 (65 FE) MG PO TABS
325.0000 mg | ORAL_TABLET | Freq: Three times a day (TID) | ORAL | Status: DC
  Administered 2016-04-18 – 2016-04-20 (×8): 325 mg via ORAL
  Filled 2016-04-17 (×8): qty 1

## 2016-04-17 MED ORDER — ONDANSETRON HCL 4 MG PO TABS: 4.0000 mg | ORAL_TABLET | Freq: Four times a day (QID) | ORAL | Status: DC | PRN

## 2016-04-17 MED ORDER — ASPIRIN EC 81 MG PO TBEC
81.0000 mg | DELAYED_RELEASE_TABLET | Freq: Two times a day (BID) | ORAL | Status: DC
Start: 2016-04-17 — End: 2016-04-20
  Administered 2016-04-17 – 2016-04-20 (×6): 81 mg via ORAL
  Filled 2016-04-17 (×6): qty 1

## 2016-04-17 MED ORDER — METOCLOPRAMIDE HCL 5 MG/ML IJ SOLN: 5.0000 mg | Freq: Three times a day (TID) | INTRAMUSCULAR | Status: DC | PRN

## 2016-04-17 MED ORDER — CEFAZOLIN SODIUM-DEXTROSE 2-3 GM-% IV SOLR
INTRAVENOUS | Status: DC | PRN
  Administered 2016-04-17: 2 g via INTRAVENOUS

## 2016-04-17 MED ORDER — ROCURONIUM BROMIDE 10 MG/ML (PF) SYRINGE
PREFILLED_SYRINGE | INTRAVENOUS | Status: DC | PRN
  Administered 2016-04-17: 20 mg via INTRAVENOUS

## 2016-04-17 MED ORDER — CEFAZOLIN IN D5W 1 GM/50ML IV SOLN
1.0000 g | Freq: Four times a day (QID) | INTRAVENOUS | Status: AC
  Administered 2016-04-17 – 2016-04-18 (×2): 1 g via INTRAVENOUS
  Filled 2016-04-17 (×2): qty 50

## 2016-04-17 MED ORDER — METOCLOPRAMIDE HCL 5 MG PO TABS: 5.0000 mg | ORAL_TABLET | Freq: Three times a day (TID) | ORAL | Status: DC | PRN

## 2016-04-17 MED ORDER — ROCURONIUM BROMIDE 50 MG/5ML IV SOSY
PREFILLED_SYRINGE | INTRAVENOUS | Status: AC
  Filled 2016-04-17: qty 5

## 2016-04-17 MED ORDER — ACETAMINOPHEN 10 MG/ML IV SOLN
INTRAVENOUS | Status: DC | PRN
  Administered 2016-04-17: 1000 mg via INTRAVENOUS

## 2016-04-17 MED ORDER — SUCCINYLCHOLINE CHLORIDE 200 MG/10ML IV SOSY
PREFILLED_SYRINGE | INTRAVENOUS | Status: DC | PRN
  Administered 2016-04-17: 100 mg via INTRAVENOUS

## 2016-04-17 MED ORDER — PHENYLEPHRINE 40 MCG/ML (10ML) SYRINGE FOR IV PUSH (FOR BLOOD PRESSURE SUPPORT)
PREFILLED_SYRINGE | INTRAVENOUS | Status: DC | PRN
  Administered 2016-04-17 (×2): 80 ug via INTRAVENOUS

## 2016-04-17 MED ORDER — SUCCINYLCHOLINE CHLORIDE 200 MG/10ML IV SOSY
PREFILLED_SYRINGE | INTRAVENOUS | Status: AC
  Filled 2016-04-17: qty 10

## 2016-04-17 MED ORDER — 0.9 % SODIUM CHLORIDE (POUR BTL) OPTIME
TOPICAL | Status: DC | PRN
  Administered 2016-04-17: 1000 mL

## 2016-04-17 MED ORDER — DEXAMETHASONE SODIUM PHOSPHATE 10 MG/ML IJ SOLN
INTRAMUSCULAR | Status: DC | PRN
  Administered 2016-04-17: 10 mg via INTRAVENOUS

## 2016-04-17 MED ORDER — PROPOFOL 10 MG/ML IV BOLUS
INTRAVENOUS | Status: AC
  Filled 2016-04-17: qty 20

## 2016-04-17 MED ORDER — ONDANSETRON HCL 4 MG/2ML IJ SOLN
4.0000 mg | Freq: Once | INTRAMUSCULAR | Status: AC
  Administered 2016-04-17: 4 mg via INTRAVENOUS
  Filled 2016-04-17: qty 2

## 2016-04-17 MED ORDER — CEFAZOLIN SODIUM-DEXTROSE 2-4 GM/100ML-% IV SOLN
INTRAVENOUS | Status: AC
  Filled 2016-04-17: qty 100

## 2016-04-17 MED ORDER — LIDOCAINE 2% (20 MG/ML) 5 ML SYRINGE
INTRAMUSCULAR | Status: AC
  Filled 2016-04-17: qty 5

## 2016-04-17 MED ORDER — METHOCARBAMOL 500 MG PO TABS: 500.0000 mg | ORAL_TABLET | Freq: Four times a day (QID) | ORAL | Status: DC | PRN

## 2016-04-17 MED ORDER — MENTHOL 3 MG MT LOZG
1.0000 | LOZENGE | OROMUCOSAL | Status: DC | PRN
  Filled 2016-04-17: qty 9

## 2016-04-17 MED ORDER — SUGAMMADEX SODIUM 200 MG/2ML IV SOLN
INTRAVENOUS | Status: DC | PRN
Start: 2016-04-17 — End: 2016-04-17
  Administered 2016-04-17: 200 mg via INTRAVENOUS

## 2016-04-17 MED ORDER — ALUM & MAG HYDROXIDE-SIMETH 200-200-20 MG/5ML PO SUSP: 30.0000 mL | ORAL | Status: DC | PRN

## 2016-04-17 MED ORDER — FENTANYL CITRATE (PF) 100 MCG/2ML IJ SOLN: 25.0000 ug | INTRAMUSCULAR | Status: DC | PRN

## 2016-04-17 MED ORDER — PHENOL 1.4 % MT LIQD
1.0000 | OROMUCOSAL | Status: DC | PRN
  Filled 2016-04-17: qty 177

## 2016-04-17 SURGICAL SUPPLY — 53 items
ADH SKN CLS APL DERMABOND .7 (GAUZE/BANDAGES/DRESSINGS) ×1
BAG SPEC THK2 15X12 ZIP CLS (MISCELLANEOUS) ×1
BAG ZIPLOCK 12X15 (MISCELLANEOUS) ×2 IMPLANT
BLADE SAW SGTL 18X1.27X75 (BLADE) ×2 IMPLANT
CAPT HIP HEMI 2 ×1 IMPLANT
DERMABOND ADVANCED (GAUZE/BANDAGES/DRESSINGS) ×1
DERMABOND ADVANCED .7 DNX12 (GAUZE/BANDAGES/DRESSINGS) ×1 IMPLANT
DRAPE INCISE IOBAN 85X60 (DRAPES) ×2 IMPLANT
DRAPE ORTHO SPLIT 77X108 STRL (DRAPES) ×4
DRAPE POUCH INSTRU U-SHP 10X18 (DRAPES) ×2 IMPLANT
DRAPE SURG 17X11 SM STRL (DRAPES) ×2 IMPLANT
DRAPE SURG ORHT 6 SPLT 77X108 (DRAPES) ×2 IMPLANT
DRAPE U-SHAPE 47X51 STRL (DRAPES) ×2 IMPLANT
DRESSING AQUACEL AG SP 3.5X10 (GAUZE/BANDAGES/DRESSINGS) IMPLANT
DRSG AQUACEL AG ADV 3.5X10 (GAUZE/BANDAGES/DRESSINGS) ×2 IMPLANT
DRSG AQUACEL AG SP 3.5X10 (GAUZE/BANDAGES/DRESSINGS) ×2
DRSG TEGADERM 4X4.75 (GAUZE/BANDAGES/DRESSINGS) ×2 IMPLANT
DURAPREP 26ML APPLICATOR (WOUND CARE) ×2 IMPLANT
ELECT BLADE TIP CTD 4 INCH (ELECTRODE) ×2 IMPLANT
ELECT REM PT RETURN 9FT ADLT (ELECTROSURGICAL) ×2
ELECTRODE REM PT RTRN 9FT ADLT (ELECTROSURGICAL) ×1 IMPLANT
EVACUATOR 1/8 PVC DRAIN (DRAIN) ×2 IMPLANT
FACESHIELD WRAPAROUND (MASK) ×8 IMPLANT
FACESHIELD WRAPAROUND OR TEAM (MASK) ×4 IMPLANT
GAUZE SPONGE 2X2 8PLY STRL LF (GAUZE/BANDAGES/DRESSINGS) ×1 IMPLANT
GLOVE BIOGEL M 7.0 STRL (GLOVE) IMPLANT
GLOVE BIOGEL PI IND STRL 7.5 (GLOVE) ×1 IMPLANT
GLOVE BIOGEL PI IND STRL 8.5 (GLOVE) ×1 IMPLANT
GLOVE BIOGEL PI INDICATOR 7.5 (GLOVE) ×1
GLOVE BIOGEL PI INDICATOR 8.5 (GLOVE) ×1
GLOVE ECLIPSE 8.0 STRL XLNG CF (GLOVE) IMPLANT
GLOVE ORTHO TXT STRL SZ7.5 (GLOVE) ×4 IMPLANT
GLOVE SURG ORTHO 8.0 STRL STRW (GLOVE) ×2 IMPLANT
GOWN STRL REUS W/TWL LRG LVL3 (GOWN DISPOSABLE) ×2 IMPLANT
GOWN STRL REUS W/TWL XL LVL3 (GOWN DISPOSABLE) ×4 IMPLANT
HANDPIECE INTERPULSE COAX TIP (DISPOSABLE)
IMMOBILIZER KNEE 20 (SOFTGOODS)
IMMOBILIZER KNEE 20 THIGH 36 (SOFTGOODS) IMPLANT
KIT BASIN OR (CUSTOM PROCEDURE TRAY) ×2 IMPLANT
MANIFOLD NEPTUNE II (INSTRUMENTS) ×2 IMPLANT
PACK TOTAL JOINT (CUSTOM PROCEDURE TRAY) ×2 IMPLANT
POSITIONER SURGICAL ARM (MISCELLANEOUS) ×2 IMPLANT
SET HNDPC FAN SPRY TIP SCT (DISPOSABLE) IMPLANT
SPONGE GAUZE 2X2 STER 10/PKG (GAUZE/BANDAGES/DRESSINGS) ×1
STRIP CLOSURE SKIN 1/2X4 (GAUZE/BANDAGES/DRESSINGS) ×4 IMPLANT
SUT ETHIBOND NAB CT1 #1 30IN (SUTURE) ×2 IMPLANT
SUT MNCRL AB 4-0 PS2 18 (SUTURE) ×2 IMPLANT
SUT VIC AB 1 CT1 36 (SUTURE) ×4 IMPLANT
SUT VIC AB 2-0 CT1 27 (SUTURE) ×4
SUT VIC AB 2-0 CT1 TAPERPNT 27 (SUTURE) ×2 IMPLANT
SUT VLOC 180 0 24IN GS25 (SUTURE) ×2 IMPLANT
TOWEL OR 17X26 10 PK STRL BLUE (TOWEL DISPOSABLE) ×4 IMPLANT
TRAY FOLEY W/METER SILVER 16FR (SET/KITS/TRAYS/PACK) ×2 IMPLANT

## 2016-04-17 NOTE — Progress Notes (Signed)
Patient very disoriented post-op.  Trying to get up out of bed, pulling at foley catheter and IVs.  MD made aware.  Order for IV ativan placed.

## 2016-04-17 NOTE — Progress Notes (Signed)
Per daughter, at bedside, patient wore CPAP for just a little while last night before she ripped it off. Today, it placed on again for post op. She is awake and eating at this time, but declines the use for tonight. Equipment remains at bedside. RT will continue to follow.

## 2016-04-17 NOTE — H&P (View-Only) (Signed)
   Subjective: Hospital day - 2 Patient reports pain to daughter as moderate.   [place] speaking female known to our practice who fell and sustained a right hip fracture. Daughter in room at bedside and is able to translate during morning rounds.   Patient seen in rounds for Dr. Alvan Dame. Patient is having problems with pain in the hip, requiring pain medications.  Daughter states patient gets confused easily with narcotics and wants to talk to the anesthesia department to help determine what type would be best for her mother.  She also states that she does have problems with her blood pressure dropping with medications in the past.  Objective: Vital signs in last 24 hours: Temp:  [98.2 F (36.8 C)-99 F (37.2 C)] 99 F (37.2 C) (11/25 0514) Pulse Rate:  [76-83] 78 (11/25 0514) Resp:  [16-20] 16 (11/25 0514) BP: (117-146)/(62-95) 135/70 (11/25 0514) SpO2:  [90 %-93 %] 91 % (11/25 0514)  Intake/Output from previous day:  Intake/Output Summary (Last 24 hours) at 04/17/16 0755 Last data filed at 04/16/16 2230  Gross per 24 hour  Intake           1697.5 ml  Output                0 ml  Net           1697.5 ml    Intake/Output this shift: No intake/output data recorded.  Labs:  Recent Labs  04/15/16 1555  HGB 14.2    Recent Labs  04/15/16 1555  WBC 6.6  RBC 4.86  HCT 44.6  PLT 151    Recent Labs  04/15/16 1555 04/16/16 0338  NA 139 137  K 4.0 4.3  CL 106 104  CO2 28 28  BUN 15 16  CREATININE 0.95 0.93  GLUCOSE 108* 127*  CALCIUM 8.4* 8.5*    Recent Labs  04/16/16 0338  INR 1.07    EXAM General - Patient is Alert Extremity - Neurovascular intact Sensation intact distally Motor Function - intact, moving foot and toes well on exam.   Past Medical History:  Diagnosis Date  . Allergy    rhinitis  . Asthma   . Chronic kidney disease    hx of cystitis   . COPD (chronic obstructive pulmonary disease) ([place])   . Depression   . Dizziness    vertigo     . Gallstones   . GERD (gastroesophageal reflux disease)   . Hyperlipidemia   . Hypertension   . Obesity   . Osteoarthritis   . Osteoporosis   . Peripheral vascular disease (HCC)    swelling   . Pneumonia   . Shortness of breath    with exertion   . Shoulder injury    left  . Sleep apnea    never diagnosed   . Urinary incontinence     Assessment/Plan: Hospital day - 2 Principal Problem:   Closed right hip fracture (HCC) Active Problems:   Asthma, moderate persistent, well-controlled   OSA on CPAP   Morbid obesity (HCC)   Orthostatic hypotension   Hip fracture (HCC)   Fall  Estimated body mass index is 36.39 kg/m as calculated from the following:   Height as of 04/02/16: 5\' 4"  (1.626 m).   Weight as of 04/02/16: 96.2 kg (212 lb).  For surgery later this morning. NPO at this time. Surgery as per Dr. Alvan Dame.  Arlee Muslim, PA-C Orthopaedic Surgery 04/17/2016, 7:55 AM

## 2016-04-17 NOTE — Anesthesia Preprocedure Evaluation (Addendum)
Anesthesia Evaluation  Patient identified by MRN, date of birth, ID band Patient awake    Reviewed: Allergy & Precautions, NPO status , Patient's Chart, lab work & pertinent test results  Airway Mallampati: II  TM Distance: >3 FB Neck ROM: Full    Dental  (+) Edentulous Upper, Edentulous Lower   Pulmonary sleep apnea , COPD, former smoker,    breath sounds clear to auscultation       Cardiovascular hypertension, negative cardio ROS   Rhythm:Regular Rate:Normal     Neuro/Psych Anxiety Depression Severe spinal stenosis    GI/Hepatic Neg liver ROS, GERD  ,  Endo/Other  negative endocrine ROS  Renal/GU Renal disease     Musculoskeletal  (+) Arthritis ,   Abdominal   Peds  Hematology negative hematology ROS (+)   Anesthesia Other Findings   Reproductive/Obstetrics                            Lab Results  Component Value Date   WBC 6.6 04/15/2016   HGB 14.2 04/15/2016   HCT 44.6 04/15/2016   MCV 91.8 04/15/2016   PLT 151 04/15/2016   Lab Results  Component Value Date   CREATININE 0.93 04/16/2016   BUN 16 04/16/2016   NA 137 04/16/2016   K 4.3 04/16/2016   CL 104 04/16/2016   CO2 28 04/16/2016   Lab Results  Component Value Date   INR 1.07 04/16/2016   INR 1.07 09/06/2011    Anesthesia Physical Anesthesia Plan  ASA: III  Anesthesia Plan: General   Post-op Pain Management:    Induction: Intravenous  Airway Management Planned: Oral ETT  Additional Equipment:   Intra-op Plan:   Post-operative Plan: Extubation in OR  Informed Consent: I have reviewed the patients History and Physical, chart, labs and discussed the procedure including the risks, benefits and alternatives for the proposed anesthesia with the patient or authorized representative who has indicated his/her understanding and acceptance.   Dental advisory given  Plan Discussed with: CRNA  Anesthesia Plan  Comments:         Anesthesia Quick Evaluation

## 2016-04-17 NOTE — Op Note (Signed)
NAMEMarkeya, Boag                ACCOUNT NO.:  [id number]   MEDICAL RECORD NO.: [id number]   LOCATION:  [place]                         FACILITY:  [facility]   DATE OF BIRTH:  05/17/1934  PHYSICIAN:  Pietro Cassis. Alvan Dame, M.D.     DATE OF PROCEDURE:  04/17/16                               OPERATIVE REPORT     PREOPERATIVE DIAGNOSIS:  Right displaced femoral neck fracture.   POSTOPERATIVE DIAGNOSIS:  Right displaced femoral neck fracture.   PROCEDURE:  Right hip hemiarthroplasty utilizing DePuy component, size 7 standard Tri-Lock stem with a 67mm unipolar ball with a +0 adapter.   SURGEON:  Pietro Cassis. Alvan Dame, MD   ASSISTANT:  None   ANESTHESIA:  General.   SPECIMENS:  None.   DRAINS:  None.   BLOOD LOSS:  About 400 cc.   COMPLICATIONS:  None.   INDICATION OF PROCEDURE:  Ms Nakanishi is a pleasant 82 year old female who lives Independently with her husband.  She unfortunately had a fall at her house.  She was admitted to the hospital after radiographs revealed a femoral neck fracture.  She was seen and evaluated and was scheduled for surgery for fixation.  The necessity of surgical repair was discussed with she and her family.  Consent was obtained after reviewing risks of infection, DVT, component failure, and need for revision surgery.   PROCEDURE IN DETAIL:  The patient was brought to the operative theater. Once adequate anesthesia, preoperative antibiotics, 2 g of Ancef, Tylenol, and 10 mg Decadron administered, the patient was positioned into the left lateral decubitus position with the right side up.  The left lower extremity was then prepped and draped in sterile fashion.  A time-out was performed identifying the patient, planned procedure, and extremity.   A lateral incision was made off the proximal trochanter. Sharp dissection was carried down to the iliotibial band and gluteal fascia. The gluteal fascia was then incised for posterior approach.  The  short external rotators were taken down separate from the posterior capsule. An L capsulotomy was made preserving the posterior leaflet for later anatomic repair. Fracture site was identified and after removing comminuted segments of the posterior femoral neck, the femoral head was removed without difficulty and measured on the back table  using the sizing rings and determined to be 50 mm in diameter.   The proximal femur was then exposed.  Retractors placed.  I then drilled, opened the proximal femur.  Then I hand reamed once and  Irrigated the canal to try to prevent fat emboli.  I began broaching the femur with a starter broach up to a size 7 broach with good medial and lateral metaphyseal fit without evidence of any torsion or movement.  A trial reduction was carried out with a standard offset neck and a +0 adapter with a 50 mm ball.  The hip reduced nicely.  The leg lengths appeared to be equal compared to the down leg.   The hip went through a range of motion without evidence of any subluxation or impingement.   Given these findings, the trial components removed.  The final 7 standard  Tri-Lock stem was opened.  After irrigating  the canal, the final stem was impacted and sat at the level where the broach was. Based on this and the trial reduction, a +0 adapter was opened and impacted in the 60mm unipolar ball onto a clean and dry trunnion.  The hip had been irrigated throughout the case and again at this point.  I re- Approximated the posterior capsule to the superior leaflet using a  #1 Vicryl.  The remainder of the wound was closed with #1 Vicryl and #0 V-lock sutures in the iliotibial band and gluteal fascia, a  2-0 Vicryl in the sub-Q tissue and a running 4-0 Monocryl in the skin.  The hip was cleaned, dried, and dressed sterilely using Dermabond and Aquacel dressing.  She was then brought to recovery room, extubated in stable condition, tolerating the procedure well.          Pietro Cassis Alvan Dame, M.D.

## 2016-04-17 NOTE — Transfer of Care (Signed)
Immediate Anesthesia Transfer of Care Note  Patient: Sabrina Page  Procedure(s) Performed: Procedure(s): ARTHROPLASTY BIPOLAR HIP (HEMIARTHROPLASTY) (Right)  Patient Location: PACU  Anesthesia Type:General  Level of Consciousness: sedated  Airway & Oxygen Therapy: Patient Spontanous Breathing and Patient connected to face mask oxygen  Post-op Assessment: Report given to RN and Post -op Vital signs reviewed and stable  Post vital signs: Reviewed and stable  Last Vitals:  Vitals:   04/16/16 2251 04/17/16 0514  BP:  135/70  Pulse: 79 78  Resp: 18 16  Temp:  37.2 C    Last Pain:  Vitals:   04/17/16 0613  TempSrc:   PainSc: 1       Patients Stated Pain Goal: 2 ([id number])  Complications: No apparent anesthesia complications

## 2016-04-17 NOTE — Progress Notes (Signed)
   Subjective: Hospital day - 2 Patient reports pain to daughter as moderate.   [place] speaking female known to our practice who fell and sustained a right hip fracture. Daughter in room at bedside and is able to translate during morning rounds.   Patient seen in rounds for Dr. Alvan Dame. Patient is having problems with pain in the hip, requiring pain medications.  Daughter states patient gets confused easily with narcotics and wants to talk to the anesthesia department to help determine what type would be best for her mother.  She also states that she does have problems with her blood pressure dropping with medications in the past.  Objective: Vital signs in last 24 hours: Temp:  [98.2 F (36.8 C)-99 F (37.2 C)] 99 F (37.2 C) (11/25 0514) Pulse Rate:  [76-83] 78 (11/25 0514) Resp:  [16-20] 16 (11/25 0514) BP: (117-146)/(62-95) 135/70 (11/25 0514) SpO2:  [90 %-93 %] 91 % (11/25 0514)  Intake/Output from previous day:  Intake/Output Summary (Last 24 hours) at 04/17/16 0755 Last data filed at 04/16/16 2230  Gross per 24 hour  Intake           1697.5 ml  Output                0 ml  Net           1697.5 ml    Intake/Output this shift: No intake/output data recorded.  Labs:  Recent Labs  04/15/16 1555  HGB 14.2    Recent Labs  04/15/16 1555  WBC 6.6  RBC 4.86  HCT 44.6  PLT 151    Recent Labs  04/15/16 1555 04/16/16 0338  NA 139 137  K 4.0 4.3  CL 106 104  CO2 28 28  BUN 15 16  CREATININE 0.95 0.93  GLUCOSE 108* 127*  CALCIUM 8.4* 8.5*    Recent Labs  04/16/16 0338  INR 1.07    EXAM General - Patient is Alert Extremity - Neurovascular intact Sensation intact distally Motor Function - intact, moving foot and toes well on exam.   Past Medical History:  Diagnosis Date  . Allergy    rhinitis  . Asthma   . Chronic kidney disease    hx of cystitis   . COPD (chronic obstructive pulmonary disease) ([place])   . Depression   . Dizziness    vertigo     . Gallstones   . GERD (gastroesophageal reflux disease)   . Hyperlipidemia   . Hypertension   . Obesity   . Osteoarthritis   . Osteoporosis   . Peripheral vascular disease (HCC)    swelling   . Pneumonia   . Shortness of breath    with exertion   . Shoulder injury    left  . Sleep apnea    never diagnosed   . Urinary incontinence     Assessment/Plan: Hospital day - 2 Principal Problem:   Closed right hip fracture (HCC) Active Problems:   Asthma, moderate persistent, well-controlled   OSA on CPAP   Morbid obesity (HCC)   Orthostatic hypotension   Hip fracture (HCC)   Fall  Estimated body mass index is 36.39 kg/m as calculated from the following:   Height as of 04/02/16: 5\' 4"  (1.626 m).   Weight as of 04/02/16: 96.2 kg (212 lb).  For surgery later this morning. NPO at this time. Surgery as per Dr. Alvan Dame.  Arlee Muslim, PA-C Orthopaedic Surgery 04/17/2016, 7:55 AM

## 2016-04-17 NOTE — Interval H&P Note (Signed)
History and Physical Interval Note:  04/17/2016 10:53 AM  Sabrina Page  has presented today for surgery, with the diagnosis of right femoral neck fracture  The various methods of treatment have been discussed with the patient and family. After consideration of risks, benefits and other options for treatment, the patient has consented to  Procedure(s): ARTHROPLASTY BIPOLAR HIP (HEMIARTHROPLASTY) (Right) as a surgical intervention .  The patient's history has been reviewed, patient examined, no change in status, stable for surgery.  I have reviewed the patient's chart and labs.  Questions were answered to the patient's satisfaction.     Mauri Pole

## 2016-04-17 NOTE — Anesthesia Procedure Notes (Signed)
Procedure Name: Intubation Date/Time: 04/17/2016 11:05 AM Performed by: Danley Danker L Patient Re-evaluated:Patient Re-evaluated prior to inductionOxygen Delivery Method: Circle system utilized Preoxygenation: Pre-oxygenation with 100% oxygen Intubation Type: IV induction Ventilation: Mask ventilation without difficulty and Oral airway inserted - appropriate to patient size Laryngoscope Size: Mac and 3 Grade View: Grade II Tube type: Oral Tube size: 7.5 mm Number of attempts: 1 Airway Equipment and Method: Stylet Placement Confirmation: ETT inserted through vocal cords under direct vision,  positive ETCO2 and breath sounds checked- equal and bilateral Secured at: 21 cm Tube secured with: Tape Dental Injury: Teeth and Oropharynx as per pre-operative assessment  Comments: Intubation by Dr. Deatra Canter

## 2016-04-17 NOTE — Progress Notes (Signed)
PROGRESS NOTE    Sabrina Page  [name] DOB: 1933-10-09 DOA: 04/15/2016 PCP: [name], MD    Brief Narrative:  82 y.o. woman with a known history of orthostatic hypotension, HTN, HLD, COPD, asthma, and GERD who had an accidental fall this morning in her apartment.  She speaks [place], and her daughter is at bedside to interpret (formal interpreter services were declined).  The patient denies any preceding aura, headache, chest pain.  She recalls slipping (she was attempting to ambulate in compression hose) while trying to sit down at the table.  She denies loss of consciousness.  She admits that she will get dizzy when she is orthostatic.  Reportedly, her BP at home this morning went from 120/80 when sitting to 70/50 when standing  Assessment & Plan:   Principal Problem:   Closed right hip fracture (HCC) Active Problems:   Asthma, moderate persistent, well-controlled   OSA on CPAP   Morbid obesity (HCC)   Orthostatic hypotension   Hip fracture ([place])   Fall   Right hip fracture, accidental fall reported but also known orthostasis -Continue analgesics as needed -Orthopedic surgery consulted. -Patient underwent surgery on 11/25  Shortness of breath Remains stable when evaluated She is continued on when necessary DuoNeb's  OSA Patient to continue daily at bedtime CPAP -Stable at this time  Orthostatic hypotension Patient is continued on normal saline 75 mL per hour --Continue midodrine 10mg  TID -Stable this Afternoon  Acute delirium -postoperatively, patient noted to be manic, confused -likely secondary to recent sedation from surgery -will give trial of ativan x1 dose IV -EKG reviewed. QTc in the 470's. If patient becomes danger to herself, then can consider haldol at that time   DVT prophylaxis: SCDs Code Status: Full code Family Communication: Patient's daughter at bedside Disposition Plan: Uncertain at this time, likely will need skilled  nursing facility at times discharge  Consultants:   Orthopedic surgery  Procedures:   R hemiarthoplasty 11/25  Antimicrobials: Anti-infectives    Start     Dose/Rate Route Frequency Ordered Stop   04/17/16 1800  ceFAZolin (ANCEF) IVPB 1 g/50 mL premix     1 g 100 mL/hr over 30 Minutes Intravenous Every 6 hours 04/17/16 1504 04/18/16 0559   04/16/16 1045  ceFAZolin (ANCEF) IVPB 2g/100 mL premix  Status:  Discontinued     2 g 200 mL/hr over 30 Minutes Intravenous On call to O.R. 04/16/16 1031 04/17/16 1437      Subjective: Pleasantly confused  Objective: Vitals:   04/17/16 1429 04/17/16 1551 04/17/16 1610 04/17/16 1627  BP: 135/76 (!) 159/79 (!) 151/72 (!) 153/91  Pulse:  78 92 82  Resp: 16 18  18   Temp: 98.2 F (36.8 C) 98.4 F (36.9 C) 98.4 F (36.9 C) 98.1 F (36.7 C)  TempSrc:  Oral Oral Axillary  SpO2: 99% 100% 99% 95%    Intake/Output Summary (Last 24 hours) at 04/17/16 1636 Last data filed at 04/17/16 1407  Gross per 24 hour  Intake           2957.5 ml  Output              575 ml  Net           2382.5 ml   There were no vitals filed for this visit.  Examination:  General exam: Laying in bed, in nad Respiratory system: normal chest rise, no crackles Cardiovascular system: regular rhythm, s1, s2 Gastrointestinal system: obese, nondistended, pos BS Central nervous system: cn2-12  grossly intact, strength intact throughout Extremities: perfused, no clubbing Skin: normal skin turgor, no notable skin lesions seen Psychiatry:confused, manic, mood appears unremarkable   Data Reviewed: I have personally reviewed following labs and imaging studies  CBC:  Recent Labs Lab 04/15/16 1555  WBC 6.6  NEUTROABS 4.6  HGB 14.2  HCT 44.6  MCV 91.8  PLT 123XX123   Basic Metabolic Panel:  Recent Labs Lab 04/15/16 1555 04/16/16 0338  NA 139 137  K 4.0 4.3  CL 106 104  CO2 28 28  GLUCOSE 108* 127*  BUN 15 16  CREATININE 0.95 0.93  CALCIUM 8.4* 8.5*    GFR: CrCl cannot be calculated (Unknown ideal weight.). Liver Function Tests:  Recent Labs Lab 04/16/16 0338  AST 26  ALT 22  ALKPHOS 48  BILITOT 0.9  PROT 6.3*  ALBUMIN 3.7   No results for input(s): LIPASE, AMYLASE in the last 168 hours. No results for input(s): AMMONIA in the last 168 hours. Coagulation Profile:  Recent Labs Lab 04/16/16 0338  INR 1.07   Cardiac Enzymes: No results for input(s): CKTOTAL, CKMB, CKMBINDEX, TROPONINI in the last 168 hours. BNP (last 3 results) No results for input(s): PROBNP in the last 8760 hours. HbA1C: No results for input(s): HGBA1C in the last 72 hours. CBG: No results for input(s): GLUCAP in the last 168 hours. Lipid Profile: No results for input(s): CHOL, HDL, LDLCALC, TRIG, CHOLHDL, LDLDIRECT in the last 72 hours. Thyroid Function Tests: No results for input(s): TSH, T4TOTAL, FREET4, T3FREE, THYROIDAB in the last 72 hours. Anemia Panel: No results for input(s): VITAMINB12, FOLATE, FERRITIN, TIBC, IRON, RETICCTPCT in the last 72 hours. Sepsis Labs: No results for input(s): PROCALCITON, LATICACIDVEN in the last 168 hours.  Recent Results (from the past 240 hour(s))  Surgical pcr screen     Status: None   Collection Time: 04/17/16  5:49 AM  Result Value Ref Range Status   MRSA, PCR NEGATIVE NEGATIVE Final   Staphylococcus aureus NEGATIVE NEGATIVE Final    Comment:        The Xpert SA Assay (FDA approved for NASAL specimens in patients over 67 years of age), is one component of a comprehensive surveillance program.  Test performance has been validated by [facility] for patients greater than or equal to 23 year old. It is not intended to diagnose infection nor to guide or monitor treatment.      Radiology Studies: Dg Chest 1 View  Result Date: 04/15/2016 CLINICAL DATA:  Pain after trauma EXAM: CHEST 1 VIEW COMPARISON:  January 18, 2016 FINDINGS: The heart size is more prominent the interval, probably due to the  portable technique. The mediastinum is also more prominent. Increased interstitial prominence without overt edema. No focal infiltrate. No other acute abnormalities. IMPRESSION: The heart appears more prominent as does the mediastinum. This very well may be due to the low volume portable technique. Recommend a PA and lateral chest x-ray before discharge. Increased interstitial prominence could represent pulmonary venous congestion versus technical differences as well. No overt edema. Electronically Signed   By: Dorise Bullion III M.D   On: 04/15/2016 17:16   Dg Pelvis 1-2 Views  Result Date: 04/15/2016 CLINICAL DATA:  Patient fell out of wheelchair. EXAM: PELVIS - 1-2 VIEW COMPARISON:  None. FINDINGS: There is a right hip fracture. IMPRESSION: There is a right hip fracture incompletely evaluated. Electronically Signed   By: Dorise Bullion III M.D   On: 04/15/2016 17:14   Pelvis Portable  Result Date:  04/17/2016 CLINICAL DATA:  Status post right hip replacement EXAM: PORTABLE PELVIS 1-2 VIEWS COMPARISON:  04/15/2016 FINDINGS: Right hip prosthesis is now seen in satisfactory position. No acute bony or soft tissue abnormality is noted. IMPRESSION: Status post right hip replacement. Electronically Signed   By: Inez Catalina M.D.   On: 04/17/2016 13:22   Dg Femur Min 2 Views Right  Result Date: 04/15/2016 CLINICAL DATA:  Pain after trauma EXAM: RIGHT FEMUR 2 VIEWS COMPARISON:  None FINDINGS: There is a subcapital right hip fracture with no dislocation. No other abnormalities. IMPRESSION: There is a subcapital right hip fracture with no dislocation. Electronically Signed   By: Dorise Bullion III M.D   On: 04/15/2016 17:15    Scheduled Meds: . aspirin EC  81 mg Oral BID  .  ceFAZolin (ANCEF) IV  1 g Intravenous Q6H  . docusate sodium  100 mg Oral BID  . feeding supplement (ENSURE ENLIVE)  237 mL Oral Q24H  . ferrous sulfate  325 mg Oral TID PC  . FLUoxetine  40 mg Oral Daily  . loratadine  10 mg  Oral q morning - 10a  . midodrine  10 mg Oral TID WC  . pantoprazole  40 mg Oral Daily  . potassium chloride  10 mEq Oral Daily  . traMADol  50 mg Oral BID   Continuous Infusions: . sodium chloride 75 mL/hr at 04/17/16 1631     LOS: 2 days   CHIU, Orpah Melter, MD [facility] Pager [phone]  If 7PM-7AM, please contact night-coverage [email or web address] Password [facility] 04/17/2016, 4:36 PM

## 2016-04-17 NOTE — Anesthesia Postprocedure Evaluation (Signed)
Anesthesia Post Note  Patient: Sabrina Page  Procedure(s) Performed: Procedure(s) (LRB): ARTHROPLASTY BIPOLAR HIP (HEMIARTHROPLASTY) (Right)  Patient location during evaluation: PACU Anesthesia Type: General Level of consciousness: awake and alert Pain management: pain level controlled Vital Signs Assessment: post-procedure vital signs reviewed and stable Respiratory status: spontaneous breathing, nonlabored ventilation, respiratory function stable and patient connected to nasal cannula oxygen Cardiovascular status: blood pressure returned to baseline and stable Postop Assessment: no signs of nausea or vomiting Anesthetic complications: no    Last Vitals:  Vitals:   04/17/16 1315 04/17/16 1330  BP: (!) 141/66 109/76  Pulse: 83 82  Resp: (!) 22 (!) 22  Temp:      Last Pain:  Vitals:   04/17/16 0613  TempSrc:   PainSc: 1                  Tiajuana Amass

## 2016-04-18 ENCOUNTER — Inpatient Hospital Stay (HOSPITAL_COMMUNITY): Payer: Medicare Other

## 2016-04-18 LAB — CBC
HCT: 34.8 % — ABNORMAL LOW (ref 36.0–46.0)
Hemoglobin: 11.3 g/dL — ABNORMAL LOW (ref 12.0–15.0)
MCH: 29.4 pg (ref 26.0–34.0)
MCHC: 32.5 g/dL (ref 30.0–36.0)
MCV: 90.6 fL (ref 78.0–100.0)
Platelets: 149 10*3/uL — ABNORMAL LOW (ref 150–400)
RBC: 3.84 MIL/uL — ABNORMAL LOW (ref 3.87–5.11)
RDW: 12.6 % (ref 11.5–15.5)
WBC: 7.9 10*3/uL (ref 4.0–10.5)

## 2016-04-18 LAB — BASIC METABOLIC PANEL
Anion gap: 6 (ref 5–15)
BUN: 14 mg/dL (ref 6–20)
CO2: 30 mmol/L (ref 22–32)
Calcium: 8.6 mg/dL — ABNORMAL LOW (ref 8.9–10.3)
Chloride: 101 mmol/L (ref 101–111)
Creatinine, Ser: 0.79 mg/dL (ref 0.44–1.00)
GFR calc Af Amer: >60 mL/min (ref >60)
GFR calc non Af Amer: >60 mL/min (ref >60)
Glucose, Bld: 127 mg/dL — ABNORMAL HIGH (ref 65–99)
Potassium: 4.4 mmol/L (ref 3.5–5.1)
Sodium: 137 mmol/L (ref 135–145)

## 2016-04-18 MED ORDER — OMEPRAZOLE 20 MG PO CPDR
20.0000 mg | DELAYED_RELEASE_CAPSULE | Freq: Every day | ORAL | Status: DC
Start: 2016-04-18 — End: 2016-04-20
  Administered 2016-04-18 – 2016-04-20 (×3): 20 mg via ORAL
  Filled 2016-04-18 (×3): qty 1

## 2016-04-18 NOTE — Progress Notes (Signed)
PROGRESS NOTE    Sabrina Page  [id number] DOB: May 09, 1934 DOA: 04/15/2016 PCP: [name], MD    Brief Narrative:  82 y.o. woman with a known history of orthostatic hypotension, HTN, HLD, COPD, asthma, and GERD who had an accidental fall this morning in her apartment.  She speaks [place], and her daughter is at bedside to interpret (formal interpreter services were declined).  The patient denies any preceding aura, headache, chest pain.  She recalls slipping (she was attempting to ambulate in compression hose) while trying to sit down at the table.  She denies loss of consciousness.  She admits that she will get dizzy when she is orthostatic.  Reportedly, her BP at home this morning went from 120/80 when sitting to 70/50 when standing  Assessment & Plan:   Principal Problem:   Closed right hip fracture (HCC) Active Problems:   Asthma, moderate persistent, well-controlled   OSA on CPAP   Morbid obesity (HCC)   Orthostatic hypotension   Hip fracture ([place])   Fall   Right hip fracture, accidental fall reported but also known orthostasis -Continue analgesics as needed -Orthopedic surgery is following and patient underwent surgery on 11/25 -Stable at present  Shortness of breath Remains stable when evaluated She is continued on when necessary DuoNeb's  OSA Patient to continue daily at bedtime CPAP -Stable at this time  Orthostatic hypotension Continue midodrine 10mg  TID Have obtained repeat orthostatics this afternoon. Patient is orthostatic from sitting to standing  Acute delirium -postoperatively, patient noted to be manic, confused -likely secondary to recent sedation from surgery -will give trial of ativan x1 dose IV with much improvement in mentation by the following morning -Now stable and at baseline  Prominent heart and mediastinum on chest x-ray -Noted on presenting chest x-ray findings. -Radiology recommendations for PA and lateral chest x-ray  prior to discharge to rule out pulmonary venous congestion versus technical differences. -Will order x-ray  DVT prophylaxis: SCDs Code Status: Full code Family Communication: Patient's daughter at bedside Disposition Plan: Uncertain at this time, likely will need skilled nursing facility at times discharge  Consultants:   Orthopedic surgery  Procedures:   R hemiarthoplasty 11/25  Antimicrobials: Anti-infectives    Start     Dose/Rate Route Frequency Ordered Stop   04/17/16 1800  ceFAZolin (ANCEF) IVPB 1 g/50 mL premix     1 g 100 mL/hr over 30 Minutes Intravenous Every 6 hours 04/17/16 1504 04/18/16 0039   04/16/16 1045  ceFAZolin (ANCEF) IVPB 2g/100 mL premix  Status:  Discontinued     2 g 200 mL/hr over 30 Minutes Intravenous On call to O.R. 04/16/16 1031 04/17/16 1437      Subjective: Without complaints today  Objective: Vitals:   04/18/16 0513 04/18/16 1050 04/18/16 1051 04/18/16 1321  BP: (!) 158/84   (!) 160/75  Pulse: 79   76  Resp: 18   18  Temp: 98.3 F (36.8 C)   98.2 F (36.8 C)  TempSrc: Oral   Oral  SpO2: 99% (!) 88% 99% 94%    Intake/Output Summary (Last 24 hours) at 04/18/16 1721 Last data filed at 04/18/16 1323  Gross per 24 hour  Intake          1301.25 ml  Output              775 ml  Net           526.25 ml   There were no vitals filed for this visit.  Examination:  General exam: Awake, conversant, no acute distress Respiratory system: Normal respiratory effort, no wheezing on auscultation Cardiovascular system: Regular rate, S1-S2 and auscultation Gastrointestinal system: Positive bowel sounds, nontender Central nervous system: No seizures, no tremors Extremities: Cyanosis, no joint deformities Skin: No rashes, no pallor Psychiatry: Mood normal, no auditory hallucinations  Data Reviewed: I have personally reviewed following labs and imaging studies  CBC:  Recent Labs Lab 04/15/16 1555 04/18/16 0516  WBC 6.6 7.9  NEUTROABS 4.6   --   HGB 14.2 11.3*  HCT 44.6 34.8*  MCV 91.8 90.6  PLT 151 123456*   Basic Metabolic Panel:  Recent Labs Lab 04/15/16 1555 04/16/16 0338 04/18/16 0516  NA 139 137 137  K 4.0 4.3 4.4  CL 106 104 101  CO2 28 28 30   GLUCOSE 108* 127* 127*  BUN 15 16 14   CREATININE 0.95 0.93 0.79  CALCIUM 8.4* 8.5* 8.6*   GFR: CrCl cannot be calculated (Unknown ideal weight.). Liver Function Tests:  Recent Labs Lab 04/16/16 0338  AST 26  ALT 22  ALKPHOS 48  BILITOT 0.9  PROT 6.3*  ALBUMIN 3.7   No results for input(s): LIPASE, AMYLASE in the last 168 hours. No results for input(s): AMMONIA in the last 168 hours. Coagulation Profile:  Recent Labs Lab 04/16/16 0338  INR 1.07   Cardiac Enzymes: No results for input(s): CKTOTAL, CKMB, CKMBINDEX, TROPONINI in the last 168 hours. BNP (last 3 results) No results for input(s): PROBNP in the last 8760 hours. HbA1C: No results for input(s): HGBA1C in the last 72 hours. CBG: No results for input(s): GLUCAP in the last 168 hours. Lipid Profile: No results for input(s): CHOL, HDL, LDLCALC, TRIG, CHOLHDL, LDLDIRECT in the last 72 hours. Thyroid Function Tests: No results for input(s): TSH, T4TOTAL, FREET4, T3FREE, THYROIDAB in the last 72 hours. Anemia Panel: No results for input(s): VITAMINB12, FOLATE, FERRITIN, TIBC, IRON, RETICCTPCT in the last 72 hours. Sepsis Labs: No results for input(s): PROCALCITON, LATICACIDVEN in the last 168 hours.  Recent Results (from the past 240 hour(s))  Surgical pcr screen     Status: None   Collection Time: 04/17/16  5:49 AM  Result Value Ref Range Status   MRSA, PCR NEGATIVE NEGATIVE Final   Staphylococcus aureus NEGATIVE NEGATIVE Final    Comment:        The Xpert SA Assay (FDA approved for NASAL specimens in patients over 67 years of age), is one component of a comprehensive surveillance program.  Test performance has been validated by [facility] for patients greater than or equal to 6  year old. It is not intended to diagnose infection nor to guide or monitor treatment.      Radiology Studies: Pelvis Portable  Result Date: 04/17/2016 CLINICAL DATA:  Status post right hip replacement EXAM: PORTABLE PELVIS 1-2 VIEWS COMPARISON:  04/15/2016 FINDINGS: Right hip prosthesis is now seen in satisfactory position. No acute bony or soft tissue abnormality is noted. IMPRESSION: Status post right hip replacement. Electronically Signed   By: Inez Catalina M.D.   On: 04/17/2016 13:22    Scheduled Meds: . aspirin EC  81 mg Oral BID  . docusate sodium  100 mg Oral BID  . feeding supplement (ENSURE ENLIVE)  237 mL Oral Q24H  . ferrous sulfate  325 mg Oral TID PC  . FLUoxetine  40 mg Oral Daily  . loratadine  10 mg Oral q morning - 10a  . midodrine  10 mg Oral TID WC  . omeprazole  20 mg Oral Daily  . potassium chloride  10 mEq Oral Daily  . traMADol  50 mg Oral BID   Continuous Infusions: . sodium chloride 10 mL/hr at 04/18/16 0752     LOS: 3 days   CHIU, Orpah Melter, MD [facility] Pager [phone]  If 7PM-7AM, please contact night-coverage [email or web address] Password [facility] 04/18/2016, 5:21 PM

## 2016-04-18 NOTE — Evaluation (Signed)
Physical Therapy Evaluation Patient Details Name: Sabrina Page MRN: [id number] DOB: [birth year] Today's Date: 04/18/2016   History of Present Illness  82 y.o. woman with a known history of orthostatic hypotension, HTN, HLD, COPD, asthma, and GERD who had an accidental fall in her apartment and sustained R hip fx. S/P R hip hemiarthroplasty (bipolar).   Clinical Impression  Pt admitted with above diagnosis. Pt currently with functional limitations due to the deficits listed below (see PT Problem List). Pt ambulated 30' with RW. +2 assist for bed mobility. SaO2 88% on RA walking, pt orthostatic with mild dizziness in standing (supine 141/73, sit 158/69, stand 88/55) which is baseline per pt's daughter. ST-SNF recommended.  Pt will benefit from skilled PT to increase their independence and safety with mobility to allow discharge to the venue listed below.       Follow Up Recommendations SNF;Supervision/Assistance - 24 hour    Equipment Recommendations  Rolling walker with 5" wheels    Recommendations for Other Services       Precautions / Restrictions Precautions Precautions: Fall;Posterior Hip Precaution Booklet Issued: Yes (comment) Precaution Comments: pt's daughter is a PTA and is aware of posterior hip precautions, she explained precautions to pt Restrictions Weight Bearing Restrictions: No Other Position/Activity Restrictions: WBAT RLE      Mobility  Bed Mobility Overal bed mobility: +2 for physical assistance;Needs Assistance Bed Mobility: Supine to Sit     Supine to sit: Max assist     General bed mobility comments: assist to raise trunk and support RLE, pt 50%  Transfers Overall transfer level: Needs assistance Equipment used: Rolling walker (2 wheeled) Transfers: Sit to/from Stand Sit to Stand: +2 safety/equipment;Mod assist         General transfer comment: VCs hand placement and hip precautions, assist to rise  Ambulation/Gait Ambulation/Gait  assistance: Min assist;+2 safety/equipment Ambulation Distance (Feet): 30 Feet Assistive device: Rolling walker (2 wheeled) Gait Pattern/deviations: Step-to pattern;Decreased step length - right;Decreased step length - left   Gait velocity interpretation: Below normal speed for age/gender General Gait Details: steady with RW, distance limited by fatigue and mild dizziness (pt orthostatic at baseline per daughter), VCs for sequencing; SaO2 88% on RA after walking, applied 2L O2 [place] came up to 99%  Stairs            Wheelchair Mobility    Modified Rankin (Stroke Patients Only)       Balance Overall balance assessment: Needs assistance;History of Falls   Sitting balance-Leahy Scale: Good       Standing balance-Leahy Scale: Poor                               Pertinent Vitals/Pain Pain Assessment: 0-10 Pain Score: 7  Pain Location: R hip with activity Pain Descriptors / Indicators: Sore Pain Intervention(s): Premedicated before session;Monitored during session;Limited activity within patient's tolerance;Ice applied    Home Living Family/patient expects to be discharged to:: Private residence Living Arrangements: Spouse/significant other Available Help at Discharge: Available PRN/intermittently;Personal care attendant Type of Home: Apartment Home Access: Level entry;Elevator   Entrance Stairs-Number of Steps: 1 STE if pt DCs to daughter's home Home Layout: One level Home Equipment: [place] - 4 wheels;Wheelchair - manual;Shower seat - built in;Bedside commode Additional Comments: per daughter, ambulates w/ RW, spouse has dementia    Prior Function Level of Independence: Independent with assistive device(s)  Hand Dominance        Extremity/Trunk Assessment   Upper Extremity Assessment: Overall WFL for tasks assessed           Lower Extremity Assessment: RLE deficits/detail RLE Deficits / Details: hip flexion 40* AAROM limited  by pain, ankle WNL, knee ext +3/5, hip 2/5    Cervical / Trunk Assessment: Normal  Communication   Communication: Prefers language other than English (pt speaks [place], has a few words in [place], daughter interpreted)  Cognition Arousal/Alertness: Awake/alert Behavior During Therapy: WFL for tasks assessed/performed Overall Cognitive Status: Within Functional Limits for tasks assessed                      General Comments      Exercises Total Joint Exercises Ankle Circles/Pumps: AROM;Both;10 reps;Supine Heel Slides: AAROM;Right;10 reps;Supine Hip ABduction/ADduction: AAROM;Right;10 reps;Supine Long Arc Quad: AROM;15 reps;Both;Seated   Assessment/Plan    PT Assessment Patient needs continued PT services  PT Problem List Decreased strength;Decreased range of motion;Decreased activity tolerance;Decreased balance;Pain;Decreased mobility;Decreased knowledge of precautions;Cardiopulmonary status limiting activity          PT Treatment Interventions DME instruction;Gait training;Functional mobility training;Balance training;Therapeutic exercise;Therapeutic activities;Patient/family education    PT Goals (Current goals can be found in the Care Plan section)  Acute Rehab PT Goals Patient Stated Goal: to be able to walk PT Goal Formulation: With patient/family Time For Goal Achievement: 05/02/16 Potential to Achieve Goals: Good    Frequency Min 6X/week   Barriers to discharge        Co-evaluation               End of Session Equipment Utilized During Treatment: Gait belt;Oxygen Activity Tolerance: Patient tolerated treatment well Patient left: in chair;with call bell/phone within reach Nurse Communication: Mobility status         Time: [id number] PT Time Calculation (min) (ACUTE ONLY): 38 min   Charges:   PT Evaluation $PT Eval Low Complexity: 1 Procedure PT Treatments $Gait Training: 8-22 mins $Therapeutic Exercise: 8-22 mins   PT G Codes:         Philomena Doheny 04/18/2016, 11:01 AM [phone]

## 2016-04-18 NOTE — Progress Notes (Signed)
Nocturnal CPAP equipment remains at bedside for use when more compliant with the mask. Daughter states the patient will only wear it for about an hour and then takes it off, even at home. RT will continue to follow.

## 2016-04-18 NOTE — Progress Notes (Signed)
   Subjective: 1 Day Post-Op Procedure(s) (LRB): ARTHROPLASTY BIPOLAR HIP (HEMIARTHROPLASTY) (Right) Patient sleeping but arouses easily.  Does not appear to be in any distress.  Smiling at time of exam. Speaks [place] but a few words in [place]. Patient seen in rounds for Dr. Alvan Dame. Patient with some confusion last night.  Daughter who translates for the patient is not in room at this time. We will start therapy today.   Objective: Vital signs in last 24 hours: Temp:  [97.4 F (36.3 C)-99.2 F (37.3 C)] 98.3 F (36.8 C) (11/26 0513) Pulse Rate:  [75-92] 79 (11/26 0513) Resp:  [16-22] 18 (11/26 0513) BP: (109-159)/(66-95) 158/84 (11/26 0513) SpO2:  [92 %-100 %] 99 % (11/26 0513)  Intake/Output from previous day:  Intake/Output Summary (Last 24 hours) at 04/18/16 0748 Last data filed at 04/18/16 0200  Gross per 24 hour  Intake          2801.25 ml  Output              575 ml  Net          2226.25 ml    Intake/Output this shift: No intake/output data recorded.  Labs:  Recent Labs  04/15/16 1555 04/18/16 0516  HGB 14.2 11.3*    Recent Labs  04/15/16 1555 04/18/16 0516  WBC 6.6 7.9  RBC 4.86 3.84*  HCT 44.6 34.8*  PLT 151 149*    Recent Labs  04/16/16 0338 04/18/16 0516  NA 137 137  K 4.3 4.4  CL 104 101  CO2 28 30  BUN 16 14  CREATININE 0.93 0.79  GLUCOSE 127* 127*  CALCIUM 8.5* 8.6*    Recent Labs  04/16/16 0338  INR 1.07    EXAM General - Patient is Alert and Appropriate Extremity - Neurovascular intact Sensation intact distally Intact pulses distally Dorsiflexion/Plantar flexion intact Dressing - dressing C/D/I Motor Function - intact, moving foot and toes well on exam.   Past Medical History:  Diagnosis Date  . Allergy    rhinitis  . Asthma   . Chronic kidney disease    hx of cystitis   . COPD (chronic obstructive pulmonary disease) ([place])   . Depression   . Dizziness    vertigo   . Gallstones   . GERD (gastroesophageal reflux  disease)   . Hyperlipidemia   . Hypertension   . Obesity   . Osteoarthritis   . Osteoporosis   . Peripheral vascular disease (HCC)    swelling   . Pneumonia   . Shortness of breath    with exertion   . Shoulder injury    left  . Sleep apnea    never diagnosed   . Urinary incontinence     Assessment/Plan: 1 Day Post-Op Procedure(s) (LRB): ARTHROPLASTY BIPOLAR HIP (HEMIARTHROPLASTY) (Right) Principal Problem:   Closed right hip fracture (HCC) Active Problems:   Asthma, moderate persistent, well-controlled   OSA on CPAP   Morbid obesity (HCC)   Orthostatic hypotension   Hip fracture (HCC)   Fall  Estimated body mass index is 36.39 kg/m as calculated from the following:   Height as of 04/02/16: 5\' 4"  (1.626 m).   Weight as of 04/02/16: 96.2 kg (212 lb). Advance diet Up with therapy  DVT Prophylaxis - Aspirin Weight Bearing As Tolerated right Leg Begin Therapy Hip Preacutions  Arlee Muslim, PA-C Orthopaedic Surgery 04/18/2016, 7:48 AM

## 2016-04-18 NOTE — Progress Notes (Signed)
OT  Note  Patient Details Name: Raven Groebner MRN: [id number] DOB: 1933-09-27   Cancelled Treatment:    Reason Eval/Treat Not Completed: Other (comment)   Noted PT suggested SNF- will check on pt next day for OT eval.    [place], Thereasa Parkin 04/18/2016, 12:11 PM

## 2016-04-19 ENCOUNTER — Encounter (HOSPITAL_COMMUNITY): Payer: Self-pay | Admitting: Orthopedic Surgery

## 2016-04-19 LAB — CBC
HCT: 34 % — ABNORMAL LOW (ref 36.0–46.0)
Hemoglobin: 10.8 g/dL — ABNORMAL LOW (ref 12.0–15.0)
MCH: 29.2 pg (ref 26.0–34.0)
MCHC: 31.8 g/dL (ref 30.0–36.0)
MCV: 91.9 fL (ref 78.0–100.0)
Platelets: 175 10*3/uL (ref 150–400)
RBC: 3.7 MIL/uL — ABNORMAL LOW (ref 3.87–5.11)
RDW: 12.8 % (ref 11.5–15.5)
WBC: 7.1 10*3/uL (ref 4.0–10.5)

## 2016-04-19 LAB — BASIC METABOLIC PANEL
Anion gap: 4 — ABNORMAL LOW (ref 5–15)
BUN: 17 mg/dL (ref 6–20)
CO2: 31 mmol/L (ref 22–32)
Calcium: 8.5 mg/dL — ABNORMAL LOW (ref 8.9–10.3)
Chloride: 102 mmol/L (ref 101–111)
Creatinine, Ser: 0.94 mg/dL (ref 0.44–1.00)
GFR calc Af Amer: >60 mL/min (ref >60)
GFR calc non Af Amer: 56 mL/min — ABNORMAL LOW (ref >60)
Glucose, Bld: 92 mg/dL (ref 65–99)
Potassium: 4.3 mmol/L (ref 3.5–5.1)
Sodium: 137 mmol/L (ref 135–145)

## 2016-04-19 LAB — URINALYSIS, ROUTINE W REFLEX MICROSCOPIC
Bilirubin Urine: NEGATIVE
Glucose, UA: NEGATIVE mg/dL
Hgb urine dipstick: NEGATIVE
Ketones, ur: NEGATIVE mg/dL
Leukocytes, UA: NEGATIVE
Nitrite: NEGATIVE
Protein, ur: NEGATIVE mg/dL
Specific Gravity, Urine: 1.013 (ref 1.005–1.030)
pH: 7.5 (ref 5.0–8.0)

## 2016-04-19 LAB — GLUCOSE, CAPILLARY
Glucose-Capillary: 90 mg/dL (ref 65–99)
Glucose-Capillary: 120 mg/dL — ABNORMAL HIGH (ref 65–99)

## 2016-04-19 MED ORDER — HYDROCODONE-ACETAMINOPHEN 5-325 MG PO TABS: 1.0000 | ORAL_TABLET | Freq: Four times a day (QID) | ORAL | 0 refills | Status: DC | PRN

## 2016-04-19 MED ORDER — ASPIRIN 81 MG PO TBEC: 81.0000 mg | DELAYED_RELEASE_TABLET | Freq: Two times a day (BID) | ORAL | 0 refills | Status: DC

## 2016-04-19 NOTE — Clinical Social Work Placement (Signed)
   CLINICAL SOCIAL WORK PLACEMENT  NOTE  Date:  04/19/2016  Patient Details  Name: Sabrina Page MRN: [id number] Date of Birth: [birth year]  Clinical Social Work is seeking post-discharge placement for this patient at the [place] level of care (*CSW will initial, date and re-position this form in  chart as items are completed):  Yes   Patient/family provided with [place] Work Department's list of facilities offering this level of care within the geographic area requested by the patient (or if unable, by the patient's family).  Yes   Patient/family informed of their freedom to choose among providers that offer the needed level of care, that participate in Medicare, Medicaid or managed care program needed by the patient, have an available bed and are willing to accept the patient.      Patient/family informed of [facility]'s ownership interest in [facility] and [facility], as well as of the fact that they are under no obligation to receive care at these facilities.  PASRR submitted to EDS on 04/19/16     PASRR number received on 04/19/16     Existing PASRR number confirmed on       FL2 transmitted to all facilities in geographic area requested by pt/family on 04/19/16     FL2 transmitted to all facilities within larger geographic area on       Patient informed that his/her managed care company has contracts with or will negotiate with certain facilities, including the following:            Patient/family informed of bed offers received.  Patient chooses bed at       Physician recommends and patient chooses bed at      Patient to be transferred to   on  .  Patient to be transferred to facility by       Patient family notified on   of transfer.  Name of family member notified:        PHYSICIAN Please sign FL2     Additional Comment:    _______________________________________________ Glendon Axe A 04/19/2016,  11:08 AM

## 2016-04-19 NOTE — Care Management Important Message (Signed)
Important Message  Patient Details  Name: Sabrina Page MRN: [id number] Date of Birth: 1934/04/11   Medicare Important Message Given:  Yes    Camillo Flaming 04/19/2016, 10:35 AMImportant Message  Patient Details  Name: Sabrina Page MRN: [id number] Date of Birth: [birth year]   Medicare Important Message Given:  Yes    Camillo Flaming 04/19/2016, 10:34 AM

## 2016-04-19 NOTE — NC FL2 (Signed)
[place] LEVEL OF CARE SCREENING TOOL     IDENTIFICATION  Patient Name: Sabrina Page Birthdate: February 04, 1934 Sex: female Admission Date (Current Location): 04/15/2016  [facility] and [place] Number:  Herbalist and Address:  [facility],  [place], [place]      Provider Number: [id number]  Attending Physician Name and Address:  Donne Hazel, MD  Relative Name and Phone Number:       Current Level of Care: Hospital Recommended Level of Care: [place] Prior Approval Number:    Date Approved/Denied:   PASRR Number:   [id number]  Discharge Plan: SNF    Current Diagnoses: Patient Active Problem List   Diagnosis Date Noted  . Fall   . Closed right hip fracture ([place]) 04/15/2016  . Hip fracture ([place]) 04/15/2016  . Near syncope 01/18/2016  . Orthostatic hypotension 01/18/2016  . Hip pain, chronic 12/25/2015  . Carotid bruit 06/26/2015  . Fall at home 02/01/2015  . Concussion without loss of consciousness 09/13/2014  . Morbid obesity ([place]) 03/06/2014  . Right tennis elbow 01/02/2014  . Wart 01/02/2014  . Chronic fatigue 06/25/2013  . External hemorrhoid, bleeding 02/14/2013  . OSA on CPAP 02/10/2012  . LBP radiating to both legs 05/14/2011  . Falls frequently 05/14/2011  . Fatigue 04/22/2011  . Hyperglycemia 04/22/2011  . Neoplasm of uncertain behavior of skin 01/11/2011  . Anxiety state 07/06/2010  . SHOULDER PAIN 05/21/2010  . CONCUSSION WITH LOSS OF CONSCIOUSNESS 05/21/2010  . Actinic keratosis 04/08/2010  . CARPAL TUNNEL SYNDROME 11/28/2009  . NECK PAIN 11/28/2009  . DIZZINESS 11/28/2009  . INSOMNIA, CHRONIC 06/23/2009  . TOBACCO USE, QUIT 04/11/2009  . DIVERTICULOSIS OF COLON 09/06/2008  . DIVERTICULITIS OF COLON 09/06/2008  . Unspecified chest pain 09/06/2008  . GERD 06/05/2008  . OSTEOARTHRITIS 06/05/2008  . ABNORMAL GLUCOSE NEC 06/05/2008  . BRONCHITIS, ACUTE 04/04/2008  .  APHTHOUS STOMATITIS 03/25/2008  . Venous (peripheral) insufficiency 11/03/2007  . Edema 11/03/2007  . SHINGLES 10/23/2007  . DENTAL PAIN 10/23/2007  . PARESTHESIA 10/23/2007  . Pain in limb 07/06/2007  . HYPERLIPIDEMIA 02/17/2007  . Depression 02/17/2007  . Essential hypertension 02/17/2007  . ALLERGIC RHINITIS 02/17/2007  . Asthma, moderate persistent, well-controlled 02/17/2007  . Dyspnea on exertion 02/17/2007  . SYMPTOM, INCONTINENCE, URGE 02/17/2007    Orientation RESPIRATION BLADDER Height & Weight     Self, Situation, Place  O2 (at 2L's ) Incontinent Weight:   Height:     BEHAVIORAL SYMPTOMS/MOOD NEUROLOGICAL BOWEL NUTRITION STATUS   (none )  (none ) Continent Diet (Heart Healthy )  AMBULATORY STATUS COMMUNICATION OF NEEDS Skin   Extensive Assist Verbally Surgical wounds                       Personal Care Assistance Level of Assistance  Dressing, Bathing, Feeding Bathing Assistance: Maximum assistance Feeding assistance: Independent Dressing Assistance: Maximum assistance     Functional Limitations Info  Speech, Hearing, Sight Sight Info: Adequate Hearing Info: Adequate Speech Info: Adequate    SPECIAL CARE FACTORS FREQUENCY  PT (By licensed PT), OT (By licensed OT)     PT Frequency: 6x OT Frequency: 2x            Contractures      Additional Factors Info  Code Status, Allergies Code Status Info: FULL CODE  Allergies Info: Atorvastatin, Enalapril Maleate, Hctz Hydrochlorothiazide, Lovastatin, Simvastatin, Topamax Topiramate  Current Medications (04/19/2016):  This is the current hospital active medication list Current Facility-Administered Medications  Medication Dose Route Frequency Provider Last Rate Last Dose  . 0.9 %  sodium chloride infusion   Intravenous Continuous Alexzandrew L Perkins, PA-C 10 mL/hr at 04/18/16 0752    . acetaminophen (TYLENOL) tablet 650 mg  650 mg Oral Q6H PRN Paralee Cancel, MD   650 mg at 04/18/16 1756    Or  . acetaminophen (TYLENOL) suppository 650 mg  650 mg Rectal Q6H PRN Paralee Cancel, MD      . acetaminophen (TYLENOL) tablet 650 mg  650 mg Oral Q6H PRN Lily Kocher, MD   650 mg at 04/16/16 1311  . alum & mag hydroxide-simeth (MAALOX/MYLANTA) 200-200-20 MG/5ML suspension 30 mL  30 mL Oral Q4H PRN Paralee Cancel, MD      . aspirin EC tablet 81 mg  81 mg Oral BID Paralee Cancel, MD   81 mg at 04/19/16 0940  . clonazePAM (KLONOPIN) tablet 0.5 mg  0.5 mg Oral QHS PRN Lily Kocher, MD   0.5 mg at 04/17/16 2243  . docusate sodium (COLACE) capsule 100 mg  100 mg Oral BID Paralee Cancel, MD   100 mg at 04/19/16 0940  . feeding supplement (ENSURE ENLIVE) (ENSURE ENLIVE) liquid 237 mL  237 mL Oral Q24H Donne Hazel, MD   237 mL at 04/18/16 1600  . ferrous sulfate tablet 325 mg  325 mg Oral TID PC Paralee Cancel, MD   325 mg at 04/19/16 0941  . FLUoxetine (PROZAC) capsule 40 mg  40 mg Oral Daily Lily Kocher, MD   40 mg at 04/19/16 0941  . HYDROcodone-acetaminophen (NORCO/VICODIN) 5-325 MG per tablet 1-2 tablet  1-2 tablet Oral Q6H PRN Paralee Cancel, MD   2 tablet at 04/18/16 [phone]  . HYDROmorphone (DILAUDID) injection 0.5-1 mg  0.5-1 mg Intravenous Q3H PRN Paralee Cancel, MD      . HYDROmorphone (DILAUDID) injection 1 mg  1 mg Intravenous Q4H PRN Donne Hazel, MD   1 mg at 04/16/16 1517  . ipratropium-albuterol (DUONEB) 0.5-2.5 (3) MG/3ML nebulizer solution 3 mL  3 mL Nebulization Q4H PRN Lily Kocher, MD      . ketorolac (TORADOL) 15 MG/ML injection 15 mg  15 mg Intravenous Q6H PRN Donne Hazel, MD   15 mg at 04/19/16 0100  . loratadine (CLARITIN) tablet 10 mg  10 mg Oral q morning - 10a Lily Kocher, MD   10 mg at 04/19/16 0941  . menthol-cetylpyridinium (CEPACOL) lozenge 3 mg  1 lozenge Oral PRN Paralee Cancel, MD       Or  . phenol (CHLORASEPTIC) mouth spray 1 spray  1 spray Mouth/Throat PRN Paralee Cancel, MD      . methocarbamol (ROBAXIN) tablet 500 mg  500 mg Oral Q6H PRN Paralee Cancel, MD       Or  .  methocarbamol (ROBAXIN) 500 mg in dextrose 5 % 50 mL IVPB  500 mg Intravenous Q6H PRN Paralee Cancel, MD      . metoCLOPramide (REGLAN) tablet 5-10 mg  5-10 mg Oral Q8H PRN Paralee Cancel, MD       Or  . metoCLOPramide (REGLAN) injection 5-10 mg  5-10 mg Intravenous Q8H PRN Paralee Cancel, MD      . midodrine (PROAMATINE) tablet 10 mg  10 mg Oral TID WC Lily Kocher, MD   10 mg at 04/19/16 1013  . omeprazole (PRILOSEC) capsule 20 mg  20 mg Oral Daily Alexzandrew L  Perkins, PA-C   20 mg at 04/19/16 0940  . ondansetron (ZOFRAN) tablet 4 mg  4 mg Oral Q6H PRN Paralee Cancel, MD       Or  . ondansetron [facility]) injection 4 mg  4 mg Intravenous Q6H PRN Paralee Cancel, MD      . polyethylene glycol (MIRALAX / GLYCOLAX) packet 17 g  17 g Oral Daily PRN Paralee Cancel, MD   17 g at 04/18/16 1025  . polyvinyl alcohol (LIQUIFILM TEARS) 1.4 % ophthalmic solution 1 drop  1 drop Both Eyes Daily PRN Lily Kocher, MD      . potassium chloride (K-DUR) CR tablet 10 mEq  10 mEq Oral Daily Donne Hazel, MD   10 mEq at 04/19/16 0940  . traMADol (ULTRAM) tablet 50 mg  50 mg Oral BID Lily Kocher, MD   50 mg at 04/19/16 0940     Discharge Medications: Please see discharge summary for a list of discharge medications.  Relevant Imaging Results:  Relevant Lab Results:   Additional Information SSN [id number]  Glendon Axe A

## 2016-04-19 NOTE — Clinical Social Work Note (Signed)
Clinical Social Work Assessment  Patient Details  Name: Sabrina Page MRN: [id number] Date of Birth: 1933/09/28  Date of referral:  04/19/16               Reason for consult:  Facility Placement, Discharge Planning                Permission sought to share information with:  Family Supports, Customer service manager, Case Optician, dispensing granted to share information::  Yes, Verbal Permission Granted  Name::      (Sabrina Page)  Agency::   (SNF's )  Relationship::   (Daughter )  Contact Information:   [phone])  Housing/Transportation Living arrangements for the past 2 months:  Single Family Home Source of Information:  Adult Children Patient Interpreter Needed:  None Criminal Activity/Legal Involvement Pertinent to Current Situation/Hospitalization:  No - Comment as needed Significant Relationships:  Adult Children, Spouse Lives with:  Spouse, Adult Children Do you feel safe going back to the place where you live?  No Need for family participation in patient care:  Yes (Comment)  Care giving concerns: Patient admitted from home with family, currently require STR at skilled level.    Social Worker assessment / plan:  MSW met with patient and family for post-acute placement for SNF. MSW introduced MSW role and SNF process. Pt's dtr, Sabrina Page is familiar with SNF's/STR and is a Engineer, manufacturing. Pt's dtr prefers: Sabrina Page at Sabrina Page, Sabrina Page, or Sabrina Page (in this order). MSW completed FL-2 and faxed via [place], MSW also contacted admissions director at Sabrina Page at Sabrina Page and left a message for a returned phone call. MSW reviewed and provided Advanced Directives packets. Patient and dtr aware not to sign until witness/notary is available. MSW paged chaplin who is a notary to further assist with documents. No further concerns reported at this time. MSW will continue to follow pt and pt's family for continued  support and to facilitate pt's discharge needs once medically stable.   Employment status:  Retired Forensic scientist:  Medicare PT Recommendations:  Sabrina Page / Referral to community resources:  Sabrina Page  Patient/Family's Response to care:  Pt a/o x3. Patient smiling during assessment and agreeable to STR at SNF. Pt's dtr and spouse are supportive and strongly involved in care. Pt and family pleasant and appreciated social work intervention.   Patient/Family's Understanding of and Emotional Response to Diagnosis, Current Treatment, and Prognosis:  Patient's dtr knowledgeable of medical interventions.   Emotional Assessment Appearance:  Appears younger than stated age Attitude/Demeanor/Rapport:   (Polite ) Affect (typically observed):  Accepting, Appropriate, Pleasant Orientation:  Oriented to Situation, Oriented to  Time, Oriented to Place, Oriented to Self Alcohol / Substance use:  Not Applicable Psych involvement (Current and /or in the community):  No (Comment)  Discharge Needs  Concerns to be addressed:  Care Coordination Readmission within the last 30 days:  No Current discharge risk:  Dependent with Mobility Barriers to Discharge:  Continued Medical Work up   Sabrina Page A 04/19/2016, 11:24 AM

## 2016-04-19 NOTE — Progress Notes (Signed)
PROGRESS NOTE    Sabrina Page  [name] DOB: Nov 26, 1933 DOA: 04/15/2016 PCP: [name], MD    Brief Narrative:  82 y.o. woman with a known history of orthostatic hypotension, HTN, HLD, COPD, asthma, and GERD who had an accidental fall this morning in her apartment.  She speaks [place], and her daughter is at bedside to interpret (formal interpreter services were declined).  The patient denies any preceding aura, headache, chest pain.  She recalls slipping (she was attempting to ambulate in compression hose) while trying to sit down at the table.  She denies loss of consciousness.  She admits that she will get dizzy when she is orthostatic.  Reportedly, her BP at home this morning went from 120/80 when sitting to 70/50 when standing  Assessment & Plan:   Principal Problem:   Closed right hip fracture (HCC) Active Problems:   Asthma, moderate persistent, well-controlled   OSA on CPAP   Morbid obesity (HCC)   Orthostatic hypotension   Hip fracture ([place])   Fall   Right hip fracture, accidental fall reported but also known orthostasis -Continue analgesics as needed -Orthopedic surgery is following and patient underwent surgery on 11/25 -Remains stable at present. -Orthopedic surgery input reviewed. Patient is cleared for discharge from surgical standpoint  Shortness of breath -Denies shortness of breath at present -Continued on when necessary DuoNeb's  OSA Patient to continue daily at bedtime CPAP -Chart reviewed. Patient noted to be removing her CPAP mask multiple times overnight  Orthostatic hypotension -Continue midodrine 10mg  TID -Recent orthostatic vital signs demonstrate orthostasis -Avoid aggressive hydration secondary to history of diastolic congestive heart failure  Acute delirium -Presently with baseline mentation -On further questioning, patient's mentation seems to worsen greatest at nighttime when she removes her CPAP. -Suspect  hypercarbia/hypoxia as primary etiology of acute delirium -Have recommended patient to be more adherent to her CPAP  Prominent heart and mediastinum on chest x-ray -Noted on presenting chest x-ray findings. -Radiology recommendations for PA and lateral chest x-ray prior to discharge to rule out pulmonary venous congestion versus technical differences. -Reviewed PA and lateral chest x-ray. Findings of enlarged heart and vascular congestion. No overt edema (see below)  Grade 2 diastolic congestive heart failure, likely chronic -Most recent 2-D echocardiogram from August 2017 demonstrates EF of 55-60% with grade 2 diastolic dysfunction -PA and lateral chest x-ray during this admission demonstrates an enlarged heart and vascular congestion -Suspect diastolic congestive heart failure related to poorly treated obstructive sleep apnea -Per above, have advised patient to be more adherent to her CPAP  DVT prophylaxis: SCDs Code Status: Full code Family Communication: Patient's daughter at bedside Disposition Plan: Uncertain at this time, likely will need skilled nursing facility at times discharge  Consultants:   Orthopedic surgery  Procedures:   R hemiarthoplasty 11/25  Antimicrobials: Anti-infectives    Start     Dose/Rate Route Frequency Ordered Stop   04/17/16 1800  ceFAZolin (ANCEF) IVPB 1 g/50 mL premix     1 g 100 mL/hr over 30 Minutes Intravenous Every 6 hours 04/17/16 1504 04/18/16 0039   04/16/16 1045  ceFAZolin (ANCEF) IVPB 2g/100 mL premix  Status:  Discontinued     2 g 200 mL/hr over 30 Minutes Intravenous On call to O.R. 04/16/16 1031 04/17/16 1437      Subjective: Patient reports feeling good today  Objective: Vitals:   04/18/16 2141 04/19/16 0659 04/19/16 0700 04/19/16 1446  BP: (!) 162/64  (!) 108/53 (!) 143/55  Pulse: 84  88 71  Resp:   18 18  Temp: 98.5 F (36.9 C) 98.1 F (36.7 C)  98.2 F (36.8 C)  TempSrc: Oral  Oral Oral  SpO2: 99%  91% 91%     Intake/Output Summary (Last 24 hours) at 04/19/16 1641 Last data filed at 04/19/16 0600  Gross per 24 hour  Intake                0 ml  Output                0 ml  Net                0 ml   There were no vitals filed for this visit.  Examination:  General exam: Lying in bed, pleasant, conversant Respiratory system: Normal chest rise, clear to auscultation Cardiovascular system: Regular rhythm, S1-S2 Gastrointestinal system: Soft, nondistended, positive bowel sounds and obese Central nervous system: CN II through XII grossly intact, sensation intact Extremities: No clubbing, perfused Skin: Normal skin turgor, no notable skin lesions seen Psychiatry: Affect normal, no visual hallucinations  Data Reviewed: I have personally reviewed following labs and imaging studies  CBC:  Recent Labs Lab 04/15/16 1555 04/18/16 0516 04/19/16 0459  WBC 6.6 7.9 7.1  NEUTROABS 4.6  --   --   HGB 14.2 11.3* 10.8*  HCT 44.6 34.8* 34.0*  MCV 91.8 90.6 91.9  PLT 151 149* 0000000   Basic Metabolic Panel:  Recent Labs Lab 04/15/16 1555 04/16/16 0338 04/18/16 0516 04/19/16 0459  NA 139 137 137 137  K 4.0 4.3 4.4 4.3  CL 106 104 101 102  CO2 28 28 30 31   GLUCOSE 108* 127* 127* 92  BUN 15 16 14 17   CREATININE 0.95 0.93 0.79 0.94  CALCIUM 8.4* 8.5* 8.6* 8.5*   GFR: CrCl cannot be calculated (Unknown ideal weight.). Liver Function Tests:  Recent Labs Lab 04/16/16 0338  AST 26  ALT 22  ALKPHOS 48  BILITOT 0.9  PROT 6.3*  ALBUMIN 3.7   No results for input(s): LIPASE, AMYLASE in the last 168 hours. No results for input(s): AMMONIA in the last 168 hours. Coagulation Profile:  Recent Labs Lab 04/16/16 0338  INR 1.07   Cardiac Enzymes: No results for input(s): CKTOTAL, CKMB, CKMBINDEX, TROPONINI in the last 168 hours. BNP (last 3 results) No results for input(s): PROBNP in the last 8760 hours. HbA1C: No results for input(s): HGBA1C in the last 72 hours. CBG:  Recent  Labs Lab 04/19/16 0759  GLUCAP 90   Lipid Profile: No results for input(s): CHOL, HDL, LDLCALC, TRIG, CHOLHDL, LDLDIRECT in the last 72 hours. Thyroid Function Tests: No results for input(s): TSH, T4TOTAL, FREET4, T3FREE, THYROIDAB in the last 72 hours. Anemia Panel: No results for input(s): VITAMINB12, FOLATE, FERRITIN, TIBC, IRON, RETICCTPCT in the last 72 hours. Sepsis Labs: No results for input(s): PROCALCITON, LATICACIDVEN in the last 168 hours.  Recent Results (from the past 240 hour(s))  Surgical pcr screen     Status: None   Collection Time: 04/17/16  5:49 AM  Result Value Ref Range Status   MRSA, PCR NEGATIVE NEGATIVE Final   Staphylococcus aureus NEGATIVE NEGATIVE Final    Comment:        The Xpert SA Assay (FDA approved for NASAL specimens in patients over 62 years of age), is one component of a comprehensive surveillance program.  Test performance has been validated by [facility] for patients greater than or equal to 34 year old. It is not  intended to diagnose infection nor to guide or monitor treatment.      Radiology Studies: Dg Chest 2 View  Result Date: 04/18/2016 CLINICAL DATA:  Status post hip surgery, previous abnormal chest x-ray history of asthma EXAM: CHEST  2 VIEW COMPARISON:  04/15/2016, 01/18/2016 FINDINGS: AP semi-erect and lateral views of the chest are obtained. There is mild cardiomegaly along with mild central vascular congestion, similar compared to 04/15/2016, new compared with 01/18/2016. No consolidation or effusion. No pneumothorax. Degenerative changes of the spine. IMPRESSION: Mild cardiomegaly with mild central vascular congestion. No overt pulmonary edema or pleural effusion. Electronically Signed   By: Donavan Foil M.D.   On: 04/18/2016 20:04    Scheduled Meds: . aspirin EC  81 mg Oral BID  . docusate sodium  100 mg Oral BID  . feeding supplement (ENSURE ENLIVE)  237 mL Oral Q24H  . ferrous sulfate  325 mg Oral TID PC  .  FLUoxetine  40 mg Oral Daily  . loratadine  10 mg Oral q morning - 10a  . midodrine  10 mg Oral TID WC  . omeprazole  20 mg Oral Daily  . potassium chloride  10 mEq Oral Daily  . traMADol  50 mg Oral BID   Continuous Infusions: . sodium chloride 10 mL/hr at 04/18/16 0752     LOS: 4 days   CHIU, Orpah Melter, MD [facility] Pager [phone]  If 7PM-7AM, please contact night-coverage [email or web address] Password [facility] 04/19/2016, 4:41 PM

## 2016-04-19 NOTE — Clinical Social Work Note (Signed)
MSW presented bed offers to patient's dtr, [name].  SNF's: [facility].   Prefers [facility] [place], MSW contacted facility and left two messages. Facility has declined on the HUB (reason not indicated). Patient's dtr stated that she will contact someone in regards to bed offer from [place].  MSW remains available as needed.  Glendon Axe, MSW [phone] 04/19/2016 2:08 PM

## 2016-04-19 NOTE — Progress Notes (Signed)
Patient ID: Sabrina Page, female   DOB: 1933-07-18, 82 y.o.   MRN: [id number]   Subjective: 2 Days Post-Op Procedure(s) (LRB): ARTHROPLASTY BIPOLAR HIP (HEMIARTHROPLASTY) (Right)    Daughter and husband in room.  Report post-op confusion (worried about it) No other events reported  Objective:   VITALS:   Vitals:   04/19/16 0700 04/19/16 1446  BP: (!) 108/53 (!) 143/55  Pulse: 88 71  Resp: 18 18  Temp:  98.2 F (36.8 C)    Neurovascular intact Incision: dressing C/D/I  LABS  Recent Labs  04/18/16 0516 04/19/16 0459  HGB 11.3* 10.8*  HCT 34.8* 34.0*  WBC 7.9 7.1  PLT 149* 175     Recent Labs  04/18/16 0516 04/19/16 0459  NA 137 137  K 4.4 4.3  BUN 14 17  CREATININE 0.79 0.94  GLUCOSE 127* 92    No results for input(s): LABPT, INR in the last 72 hours.   Assessment/Plan: 2 Days Post-Op Procedure(s) (LRB): ARTHROPLASTY BIPOLAR HIP (HEMIARTHROPLASTY) (Right)   Advance diet Up with therapy Discharge home with home health  Family would like very much to get her home as opposed to SNF.  Best to try and let her return to normal mental state and then see if she can function with PT Family willing to provide care at home

## 2016-04-19 NOTE — Progress Notes (Signed)
OT  Note  Patient Details Name: Deavon Sanford MRN: [id number] DOB: 12/05/1933   Cancelled Treatment:    Reason Eval/Treat Not Completed: Other (comment) Noted plans for SNF- will defer OT eval to SNF  [facility], [place]  Betsy Pries 04/19/2016, 12:57 PM

## 2016-04-20 DIAGNOSIS — S7291XD Unspecified fracture of right femur, subsequent encounter for closed fracture with routine healing: Secondary | ICD-10-CM | POA: Diagnosis not present

## 2016-04-20 DIAGNOSIS — Z471 Aftercare following joint replacement surgery: Secondary | ICD-10-CM | POA: Diagnosis not present

## 2016-04-20 DIAGNOSIS — S728X9A Other fracture of unspecified femur, initial encounter for closed fracture: Secondary | ICD-10-CM | POA: Diagnosis not present

## 2016-04-20 DIAGNOSIS — Z9181 History of falling: Secondary | ICD-10-CM | POA: Diagnosis not present

## 2016-04-20 DIAGNOSIS — I5032 Chronic diastolic (congestive) heart failure: Secondary | ICD-10-CM | POA: Diagnosis not present

## 2016-04-20 DIAGNOSIS — S72001A Fracture of unspecified part of neck of right femur, initial encounter for closed fracture: Secondary | ICD-10-CM | POA: Diagnosis not present

## 2016-04-20 DIAGNOSIS — S72001D Fracture of unspecified part of neck of right femur, subsequent encounter for closed fracture with routine healing: Secondary | ICD-10-CM | POA: Diagnosis not present

## 2016-04-20 DIAGNOSIS — E785 Hyperlipidemia, unspecified: Secondary | ICD-10-CM | POA: Diagnosis not present

## 2016-04-20 DIAGNOSIS — G4733 Obstructive sleep apnea (adult) (pediatric): Secondary | ICD-10-CM | POA: Diagnosis not present

## 2016-04-20 DIAGNOSIS — R262 Difficulty in walking, not elsewhere classified: Secondary | ICD-10-CM | POA: Diagnosis not present

## 2016-04-20 DIAGNOSIS — J449 Chronic obstructive pulmonary disease, unspecified: Secondary | ICD-10-CM | POA: Diagnosis not present

## 2016-04-20 DIAGNOSIS — I951 Orthostatic hypotension: Secondary | ICD-10-CM | POA: Diagnosis not present

## 2016-04-20 DIAGNOSIS — K219 Gastro-esophageal reflux disease without esophagitis: Secondary | ICD-10-CM | POA: Diagnosis not present

## 2016-04-20 DIAGNOSIS — Z96649 Presence of unspecified artificial hip joint: Secondary | ICD-10-CM | POA: Diagnosis not present

## 2016-04-20 DIAGNOSIS — I10 Essential (primary) hypertension: Secondary | ICD-10-CM | POA: Diagnosis not present

## 2016-04-20 DIAGNOSIS — Z96641 Presence of right artificial hip joint: Secondary | ICD-10-CM | POA: Diagnosis not present

## 2016-04-20 DIAGNOSIS — G4730 Sleep apnea, unspecified: Secondary | ICD-10-CM | POA: Diagnosis not present

## 2016-04-20 LAB — CBC
HCT: 33.9 % — ABNORMAL LOW (ref 36.0–46.0)
Hemoglobin: 10.9 g/dL — ABNORMAL LOW (ref 12.0–15.0)
MCH: 29.5 pg (ref 26.0–34.0)
MCHC: 32.2 g/dL (ref 30.0–36.0)
MCV: 91.9 fL (ref 78.0–100.0)
Platelets: 177 10*3/uL (ref 150–400)
RBC: 3.69 MIL/uL — ABNORMAL LOW (ref 3.87–5.11)
RDW: 12.8 % (ref 11.5–15.5)
WBC: 6.5 10*3/uL (ref 4.0–10.5)

## 2016-04-20 MED ORDER — FERROUS SULFATE 325 (65 FE) MG PO TABS: 325.0000 mg | ORAL_TABLET | Freq: Three times a day (TID) | ORAL | 0 refills | Status: DC

## 2016-04-20 MED ORDER — CLONAZEPAM 0.5 MG PO TABS: 0.5000 mg | ORAL_TABLET | Freq: Every evening | ORAL | 0 refills | Status: DC | PRN

## 2016-04-20 NOTE — Clinical Social Work Placement (Signed)
CSW confirmed with Olin Hauser at [facility] [place] that they are able to offer a bed. Dr. Wyline Copas made aware. Awaiting discharge.    Raynaldo Opitz, [facility] Clinical Social Worker cell #: [phone]    CLINICAL SOCIAL WORK PLACEMENT  NOTE  Date:  04/20/2016  Patient Details  Name: Sabrina Page MRN: [id number] Date of Birth: 06/11/1933  Clinical Social Work is seeking post-discharge placement for this patient at the [place] level of care (*CSW will initial, date and re-position this form in  chart as items are completed):  Yes   Patient/family provided with [facility] Work Department's list of facilities offering this level of care within the geographic area requested by the patient (or if unable, by the patient's family).  Yes   Patient/family informed of their freedom to choose among providers that offer the needed level of care, that participate in Medicare, Medicaid or managed care program needed by the patient, have an available bed and are willing to accept the patient.      Patient/family informed of [facility]'s ownership interest in [facility] and [facility], as well as of the fact that they are under no obligation to receive care at these facilities.  PASRR submitted to EDS on 04/19/16     PASRR number received on 04/19/16     Existing PASRR number confirmed on       FL2 transmitted to all facilities in geographic area requested by pt/family on 04/19/16     FL2 transmitted to all facilities within larger geographic area on       Patient informed that his/her managed care company has contracts with or will negotiate with certain facilities, including the following:        Yes   Patient/family informed of bed offers received.  Patient chooses bed at [facility] at [place] recommends and patient chooses bed at      Patient to be transferred to [facility] [place] on   .  Patient to be transferred to facility by       Patient family notified on   of transfer.  Name of family member notified:        PHYSICIAN Please sign FL2     Additional Comment:    _______________________________________________ Standley Brooking, LCSW 04/20/2016, 12:31 PM

## 2016-04-20 NOTE — Progress Notes (Signed)
Physical Therapy Treatment Patient Details Name: Sabrina Page MRN: [id number] DOB: 1933-12-08 Today's Date: 04/20/2016    History of Present Illness 82 y.o. woman with a known history of orthostatic hypotension, HTN, HLD, COPD, asthma, and GERD who had an accidental fall in her apartment and sustained R hip fx. S/P R hip hemiarthroplasty (bipolar).     PT Comments    Decreased assistance for bed mobility today. Pt continues to have orthostatic hypotension (which is baseline). Standing BP at 0 min 64/46, at 3 min 89/71. Dizziness and fatigue limited ambulation today, however pt performed marching in standing and minisquats.    Follow Up Recommendations  SNF;Supervision/Assistance - 24 hour     Equipment Recommendations  Rolling walker with 5" wheels    Recommendations for Other Services       Precautions / Restrictions Precautions Precautions: Fall;Posterior Hip Precaution Booklet Issued: Yes (comment) Precaution Comments: pt's daughter is a PTA and is aware of posterior hip precautions, reviewed hip precautions with pt as she was not able to recall any of them Restrictions Weight Bearing Restrictions: No Other Position/Activity Restrictions: WBAT RLE    Mobility  Bed Mobility Overal bed mobility: Needs Assistance Bed Mobility: Sit to Supine       Sit to supine: Supervision   General bed mobility comments: VCs for technique, pt able to lift RLE into bed without assist, increased time  Transfers Overall transfer level: Needs assistance Equipment used: Rolling walker (2 wheeled) Transfers: Sit to/from Stand Sit to Stand: +2 safety/equipment;Mod assist         General transfer comment: VCs hand placement and hip precautions, assist to rise, pt dizzy upon standing. BP standing 64/46 at 0 minutes, 89/71 at 3 minutes HR 108. Performed marching in standing and minisquats in standing. Pt reported she's too fatigued to walk.   Ambulation/Gait Ambulation/Gait  assistance: +2 safety/equipment;Min assist Ambulation Distance (Feet): 3 Feet Assistive device: Rolling walker (2 wheeled) Gait Pattern/deviations: Step-to pattern     General Gait Details: steady with RW, distance limited by fatigue and mild dizziness (pt orthostatic at baseline per daughter), VCs for sequencing and precautions   Stairs            Wheelchair Mobility    Modified Rankin (Stroke Patients Only)       Balance     Sitting balance-Leahy Scale: Good       Standing balance-Leahy Scale: Poor                      Cognition Arousal/Alertness: Awake/alert Behavior During Therapy: WFL for tasks assessed/performed Overall Cognitive Status: Within Functional Limits for tasks assessed                      Exercises Total Joint Exercises Ankle Circles/Pumps: AROM;Both;10 reps;Supine Quad Sets: AROM;Right;5 reps;Supine Short Arc Quad: AROM;Right;15 reps;Supine Heel Slides: AAROM;Right;10 reps;Supine Hip ABduction/ADduction: AAROM;Right;10 reps;Supine;AROM Straight Leg Raises: AROM;AAROM;Right;10 reps;Supine Marching in standing x 5 B minisquats in standing with RW x 5   General Comments        Pertinent Vitals/Pain Pain Score: 5  Pain Location: R hip with activity Pain Descriptors / Indicators: Sore Pain Intervention(s): Limited activity within patient's tolerance;Monitored during session;Ice applied;Premedicated before session    Home Living                      Prior Function            PT Goals (current goals  can now be found in the care plan section) Acute Rehab PT Goals Patient Stated Goal: to be able to walk PT Goal Formulation: With patient/family Time For Goal Achievement: 05/02/16 Potential to Achieve Goals: Good Progress towards PT goals: Progressing toward goals    Frequency    Min 3X/week      PT Plan Current plan remains appropriate    Co-evaluation             End of Session Equipment  Utilized During Treatment: Gait belt Activity Tolerance: Patient limited by fatigue;Treatment limited secondary to medical complications (Comment) (orthostatic hypotension) Patient left: in chair;with call bell/phone within reach     Time: 0923-0958 PT Time Calculation (min) (ACUTE ONLY): 35 min  Charges:  $Therapeutic Exercise: 8-22 mins $Therapeutic Activity: 8-22 mins                    G Codes:      Philomena Doheny 04/20/2016, 10:06 AM [phone]

## 2016-04-20 NOTE — Progress Notes (Signed)
Pt discharging to SNF [facility].

## 2016-04-20 NOTE — Progress Notes (Signed)
Pt ready for transfer to [facility], pt stable--report given to [place], daughter notified by Education officer, museum. Questions addressed. SRP, RN

## 2016-04-20 NOTE — Discharge Summary (Addendum)
Physician Discharge Summary  Sabrina Page [id number] DOB: 1933/11/16 DOA: 04/15/2016  PCP: Sabrina Kehr, MD  Admit date: 04/15/2016 Discharge date: 04/20/2016  Admitted From: Home Disposition:  SNF  Recommendations for Outpatient Follow-up:  1. Follow up with PCP in 1-2 weeks 2. Follow up with Orthopedic Surgery as scheduled 3. Please encourage compliance of CPAP when sleeping 4. Recommend f/u with Neurology (Dr. Narda Page) at next available appt  Prescription for clonazepam for 1 tablet given because facility requires prescription  Discharge Condition:Stable CODE STATUS:Full Diet recommendation: Heart healthy   Brief/Interim Summary: 82 y.o.woman with a known history of orthostatic hypotension, HTN, HLD, COPD, asthma, and GERD who had an accidental fall this morning in her apartment. She speaks [place], and her daughter is at bedside to interpret (formal interpreter services were declined). The patient denies any preceding aura, headache, chest pain. She recalls slipping (she was attempting to ambulate in compression hose) while trying to sit down at the table. She denies loss of consciousness. She admits that she will get dizzy when she is orthostatic. Reportedly, her BP at home this morning went from 120/80 when sitting to 70/50 when standing  Right hip fracture, accidental fall reported but also known orthostasis -Continue analgesics as needed -Orthopedic surgery is following and patient underwent surgery on 11/25 -Remains stable at present. -Orthopedic surgery input reviewed. Patient is cleared for discharge from surgical standpoint -Patient to be discharged to SNF today  Shortness of breath -Denies shortness of breath at present -Continued on when necessary DuoNeb's -On minimal O2 support  OSA Patient to continue daily at bedtime CPAP -Chart reviewed. Patient noted to be removing her CPAP mask multiple times overnight -Patient has been  noncompliant with CPAP - please encourage patient to be adherent to CPAP  Orthostatic hypotension -Continue midodrine 10mg  TID -Recent orthostatic vital signs demonstrate orthostasis -Outpatient records reviewed. Patient is followed by Neurology (Dr. Narda Page) for orthostatic hypotension thought to be secondary to autonomic issues -Avoid aggressive hydration secondary to history of diastolic congestive heart failure  Acute delirium -Presently with baseline mentation -On further questioning, patient's mentation seems to worsen greatest at nighttime when she removes her CPAP. -Suspect hypercarbia/hypoxia as primary etiology of acute delirium -Have recommended patient to be more adherent to her CPAP  Prominent heart and mediastinum on chest x-ray -Noted on presenting chest x-ray findings. -Radiology recommendations for PA and lateral chest x-ray prior to discharge to rule out pulmonary venous congestion versus technical differences. -Reviewed PA and lateral chest x-ray. Findings of enlarged heart and vascular congestion. No overt edema (see below)  Grade 2 diastolic congestive heart failure, likely chronic -Most recent 2-D echocardiogram from August 2017 demonstrates EF of 55-60% with grade 2 diastolic dysfunction -PA and lateral chest x-ray during this admission demonstrates an enlarged heart and vascular congestion -Suspect diastolic congestive heart failure related to poorly treated obstructive sleep apnea -Per above, have advised patient to be more adherent to her CPAP  Discharge Diagnoses:  Principal Problem:   Closed right hip fracture ([place]) Active Problems:   Asthma, moderate persistent, well-controlled   OSA on CPAP   Morbid obesity ([place])   Orthostatic hypotension   Hip fracture ([place])   Fall    Discharge Instructions  Discharge Instructions    Weight bearing as tolerated    Complete by:  As directed        Medication List    STOP taking these medications    aspirin 81 MG tablet Replaced by:  aspirin 81 MG  EC tablet     TAKE these medications   aspirin 81 MG EC tablet Take 1 tablet (81 mg total) by mouth 2 (two) times daily. Replaces:  aspirin 81 MG tablet   Cholecalciferol 1000 units capsule Take 1,000 Units by mouth every morning.   clonazePAM 0.5 MG tablet Commonly known as:  KLONOPIN TAKE 1 TABLET BY MOUTH TWICE A DAY AS NEEDED FOR ANXIETY/INOMNIA What changed:  how much to take  how to take this  when to take this  reasons to take this  additional instructions   clonazePAM 0.5 MG tablet Commonly known as:  KLONOPIN Take 1 tablet (0.5 mg total) by mouth at bedtime as needed (sleep). What changed:  You were already taking a medication with the same name, and this prescription was added. Make sure you understand how and when to take each.   ferrous sulfate 325 (65 FE) MG tablet Take 1 tablet (325 mg total) by mouth 3 (three) times daily after meals.   Fish Oil 1000 MG Caps Take 1 capsule by mouth every morning.   FLUoxetine 40 MG capsule Commonly known as:  PROZAC Take 1 capsule (40 mg total) by mouth daily.   HYDROcodone-acetaminophen 5-325 MG tablet Commonly known as:  NORCO/VICODIN Take 1-2 tablets by mouth every 6 (six) hours as needed for moderate pain.   hydroxypropyl methylcellulose / hypromellose 2.5 % ophthalmic solution Commonly known as:  ISOPTO TEARS / GONIOVISC Place 1 drop into both eyes daily as needed for dry eyes.   KLOR-CON 8 MEQ tablet Generic drug:  potassium chloride Take 8 mEq by mouth daily.   loratadine 10 MG tablet Commonly known as:  CLARITIN Take 1 tablet (10 mg total) by mouth every morning.   midodrine 10 MG tablet Commonly known as:  PROAMATINE Take 1 tablet (10 mg total) by mouth 3 (three) times daily.   omeprazole 20 MG capsule Commonly known as:  PRILOSEC TAKE 2 CAPSULES BY MOUTH EVERY DAY What changed:  See the new instructions.   SUPER B COMPLEX PO Take 1 tablet by  mouth every morning.   traMADol 50 MG tablet Commonly known as:  ULTRAM Take 0.5-1 tablets (25-50 mg total) by mouth every 6 (six) hours as needed. What changed:  how much to take  when to take this      Follow-up Information    OLIN,MATTHEW D, MD Follow up in 2 week(s).   Specialty:  Orthopedic Surgery Why:  wound check Contact information: [address] [place] [address] [phone]          Allergies  Allergen Reactions  . Atorvastatin     REACTION: upset stomach  . Enalapril Maleate   . Hctz [Hydrochlorothiazide]     Dizzy   . Lovastatin     REACTION: tongue stress  . Simvastatin     REACTION: mouth sores  . Topamax [Topiramate]     syncope    Consultations:  Orthopedic Surgery  Procedures/Studies: Dg Chest 1 View  Result Date: 04/15/2016 CLINICAL DATA:  Pain after trauma EXAM: CHEST 1 VIEW COMPARISON:  January 18, 2016 FINDINGS: The heart size is more prominent the interval, probably due to the portable technique. The mediastinum is also more prominent. Increased interstitial prominence without overt edema. No focal infiltrate. No other acute abnormalities. IMPRESSION: The heart appears more prominent as does the mediastinum. This very well may be due to the low volume portable technique. Recommend a PA and lateral chest x-ray before discharge. Increased  interstitial prominence could represent pulmonary venous congestion versus technical differences as well. No overt edema. Electronically Signed   By: Dorise Bullion III M.D   On: 04/15/2016 17:16   Dg Chest 2 View  Result Date: 04/18/2016 CLINICAL DATA:  Status post hip surgery, previous abnormal chest x-ray history of asthma EXAM: CHEST  2 VIEW COMPARISON:  04/15/2016, 01/18/2016 FINDINGS: AP semi-erect and lateral views of the chest are obtained. There is mild cardiomegaly along with mild central vascular congestion, similar compared to 04/15/2016, new compared with 01/18/2016. No  consolidation or effusion. No pneumothorax. Degenerative changes of the spine. IMPRESSION: Mild cardiomegaly with mild central vascular congestion. No overt pulmonary edema or pleural effusion. Electronically Signed   By: Donavan Foil M.D.   On: 04/18/2016 20:04   Dg Pelvis 1-2 Views  Result Date: 04/15/2016 CLINICAL DATA:  Patient fell out of wheelchair. EXAM: PELVIS - 1-2 VIEW COMPARISON:  None. FINDINGS: There is a right hip fracture. IMPRESSION: There is a right hip fracture incompletely evaluated. Electronically Signed   By: Dorise Bullion III M.D   On: 04/15/2016 17:14   Pelvis Portable  Result Date: 04/17/2016 CLINICAL DATA:  Status post right hip replacement EXAM: PORTABLE PELVIS 1-2 VIEWS COMPARISON:  04/15/2016 FINDINGS: Right hip prosthesis is now seen in satisfactory position. No acute bony or soft tissue abnormality is noted. IMPRESSION: Status post right hip replacement. Electronically Signed   By: Inez Catalina M.D.   On: 04/17/2016 13:22   Dg Femur Min 2 Views Right  Result Date: 04/15/2016 CLINICAL DATA:  Pain after trauma EXAM: RIGHT FEMUR 2 VIEWS COMPARISON:  None FINDINGS: There is a subcapital right hip fracture with no dislocation. No other abnormalities. IMPRESSION: There is a subcapital right hip fracture with no dislocation. Electronically Signed   By: Dorise Bullion III M.D   On: 04/15/2016 17:15    Subjective: No complaints today  Discharge Exam: Vitals:   04/19/16 2133 04/20/16 0626  BP: (!) 124/49 140/65  Pulse: 77 79  Resp: 18 18  Temp: 99.9 F (37.7 C) 98.1 F (36.7 C)   Vitals:   04/20/16 0626 04/20/16 1000 04/20/16 1022 04/20/16 1200  BP: 140/65     Pulse: 79     Resp: 18     Temp: 98.1 F (36.7 C)     TempSrc: Oral     SpO2: 96% 99%    Weight:    100.7 kg (222 lb)  Height:   5\' 4"  (1.626 m)     General: Pt is alert, awake, not in acute distress Cardiovascular: RRR, S1/S2 +, no rubs, no gallops Respiratory: CTA bilaterally, no wheezing,  no rhonchi Abdominal: Soft, NT, ND, bowel sounds + Extremities: no edema, no cyanosis   The results of significant diagnostics from this hospitalization (including imaging, microbiology, ancillary and laboratory) are listed below for reference.     Microbiology: Recent Results (from the past 240 hour(s))  Surgical pcr screen     Status: None   Collection Time: 04/17/16  5:49 AM  Result Value Ref Range Status   MRSA, PCR NEGATIVE NEGATIVE Final   Staphylococcus aureus NEGATIVE NEGATIVE Final    Comment:        The Xpert SA Assay (FDA approved for NASAL specimens in patients over 25 years of age), is one component of a comprehensive surveillance program.  Test performance has been validated by [facility] for patients greater than or equal to 7 year old. It is not intended to diagnose  infection nor to guide or monitor treatment.      Labs: BNP (last 3 results) No results for input(s): BNP in the last 8760 hours. Basic Metabolic Panel:  Recent Labs Lab 04/15/16 1555 04/16/16 0338 04/18/16 0516 04/19/16 0459  NA 139 137 137 137  K 4.0 4.3 4.4 4.3  CL 106 104 101 102  CO2 28 28 30 31   GLUCOSE 108* 127* 127* 92  BUN 15 16 14 17   CREATININE 0.95 0.93 0.79 0.94  CALCIUM 8.4* 8.5* 8.6* 8.5*   Liver Function Tests:  Recent Labs Lab 04/16/16 0338  AST 26  ALT 22  ALKPHOS 48  BILITOT 0.9  PROT 6.3*  ALBUMIN 3.7   No results for input(s): LIPASE, AMYLASE in the last 168 hours. No results for input(s): AMMONIA in the last 168 hours. CBC:  Recent Labs Lab 04/15/16 1555 04/18/16 0516 04/19/16 0459 04/20/16 0508  WBC 6.6 7.9 7.1 6.5  NEUTROABS 4.6  --   --   --   HGB 14.2 11.3* 10.8* 10.9*  HCT 44.6 34.8* 34.0* 33.9*  MCV 91.8 90.6 91.9 91.9  PLT 151 149* 175 177   Cardiac Enzymes: No results for input(s): CKTOTAL, CKMB, CKMBINDEX, TROPONINI in the last 168 hours. BNP: Invalid input(s): POCBNP CBG:  Recent Labs Lab 04/19/16 0759 04/19/16 1759   GLUCAP 90 120*   D-Dimer No results for input(s): DDIMER in the last 72 hours. Hgb A1c No results for input(s): HGBA1C in the last 72 hours. Lipid Profile No results for input(s): CHOL, HDL, LDLCALC, TRIG, CHOLHDL, LDLDIRECT in the last 72 hours. Thyroid function studies No results for input(s): TSH, T4TOTAL, T3FREE, THYROIDAB in the last 72 hours.  Invalid input(s): FREET3 Anemia work up No results for input(s): VITAMINB12, FOLATE, FERRITIN, TIBC, IRON, RETICCTPCT in the last 72 hours. Urinalysis    Component Value Date/Time   COLORURINE YELLOW 04/19/2016 1421   APPEARANCEUR CLEAR 04/19/2016 1421   LABSPEC 1.013 04/19/2016 1421   PHURINE 7.5 04/19/2016 1421   GLUCOSEU NEGATIVE 04/19/2016 1421   GLUCOSEU NEGATIVE 06/26/2015 0930   HGBUR NEGATIVE 04/19/2016 1421   BILIRUBINUR NEGATIVE 04/19/2016 1421   KETONESUR NEGATIVE 04/19/2016 1421   PROTEINUR NEGATIVE 04/19/2016 1421   UROBILINOGEN 0.2 06/26/2015 0930   NITRITE NEGATIVE 04/19/2016 1421   LEUKOCYTESUR NEGATIVE 04/19/2016 1421   Sepsis Labs Invalid input(s): PROCALCITONIN,  WBC,  LACTICIDVEN Microbiology Recent Results (from the past 240 hour(s))  Surgical pcr screen     Status: None   Collection Time: 04/17/16  5:49 AM  Result Value Ref Range Status   MRSA, PCR NEGATIVE NEGATIVE Final   Staphylococcus aureus NEGATIVE NEGATIVE Final    Comment:        The Xpert SA Assay (FDA approved for NASAL specimens in patients over 48 years of age), is one component of a comprehensive surveillance program.  Test performance has been validated by [facility] for patients greater than or equal to 79 year old. It is not intended to diagnose infection nor to guide or monitor treatment.      SIGNED:   Donne Hazel, MD  [facility] 04/20/2016, 2:40 PM  If 7PM-7AM, please contact night-coverage [email or web address] Password [email or web address]

## 2016-04-20 NOTE — Clinical Social Work Note (Signed)
Patient does NOT have a bed offer from [facility] [place] [facility]. Facility representative will come evaluate patient at bedside before extending offer.   Glendon Axe, MSW [phone] 04/20/2016 9:16 AM

## 2016-04-20 NOTE — Progress Notes (Signed)
Patient ID: Sabrina Page, female   DOB: 06/04/1933, 82 y.o.   MRN: [id number] Subjective: 3 Days Post-Op Procedure(s) (LRB): ARTHROPLASTY BIPOLAR HIP (HEMIARTHROPLASTY) (Right)    Patient reports pain as mild. 2/10. Still with night time confusion though as reported by her daughter Minimal activity with PT still with need of of assistance, some light headedness when up  Objective:   VITALS:   Vitals:   04/19/16 2133 04/20/16 0626  BP: (!) 124/49 140/65  Pulse: 77 79  Resp: 18 18  Temp: 99.9 F (37.7 C) 98.1 F (36.7 C)    Neurovascular intact Incision: dressing C/D/I  LABS  Recent Labs  04/18/16 0516 04/19/16 0459 04/20/16 0508  HGB 11.3* 10.8* 10.9*  HCT 34.8* 34.0* 33.9*  WBC 7.9 7.1 6.5  PLT 149* 175 177     Recent Labs  04/18/16 0516 04/19/16 0459  NA 137 137  K 4.4 4.3  BUN 14 17  CREATININE 0.79 0.94  GLUCOSE 127* 92    No results for input(s): LABPT, INR in the last 72 hours.   Assessment/Plan: 3 Days Post-Op Procedure(s) (LRB): ARTHROPLASTY BIPOLAR HIP (HEMIARTHROPLASTY) (Right)   Advance diet Up with therapy Plan for discharge tomorrow ot Thursday most likely for [facility] SNF stay at [facility] do ambulation needs

## 2016-04-20 NOTE — Clinical Social Work Placement (Signed)
Patient is set to discharge to [facility] today. Patient & daughter, [name] made aware aware. Discharge packet given to RN, Sophia. [facility] called for transport.     Raynaldo Opitz, [facility] Clinical Social Worker cell #: [phone]    CLINICAL SOCIAL WORK PLACEMENT  NOTE  Date:  04/20/2016  Patient Details  Name: Sabrina Page MRN: [id number] Date of Birth: 07/21/1933  Clinical Social Work is seeking post-discharge placement for this patient at the [place] level of care (*CSW will initial, date and re-position this form in  chart as items are completed):  Yes   Patient/family provided with [place] Work Department's list of facilities offering this level of care within the geographic area requested by the patient (or if unable, by the patient's family).  Yes   Patient/family informed of their freedom to choose among providers that offer the needed level of care, that participate in Medicare, Medicaid or managed care program needed by the patient, have an available bed and are willing to accept the patient.      Patient/family informed of [place]'s ownership interest in [facility] and [facility], as well as of the fact that they are under no obligation to receive care at these facilities.  PASRR submitted to EDS on 04/19/16     PASRR number received on 04/19/16     Existing PASRR number confirmed on       FL2 transmitted to all facilities in geographic area requested by pt/family on 04/19/16     FL2 transmitted to all facilities within larger geographic area on       Patient informed that his/her managed care company has contracts with or will negotiate with certain facilities, including the following:        Yes   Patient/family informed of bed offers received.  Patient chooses bed at [facility] recommends and patient chooses bed at      Patient to  be transferred to [facility] [place] on 04/20/16.  Patient to be transferred to facility by [facility]     Patient family notified on 04/20/16 of transfer.  Name of family member notified:  patient's daughter, [name] via phone     PHYSICIAN       Additional Comment:    _______________________________________________ Standley Brooking, LCSW 04/20/2016, 3:09 PM

## 2016-04-21 DIAGNOSIS — Z9181 History of falling: Secondary | ICD-10-CM | POA: Diagnosis not present

## 2016-04-21 DIAGNOSIS — J449 Chronic obstructive pulmonary disease, unspecified: Secondary | ICD-10-CM | POA: Diagnosis not present

## 2016-04-21 DIAGNOSIS — G4730 Sleep apnea, unspecified: Secondary | ICD-10-CM | POA: Diagnosis not present

## 2016-04-21 DIAGNOSIS — R262 Difficulty in walking, not elsewhere classified: Secondary | ICD-10-CM | POA: Diagnosis not present

## 2016-04-21 DIAGNOSIS — S7291XD Unspecified fracture of right femur, subsequent encounter for closed fracture with routine healing: Secondary | ICD-10-CM | POA: Diagnosis not present

## 2016-04-28 DIAGNOSIS — Z96641 Presence of right artificial hip joint: Secondary | ICD-10-CM | POA: Diagnosis not present

## 2016-04-28 DIAGNOSIS — Z96649 Presence of unspecified artificial hip joint: Secondary | ICD-10-CM | POA: Diagnosis not present

## 2016-04-28 DIAGNOSIS — Z471 Aftercare following joint replacement surgery: Secondary | ICD-10-CM | POA: Diagnosis not present

## 2016-04-29 DIAGNOSIS — E785 Hyperlipidemia, unspecified: Secondary | ICD-10-CM | POA: Diagnosis not present

## 2016-04-29 DIAGNOSIS — I951 Orthostatic hypotension: Secondary | ICD-10-CM | POA: Diagnosis not present

## 2016-04-29 DIAGNOSIS — G4733 Obstructive sleep apnea (adult) (pediatric): Secondary | ICD-10-CM | POA: Diagnosis not present

## 2016-04-29 DIAGNOSIS — Z9181 History of falling: Secondary | ICD-10-CM | POA: Diagnosis not present

## 2016-04-29 DIAGNOSIS — F329 Major depressive disorder, single episode, unspecified: Secondary | ICD-10-CM | POA: Diagnosis not present

## 2016-04-29 DIAGNOSIS — J449 Chronic obstructive pulmonary disease, unspecified: Secondary | ICD-10-CM | POA: Diagnosis not present

## 2016-04-29 DIAGNOSIS — W19XXXD Unspecified fall, subsequent encounter: Secondary | ICD-10-CM | POA: Diagnosis not present

## 2016-04-29 DIAGNOSIS — F419 Anxiety disorder, unspecified: Secondary | ICD-10-CM | POA: Diagnosis not present

## 2016-04-29 DIAGNOSIS — M80851D Other osteoporosis with current pathological fracture, right femur, subsequent encounter for fracture with routine healing: Secondary | ICD-10-CM | POA: Diagnosis not present

## 2016-04-29 DIAGNOSIS — I739 Peripheral vascular disease, unspecified: Secondary | ICD-10-CM | POA: Diagnosis not present

## 2016-04-29 DIAGNOSIS — Z87891 Personal history of nicotine dependence: Secondary | ICD-10-CM | POA: Diagnosis not present

## 2016-04-29 DIAGNOSIS — Z7982 Long term (current) use of aspirin: Secondary | ICD-10-CM | POA: Diagnosis not present

## 2016-04-29 DIAGNOSIS — Z6837 Body mass index (BMI) 37.0-37.9, adult: Secondary | ICD-10-CM | POA: Diagnosis not present

## 2016-05-03 DIAGNOSIS — I951 Orthostatic hypotension: Secondary | ICD-10-CM | POA: Diagnosis not present

## 2016-05-03 DIAGNOSIS — I739 Peripheral vascular disease, unspecified: Secondary | ICD-10-CM | POA: Diagnosis not present

## 2016-05-03 DIAGNOSIS — J449 Chronic obstructive pulmonary disease, unspecified: Secondary | ICD-10-CM | POA: Diagnosis not present

## 2016-05-03 DIAGNOSIS — M80851D Other osteoporosis with current pathological fracture, right femur, subsequent encounter for fracture with routine healing: Secondary | ICD-10-CM | POA: Diagnosis not present

## 2016-05-03 DIAGNOSIS — Z6837 Body mass index (BMI) 37.0-37.9, adult: Secondary | ICD-10-CM | POA: Diagnosis not present

## 2016-05-07 DIAGNOSIS — I739 Peripheral vascular disease, unspecified: Secondary | ICD-10-CM | POA: Diagnosis not present

## 2016-05-07 DIAGNOSIS — I951 Orthostatic hypotension: Secondary | ICD-10-CM | POA: Diagnosis not present

## 2016-05-07 DIAGNOSIS — Z6837 Body mass index (BMI) 37.0-37.9, adult: Secondary | ICD-10-CM | POA: Diagnosis not present

## 2016-05-07 DIAGNOSIS — J449 Chronic obstructive pulmonary disease, unspecified: Secondary | ICD-10-CM | POA: Diagnosis not present

## 2016-05-07 DIAGNOSIS — M80851D Other osteoporosis with current pathological fracture, right femur, subsequent encounter for fracture with routine healing: Secondary | ICD-10-CM | POA: Diagnosis not present

## 2016-05-10 DIAGNOSIS — Z6837 Body mass index (BMI) 37.0-37.9, adult: Secondary | ICD-10-CM | POA: Diagnosis not present

## 2016-05-10 DIAGNOSIS — M80851D Other osteoporosis with current pathological fracture, right femur, subsequent encounter for fracture with routine healing: Secondary | ICD-10-CM | POA: Diagnosis not present

## 2016-05-10 DIAGNOSIS — I739 Peripheral vascular disease, unspecified: Secondary | ICD-10-CM | POA: Diagnosis not present

## 2016-05-10 DIAGNOSIS — I951 Orthostatic hypotension: Secondary | ICD-10-CM | POA: Diagnosis not present

## 2016-05-10 DIAGNOSIS — J449 Chronic obstructive pulmonary disease, unspecified: Secondary | ICD-10-CM | POA: Diagnosis not present

## 2016-05-13 DIAGNOSIS — M80851D Other osteoporosis with current pathological fracture, right femur, subsequent encounter for fracture with routine healing: Secondary | ICD-10-CM | POA: Diagnosis not present

## 2016-05-13 DIAGNOSIS — I951 Orthostatic hypotension: Secondary | ICD-10-CM | POA: Diagnosis not present

## 2016-05-13 DIAGNOSIS — Z6837 Body mass index (BMI) 37.0-37.9, adult: Secondary | ICD-10-CM | POA: Diagnosis not present

## 2016-05-13 DIAGNOSIS — I739 Peripheral vascular disease, unspecified: Secondary | ICD-10-CM | POA: Diagnosis not present

## 2016-05-13 DIAGNOSIS — J449 Chronic obstructive pulmonary disease, unspecified: Secondary | ICD-10-CM | POA: Diagnosis not present

## 2016-05-28 DIAGNOSIS — Z96649 Presence of unspecified artificial hip joint: Secondary | ICD-10-CM | POA: Diagnosis not present

## 2016-05-28 DIAGNOSIS — M25561 Pain in right knee: Secondary | ICD-10-CM | POA: Diagnosis not present

## 2016-05-28 DIAGNOSIS — Z471 Aftercare following joint replacement surgery: Secondary | ICD-10-CM | POA: Diagnosis not present

## 2016-05-28 DIAGNOSIS — Z96641 Presence of right artificial hip joint: Secondary | ICD-10-CM | POA: Diagnosis not present

## 2016-06-03 ENCOUNTER — Ambulatory Visit (INDEPENDENT_AMBULATORY_CARE_PROVIDER_SITE_OTHER): Payer: Medicare Other | Admitting: Internal Medicine

## 2016-06-03 ENCOUNTER — Encounter: Payer: Self-pay | Admitting: Internal Medicine

## 2016-06-03 DIAGNOSIS — F411 Generalized anxiety disorder: Secondary | ICD-10-CM | POA: Diagnosis not present

## 2016-06-03 DIAGNOSIS — J209 Acute bronchitis, unspecified: Secondary | ICD-10-CM

## 2016-06-03 DIAGNOSIS — I951 Orthostatic hypotension: Secondary | ICD-10-CM | POA: Diagnosis not present

## 2016-06-03 DIAGNOSIS — J4540 Moderate persistent asthma, uncomplicated: Secondary | ICD-10-CM

## 2016-06-03 MED ORDER — MOMETASONE FURO-FORMOTEROL FUM 200-5 MCG/ACT IN AERO: 2.0000 | INHALATION_SPRAY | Freq: Two times a day (BID) | RESPIRATORY_TRACT | 5 refills | Status: DC

## 2016-06-03 MED ORDER — PROMETHAZINE-CODEINE 6.25-10 MG/5ML PO SYRP: 5.0000 mL | ORAL_SOLUTION | ORAL | 0 refills | Status: DC | PRN

## 2016-06-03 MED ORDER — CEFDINIR 300 MG PO CAPS: 300.0000 mg | ORAL_CAPSULE | Freq: Two times a day (BID) | ORAL | 0 refills | Status: DC

## 2016-06-03 NOTE — Assessment & Plan Note (Signed)
Midodrine  

## 2016-06-03 NOTE — Progress Notes (Signed)
Subjective:  Patient ID: Sabrina Page, female    DOB: [birth year]  Age: 83 y.o. MRN: [id number]  CC: No chief complaint on file.   HPI Janazia Sorto presents for  LBP, asthma, hip fx. F/u low BP. C/o URI sx's x 1 week  Outpatient Medications Prior to Visit  Medication Sig Dispense Refill  . aspirin EC 81 MG EC tablet Take 1 tablet (81 mg total) by mouth 2 (two) times daily. 60 tablet 0  . B Complex-C (SUPER B COMPLEX PO) Take 1 tablet by mouth every morning.     . Cholecalciferol 1000 UNITS capsule Take 1,000 Units by mouth every morning.     . clonazePAM (KLONOPIN) 0.5 MG tablet TAKE 1 TABLET BY MOUTH TWICE A DAY AS NEEDED FOR ANXIETY/INOMNIA (Patient taking differently: Take 0.5 mg by mouth at bedtime as needed for anxiety (sleep). ) 60 tablet 2  . clonazePAM (KLONOPIN) 0.5 MG tablet Take 1 tablet (0.5 mg total) by mouth at bedtime as needed (sleep). 1 tablet 0  . FLUoxetine (PROZAC) 40 MG capsule Take 1 capsule (40 mg total) by mouth daily. 90 capsule 3  . HYDROcodone-acetaminophen (NORCO/VICODIN) 5-325 MG tablet Take 1-2 tablets by mouth every 6 (six) hours as needed for moderate pain. 30 tablet 0  . KLOR-CON 8 MEQ tablet Take 8 mEq by mouth daily.    Marland Kitchen loratadine (CLARITIN) 10 MG tablet Take 1 tablet (10 mg total) by mouth every morning. 100 tablet 3  . midodrine (PROAMATINE) 10 MG tablet Take 1 tablet (10 mg total) by mouth 3 (three) times daily. 90 tablet 5  . Omega-3 Fatty Acids (FISH OIL) 1000 MG CAPS Take 1 capsule by mouth every morning.     Marland Kitchen omeprazole (PRILOSEC) 20 MG capsule TAKE 2 CAPSULES BY MOUTH EVERY DAY (Patient taking differently: TAKE 40 MG BY MOUTH DAILY) 180 capsule 3  . traMADol (ULTRAM) 50 MG tablet Take 0.5-1 tablets (25-50 mg total) by mouth every 6 (six) hours as needed. (Patient taking differently: Take 50 mg by mouth 2 (two) times daily. ) 120 tablet 3  . ferrous sulfate 325 (65 FE) MG tablet Take 1 tablet (325 mg total) by mouth 3 (three)  times daily after meals. (Patient not taking: Reported on 06/03/2016) 30 tablet 0  . hydroxypropyl methylcellulose / hypromellose (ISOPTO TEARS / GONIOVISC) 2.5 % ophthalmic solution Place 1 drop into both eyes daily as needed for dry eyes.     No facility-administered medications prior to visit.     ROS Review of Systems  Constitutional: Positive for fatigue. Negative for activity change, appetite change, chills and unexpected weight change.  HENT: Positive for sinus pain and sore throat. Negative for congestion, mouth sores and sinus pressure.   Eyes: Negative for visual disturbance.  Respiratory: Positive for cough, shortness of breath and wheezing. Negative for chest tightness.   Gastrointestinal: Negative for abdominal pain and nausea.  Genitourinary: Negative for difficulty urinating, frequency and vaginal pain.  Musculoskeletal: Positive for arthralgias and back pain. Negative for gait problem.  Skin: Negative for pallor and rash.  Neurological: Positive for dizziness, syncope, weakness and light-headedness. Negative for tremors, numbness and headaches.  Psychiatric/Behavioral: Positive for dysphoric mood. Negative for confusion, sleep disturbance and suicidal ideas.  eryth throat  Objective:  BP 100/60   Pulse 92   Temp 98.4 F (36.9 C) (Oral)   Wt 210 lb (95.3 kg)   SpO2 96%   BMI 36.05 kg/m   BP Readings from Last 3 Encounters:  06/03/16 100/60  04/20/16 140/65  04/02/16 110/68    Wt Readings from Last 3 Encounters:  06/03/16 210 lb (95.3 kg)  04/20/16 222 lb (100.7 kg)  04/02/16 212 lb (96.2 kg)    Physical Exam  Constitutional: She appears well-developed. No distress.  HENT:  Head: Normocephalic.  Right Ear: External ear normal.  Left Ear: External ear normal.  Nose: Nose normal.  Mouth/Throat: Oropharynx is clear and moist.  Eyes: Conjunctivae are normal. Pupils are equal, round, and reactive to light. Right eye exhibits no discharge. Left eye exhibits no  discharge.  Neck: Normal range of motion. Neck supple. No JVD present. No tracheal deviation present. No thyromegaly present.  Cardiovascular: Normal rate, regular rhythm and normal heart sounds.   Pulmonary/Chest: No stridor. No respiratory distress. She has no wheezes.  Abdominal: Soft. Bowel sounds are normal. She exhibits no distension and no mass. There is no tenderness. There is no rebound and no guarding.  Musculoskeletal: She exhibits tenderness. She exhibits no edema.  Lymphadenopathy:    She has no cervical adenopathy.  Neurological: She displays normal reflexes. No cranial nerve deficit. She exhibits normal muscle tone. Coordination abnormal.  Skin: No rash noted. No erythema.  Psychiatric: She has a normal mood and affect. Her behavior is normal. Judgment and thought content normal.  walker eryth throat  Lab Results  Component Value Date   WBC 6.5 04/20/2016   HGB 10.9 (L) 04/20/2016   HCT 33.9 (L) 04/20/2016   PLT 177 04/20/2016   GLUCOSE 92 04/19/2016   CHOL 270 (H) 06/12/2014   TRIG 132.0 06/12/2014   HDL 52.40 06/12/2014   LDLDIRECT 159.1 05/16/2012   LDLCALC 191 (H) 06/12/2014   ALT 22 04/16/2016   AST 26 04/16/2016   NA 137 04/19/2016   K 4.3 04/19/2016   CL 102 04/19/2016   CREATININE 0.94 04/19/2016   BUN 17 04/19/2016   CO2 31 04/19/2016   TSH 2.703 01/19/2016   INR 1.07 04/16/2016   HGBA1C 5.6 12/25/2015    Dg Chest 1 View  Result Date: 04/15/2016 CLINICAL DATA:  Pain after trauma EXAM: CHEST 1 VIEW COMPARISON:  January 18, 2016 FINDINGS: The heart size is more prominent the interval, probably due to the portable technique. The mediastinum is also more prominent. Increased interstitial prominence without overt edema. No focal infiltrate. No other acute abnormalities. IMPRESSION: The heart appears more prominent as does the mediastinum. This very well may be due to the low volume portable technique. Recommend a PA and lateral chest x-ray before discharge.  Increased interstitial prominence could represent pulmonary venous congestion versus technical differences as well. No overt edema. Electronically Signed   By: Dorise Bullion III M.D   On: 04/15/2016 17:16   Dg Pelvis 1-2 Views  Result Date: 04/15/2016 CLINICAL DATA:  Patient fell out of wheelchair. EXAM: PELVIS - 1-2 VIEW COMPARISON:  None. FINDINGS: There is a right hip fracture. IMPRESSION: There is a right hip fracture incompletely evaluated. Electronically Signed   By: Dorise Bullion III M.D   On: 04/15/2016 17:14   Dg Femur Min 2 Views Right  Result Date: 04/15/2016 CLINICAL DATA:  Pain after trauma EXAM: RIGHT FEMUR 2 VIEWS COMPARISON:  None FINDINGS: There is a subcapital right hip fracture with no dislocation. No other abnormalities. IMPRESSION: There is a subcapital right hip fracture with no dislocation. Electronically Signed   By: Dorise Bullion III M.D   On: 04/15/2016 17:15    Assessment & Plan:   There  are no diagnoses linked to this encounter. I am having Ms. Hoctor maintain her Fish Oil, Cholecalciferol, B Complex-C (SUPER B COMPLEX PO), loratadine, traMADol, clonazePAM, KLOR-CON, hydroxypropyl methylcellulose / hypromellose, midodrine, omeprazole, FLUoxetine, aspirin, HYDROcodone-acetaminophen, clonazePAM, and ferrous sulfate.  No orders of the defined types were placed in this encounter.    Follow-up: No Follow-up on file.  [name], MD

## 2016-06-03 NOTE — Assessment & Plan Note (Signed)
Cefdinir

## 2016-06-03 NOTE — Progress Notes (Signed)
Pre visit review using our clinic review tool, if applicable. No additional management support is needed unless otherwise documented below in the visit note. 

## 2016-06-03 NOTE — Assessment & Plan Note (Signed)
Start [employer]

## 2016-06-03 NOTE — Assessment & Plan Note (Signed)
Fluoxetine RTC 4 wks

## 2016-06-08 ENCOUNTER — Ambulatory Visit: Payer: Medicare Other | Admitting: Internal Medicine

## 2016-06-20 ENCOUNTER — Other Ambulatory Visit: Payer: Self-pay | Admitting: Internal Medicine

## 2016-06-21 NOTE — Telephone Encounter (Signed)
Routing to dr plotnikov, please advise, thanks 

## 2016-06-23 NOTE — Telephone Encounter (Signed)
Called rx to pharm  

## 2016-06-26 ENCOUNTER — Other Ambulatory Visit: Payer: Self-pay | Admitting: Internal Medicine

## 2016-06-29 ENCOUNTER — Encounter: Payer: Self-pay | Admitting: Internal Medicine

## 2016-06-29 ENCOUNTER — Ambulatory Visit (INDEPENDENT_AMBULATORY_CARE_PROVIDER_SITE_OTHER): Payer: Medicare Other | Admitting: Internal Medicine

## 2016-06-29 VITALS — BP 112/74 | HR 100 | Temp 98.4°F | Resp 20 | Wt 211.5 lb

## 2016-06-29 DIAGNOSIS — I951 Orthostatic hypotension: Secondary | ICD-10-CM | POA: Diagnosis not present

## 2016-06-29 DIAGNOSIS — J4540 Moderate persistent asthma, uncomplicated: Secondary | ICD-10-CM

## 2016-06-29 DIAGNOSIS — W19XXXD Unspecified fall, subsequent encounter: Secondary | ICD-10-CM | POA: Diagnosis not present

## 2016-06-29 DIAGNOSIS — Y92099 Unspecified place in other non-institutional residence as the place of occurrence of the external cause: Secondary | ICD-10-CM

## 2016-06-29 DIAGNOSIS — R296 Repeated falls: Secondary | ICD-10-CM

## 2016-06-29 DIAGNOSIS — I10 Essential (primary) hypertension: Secondary | ICD-10-CM | POA: Diagnosis not present

## 2016-06-29 DIAGNOSIS — Y92009 Unspecified place in unspecified non-institutional (private) residence as the place of occurrence of the external cause: Secondary | ICD-10-CM

## 2016-06-29 MED ORDER — TRAMADOL HCL 50 MG PO TABS: ORAL_TABLET | ORAL | 2 refills | Status: DC

## 2016-06-29 MED ORDER — MOMETASONE FURO-FORMOTEROL FUM 200-5 MCG/ACT IN AERO: 2.0000 | INHALATION_SPRAY | Freq: Two times a day (BID) | RESPIRATORY_TRACT | 5 refills | Status: DC

## 2016-06-29 MED ORDER — CLONAZEPAM 0.5 MG PO TABS: 0.5000 mg | ORAL_TABLET | Freq: Every evening | ORAL | 0 refills | Status: DC | PRN

## 2016-06-29 MED ORDER — LORATADINE 10 MG PO TABS: 10.0000 mg | ORAL_TABLET | Freq: Every morning | ORAL | 3 refills | Status: DC

## 2016-06-29 MED ORDER — OMEPRAZOLE 20 MG PO CPDR: 40.0000 mg | DELAYED_RELEASE_CAPSULE | Freq: Every day | ORAL | 3 refills | Status: DC

## 2016-06-29 MED ORDER — FLUOXETINE HCL 40 MG PO CAPS: 40.0000 mg | ORAL_CAPSULE | Freq: Every day | ORAL | 3 refills | Status: DC

## 2016-06-29 MED ORDER — ALBUTEROL SULFATE (2.5 MG/3ML) 0.083% IN NEBU: 2.5000 mg | INHALATION_SOLUTION | Freq: Four times a day (QID) | RESPIRATORY_TRACT | 1 refills | Status: DC | PRN

## 2016-06-29 NOTE — Progress Notes (Signed)
Pre visit review using our clinic review tool, if applicable. No additional management support is needed unless otherwise documented below in the visit note. 

## 2016-06-29 NOTE — Assessment & Plan Note (Addendum)
Breo to restart HHN albuterol

## 2016-06-29 NOTE — Assessment & Plan Note (Signed)
No meds

## 2016-06-29 NOTE — Assessment & Plan Note (Signed)
Midodrin tid Increase salt intake a little w/caution

## 2016-06-29 NOTE — Assessment & Plan Note (Signed)
PT

## 2016-06-29 NOTE — Assessment & Plan Note (Signed)
Walker

## 2016-06-29 NOTE — Progress Notes (Signed)
Subjective:  Patient ID: Sabrina Page, female    DOB: 1933/10/16  Age: 83 y.o. MRN: [id number]  CC: No chief complaint on file.   HPI Sabrina Page presents for syncope - had 4 more falls. F/u asthma, anxiety f/u  Outpatient Medications Prior to Visit  Medication Sig Dispense Refill  . aspirin EC 81 MG EC tablet Take 1 tablet (81 mg total) by mouth 2 (two) times daily. 60 tablet 0  . B Complex-C (SUPER B COMPLEX PO) Take 1 tablet by mouth every morning.     . Cholecalciferol 1000 UNITS capsule Take 1,000 Units by mouth every morning.     . clonazePAM (KLONOPIN) 0.5 MG tablet TAKE 1 TABLET BY MOUTH TWICE A DAY AS NEEDED FOR ANXIETY/INOMNIA (Patient taking differently: Take 0.5 mg by mouth at bedtime as needed for anxiety (sleep). ) 60 tablet 2  . clonazePAM (KLONOPIN) 0.5 MG tablet Take 1 tablet (0.5 mg total) by mouth at bedtime as needed (sleep). 1 tablet 0  . FLUoxetine (PROZAC) 40 MG capsule Take 1 capsule (40 mg total) by mouth daily. 90 capsule 3  . KLOR-CON 8 MEQ tablet Take 8 mEq by mouth daily.    Marland Kitchen loratadine (CLARITIN) 10 MG tablet TAKE 1 TABLET BY MOUTH EVERY MORNING 100 tablet 1  . midodrine (PROAMATINE) 10 MG tablet Take 1 tablet (10 mg total) by mouth 3 (three) times daily. 90 tablet 5  . mometasone-formoterol (DULERA) 200-5 MCG/ACT AERO Inhale 2 puffs into the lungs 2 (two) times daily. 1 Inhaler 5  . Omega-3 Fatty Acids (FISH OIL) 1000 MG CAPS Take 1 capsule by mouth every morning.     Marland Kitchen omeprazole (PRILOSEC) 20 MG capsule TAKE 2 CAPSULES BY MOUTH EVERY DAY (Patient taking differently: TAKE 40 MG BY MOUTH DAILY) 180 capsule 3  . traMADol (ULTRAM) 50 MG tablet TAKE 1/2 TO 1 TABLET BY MOUTH EVERY 6 HOURS AS NEEDED 120 tablet 2  . cefdinir (OMNICEF) 300 MG capsule Take 1 capsule (300 mg total) by mouth 2 (two) times daily. (Patient not taking: Reported on 06/29/2016) 14 capsule 0  . ferrous sulfate 325 (65 FE) MG tablet Take 1 tablet (325 mg total) by mouth 3  (three) times daily after meals. (Patient not taking: Reported on 06/03/2016) 30 tablet 0  . hydroxypropyl methylcellulose / hypromellose (ISOPTO TEARS / GONIOVISC) 2.5 % ophthalmic solution Place 1 drop into both eyes daily as needed for dry eyes.    . promethazine-codeine (PHENERGAN WITH CODEINE) 6.25-10 MG/5ML syrup Take 5 mLs by mouth every 4 (four) hours as needed. (Patient not taking: Reported on 06/29/2016) 300 mL 0   No facility-administered medications prior to visit.     ROS Review of Systems  Constitutional: Negative for activity change, appetite change, chills, diaphoresis, fatigue, fever and unexpected weight change.  HENT: Negative for congestion, ear pain, facial swelling, hearing loss, mouth sores, nosebleeds, postnasal drip, rhinorrhea, sinus pressure, sneezing, sore throat, tinnitus and trouble swallowing.   Eyes: Negative for pain, discharge, redness, itching and visual disturbance.  Respiratory: Positive for wheezing. Negative for cough, chest tightness, shortness of breath and stridor.   Cardiovascular: Negative for chest pain, palpitations and leg swelling.  Gastrointestinal: Negative for abdominal distention, anal bleeding, blood in stool, constipation, diarrhea, nausea and rectal pain.  Genitourinary: Negative for difficulty urinating, dysuria, flank pain, frequency, genital sores, hematuria, pelvic pain, urgency, vaginal bleeding and vaginal discharge.  Musculoskeletal: Positive for arthralgias and gait problem. Negative for back pain, joint swelling, neck pain  and neck stiffness.  Skin: Negative.  Negative for rash.  Neurological: Positive for dizziness, syncope and weakness. Negative for tremors, seizures, speech difficulty, numbness and headaches.  Hematological: Negative for adenopathy. Does not bruise/bleed easily.  Psychiatric/Behavioral: Negative for behavioral problems, decreased concentration, dysphoric mood, sleep disturbance and suicidal ideas. The patient is  nervous/anxious.     Objective:  BP 112/74 (BP Location: Right Arm, Cuff Size: Small)   Pulse 100   Temp 98.4 F (36.9 C) (Oral)   Resp 20   Wt 211 lb 8 oz (95.9 kg)   SpO2 97%   BMI 36.30 kg/m   BP Readings from Last 3 Encounters:  06/29/16 112/74  06/03/16 100/60  04/20/16 140/65    Wt Readings from Last 3 Encounters:  06/29/16 211 lb 8 oz (95.9 kg)  06/03/16 210 lb (95.3 kg)  04/20/16 222 lb (100.7 kg)    Physical Exam  Constitutional: She appears well-developed. No distress.  HENT:  Head: Normocephalic.  Right Ear: External ear normal.  Left Ear: External ear normal.  Nose: Nose normal.  Mouth/Throat: Oropharynx is clear and moist.  Eyes: Conjunctivae are normal. Pupils are equal, round, and reactive to light. Right eye exhibits no discharge. Left eye exhibits no discharge.  Neck: Normal range of motion. Neck supple. No JVD present. No tracheal deviation present. No thyromegaly present.  Cardiovascular: Normal rate, regular rhythm and normal heart sounds.   Pulmonary/Chest: No stridor. No respiratory distress. She has no wheezes.  Abdominal: Soft. Bowel sounds are normal. She exhibits no distension and no mass. There is no tenderness. There is no rebound and no guarding.  Musculoskeletal: She exhibits no edema or tenderness.  Lymphadenopathy:    She has no cervical adenopathy.  Neurological: She displays normal reflexes. No cranial nerve deficit. She exhibits normal muscle tone. Coordination abnormal.  Skin: No rash noted. No erythema.  Psychiatric: She has a normal mood and affect. Her behavior is normal. Judgment and thought content normal.  Obese Walker  Lab Results  Component Value Date   WBC 6.5 04/20/2016   HGB 10.9 (L) 04/20/2016   HCT 33.9 (L) 04/20/2016   PLT 177 04/20/2016   GLUCOSE 92 04/19/2016   CHOL 270 (H) 06/12/2014   TRIG 132.0 06/12/2014   HDL 52.40 06/12/2014   LDLDIRECT 159.1 05/16/2012   LDLCALC 191 (H) 06/12/2014   ALT 22  04/16/2016   AST 26 04/16/2016   NA 137 04/19/2016   K 4.3 04/19/2016   CL 102 04/19/2016   CREATININE 0.94 04/19/2016   BUN 17 04/19/2016   CO2 31 04/19/2016   TSH 2.703 01/19/2016   INR 1.07 04/16/2016   HGBA1C 5.6 12/25/2015    Dg Chest 1 View  Result Date: 04/15/2016 CLINICAL DATA:  Pain after trauma EXAM: CHEST 1 VIEW COMPARISON:  January 18, 2016 FINDINGS: The heart size is more prominent the interval, probably due to the portable technique. The mediastinum is also more prominent. Increased interstitial prominence without overt edema. No focal infiltrate. No other acute abnormalities. IMPRESSION: The heart appears more prominent as does the mediastinum. This very well may be due to the low volume portable technique. Recommend a PA and lateral chest x-ray before discharge. Increased interstitial prominence could represent pulmonary venous congestion versus technical differences as well. No overt edema. Electronically Signed   By: Dorise Bullion III M.D   On: 04/15/2016 17:16   Dg Pelvis 1-2 Views  Result Date: 04/15/2016 CLINICAL DATA:  Patient fell out of wheelchair. EXAM:  PELVIS - 1-2 VIEW COMPARISON:  None. FINDINGS: There is a right hip fracture. IMPRESSION: There is a right hip fracture incompletely evaluated. Electronically Signed   By: Dorise Bullion III M.D   On: 04/15/2016 17:14   Dg Femur Min 2 Views Right  Result Date: 04/15/2016 CLINICAL DATA:  Pain after trauma EXAM: RIGHT FEMUR 2 VIEWS COMPARISON:  None FINDINGS: There is a subcapital right hip fracture with no dislocation. No other abnormalities. IMPRESSION: There is a subcapital right hip fracture with no dislocation. Electronically Signed   By: Dorise Bullion III M.D   On: 04/15/2016 17:15    Assessment & Plan:   There are no diagnoses linked to this encounter. I am having Ms. Heidbrink maintain her Fish Oil, Cholecalciferol, B Complex-C (SUPER B COMPLEX PO), clonazePAM, KLOR-CON, hydroxypropyl  methylcellulose / hypromellose, midodrine, omeprazole, FLUoxetine, aspirin, clonazePAM, ferrous sulfate, promethazine-codeine, cefdinir, mometasone-formoterol, traMADol, and loratadine.  No orders of the defined types were placed in this encounter.    Follow-up: No Follow-up on file.  [name], MD

## 2016-06-29 NOTE — Assessment & Plan Note (Signed)
Better  

## 2016-07-01 ENCOUNTER — Telehealth: Payer: Self-pay | Admitting: Internal Medicine

## 2016-07-01 DIAGNOSIS — Y92009 Unspecified place in unspecified non-institutional (private) residence as the place of occurrence of the external cause: Secondary | ICD-10-CM

## 2016-07-01 DIAGNOSIS — I951 Orthostatic hypotension: Secondary | ICD-10-CM

## 2016-07-01 DIAGNOSIS — J4540 Moderate persistent asthma, uncomplicated: Secondary | ICD-10-CM

## 2016-07-01 DIAGNOSIS — W19XXXD Unspecified fall, subsequent encounter: Secondary | ICD-10-CM

## 2016-07-01 DIAGNOSIS — R296 Repeated falls: Secondary | ICD-10-CM

## 2016-07-01 NOTE — Telephone Encounter (Signed)
Carolyn with [facility] stated that their office doesn't do respiratory orders and that the orders needs to be changed to say PT/OT, no RT. Please advise.

## 2016-07-02 NOTE — Telephone Encounter (Signed)
OK PT/OT Pls send resp order to [facility]

## 2016-07-06 NOTE — Telephone Encounter (Signed)
This was fixed

## 2016-07-06 NOTE — Telephone Encounter (Signed)
The daughter called. She works for [employer]. [facility].States these order are not right. I informed her that we are aware they do not have RT and it was being sent in [place]. So I informed her you are working on fixing that. Please call the daughter back once this is taking care of. Thank you.

## 2016-07-12 DIAGNOSIS — M1990 Unspecified osteoarthritis, unspecified site: Secondary | ICD-10-CM | POA: Diagnosis not present

## 2016-07-12 DIAGNOSIS — J4540 Moderate persistent asthma, uncomplicated: Secondary | ICD-10-CM | POA: Diagnosis not present

## 2016-07-12 DIAGNOSIS — G4733 Obstructive sleep apnea (adult) (pediatric): Secondary | ICD-10-CM | POA: Diagnosis not present

## 2016-07-12 DIAGNOSIS — W19XXXD Unspecified fall, subsequent encounter: Secondary | ICD-10-CM | POA: Diagnosis not present

## 2016-07-12 DIAGNOSIS — F329 Major depressive disorder, single episode, unspecified: Secondary | ICD-10-CM | POA: Diagnosis not present

## 2016-07-12 DIAGNOSIS — Z6837 Body mass index (BMI) 37.0-37.9, adult: Secondary | ICD-10-CM | POA: Diagnosis not present

## 2016-07-12 DIAGNOSIS — I951 Orthostatic hypotension: Secondary | ICD-10-CM | POA: Diagnosis not present

## 2016-07-12 DIAGNOSIS — I10 Essential (primary) hypertension: Secondary | ICD-10-CM | POA: Diagnosis not present

## 2016-07-12 DIAGNOSIS — Z87891 Personal history of nicotine dependence: Secondary | ICD-10-CM | POA: Diagnosis not present

## 2016-07-12 DIAGNOSIS — F419 Anxiety disorder, unspecified: Secondary | ICD-10-CM | POA: Diagnosis not present

## 2016-07-12 DIAGNOSIS — I872 Venous insufficiency (chronic) (peripheral): Secondary | ICD-10-CM | POA: Diagnosis not present

## 2016-07-12 DIAGNOSIS — E785 Hyperlipidemia, unspecified: Secondary | ICD-10-CM | POA: Diagnosis not present

## 2016-07-12 DIAGNOSIS — Z7982 Long term (current) use of aspirin: Secondary | ICD-10-CM | POA: Diagnosis not present

## 2016-07-14 ENCOUNTER — Telehealth: Payer: Self-pay | Admitting: Emergency Medicine

## 2016-07-14 NOTE — Telephone Encounter (Signed)
Routing to dr plotnikov, please advise, thanks 

## 2016-07-14 NOTE — Telephone Encounter (Signed)
[facility] called and stated they need verbal orders to go out and see patient for OT 2 times a week for 2 weeks and 1 time a week for 2 weeks. Please advise thanks.

## 2016-07-14 NOTE — Telephone Encounter (Signed)
OK. Thx

## 2016-07-15 NOTE — Telephone Encounter (Signed)
Left message advising ok for OT per dr plotnikov, call back if any questions

## 2016-07-16 ENCOUNTER — Telehealth: Payer: Self-pay

## 2016-07-16 NOTE — Telephone Encounter (Signed)
Dtr called and rq rf for aspirin and clonazepam

## 2016-07-17 NOTE — Telephone Encounter (Signed)
OK to ref both Thx

## 2016-07-19 MED ORDER — ASPIRIN 81 MG PO TBEC: 81.0000 mg | DELAYED_RELEASE_TABLET | Freq: Two times a day (BID) | ORAL | 0 refills | Status: DC

## 2016-07-19 MED ORDER — CLONAZEPAM 0.5 MG PO TABS: 0.5000 mg | ORAL_TABLET | Freq: Two times a day (BID) | ORAL | 2 refills | Status: DC | PRN

## 2016-07-19 NOTE — Telephone Encounter (Signed)
Aspirin erx done.  Clonazepam printed.

## 2016-07-19 NOTE — Telephone Encounter (Signed)
rx faxed to [facility] 

## 2016-07-20 DIAGNOSIS — W19XXXD Unspecified fall, subsequent encounter: Secondary | ICD-10-CM | POA: Diagnosis not present

## 2016-07-20 DIAGNOSIS — I10 Essential (primary) hypertension: Secondary | ICD-10-CM | POA: Diagnosis not present

## 2016-07-20 DIAGNOSIS — M1990 Unspecified osteoarthritis, unspecified site: Secondary | ICD-10-CM | POA: Diagnosis not present

## 2016-07-20 DIAGNOSIS — I951 Orthostatic hypotension: Secondary | ICD-10-CM | POA: Diagnosis not present

## 2016-07-20 DIAGNOSIS — J4540 Moderate persistent asthma, uncomplicated: Secondary | ICD-10-CM | POA: Diagnosis not present

## 2016-07-20 DIAGNOSIS — I872 Venous insufficiency (chronic) (peripheral): Secondary | ICD-10-CM | POA: Diagnosis not present

## 2016-07-21 DIAGNOSIS — J4540 Moderate persistent asthma, uncomplicated: Secondary | ICD-10-CM | POA: Diagnosis not present

## 2016-07-21 DIAGNOSIS — M1990 Unspecified osteoarthritis, unspecified site: Secondary | ICD-10-CM | POA: Diagnosis not present

## 2016-07-21 DIAGNOSIS — I872 Venous insufficiency (chronic) (peripheral): Secondary | ICD-10-CM | POA: Diagnosis not present

## 2016-07-21 DIAGNOSIS — W19XXXD Unspecified fall, subsequent encounter: Secondary | ICD-10-CM | POA: Diagnosis not present

## 2016-07-21 DIAGNOSIS — I951 Orthostatic hypotension: Secondary | ICD-10-CM | POA: Diagnosis not present

## 2016-07-21 DIAGNOSIS — I10 Essential (primary) hypertension: Secondary | ICD-10-CM | POA: Diagnosis not present

## 2016-07-22 DIAGNOSIS — W19XXXD Unspecified fall, subsequent encounter: Secondary | ICD-10-CM | POA: Diagnosis not present

## 2016-07-22 DIAGNOSIS — I872 Venous insufficiency (chronic) (peripheral): Secondary | ICD-10-CM | POA: Diagnosis not present

## 2016-07-22 DIAGNOSIS — I10 Essential (primary) hypertension: Secondary | ICD-10-CM | POA: Diagnosis not present

## 2016-07-22 DIAGNOSIS — M1990 Unspecified osteoarthritis, unspecified site: Secondary | ICD-10-CM | POA: Diagnosis not present

## 2016-07-22 DIAGNOSIS — I951 Orthostatic hypotension: Secondary | ICD-10-CM | POA: Diagnosis not present

## 2016-07-22 DIAGNOSIS — J4540 Moderate persistent asthma, uncomplicated: Secondary | ICD-10-CM | POA: Diagnosis not present

## 2016-07-23 DIAGNOSIS — I10 Essential (primary) hypertension: Secondary | ICD-10-CM | POA: Diagnosis not present

## 2016-07-23 DIAGNOSIS — I951 Orthostatic hypotension: Secondary | ICD-10-CM | POA: Diagnosis not present

## 2016-07-23 DIAGNOSIS — W19XXXD Unspecified fall, subsequent encounter: Secondary | ICD-10-CM | POA: Diagnosis not present

## 2016-07-23 DIAGNOSIS — M1990 Unspecified osteoarthritis, unspecified site: Secondary | ICD-10-CM | POA: Diagnosis not present

## 2016-07-23 DIAGNOSIS — J4540 Moderate persistent asthma, uncomplicated: Secondary | ICD-10-CM | POA: Diagnosis not present

## 2016-07-23 DIAGNOSIS — I872 Venous insufficiency (chronic) (peripheral): Secondary | ICD-10-CM | POA: Diagnosis not present

## 2016-07-27 DIAGNOSIS — W19XXXD Unspecified fall, subsequent encounter: Secondary | ICD-10-CM | POA: Diagnosis not present

## 2016-07-27 DIAGNOSIS — M1990 Unspecified osteoarthritis, unspecified site: Secondary | ICD-10-CM | POA: Diagnosis not present

## 2016-07-27 DIAGNOSIS — I872 Venous insufficiency (chronic) (peripheral): Secondary | ICD-10-CM | POA: Diagnosis not present

## 2016-07-27 DIAGNOSIS — J4540 Moderate persistent asthma, uncomplicated: Secondary | ICD-10-CM | POA: Diagnosis not present

## 2016-07-27 DIAGNOSIS — I10 Essential (primary) hypertension: Secondary | ICD-10-CM | POA: Diagnosis not present

## 2016-07-27 DIAGNOSIS — I951 Orthostatic hypotension: Secondary | ICD-10-CM | POA: Diagnosis not present

## 2016-07-28 DIAGNOSIS — I872 Venous insufficiency (chronic) (peripheral): Secondary | ICD-10-CM | POA: Diagnosis not present

## 2016-07-28 DIAGNOSIS — M1990 Unspecified osteoarthritis, unspecified site: Secondary | ICD-10-CM | POA: Diagnosis not present

## 2016-07-28 DIAGNOSIS — J4540 Moderate persistent asthma, uncomplicated: Secondary | ICD-10-CM | POA: Diagnosis not present

## 2016-07-28 DIAGNOSIS — I10 Essential (primary) hypertension: Secondary | ICD-10-CM | POA: Diagnosis not present

## 2016-07-28 DIAGNOSIS — W19XXXD Unspecified fall, subsequent encounter: Secondary | ICD-10-CM | POA: Diagnosis not present

## 2016-07-28 DIAGNOSIS — I951 Orthostatic hypotension: Secondary | ICD-10-CM | POA: Diagnosis not present

## 2016-07-29 DIAGNOSIS — J4540 Moderate persistent asthma, uncomplicated: Secondary | ICD-10-CM | POA: Diagnosis not present

## 2016-07-29 DIAGNOSIS — I872 Venous insufficiency (chronic) (peripheral): Secondary | ICD-10-CM | POA: Diagnosis not present

## 2016-07-29 DIAGNOSIS — I951 Orthostatic hypotension: Secondary | ICD-10-CM | POA: Diagnosis not present

## 2016-07-29 DIAGNOSIS — I10 Essential (primary) hypertension: Secondary | ICD-10-CM | POA: Diagnosis not present

## 2016-07-29 DIAGNOSIS — W19XXXD Unspecified fall, subsequent encounter: Secondary | ICD-10-CM | POA: Diagnosis not present

## 2016-07-29 DIAGNOSIS — M1990 Unspecified osteoarthritis, unspecified site: Secondary | ICD-10-CM | POA: Diagnosis not present

## 2016-07-30 DIAGNOSIS — M1990 Unspecified osteoarthritis, unspecified site: Secondary | ICD-10-CM | POA: Diagnosis not present

## 2016-07-30 DIAGNOSIS — I872 Venous insufficiency (chronic) (peripheral): Secondary | ICD-10-CM | POA: Diagnosis not present

## 2016-07-30 DIAGNOSIS — I951 Orthostatic hypotension: Secondary | ICD-10-CM | POA: Diagnosis not present

## 2016-07-30 DIAGNOSIS — W19XXXD Unspecified fall, subsequent encounter: Secondary | ICD-10-CM | POA: Diagnosis not present

## 2016-07-30 DIAGNOSIS — J4540 Moderate persistent asthma, uncomplicated: Secondary | ICD-10-CM | POA: Diagnosis not present

## 2016-07-30 DIAGNOSIS — I10 Essential (primary) hypertension: Secondary | ICD-10-CM | POA: Diagnosis not present

## 2016-08-03 DIAGNOSIS — M1990 Unspecified osteoarthritis, unspecified site: Secondary | ICD-10-CM | POA: Diagnosis not present

## 2016-08-03 DIAGNOSIS — I951 Orthostatic hypotension: Secondary | ICD-10-CM | POA: Diagnosis not present

## 2016-08-03 DIAGNOSIS — I872 Venous insufficiency (chronic) (peripheral): Secondary | ICD-10-CM | POA: Diagnosis not present

## 2016-08-03 DIAGNOSIS — W19XXXD Unspecified fall, subsequent encounter: Secondary | ICD-10-CM | POA: Diagnosis not present

## 2016-08-03 DIAGNOSIS — I10 Essential (primary) hypertension: Secondary | ICD-10-CM | POA: Diagnosis not present

## 2016-08-03 DIAGNOSIS — J4540 Moderate persistent asthma, uncomplicated: Secondary | ICD-10-CM | POA: Diagnosis not present

## 2016-08-06 DIAGNOSIS — I872 Venous insufficiency (chronic) (peripheral): Secondary | ICD-10-CM | POA: Diagnosis not present

## 2016-08-06 DIAGNOSIS — M1990 Unspecified osteoarthritis, unspecified site: Secondary | ICD-10-CM | POA: Diagnosis not present

## 2016-08-06 DIAGNOSIS — J4540 Moderate persistent asthma, uncomplicated: Secondary | ICD-10-CM | POA: Diagnosis not present

## 2016-08-06 DIAGNOSIS — I10 Essential (primary) hypertension: Secondary | ICD-10-CM | POA: Diagnosis not present

## 2016-08-06 DIAGNOSIS — I951 Orthostatic hypotension: Secondary | ICD-10-CM | POA: Diagnosis not present

## 2016-08-06 DIAGNOSIS — W19XXXD Unspecified fall, subsequent encounter: Secondary | ICD-10-CM | POA: Diagnosis not present

## 2016-08-10 DIAGNOSIS — I10 Essential (primary) hypertension: Secondary | ICD-10-CM | POA: Diagnosis not present

## 2016-08-10 DIAGNOSIS — I951 Orthostatic hypotension: Secondary | ICD-10-CM | POA: Diagnosis not present

## 2016-08-10 DIAGNOSIS — W19XXXD Unspecified fall, subsequent encounter: Secondary | ICD-10-CM | POA: Diagnosis not present

## 2016-08-10 DIAGNOSIS — J4540 Moderate persistent asthma, uncomplicated: Secondary | ICD-10-CM | POA: Diagnosis not present

## 2016-08-10 DIAGNOSIS — M1990 Unspecified osteoarthritis, unspecified site: Secondary | ICD-10-CM | POA: Diagnosis not present

## 2016-08-10 DIAGNOSIS — I872 Venous insufficiency (chronic) (peripheral): Secondary | ICD-10-CM | POA: Diagnosis not present

## 2016-08-11 DIAGNOSIS — M1990 Unspecified osteoarthritis, unspecified site: Secondary | ICD-10-CM | POA: Diagnosis not present

## 2016-08-11 DIAGNOSIS — I951 Orthostatic hypotension: Secondary | ICD-10-CM | POA: Diagnosis not present

## 2016-08-11 DIAGNOSIS — I872 Venous insufficiency (chronic) (peripheral): Secondary | ICD-10-CM | POA: Diagnosis not present

## 2016-08-11 DIAGNOSIS — J4540 Moderate persistent asthma, uncomplicated: Secondary | ICD-10-CM | POA: Diagnosis not present

## 2016-08-11 DIAGNOSIS — W19XXXD Unspecified fall, subsequent encounter: Secondary | ICD-10-CM | POA: Diagnosis not present

## 2016-08-11 DIAGNOSIS — I10 Essential (primary) hypertension: Secondary | ICD-10-CM | POA: Diagnosis not present

## 2016-08-12 DIAGNOSIS — I951 Orthostatic hypotension: Secondary | ICD-10-CM | POA: Diagnosis not present

## 2016-08-12 DIAGNOSIS — J4540 Moderate persistent asthma, uncomplicated: Secondary | ICD-10-CM | POA: Diagnosis not present

## 2016-08-12 DIAGNOSIS — I10 Essential (primary) hypertension: Secondary | ICD-10-CM | POA: Diagnosis not present

## 2016-08-12 DIAGNOSIS — W19XXXD Unspecified fall, subsequent encounter: Secondary | ICD-10-CM | POA: Diagnosis not present

## 2016-08-12 DIAGNOSIS — M1990 Unspecified osteoarthritis, unspecified site: Secondary | ICD-10-CM | POA: Diagnosis not present

## 2016-08-12 DIAGNOSIS — I872 Venous insufficiency (chronic) (peripheral): Secondary | ICD-10-CM | POA: Diagnosis not present

## 2016-08-16 ENCOUNTER — Encounter: Payer: Self-pay | Admitting: Neurology

## 2016-08-16 ENCOUNTER — Ambulatory Visit (INDEPENDENT_AMBULATORY_CARE_PROVIDER_SITE_OTHER): Payer: Medicare Other | Admitting: Neurology

## 2016-08-16 VITALS — BP 100/70 | HR 77 | Ht 64.0 in | Wt 211.5 lb

## 2016-08-16 DIAGNOSIS — I951 Orthostatic hypotension: Secondary | ICD-10-CM

## 2016-08-16 DIAGNOSIS — G3184 Mild cognitive impairment, so stated: Secondary | ICD-10-CM | POA: Diagnosis not present

## 2016-08-16 MED ORDER — PYRIDOSTIGMINE BROMIDE 60 MG PO TABS: 30.0000 mg | ORAL_TABLET | Freq: Three times a day (TID) | ORAL | 5 refills | Status: DC

## 2016-08-16 NOTE — Patient Instructions (Addendum)
1.  Continue midodrine 10mg  three times daily 2.  Start mestinon 30mg  (half-tab) three times daily 3.  Always use a walker  [employer] [facility] [employer] [facility] [employer]:  [phone] Tel [place]:   [phone] www.[facility].org/resources.cfm   Resources for common questions found under "Pathways & Protocols " www.[facility].org/pathways/Pathways_Menu.htm  Resources for [facility] facilities: www.[facility]guide.com  Return to clinic in 6 months

## 2016-08-16 NOTE — Progress Notes (Signed)
Follow-up Visit   Date: 08/16/16    Sabrina Page MRN: [id number] DOB: November 20, 1933   Interim History: Sabrina Page is a 83 y.o.  female with ashtma, GERD, depression, hyperlipidemia, CKD, and peripheral vascular disease returning to the clinic for follow-up of orthostasis.  The patient was accompanied to the clinic by daughter who also provides collateral information.    History of present illness: Starting in the spring of 2017, she began having lightheadedness especially worse in the morning which improves as the day goes on.  Every time she stands, she gets lightheaded and has had 6 falls over the past year, the last occurring in May. She does not take any antihypertensive medication or diuretics, although she has leg swelling.  She was taken to the ER on 01/18/2016 after her daughter, a home health nurse, checked her orthostatic blood pressure and her SBP was noted to be in the 60s and she was very symptomatic.  She started midodrine '10mg'$  BID in early September and has noticed 30% improvement.  She has been complaint with using a walker and feels that this helps.  Her daughter also complains that patient may have some memory loss and tends to be very suspicious about things.  She states that patient has always been very jealous.  There are no delusional thoughts or hallucinations.  Patient lives with her husband, who is also suffering from memory problems. She has a caregiver that comes for a few hours and her daughter who helps with many IADLs.  No bowel/bladder dysfunction.  She also complains of dry eyes, no dry mouth.   She endorses numbness of the feet which is worse at night time.   UPDATE 08/16/2016:  She has suffered 4 falls at home and fractured her right hip in November 2017.  She was living with daughter for some time and now has returned back to an apartment with her husband.  She has a caregiver that comes 9am-12pm and 5-7pm. Daughter is feeling overwhelmed  because her parents do not want to live with her and she still has to manage their day-to-day activities. Her father wants to maintain his independence, but his dementia is making it difficult.  Patient manages medications for herself and her husband.  Daughter manages finances and is POA for both.   She continue to have lightheadedness and is compliant with her stockings and medications.  Daughter has noticed that her blood pressure can be low at home, but with activity can have SBP 160-170s.    Medications:  Current Outpatient Prescriptions on File Prior to Visit  Medication Sig Dispense Refill  . albuterol (PROVENTIL) (2.5 MG/3ML) 0.083% nebulizer solution Take 3 mLs (2.5 mg total) by nebulization every 6 (six) hours as needed for wheezing or shortness of breath. 150 mL 1  . aspirin 81 MG EC tablet Take 1 tablet (81 mg total) by mouth 2 (two) times daily. 60 tablet 0  . B Complex-C (SUPER B COMPLEX PO) Take 1 tablet by mouth every morning.     . Cholecalciferol 1000 UNITS capsule Take 1,000 Units by mouth every morning.     . clonazePAM (KLONOPIN) 0.5 MG tablet Take 1 tablet (0.5 mg total) by mouth at bedtime as needed (sleep). 1 tablet 0  . clonazePAM (KLONOPIN) 0.5 MG tablet Take 1 tablet (0.5 mg total) by mouth 2 (two) times daily as needed for anxiety. 60 tablet 2  . ferrous sulfate 325 (65 FE) MG tablet Take 1 tablet (325 mg total) by  mouth 3 (three) times daily after meals. 30 tablet 0  . FLUoxetine (PROZAC) 40 MG capsule Take 1 capsule (40 mg total) by mouth daily. 90 capsule 3  . hydroxypropyl methylcellulose / hypromellose (ISOPTO TEARS / GONIOVISC) 2.5 % ophthalmic solution Place 1 drop into both eyes daily as needed for dry eyes.    Marland Kitchen KLOR-CON 8 MEQ tablet Take 8 mEq by mouth daily.    Marland Kitchen loratadine (CLARITIN) 10 MG tablet Take 1 tablet (10 mg total) by mouth every morning. 100 tablet 3  . midodrine (PROAMATINE) 10 MG tablet Take 1 tablet (10 mg total) by mouth 3 (three) times daily. 90  tablet 5  . mometasone-formoterol (DULERA) 200-5 MCG/ACT AERO Inhale 2 puffs into the lungs 2 (two) times daily. 1 Inhaler 5  . Omega-3 Fatty Acids (FISH OIL) 1000 MG CAPS Take 1 capsule by mouth every morning.     Marland Kitchen omeprazole (PRILOSEC) 20 MG capsule Take 2 capsules (40 mg total) by mouth daily. 180 capsule 3  . traMADol (ULTRAM) 50 MG tablet TAKE 1/2 TO 1 TABLET BY MOUTH EVERY 6 HOURS AS NEEDED 120 tablet 2   No current facility-administered medications on file prior to visit.     Allergies:  Allergies  Allergen Reactions  . Atorvastatin     REACTION: upset stomach  . Enalapril Maleate   . Hctz [Hydrochlorothiazide]     Dizzy   . Lovastatin     REACTION: tongue stress  . Simvastatin     REACTION: mouth sores  . Topamax [Topiramate]     syncope    Review of Systems:  CONSTITUTIONAL: No fevers, chills, night sweats, or weight loss.  EYES: No visual changes or eye pain ENT: No hearing changes.  No history of nose bleeds.   RESPIRATORY: No cough, wheezing and shortness of breath.   CARDIOVASCULAR: Negative for chest pain, and palpitations.   GI: Negative for abdominal discomfort, blood in stools or black stools.  No recent change in bowel habits.   GU:  No history of incontinence.   MUSCLOSKELETAL: No history of joint pain or swelling.  No myalgias.   SKIN: Negative for lesions, rash, and itching.   ENDOCRINE: Negative for cold or heat intolerance, polydipsia or goiter.   PSYCH:  No depression +anxiety symptoms.   NEURO: As Above.   Vital Signs:  BP 100/70   Pulse 77   Ht '5\' 4"'$  (1.626 m)   Wt 211 lb 8 oz (95.9 kg)   SpO2 98%   BMI 36.30 kg/m   Neurological Exam: MENTAL STATUS including orientation to time, place, person, recent and remote memory, attention span and concentration, language, and fund of knowledge is fairly intact.  Speech has strong accent.  CRANIAL NERVES:  No visual field defects.  Pupils equal round and reactive to light.  Normal conjugate,  extra-ocular eye movements in all directions of gaze.  No ptosis. Normal facial sensation.  Face is symmetric. Palate elevates symmetrically.  Tongue is midline.  MOTOR:  Motor strength is 5/5 in all extremities.  No atrophy, fasciculations or abnormal movements.  No pronator drift.  Tone is normal.    MSRs:  Reflexes are 2+/4 throughout in the upper extremities and absent in the legs.  SENSORY:  Intact to vibration in the upper extremities and reduced distal to the knees bilaterally.  Marland Kitchen  COORDINATION/GAIT:  Normal finger-to- nose-finger.  Intact rapid alternating movements bilaterally.  Gait is assisted with rollator and is mildly wide-based and stable.  Data: US carotids 07/28/2015:  1-39% bilateral ICA stenosis  Labs 02/17/2016:  vitamin B12 556, vitamin B6 32.7,  SSA/B neg, copper 109, SPEP with IFE no M protein, ESR 15  MRI brain wo contrast 03/02/2016:  No acute infarct or intracranial hemorrhage.  Mild chronic microvascular changes. Moderate global atrophy. Ventricular prominence probably secondary to central atrophy rather than hydrocephalus. No intracranial mass lesion noted on this unenhanced exam. Mild cervical spondylotic changes C4-5 incompletely assessed.  IMPRESSION/PLAN: 1.  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  She does not have clinical signs of parkinson-plus syndrome such as multiple system atrophy, but will continue to follow              - Neuropathy labs returned normal              - NCS/EMG declined              - Continue midodrine to '10mg'$  TID   - Start mestinon '30mg'$  TID              - Consider Droxidopa going forward              - Continue home physical therapy              - Encouraged to use rollator at all times              - Fall precaution discussed, makes positional changes slowly to minimize lightheadedness              - Continue to use knee high compression stockings and abdominal binder  2.  Mild  cognitive impairment   - MRI brain reviewed and shows global atrophy  - Lengthy discussion on home safety and that she would probably be safer living with her daughter.  Senior care resources were provided  - Daughter is POA  - Neuropsychological testing would be difficult to perform due to language barrier             - Acetylcholinesterase inhibitors can be considered going forward  Return to clinic in 6 months   The duration of this appointment visit was 30 minutes of face-to-face time with the patient.  Greater than 50% of this time was spent in counseling, explanation of diagnosis, planning of further management, and coordination of care.   Thank you for allowing me to participate in patient's care.  If I can answer any additional questions, I would be pleased to do so.    Sincerely,    Donika K. Posey Pronto, DO

## 2016-08-17 DIAGNOSIS — W19XXXD Unspecified fall, subsequent encounter: Secondary | ICD-10-CM | POA: Diagnosis not present

## 2016-08-17 DIAGNOSIS — I10 Essential (primary) hypertension: Secondary | ICD-10-CM | POA: Diagnosis not present

## 2016-08-17 DIAGNOSIS — I951 Orthostatic hypotension: Secondary | ICD-10-CM | POA: Diagnosis not present

## 2016-08-17 DIAGNOSIS — M1990 Unspecified osteoarthritis, unspecified site: Secondary | ICD-10-CM | POA: Diagnosis not present

## 2016-08-17 DIAGNOSIS — I872 Venous insufficiency (chronic) (peripheral): Secondary | ICD-10-CM | POA: Diagnosis not present

## 2016-08-17 DIAGNOSIS — J4540 Moderate persistent asthma, uncomplicated: Secondary | ICD-10-CM | POA: Diagnosis not present

## 2016-08-18 ENCOUNTER — Telehealth: Payer: Self-pay | Admitting: Internal Medicine

## 2016-08-18 NOTE — Telephone Encounter (Signed)
Verbal Orders For OT 1 x week for 3 week  [phone] - [facility]

## 2016-08-18 NOTE — Telephone Encounter (Signed)
OK. Thx

## 2016-08-18 NOTE — Telephone Encounter (Signed)
Verbal orders given to Radovan. 

## 2016-08-19 ENCOUNTER — Other Ambulatory Visit: Payer: Self-pay | Admitting: Internal Medicine

## 2016-08-19 DIAGNOSIS — M1990 Unspecified osteoarthritis, unspecified site: Secondary | ICD-10-CM | POA: Diagnosis not present

## 2016-08-19 DIAGNOSIS — I951 Orthostatic hypotension: Secondary | ICD-10-CM | POA: Diagnosis not present

## 2016-08-19 DIAGNOSIS — W19XXXD Unspecified fall, subsequent encounter: Secondary | ICD-10-CM | POA: Diagnosis not present

## 2016-08-19 DIAGNOSIS — I872 Venous insufficiency (chronic) (peripheral): Secondary | ICD-10-CM | POA: Diagnosis not present

## 2016-08-19 DIAGNOSIS — I10 Essential (primary) hypertension: Secondary | ICD-10-CM | POA: Diagnosis not present

## 2016-08-19 DIAGNOSIS — J4540 Moderate persistent asthma, uncomplicated: Secondary | ICD-10-CM | POA: Diagnosis not present

## 2016-08-20 DIAGNOSIS — I872 Venous insufficiency (chronic) (peripheral): Secondary | ICD-10-CM | POA: Diagnosis not present

## 2016-08-20 DIAGNOSIS — I10 Essential (primary) hypertension: Secondary | ICD-10-CM | POA: Diagnosis not present

## 2016-08-20 DIAGNOSIS — M1990 Unspecified osteoarthritis, unspecified site: Secondary | ICD-10-CM | POA: Diagnosis not present

## 2016-08-20 DIAGNOSIS — I951 Orthostatic hypotension: Secondary | ICD-10-CM | POA: Diagnosis not present

## 2016-08-20 DIAGNOSIS — W19XXXD Unspecified fall, subsequent encounter: Secondary | ICD-10-CM | POA: Diagnosis not present

## 2016-08-20 DIAGNOSIS — J4540 Moderate persistent asthma, uncomplicated: Secondary | ICD-10-CM | POA: Diagnosis not present

## 2016-08-23 ENCOUNTER — Other Ambulatory Visit: Payer: Self-pay | Admitting: Internal Medicine

## 2016-08-24 ENCOUNTER — Other Ambulatory Visit: Payer: Self-pay | Admitting: Internal Medicine

## 2016-08-26 DIAGNOSIS — I951 Orthostatic hypotension: Secondary | ICD-10-CM | POA: Diagnosis not present

## 2016-08-26 DIAGNOSIS — I872 Venous insufficiency (chronic) (peripheral): Secondary | ICD-10-CM | POA: Diagnosis not present

## 2016-08-26 DIAGNOSIS — W19XXXD Unspecified fall, subsequent encounter: Secondary | ICD-10-CM | POA: Diagnosis not present

## 2016-08-26 DIAGNOSIS — I10 Essential (primary) hypertension: Secondary | ICD-10-CM | POA: Diagnosis not present

## 2016-08-26 DIAGNOSIS — J4540 Moderate persistent asthma, uncomplicated: Secondary | ICD-10-CM | POA: Diagnosis not present

## 2016-08-26 DIAGNOSIS — M1990 Unspecified osteoarthritis, unspecified site: Secondary | ICD-10-CM | POA: Diagnosis not present

## 2016-09-03 ENCOUNTER — Telehealth: Payer: Self-pay | Admitting: Internal Medicine

## 2016-09-03 DIAGNOSIS — I951 Orthostatic hypotension: Secondary | ICD-10-CM | POA: Diagnosis not present

## 2016-09-03 DIAGNOSIS — I10 Essential (primary) hypertension: Secondary | ICD-10-CM | POA: Diagnosis not present

## 2016-09-03 DIAGNOSIS — M1990 Unspecified osteoarthritis, unspecified site: Secondary | ICD-10-CM | POA: Diagnosis not present

## 2016-09-03 DIAGNOSIS — J4540 Moderate persistent asthma, uncomplicated: Secondary | ICD-10-CM | POA: Diagnosis not present

## 2016-09-03 DIAGNOSIS — W19XXXD Unspecified fall, subsequent encounter: Secondary | ICD-10-CM | POA: Diagnosis not present

## 2016-09-03 DIAGNOSIS — I872 Venous insufficiency (chronic) (peripheral): Secondary | ICD-10-CM | POA: Diagnosis not present

## 2016-09-03 NOTE — Telephone Encounter (Signed)
[facility] , Radovan      Pt has been discharged from OT, She has met most of her goals.  Only thing pt forget sometimes to g

## 2016-09-06 NOTE — Telephone Encounter (Signed)
Last sentence not finished, do you recall the rest of the phone call?

## 2016-09-16 ENCOUNTER — Other Ambulatory Visit: Payer: Self-pay | Admitting: Internal Medicine

## 2016-09-16 NOTE — Telephone Encounter (Signed)
Okay to refill?  RX loaded and ready to print

## 2016-09-16 NOTE — Telephone Encounter (Signed)
Pt daughter called for refill of clonazePAM (KLONOPIN) 0.5 MG tablet   [facility] [address]

## 2016-09-20 ENCOUNTER — Telehealth: Payer: Self-pay | Admitting: *Deleted

## 2016-09-20 MED ORDER — CLONAZEPAM 0.5 MG PO TABS: 0.5000 mg | ORAL_TABLET | Freq: Two times a day (BID) | ORAL | 2 refills | Status: DC | PRN

## 2016-09-20 NOTE — Telephone Encounter (Signed)
Rec'd fax pt requesting refills on her clonazepam.../lmb

## 2016-09-20 NOTE — Telephone Encounter (Signed)
Printed script, MD signed, and [facility]ed [facility].../lmb

## 2016-09-20 NOTE — Telephone Encounter (Signed)
OK to fill this/these prescription(s) with additional refills x2 Needs to have an OV every 3 months Thank you!  

## 2016-09-23 ENCOUNTER — Ambulatory Visit (INDEPENDENT_AMBULATORY_CARE_PROVIDER_SITE_OTHER): Payer: Medicare Other | Admitting: Internal Medicine

## 2016-09-23 ENCOUNTER — Encounter: Payer: Self-pay | Admitting: Internal Medicine

## 2016-09-23 ENCOUNTER — Other Ambulatory Visit (INDEPENDENT_AMBULATORY_CARE_PROVIDER_SITE_OTHER): Payer: Medicare Other

## 2016-09-23 VITALS — BP 138/82 | HR 75 | Temp 97.8°F | Ht 64.0 in | Wt 210.0 lb

## 2016-09-23 DIAGNOSIS — I951 Orthostatic hypotension: Secondary | ICD-10-CM

## 2016-09-23 DIAGNOSIS — M545 Low back pain: Secondary | ICD-10-CM | POA: Diagnosis not present

## 2016-09-23 DIAGNOSIS — M79604 Pain in right leg: Secondary | ICD-10-CM

## 2016-09-23 DIAGNOSIS — I10 Essential (primary) hypertension: Secondary | ICD-10-CM | POA: Diagnosis not present

## 2016-09-23 DIAGNOSIS — Z23 Encounter for immunization: Secondary | ICD-10-CM | POA: Diagnosis not present

## 2016-09-23 LAB — HEPATIC FUNCTION PANEL
ALT: 12 U/L (ref 0–35)
AST: 12 U/L (ref 0–37)
Albumin: 4.2 g/dL (ref 3.5–5.2)
Alkaline Phosphatase: 65 U/L (ref 39–117)
Bilirubin, Direct: 0.1 mg/dL (ref 0.0–0.3)
Total Bilirubin: 0.6 mg/dL (ref 0.2–1.2)
Total Protein: 7.2 g/dL (ref 6.0–8.3)

## 2016-09-23 LAB — BASIC METABOLIC PANEL
BUN: 18 mg/dL (ref 6–23)
CO2: 34 mEq/L — ABNORMAL HIGH (ref 19–32)
Calcium: 9.6 mg/dL (ref 8.4–10.5)
Chloride: 103 mEq/L (ref 96–112)
Creatinine, Ser: 1.04 mg/dL (ref 0.40–1.20)
GFR: 54.06 mL/min — ABNORMAL LOW (ref >60.00)
Glucose, Bld: 94 mg/dL (ref 70–99)
Potassium: 4.5 mEq/L (ref 3.5–5.1)
Sodium: 141 mEq/L (ref 135–145)

## 2016-09-23 LAB — TSH: TSH: 1.71 u[IU]/mL (ref 0.35–4.50)

## 2016-09-23 NOTE — Addendum Note (Signed)
Addended by: Karren Cobble on: 09/23/2016 10:40 AM   Modules accepted: Orders

## 2016-09-23 NOTE — Assessment & Plan Note (Signed)
Walker Tramadol prn

## 2016-09-23 NOTE — Progress Notes (Signed)
Pre visit review using our clinic review tool, if applicable. No additional management support is needed unless otherwise documented below in the visit note. 

## 2016-09-23 NOTE — Assessment & Plan Note (Signed)
Indapamide

## 2016-09-23 NOTE — Assessment & Plan Note (Addendum)
No syncope On Midodrin, Prozac, Mestinon

## 2016-09-23 NOTE — Progress Notes (Signed)
Subjective:  Patient ID: Sabrina Page, female    DOB: Feb 04, 1934  Age: 83 y.o. MRN: [id number]  CC: No chief complaint on file.   HPI Yosselin Currey presents for asthma, spinal stenosis, obesity, depression f/u  Outpatient Medications Prior to Visit  Medication Sig Dispense Refill  . aspirin 81 MG EC tablet Take 1 tablet (81 mg total) by mouth 2 (two) times daily. 60 tablet 0  . B Complex-C (SUPER B COMPLEX PO) Take 1 tablet by mouth every morning.     . Cholecalciferol 1000 UNITS capsule Take 1,000 Units by mouth every morning.     . clonazePAM (KLONOPIN) 0.5 MG tablet Take 1 tablet (0.5 mg total) by mouth 2 (two) times daily as needed for anxiety. 60 tablet 2  . ferrous sulfate 325 (65 FE) MG tablet Take 1 tablet (325 mg total) by mouth 3 (three) times daily after meals. 30 tablet 0  . FLUoxetine (PROZAC) 40 MG capsule Take 1 capsule (40 mg total) by mouth daily. 90 capsule 3  . KLOR-CON 8 MEQ tablet TAKE 1 TABLET EVERY MORNING 30 tablet 5  . loratadine (CLARITIN) 10 MG tablet Take 1 tablet (10 mg total) by mouth every morning. 100 tablet 3  . midodrine (PROAMATINE) 10 MG tablet TAKE 1 TABLET (10 MG TOTAL) BY MOUTH 3 (THREE) TIMES DAILY. 90 tablet 5  . Omega-3 Fatty Acids (FISH OIL) 1000 MG CAPS Take 1 capsule by mouth every morning.     Marland Kitchen omeprazole (PRILOSEC) 20 MG capsule Take 2 capsules (40 mg total) by mouth daily. 180 capsule 3  . pyridostigmine (MESTINON) 60 MG tablet Take 0.5 tablets (30 mg total) by mouth 3 (three) times daily. 90 tablet 5  . traMADol (ULTRAM) 50 MG tablet TAKE 1/2 TO 1 TABLET BY MOUTH EVERY 6 HOURS AS NEEDED 120 tablet 2  . albuterol (PROVENTIL) (2.5 MG/3ML) 0.083% nebulizer solution Take 3 mLs (2.5 mg total) by nebulization every 6 (six) hours as needed for wheezing or shortness of breath. (Patient not taking: Reported on 09/23/2016) 150 mL 1  . mometasone-formoterol (DULERA) 200-5 MCG/ACT AERO Inhale 2 puffs into the lungs 2 (two) times daily.  (Patient not taking: Reported on 09/23/2016) 1 Inhaler 5  . hydroxypropyl methylcellulose / hypromellose (ISOPTO TEARS / GONIOVISC) 2.5 % ophthalmic solution Place 1 drop into both eyes daily as needed for dry eyes.    Marland Kitchen KLOR-CON 8 MEQ tablet Take 8 mEq by mouth daily.    . midodrine (PROAMATINE) 10 MG tablet TAKE 1 TABLET (10 MG TOTAL) BY MOUTH 3 (THREE) TIMES DAILY. 90 tablet 5   No facility-administered medications prior to visit.     ROS Review of Systems  Constitutional: Negative for activity change, appetite change, chills, fatigue and unexpected weight change.  HENT: Negative for congestion, mouth sores and sinus pressure.   Eyes: Negative for visual disturbance.  Respiratory: Negative for cough and chest tightness.   Gastrointestinal: Negative for abdominal pain and nausea.  Genitourinary: Negative for difficulty urinating, frequency and vaginal pain.  Musculoskeletal: Negative for back pain and gait problem.  Skin: Negative for pallor and rash.  Neurological: Negative for dizziness, tremors, weakness, numbness and headaches.  Psychiatric/Behavioral: Negative for confusion, sleep disturbance and suicidal ideas.    Objective:  BP 138/82 (BP Location: Left Arm, Patient Position: Sitting, Cuff Size: Normal)   Pulse 75   Temp 97.8 F (36.6 C) (Oral)   Ht 5\' 4"  (1.626 m)   Wt 210 lb (95.3 kg)  SpO2 98%   BMI 36.05 kg/m   BP Readings from Last 3 Encounters:  09/23/16 138/82  08/16/16 100/70  06/29/16 112/74    Wt Readings from Last 3 Encounters:  09/23/16 210 lb (95.3 kg)  08/16/16 211 lb 8 oz (95.9 kg)  06/29/16 211 lb 8 oz (95.9 kg)    Physical Exam  Constitutional: She appears well-developed. No distress.  HENT:  Head: Normocephalic.  Right Ear: External ear normal.  Left Ear: External ear normal.  Nose: Nose normal.  Mouth/Throat: Oropharynx is clear and moist.  Eyes: Conjunctivae are normal. Pupils are equal, round, and reactive to light. Right eye exhibits  no discharge. Left eye exhibits no discharge.  Neck: Normal range of motion. Neck supple. No JVD present. No tracheal deviation present. No thyromegaly present.  Cardiovascular: Normal rate, regular rhythm and normal heart sounds.   Pulmonary/Chest: No stridor. No respiratory distress. She has no wheezes.  Abdominal: Soft. Bowel sounds are normal. She exhibits no distension and no mass. There is no tenderness. There is no rebound and no guarding.  Musculoskeletal: She exhibits no edema or tenderness.  Lymphadenopathy:    She has no cervical adenopathy.  Neurological: She displays normal reflexes. No cranial nerve deficit. She exhibits normal muscle tone. Coordination normal.  Skin: No rash noted. No erythema.  Psychiatric: She has a normal mood and affect. Her behavior is normal. Judgment and thought content normal.    Lab Results  Component Value Date   WBC 6.5 04/20/2016   HGB 10.9 (L) 04/20/2016   HCT 33.9 (L) 04/20/2016   PLT 177 04/20/2016   GLUCOSE 92 04/19/2016   CHOL 270 (H) 06/12/2014   TRIG 132.0 06/12/2014   HDL 52.40 06/12/2014   LDLDIRECT 159.1 05/16/2012   LDLCALC 191 (H) 06/12/2014   ALT 22 04/16/2016   AST 26 04/16/2016   NA 137 04/19/2016   K 4.3 04/19/2016   CL 102 04/19/2016   CREATININE 0.94 04/19/2016   BUN 17 04/19/2016   CO2 31 04/19/2016   TSH 2.703 01/19/2016   INR 1.07 04/16/2016   HGBA1C 5.6 12/25/2015    Dg Chest 1 View  Result Date: 04/15/2016 CLINICAL DATA:  Pain after trauma EXAM: CHEST 1 VIEW COMPARISON:  January 18, 2016 FINDINGS: The heart size is more prominent the interval, probably due to the portable technique. The mediastinum is also more prominent. Increased interstitial prominence without overt edema. No focal infiltrate. No other acute abnormalities. IMPRESSION: The heart appears more prominent as does the mediastinum. This very well may be due to the low volume portable technique. Recommend a PA and lateral chest x-ray before  discharge. Increased interstitial prominence could represent pulmonary venous congestion versus technical differences as well. No overt edema. Electronically Signed   By: Dorise Bullion III M.D   On: 04/15/2016 17:16   Dg Pelvis 1-2 Views  Result Date: 04/15/2016 CLINICAL DATA:  Patient fell out of wheelchair. EXAM: PELVIS - 1-2 VIEW COMPARISON:  None. FINDINGS: There is a right hip fracture. IMPRESSION: There is a right hip fracture incompletely evaluated. Electronically Signed   By: Dorise Bullion III M.D   On: 04/15/2016 17:14   Dg Femur Min 2 Views Right  Result Date: 04/15/2016 CLINICAL DATA:  Pain after trauma EXAM: RIGHT FEMUR 2 VIEWS COMPARISON:  None FINDINGS: There is a subcapital right hip fracture with no dislocation. No other abnormalities. IMPRESSION: There is a subcapital right hip fracture with no dislocation. Electronically Signed   By: Shanon Brow  Mee Hives M.D   On: 04/15/2016 17:15    Assessment & Plan:   There are no diagnoses linked to this encounter. I have discontinued Ms. Nass's hydroxypropyl methylcellulose / hypromellose. I am also having her maintain her Fish Oil, Cholecalciferol, B Complex-C (SUPER B COMPLEX PO), ferrous sulfate, albuterol, mometasone-formoterol, FLUoxetine, omeprazole, traMADol, loratadine, aspirin, pyridostigmine, midodrine, KLOR-CON, and clonazePAM.  No orders of the defined types were placed in this encounter.    Follow-up: No Follow-up on file.  [name], MD

## 2016-09-23 NOTE — Assessment & Plan Note (Signed)
Wt Readings from Last 3 Encounters:  09/23/16 210 lb (95.3 kg)  08/16/16 211 lb 8 oz (95.9 kg)  06/29/16 211 lb 8 oz (95.9 kg)

## 2016-10-02 DIAGNOSIS — F329 Major depressive disorder, single episode, unspecified: Secondary | ICD-10-CM | POA: Diagnosis not present

## 2016-10-02 DIAGNOSIS — J449 Chronic obstructive pulmonary disease, unspecified: Secondary | ICD-10-CM | POA: Diagnosis not present

## 2016-10-02 DIAGNOSIS — I739 Peripheral vascular disease, unspecified: Secondary | ICD-10-CM | POA: Diagnosis not present

## 2016-10-02 DIAGNOSIS — G908 Other disorders of autonomic nervous system: Secondary | ICD-10-CM | POA: Diagnosis not present

## 2016-10-02 DIAGNOSIS — E669 Obesity, unspecified: Secondary | ICD-10-CM | POA: Diagnosis not present

## 2016-10-02 DIAGNOSIS — I5030 Unspecified diastolic (congestive) heart failure: Secondary | ICD-10-CM | POA: Diagnosis not present

## 2016-10-02 DIAGNOSIS — Z9989 Dependence on other enabling machines and devices: Secondary | ICD-10-CM | POA: Diagnosis not present

## 2016-10-02 DIAGNOSIS — G4733 Obstructive sleep apnea (adult) (pediatric): Secondary | ICD-10-CM | POA: Diagnosis not present

## 2016-10-02 DIAGNOSIS — N189 Chronic kidney disease, unspecified: Secondary | ICD-10-CM | POA: Diagnosis not present

## 2016-10-02 DIAGNOSIS — M1991 Primary osteoarthritis, unspecified site: Secondary | ICD-10-CM | POA: Diagnosis not present

## 2016-10-08 ENCOUNTER — Ambulatory Visit: Payer: Medicare Other | Admitting: Internal Medicine

## 2016-12-20 ENCOUNTER — Other Ambulatory Visit: Payer: Self-pay

## 2016-12-20 NOTE — Patient Outreach (Signed)
[facility]) Care Management  12/20/2016  Sabrina Page 1933/10/10 [id number]   Telephone Screen  Referral Date: 12/16/16 Referral Source: EMMI Prevent  Referral Reason: "COPD, feels like health is getting worse"  Insurance: Medicare & Medicaid  Incoming call from patient's dtr-[name]. She is currently driving but requested that RN CM continue with call. Screening completed.   Social: Patient recently returned to her own apartment( [address] Dtr states she had previously been staying with her but patient wanted to return to her own home. Patient lives with spouse who has multiple medical issues of his own including dementia. Dtr staets that patient has caregiver through agency(unable to recall name) that comes to bathe patient in the mornings. She states that aide is supposed to stay 2.5 hrs in the morning and evening aide to stay 2.5hrs. However,she voices that aide is coming to bathe patient and does chores then leaves. Dtr concerned about her parents safety being on their own. Dtr voices that she works during the day for [employer] agency and is not always available to assist with patient care needs. She states that patient has had at least seven falls within the past year. She is unsure of how many falls within the past three months and feels that patient may be falling but not telling her. She voices that patient has "poor balance and walks like a duck." DME in the home include rollator and wheelchair.   Conditions: Per dtr patient has PMH of COPD, CHF, Asthma, spinal stenosis, broken hip(Nov 2017), "bad knees" and orthostatic hypotension. Dtr states that patient has been having ongoing issues with hypotension and has been placed on meds to help manage symptoms. Patient does become very symptomatic with low BP. She states that there is BP machine in the home but it is not working properly. Dtr reports she is able to manually check BP when she visits  with patient. Dtr also concerned that patient may be getting some dementia. She states that patient is constantly fatigued all the time as well.   Medications: Dtr reports that patient taking greater than 10 meds. Patient fills med planner weekly for herself as well as spouse. Dtr feels like patient is abel to manage her meds at this time.    Appointments: patient followed by PCP(Dr. Plotniknov) last seen on 09/23/16 and neurologist-dtr unable to recall MD name. Dtr transports patient back and forth to MD appts.   Advance Directives; Dtr states that patient has living will and [facility] POA. No copy noted on file.  Consent: [facility] services reviewed and dicussed with dtr. Dtr interested in any help she can get to assist with patient medical issues. Dtr requests that [facility] phone calls and/or visits be coordinated with her at [phone]. Patient speaks limited English and difficult to understand on the phone per dtr.   Plan: RN CM will notify [facility] administrative assistant of case status. RN CM will send referral to [facility] RN for further in home eval/assessment of care needs and management of chronic conditions. RN CM will send [facility] referral for possible assistance with community resources related to in home support and assistance with caregiver burnout.  RN CM will send [facility] pharmacy referral for polypharmacy med review.    Enzo Montgomery, RN,BSN,CCM [place] Management Telephonic Care Management Coordinator Direct Phone: [phone] Toll Free: [phone] Fax: [phone]

## 2016-12-20 NOTE — Patient Outreach (Signed)
[facility]) Care Management  12/20/2016  Sabrina Page 04-05-1934 [id number]   Telephone Screen  Referral Date: 12/16/16 Referral Source: EMMI Prevent  Referral Reason: "COPD, feels like health is getting worse"  Insurance: Medicare & Medicaid   Voicemail message received from dtr. Return call placed to dtr. Dtr voices that she can not talk at present as she is working and will call RN CM back.     Plan: RN CM will await return call from dtr and call dtr back if no response within three business days.    Enzo Montgomery, RN,BSN,CCM [place] Management Telephonic Care Management Coordinator Direct Phone: [phone] Toll Free: [phone] Fax: [phone]

## 2016-12-20 NOTE — Patient Outreach (Signed)
[place] [facility]) Care Management  12/20/2016  Kacee Sukhu 12-03-1933 [id number]     Telephone Screen  Referral Date: 12/16/16 Referral Source: EMMI Prevent  Referral Reason: "COPD, feels like health is getting worse"  Insurance: Medicare & Medicaid   Outreach attempt # 1 to patient. Spoke with dtr who reports she handles patient's affairs(ROI on file). Dtr requested call back at another time as she was currently driving.     Plan: RN CM will make outreach attempt to patient within three business days.    Enzo Montgomery, RN,BSN,CCM [place] Management Telephonic Care Management Coordinator Direct Phone: [phone] Toll Free: [phone] Fax: [phone]

## 2016-12-21 ENCOUNTER — Other Ambulatory Visit: Payer: Self-pay | Admitting: Licensed Clinical Social Worker

## 2016-12-21 NOTE — Patient Outreach (Signed)
[facility]) Care Management  12/21/2016  Sabrina Page 1933/11/30 [id number]  Assessment-CSW completed initial outreach attempt today to daughter after receiving new referral for personal care resource assistance. CSW unable to reach patient's daughter successfully. CSW left a HIPPA compliant voice message encouraging a return call once available.  Plan-CSW will await return call or complete an additional outreach if needed.  Eula Fried, BSW, MSW, [email or web address] Phone: [phone] Fax: [phone]

## 2016-12-23 ENCOUNTER — Telehealth: Payer: Self-pay | Admitting: Pharmacist

## 2016-12-23 NOTE — Patient Outreach (Signed)
[facility]) Care Management  12/23/2016  Sabrina Page [birth year] [id number]   Called patient regarding medication management per referral from [place], Nada Boozer. No answer. HIPAA compliant message left on voicemail.  Plan:  Call patient back in 3-5 business days.  Elayne Guerin, PharmD, [place] Clinical Pharmacist [phone]

## 2016-12-24 ENCOUNTER — Other Ambulatory Visit: Payer: Self-pay | Admitting: Licensed Clinical Social Worker

## 2016-12-24 NOTE — Patient Outreach (Signed)
[facility]) Care Management  12/24/2016  Sabrina Page 08-25-1933 [id number]  Assessment- CSW completed second outreach attempt and was able to successfully reach patient's daughter. HIPPA verifications were received. CSW introduced self, reason for call and [facility] social work services. Daughter reports "I'm not sure what else you can do for me." Daughter shares that patient lives with spouse who has dementia and that daughter works during the day at a [employer] agency and is not always able to assist patient with her care needs. Patient receives Full Adult Medicaid and receives personal care services through [facility]. Patient and has two aides through [facility] services that are suppose to stay for 2.5 hours each every weekday morning and evening. Daughter reports that the morning aide is "very good" and "does what she is suppose to" but only stays 1.5 hours in the morning. Daughter reports that patient's evening aide is "even worse" and "doesn't stay long at all." CSW educated daughter that she can either request an aide replacement from [facility] services or that they can choose a different aide agency altogether. CSW provided education on how both processes work. CSW questioned if she would like for CSW to contact [facility] today in order to discuss concerns in regards to why the aides are not staying for 2.5 hours that they are suppose to and to discuss the possibility of gaining a new aide. Daughter declined this stating "No please do not do that. My mother would be upset if I ended up losing her aides. I don't want to lose them." CSW informed her that all CSW would do is serve as an advocate for patient to ensure that she is gaining the services that she deserves. Daughter was very adamant that she does not want CSW to interfere with this process but at least is agreeable to contacting [facility] [employer] [facility] herself and speaking to them about her concerns. CSW sent  secure text message with contact number for [employer] per daughter's request so that she can contact them today to discuss her concerns. CSW questioned if it would be easier if CSW contacted [facility] and requested a change in aide agencies. Daughter declined this as well stating she would prefer to keep [facility] services and give them "another chance." CSW questioned if she would like for CSW to complete home visit in person to go over all of this information. Daughter declined stating that she has received the information that she needed from [place] and was very appreciative of phone call. CSW questioned if it would be okay to contact her next week to follow up and she was agreeable to this.  Plan-CSW will follow up within one week but will not open case unless daughter changes her mind.  Eula Fried, BSW, MSW, [email or web address] Phone: [phone] Fax: [phone]

## 2016-12-27 ENCOUNTER — Ambulatory Visit (INDEPENDENT_AMBULATORY_CARE_PROVIDER_SITE_OTHER): Payer: Medicare Other | Admitting: Internal Medicine

## 2016-12-27 ENCOUNTER — Other Ambulatory Visit: Payer: Self-pay

## 2016-12-27 ENCOUNTER — Other Ambulatory Visit: Payer: Self-pay | Admitting: Pharmacist

## 2016-12-27 ENCOUNTER — Encounter: Payer: Self-pay | Admitting: Internal Medicine

## 2016-12-27 ENCOUNTER — Other Ambulatory Visit: Payer: Self-pay | Admitting: Licensed Clinical Social Worker

## 2016-12-27 DIAGNOSIS — M79661 Pain in right lower leg: Secondary | ICD-10-CM | POA: Diagnosis not present

## 2016-12-27 DIAGNOSIS — M79662 Pain in left lower leg: Secondary | ICD-10-CM

## 2016-12-27 DIAGNOSIS — M545 Low back pain: Secondary | ICD-10-CM | POA: Diagnosis not present

## 2016-12-27 DIAGNOSIS — I10 Essential (primary) hypertension: Secondary | ICD-10-CM | POA: Diagnosis not present

## 2016-12-27 DIAGNOSIS — M25561 Pain in right knee: Secondary | ICD-10-CM | POA: Diagnosis not present

## 2016-12-27 DIAGNOSIS — G8929 Other chronic pain: Secondary | ICD-10-CM | POA: Diagnosis not present

## 2016-12-27 DIAGNOSIS — I951 Orthostatic hypotension: Secondary | ICD-10-CM | POA: Diagnosis not present

## 2016-12-27 DIAGNOSIS — M79604 Pain in right leg: Secondary | ICD-10-CM

## 2016-12-27 MED ORDER — METHYLPREDNISOLONE ACETATE 40 MG/ML IJ SUSP
40.0000 mg | Freq: Once | INTRAMUSCULAR | Status: AC
  Administered 2016-12-27: 40 mg via INTRA_ARTICULAR

## 2016-12-27 NOTE — Patient Outreach (Addendum)
[facility]) Care Management  12/27/2016  Kevin Mario 1933-06-18 [id number]   Called patient earlier this morning and spoke with her daughter. HIPAA identifiers were obtained.  Patient's daughter said she was in the car and said she would call me back when she got situated.    Patient's daughter did not call back and my call schedule was very busy.  Plan:  Call patient's daughter back in 5-7 business days.  Elayne Guerin, PharmD, [place] Clinical Pharmacist [phone]

## 2016-12-27 NOTE — Assessment & Plan Note (Addendum)
Tramadol prn  Potential benefits of a long term opioids use as well as potential risks (i.e. addiction risk, apnea etc) and complications (i.e. Somnolence, constipation and others) were explained to the patient and were aknowledged. Turmeric PT/OT

## 2016-12-27 NOTE — Assessment & Plan Note (Signed)
Indapamide po off

## 2016-12-27 NOTE — Assessment & Plan Note (Signed)
R>L  turmeric

## 2016-12-27 NOTE — Addendum Note (Signed)
Addended by: Karren Cobble on: 12/27/2016 01:10 PM   Modules accepted: Orders

## 2016-12-27 NOTE — Patient Outreach (Signed)
[facility]) Care Management  12/27/2016  Doneen Ollinger Sep 28, 1933 [id number]  Assessment- CSW completed outreach call to patient's daughter on 12/27/16. Daughter answered phone call and provided HIPPA verifications for patient. CSW questioned if daughter had the chance to contact [facility] to discuss her concerns about patient's aides. Daughter reports that she has not had the chance to contact [facility]. She reports that she had to take her father to a medical appointment and that he is going to have surgery on 12/30/16. CSW again questioned if daughter would like for CSW to contact [facility] [place] and serve as an advocate for her mother in order to ensure that patient receives the correct aide services that she is suppose to but daughter declined. However, daughter is agreeable to keep CSW's number and contact in the future in case things do not get any better and they decide to change PCS agencies.  Plan-CSW will update involved [facility] care team members and will not open case at this time.  Eula Fried, BSW, MSW, [email or web address] Phone: [phone] Fax: [phone]

## 2016-12-27 NOTE — Assessment & Plan Note (Signed)
Worse  Discussed  

## 2016-12-27 NOTE — Progress Notes (Signed)
Subjective:  Patient ID: Sabrina Page, female    DOB: April 26, 1934  Age: 83 y.o. MRN: [id number]  CC: No chief complaint on file.   HPI Sabrina Page presents for asthma, syncope, anxiety, OA and LBP f/u  Outpatient Medications Prior to Visit  Medication Sig Dispense Refill  . aspirin 81 MG EC tablet Take 1 tablet (81 mg total) by mouth 2 (two) times daily. 60 tablet 0  . B Complex-C (SUPER B COMPLEX PO) Take 1 tablet by mouth every morning.     . Cholecalciferol 1000 UNITS capsule Take 1,000 Units by mouth every morning.     . clonazePAM (KLONOPIN) 0.5 MG tablet Take 1 tablet (0.5 mg total) by mouth 2 (two) times daily as needed for anxiety. 60 tablet 2  . FLUoxetine (PROZAC) 40 MG capsule Take 1 capsule (40 mg total) by mouth daily. 90 capsule 3  . KLOR-CON 8 MEQ tablet TAKE 1 TABLET EVERY MORNING 30 tablet 5  . loratadine (CLARITIN) 10 MG tablet Take 1 tablet (10 mg total) by mouth every morning. 100 tablet 3  . midodrine (PROAMATINE) 10 MG tablet TAKE 1 TABLET (10 MG TOTAL) BY MOUTH 3 (THREE) TIMES DAILY. 90 tablet 5  . Omega-3 Fatty Acids (FISH OIL) 1000 MG CAPS Take 1 capsule by mouth every morning.     Marland Kitchen omeprazole (PRILOSEC) 20 MG capsule Take 2 capsules (40 mg total) by mouth daily. 180 capsule 3  . pyridostigmine (MESTINON) 60 MG tablet Take 0.5 tablets (30 mg total) by mouth 3 (three) times daily. 90 tablet 5  . traMADol (ULTRAM) 50 MG tablet TAKE 1/2 TO 1 TABLET BY MOUTH EVERY 6 HOURS AS NEEDED 120 tablet 2  . albuterol (PROVENTIL) (2.5 MG/3ML) 0.083% nebulizer solution Take 3 mLs (2.5 mg total) by nebulization every 6 (six) hours as needed for wheezing or shortness of breath. (Patient not taking: Reported on 09/23/2016) 150 mL 1  . ferrous sulfate 325 (65 FE) MG tablet Take 1 tablet (325 mg total) by mouth 3 (three) times daily after meals. 30 tablet 0  . mometasone-formoterol (DULERA) 200-5 MCG/ACT AERO Inhale 2 puffs into the lungs 2 (two) times daily. (Patient  not taking: Reported on 09/23/2016) 1 Inhaler 5   No facility-administered medications prior to visit.     ROS Review of Systems  Constitutional: Positive for fatigue. Negative for activity change, appetite change, chills and unexpected weight change.  HENT: Negative for congestion, mouth sores and sinus pressure.   Eyes: Negative for visual disturbance.  Respiratory: Positive for cough. Negative for chest tightness.   Gastrointestinal: Negative for abdominal pain and nausea.  Genitourinary: Negative for difficulty urinating, frequency and vaginal pain.  Musculoskeletal: Positive for arthralgias, back pain, gait problem and myalgias.  Skin: Negative for pallor and rash.  Neurological: Positive for weakness and light-headedness. Negative for dizziness, tremors, numbness and headaches.  Psychiatric/Behavioral: Positive for decreased concentration and dysphoric mood. Negative for confusion, sleep disturbance and suicidal ideas. The patient is nervous/anxious.     Objective:  BP 130/78 (BP Location: Left Arm, Patient Position: Sitting, Cuff Size: Large)   Pulse 82   Temp 98.5 F (36.9 C) (Oral)   Ht 5\' 4"  (1.626 m)   Wt 216 lb (98 kg)   SpO2 98%   BMI 37.08 kg/m   BP Readings from Last 3 Encounters:  12/27/16 130/78  09/23/16 138/82  08/16/16 100/70    Wt Readings from Last 3 Encounters:  12/27/16 216 lb (98 kg)  09/23/16 210  lb (95.3 kg)  08/16/16 211 lb 8 oz (95.9 kg)    Physical Exam  Constitutional: She appears well-developed. No distress.  HENT:  Head: Normocephalic.  Right Ear: External ear normal.  Left Ear: External ear normal.  Nose: Nose normal.  Mouth/Throat: Oropharynx is clear and moist.  Eyes: Pupils are equal, round, and reactive to light. Conjunctivae are normal. Right eye exhibits no discharge. Left eye exhibits no discharge.  Neck: Normal range of motion. Neck supple. No JVD present. No tracheal deviation present. No thyromegaly present.    Cardiovascular: Normal rate, regular rhythm and normal heart sounds.   Pulmonary/Chest: No stridor. No respiratory distress. She has no wheezes.  Abdominal: Soft. Bowel sounds are normal. She exhibits no distension and no mass. There is no tenderness. There is no rebound and no guarding.  Musculoskeletal: She exhibits tenderness. She exhibits no edema.  Lymphadenopathy:    She has no cervical adenopathy.  Neurological: She displays normal reflexes. No cranial nerve deficit. She exhibits normal muscle tone. Coordination normal.  Skin: No rash noted. No erythema.  Psychiatric: She has a normal mood and affect. Her behavior is normal. Judgment and thought content normal.  Obese Knees - B tender R b anserina is tender LS tender Walker    Procedure Note :    Procedure :Joint Injection,  Knee bursa anserina R   Indication: Bursitis with refractory  chronic pain.   Risks including unsuccessful procedure , bleeding, infection, bruising, skin atrophy and others were explained to the patient in detail as well as the benefits. Informed consent was obtained and signed.   Tthe patient was placed in a comfortable position. Skin was prepped with Betadine and alcohol  and anesthetized with a cooling spray. Then, a 3 cc syringe with a 1.5 inch long 25-gauge needle was used for a bursa injection in a fan-like fasion with 3 mL of 2% lidocaine and 40 mg of Depo-Medrol .  Band-Aid was applied.   Tolerated well. Complications: None. Good pain relief following the procedure.    Lab Results  Component Value Date   WBC 6.5 04/20/2016   HGB 10.9 (L) 04/20/2016   HCT 33.9 (L) 04/20/2016   PLT 177 04/20/2016   GLUCOSE 94 09/23/2016   CHOL 270 (H) 06/12/2014   TRIG 132.0 06/12/2014   HDL 52.40 06/12/2014   LDLDIRECT 159.1 05/16/2012   LDLCALC 191 (H) 06/12/2014   ALT 12 09/23/2016   AST 12 09/23/2016   NA 141 09/23/2016   K 4.5 09/23/2016   CL 103 09/23/2016   CREATININE 1.04 09/23/2016   BUN 18  09/23/2016   CO2 34 (H) 09/23/2016   TSH 1.71 09/23/2016   INR 1.07 04/16/2016   HGBA1C 5.6 12/25/2015    Dg Chest 1 View  Result Date: 04/15/2016 CLINICAL DATA:  Pain after trauma EXAM: CHEST 1 VIEW COMPARISON:  January 18, 2016 FINDINGS: The heart size is more prominent the interval, probably due to the portable technique. The mediastinum is also more prominent. Increased interstitial prominence without overt edema. No focal infiltrate. No other acute abnormalities. IMPRESSION: The heart appears more prominent as does the mediastinum. This very well may be due to the low volume portable technique. Recommend a PA and lateral chest x-ray before discharge. Increased interstitial prominence could represent pulmonary venous congestion versus technical differences as well. No overt edema. Electronically Signed   By: Dorise Bullion III M.D   On: 04/15/2016 17:16   Dg Pelvis 1-2 Views  Result Date: 04/15/2016  CLINICAL DATA:  Patient fell out of wheelchair. EXAM: PELVIS - 1-2 VIEW COMPARISON:  None. FINDINGS: There is a right hip fracture. IMPRESSION: There is a right hip fracture incompletely evaluated. Electronically Signed   By: Dorise Bullion III M.D   On: 04/15/2016 17:14   Dg Femur Min 2 Views Right  Result Date: 04/15/2016 CLINICAL DATA:  Pain after trauma EXAM: RIGHT FEMUR 2 VIEWS COMPARISON:  None FINDINGS: There is a subcapital right hip fracture with no dislocation. No other abnormalities. IMPRESSION: There is a subcapital right hip fracture with no dislocation. Electronically Signed   By: Dorise Bullion III M.D   On: 04/15/2016 17:15    Assessment & Plan:   There are no diagnoses linked to this encounter. I have discontinued Ms. Andrus ferrous sulfate, albuterol, and mometasone-formoterol. I am also having her maintain her Fish Oil, Cholecalciferol, B Complex-C (SUPER B COMPLEX PO), FLUoxetine, omeprazole, traMADol, loratadine, aspirin, pyridostigmine, midodrine, KLOR-CON,  and clonazePAM.  No orders of the defined types were placed in this encounter.    Follow-up: No Follow-up on file.  [name], MD

## 2016-12-27 NOTE — Patient Instructions (Addendum)
Turmeric for pain   Postprocedure instructions :    A Band-Aid should be left on for 12 hours. Injection therapy is not a cure itself. It is used in conjunction with other modalities. You can use nonsteroidal anti-inflammatories like ibuprofen , hot and cold compresses. Rest is recommended in the next 24 hours. You need to report immediately  if fever, chills or any signs of infection develop.

## 2016-12-27 NOTE — Assessment & Plan Note (Signed)
Midodin

## 2016-12-27 NOTE — Assessment & Plan Note (Signed)
turmeric

## 2016-12-29 NOTE — Patient Outreach (Signed)
    This RNCM wsa successful in making telephone contact with patient's daughter, [name],  who advised this RNCM she did not have long to talk. Patient's daughter stated she is a PT and was getting ready to go into a patient's home and would call back later.  Plan: Complete telephone assessment with daughter who is patient's authorized spokesperson upon her return.

## 2016-12-30 ENCOUNTER — Other Ambulatory Visit: Payer: Self-pay

## 2016-12-30 NOTE — Patient Outreach (Signed)
[name]) Care Management  12/30/16  Sabrina Page 1933-11-08 [id number]  Successful outreach completed with patient's daughter, Sabrina Page. Patient identification verified. RNCM was only able to speak with patient's daughter for a few moments as she was driving her father to a doctor's appointment.  Patient's daughter expressed concerns regarding patient's blood pressure. She stated that patient is at risk of falling because she has been having a lot of episodes of hypotension. She stated that they are currently holding her blood pressure medications until her blood pressure returns to normal. She is concerned because she does not think her mother is checking her blood pressure accurately and she would like for someone else to come in and assess her and check on her. She stated she is busy with her job and cannot always be there to check on her.   She stated that her mother has a walker in the home, but does not use it. She stated she would like a falls assessment and education about safety provided to her mother. She stated that she has tried to tell her, but she feels it would be better received coming from another professional.   RNCM advised that Erenest Rasher, is her primary coverage and she would be performing a home visit to complete the assessment. She requested that the visit be coordinated with her at [phone] so that she can make sure she can be present for the home visit as well. RNCM advised Olin Hauser would give her a call sometime next week and arrange the home visit and she stated that she would not be able to do the visit next week and prefers it to be scheduled for the following week. RNCM to pass this information along to primary nurse and patient's daughter was agreeable.  She currently does not have any urgent needs. Encouraged to call with any questions and concerns and contact information provided.  [place] R. Satterfield, RN, BSN, [place] Management Coordinator [phone]

## 2017-01-03 ENCOUNTER — Other Ambulatory Visit: Payer: Self-pay | Admitting: Internal Medicine

## 2017-01-03 DIAGNOSIS — M1991 Primary osteoarthritis, unspecified site: Secondary | ICD-10-CM | POA: Diagnosis not present

## 2017-01-03 DIAGNOSIS — M79662 Pain in left lower leg: Secondary | ICD-10-CM | POA: Diagnosis not present

## 2017-01-03 DIAGNOSIS — M79661 Pain in right lower leg: Secondary | ICD-10-CM | POA: Diagnosis not present

## 2017-01-03 DIAGNOSIS — J45909 Unspecified asthma, uncomplicated: Secondary | ICD-10-CM | POA: Diagnosis not present

## 2017-01-03 DIAGNOSIS — G8929 Other chronic pain: Secondary | ICD-10-CM | POA: Diagnosis not present

## 2017-01-03 DIAGNOSIS — M545 Low back pain: Secondary | ICD-10-CM | POA: Diagnosis not present

## 2017-01-03 DIAGNOSIS — M7051 Other bursitis of knee, right knee: Secondary | ICD-10-CM | POA: Diagnosis not present

## 2017-01-03 DIAGNOSIS — I951 Orthostatic hypotension: Secondary | ICD-10-CM | POA: Diagnosis not present

## 2017-01-03 DIAGNOSIS — Z6841 Body Mass Index (BMI) 40.0 and over, adult: Secondary | ICD-10-CM | POA: Diagnosis not present

## 2017-01-04 ENCOUNTER — Other Ambulatory Visit: Payer: Self-pay | Admitting: Pharmacist

## 2017-01-04 NOTE — Telephone Encounter (Signed)
Patient is requesting Tramadol, last filled 2/6 with 2 re-fills.  Patient last seen by you on 12/27/16.  Please advise.  Thanks, [employer]

## 2017-01-04 NOTE — Patient Outreach (Signed)
[facility]) Care Management  01/04/2017  Sabrina Page 1934-04-19 [id number]   Called patient earlier this morning and spoke with her daughter. HIPAA identifiers were obtained.  Patient's daughter said she was with a patient and that she would call me back later.   Plan:  Await a call back from the patient's daughter If I do not hear back, call patient back in 7-10 business days.  Elayne Guerin, PharmD, [place] Clinical Pharmacist [phone]

## 2017-01-05 ENCOUNTER — Telehealth: Payer: Self-pay | Admitting: Internal Medicine

## 2017-01-05 DIAGNOSIS — M79661 Pain in right lower leg: Secondary | ICD-10-CM | POA: Diagnosis not present

## 2017-01-05 DIAGNOSIS — M545 Low back pain: Secondary | ICD-10-CM | POA: Diagnosis not present

## 2017-01-05 DIAGNOSIS — J45909 Unspecified asthma, uncomplicated: Secondary | ICD-10-CM | POA: Diagnosis not present

## 2017-01-05 DIAGNOSIS — M7051 Other bursitis of knee, right knee: Secondary | ICD-10-CM | POA: Diagnosis not present

## 2017-01-05 DIAGNOSIS — G8929 Other chronic pain: Secondary | ICD-10-CM | POA: Diagnosis not present

## 2017-01-05 DIAGNOSIS — M79662 Pain in left lower leg: Secondary | ICD-10-CM | POA: Diagnosis not present

## 2017-01-05 NOTE — Telephone Encounter (Signed)
[facility]  [phone]  Need verbal for OT  1 week 1 2 week 3 1 week1

## 2017-01-06 NOTE — Telephone Encounter (Signed)
Ok Thx 

## 2017-01-07 NOTE — Telephone Encounter (Signed)
Called [facility] no answer LMOM w/MD response...[name]

## 2017-01-07 NOTE — Telephone Encounter (Signed)
Check [place] registry their are no history for refills.../lm,b

## 2017-01-08 NOTE — Telephone Encounter (Signed)
OK to fill this/these prescription(s) with additional refills x2 Thank you!  

## 2017-01-09 ENCOUNTER — Encounter (HOSPITAL_COMMUNITY): Payer: Self-pay | Admitting: Family Medicine

## 2017-01-09 ENCOUNTER — Ambulatory Visit (HOSPITAL_COMMUNITY)
Admission: EM | Admit: 2017-01-09 | Discharge: 2017-01-09 | Disposition: A | Discharge status: Home / Self Care | Payer: Medicare Other | Attending: Family Medicine | Admitting: Family Medicine

## 2017-01-09 DIAGNOSIS — W19XXXA Unspecified fall, initial encounter: Secondary | ICD-10-CM | POA: Diagnosis not present

## 2017-01-09 DIAGNOSIS — L03032 Cellulitis of left toe: Secondary | ICD-10-CM | POA: Diagnosis not present

## 2017-01-09 DIAGNOSIS — R21 Rash and other nonspecific skin eruption: Secondary | ICD-10-CM

## 2017-01-09 MED ORDER — DOXYCYCLINE HYCLATE 100 MG PO TABS: 100.0000 mg | ORAL_TABLET | Freq: Two times a day (BID) | ORAL | 0 refills | Status: DC

## 2017-01-09 NOTE — ED Provider Notes (Signed)
[place]    CSN: [id number] Arrival date & time: 01/09/17  1203     History   Chief Complaint Chief Complaint  Patient presents with  . Fall    HPI Jim Downum is a 83 y.o. female.   This is an 83 year old woman comes from [place] and fell 2 days ago in her apartment on [address]. She has recently had a right hip operation and is in rehabilitation.  Patient daughter notes increasing redness in the left great toe following the fall. Patient does not complain of pain in the knee or foot or toe. They're concerned that the compression stocking that she was wearing resulted in the cellulitis that they are now seen.      Past Medical History:  Diagnosis Date  . Allergy    rhinitis  . Asthma   . Chronic kidney disease    hx of cystitis   . COPD (chronic obstructive pulmonary disease) ([place])   . Depression   . Dizziness    vertigo   . Gallstones   . GERD (gastroesophageal reflux disease)   . Hyperlipidemia   . Hypertension   . Obesity   . Osteoarthritis   . Osteoporosis   . Peripheral vascular disease (H[facility])    swelling   . Pneumonia   . Shortness of breath    with exertion   . Shoulder injury    left  . Sleep apnea    never diagnosed   . Urinary incontinence     Patient Active Problem List   Diagnosis Date Noted  . Knee pain, right 12/27/2016  . Fall   . Closed right hip fracture ([place]) 04/15/2016  . Hip fracture ([place]) 04/15/2016  . Syncope due to orthostatic hypotension 01/18/2016  . Orthostatic hypotension 01/18/2016  . Hip pain, chronic 12/25/2015  . Carotid bruit 06/26/2015  . Fall at home 02/01/2015  . Concussion without loss of consciousness 09/13/2014  . Morbid obesity ([place]) 03/06/2014  . Right tennis elbow 01/02/2014  . Wart 01/02/2014  . Chronic fatigue 06/25/2013  . External hemorrhoid, bleeding 02/14/2013  . OSA on CPAP 02/10/2012  . Low back pain radiating to both legs 05/14/2011  . Falls frequently  05/14/2011  . Fatigue 04/22/2011  . Hyperglycemia 04/22/2011  . Neoplasm of uncertain behavior of skin 01/11/2011  . Anxiety state 07/06/2010  . SHOULDER PAIN 05/21/2010  . CONCUSSION WITH LOSS OF CONSCIOUSNESS 05/21/2010  . Actinic keratosis 04/08/2010  . CARPAL TUNNEL SYNDROME 11/28/2009  . NECK PAIN 11/28/2009  . DIZZINESS 11/28/2009  . INSOMNIA, CHRONIC 06/23/2009  . TOBACCO USE, QUIT 04/11/2009  . DIVERTICULOSIS OF COLON 09/06/2008  . DIVERTICULITIS OF COLON 09/06/2008  . Unspecified chest pain 09/06/2008  . GERD 06/05/2008  . OSTEOARTHRITIS 06/05/2008  . ABNORMAL GLUCOSE NEC 06/05/2008  . BRONCHITIS, ACUTE 04/04/2008  . APHTHOUS STOMATITIS 03/25/2008  . Venous (peripheral) insufficiency 11/03/2007  . Edema 11/03/2007  . SHINGLES 10/23/2007  . DENTAL PAIN 10/23/2007  . PARESTHESIA 10/23/2007  . Pain in limb 07/06/2007  . HYPERLIPIDEMIA 02/17/2007  . Depression 02/17/2007  . Essential hypertension 02/17/2007  . ALLERGIC RHINITIS 02/17/2007  . Asthma, moderate persistent, well-controlled 02/17/2007  . Dyspnea on exertion 02/17/2007  . SYMPTOM, INCONTINENCE, URGE 02/17/2007    Past Surgical History:  Procedure Laterality Date  . CHOLECYSTECTOMY  09/14/2011   Procedure: LAPAROSCOPIC CHOLECYSTECTOMY;  Surgeon: Odis Hollingshead, MD;  Location: [facility];  Service: General;  Laterality: N/A;  attempted intraoperative cholangiogram   . HIP ARTHROPLASTY  Right 04/17/2016   Procedure: ARTHROPLASTY BIPOLAR HIP (HEMIARTHROPLASTY);  Surgeon: Paralee Cancel, MD;  Location: [facility];  Service: Orthopedics;  Laterality: Right;  . L TKR    . LIVER BIOPSY  09/14/2011   Procedure: LIVER BIOPSY;  Surgeon: Odis Hollingshead, MD;  Location: [facility];  Service: General;  Laterality: N/A;  . OTHER SURGICAL HISTORY     left leg surgery   . OTHER SURGICAL HISTORY     left breast surgery due to mastitis   . right left knee arthroplast      OB History    No data available       Home  Medications    Prior to Admission medications   Medication Sig Start Date End Date Taking? Authorizing Provider  aspirin 81 MG EC tablet Take 1 tablet (81 mg total) by mouth 2 (two) times daily. 07/19/16   Plotnikov, Evie Lacks, MD  B Complex-C (SUPER B COMPLEX PO) Take 1 tablet by mouth every morning.     [provider]  Cholecalciferol 1000 UNITS capsule Take 1,000 Units by mouth every morning.     [provider]  clonazePAM (KLONOPIN) 0.5 MG tablet Take 1 tablet (0.5 mg total) by mouth 2 (two) times daily as needed for anxiety. 09/20/16   Plotnikov, Evie Lacks, MD  doxycycline (VIBRA-TABS) 100 MG tablet Take 1 tablet (100 mg total) by mouth 2 (two) times daily. 01/09/17   Robyn Haber, MD  FLUoxetine (PROZAC) 40 MG capsule Take 1 capsule (40 mg total) by mouth daily. 06/29/16   Plotnikov, Evie Lacks, MD  KLOR-CON 8 MEQ tablet TAKE 1 TABLET EVERY MORNING 08/24/16   Plotnikov, Evie Lacks, MD  loratadine (CLARITIN) 10 MG tablet Take 1 tablet (10 mg total) by mouth every morning. 06/29/16   Plotnikov, Evie Lacks, MD  midodrine (PROAMATINE) 10 MG tablet TAKE 1 TABLET (10 MG TOTAL) BY MOUTH 3 (THREE) TIMES DAILY. 08/24/16   Plotnikov, Evie Lacks, MD  Omega-3 Fatty Acids (FISH OIL) 1000 MG CAPS Take 1 capsule by mouth every morning.     [provider]  omeprazole (PRILOSEC) 20 MG capsule Take 2 capsules (40 mg total) by mouth daily. 06/29/16   Plotnikov, Evie Lacks, MD  pyridostigmine (MESTINON) 60 MG tablet Take 0.5 tablets (30 mg total) by mouth 3 (three) times daily. 08/16/16   Patel, Arvin Collard K, DO  traMADol (ULTRAM) 50 MG tablet TAKE 1/2 TO 1 TABLET BY MOUTH EVERY 6 HOURS AS NEEDED 06/29/16   Plotnikov, Evie Lacks, MD    Family History Family History  Problem Relation Age of Onset  . Hypertension Mother   . Arthritis Mother   . Diabetes Mother   . Stroke Mother   . Stroke Father   . Hypertension Father   . Hypertension Other   . CAD Neg Hx     Social History Social History    Substance Use Topics  . Smoking status: Former Smoker    Packs/day: 0.50    Years: 35.00    Types: Cigarettes    Quit date: 05/24/2001  . Smokeless tobacco: Never Used  . Alcohol use Yes     Comment: socially      Allergies   Atorvastatin; Enalapril maleate; Hctz [hydrochlorothiazide]; Lovastatin; Simvastatin; and Topamax [topiramate]   Review of Systems Review of Systems  Musculoskeletal: Positive for gait problem.  Skin: Positive for rash.  All other systems reviewed and are negative.    Physical Exam Triage Vital Signs ED Triage Vitals  Enc Vitals Group     BP 01/09/17 1237 (!) 163/83     Pulse Rate 01/09/17 1237 74     Resp 01/09/17 1237 20     Temp 01/09/17 1237 98.6 F (37 C)     Temp Source 01/09/17 1237 Oral     SpO2 01/09/17 1237 95 %     Weight --      Height --      Head Circumference --      Peak Flow --      Pain Score 01/09/17 1234 5     Pain Loc --      Pain Edu? --      Excl. in [place]? --    No data found.   Updated Vital Signs BP (!) 163/83 (BP Location: Right Arm)   Pulse 74   Temp 98.6 F (37 C) (Oral)   Resp 20   SpO2 95%    Physical Exam  Constitutional: She is oriented to person, place, and time. She appears well-developed and well-nourished.  HENT:  Right Ear: External ear normal.  Left Ear: External ear normal.  Mouth/Throat: Oropharynx is clear and moist.  Eyes: Pupils are equal, round, and reactive to light. Conjunctivae are normal.  Neck: Normal range of motion. Neck supple.  Pulmonary/Chest: Effort normal.  Musculoskeletal: Normal range of motion. She exhibits no deformity.  Patient is a cane to walk around the office without difficulty. There is no obvious pain when she is walking.  She does have an ecchymosis measuring about 1/2 cm over the medial right proximal tibia.  The left toe is diffusely reddened but the skin is intact and there is no tenderness.  Neurological: She is alert and oriented to person, place, and  time.  Skin: Skin is warm and dry. There is erythema.  Nursing note and vitals reviewed.    UC Treatments / Results  Labs (all labs ordered are listed, but only abnormal results are displayed) Labs Reviewed - No data to display  EKG  EKG Interpretation None       Radiology No results found.  Procedures Procedures (including critical care time)  Medications Ordered in UC Medications - No data to display   Initial Impression / Assessment and Plan / UC Course  I have reviewed the triage vital signs and the nursing notes.  Pertinent labs & imaging results that were available during my care of the patient were reviewed by me and considered in my medical decision making (see chart for details).     Final Clinical Impressions(s) / UC Diagnoses   Final diagnoses:  Cellulitis of left toe    New Prescriptions New Prescriptions   DOXYCYCLINE (VIBRA-TABS) 100 MG TABLET    Take 1 tablet (100 mg total) by mouth 2 (two) times daily.     Controlled Substance Prescriptions [place] Controlled Substance Registry consulted? Not Applicable   Robyn Haber, MD 01/09/17 1254

## 2017-01-09 NOTE — ED Triage Notes (Addendum)
Fell from bed on friday at 4:00 am.  Left and right knee pain  Left foot at toes is bruised.  Daughter reports she wears compression stockings and there is left great toe with blisters and redness.  Patient falls frequently.  Patient usually uses a walker to ambulate, but sometimes chooses to use nothing.  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    Left foot has a 1+ pulse.

## 2017-01-10 NOTE — Telephone Encounter (Signed)
Called refill into [facility] had to leave on pharmacy vm../lmb 

## 2017-01-12 DIAGNOSIS — G8929 Other chronic pain: Secondary | ICD-10-CM | POA: Diagnosis not present

## 2017-01-12 DIAGNOSIS — M7051 Other bursitis of knee, right knee: Secondary | ICD-10-CM | POA: Diagnosis not present

## 2017-01-12 DIAGNOSIS — M545 Low back pain: Secondary | ICD-10-CM | POA: Diagnosis not present

## 2017-01-12 DIAGNOSIS — M79662 Pain in left lower leg: Secondary | ICD-10-CM | POA: Diagnosis not present

## 2017-01-12 DIAGNOSIS — J45909 Unspecified asthma, uncomplicated: Secondary | ICD-10-CM | POA: Diagnosis not present

## 2017-01-12 DIAGNOSIS — M79661 Pain in right lower leg: Secondary | ICD-10-CM | POA: Diagnosis not present

## 2017-01-13 ENCOUNTER — Other Ambulatory Visit: Payer: Self-pay | Admitting: Pharmacist

## 2017-01-13 NOTE — Patient Outreach (Signed)
[place] [facility]) Care Management  01/13/2017  Sabrina Page [birth year] [id number]   Called patient's daughter per referral. HIPAA identifiers were obtained. Patient's daughter was in the car on the way to run an errand. She could not review her mother's medications with me as she was driving. She said "whatever medications are on the list in your system is what she is taking".    When asked if she had any medication questions or concerns, patient's daughter said she did not have any questions and the patient is/was not having any medication concerns.  Plan:  Close pharmacy case as the patient's daughter does not have any medication concerns and has been very difficult to meaningfully contact.  Elayne Guerin, PharmD, [place] Clinical Pharmacist [phone]

## 2017-01-14 DIAGNOSIS — M7051 Other bursitis of knee, right knee: Secondary | ICD-10-CM | POA: Diagnosis not present

## 2017-01-14 DIAGNOSIS — M545 Low back pain: Secondary | ICD-10-CM | POA: Diagnosis not present

## 2017-01-14 DIAGNOSIS — J45909 Unspecified asthma, uncomplicated: Secondary | ICD-10-CM | POA: Diagnosis not present

## 2017-01-14 DIAGNOSIS — M79662 Pain in left lower leg: Secondary | ICD-10-CM | POA: Diagnosis not present

## 2017-01-14 DIAGNOSIS — G8929 Other chronic pain: Secondary | ICD-10-CM | POA: Diagnosis not present

## 2017-01-14 DIAGNOSIS — M79661 Pain in right lower leg: Secondary | ICD-10-CM | POA: Diagnosis not present

## 2017-01-17 ENCOUNTER — Other Ambulatory Visit: Payer: Self-pay

## 2017-01-17 DIAGNOSIS — J45909 Unspecified asthma, uncomplicated: Secondary | ICD-10-CM | POA: Diagnosis not present

## 2017-01-17 DIAGNOSIS — M79661 Pain in right lower leg: Secondary | ICD-10-CM | POA: Diagnosis not present

## 2017-01-17 DIAGNOSIS — M545 Low back pain: Secondary | ICD-10-CM | POA: Diagnosis not present

## 2017-01-17 DIAGNOSIS — G8929 Other chronic pain: Secondary | ICD-10-CM | POA: Diagnosis not present

## 2017-01-17 DIAGNOSIS — M79662 Pain in left lower leg: Secondary | ICD-10-CM | POA: Diagnosis not present

## 2017-01-17 DIAGNOSIS — M7051 Other bursitis of knee, right knee: Secondary | ICD-10-CM | POA: Diagnosis not present

## 2017-01-17 NOTE — Patient Outreach (Signed)
[place] [facility]) Care Management  01/17/2017   Sabrina Page May 22, 1934 [id number]  Subjective:  This RNCM spoke with patient's daughter who reports her mother has had several falls in the last 12 months  Objective:  Patient is 83 year old with history of falls with injury and long history of chronic illnesses.  Patient lives independently in senior apartment with her husband, daughter is the primary caregiver.   Current Medications:  Current Outpatient Prescriptions  Medication Sig Dispense Refill  . aspirin 81 MG EC tablet Take 1 tablet (81 mg total) by mouth 2 (two) times daily. 60 tablet 0  . B Complex-C (SUPER B COMPLEX PO) Take 1 tablet by mouth every morning.     . Cholecalciferol 1000 UNITS capsule Take 1,000 Units by mouth every morning.     . clonazePAM (KLONOPIN) 0.5 MG tablet Take 1 tablet (0.5 mg total) by mouth 2 (two) times daily as needed for anxiety. 60 tablet 2  . doxycycline (VIBRA-TABS) 100 MG tablet Take 1 tablet (100 mg total) by mouth 2 (two) times daily. 20 tablet 0  . FLUoxetine (PROZAC) 40 MG capsule Take 1 capsule (40 mg total) by mouth daily. 90 capsule 3  . KLOR-CON 8 MEQ tablet TAKE 1 TABLET EVERY MORNING 30 tablet 5  . loratadine (CLARITIN) 10 MG tablet Take 1 tablet (10 mg total) by mouth every morning. 100 tablet 3  . midodrine (PROAMATINE) 10 MG tablet TAKE 1 TABLET (10 MG TOTAL) BY MOUTH 3 (THREE) TIMES DAILY. 90 tablet 5  . Omega-3 Fatty Acids (FISH OIL) 1000 MG CAPS Take 1 capsule by mouth every morning.     Marland Kitchen omeprazole (PRILOSEC) 20 MG capsule Take 2 capsules (40 mg total) by mouth daily. 180 capsule 3  . pyridostigmine (MESTINON) 60 MG tablet Take 0.5 tablets (30 mg total) by mouth 3 (three) times daily. 90 tablet 5  . traMADol (ULTRAM) 50 MG tablet TAKE 1/2 TO 1 TABLET BY MOUTH EVERY 6 HOURS AS NEEDED FOR PAIN 120 tablet 1   No current facility-administered medications for this visit.     Functional Status:  In your  present state of health, do you have any difficulty performing the following activities: 01/17/2017 04/15/2016  Hearing? N N  Vision? Y N  Difficulty concentrating or making decisions? Y N  Walking or climbing stairs? Y Y  Dressing or bathing? Y Y  Doing errands, shopping? [place] Don[place]  Preparing Food and eating ? N -  Using the Toilet? N -  In the past six months, have you accidently leaked urine? Y -  Do you have problems with loss of bowel control? N -  Managing your Medications? N -  Managing your Finances? N -  Housekeeping or managing your Housekeeping? Y -  Some recent data might be hidden    Fall/Depression Screening: Fall Risk  01/17/2017 12/20/2016 08/16/2016  Falls in the past year? Yes Yes Yes  Number falls in past yr: 2 or more 2 or more 2 or more  Injury with Fall? Yes Yes Yes  Risk Factor Category  High Fall Risk High Fall Risk High Fall Risk  Risk for fall due to : History of fall(s);Impaired balance/gait;Impaired mobility;Impaired vision;Medication side effect Impaired balance/gait;History of fall(s);Impaired mobility;Medication side effect;Mental status change;Impaired vision History of fall(s);Impaired balance/gait  Follow up Follow up appointment Falls evaluation completed;Education provided;Falls prevention discussed Falls evaluation completed;Education provided;Falls prevention discussed   PHQ 2/9 Scores 01/17/2017 12/20/2016 02/10/2016  PHQ - 2 Score 0 -  2  Exception Documentation - Other- indicate reason in comment box -  Not completed - call completed with dtr -   Fall Risk  01/17/2017 12/20/2016 08/16/2016 02/17/2016 02/10/2016  Falls in the past year? Yes Yes Yes Yes Yes  Number falls in past yr: 2 or more 2 or more 2 or more 2 or more 2 or more  Injury with Fall? Yes Yes Yes No Yes  Risk Factor Category  High Fall Risk High Fall Risk High Fall Risk High Fall Risk -  Risk for fall due to : History of fall(s);Impaired balance/gait;Impaired mobility;Impaired vision;Medication  side effect Impaired balance/gait;History of fall(s);Impaired mobility;Medication side effect;Mental status change;Impaired vision History of fall(s);Impaired balance/gait Impaired balance/gait;Impaired mobility;Other (Comment) -  Follow up Follow up appointment Falls evaluation completed;Education provided;Falls prevention discussed Falls evaluation completed;Education provided;Falls prevention discussed Falls evaluation completed;Education provided;Falls prevention discussed -   [facility] CM Care Plan Problem One     Most Recent Value  Care Plan Problem One  patient has history of falls with injury  Role Documenting the Problem One  Care Management [place] for Problem One  Active  [facility] Long Term Goal   In the next 31 days, patient will have no more than one fall  [facility] Long Term Goal Start Date  01/17/17  Interventions for Problem One Long Term Goal  8/27 telephonic assessment of community care needs, scheduled home visit for fall prevention education  [facility] CM Short Term Goal #1   In the next 28 days, patient will meet with this [place] for fall prevention education  [facility] CM Short Term Goal #1 Start Date  01/17/17  Interventions for Short Term Goal #1  8/27 patient has had several falls with injuries in the las t12 month per her daughter's report      Assessment:  Initial assessment of community care needs for this patient who was referred for chronic disease education, assessment of community care coordination needs. Patient is currently receiving HHPT/OT. Patient agreed to home visit later this month for Fall Prevention Education, assessment of community care coordination needs.  Plan:  Home visit in the next 28 days for community care coordination.

## 2017-01-19 ENCOUNTER — Other Ambulatory Visit: Payer: Self-pay

## 2017-01-19 DIAGNOSIS — M7051 Other bursitis of knee, right knee: Secondary | ICD-10-CM | POA: Diagnosis not present

## 2017-01-19 DIAGNOSIS — J45909 Unspecified asthma, uncomplicated: Secondary | ICD-10-CM | POA: Diagnosis not present

## 2017-01-19 DIAGNOSIS — G8929 Other chronic pain: Secondary | ICD-10-CM | POA: Diagnosis not present

## 2017-01-19 DIAGNOSIS — M79662 Pain in left lower leg: Secondary | ICD-10-CM | POA: Diagnosis not present

## 2017-01-19 DIAGNOSIS — M79661 Pain in right lower leg: Secondary | ICD-10-CM | POA: Diagnosis not present

## 2017-01-19 DIAGNOSIS — M545 Low back pain: Secondary | ICD-10-CM | POA: Diagnosis not present

## 2017-01-21 DIAGNOSIS — M79662 Pain in left lower leg: Secondary | ICD-10-CM | POA: Diagnosis not present

## 2017-01-21 DIAGNOSIS — M545 Low back pain: Secondary | ICD-10-CM | POA: Diagnosis not present

## 2017-01-21 DIAGNOSIS — M79661 Pain in right lower leg: Secondary | ICD-10-CM | POA: Diagnosis not present

## 2017-01-21 DIAGNOSIS — M7051 Other bursitis of knee, right knee: Secondary | ICD-10-CM | POA: Diagnosis not present

## 2017-01-21 DIAGNOSIS — J45909 Unspecified asthma, uncomplicated: Secondary | ICD-10-CM | POA: Diagnosis not present

## 2017-01-21 DIAGNOSIS — G8929 Other chronic pain: Secondary | ICD-10-CM | POA: Diagnosis not present

## 2017-01-23 IMAGING — CR DG CHEST 2V
2 series · 2 of 2 positions shown · non-contrast
Comparison: 09/06/2011

CLINICAL DATA: Near syncope.  COPD.

EXAM:
CHEST  2 VIEW

[w chest pa]
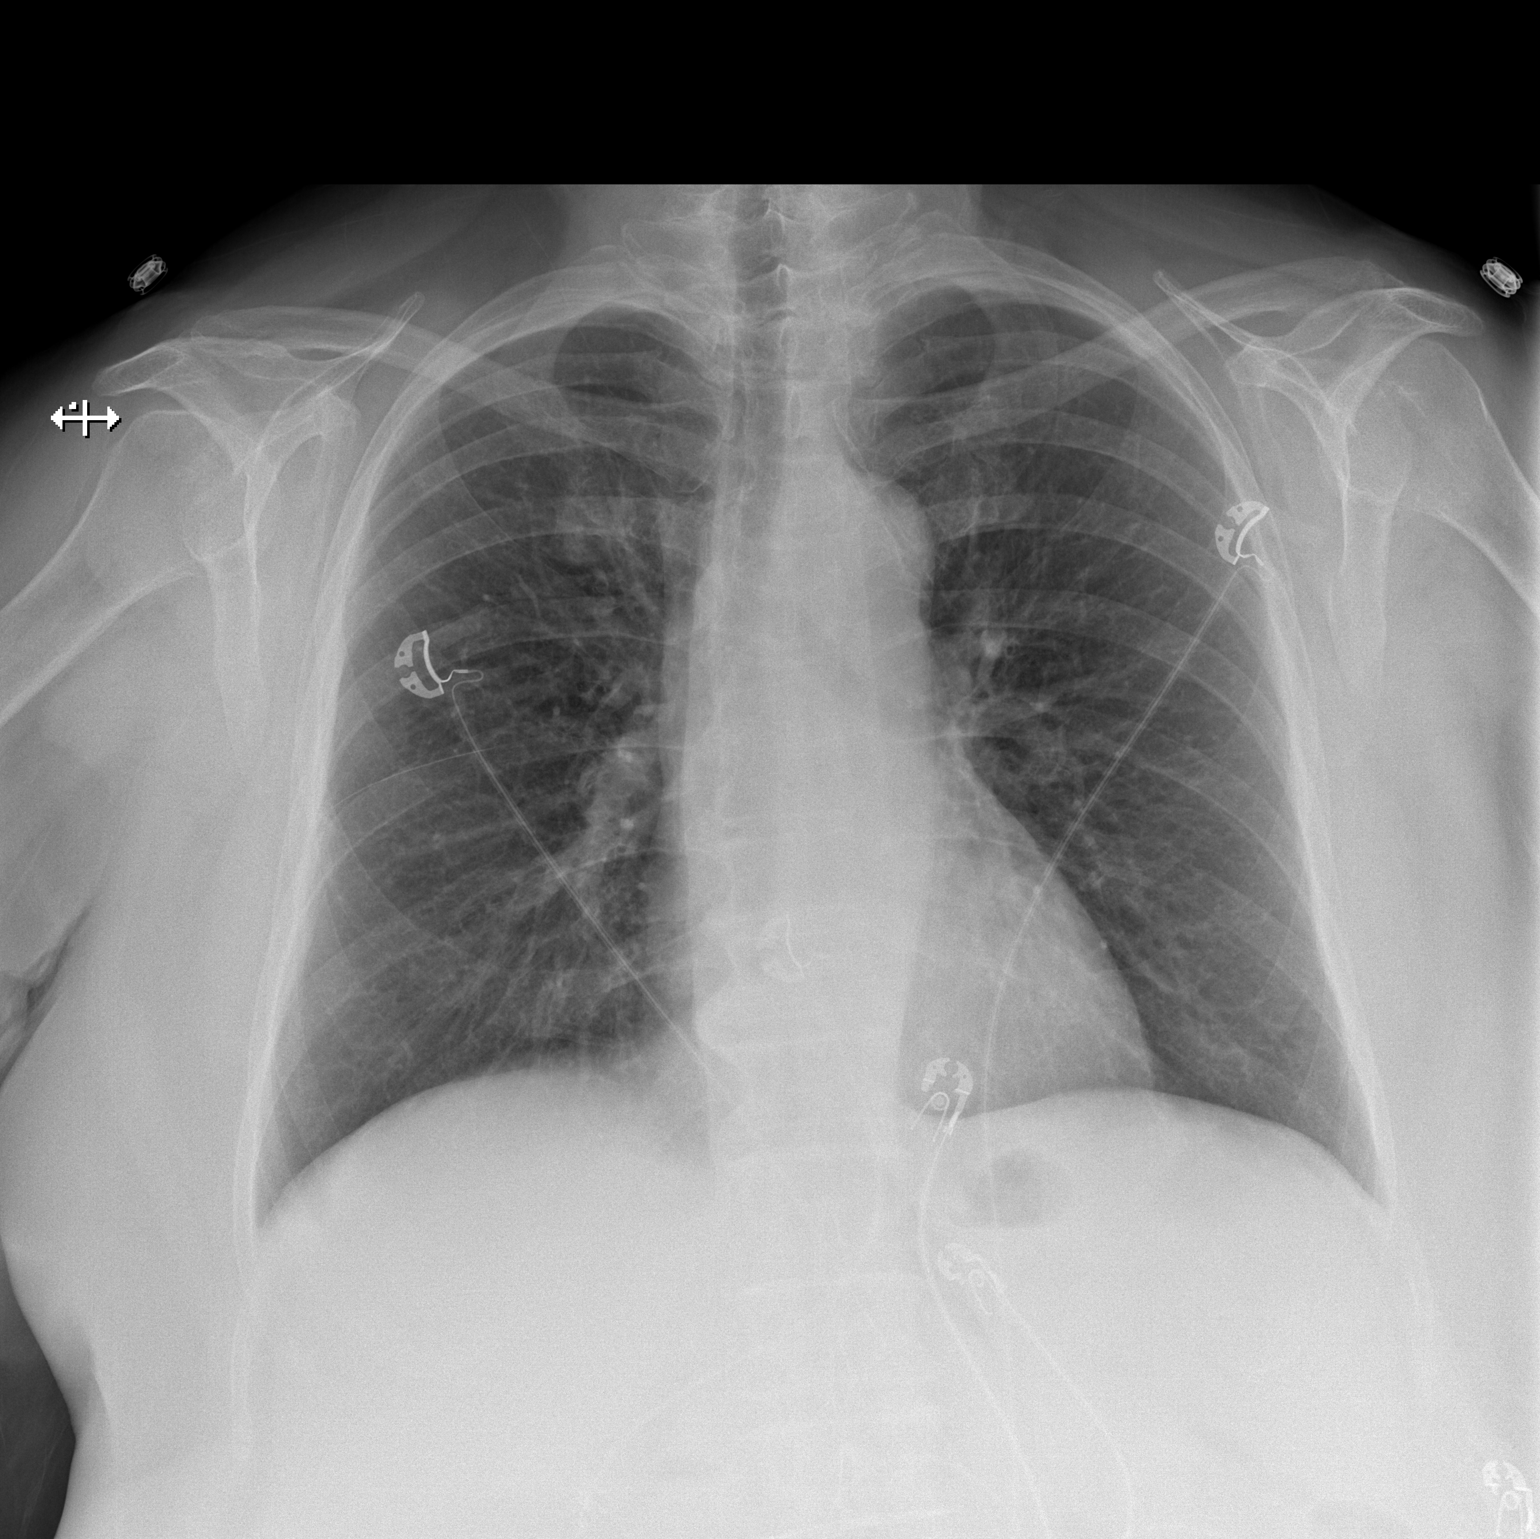

[w chest lat]
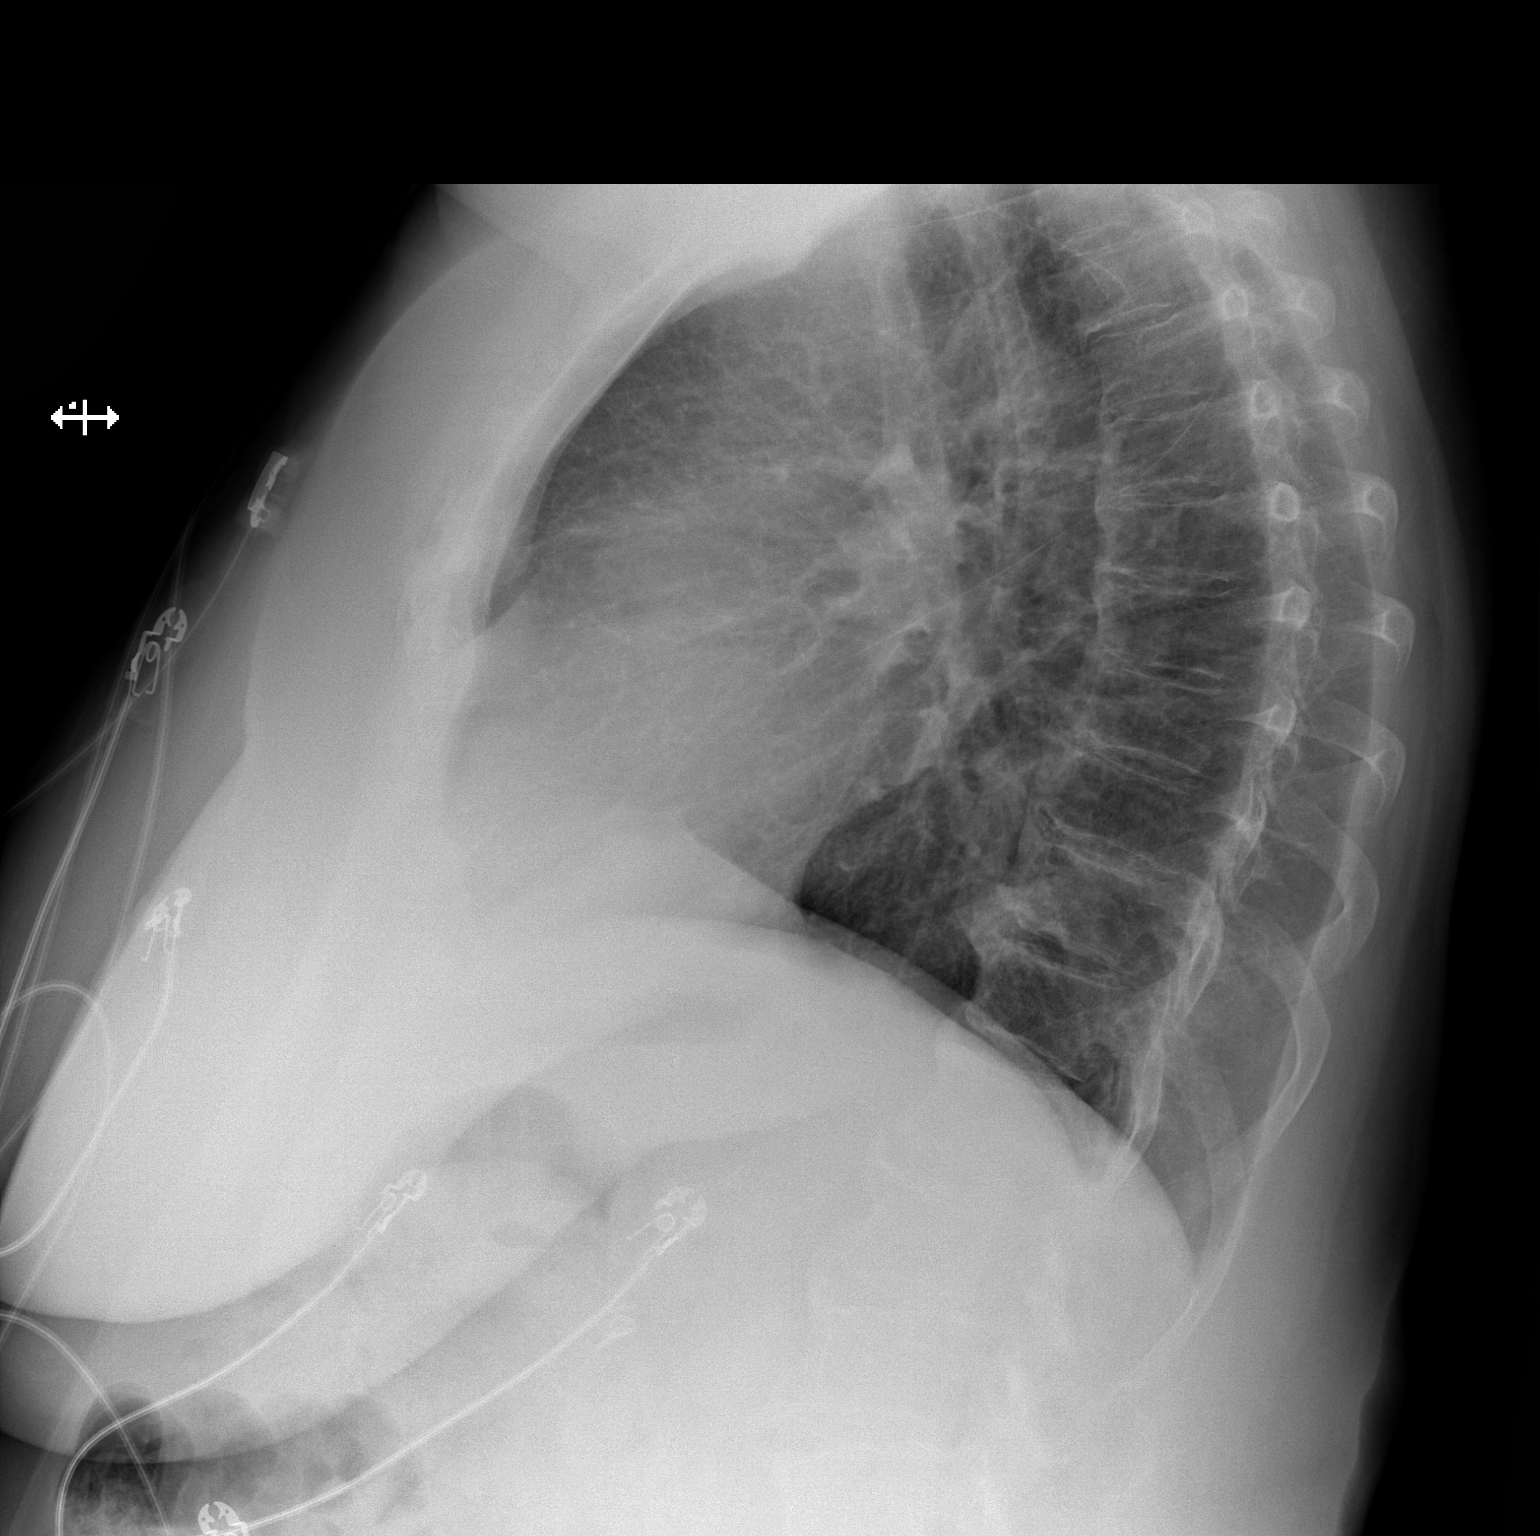

[2 of 2 positions shown; findings below may reference images not displayed]

FINDINGS: Heart and mediastinal contours are within normal limits. No focal
opacities or effusions. No acute bony abnormality. Degenerative
changes in the thoracic spine.
IMPRESSION: No active cardiopulmonary disease.

## 2017-01-26 DIAGNOSIS — M7051 Other bursitis of knee, right knee: Secondary | ICD-10-CM | POA: Diagnosis not present

## 2017-01-26 DIAGNOSIS — M79662 Pain in left lower leg: Secondary | ICD-10-CM | POA: Diagnosis not present

## 2017-01-26 DIAGNOSIS — M79661 Pain in right lower leg: Secondary | ICD-10-CM | POA: Diagnosis not present

## 2017-01-26 DIAGNOSIS — G8929 Other chronic pain: Secondary | ICD-10-CM | POA: Diagnosis not present

## 2017-01-26 DIAGNOSIS — M545 Low back pain: Secondary | ICD-10-CM | POA: Diagnosis not present

## 2017-01-26 DIAGNOSIS — J45909 Unspecified asthma, uncomplicated: Secondary | ICD-10-CM | POA: Diagnosis not present

## 2017-01-28 DIAGNOSIS — J45909 Unspecified asthma, uncomplicated: Secondary | ICD-10-CM | POA: Diagnosis not present

## 2017-01-28 DIAGNOSIS — M545 Low back pain: Secondary | ICD-10-CM | POA: Diagnosis not present

## 2017-01-28 DIAGNOSIS — M79662 Pain in left lower leg: Secondary | ICD-10-CM | POA: Diagnosis not present

## 2017-01-28 DIAGNOSIS — G8929 Other chronic pain: Secondary | ICD-10-CM | POA: Diagnosis not present

## 2017-01-28 DIAGNOSIS — M7051 Other bursitis of knee, right knee: Secondary | ICD-10-CM | POA: Diagnosis not present

## 2017-01-28 DIAGNOSIS — M79661 Pain in right lower leg: Secondary | ICD-10-CM | POA: Diagnosis not present

## 2017-01-31 DIAGNOSIS — Z6841 Body Mass Index (BMI) 40.0 and over, adult: Secondary | ICD-10-CM | POA: Diagnosis not present

## 2017-01-31 DIAGNOSIS — M545 Low back pain: Secondary | ICD-10-CM | POA: Diagnosis not present

## 2017-01-31 DIAGNOSIS — I951 Orthostatic hypotension: Secondary | ICD-10-CM | POA: Diagnosis not present

## 2017-01-31 DIAGNOSIS — M79661 Pain in right lower leg: Secondary | ICD-10-CM | POA: Diagnosis not present

## 2017-01-31 DIAGNOSIS — J45909 Unspecified asthma, uncomplicated: Secondary | ICD-10-CM | POA: Diagnosis not present

## 2017-01-31 DIAGNOSIS — M79662 Pain in left lower leg: Secondary | ICD-10-CM | POA: Diagnosis not present

## 2017-01-31 DIAGNOSIS — M7051 Other bursitis of knee, right knee: Secondary | ICD-10-CM | POA: Diagnosis not present

## 2017-01-31 DIAGNOSIS — G8929 Other chronic pain: Secondary | ICD-10-CM | POA: Diagnosis not present

## 2017-01-31 DIAGNOSIS — M1991 Primary osteoarthritis, unspecified site: Secondary | ICD-10-CM | POA: Diagnosis not present

## 2017-02-01 ENCOUNTER — Telehealth: Payer: Self-pay | Admitting: Internal Medicine

## 2017-02-01 DIAGNOSIS — M79662 Pain in left lower leg: Secondary | ICD-10-CM | POA: Diagnosis not present

## 2017-02-01 DIAGNOSIS — M545 Low back pain: Secondary | ICD-10-CM | POA: Diagnosis not present

## 2017-02-01 DIAGNOSIS — J45909 Unspecified asthma, uncomplicated: Secondary | ICD-10-CM | POA: Diagnosis not present

## 2017-02-01 DIAGNOSIS — M79661 Pain in right lower leg: Secondary | ICD-10-CM | POA: Diagnosis not present

## 2017-02-01 DIAGNOSIS — G8929 Other chronic pain: Secondary | ICD-10-CM | POA: Diagnosis not present

## 2017-02-01 DIAGNOSIS — M7051 Other bursitis of knee, right knee: Secondary | ICD-10-CM | POA: Diagnosis not present

## 2017-02-01 NOTE — Telephone Encounter (Signed)
Patient will be discharged today from OT but is still working on PT.  Does recommend assisted living due to memory issues but patient refuses.

## 2017-02-01 NOTE — Telephone Encounter (Signed)
Noted. Thx.

## 2017-02-02 DIAGNOSIS — G8929 Other chronic pain: Secondary | ICD-10-CM | POA: Diagnosis not present

## 2017-02-02 DIAGNOSIS — Z6841 Body Mass Index (BMI) 40.0 and over, adult: Secondary | ICD-10-CM | POA: Diagnosis not present

## 2017-02-02 DIAGNOSIS — M79662 Pain in left lower leg: Secondary | ICD-10-CM | POA: Diagnosis not present

## 2017-02-02 DIAGNOSIS — M79661 Pain in right lower leg: Secondary | ICD-10-CM | POA: Diagnosis not present

## 2017-02-02 DIAGNOSIS — I951 Orthostatic hypotension: Secondary | ICD-10-CM | POA: Diagnosis not present

## 2017-02-02 DIAGNOSIS — J45909 Unspecified asthma, uncomplicated: Secondary | ICD-10-CM | POA: Diagnosis not present

## 2017-02-02 DIAGNOSIS — M545 Low back pain: Secondary | ICD-10-CM | POA: Diagnosis not present

## 2017-02-02 DIAGNOSIS — M1991 Primary osteoarthritis, unspecified site: Secondary | ICD-10-CM | POA: Diagnosis not present

## 2017-02-04 ENCOUNTER — Other Ambulatory Visit: Payer: Self-pay | Admitting: *Deleted
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
## 2017-02-07 ENCOUNTER — Other Ambulatory Visit: Payer: Self-pay | Admitting: Licensed Clinical Social Worker

## 2017-02-07 NOTE — Addendum Note (Signed)
Addended byValente David on: 02/07/2017 11:29 AM   Modules accepted: Orders

## 2017-02-07 NOTE — Patient Outreach (Signed)
Request received from Brooke Joyce, LCSW to mail patient personal care resources.  Information mailed today. 
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
## 2017-02-09 DIAGNOSIS — M545 Low back pain: Secondary | ICD-10-CM | POA: Diagnosis not present

## 2017-02-09 DIAGNOSIS — M7051 Other bursitis of knee, right knee: Secondary | ICD-10-CM | POA: Diagnosis not present

## 2017-02-09 DIAGNOSIS — J45909 Unspecified asthma, uncomplicated: Secondary | ICD-10-CM | POA: Diagnosis not present

## 2017-02-09 DIAGNOSIS — M79662 Pain in left lower leg: Secondary | ICD-10-CM | POA: Diagnosis not present

## 2017-02-09 DIAGNOSIS — M79661 Pain in right lower leg: Secondary | ICD-10-CM | POA: Diagnosis not present

## 2017-02-09 DIAGNOSIS — G8929 Other chronic pain: Secondary | ICD-10-CM | POA: Diagnosis not present

## 2017-02-10 ENCOUNTER — Other Ambulatory Visit: Payer: Self-pay | Admitting: *Deleted
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
## 2017-02-14 ENCOUNTER — Other Ambulatory Visit: Payer: Self-pay | Admitting: Licensed Clinical Social Worker

## 2017-02-14 DIAGNOSIS — M7051 Other bursitis of knee, right knee: Secondary | ICD-10-CM | POA: Diagnosis not present

## 2017-02-14 DIAGNOSIS — M79662 Pain in left lower leg: Secondary | ICD-10-CM | POA: Diagnosis not present

## 2017-02-14 DIAGNOSIS — G8929 Other chronic pain: Secondary | ICD-10-CM | POA: Diagnosis not present

## 2017-02-14 DIAGNOSIS — J45909 Unspecified asthma, uncomplicated: Secondary | ICD-10-CM | POA: Diagnosis not present

## 2017-02-14 DIAGNOSIS — M79661 Pain in right lower leg: Secondary | ICD-10-CM | POA: Diagnosis not present

## 2017-02-14 DIAGNOSIS — M545 Low back pain: Secondary | ICD-10-CM | POA: Diagnosis not present

## 2017-02-14 NOTE — Patient Outreach (Signed)
[place] [facility]) Care Management  02/14/2017  Sabrina Page [birth year] [id number]  Assessment- CSW completed outreach call to patient's daughter and she answered successfully. Daughter reports that she has not received social work resources in the mail yet but assumes it will come this week. Daughter shares that she is interested in patient and spouse to be placed long term at [place]. Patient has been to facility before and likes it there. CSW contacted admissions and was informed that [facility] does not accept Medicaid for ALF placement but that they do accept Medicaid for Skilled. However, there is a long wait list at this time. CSW provided daughter with contact information so that she can place family on the wait list. Daughter is interested in [facility] for patient and spouse until they can eventually get placed at [place] facility. CSW provided education on the [facility] program, their benefits and their enrollment process. Daughter is agreeable to allow CSW to complete referral to [facility] program. CSW will write note on referral form stating that spouse is also interested in program.   Plan-CSW will complete referral to [facility] program tomorrow on 02/15/17.  Eula Fried, BSW, MSW, [email or web address] Phone: [phone] Fax: [phone]

## 2017-02-14 NOTE — Patient Outreach (Signed)
[place] [facility]) Care Management  02/14/2017  Takyah Ciaramitaro 01-21-1934 [id number]  Assessment- CSW completed outreach call to family after receiving update from [place]. Patient is interested in the [facility] program or to relocate to ALF. CSW completed call to patient's residence but was not able to reach anyone. CSW left a HIPPA compliant voice message encouraging a return call once available. CSW completed call to [place] and discussed case.  Plan-CSW will await for return call or complete additional outreach within one week.  Eula Fried, BSW, MSW, [email or web address] Phone: [phone] Fax: [phone]

## 2017-02-15 ENCOUNTER — Other Ambulatory Visit: Payer: Self-pay | Admitting: Licensed Clinical Social Worker

## 2017-02-15 NOTE — Patient Outreach (Signed)
[place] [facility]) Care Management  02/15/2017  Sabrina Page [birth year] [id number]  Assessment- CSW successfully completed referral form to [facility] for patient. CSW included on document that spouse is also interested in program. CSW successfully faxed referral form to [facility].  Plan-CSW will be out of the office for part of the week and will follow up with patient and family within two weeks.  Eula Fried, BSW, MSW, [email or web address] Phone: [phone] Fax: [phone]

## 2017-02-21 ENCOUNTER — Ambulatory Visit: Payer: Medicare Other | Admitting: Neurology

## 2017-02-21 ENCOUNTER — Encounter: Payer: Self-pay | Admitting: Neurology

## 2017-02-24 ENCOUNTER — Other Ambulatory Visit: Payer: Self-pay | Admitting: *Deleted

## 2017-02-24 NOTE — Patient Outreach (Addendum)
[place] [facility]) Care Management  02/24/2017  Sabrina Page Feb 03, 1934 [id number]   Call placed to member's daughter to follow up on member's current condition and contact with LCSW.  She state she is with a patient and unable to speak at this time.  She will return call when available.    Update @ 1230:  Call received back from daughter.  She report she has been in contact with LCSW, but has not received any follow up calls from community resources ([facility] specifically).  She state she had contact information for another resource, possibly the assisted living facility in [facility], but lost it and was unable to contact them regarding concerns.    She state fall risk & safety remain the main concern.  Member has completed in home PT, discussed the opportunity of outpatient rehab.  Daughter report she is a physical therapist herself and feel if she were to continue with therapy, in home would be best.  State she will discuss with primary MD.  Report she will continue to work with member as well to increase strength and provide reminders to use walker/wheelchair.    Call then placed to B. [place], to discuss community resources to provide additional resources.  She will follow up with member's daughter directly.    Call then placed to [facility].  Spoke to [place], denies receiving a referral.  Request made to re-send referral via fax, attention Intake Department.  LCSW notified.   Will follow up with member with home visit within the next month.   [name], [place], MSN [place] [phone]

## 2017-02-25 ENCOUNTER — Other Ambulatory Visit: Payer: Self-pay | Admitting: Licensed Clinical Social Worker

## 2017-02-25 NOTE — Patient Outreach (Signed)
[place] [facility]) Care Management  02/25/2017  Sabrina Page 1934/04/19 [id number]  Assessment- CSW received incoming call from [place]. Brayton Layman reports that family has not heard from [facility] yet and that she will follow up on this. RNCM reports that daughter has lost the [place] number and was not successful in placing patient and spouse on wait list for ALF placement. CSW completed call to patient's daughter but was unable to reach her. CSW left a HIPPA compliant voice message and included that requested number [phone]) and encouraged daughter to contact facility and put family members on wait list as soon as possible.   Plan-CSW will await to hear back from patient or family. CSW will follow up within two weeks.  Eula Fried, BSW, MSW, [email or web address] Phone: [phone] Fax: [phone]

## 2017-02-26 DIAGNOSIS — Z23 Encounter for immunization: Secondary | ICD-10-CM | POA: Diagnosis not present

## 2017-02-28 ENCOUNTER — Other Ambulatory Visit: Payer: Self-pay | Admitting: Internal Medicine

## 2017-02-28 ENCOUNTER — Other Ambulatory Visit: Payer: Self-pay | Admitting: Licensed Clinical Social Worker

## 2017-02-28 NOTE — Patient Outreach (Addendum)
[place] [facility]) Care Management  02/28/2017  Sabrina Page [birth year] [id number]  Assessment- CSW received update from [place] that she contacted [facility] and they were unable to find the referral that CSW placed. CSW informed staff member that [place] spoke with a representative and they confirmed that they received the referral. A new referral will need to be completed. CSW completed new referral for [facility] program on 02/28/17 over the phone.   Plan-CSW will continue to provide social work assistance and support.  Eula Fried, BSW, MSW, [email or web address] Phone: [phone] Fax: [phone]

## 2017-03-01 NOTE — Telephone Encounter (Signed)
LOV: 12/27/2016.  Please advise

## 2017-03-02 ENCOUNTER — Other Ambulatory Visit: Payer: Self-pay | Admitting: Internal Medicine

## 2017-03-02 NOTE — Telephone Encounter (Signed)
Faxed script back to [facility] pharmacy...[name]

## 2017-03-04 ENCOUNTER — Other Ambulatory Visit: Payer: Self-pay | Admitting: Licensed Clinical Social Worker

## 2017-03-04 NOTE — Patient Outreach (Signed)
[place] [facility]) Care Management  03/04/2017  Sabrina Page 1933/06/24 [id number]  Assessment- CSW completed outreach call to patient's family and daughter answered. Daughter reports that patient is doing well and that they have power at patient's residence. She reports that she did receive a call from [facility]. However, she does not know how she feels about program because she was informed by a friend that program looks into every financial detail in order to process patient. CSW informed daughter that they would evaluate patient's expenses if she were to be placed at a LTC nursing facility too. Daughter reports that she wants to take both patient and spouse to the [facility] program next week in order to complete a tour. CSW questioned if [facility] scheduled an appointment to come to their home and discuss program and she declined. CSW questioned if daughter contacted [facility] facility in order to place patient and spouse on the wait list. Daughter declined. CSW sent another text message to daughter with number provided. Daughter has agreed to contact facility and put patient and spouse on the waiting list within two weeks.  Plan-CSW will follow up within two weeks and continue to provide social work assistance and support.  Eula Fried, BSW, MSW, [email or web address] Phone: [phone] Fax: [phone]

## 2017-03-10 ENCOUNTER — Other Ambulatory Visit: Payer: Self-pay | Admitting: *Deleted

## 2017-03-10 NOTE — Patient Outreach (Signed)
[place] [facility]) Care Management  03/10/2017  Mathilde Rehm [birth year] [id number]   Call placed to member's daughter in attempt to schedule home visit for next week, no answer.  HIPAA compliant voice message left.  Will follow up next week.  [name], [place], MSN [place] [phone]

## 2017-03-11 ENCOUNTER — Other Ambulatory Visit: Payer: Self-pay | Admitting: Licensed Clinical Social Worker

## 2017-03-11 NOTE — Patient Outreach (Signed)
[place] [facility]) Care Management  03/11/2017  Sabrina Page [birth year] [id number]  Assessment- CSW completed outreach call to patient's daughter and she answered. Daughter reports that patient and spouse are not willing to go to [facility] and do not wish to go complete a tour at facility. She reports that she tried to explain the program and it's benefits to them but they were not interested." CSW questioned if family is still willing to go to [facility] and she confirmed that they were agreeable. However, daughter has still not placed family on wait list for program. However, she reports having applications from [employer] that she is currently working on. CSW encouraged her to get family on the wait list as soon as possible.  Plan-CSW will follow up with family within two weeks and continue to provide assistance and support.  Eula Fried, BSW, MSW, [email or web address] Phone: [phone] Fax: [phone]

## 2017-03-17 ENCOUNTER — Encounter: Payer: Self-pay | Admitting: Neurology

## 2017-03-17 ENCOUNTER — Ambulatory Visit (INDEPENDENT_AMBULATORY_CARE_PROVIDER_SITE_OTHER): Payer: Medicare Other | Admitting: Neurology

## 2017-03-17 VITALS — BP 144/76 | HR 71 | Ht 64.0 in | Wt 222.0 lb

## 2017-03-17 DIAGNOSIS — I951 Orthostatic hypotension: Secondary | ICD-10-CM

## 2017-03-17 DIAGNOSIS — G609 Hereditary and idiopathic neuropathy, unspecified: Secondary | ICD-10-CM | POA: Diagnosis not present

## 2017-03-17 MED ORDER — PYRIDOSTIGMINE BROMIDE 60 MG PO TABS: 60.0000 mg | ORAL_TABLET | Freq: Three times a day (TID) | ORAL | 11 refills | Status: DC

## 2017-03-17 NOTE — Patient Instructions (Signed)
Increase mestinon 60mg  three times daily  Start using compression stockings  Return to clinic in 6 months

## 2017-03-17 NOTE — Progress Notes (Signed)
Follow-up Visit   Date: 03/17/17    Sabrina Page MRN: [id number] DOB: Mar 16, 1934   Interim History: Sabrina Page is a 83 y.o.  female with ashtma, GERD, depression, hyperlipidemia, CKD, and peripheral vascular disease returning to the clinic for follow-up of orthostasis.  The patient was accompanied to the clinic by daughter and Interpretor who also provides collateral information.    History of present illness: Starting in the spring of 2017, she began having lightheadedness especially worse in the morning which improves as the day goes on.  Every time she stands, she gets lightheaded and has had 6 falls over the past year, the last occurring in May. She does not take any antihypertensive medication or diuretics, although she has leg swelling.  She was taken to the ER on 01/18/2016 after her daughter, a home health nurse, checked her orthostatic blood pressure and her SBP was noted to be in the 60s and she was very symptomatic.  She started midodrine '10mg'$  BID in early September and has noticed 30% improvement.  She has been complaint with using a walker and feels that this helps.  Her daughter also complains that patient may have some memory loss and tends to be very suspicious about things.  She states that patient has always been very jealous.  There are no delusional thoughts or hallucinations.  Patient lives with her husband, who is also suffering from memory problems. She has a caregiver that comes for a few hours and her daughter who helps with many IADLs.  No bowel/bladder dysfunction.  She also complains of dry eyes, no dry mouth.   She endorses numbness of the feet which is worse at night time.   UPDATE 08/16/2016:  She has suffered 4 falls at home and fractured her right hip in November 2017.  She was living with daughter for some time and now has returned back to an apartment with her husband.  She has a caregiver that comes 9am-12pm and 5-7pm. Daughter is  feeling overwhelmed because her parents do not want to live with her and she still has to manage their day-to-day activities. Her father wants to maintain his independence, but his dementia is making it difficult.  Patient manages medications for herself and her husband.  Daughter manages finances and is POA for both.   She continue to have lightheadedness and is compliant with her stockings and medications.  Daughter has noticed that her blood pressure can be low at home, but with activity can have SBP 160-170s.    UPDATE 03/17/2017:  She is here for 6 month follow-up visit.  She was briefly living with her daughter and decided to return back to her home.  She is living with her with husband on the 3rd floor apartment and daughter is requesting letter for her to be moved to 1st floor apartment. She uses a wheelchair for long distances and walks with a walker at home.  Unfortunately, her lightheadedness can be severe in the morning and following meals, that she has become very sedentary.  Her right hip and knee also limit her ability to walk.  Physical therapy recommended assisting living, but patient wants to stay at home.  They have a caregiver who comes 2 hours in the morning and at night. She has suffered one fall out of bed while sleeping.  Mestinon '30mg'$  TID has helped.  Medications:  Current Outpatient Prescriptions on File Prior to Visit  Medication Sig Dispense Refill  . aspirin 81 MG EC tablet  Take 1 tablet (81 mg total) by mouth 2 (two) times daily. 60 tablet 0  . B Complex-C (SUPER B COMPLEX PO) Take 1 tablet by mouth every morning.     . Cholecalciferol 1000 UNITS capsule Take 1,000 Units by mouth every morning.     . clonazePAM (KLONOPIN) 0.5 MG tablet TAKE 1 TABLET BY MOUTH TWICE A DAY AS NEEDED FOR ANXIETY 60 tablet 3  . doxycycline (VIBRA-TABS) 100 MG tablet Take 1 tablet (100 mg total) by mouth 2 (two) times daily. (Patient not taking: Reported on 02/10/2017) 20 tablet 0  . FLUoxetine  (PROZAC) 40 MG capsule Take 1 capsule (40 mg total) by mouth daily. 90 capsule 3  . FLUoxetine (PROZAC) 40 MG capsule TAKE 1 CAPSULE EVERY DAY 90 capsule 3  . KLOR-CON 8 MEQ tablet TAKE 1 TABLET EVERY MORNING 30 tablet 5  . loratadine (CLARITIN) 10 MG tablet Take 1 tablet (10 mg total) by mouth every morning. 100 tablet 3  . midodrine (PROAMATINE) 10 MG tablet TAKE 1 TABLET (10 MG TOTAL) BY MOUTH 3 (THREE) TIMES DAILY. 90 tablet 5  . Omega-3 Fatty Acids (FISH OIL) 1000 MG CAPS Take 1 capsule by mouth every morning.     Marland Kitchen omeprazole (PRILOSEC) 20 MG capsule Take 2 capsules (40 mg total) by mouth daily. 180 capsule 3  . pyridostigmine (MESTINON) 60 MG tablet Take 0.5 tablets (30 mg total) by mouth 3 (three) times daily. 90 tablet 5  . traMADol (ULTRAM) 50 MG tablet TAKE 1/2 TO 1 TABLET BY MOUTH EVERY 6 HOURS AS NEEDED FOR PAIN 120 tablet 1   No current facility-administered medications on file prior to visit.     Allergies:  Allergies  Allergen Reactions  . Atorvastatin     REACTION: upset stomach  . Enalapril Maleate   . Hctz [Hydrochlorothiazide]     Dizzy   . Lovastatin     REACTION: tongue stress  . Simvastatin     REACTION: mouth sores  . Topamax [Topiramate]     syncope    Review of Systems:  CONSTITUTIONAL: No fevers, chills, night sweats, or weight loss.  EYES: No visual changes or eye pain ENT: No hearing changes.  No history of nose bleeds.   RESPIRATORY: No cough, wheezing and shortness of breath.   CARDIOVASCULAR: Negative for chest pain, and palpitations.   GI: Negative for abdominal discomfort, blood in stools or black stools.  No recent change in bowel habits.   GU:  No history of incontinence.   MUSCLOSKELETAL: No history of joint pain or swelling.  No myalgias.   SKIN: Negative for lesions, rash, and itching.   ENDOCRINE: Negative for cold or heat intolerance, polydipsia or goiter.   PSYCH:  No depression +anxiety symptoms.   NEURO: As Above.   Vital Signs:   BP (!) 144/76   Pulse 71   Ht '5\' 4"'$  (1.626 m)   Wt 222 lb (100.7 kg)   SpO2 97%   BMI 38.11 kg/m    Neurological Exam: MENTAL STATUS including orientation to time, place, person, recent and remote memory, attention span and concentration, language, and fund of knowledge is fairly intact.  Speech has strong accent.  CRANIAL NERVES:  No visual field defects.  Pupils equal round and reactive to light.  Normal conjugate, extra-ocular eye movements in all directions of gaze.  No ptosis.  Face is symmetric. Palate elevates symmetrically.  Tongue is midline.  MOTOR:  Motor strength is 5/5 in all extremities.  No atrophy, fasciculations or abnormal movements.  No pronator drift.  Tone is normal.    COORDINATION/GAIT:  Normal finger-to- nose-finger.  Intact rapid alternating movements bilaterally.  Gait is not tested as patient is in wheelchair.  Data: US carotids 07/28/2015:  1-39% bilateral ICA stenosis  Labs 02/17/2016:  vitamin B12 556, vitamin B6 32.7,  SSA/B neg, copper 109, SPEP with IFE no M protein, ESR 15  MRI brain wo contrast 03/02/2016:  No acute infarct or intracranial hemorrhage.  Mild chronic microvascular changes. Moderate global atrophy. Ventricular prominence probably secondary to central atrophy rather than hydrocephalus. No intracranial mass lesion noted on this unenhanced exam. Mild cervical spondylotic changes C4-5 incompletely assessed.  IMPRESSION/PLAN: 1.  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  She does not have clinical features suggestive of parkinson-plus syndrome such as multiple system atrophy, which I will continue to monitor              - Continue midodrine to '10mg'$  TID   - Increase mestinon '60mg'$  TID              - Consider Droxidopa going forward              - Continue home physical therapy              - Encouraged to use rollator at all times              - Fall precaution discussed, makes positional changes  slowly to minimize lightheadedness              - Continue to use knee high compression stockings and abdominal binder  2.  Mild cognitive impairment.  MRI brain shows global atrophy  - Daughter is POA  - Home safety discussed and patient is not interested in staying in an assisted living facility  - Letter provided to make accommodations for first floor apartment             - Acetylcholinesterase inhibitors declined  Return to clinic in 6 months  Greater than 50% of this 30 minute visit was spent in counseling, explanation of diagnosis, planning of further management, and coordination of care due to the nature of their progressive conditions.   Thank you for allowing me to participate in patient's care.  If I can answer any additional questions, I would be pleased to do so.    Sincerely,    Donika K. Posey Pronto, DO

## 2017-03-19 ENCOUNTER — Inpatient Hospital Stay (HOSPITAL_COMMUNITY)
Admission: EM | Admit: 2017-03-19 | Discharge: 2017-03-23 | DRG: 312 | Disposition: A | Discharge status: Skilled Nursing Facility | Payer: Medicare Other | Attending: Internal Medicine | Admitting: Internal Medicine

## 2017-03-19 ENCOUNTER — Other Ambulatory Visit: Payer: Self-pay | Admitting: Internal Medicine

## 2017-03-19 ENCOUNTER — Emergency Department (HOSPITAL_COMMUNITY): Payer: Medicare Other

## 2017-03-19 DIAGNOSIS — W19XXXA Unspecified fall, initial encounter: Secondary | ICD-10-CM

## 2017-03-19 DIAGNOSIS — R296 Repeated falls: Secondary | ICD-10-CM | POA: Diagnosis present

## 2017-03-19 DIAGNOSIS — I951 Orthostatic hypotension: Secondary | ICD-10-CM | POA: Diagnosis not present

## 2017-03-19 DIAGNOSIS — E785 Hyperlipidemia, unspecified: Secondary | ICD-10-CM | POA: Diagnosis present

## 2017-03-19 DIAGNOSIS — J449 Chronic obstructive pulmonary disease, unspecified: Secondary | ICD-10-CM | POA: Diagnosis not present

## 2017-03-19 DIAGNOSIS — I10 Essential (primary) hypertension: Secondary | ICD-10-CM | POA: Diagnosis present

## 2017-03-19 DIAGNOSIS — I110 Hypertensive heart disease with heart failure: Secondary | ICD-10-CM | POA: Diagnosis present

## 2017-03-19 DIAGNOSIS — Z79899 Other long term (current) drug therapy: Secondary | ICD-10-CM

## 2017-03-19 DIAGNOSIS — F329 Major depressive disorder, single episode, unspecified: Secondary | ICD-10-CM | POA: Diagnosis present

## 2017-03-19 DIAGNOSIS — Y92009 Unspecified place in unspecified non-institutional (private) residence as the place of occurrence of the external cause: Secondary | ICD-10-CM

## 2017-03-19 DIAGNOSIS — G903 Multi-system degeneration of the autonomic nervous system: Secondary | ICD-10-CM | POA: Diagnosis present

## 2017-03-19 DIAGNOSIS — R531 Weakness: Secondary | ICD-10-CM | POA: Diagnosis not present

## 2017-03-19 DIAGNOSIS — M810 Age-related osteoporosis without current pathological fracture: Secondary | ICD-10-CM | POA: Diagnosis present

## 2017-03-19 DIAGNOSIS — M545 Low back pain: Secondary | ICD-10-CM | POA: Diagnosis not present

## 2017-03-19 DIAGNOSIS — K219 Gastro-esophageal reflux disease without esophagitis: Secondary | ICD-10-CM | POA: Diagnosis present

## 2017-03-19 DIAGNOSIS — Z7982 Long term (current) use of aspirin: Secondary | ICD-10-CM

## 2017-03-19 DIAGNOSIS — G4730 Sleep apnea, unspecified: Secondary | ICD-10-CM | POA: Diagnosis present

## 2017-03-19 DIAGNOSIS — Z87891 Personal history of nicotine dependence: Secondary | ICD-10-CM

## 2017-03-19 DIAGNOSIS — Z888 Allergy status to other drugs, medicaments and biological substances status: Secondary | ICD-10-CM

## 2017-03-19 DIAGNOSIS — G4733 Obstructive sleep apnea (adult) (pediatric): Secondary | ICD-10-CM | POA: Diagnosis not present

## 2017-03-19 DIAGNOSIS — E669 Obesity, unspecified: Secondary | ICD-10-CM | POA: Diagnosis present

## 2017-03-19 DIAGNOSIS — R42 Dizziness and giddiness: Secondary | ICD-10-CM

## 2017-03-19 DIAGNOSIS — I5032 Chronic diastolic (congestive) heart failure: Secondary | ICD-10-CM | POA: Diagnosis not present

## 2017-03-19 DIAGNOSIS — S199XXA Unspecified injury of neck, initial encounter: Secondary | ICD-10-CM | POA: Diagnosis not present

## 2017-03-19 DIAGNOSIS — R404 Transient alteration of awareness: Secondary | ICD-10-CM | POA: Diagnosis not present

## 2017-03-19 DIAGNOSIS — I739 Peripheral vascular disease, unspecified: Secondary | ICD-10-CM | POA: Diagnosis present

## 2017-03-19 DIAGNOSIS — Z6835 Body mass index (BMI) 35.0-35.9, adult: Secondary | ICD-10-CM

## 2017-03-19 DIAGNOSIS — S0990XA Unspecified injury of head, initial encounter: Secondary | ICD-10-CM | POA: Diagnosis not present

## 2017-03-19 LAB — CBC WITH DIFFERENTIAL/PLATELET
Basophils Absolute: 0.1 10*3/uL (ref 0.0–0.1)
Basophils Relative: 1 %
Eosinophils Absolute: 0.2 10*3/uL (ref 0.0–0.7)
Eosinophils Relative: 3 %
HCT: 42.3 % (ref 36.0–46.0)
Hemoglobin: 13.7 g/dL (ref 12.0–15.0)
Lymphocytes Relative: 30 %
Lymphs Abs: 2 10*3/uL (ref 0.7–4.0)
MCH: 29.5 pg (ref 26.0–34.0)
MCHC: 32.4 g/dL (ref 30.0–36.0)
MCV: 91 fL (ref 78.0–100.0)
Monocytes Absolute: 0.6 10*3/uL (ref 0.1–1.0)
Monocytes Relative: 9 %
Neutro Abs: 3.7 10*3/uL (ref 1.7–7.7)
Neutrophils Relative %: 57 %
Platelets: 157 10*3/uL (ref 150–400)
RBC: 4.65 MIL/uL (ref 3.87–5.11)
RDW: 13.2 % (ref 11.5–15.5)
WBC: 6.6 10*3/uL (ref 4.0–10.5)

## 2017-03-19 LAB — COMPREHENSIVE METABOLIC PANEL
ALT: 16 U/L (ref 14–54)
AST: 23 U/L (ref 15–41)
Albumin: 3.4 g/dL — ABNORMAL LOW (ref 3.5–5.0)
Alkaline Phosphatase: 58 U/L (ref 38–126)
Anion gap: 6 (ref 5–15)
BUN: 13 mg/dL (ref 6–20)
CO2: 29 mmol/L (ref 22–32)
Calcium: 8.9 mg/dL (ref 8.9–10.3)
Chloride: 101 mmol/L (ref 101–111)
Creatinine, Ser: 0.94 mg/dL (ref 0.44–1.00)
GFR calc Af Amer: >60 mL/min (ref >60)
GFR calc non Af Amer: 55 mL/min — ABNORMAL LOW (ref >60)
Glucose, Bld: 99 mg/dL (ref 65–99)
Potassium: 4.3 mmol/L (ref 3.5–5.1)
Sodium: 136 mmol/L (ref 135–145)
Total Bilirubin: 0.7 mg/dL (ref 0.3–1.2)
Total Protein: 6.6 g/dL (ref 6.5–8.1)

## 2017-03-19 LAB — URINALYSIS, ROUTINE W REFLEX MICROSCOPIC
Bilirubin Urine: NEGATIVE
Glucose, UA: NEGATIVE mg/dL
Hgb urine dipstick: NEGATIVE
Ketones, ur: NEGATIVE mg/dL
Leukocytes, UA: NEGATIVE
Nitrite: NEGATIVE
Protein, ur: NEGATIVE mg/dL
Specific Gravity, Urine: 1.004 — ABNORMAL LOW (ref 1.005–1.030)
pH: 7 (ref 5.0–8.0)

## 2017-03-19 LAB — TROPONIN I: Troponin I: <0.03 ng/mL (ref <0.03)

## 2017-03-19 MED ORDER — LORATADINE 10 MG PO TABS
10.0000 mg | ORAL_TABLET | Freq: Every morning | ORAL | Status: DC
  Administered 2017-03-20 – 2017-03-23 (×4): 10 mg via ORAL
  Filled 2017-03-19 (×4): qty 1

## 2017-03-19 MED ORDER — MIDODRINE HCL 5 MG PO TABS
10.0000 mg | ORAL_TABLET | Freq: Three times a day (TID) | ORAL | Status: DC
  Administered 2017-03-20 – 2017-03-23 (×9): 10 mg via ORAL
  Filled 2017-03-19 (×9): qty 2

## 2017-03-19 MED ORDER — ACETAMINOPHEN 650 MG RE SUPP: 650.0000 mg | Freq: Four times a day (QID) | RECTAL | Status: DC | PRN

## 2017-03-19 MED ORDER — MIDODRINE HCL 5 MG PO TABS: 10.0000 mg | ORAL_TABLET | Freq: Three times a day (TID) | ORAL | Status: DC

## 2017-03-19 MED ORDER — TRAZODONE HCL 50 MG PO TABS: 50.0000 mg | ORAL_TABLET | Freq: Every evening | ORAL | Status: DC | PRN

## 2017-03-19 MED ORDER — SODIUM CHLORIDE 0.9 % IV SOLN: 250.0000 mL | INTRAVENOUS | Status: DC | PRN

## 2017-03-19 MED ORDER — SODIUM CHLORIDE 0.9% FLUSH: 3.0000 mL | INTRAVENOUS | Status: DC | PRN

## 2017-03-19 MED ORDER — POTASSIUM CHLORIDE ER 8 MEQ PO TBCR
8.0000 meq | EXTENDED_RELEASE_TABLET | Freq: Every morning | ORAL | Status: DC
  Filled 2017-03-19: qty 1

## 2017-03-19 MED ORDER — FLUOXETINE HCL 20 MG PO CAPS
40.0000 mg | ORAL_CAPSULE | Freq: Every day | ORAL | Status: DC
  Administered 2017-03-20 – 2017-03-23 (×4): 40 mg via ORAL
  Filled 2017-03-19 (×5): qty 2

## 2017-03-19 MED ORDER — PYRIDOSTIGMINE BROMIDE 60 MG PO TABS
60.0000 mg | ORAL_TABLET | Freq: Three times a day (TID) | ORAL | Status: DC
  Administered 2017-03-19 – 2017-03-23 (×10): 60 mg via ORAL
  Filled 2017-03-19 (×13): qty 1

## 2017-03-19 MED ORDER — ALBUTEROL SULFATE (2.5 MG/3ML) 0.083% IN NEBU: 2.5000 mg | INHALATION_SOLUTION | RESPIRATORY_TRACT | Status: DC | PRN

## 2017-03-19 MED ORDER — TRAMADOL HCL 50 MG PO TABS: 50.0000 mg | ORAL_TABLET | Freq: Four times a day (QID) | ORAL | Status: DC | PRN

## 2017-03-19 MED ORDER — OMEGA-3-ACID ETHYL ESTERS 1 G PO CAPS
1.0000 g | ORAL_CAPSULE | Freq: Every day | ORAL | Status: DC
  Administered 2017-03-20 – 2017-03-23 (×4): 1 g via ORAL
  Filled 2017-03-19 (×5): qty 1

## 2017-03-19 MED ORDER — ACETAMINOPHEN 325 MG PO TABS
650.0000 mg | ORAL_TABLET | Freq: Four times a day (QID) | ORAL | Status: DC | PRN
  Administered 2017-03-21: 650 mg via ORAL
  Filled 2017-03-19: qty 2

## 2017-03-19 MED ORDER — OMEGA-3-ACID ETHYL ESTERS 1 G PO CAPS: 1.0000 g | ORAL_CAPSULE | Freq: Every day | ORAL | Status: DC

## 2017-03-19 MED ORDER — VITAMIN D 1000 UNITS PO TABS
1000.0000 [IU] | ORAL_TABLET | Freq: Every morning | ORAL | Status: DC
  Administered 2017-03-20 – 2017-03-23 (×4): 1000 [IU] via ORAL
  Filled 2017-03-19 (×4): qty 1

## 2017-03-19 MED ORDER — CLONAZEPAM 0.5 MG PO TABS: 0.5000 mg | ORAL_TABLET | Freq: Every day | ORAL | Status: DC

## 2017-03-19 MED ORDER — PANTOPRAZOLE SODIUM 40 MG PO TBEC
40.0000 mg | DELAYED_RELEASE_TABLET | Freq: Every day | ORAL | Status: DC
  Administered 2017-03-20 – 2017-03-23 (×4): 40 mg via ORAL
  Filled 2017-03-19 (×4): qty 1

## 2017-03-19 MED ORDER — FLUOXETINE HCL 40 MG PO CAPS: 20.0000 mg | ORAL_CAPSULE | Freq: Every day | ORAL | Status: DC

## 2017-03-19 MED ORDER — ASPIRIN EC 81 MG PO TBEC
81.0000 mg | DELAYED_RELEASE_TABLET | Freq: Two times a day (BID) | ORAL | Status: DC
  Administered 2017-03-19 – 2017-03-23 (×8): 81 mg via ORAL
  Filled 2017-03-19 (×8): qty 1

## 2017-03-19 MED ORDER — HEPARIN SODIUM (PORCINE) 5000 UNIT/ML IJ SOLN
5000.0000 [IU] | Freq: Three times a day (TID) | INTRAMUSCULAR | Status: DC
  Administered 2017-03-19 – 2017-03-23 (×10): 5000 [IU] via SUBCUTANEOUS
  Filled 2017-03-19 (×10): qty 1

## 2017-03-19 MED ORDER — PYRIDOSTIGMINE BROMIDE 60 MG PO TABS: 60.0000 mg | ORAL_TABLET | Freq: Three times a day (TID) | ORAL | Status: DC

## 2017-03-19 MED ORDER — ONDANSETRON HCL 4 MG/2ML IJ SOLN
4.0000 mg | Freq: Four times a day (QID) | INTRAMUSCULAR | Status: DC | PRN
  Administered 2017-03-21 (×2): 4 mg via INTRAVENOUS
  Filled 2017-03-19 (×2): qty 2

## 2017-03-19 MED ORDER — ONDANSETRON HCL 4 MG PO TABS
4.0000 mg | ORAL_TABLET | Freq: Four times a day (QID) | ORAL | Status: DC | PRN
  Administered 2017-03-23: 4 mg via ORAL
  Filled 2017-03-19: qty 1

## 2017-03-19 MED ORDER — SODIUM CHLORIDE 0.9 % IV SOLN
INTRAVENOUS | Status: DC
  Administered 2017-03-19: 75 mL/h via INTRAVENOUS
  Administered 2017-03-20 – 2017-03-22 (×4): via INTRAVENOUS

## 2017-03-19 MED ORDER — CLONAZEPAM 0.5 MG PO TABS
0.5000 mg | ORAL_TABLET | Freq: Every day | ORAL | Status: DC
  Administered 2017-03-19 – 2017-03-23 (×5): 0.5 mg via ORAL
  Filled 2017-03-19 (×5): qty 1

## 2017-03-19 MED ORDER — POLYETHYLENE GLYCOL 3350 17 G PO PACK: 17.0000 g | PACK | Freq: Every day | ORAL | Status: DC | PRN

## 2017-03-19 MED ORDER — SODIUM CHLORIDE 0.9% FLUSH
3.0000 mL | Freq: Two times a day (BID) | INTRAVENOUS | Status: DC
  Administered 2017-03-20 – 2017-03-21 (×2): 3 mL via INTRAVENOUS

## 2017-03-19 MED ORDER — SODIUM CHLORIDE 0.9 % IV BOLUS (SEPSIS)
500.0000 mL | Freq: Once | INTRAVENOUS | Status: AC
  Administered 2017-03-19: 500 mL via INTRAVENOUS

## 2017-03-19 MED ORDER — SENNA 8.6 MG PO TABS
1.0000 | ORAL_TABLET | Freq: Two times a day (BID) | ORAL | Status: DC
  Administered 2017-03-19 – 2017-03-23 (×7): 8.6 mg via ORAL
  Filled 2017-03-19 (×8): qty 1

## 2017-03-19 NOTE — ED Notes (Signed)
Attempted to call report

## 2017-03-19 NOTE — ED Notes (Signed)
Patient transported to X-ray 

## 2017-03-19 NOTE — ED Triage Notes (Signed)
Pt BIB EMS from home for fall last night and earlier this AM. Pt denies hitting head or LOC; family states pt might have hit her head last night. Pt not on blood thinners. Hx of orthostatic hypotention with recent medication changes. Pt denies CP or pain at this time. [place] speaking, family and ipad interpreter at bedside.

## 2017-03-19 NOTE — H&P (Signed)
Patient Demographics:    Sabrina Page, is a 83 y.o. female  MRN: [id number]   DOB - Dec 16, 1933  Admit Date - 03/19/2017  Outpatient Primary MD for the patient is Plotnikov, Evie Lacks, MD   Assessment & Plan:    Principal Problem:   Orthostatic hypotension Active Problems:   Essential hypertension   DIZZINESS   OSA on CPAP   Fall at home  1)Very symptomatic orthostatic Hypotension- diagnosed in the spring of 2017, now with recurrent falls and inability to ambulate,  in ED recumbent blood pressure was 160/77, standing blood pressure was 56/17, refer to observation status , gentle IV fluid hydration, TED stockings, consider abdominal binders and liberalized salt intake. Dr. [name]  increased patient's Mestinon to 60 mg 3 times a day pn 03/17/17, continue midodrine 10 mg 3 times a day, if patient remains very orthostatic on 03/20/2017 and unable to stand or ambulate then please consider neurology consult with the view to adding Droxidopa. Post discharge consider endocrinology consult as outpatient for further workup for orthostatic hypotension. Consider weaning off Prozac as this may be contributing to orthostatic hypotension   2)HFpEF- last known EF according to echo from August 2017 is about 60%, no overt CHF exacerbation symptoms at this time, gentle IV hydration as above in #1   3)Disposition- await further orthostatic vitals in a.m., get PT/OT consult, get social work consult for possible discharge to assisted living facility versus home with home health   With History of - Reviewed by me  Past Medical History:  Diagnosis Date  . Allergy    rhinitis  . Asthma   . Chronic kidney disease    hx of cystitis   . COPD (chronic obstructive pulmonary disease) ([place])   . Depression   . Dizziness      vertigo   . Gallstones   . GERD (gastroesophageal reflux disease)   . Hyperlipidemia   . Hypertension   . Obesity   . Osteoarthritis   . Osteoporosis   . Peripheral vascular disease ([facility])    swelling   . Pneumonia   . Shortness of breath    with exertion   . Shoulder injury    left  . Sleep apnea    never diagnosed   . Urinary incontinence       Past Surgical History:  Procedure Laterality Date  . CHOLECYSTECTOMY  09/14/2011   Procedure: LAPAROSCOPIC CHOLECYSTECTOMY;  Surgeon: Odis Hollingshead, MD;  Location: [facility];  Service: General;  Laterality: N/A;  attempted intraoperative cholangiogram   . HIP ARTHROPLASTY Right 04/17/2016   Procedure: ARTHROPLASTY BIPOLAR HIP (HEMIARTHROPLASTY);  Surgeon: Paralee Cancel, MD;  Location: [facility];  Service: Orthopedics;  Laterality: Right;  . L TKR    . LIVER BIOPSY  09/14/2011   Procedure: LIVER BIOPSY;  Surgeon: Odis Hollingshead, MD;  Location: [facility];  Service: General;  Laterality: N/A;  . OTHER SURGICAL HISTORY  left leg surgery   . OTHER SURGICAL HISTORY     left breast surgery due to mastitis   . right left knee arthroplast      Chief Complaint  Patient presents with  . Fall      HPI:    Sabrina Page  is a 83 y.o. femalewith past medical history relevant for OSA, GERD, depression, hyperlipidemia, and orthostatic hypotension as well as and peripheral vascular disease who presents to the ED with orthostatic concerns after falling on home.  The patient was accompanied to the clinic by daughter [name]) and Interpreter ([place]) who also provides collateral information. The Pharmacologist" also understands [place], from what I can gather patient fell out of bed trying to go to the commode today due to orthostatic dizziness, no chest pains no palpitations. The husband who was present called patient's daughter, according to the patient's daughter over the phone patient sounded "okay", EMS was called because patient was unable  to get herself up from the floor. Patient and husband denied any loss of consciousness or head injury. No seizure type activity and no incontinence or tongue biting   Patient was seen by a neurologist Dr. Rochele Raring on 03/17/2017 , her Mestinon was increased to 60 mg 3 times a day and she was advised to continue midodrine 10 mg 3 times a day at that time.  Denies vomiting or frank diarrhea she did have about 3 bowel movements in the last 24 hours but there were not loose. Patient admits to reduced oral fluid intake due to concerns about having to get up to urinate.   In ED.... CT head without acute findings, CT C-spine without acute findings, lumbar Xray without acute findings Patient received IV fluids, however she was unable to get up and ambulate, in ED recumbent blood pressure was 160/77, standing blood pressure was 56/17, given severe orthostatic hypotension, hospitalist service was called to admit.   Initial troponin and  labs are largely unremarkable at this time    Review of systems:    In addition to the HPI above,   A full 12 point Review of 10 Systems was done, except as stated above, all other Review of 10 Systems were negative.    Social History:  Reviewed by me    Social History  Substance Use Topics  . Smoking status: Former Smoker    Packs/day: 0.50    Years: 35.00    Types: Cigarettes    Quit date: 05/24/2001  . Smokeless tobacco: Never Used  . Alcohol use Yes     Comment: socially        Family History :  Reviewed by me    Family History  Problem Relation Age of Onset  . Hypertension Mother   . Arthritis Mother   . Diabetes Mother   . Stroke Mother   . Stroke Father   . Hypertension Father   . Hypertension Other   . CAD Neg Hx     Home Medications:   Prior to Admission medications   Medication Sig Start Date End Date Taking? Authorizing Provider  aspirin 81 MG EC tablet Take 1 tablet (81 mg total) by mouth 2 (two) times daily. 07/19/16    Plotnikov, Evie Lacks, MD  B Complex-C (SUPER B COMPLEX PO) Take 1 tablet by mouth every morning.     [provider]  Cholecalciferol 1000 UNITS capsule Take 1,000 Units by mouth every morning.     [provider]  clonazePAM Bobbye [place]) 0.5  MG tablet TAKE 1 TABLET BY MOUTH TWICE A DAY AS NEEDED FOR ANXIETY 03/02/17   Plotnikov, Evie Lacks, MD  doxycycline (VIBRA-TABS) 100 MG tablet Take 1 tablet (100 mg total) by mouth 2 (two) times daily. Patient not taking: Reported on 02/10/2017 01/09/17   Robyn Haber, MD  FLUoxetine (PROZAC) 40 MG capsule Take 1 capsule (40 mg total) by mouth daily. 06/29/16   Plotnikov, Evie Lacks, MD  FLUoxetine (PROZAC) 40 MG capsule TAKE 1 CAPSULE EVERY DAY 03/02/17   Plotnikov, Evie Lacks, MD  KLOR-CON 8 MEQ tablet TAKE 1 TABLET EVERY MORNING 03/19/17   Plotnikov, Evie Lacks, MD  loratadine (CLARITIN) 10 MG tablet Take 1 tablet (10 mg total) by mouth every morning. 06/29/16   Plotnikov, Evie Lacks, MD  midodrine (PROAMATINE) 10 MG tablet TAKE 1 TABLET (10 MG TOTAL) BY MOUTH 3 (THREE) TIMES DAILY. 08/24/16   Plotnikov, Evie Lacks, MD  Omega-3 Fatty Acids (FISH OIL) 1000 MG CAPS Take 1 capsule by mouth every morning.     [provider]  omeprazole (PRILOSEC) 20 MG capsule Take 2 capsules (40 mg total) by mouth daily. 06/29/16   Plotnikov, Evie Lacks, MD  pyridostigmine (MESTINON) 60 MG tablet Take 1 tablet (60 mg total) by mouth 3 (three) times daily. 03/17/17   Patel, Donika K, DO  traMADol (ULTRAM) 50 MG tablet TAKE 1/2 TO 1 TABLET BY MOUTH EVERY 6 HOURS AS NEEDED FOR PAIN 01/10/17   Plotnikov, Evie Lacks, MD     Allergies:     Allergies  Allergen Reactions  . Atorvastatin     REACTION: upset stomach  . Enalapril Maleate   . Hctz [Hydrochlorothiazide]     Dizzy   . Lovastatin     REACTION: tongue stress  . Simvastatin     REACTION: mouth sores  . Topamax [Topiramate]     syncope     Physical Exam:   Vitals  Blood pressure (!) 160/77,  pulse 75, temperature 98 F (36.7 C), temperature source Oral, resp. rate 19, SpO2 94 %.  Physical Examination: General appearance - alert, well appearing, and in no distress  Mental status - alert, oriented to person, place, and time,  Eyes - sclera anicteric Neck - supple, no JVD elevation , Chest - clear  to auscultation bilaterally, symmetrical air movement,  Heart - S1 and S2 normal, orthostatic vitals noted Abdomen - soft, nontender, nondistended, no masses or organomegaly Neurological - screening mental status exam normal, neck supple without rigidity, cranial nerves II through XII intact, DTR's normal and symmetric, gait is very unsteady due to orthostatic symptoms, neuro exam is limited due to severe orthostatic symptoms Extremities - Varicose/Spider Veins of the lower extremities noted , trace pedal edema noted, intact peripheral pulses  Skin - warm, dry    Data Review:    CBC  Recent Labs Lab 03/19/17 1354  WBC 6.6  HGB 13.7  HCT 42.3  PLT 157  MCV 91.0  MCH 29.5  MCHC 32.4  RDW 13.2  LYMPHSABS 2.0  MONOABS 0.6  EOSABS 0.2  BASOSABS 0.1   ------------------------------------------------------------------------------------------------------------------  Chemistries   Recent Labs Lab 03/19/17 1354  NA 136  K 4.3  CL 101  CO2 29  GLUCOSE 99  BUN 13  CREATININE 0.94  CALCIUM 8.9  AST 23  ALT 16  ALKPHOS 58  BILITOT 0.7   ------------------------------------------------------------------------------------------------------------------ estimated creatinine clearance is 54.2 mL/min (by C-G formula based on SCr of 0.94 mg/dL). ------------------------------------------------------------------------------------------------------------------ No results for input(s): TSH, T4TOTAL,  T3FREE, THYROIDAB in the last 72 hours.  Invalid input(s): FREET3   Coagulation profile No results for input(s): INR, PROTIME in the last 168  hours. ------------------------------------------------------------------------------------------------------------------- No results for input(s): DDIMER in the last 72 hours. -------------------------------------------------------------------------------------------------------------------  Cardiac Enzymes  Recent Labs Lab 03/19/17 1354  TROPONINI <0.03   ------------------------------------------------------------------------------------------------------------------ No results found for: BNP   ---------------------------------------------------------------------------------------------------------------  Urinalysis    Component Value Date/Time   COLORURINE STRAW (A) 03/19/2017 1304   APPEARANCEUR CLEAR 03/19/2017 1304   LABSPEC 1.004 (L) 03/19/2017 1304   PHURINE 7.0 03/19/2017 1304   GLUCOSEU NEGATIVE 03/19/2017 1304   GLUCOSEU NEGATIVE 06/26/2015 0930   HGBUR NEGATIVE 03/19/2017 1304   BILIRUBINUR NEGATIVE 03/19/2017 1304   KETONESUR NEGATIVE 03/19/2017 1304   PROTEINUR NEGATIVE 03/19/2017 1304   UROBILINOGEN 0.2 06/26/2015 0930   NITRITE NEGATIVE 03/19/2017 1304   LEUKOCYTESUR NEGATIVE 03/19/2017 1304    ----------------------------------------------------------------------------------------------------------------   Imaging Results:    Dg Lumbar Spine Complete  Result Date: 03/19/2017 CLINICAL DATA:  Weakness with back pain. Multiple falls over the past 2 days. Initial encounter. 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 Negative for acute fracture or traumatic malalignment. Lower lumbar facet arthropathy most notable at L4-5 where there is grade 1 anterolisthesis. Generalized spondylosis and degenerative disc narrowing, greatest at L4-5. Osteopenia. Atherosclerosis. IMPRESSION: 1. No acute finding. 2. Lumbar spine degeneration with L4-5  anterolisthesis. Electronically Signed   By: Monte Fantasia M.D.   On: 03/19/2017 13:40   Ct Head Wo Contrast  Result Date: 03/19/2017 CLINICAL DATA:  Fall last night. High clinical risk. Initial encounter. EXAM: CT HEAD WITHOUT CONTRAST CT CERVICAL SPINE WITHOUT CONTRAST TECHNIQUE: Multidetector CT imaging of the head and cervical spine was performed following the standard protocol without intravenous contrast. Multiplanar CT image reconstructions of the cervical spine were also generated. COMPARISON:  Brain MRI 03/02/2016. Head and cervical spine CT 05/10/2010 FINDINGS: CT HEAD FINDINGS Brain: No evidence of acute infarction, hemorrhage, hydrocephalus, extra-axial collection or mass lesion/mass effect. Atrophy with ventriculomegaly. Mild for age chronic small vessel ischemia. Vascular: Atherosclerotic calcification.  No hyperdense vessel. Skull: Negative for fracture. Sinuses/Orbits: No evidence of injury CT CERVICAL SPINE FINDINGS Alignment: No traumatic malalignment. Degenerative reversal of cervical lordosis that is progressed from 2011. Mild C7-T1 anterolisthesis. Skull base and vertebrae: Negative for fracture. Soft tissues and spinal canal: No prevertebral fluid or swelling. No visible canal hematoma. Bilateral thyroid nodules measuring up to 1 cm, incidental based on size and demographics. Disc levels:  Diffuse disc and facet degeneration. Upper chest: No acute finding. IMPRESSION: No evidence of acute intracranial or cervical spine injury. Electronically Signed   By: Monte Fantasia M.D.   On: 03/19/2017 14:06   Ct Cervical Spine Wo Contrast  Result Date: 03/19/2017 CLINICAL DATA:  Fall last night. High clinical risk. Initial encounter. EXAM: CT HEAD WITHOUT CONTRAST CT CERVICAL SPINE WITHOUT CONTRAST TECHNIQUE: Multidetector CT imaging of the head and cervical spine was performed following the standard protocol without intravenous contrast. Multiplanar CT image reconstructions of the cervical  spine were also generated. COMPARISON:  Brain MRI 03/02/2016. Head and cervical spine CT 05/10/2010 FINDINGS: CT HEAD FINDINGS Brain: No evidence of acute infarction, hemorrhage, hydrocephalus, extra-axial collection or mass lesion/mass effect. Atrophy with ventriculomegaly. Mild for age chronic small vessel ischemia. Vascular: Atherosclerotic calcification.  No hyperdense vessel. Skull: Negative for fracture. Sinuses/Orbits: No evidence of injury CT CERVICAL SPINE FINDINGS  Alignment: No traumatic malalignment. Degenerative reversal of cervical lordosis that is progressed from 2011. Mild C7-T1 anterolisthesis. Skull base and vertebrae: Negative for fracture. Soft tissues and spinal canal: No prevertebral fluid or swelling. No visible canal hematoma. Bilateral thyroid nodules measuring up to 1 cm, incidental based on size and demographics. Disc levels:  Diffuse disc and facet degeneration. Upper chest: No acute finding. IMPRESSION: No evidence of acute intracranial or cervical spine injury. Electronically Signed   By: Monte Fantasia M.D.   On: 03/19/2017 14:06    Radiological Exams on Admission: Dg Lumbar Spine Complete  Result Date: 03/19/2017 CLINICAL DATA:  Weakness with back pain. Multiple falls over the past 2 days. Initial encounter. 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 Negative for acute fracture or traumatic malalignment. Lower lumbar facet arthropathy most notable at L4-5 where there is grade 1 anterolisthesis. Generalized spondylosis and degenerative disc narrowing, greatest at L4-5. Osteopenia. Atherosclerosis. IMPRESSION: 1. No acute finding. 2. Lumbar spine degeneration with L4-5 anterolisthesis. Electronically Signed   By: Monte Fantasia M.D.   On: 03/19/2017 13:40   Ct Head Wo Contrast  Result Date: 03/19/2017 CLINICAL DATA:  Fall last night. High clinical risk.  Initial encounter. EXAM: CT HEAD WITHOUT CONTRAST CT CERVICAL SPINE WITHOUT CONTRAST TECHNIQUE: Multidetector CT imaging of the head and cervical spine was performed following the standard protocol without intravenous contrast. Multiplanar CT image reconstructions of the cervical spine were also generated. COMPARISON:  Brain MRI 03/02/2016. Head and cervical spine CT 05/10/2010 FINDINGS: CT HEAD FINDINGS Brain: No evidence of acute infarction, hemorrhage, hydrocephalus, extra-axial collection or mass lesion/mass effect. Atrophy with ventriculomegaly. Mild for age chronic small vessel ischemia. Vascular: Atherosclerotic calcification.  No hyperdense vessel. Skull: Negative for fracture. Sinuses/Orbits: No evidence of injury CT CERVICAL SPINE FINDINGS Alignment: No traumatic malalignment. Degenerative reversal of cervical lordosis that is progressed from 2011. Mild C7-T1 anterolisthesis. Skull base and vertebrae: Negative for fracture. Soft tissues and spinal canal: No prevertebral fluid or swelling. No visible canal hematoma. Bilateral thyroid nodules measuring up to 1 cm, incidental based on size and demographics. Disc levels:  Diffuse disc and facet degeneration. Upper chest: No acute finding. IMPRESSION: No evidence of acute intracranial or cervical spine injury. Electronically Signed   By: Monte Fantasia M.D.   On: 03/19/2017 14:06   Ct Cervical Spine Wo Contrast  Result Date: 03/19/2017 CLINICAL DATA:  Fall last night. High clinical risk. Initial encounter. EXAM: CT HEAD WITHOUT CONTRAST CT CERVICAL SPINE WITHOUT CONTRAST TECHNIQUE: Multidetector CT imaging of the head and cervical spine was performed following the standard protocol without intravenous contrast. Multiplanar CT image reconstructions of the cervical spine were also generated. COMPARISON:  Brain MRI 03/02/2016. Head and cervical spine CT 05/10/2010 FINDINGS: CT HEAD FINDINGS Brain: No evidence of acute infarction, hemorrhage, hydrocephalus,  extra-axial collection or mass lesion/mass effect. Atrophy with ventriculomegaly. Mild for age chronic small vessel ischemia. Vascular: Atherosclerotic calcification.  No hyperdense vessel. Skull: Negative for fracture. Sinuses/Orbits: No evidence of injury CT CERVICAL SPINE FINDINGS Alignment: No traumatic malalignment. Degenerative reversal of cervical lordosis that is progressed from 2011. Mild C7-T1 anterolisthesis. Skull base and vertebrae: Negative for fracture. Soft tissues and spinal canal: No prevertebral fluid or swelling. No visible canal hematoma. Bilateral thyroid nodules measuring up to 1 cm, incidental based on size and demographics. Disc levels:  Diffuse disc and facet degeneration. Upper chest: No acute finding.  IMPRESSION: No evidence of acute intracranial or cervical spine injury. Electronically Signed   By: Monte Fantasia M.D.   On: 03/19/2017 14:06    DVT Prophylaxis -SCD  /heparin AM Labs Ordered, also please review Full Orders  Family Communication: Admission, patients condition and plan of care including tests being ordered have been discussed with the patient and husband and daughter at bedside who indicate understanding and agree with the plan   Code Status - Full Code  Likely DC to  assisted living versus home with home health  Condition   Stable  EMOKPAE, COURAGE M.D on 03/19/2017 at 4:42 PM   Between 7am to 7pm - Pager - [phone] After 7pm go to [email or web address] - password [email or web address]  [facility] - Office  [phone]  Voice Recognition Viviann Spare dictation system was used to create this note, attempts have been made to correct errors. Please contact the author with questions and/or clarifications.

## 2017-03-19 NOTE — ED Notes (Signed)
Pt not ambulated at this time. Pt orthostatic, BP dropped to 56/17 when standing, pt pale, and dizzy. Pt placed back in bed. BP back up to 160/77 while lying in the bed. EDP made aware.

## 2017-03-19 NOTE — ED Provider Notes (Addendum)
[facility] EMERGENCY DEPARTMENT Provider Note   CSN: [id number] Arrival date & time: 03/19/17  1158     History   Chief Complaint Chief Complaint  Patient presents with  . Fall    HPI Sabrina Page is a 83 y.o. female.  HPI Patient presents with falls at home.  Has been weaker over the last few days.  Reported last night fell and was laying half on the bed half out and could not get up.  Her husband helped her.  Does have a history of orthostatic hypotension.  Medicines have been adjusted.  Also has been more weak in her legs than normal.  States some arm weakness but usually not this severe.  No headache.  No neck pain.  Has had good appetite.  Patient speaks [place] primarily and was translated by family members.  No dysuria.  Patient reportedly has some chronic back pain also that reportedly cannot be operated on.  Patient's daughter works home health and states that patient became a 2+ assist which was not usual for her.  Also states that her strength was not as strong in her legs as baseline. Past Medical History:  Diagnosis Date  . Allergy    rhinitis  . Asthma   . Chronic kidney disease    hx of cystitis   . COPD (chronic obstructive pulmonary disease) ([place])   . Depression   . Dizziness    vertigo   . Gallstones   . GERD (gastroesophageal reflux disease)   . Hyperlipidemia   . Hypertension   . Obesity   . Osteoarthritis   . Osteoporosis   . Peripheral vascular disease ([facility])    swelling   . Pneumonia   . Shortness of breath    with exertion   . Shoulder injury    left  . Sleep apnea    never diagnosed   . Urinary incontinence     Patient Active Problem List   Diagnosis Date Noted  . Knee pain, right 12/27/2016  . Fall   . Closed right hip fracture ([place]) 04/15/2016  . Hip fracture ([place]) 04/15/2016  . Syncope due to orthostatic hypotension 01/18/2016  . Orthostatic hypotension 01/18/2016  . Hip pain, chronic 12/25/2015  .  Carotid bruit 06/26/2015  . Fall at home 02/01/2015  . Concussion without loss of consciousness 09/13/2014  . Morbid obesity ([place]) 03/06/2014  . Right tennis elbow 01/02/2014  . Wart 01/02/2014  . Chronic fatigue 06/25/2013  . External hemorrhoid, bleeding 02/14/2013  . OSA on CPAP 02/10/2012  . Low back pain radiating to both legs 05/14/2011  . Falls frequently 05/14/2011  . Fatigue 04/22/2011  . Hyperglycemia 04/22/2011  . Neoplasm of uncertain behavior of skin 01/11/2011  . Anxiety state 07/06/2010  . SHOULDER PAIN 05/21/2010  . CONCUSSION WITH LOSS OF CONSCIOUSNESS 05/21/2010  . Actinic keratosis 04/08/2010  . CARPAL TUNNEL SYNDROME 11/28/2009  . NECK PAIN 11/28/2009  . DIZZINESS 11/28/2009  . INSOMNIA, CHRONIC 06/23/2009  . TOBACCO USE, QUIT 04/11/2009  . DIVERTICULOSIS OF COLON 09/06/2008  . DIVERTICULITIS OF COLON 09/06/2008  . Unspecified chest pain 09/06/2008  . GERD 06/05/2008  . OSTEOARTHRITIS 06/05/2008  . ABNORMAL GLUCOSE NEC 06/05/2008  . BRONCHITIS, ACUTE 04/04/2008  . APHTHOUS STOMATITIS 03/25/2008  . Venous (peripheral) insufficiency 11/03/2007  . Edema 11/03/2007  . SHINGLES 10/23/2007  . DENTAL PAIN 10/23/2007  . PARESTHESIA 10/23/2007  . Pain in limb 07/06/2007  . HYPERLIPIDEMIA 02/17/2007  . Depression 02/17/2007  . Essential  hypertension 02/17/2007  . ALLERGIC RHINITIS 02/17/2007  . Asthma, moderate persistent, well-controlled 02/17/2007  . Dyspnea on exertion 02/17/2007  . SYMPTOM, INCONTINENCE, URGE 02/17/2007    Past Surgical History:  Procedure Laterality Date  . CHOLECYSTECTOMY  09/14/2011   Procedure: LAPAROSCOPIC CHOLECYSTECTOMY;  Surgeon: Odis Hollingshead, MD;  Location: [facility];  Service: General;  Laterality: N/A;  attempted intraoperative cholangiogram   . HIP ARTHROPLASTY Right 04/17/2016   Procedure: ARTHROPLASTY BIPOLAR HIP (HEMIARTHROPLASTY);  Surgeon: Paralee Cancel, MD;  Location: [facility];  Service: Orthopedics;  Laterality: Right;   . L TKR    . LIVER BIOPSY  09/14/2011   Procedure: LIVER BIOPSY;  Surgeon: Odis Hollingshead, MD;  Location: [facility];  Service: General;  Laterality: N/A;  . OTHER SURGICAL HISTORY     left leg surgery   . OTHER SURGICAL HISTORY     left breast surgery due to mastitis   . right left knee arthroplast      OB History    No data available       Home Medications    Prior to Admission medications   Medication Sig Start Date End Date Taking? Authorizing Provider  aspirin 81 MG EC tablet Take 1 tablet (81 mg total) by mouth 2 (two) times daily. 07/19/16   Plotnikov, Evie Lacks, MD  B Complex-C (SUPER B COMPLEX PO) Take 1 tablet by mouth every morning.     [provider]  Cholecalciferol 1000 UNITS capsule Take 1,000 Units by mouth every morning.     [provider]  clonazePAM (KLONOPIN) 0.5 MG tablet TAKE 1 TABLET BY MOUTH TWICE A DAY AS NEEDED FOR ANXIETY 03/02/17   Plotnikov, Evie Lacks, MD  doxycycline (VIBRA-TABS) 100 MG tablet Take 1 tablet (100 mg total) by mouth 2 (two) times daily. Patient not taking: Reported on 02/10/2017 01/09/17   Robyn Haber, MD  FLUoxetine (PROZAC) 40 MG capsule Take 1 capsule (40 mg total) by mouth daily. 06/29/16   Plotnikov, Evie Lacks, MD  FLUoxetine (PROZAC) 40 MG capsule TAKE 1 CAPSULE EVERY DAY 03/02/17   Plotnikov, Evie Lacks, MD  KLOR-CON 8 MEQ tablet TAKE 1 TABLET EVERY MORNING 03/19/17   Plotnikov, Evie Lacks, MD  loratadine (CLARITIN) 10 MG tablet Take 1 tablet (10 mg total) by mouth every morning. 06/29/16   Plotnikov, Evie Lacks, MD  midodrine (PROAMATINE) 10 MG tablet TAKE 1 TABLET (10 MG TOTAL) BY MOUTH 3 (THREE) TIMES DAILY. 08/24/16   Plotnikov, Evie Lacks, MD  Omega-3 Fatty Acids (FISH OIL) 1000 MG CAPS Take 1 capsule by mouth every morning.     [provider]  omeprazole (PRILOSEC) 20 MG capsule Take 2 capsules (40 mg total) by mouth daily. 06/29/16   Plotnikov, Evie Lacks, MD  pyridostigmine (MESTINON) 60 MG tablet Take 1  tablet (60 mg total) by mouth 3 (three) times daily. 03/17/17   Patel, Donika K, DO  traMADol (ULTRAM) 50 MG tablet TAKE 1/2 TO 1 TABLET BY MOUTH EVERY 6 HOURS AS NEEDED FOR PAIN 01/10/17   Plotnikov, Evie Lacks, MD    Family History Family History  Problem Relation Age of Onset  . Hypertension Mother   . Arthritis Mother   . Diabetes Mother   . Stroke Mother   . Stroke Father   . Hypertension Father   . Hypertension Other   . CAD Neg Hx     Social History Social History  Substance Use Topics  . Smoking status: Former Smoker  Packs/day: 0.50    Years: 35.00    Types: Cigarettes    Quit date: 05/24/2001  . Smokeless tobacco: Never Used  . Alcohol use Yes     Comment: socially      Allergies   Atorvastatin; Enalapril maleate; Hctz [hydrochlorothiazide]; Lovastatin; Simvastatin; and Topamax [topiramate]   Review of Systems Review of Systems  Constitutional: Negative for appetite change.  HENT: Negative for congestion.   Respiratory: Negative for shortness of breath.   Cardiovascular: Negative for chest pain.  Gastrointestinal: Negative for abdominal pain.  Genitourinary: Negative for flank pain.  Musculoskeletal: Positive for back pain.  Skin: Negative for rash.  Neurological: Positive for weakness. Negative for headaches.  Hematological: Negative for adenopathy.     Physical Exam Updated Vital Signs BP (!) 160/77   Pulse 75   Temp 98 F (36.7 C) (Oral)   Resp 19   SpO2 94%   Physical Exam  Constitutional: She is oriented to person, place, and time. She appears well-developed.  HENT:  Head: Atraumatic.  Eyes: Pupils are equal, round, and reactive to light.  Neck: Neck supple.  Cardiovascular: Normal rate.   Pulmonary/Chest: Effort normal.  Abdominal: Soft.  Musculoskeletal: She exhibits no tenderness.   mild lumbar tenderness.    Neurological: She is alert and oriented to person, place, and time.  Good grip strength bilateral.  Good flexion extension  in bilateral upper extremities.  Decent straight leg raise bilaterally.  No clonus in her lower extremities.  Strength reportedly not quite at her baseline on her lower extremities.  Skin: Skin is warm. Capillary refill takes less than 2 seconds.     ED Treatments / Results  Labs (all labs ordered are listed, but only abnormal results are displayed) Labs Reviewed  URINALYSIS, ROUTINE W REFLEX MICROSCOPIC - Abnormal; Notable for the following:       Result Value   Color, Urine STRAW (*)    Specific Gravity, Urine 1.004 (*)    All other components within normal limits  COMPREHENSIVE METABOLIC PANEL - Abnormal; Notable for the following:    Albumin 3.4 (*)    GFR calc non Af Amer 55 (*)    All other components within normal limits  CBC WITH DIFFERENTIAL/PLATELET  TROPONIN I    EKG  EKG Interpretation  Date/Time:  Saturday March 19 2017 12:16:41 EDT Ventricular Rate:  79 PR Interval:    QRS Duration: 85 QT Interval:  406 QTC Calculation: 466 R Axis:   7 Text Interpretation:  Sinus rhythm Abnormal R-wave progression, early transition Confirmed by Davonna Belling [phone]) on 03/19/2017 3:34:05 PM       Radiology Dg Lumbar Spine Complete  Result Date: 03/19/2017 CLINICAL DATA:  Weakness with back pain. Multiple falls over the past 2 days. Initial encounter. 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 Negative for acute fracture or traumatic malalignment. Lower lumbar facet arthropathy most notable at L4-5 where there is grade 1 anterolisthesis. Generalized spondylosis and degenerative disc narrowing, greatest at L4-5. Osteopenia. Atherosclerosis. IMPRESSION: 1. No acute finding. 2. Lumbar spine degeneration with L4-5 anterolisthesis. Electronically Signed   By: Monte Fantasia M.D.   On: 03/19/2017 13:40   Ct Head Wo Contrast  Result Date: 03/19/2017 CLINICAL  DATA:  Fall last night. High clinical risk. Initial encounter. EXAM: CT HEAD WITHOUT CONTRAST CT CERVICAL SPINE WITHOUT CONTRAST TECHNIQUE: Multidetector CT imaging of the head and  cervical spine was performed following the standard protocol without intravenous contrast. Multiplanar CT image reconstructions of the cervical spine were also generated. COMPARISON:  Brain MRI 03/02/2016. Head and cervical spine CT 05/10/2010 FINDINGS: CT HEAD FINDINGS Brain: No evidence of acute infarction, hemorrhage, hydrocephalus, extra-axial collection or mass lesion/mass effect. Atrophy with ventriculomegaly. Mild for age chronic small vessel ischemia. Vascular: Atherosclerotic calcification.  No hyperdense vessel. Skull: Negative for fracture. Sinuses/Orbits: No evidence of injury CT CERVICAL SPINE FINDINGS Alignment: No traumatic malalignment. Degenerative reversal of cervical lordosis that is progressed from 2011. Mild C7-T1 anterolisthesis. Skull base and vertebrae: Negative for fracture. Soft tissues and spinal canal: No prevertebral fluid or swelling. No visible canal hematoma. Bilateral thyroid nodules measuring up to 1 cm, incidental based on size and demographics. Disc levels:  Diffuse disc and facet degeneration. Upper chest: No acute finding. IMPRESSION: No evidence of acute intracranial or cervical spine injury. Electronically Signed   By: Monte Fantasia M.D.   On: 03/19/2017 14:06   Ct Cervical Spine Wo Contrast  Result Date: 03/19/2017 CLINICAL DATA:  Fall last night. High clinical risk. Initial encounter. EXAM: CT HEAD WITHOUT CONTRAST CT CERVICAL SPINE WITHOUT CONTRAST TECHNIQUE: Multidetector CT imaging of the head and cervical spine was performed following the standard protocol without intravenous contrast. Multiplanar CT image reconstructions of the cervical spine were also generated. COMPARISON:  Brain MRI 03/02/2016. Head and cervical spine CT 05/10/2010 FINDINGS: CT HEAD FINDINGS Brain: No evidence of  acute infarction, hemorrhage, hydrocephalus, extra-axial collection or mass lesion/mass effect. Atrophy with ventriculomegaly. Mild for age chronic small vessel ischemia. Vascular: Atherosclerotic calcification.  No hyperdense vessel. Skull: Negative for fracture. Sinuses/Orbits: No evidence of injury CT CERVICAL SPINE FINDINGS Alignment: No traumatic malalignment. Degenerative reversal of cervical lordosis that is progressed from 2011. Mild C7-T1 anterolisthesis. Skull base and vertebrae: Negative for fracture. Soft tissues and spinal canal: No prevertebral fluid or swelling. No visible canal hematoma. Bilateral thyroid nodules measuring up to 1 cm, incidental based on size and demographics. Disc levels:  Diffuse disc and facet degeneration. Upper chest: No acute finding. IMPRESSION: No evidence of acute intracranial or cervical spine injury. Electronically Signed   By: Monte Fantasia M.D.   On: 03/19/2017 14:06    Procedures Procedures (including critical care time)  Medications Ordered in ED Medications  sodium chloride 0.9 % bolus 500 mL (not administered)     Initial Impression / Assessment and Plan / ED Course  I have reviewed the triage vital signs and the nursing notes.  Pertinent labs & imaging results that were available during my care of the patient were reviewed by me and considered in my medical decision making (see chart for details).     Patient with generalized weakness.  Has a history of orthostasis but here patient's pressure went from 440 systolic down to 50 with standing.  No sign of infection. at this point cannot ambulate.Marland Kitchen  Has had some medication adjustment.  Will give some IV hydration. will admit to hospitalist.  Final Clinical Impressions(s) / ED Diagnoses   Final diagnoses:  Orthostatic hypotension    New Prescriptions New Prescriptions   No medications on file     Davonna Belling, MD 03/19/17 1518    Davonna Belling, MD 03/19/17 1027      Davonna Belling, MD 03/19/17 1535

## 2017-03-20 DIAGNOSIS — M810 Age-related osteoporosis without current pathological fracture: Secondary | ICD-10-CM | POA: Diagnosis present

## 2017-03-20 DIAGNOSIS — I110 Hypertensive heart disease with heart failure: Secondary | ICD-10-CM | POA: Diagnosis present

## 2017-03-20 DIAGNOSIS — M6281 Muscle weakness (generalized): Secondary | ICD-10-CM | POA: Diagnosis not present

## 2017-03-20 DIAGNOSIS — R55 Syncope and collapse: Secondary | ICD-10-CM | POA: Diagnosis not present

## 2017-03-20 DIAGNOSIS — Z7982 Long term (current) use of aspirin: Secondary | ICD-10-CM | POA: Diagnosis not present

## 2017-03-20 DIAGNOSIS — E785 Hyperlipidemia, unspecified: Secondary | ICD-10-CM | POA: Diagnosis present

## 2017-03-20 DIAGNOSIS — I739 Peripheral vascular disease, unspecified: Secondary | ICD-10-CM | POA: Diagnosis present

## 2017-03-20 DIAGNOSIS — I5032 Chronic diastolic (congestive) heart failure: Secondary | ICD-10-CM | POA: Diagnosis present

## 2017-03-20 DIAGNOSIS — Z87891 Personal history of nicotine dependence: Secondary | ICD-10-CM | POA: Diagnosis not present

## 2017-03-20 DIAGNOSIS — R41841 Cognitive communication deficit: Secondary | ICD-10-CM | POA: Diagnosis not present

## 2017-03-20 DIAGNOSIS — G903 Multi-system degeneration of the autonomic nervous system: Secondary | ICD-10-CM | POA: Diagnosis not present

## 2017-03-20 DIAGNOSIS — F329 Major depressive disorder, single episode, unspecified: Secondary | ICD-10-CM | POA: Diagnosis present

## 2017-03-20 DIAGNOSIS — E669 Obesity, unspecified: Secondary | ICD-10-CM | POA: Diagnosis present

## 2017-03-20 DIAGNOSIS — I951 Orthostatic hypotension: Secondary | ICD-10-CM | POA: Diagnosis not present

## 2017-03-20 DIAGNOSIS — I959 Hypotension, unspecified: Secondary | ICD-10-CM | POA: Diagnosis not present

## 2017-03-20 DIAGNOSIS — J449 Chronic obstructive pulmonary disease, unspecified: Secondary | ICD-10-CM | POA: Diagnosis present

## 2017-03-20 DIAGNOSIS — G4730 Sleep apnea, unspecified: Secondary | ICD-10-CM | POA: Diagnosis present

## 2017-03-20 DIAGNOSIS — Z79899 Other long term (current) drug therapy: Secondary | ICD-10-CM | POA: Diagnosis not present

## 2017-03-20 DIAGNOSIS — I10 Essential (primary) hypertension: Secondary | ICD-10-CM | POA: Diagnosis not present

## 2017-03-20 DIAGNOSIS — R488 Other symbolic dysfunctions: Secondary | ICD-10-CM | POA: Diagnosis not present

## 2017-03-20 DIAGNOSIS — R42 Dizziness and giddiness: Secondary | ICD-10-CM | POA: Diagnosis not present

## 2017-03-20 DIAGNOSIS — Z888 Allergy status to other drugs, medicaments and biological substances status: Secondary | ICD-10-CM | POA: Diagnosis not present

## 2017-03-20 DIAGNOSIS — Z6835 Body mass index (BMI) 35.0-35.9, adult: Secondary | ICD-10-CM | POA: Diagnosis not present

## 2017-03-20 DIAGNOSIS — K219 Gastro-esophageal reflux disease without esophagitis: Secondary | ICD-10-CM | POA: Diagnosis present

## 2017-03-20 DIAGNOSIS — R296 Repeated falls: Secondary | ICD-10-CM | POA: Diagnosis present

## 2017-03-20 DIAGNOSIS — G4733 Obstructive sleep apnea (adult) (pediatric): Secondary | ICD-10-CM | POA: Diagnosis present

## 2017-03-20 LAB — BASIC METABOLIC PANEL
Anion gap: 7 (ref 5–15)
BUN: 11 mg/dL (ref 6–20)
CO2: 28 mmol/L (ref 22–32)
Calcium: 8.9 mg/dL (ref 8.9–10.3)
Chloride: 102 mmol/L (ref 101–111)
Creatinine, Ser: 0.97 mg/dL (ref 0.44–1.00)
GFR calc Af Amer: >60 mL/min (ref >60)
GFR calc non Af Amer: 53 mL/min — ABNORMAL LOW (ref >60)
Glucose, Bld: 93 mg/dL (ref 65–99)
Potassium: 4.1 mmol/L (ref 3.5–5.1)
Sodium: 137 mmol/L (ref 135–145)

## 2017-03-20 LAB — CBC
HCT: 41.9 % (ref 36.0–46.0)
Hemoglobin: 13.4 g/dL (ref 12.0–15.0)
MCH: 29 pg (ref 26.0–34.0)
MCHC: 32 g/dL (ref 30.0–36.0)
MCV: 90.7 fL (ref 78.0–100.0)
Platelets: 148 10*3/uL — ABNORMAL LOW (ref 150–400)
RBC: 4.62 MIL/uL (ref 3.87–5.11)
RDW: 13.1 % (ref 11.5–15.5)
WBC: 5.8 10*3/uL (ref 4.0–10.5)

## 2017-03-20 MED ORDER — POTASSIUM CHLORIDE CRYS ER 10 MEQ PO TBCR
10.0000 meq | EXTENDED_RELEASE_TABLET | Freq: Every morning | ORAL | Status: DC
  Administered 2017-03-20 – 2017-03-23 (×4): 10 meq via ORAL
  Filled 2017-03-20 (×4): qty 1

## 2017-03-20 NOTE — Evaluation (Addendum)
Physical Therapy Evaluation Patient Details Name: Sabrina Page MRN: [id number] DOB: Aug 08, 1933 Today's Date: 03/20/2017   History of Present Illness  Pt is an 83 y.o. female admitted for orthostatic hypotension.  PMH consists of ashtma, GERD, depression, hyperlipidemia, CKD, COPD, multi falls, HTN and peripheral vascular disease.  Clinical Impression  Pt admitted with above diagnosis. Pt currently with functional limitations due to the deficits listed below (see PT Problem List). On eval, pt required mod assist bed mobility and mod assist transfers. Unable to progress ambulation due to weakness and fatigue. Orthostatic BP as follows: supine 138/68 (HR 81), sitting 90/62 (HR 91), and standing 100/68 (HR 96).  Pt with c/o mild dizziness during stance. Her chief complaint was weakness, with trembling noted from fatigue. Min assist required to maintain stance. Pt will benefit from skilled PT to increase their independence and safety with mobility to allow discharge to the venue listed below.  Recommend ST SNF at d/c for further therapy with long term goal of ALF.     Follow Up Recommendations SNF    Equipment Recommendations  None recommended by PT    Recommendations for Other Services       Precautions / Restrictions Precautions Precautions: Fall;Other (comment) Precaution Comments: watch BP      Mobility  Bed Mobility Overal bed mobility: Needs Assistance Bed Mobility: Supine to Sit;Sit to Supine     Supine to sit: Mod assist Sit to supine: Mod assist   General bed mobility comments: +rail, increased time and effort  Transfers Overall transfer level: Needs assistance Equipment used: Rolling walker (2 wheeled) Transfers: Sit to/from Stand Sit to Stand: Mod assist         General transfer comment: verbal cues for hand placement. Assist to power up.  Ambulation/Gait             General Gait Details: unable due to weakness/orthostatic.  Stairs             Wheelchair Mobility    Modified Rankin (Stroke Patients Only)       Balance Overall balance assessment: Needs assistance Sitting-balance support: No upper extremity supported;Feet supported Sitting balance-Leahy Scale: Good     Standing balance support: Bilateral upper extremity supported Standing balance-Leahy Scale: Poor Standing balance comment: heavy reliance on RW and physical assist.                             Pertinent Vitals/Pain Pain Assessment: Faces Faces Pain Scale: Hurts little more Pain Location: back Pain Descriptors / Indicators: Grimacing;Guarding Pain Intervention(s): Monitored during session;Repositioned    Home Living Family/patient expects to be discharged to:: Private residence Living Arrangements: Spouse/significant other Available Help at Discharge: Available PRN/intermittently;Personal care attendant Type of Home: Apartment Home Access: Level entry;Elevator     Home Layout: One level Home Equipment: Environmental consultant - 4 wheels;Wheelchair - manual;Shower seat - built in;Bedside commode      Prior Function Level of Independence: Needs assistance   Gait / Transfers Assistance Needed: Ambulates mod I with RW.  ADL's / Homemaking Assistance Needed: PCA 2.5 hours/day to assist with ADLs  Comments: multi falls     Hand Dominance        Extremity/Trunk Assessment   Upper Extremity Assessment Upper Extremity Assessment: Generalized weakness    Lower Extremity Assessment Lower Extremity Assessment: Generalized weakness    Cervical / Trunk Assessment Cervical / Trunk Assessment: Normal  Communication   Communication: Prefers language other than English (  Pt speaks [place]. Daughter interpreted. )  Cognition Arousal/Alertness: Awake/alert Behavior During Therapy: WFL for tasks assessed/performed Overall Cognitive Status: Within Functional Limits for tasks assessed                                         General Comments      Exercises     Assessment/Plan    PT Assessment Patient needs continued PT services  PT Problem List Decreased strength;Decreased activity tolerance;Decreased balance;Decreased mobility;Cardiopulmonary status limiting activity;Pain       PT Treatment Interventions DME instruction;Gait training;Functional mobility training;Therapeutic activities;Therapeutic exercise;Balance training;Patient/family education    PT Goals (Current goals can be found in the Care Plan section)  Acute Rehab PT Goals Patient Stated Goal: not stated PT Goal Formulation: With patient/family Time For Goal Achievement: 03/27/17 Potential to Achieve Goals: Fair    Frequency Min 3X/week   Barriers to discharge        Co-evaluation               AM-PAC PT "6 Clicks" Daily Activity  Outcome Measure Difficulty turning over in bed (including adjusting bedclothes, sheets and blankets)?: Unable Difficulty moving from lying on back to sitting on the side of the bed? : Unable Difficulty sitting down on and standing up from a chair with arms (e.g., wheelchair, bedside commode, etc,.)?: Unable Help needed moving to and from a bed to chair (including a wheelchair)?: A Lot Help needed walking in hospital room?: A Lot Help needed climbing 3-5 steps with a railing? : Total 6 Click Score: 8    End of Session Equipment Utilized During Treatment: Gait belt Activity Tolerance: Patient limited by fatigue;Treatment limited secondary to medical complications (Comment) (orthostatic) Patient left: in bed;with family/visitor present;with call bell/phone within reach Nurse Communication: Mobility status PT Visit Diagnosis: Repeated falls (R29.6);Muscle weakness (generalized) (M62.81);Other abnormalities of gait and mobility (R26.89)    Time: 7591-6384 PT Time Calculation (min) (ACUTE ONLY): 25 min   Charges:   PT Evaluation $PT Eval Moderate Complexity: 1 Mod PT Treatments $Therapeutic  Activity: 8-22 mins   PT G Codes:   PT G-Codes **NOT FOR INPATIENT CLASS** Functional Assessment Tool Used: AM-PAC 6 Clicks Basic Mobility Functional Limitation: Mobility: Walking and moving around Mobility: Walking and Moving Around Current Status (Y6599): At least 80 percent but less than 100 percent impaired, limited or restricted Mobility: Walking and Moving Around Goal Status [phone]): At least 60 percent but less than 80 percent impaired, limited or restricted    Lorrin Goodell, PT  Office # [phone] Pager [phone]   Lorriane Shire 03/20/2017, 1:33 PM

## 2017-03-20 NOTE — Progress Notes (Signed)
PROGRESS NOTE    Sabrina Page  UKG:[id number] DOB: 1933-10-09 DOA: 03/19/2017 PCP: Cassandria Anger, MD   Specialists:   Dr Posey Pronto of Neurology  Brief Narrative:   10 ? Known severe orthostatic hypotension on Midodrine, Mestinon-consideration for droxidopa last OV  Multiple falls about 6 times this year--hip fracture 03/2016 ?  Cognitive decline with delusions-MRI showed global atrophy Grade 2 diastolic heart failure, EF 8/17 =55-60% Obstructive sleep apnea supposed to be on CPAP BMI >35 ?  Asthma/COPD  Admitted with falls secondary to severe orthostasis with blood pressures in the 60s, CT head without acute findings nor CT spine L-spine also negative other labs largely unremarkable   Assessment & Plan:   Principal Problem:   Orthostatic hypotension Active Problems:   Essential hypertension   DIZZINESS   OSA on CPAP   Fall at home   Severe orthostasis  Will probably need outpatient therapy and management  Place TED hose  Initiate therapy input and they are recommending skilled care  Continue IV saline 75 cc/h for now  She has been purposely limiting her p.o. fluid because she does not want to get up to urinate given fear of falling--I have encouraged her today to drink more liquid as well as eat a regular salt diet  Grade 2 diastolic heart failure EF 8/17 55-60%  Would not be able to tolerate any ACE or beta-blocker secondary to hypotension  Monitor only  Obstructive sleep apnea supposed to be on CPAP  Will inquire and order the same needed  BMI >35  ?Asthma/COPD  Lovenox Presumed full code Inpatient Discussed with daughter at the bedside in detail   Consultants:   Neurology  Procedures:   None yet  Antimicrobials:   None   Subjective: And history with the help of her daughter who translated  Complains of chronic low back as well as extremity pain but these have been ongoing Blood pressures were a little bit better today she was  able to move around a little bit more but still feels very weak somewhat debilitated and still dizzy Note that her orthostatic vital signs usually go from 120 supine or seated to 90 systolic and that is where we are today    Objective: Vitals:   03/19/17 1756 03/19/17 2055 03/19/17 2205 03/20/17 0433  BP: (!) 133/113 (!) 116/50  (!) 143/69  Pulse: 68 71  75  Resp: 19 20 18    Temp: 97.9 F (36.6 C) 98.3 F (36.8 C)  98.3 F (36.8 C)  TempSrc: Oral Oral    SpO2: 96% 94%  99%    Intake/Output Summary (Last 24 hours) at 03/20/17 0803 Last data filed at 03/20/17 0630  Gross per 24 hour  Intake          1941.25 ml  Output              900 ml  Net          1041.25 ml   There were no vitals filed for this visit.  Examination:  Alert obese pleasant and in no distress External ocular movements intact, all 4 limbs move equally smile symmetric shoulder shrug is bilaterally equal Chest is clinically clear without added sounds Abdomen is soft nontender no rebound no guarding No lower extremity edema she has TED hose on S1-S2 no murmur Power is 5/5 There is exaggeration of the normal lumbar lordosis with a little bit of spasm  Data Reviewed: I have personally reviewed following labs and imaging studies  CBC:  Recent Labs Lab 03/19/17 1354 03/20/17 0241  WBC 6.6 5.8  NEUTROABS 3.7  --   HGB 13.7 13.4  HCT 42.3 41.9  MCV 91.0 90.7  PLT 157 448*   Basic Metabolic Panel:  Recent Labs Lab 03/19/17 1354 03/20/17 0241  NA 136 137  K 4.3 4.1  CL 101 102  CO2 29 28  GLUCOSE 99 93  BUN 13 11  CREATININE 0.94 0.97  CALCIUM 8.9 8.9   GFR: Estimated Creatinine Clearance: 52.5 mL/min (by C-G formula based on SCr of 0.97 mg/dL). Liver Function Tests:  Recent Labs Lab 03/19/17 1354  AST 23  ALT 16  ALKPHOS 58  BILITOT 0.7  PROT 6.6  ALBUMIN 3.4*   No results for input(s): LIPASE, AMYLASE in the last 168 hours. No results for input(s): AMMONIA in the last 168  hours. Coagulation Profile: No results for input(s): INR, PROTIME in the last 168 hours. Cardiac Enzymes:  Recent Labs Lab 03/19/17 1354  TROPONINI <0.03   BNP (last 3 results) No results for input(s): PROBNP in the last 8760 hours. HbA1C: No results for input(s): HGBA1C in the last 72 hours. CBG: No results for input(s): GLUCAP in the last 168 hours. Lipid Profile: No results for input(s): CHOL, HDL, LDLCALC, TRIG, CHOLHDL, LDLDIRECT in the last 72 hours. Thyroid Function Tests: No results for input(s): TSH, T4TOTAL, FREET4, T3FREE, THYROIDAB in the last 72 hours. Anemia Panel: No results for input(s): VITAMINB12, FOLATE, FERRITIN, TIBC, IRON, RETICCTPCT in the last 72 hours. Urine analysis:    Component Value Date/Time   COLORURINE STRAW (A) 03/19/2017 1304   APPEARANCEUR CLEAR 03/19/2017 1304   LABSPEC 1.004 (L) 03/19/2017 1304   PHURINE 7.0 03/19/2017 1304   GLUCOSEU NEGATIVE 03/19/2017 1304   GLUCOSEU NEGATIVE 06/26/2015 0930   HGBUR NEGATIVE 03/19/2017 1304   BILIRUBINUR NEGATIVE 03/19/2017 1304   KETONESUR NEGATIVE 03/19/2017 1304   PROTEINUR NEGATIVE 03/19/2017 1304   UROBILINOGEN 0.2 06/26/2015 0930   NITRITE NEGATIVE 03/19/2017 1304   LEUKOCYTESUR NEGATIVE 03/19/2017 1304     Radiology Studies: Reviewed images personally in health database    Scheduled Meds: . aspirin EC  81 mg Oral BID  . cholecalciferol  1,000 Units Oral q morning - 10a  . clonazePAM  0.5 mg Oral Daily  . FLUoxetine  40 mg Oral Daily  . heparin  5,000 Units Subcutaneous Q8H  . loratadine  10 mg Oral q morning - 10a  . midodrine  10 mg Oral TID WC  . omega-3 acid ethyl esters  1 g Oral Daily  . pantoprazole  40 mg Oral Daily  . potassium chloride  10 mEq Oral q morning - 10a  . pyridostigmine  60 mg Oral TID  . senna  1 tablet Oral BID  . sodium chloride flush  3 mL Intravenous Q12H   Continuous Infusions: . sodium chloride 75 mL/hr at 03/20/17 0647  . sodium chloride        LOS: 0 days    Time spent: [place], MD [facility]   If 7PM-7AM, please contact night-coverage [email or web address] Password [email or web address] 03/20/2017, 8:03 AM

## 2017-03-21 MED ORDER — FLUDROCORTISONE ACETATE 0.1 MG PO TABS
0.1000 mg | ORAL_TABLET | Freq: Every day | ORAL | Status: DC
  Administered 2017-03-21 – 2017-03-23 (×2): 0.1 mg via ORAL
  Filled 2017-03-21 (×3): qty 1

## 2017-03-21 MED ORDER — FLUDROCORTISONE ACETATE 0.1 MG PO TABS
0.1000 mg | ORAL_TABLET | Freq: Every day | ORAL | Status: DC
  Filled 2017-03-21: qty 1

## 2017-03-21 MED ORDER — ALUM & MAG HYDROXIDE-SIMETH 200-200-20 MG/5ML PO SUSP
30.0000 mL | Freq: Four times a day (QID) | ORAL | Status: DC | PRN
  Administered 2017-03-21: 30 mL via ORAL
  Filled 2017-03-21: qty 30

## 2017-03-21 NOTE — Progress Notes (Signed)
PROGRESS NOTE    Sabrina Page  OVZ:[id number] DOB: 16-Oct-1933 DOA: 03/19/2017 PCP: Cassandria Anger, MD   Specialists:   Dr Posey Pronto of Neurology  Brief Narrative:   89 ? Known severe orthostatic hypotension on Midodrine, Mestinon-consideration for droxidopa last OV  Multiple falls about 6 times this year--hip fracture 03/2016 ?  Cognitive decline with delusions-MRI showed global atrophy Grade 2 diastolic heart failure, EF 8/17 =55-60% Obstructive sleep apnea supposed to be on CPAP BMI >35 ?  Asthma/COPD  Admitted with falls secondary to severe orthostasis with blood pressures in the 60s, CT head without acute findings nor CT spine L-spine also negative other labs largely unremarkable   Assessment & Plan:   Principal Problem:   Orthostatic hypotension Active Problems:   Essential hypertension   DIZZINESS   OSA on CPAP   Fall at home   Orthostatic hypotension dysautonomic syndrome (HCC)   Severe orthostasis  Will probably need outpatient therapy and management  Place TED hose  Initiate therapy input and they are recommending skilled care  Continue IV saline 75 cc/h for now  Pt limiting p.o. fluid because she does not want to get up to urinate given fear of falling  I have d/w Dr. Posey Pronto her neurologist on 10/29 and we will start a low dose of Florinef and monitor for effect--much appreciate neurology guidance--will have to monitor for s/s of worsening chf in hopsital as florinef can ppt this  Add abdominal binder  Grade 2 diastolic heart failure EF 8/17 55-60%  Would not be able to tolerate any ACE or beta-blocker secondary to hypotension  See above discussion  Obstructive sleep apnea supposed to be on CPAP  Will inquire and order the same needed  BMI >35  ?Asthma/COPD  No issue currently  Lovenox Presumed full code Inpatient Discussed with daughter at the bedside in detail   Consultants:   Neurology  Procedures:   None  yet  Antimicrobials:   None   Subjective:  daughter who translated  Some dizzyness No cp Was able to get to Bedside commode with max assist Eating far Overall not much different than prior   Objective: Vitals:   03/20/17 2008 03/21/17 0400 03/21/17 1030 03/21/17 1301  BP:    (!) 145/61  Pulse:  67  66  Resp: 17     Temp: 98.3 F (36.8 C) 98.1 F (36.7 C)  98.4 F (36.9 C)  TempSrc:  Oral  Oral  SpO2:  100% 100% 95%    Intake/Output Summary (Last 24 hours) at 03/21/17 1333 Last data filed at 03/21/17 0946  Gross per 24 hour  Intake          2556.75 ml  Output             1450 ml  Net          1106.75 ml   There were no vitals filed for this visit.  Examination:  Alert obese pleasant and in no distress External ocular movements intact, power good 4 ext, smile symmetric Chest is clinically clear without added sounds Abdomen is soft nontender  No lower extremity edema she has TED hose on S1-S2 no murmur Power is 5/5 There is exaggeration of the normal lumbar lordosis with a little bit of spasm  Data Reviewed: I have personally reviewed following labs and imaging studies  CBC:  Recent Labs Lab 03/19/17 1354 03/20/17 0241  WBC 6.6 5.8  NEUTROABS 3.7  --   HGB 13.7 13.4  HCT 42.3  41.9  MCV 91.0 90.7  PLT 157 962*   Basic Metabolic Panel:  Recent Labs Lab 03/19/17 1354 03/20/17 0241  NA 136 137  K 4.3 4.1  CL 101 102  CO2 29 28  GLUCOSE 99 93  BUN 13 11  CREATININE 0.94 0.97  CALCIUM 8.9 8.9   GFR: Estimated Creatinine Clearance: 52.5 mL/min (by C-G formula based on SCr of 0.97 mg/dL). Liver Function Tests:  Recent Labs Lab 03/19/17 1354  AST 23  ALT 16  ALKPHOS 58  BILITOT 0.7  PROT 6.6  ALBUMIN 3.4*   No results for input(s): LIPASE, AMYLASE in the last 168 hours. No results for input(s): AMMONIA in the last 168 hours. Coagulation Profile: No results for input(s): INR, PROTIME in the last 168 hours. Cardiac  Enzymes:  Recent Labs Lab 03/19/17 1354  TROPONINI <0.03   BNP (last 3 results) No results for input(s): PROBNP in the last 8760 hours. HbA1C: No results for input(s): HGBA1C in the last 72 hours. CBG: No results for input(s): GLUCAP in the last 168 hours. Lipid Profile: No results for input(s): CHOL, HDL, LDLCALC, TRIG, CHOLHDL, LDLDIRECT in the last 72 hours. Thyroid Function Tests: No results for input(s): TSH, T4TOTAL, FREET4, T3FREE, THYROIDAB in the last 72 hours. Anemia Panel: No results for input(s): VITAMINB12, FOLATE, FERRITIN, TIBC, IRON, RETICCTPCT in the last 72 hours. Urine analysis:    Component Value Date/Time   COLORURINE STRAW (A) 03/19/2017 1304   APPEARANCEUR CLEAR 03/19/2017 1304   LABSPEC 1.004 (L) 03/19/2017 1304   PHURINE 7.0 03/19/2017 1304   GLUCOSEU NEGATIVE 03/19/2017 1304   GLUCOSEU NEGATIVE 06/26/2015 0930   HGBUR NEGATIVE 03/19/2017 1304   BILIRUBINUR NEGATIVE 03/19/2017 1304   KETONESUR NEGATIVE 03/19/2017 1304   PROTEINUR NEGATIVE 03/19/2017 1304   UROBILINOGEN 0.2 06/26/2015 0930   NITRITE NEGATIVE 03/19/2017 1304   LEUKOCYTESUR NEGATIVE 03/19/2017 1304     Radiology Studies: Reviewed images personally in health database    Scheduled Meds: . aspirin EC  81 mg Oral BID  . cholecalciferol  1,000 Units Oral q morning - 10a  . clonazePAM  0.5 mg Oral Daily  . fludrocortisone  0.1 mg Oral Daily  . FLUoxetine  40 mg Oral Daily  . heparin  5,000 Units Subcutaneous Q8H  . loratadine  10 mg Oral q morning - 10a  . midodrine  10 mg Oral TID WC  . omega-3 acid ethyl esters  1 g Oral Daily  . pantoprazole  40 mg Oral Daily  . potassium chloride  10 mEq Oral q morning - 10a  . pyridostigmine  60 mg Oral TID  . senna  1 tablet Oral BID  . sodium chloride flush  3 mL Intravenous Q12H   Continuous Infusions: . sodium chloride 75 mL/hr at 03/21/17 1136  . sodium chloride       LOS: 1 day    Time spent: [place], MD [facility]   If 7PM-7AM, please contact night-coverage [email or web address] Password [email or web address] 03/21/2017, 1:33 PM

## 2017-03-21 NOTE — Progress Notes (Signed)
Physical Therapy Treatment Patient Details Name: Sabrina Page MRN: [id number] DOB: 1933/12/13 Today's Date: 03/21/2017    History of Present Illness Pt is an 83 y.o. female admitted for orthostatic hypotension.  PMH consists of ashtma, GERD, depression, hyperlipidemia, CKD, COPD, multi falls, HTN and peripheral vascular disease.    PT Comments    Pt complained of abdominal pain upon entering room and requested use of the bathroom. Pt had already had BM while in bed; sat pt up and used bedpan. Pt had no complaints of dizziness upon sitting. Cleaned pt up. Pt ambulated in hall for 50 ft before complaining of abdominal pain and requested to use bedside commode; no complaints of dizziness. Required cues for hand placement during transfers, proximity to walker and drift correction during ambulation. Daughter translated sporadically. PT limited by BM and abdominal pain.  Follow Up Recommendations  SNF     Equipment Recommendations  None recommended by PT    Recommendations for Other Services       Precautions / Restrictions Precautions Precautions: Fall;Other (comment) Precaution Comments: watch BP    Mobility  Bed Mobility Overal bed mobility: Needs Assistance Bed Mobility: Supine to Sit     Supine to sit: Mod assist     General bed mobility comments: + rail, increased time and effort  Transfers Overall transfer level: Needs assistance Equipment used: Rolling walker (2 wheeled) Transfers: Sit to/from Stand Sit to Stand: Min assist         General transfer comment: VCs for hand placement. Pt had BM during transfer.  Ambulation/Gait Ambulation/Gait assistance: +2 safety/equipment;Mod assist Ambulation Distance (Feet): 50 Feet Assistive device: Rolling walker (2 wheeled) Gait Pattern/deviations: Step-through pattern;Decreased step length - right;Decreased stance time - left;Drifts right/left;Trunk flexed     General Gait Details: Required VCs for proximity to  walker, drift correction. Pt limited by abdominal pain and imminent BM; returned to room in chair.   Stairs            Wheelchair Mobility    Modified Rankin (Stroke Patients Only)       Balance Overall balance assessment: Needs assistance Sitting-balance support: No upper extremity supported;Feet supported Sitting balance-Leahy Scale: Good     Standing balance support: Bilateral upper extremity supported Standing balance-Leahy Scale: Poor Standing balance comment: Heavy reliance on RW and physical assist; Not steady on her feet.                            Cognition Arousal/Alertness: Awake/alert Behavior During Therapy: WFL for tasks assessed/performed Overall Cognitive Status: Within Functional Limits for tasks assessed                                 General Comments: Daughter interpreted but, got distracted easily.      Exercises      General Comments        Pertinent Vitals/Pain Pain Assessment: Faces Faces Pain Scale: Hurts little more Pain Location: back and abdomen Pain Descriptors / Indicators: Grimacing;Guarding;Discomfort Pain Intervention(s): Monitored during session;Repositioned    Home Living                      Prior Function            PT Goals (current goals can now be found in the care plan section) Acute Rehab PT Goals Patient Stated Goal: not stated PT Goal Formulation:  With patient/family Time For Goal Achievement: 03/27/17 Potential to Achieve Goals: Fair Progress towards PT goals: Progressing toward goals    Frequency    Min 3X/week      PT Plan Current plan remains appropriate    Co-evaluation              AM-PAC PT "6 Clicks" Daily Activity  Outcome Measure  Difficulty turning over in bed (including adjusting bedclothes, sheets and blankets)?: Unable Difficulty moving from lying on back to sitting on the side of the bed? : Unable Difficulty sitting down on and standing  up from a chair with arms (e.g., wheelchair, bedside commode, etc,.)?: Unable Help needed moving to and from a bed to chair (including a wheelchair)?: A Lot Help needed walking in hospital room?: A Lot Help needed climbing 3-5 steps with a railing? : Total 6 Click Score: 8    End of Session Equipment Utilized During Treatment: Gait belt Activity Tolerance: Patient limited by fatigue;Other (comment) Patient left: Other (comment);with family/visitor present;with call bell/phone within reach (on bedside commode) Nurse Communication: Mobility status PT Visit Diagnosis: Repeated falls (R29.6);Muscle weakness (generalized) (M62.81);Other abnormalities of gait and mobility (R26.89)     Time: 8889-1694 PT Time Calculation (min) (ACUTE ONLY): 27 min  Charges:  $Gait Training: 8-22 mins $Therapeutic Activity: 8-22 mins                    G Codes:       Janna Arch, SPTA   Janna Arch 03/21/2017, 1:17 PM

## 2017-03-21 NOTE — Progress Notes (Signed)
Pt refused CPAP tonight. Pt states that she doesn't like the way the mask fits. Offered pt another mask but pt declined. Educated pt to have family bring mask from home and explained to pt that we can set up pt mask with our machine so that pt would be more comfortable. Pt vitals stable. RT to monitor as needed.

## 2017-03-21 NOTE — Care Management Note (Signed)
Case Management Note  Patient Details  Name: Sabrina Page MRN: [id number] Date of Birth: 1933/07/23  Subjective/Objective:                    Action/Plan:  Spoke to patient's daughter 's boyfriend at bedside and called patient's daughter Sabrina Page [phone] is at appointment with her father ( patient's husband) who has dementia.   Explained observation and inpatient status to Ms Jilda Panda and patient would need three midnight inpatient stay for Medicare to cover rehab at [facility]. Also explained patient was observation on admission and Dr Verlon Au feels discharge will be today or tomorrow, therefore patient will not have 3 midnight stay. Discussed home health with daughter. Daughter disappointed patient cannot go to SNF but agrees to home health.    Confirmed patient's face sheet information, however, daughter may take patient to her address at discharge which is   [address] , [place] .   Daughter works for [employer] , however she may want [facility] for home health .   Patient currently has oxygen on. Daughter states she does not have oxygen at home. Explained will have bedside nurse check oxygen saturations.   Daughter voiced understanding and will call NCM back shortly with decision on which home health agency and which address.    Asked bedside nurse to check oxygen saturations.  Expected Discharge Date:                  Expected Discharge Plan:  [place]  In-House Referral:     Discharge planning Services  CM Consult  Post Acute Care Choice:  Home Health Choice offered to:  Adult Children  DME Arranged:    DME Agency:     HH Arranged:    HH Agency:     Status of Service:  In process, will continue to follow  If discussed at Long Length of Stay Meetings, dates discussed:    Additional Comments:  Marilu Favre, RN 03/21/2017, 10:11 AM

## 2017-03-21 NOTE — Progress Notes (Addendum)
CSW acknowledging recommendation for SNF placement. However, per MD, patient will not meet qualifying stay criteria at the hospital in order for Medicare to provide coverage for SNF placement at this time. Patient will have to go home with home health instead.  RNCM aware. CSW signing off.  Laveda Abbe, LCSW Clinical Social Worker [phone]   UPDATE 3:00 PM:  CSW updated that patient is having medical issues that warrant continued stay at the hospital; will continue to monitor and facilitate discharge planning if needed.  Laveda Abbe, [place] Clinical Social Worker [phone]

## 2017-03-21 NOTE — Progress Notes (Signed)
Patient not on supplemental ozxygen at home.  Uses CPAP at home and was using [place] O2 HS prophylacticly.  SpO2 RA 100%.

## 2017-03-21 NOTE — Progress Notes (Signed)
Patient large diarrhea BM today x 2.  C/o abdominal cramping, pain unrelieved by pain medication.  Pt states pain is worse after pain med.

## 2017-03-22 ENCOUNTER — Encounter (HOSPITAL_COMMUNITY): Payer: Self-pay | Admitting: *Deleted

## 2017-03-22 LAB — CBC WITH DIFFERENTIAL/PLATELET
Basophils Absolute: 0 10*3/uL (ref 0.0–0.1)
Basophils Relative: 1 %
Eosinophils Absolute: 0.3 10*3/uL (ref 0.0–0.7)
Eosinophils Relative: 5 %
HCT: 42.5 % (ref 36.0–46.0)
Hemoglobin: 13.4 g/dL (ref 12.0–15.0)
Lymphocytes Relative: 39 %
Lymphs Abs: 2.1 10*3/uL (ref 0.7–4.0)
MCH: 28.9 pg (ref 26.0–34.0)
MCHC: 31.5 g/dL (ref 30.0–36.0)
MCV: 91.6 fL (ref 78.0–100.0)
Monocytes Absolute: 0.4 10*3/uL (ref 0.1–1.0)
Monocytes Relative: 8 %
Neutro Abs: 2.6 10*3/uL (ref 1.7–7.7)
Neutrophils Relative %: 47 %
Platelets: 169 10*3/uL (ref 150–400)
RBC: 4.64 MIL/uL (ref 3.87–5.11)
RDW: 13.1 % (ref 11.5–15.5)
WBC: 5.4 10*3/uL (ref 4.0–10.5)

## 2017-03-22 LAB — BASIC METABOLIC PANEL
Anion gap: 7 (ref 5–15)
BUN: 10 mg/dL (ref 6–20)
CO2: 28 mmol/L (ref 22–32)
Calcium: 9 mg/dL (ref 8.9–10.3)
Chloride: 105 mmol/L (ref 101–111)
Creatinine, Ser: 0.88 mg/dL (ref 0.44–1.00)
GFR calc Af Amer: >60 mL/min (ref >60)
GFR calc non Af Amer: 60 mL/min — ABNORMAL LOW (ref >60)
Glucose, Bld: 92 mg/dL (ref 65–99)
Potassium: 4.2 mmol/L (ref 3.5–5.1)
Sodium: 140 mmol/L (ref 135–145)

## 2017-03-22 NOTE — NC FL2 (Signed)
[place] LEVEL OF CARE SCREENING TOOL     IDENTIFICATION  Patient Name: Sabrina Page Birthdate: [birth year] Sex: female Admission Date (Current Location): 03/19/2017  [facility] and [place] Number:  Herbalist and Address:  [facility] [place], [place] [address], [place], [place] [address]      Provider Number: [id number]  Attending Physician Name and Address:  Nita Sells, MD  Relative Name and Phone Number:       Current Level of Care: Hospital Recommended Level of Care: [place] Prior Approval Number:    Date Approved/Denied:   PASRR Number: [id number]  Discharge Plan: SNF    Current Diagnoses: Patient Active Problem List   Diagnosis Date Noted  . Orthostatic hypotension dysautonomic syndrome ([place]) 03/20/2017  . Knee pain, right 12/27/2016  . Fall   . Closed right hip fracture ([place]) 04/15/2016  . Hip fracture ([place]) 04/15/2016  . Syncope due to orthostatic hypotension 01/18/2016  . Orthostatic hypotension 01/18/2016  . Hip pain, chronic 12/25/2015  . Carotid bruit 06/26/2015  . Fall at home 02/01/2015  . Concussion without loss of consciousness 09/13/2014  . Morbid obesity ([place]) 03/06/2014  . Right tennis elbow 01/02/2014  . Wart 01/02/2014  . Chronic fatigue 06/25/2013  . External hemorrhoid, bleeding 02/14/2013  . OSA on CPAP 02/10/2012  . Low back pain radiating to both legs 05/14/2011  . Falls frequently 05/14/2011  . Fatigue 04/22/2011  . Hyperglycemia 04/22/2011  . Neoplasm of uncertain behavior of skin 01/11/2011  . Anxiety state 07/06/2010  . SHOULDER PAIN 05/21/2010  . CONCUSSION WITH LOSS OF CONSCIOUSNESS 05/21/2010  . Actinic keratosis 04/08/2010  . CARPAL TUNNEL SYNDROME 11/28/2009  . NECK PAIN 11/28/2009  . DIZZINESS 11/28/2009  . INSOMNIA, CHRONIC 06/23/2009  . TOBACCO USE, QUIT 04/11/2009  . DIVERTICULOSIS OF COLON 09/06/2008  . DIVERTICULITIS OF COLON 09/06/2008  .  Unspecified chest pain 09/06/2008  . GERD 06/05/2008  . OSTEOARTHRITIS 06/05/2008  . ABNORMAL GLUCOSE NEC 06/05/2008  . BRONCHITIS, ACUTE 04/04/2008  . APHTHOUS STOMATITIS 03/25/2008  . Venous (peripheral) insufficiency 11/03/2007  . Edema 11/03/2007  . SHINGLES 10/23/2007  . DENTAL PAIN 10/23/2007  . PARESTHESIA 10/23/2007  . Pain in limb 07/06/2007  . HYPERLIPIDEMIA 02/17/2007  . Depression 02/17/2007  . Essential hypertension 02/17/2007  . ALLERGIC RHINITIS 02/17/2007  . Asthma, moderate persistent, well-controlled 02/17/2007  . Dyspnea on exertion 02/17/2007  . SYMPTOM, INCONTINENCE, URGE 02/17/2007    Orientation RESPIRATION BLADDER Height & Weight     Self, Time, Situation, Place  Normal Incontinent, External catheter (catheter placed 03/19/17) Weight: 222 lb (100.7 kg) Height:  5\' 4"  (162.6 cm)  BEHAVIORAL SYMPTOMS/MOOD NEUROLOGICAL BOWEL NUTRITION STATUS      Continent    AMBULATORY STATUS COMMUNICATION OF NEEDS Skin   Extensive Assist Verbally Normal                       Personal Care Assistance Level of Assistance  Bathing, Dressing Bathing Assistance: Maximum assistance   Dressing Assistance: Maximum assistance     Functional Limitations Info  Hearing   Hearing Info: Impaired      SPECIAL CARE FACTORS FREQUENCY  PT (By licensed PT), OT (By licensed OT)     PT Frequency: 5x/wk OT Frequency: 5x/wk            Contractures      Additional Factors Info  Code Status, Allergies Code Status Info: Full Allergies Info: Atorvastatin, Enalapril Maleate,  Hctz Hydrochlorothiazide, Lovastatin, Simvastatin, Topamax Topiramate           Current Medications (03/22/2017):  This is the current hospital active medication list Current Facility-Administered Medications  Medication Dose Route Frequency Provider Last Rate Last Dose  . 0.9 %  sodium chloride infusion   Intravenous Continuous Denton Brick, Courage, MD 75 mL/hr at 03/22/17 1303    . 0.9 %   sodium chloride infusion  250 mL Intravenous PRN Emokpae, Courage, MD      . acetaminophen (TYLENOL) tablet 650 mg  650 mg Oral Q6H PRN Denton Brick, Courage, MD   650 mg at 03/21/17 1700   Or  . acetaminophen (TYLENOL) suppository 650 mg  650 mg Rectal Q6H PRN Emokpae, Courage, MD      . albuterol (PROVENTIL) (2.5 MG/3ML) 0.083% nebulizer solution 2.5 mg  2.5 mg Nebulization Q2H PRN Emokpae, Courage, MD      . alum & mag hydroxide-simeth (MAALOX/MYLANTA) 200-200-20 MG/5ML suspension 30 mL  30 mL Oral Q6H PRN Denton Brick, Courage, MD   30 mL at 03/21/17 0616  . aspirin EC tablet 81 mg  81 mg Oral BID Emokpae, Courage, MD   81 mg at 03/22/17 0819  . cholecalciferol (VITAMIN D) tablet 1,000 Units  1,000 Units Oral q morning - 10a Denton Brick, Courage, MD   1,000 Units at 03/22/17 0818  . clonazePAM (KLONOPIN) tablet 0.5 mg  0.5 mg Oral Daily Emokpae, Courage, MD   0.5 mg at 03/22/17 0817  . fludrocortisone (FLORINEF) tablet 0.1 mg  0.1 mg Oral Daily Nita Sells, MD   0.1 mg at 03/21/17 1701  . FLUoxetine (PROZAC) capsule 40 mg  40 mg Oral Daily Emokpae, Courage, MD   40 mg at 03/22/17 0818  . heparin injection 5,000 Units  5,000 Units Subcutaneous Q8H Nita Sells, MD   5,000 Units at 03/22/17 0620  . loratadine (CLARITIN) tablet 10 mg  10 mg Oral q morning - 10a Emokpae, Courage, MD   10 mg at 03/22/17 0818  . midodrine (PROAMATINE) tablet 10 mg  10 mg Oral TID WC Emokpae, Courage, MD   10 mg at 03/22/17 1302  . omega-3 acid ethyl esters (LOVAZA) capsule 1 g  1 g Oral Daily Emokpae, Courage, MD   1 g at 03/22/17 0818  . ondansetron (ZOFRAN) tablet 4 mg  4 mg Oral Q6H PRN Emokpae, Courage, MD       Or  . ondansetron (ZOFRAN) injection 4 mg  4 mg Intravenous Q6H PRN Denton Brick, Courage, MD   4 mg at 03/21/17 1253  . pantoprazole (PROTONIX) EC tablet 40 mg  40 mg Oral Daily Emokpae, Courage, MD   40 mg at 03/22/17 0818  . polyethylene glycol (MIRALAX / GLYCOLAX) packet 17 g  17 g Oral Daily PRN  Emokpae, Courage, MD      . potassium chloride (K-DUR,KLOR-CON) CR tablet 10 mEq  10 mEq Oral q morning - 10a Emokpae, Courage, MD   10 mEq at 03/22/17 0819  . pyridostigmine (MESTINON) tablet 60 mg  60 mg Oral TID Roxan Hockey, MD   60 mg at 03/22/17 0817  . senna (SENOKOT) tablet 8.6 mg  1 tablet Oral BID Emokpae, Courage, MD   8.6 mg at 03/22/17 0818  . sodium chloride flush (NS) 0.9 % injection 3 mL  3 mL Intravenous Q12H Emokpae, Courage, MD   3 mL at 03/21/17 0946  . sodium chloride flush (NS) 0.9 % injection 3 mL  3 mL Intravenous PRN Roxan Hockey, MD      .  traMADol (ULTRAM) tablet 50 mg  50 mg Oral Q6H PRN Emokpae, Courage, MD      . traZODone (DESYREL) tablet 50 mg  50 mg Oral QHS PRN Roxan Hockey, MD         Discharge Medications: Please see discharge summary for a list of discharge medications.  Relevant Imaging Results:  Relevant Lab Results:   Additional Information SS#: [id number]  Geralynn Ochs, LCSW

## 2017-03-22 NOTE — Consult Note (Addendum)
   [facility] CM Inpatient Consult   03/22/2017  Sabrina Page [birth year] [id number]    [facility] Care Management is active.   Please see chart review then encounters for further patient outreach details. Patient is followed by [facility] Community RNCM and [facility] LCSW.  Chart reviewed. Noted discharge plan is likely SNF.  Communicated with both inpatient LCSW and inpatient RNCM to make aware that [name] Management is active and that [name] team has been working with daughter in trying to explore options to get patient more assistance at home.   Confirmed discharge plan is likely SNF with inpatient team.  Will continue to follow and update [name] team.   Marthenia Rolling, [place], RN,BSN [facility] Liaison [phone]

## 2017-03-22 NOTE — Progress Notes (Signed)
PROGRESS NOTE    Sabrina Page  EGB:[id number] DOB: 02/20/1934 DOA: 03/19/2017 PCP: Cassandria Anger, MD   Specialists:   Dr Posey Pronto of Neurology  Brief Narrative:   34 ? Known severe orthostatic hypotension on Midodrine, Mestinon-consideration for droxidopa last OV  Multiple falls about 6 times this year--hip fracture 03/2016 ?  Cognitive decline with delusions-MRI showed global atrophy Grade 2 diastolic heart failure, EF 8/17 =55-60% Obstructive sleep apnea supposed to be on CPAP BMI >35 ?  Asthma/COPD  Admitted with falls secondary to severe orthostasis with blood pressures in the 60s, CT head without acute findings nor CT spine L-spine also negative other labs largely unremarkable   Assessment & Plan:   Principal Problem:   Orthostatic hypotension Active Problems:   Essential hypertension   DIZZINESS   OSA on CPAP   Fall at home   Orthostatic hypotension dysautonomic syndrome (HCC)   Severe orthostasis  Will probably need outpatient therapy and management  Place TED hose  Initiate therapy input and they are recommending skilled care  Continue IV saline 75 cc/h for now  Pt limiting p.o. fluid because she does not want to get up to urinate given fear of falling  I have d/w Dr. Posey Pronto her neurologist on 10/29 started a low dose of Florinef [doxidropa prob not available]  She is marginally improved---I do not think she is able to d/c home and is high risk for fall and injury We will plan on skilled placement tomorrow 10/31 for short term rehab with eventual placement back home   Grade 2 diastolic heart failure EF 8/17 55-60%  Would not be able to tolerate any ACE or beta-blocker secondary to hypotension  See above discussion  Obstructive sleep apnea supposed to be on CPAP  Will inquire and order the same needed  BMI >35  ?Asthma/COPD  No issue currently  Lovenox Presumed full code Inpatient Discussed with daughter at the bedside in  detail   Consultants:   Neurology  Procedures:   None yet  Antimicrobials:   None   Subjective:  daughter who translated  dizzyness improved some ambulation today but felt "woozy" after a certain amount of walking No cp No fever no chills No n/v   Objective: Vitals:   03/21/17 2100 03/22/17 0519 03/22/17 1040 03/22/17 1400  BP:  (!) 144/59 (!) 148/59   Pulse:  65    Resp: 16 16 18 19   Temp: 98.4 F (36.9 C) 98.6 F (37 C) 98.4 F (36.9 C) 98.2 F (36.8 C)  TempSrc: Oral Oral Oral Oral  SpO2: 96% 97%  97%  Weight: 100.7 kg (222 lb)     Height: 5\' 4"  (1.626 m)       Intake/Output Summary (Last 24 hours) at 03/22/17 1458 Last data filed at 03/22/17 1445  Gross per 24 hour  Intake          1088.75 ml  Output              300 ml  Net           788.75 ml   Filed Weights   03/21/17 2100  Weight: 100.7 kg (222 lb)    Examination:  Alert no distress EOMI power good 4 ext, smile symmetric Chest is clinically clear without added sounds Abdomen is soft nontender  No lower extremity edema she has TED hose on S1-S2 no murmur Power is 5/5  Data Reviewed: I have personally reviewed following labs and imaging studies  CBC:  Recent Labs Lab 03/19/17 1354 03/20/17 0241 03/22/17 0531  WBC 6.6 5.8 5.4  NEUTROABS 3.7  --  2.6  HGB 13.7 13.4 13.4  HCT 42.3 41.9 42.5  MCV 91.0 90.7 91.6  PLT 157 148* 616   Basic Metabolic Panel:  Recent Labs Lab 03/19/17 1354 03/20/17 0241 03/22/17 0531  NA 136 137 140  K 4.3 4.1 4.2  CL 101 102 105  CO2 29 28 28   GLUCOSE 99 93 92  BUN 13 11 10   CREATININE 0.94 0.97 0.88  CALCIUM 8.9 8.9 9.0   GFR: Estimated Creatinine Clearance: 57.9 mL/min (by C-G formula based on SCr of 0.88 mg/dL). Liver Function Tests:  Recent Labs Lab 03/19/17 1354  AST 23  ALT 16  ALKPHOS 58  BILITOT 0.7  PROT 6.6  ALBUMIN 3.4*   No results for input(s): LIPASE, AMYLASE in the last 168 hours. No results for input(s):  AMMONIA in the last 168 hours. Coagulation Profile: No results for input(s): INR, PROTIME in the last 168 hours. Cardiac Enzymes:  Recent Labs Lab 03/19/17 1354  TROPONINI <0.03   BNP (last 3 results) No results for input(s): PROBNP in the last 8760 hours. HbA1C: No results for input(s): HGBA1C in the last 72 hours. CBG: No results for input(s): GLUCAP in the last 168 hours. Lipid Profile: No results for input(s): CHOL, HDL, LDLCALC, TRIG, CHOLHDL, LDLDIRECT in the last 72 hours. Thyroid Function Tests: No results for input(s): TSH, T4TOTAL, FREET4, T3FREE, THYROIDAB in the last 72 hours. Anemia Panel: No results for input(s): VITAMINB12, FOLATE, FERRITIN, TIBC, IRON, RETICCTPCT in the last 72 hours. Urine analysis:    Component Value Date/Time   COLORURINE STRAW (A) 03/19/2017 1304   APPEARANCEUR CLEAR 03/19/2017 1304   LABSPEC 1.004 (L) 03/19/2017 1304   PHURINE 7.0 03/19/2017 1304   GLUCOSEU NEGATIVE 03/19/2017 1304   GLUCOSEU NEGATIVE 06/26/2015 0930   HGBUR NEGATIVE 03/19/2017 1304   BILIRUBINUR NEGATIVE 03/19/2017 1304   KETONESUR NEGATIVE 03/19/2017 1304   PROTEINUR NEGATIVE 03/19/2017 1304   UROBILINOGEN 0.2 06/26/2015 0930   NITRITE NEGATIVE 03/19/2017 1304   LEUKOCYTESUR NEGATIVE 03/19/2017 1304     Radiology Studies: Reviewed images personally in health database    Scheduled Meds: . aspirin EC  81 mg Oral BID  . cholecalciferol  1,000 Units Oral q morning - 10a  . clonazePAM  0.5 mg Oral Daily  . fludrocortisone  0.1 mg Oral Daily  . FLUoxetine  40 mg Oral Daily  . heparin  5,000 Units Subcutaneous Q8H  . loratadine  10 mg Oral q morning - 10a  . midodrine  10 mg Oral TID WC  . omega-3 acid ethyl esters  1 g Oral Daily  . pantoprazole  40 mg Oral Daily  . potassium chloride  10 mEq Oral q morning - 10a  . pyridostigmine  60 mg Oral TID  . senna  1 tablet Oral BID  . sodium chloride flush  3 mL Intravenous Q12H   Continuous Infusions: . sodium  chloride 75 mL/hr at 03/22/17 1303  . sodium chloride       LOS: 2 days    Time spent: Maharishi [place], MD [facility]   If 7PM-7AM, please contact night-coverage [email or web address] Password [email or web address] 03/22/2017, 2:58 PM

## 2017-03-22 NOTE — Clinical Social Work Note (Signed)
Clinical Social Work Assessment  Patient Details  Name: Sabrina Page MRN: [id number] Date of Birth: [birth year]  Date of referral:  03/22/17               Reason for consult:  Facility Placement                Permission sought to share information with:  Facility Sport and exercise psychologist, Family Supports Permission granted to share information::  Yes, Verbal Permission Granted  Name::     Geographical information systems officer::  SNF  Relationship::  Daughter  Contact Information:     Housing/Transportation Living arrangements for the past 2 months:  Single Family Home Source of Information:  Adult Children Patient Interpreter Needed:  [place] Criminal Activity/Legal Involvement Pertinent to Current Situation/Hospitalization:  No - Comment as needed Significant Relationships:  Adult Children, Other Family Members, Spouse Lives with:  Self, Spouse, Adult Children Do you feel safe going back to the place where you live?  Yes Need for family participation in patient care:  Yes (Comment)  Care giving concerns:  Patient has been living at home but will benefit from short term rehab at discharge to improve ability to care for self before returning home, or daughter locating a long term facility placement for the patient.   Social Worker assessment / plan:  CSW met with patient's daughter at bedside to discuss recommendation for SNF and preferences for facilities. CSW completed referral and faxed out to the area. CSW confirmed with MD that there was medical necessity for staying in the hospital to transition to SNF tomorrow. CSW to follow up tomorrow to facilitate discharge if medically ready.  Employment status:  Retired Forensic scientist:  Medicare PT Recommendations:  [place] / Referral to community resources:     Patient/Family's Response to care:  Patient agreeable to SNF.  Patient/Family's Understanding of and Emotional Response to Diagnosis, Current Treatment, and  Prognosis:  Patient's daughter was appreciative of information, and hopeful to admit to [facility] or [facility]. Patient's daughter would also like some information on facilities that would be appropriate for assisted living or memory care for the future, when the patient is no longer safe to be at home.   Emotional Assessment Appearance:  Appears stated age Attitude/Demeanor/Rapport:    Affect (typically observed):  Appropriate Orientation:  Oriented to Self, Oriented to Place, Oriented to  Time, Oriented to Situation Alcohol / Substance use:  Not Applicable Psych involvement (Current and /or in the community):  No (Comment)  Discharge Needs  Concerns to be addressed:  Care Coordination Readmission within the last 30 days:  No Current discharge risk:  Physical Impairment Barriers to Discharge:  Continued Medical Work up, [place], [place] 03/22/2017, 2:48 PM

## 2017-03-23 DIAGNOSIS — G903 Multi-system degeneration of the autonomic nervous system: Secondary | ICD-10-CM | POA: Diagnosis not present

## 2017-03-23 DIAGNOSIS — R488 Other symbolic dysfunctions: Secondary | ICD-10-CM | POA: Diagnosis not present

## 2017-03-23 DIAGNOSIS — I959 Hypotension, unspecified: Secondary | ICD-10-CM | POA: Diagnosis not present

## 2017-03-23 DIAGNOSIS — Z9989 Dependence on other enabling machines and devices: Secondary | ICD-10-CM | POA: Diagnosis not present

## 2017-03-23 DIAGNOSIS — R55 Syncope and collapse: Secondary | ICD-10-CM | POA: Diagnosis not present

## 2017-03-23 DIAGNOSIS — F419 Anxiety disorder, unspecified: Secondary | ICD-10-CM | POA: Diagnosis not present

## 2017-03-23 DIAGNOSIS — G4733 Obstructive sleep apnea (adult) (pediatric): Secondary | ICD-10-CM | POA: Diagnosis not present

## 2017-03-23 DIAGNOSIS — Z8679 Personal history of other diseases of the circulatory system: Secondary | ICD-10-CM | POA: Diagnosis not present

## 2017-03-23 DIAGNOSIS — R300 Dysuria: Secondary | ICD-10-CM | POA: Diagnosis not present

## 2017-03-23 DIAGNOSIS — R030 Elevated blood-pressure reading, without diagnosis of hypertension: Secondary | ICD-10-CM | POA: Diagnosis not present

## 2017-03-23 DIAGNOSIS — I10 Essential (primary) hypertension: Secondary | ICD-10-CM

## 2017-03-23 DIAGNOSIS — M545 Low back pain: Secondary | ICD-10-CM | POA: Diagnosis not present

## 2017-03-23 DIAGNOSIS — R350 Frequency of micturition: Secondary | ICD-10-CM | POA: Diagnosis not present

## 2017-03-23 DIAGNOSIS — R41841 Cognitive communication deficit: Secondary | ICD-10-CM | POA: Diagnosis not present

## 2017-03-23 DIAGNOSIS — E669 Obesity, unspecified: Secondary | ICD-10-CM | POA: Diagnosis not present

## 2017-03-23 DIAGNOSIS — I951 Orthostatic hypotension: Secondary | ICD-10-CM | POA: Diagnosis not present

## 2017-03-23 DIAGNOSIS — G8929 Other chronic pain: Secondary | ICD-10-CM | POA: Diagnosis not present

## 2017-03-23 DIAGNOSIS — R42 Dizziness and giddiness: Secondary | ICD-10-CM

## 2017-03-23 DIAGNOSIS — M6281 Muscle weakness (generalized): Secondary | ICD-10-CM | POA: Diagnosis not present

## 2017-03-23 MED ORDER — CLONAZEPAM 0.5 MG PO TABS: 0.5000 mg | ORAL_TABLET | Freq: Every day | ORAL | 0 refills | Status: DC

## 2017-03-23 MED ORDER — TRAMADOL HCL 50 MG PO TABS: 50.0000 mg | ORAL_TABLET | Freq: Two times a day (BID) | ORAL | 0 refills | Status: DC | PRN

## 2017-03-23 MED ORDER — POLYETHYLENE GLYCOL 3350 17 G PO PACK: 17.0000 g | PACK | Freq: Every day | ORAL | 0 refills | Status: DC | PRN

## 2017-03-23 MED ORDER — CLONAZEPAM 0.5 MG PO TABS: ORAL_TABLET | ORAL | 0 refills | Status: DC

## 2017-03-23 MED ORDER — FLUDROCORTISONE ACETATE 0.1 MG PO TABS: 0.1000 mg | ORAL_TABLET | Freq: Every day | ORAL | 0 refills | Status: DC

## 2017-03-23 NOTE — Care Management Important Message (Signed)
Important Message  Patient Details  Name: Sabrina Page MRN: [id number] Date of Birth: 04-13-1934   Medicare Important Message Given:  Yes    [name] Circle 03/23/2017, 12:33 PM

## 2017-03-23 NOTE — Clinical Social Work Placement (Signed)
Nurse to call report to [phone], Room 807P in [place]  NOTE  Date:  03/23/2017  Patient Details  Name: Sabrina Page MRN: [id number] Date of Birth: 12-07-1933  Clinical Social Work is seeking post-discharge placement for this patient at the [place] level of care (*CSW will initial, date and re-position this form in  chart as items are completed):  Yes   Patient/family provided with [place] Work Department's list of facilities offering this level of care within the geographic area requested by the patient (or if unable, by the patient's family).  Yes   Patient/family informed of their freedom to choose among providers that offer the needed level of care, that participate in Medicare, Medicaid or managed care program needed by the patient, have an available bed and are willing to accept the patient.  Yes   Patient/family informed of [place]'s ownership interest in [facility] and [facility], as well as of the fact that they are under no obligation to receive care at these facilities.  PASRR submitted to EDS on 03/22/17     PASRR number received on       Existing PASRR number confirmed on       FL2 transmitted to all facilities in geographic area requested by pt/family on       FL2 transmitted to all facilities within larger geographic area on 03/22/17     Patient informed that his/her managed care company has contracts with or will negotiate with certain facilities, including the following:        Yes   Patient/family informed of bed offers received.  Patient chooses bed at [facility]     Physician recommends and patient chooses bed at      Patient to be transferred to [facility] on 03/23/17.  Patient to be transferred to facility by [facility]     Patient family notified on 03/23/17 of transfer.  Name of family member notified:  [name]     PHYSICIAN       Additional  Comment:    _______________________________________________ Geralynn Ochs, LCSW 03/23/2017, 2:53 PM

## 2017-03-23 NOTE — Progress Notes (Signed)
Report called to [facility] at [facility].

## 2017-03-23 NOTE — Progress Notes (Signed)
Physical Therapy Treatment Patient Details Name: Sabrina Page MRN: [id number] DOB: [birth year] Today's Date: 03/23/2017    History of Present Illness Pt is an 83 y.o. female admitted for orthostatic hypotension.  PMH consists of ashtma, GERD, depression, hyperlipidemia, CKD, COPD, multi falls, HTN and peripheral vascular disease.    PT Comments    Patient tolerated ambulating 77ft X2 with min/mod A +2 for safety and RW. Daughter present and translated. Pt continues to demonstrate decreased strength and activity tolerance and c/o abdominal pain. Continue to progress as tolerated with anticipated d/c to SNF for further skilled PT services.    Follow Up Recommendations  SNF     Equipment Recommendations  None recommended by PT    Recommendations for Other Services       Precautions / Restrictions Precautions Precautions: Fall;Other (comment) Precaution Comments: watch BP Restrictions Weight Bearing Restrictions: No    Mobility  Bed Mobility               General bed mobility comments: pt OOB in chair upon arrival  Transfers Overall transfer level: Needs assistance Equipment used: Rolling walker (2 wheeled) Transfers: Sit to/from Stand Sit to Stand: Min assist         General transfer comment: pt stood X2 from recliner; assist to power up into standing and cues for safe hand placement   Ambulation/Gait Ambulation/Gait assistance: +2 safety/equipment;Min assist;Mod assist Ambulation Distance (Feet):  (76ft X2 with seated rest break) Assistive device: Rolling walker (2 wheeled) Gait Pattern/deviations: Step-through pattern;Decreased step length - right;Decreased stance time - left;Drifts right/left;Trunk flexed Gait velocity: decreased   General Gait Details: cues for posture and proximity of RW; assist for balance and management of RW; pt required seated rest break due to fatigue and increased assist with return to room   Stairs             Wheelchair Mobility    Modified Rankin (Stroke Patients Only)       Balance Overall balance assessment: Needs assistance Sitting-balance support: No upper extremity supported;Feet supported Sitting balance-Leahy Scale: Good     Standing balance support: Bilateral upper extremity supported Standing balance-Leahy Scale: Poor                              Cognition Arousal/Alertness: Awake/alert Behavior During Therapy: WFL for tasks assessed/performed Overall Cognitive Status: Within Functional Limits for tasks assessed                                 General Comments: Daughter interpreted       Exercises      General Comments        Pertinent Vitals/Pain Pain Assessment: Faces Faces Pain Scale: Hurts little more Pain Location: abdomen Pain Descriptors / Indicators: Grimacing;Guarding;Discomfort Pain Intervention(s): Monitored during session;Repositioned    Home Living                      Prior Function            PT Goals (current goals can now be found in the care plan section) Acute Rehab PT Goals PT Goal Formulation: With patient/family Time For Goal Achievement: 03/27/17 Potential to Achieve Goals: Fair Progress towards PT goals: Progressing toward goals    Frequency    Min 3X/week      PT Plan Current plan remains appropriate  Co-evaluation              AM-PAC PT "6 Clicks" Daily Activity  Outcome Measure  Difficulty turning over in bed (including adjusting bedclothes, sheets and blankets)?: A Lot Difficulty moving from lying on back to sitting on the side of the bed? : Unable Difficulty sitting down on and standing up from a chair with arms (e.g., wheelchair, bedside commode, etc,.)?: Unable Help needed moving to and from a bed to chair (including a wheelchair)?: A Little Help needed walking in hospital room?: A Lot Help needed climbing 3-5 steps with a railing? : A Lot 6 Click Score: 11     End of Session Equipment Utilized During Treatment: Gait belt Activity Tolerance: Patient tolerated treatment well Patient left: with family/visitor present;with call bell/phone within reach;in chair Nurse Communication: Mobility status PT Visit Diagnosis: Repeated falls (R29.6);Muscle weakness (generalized) (M62.81);Other abnormalities of gait and mobility (R26.89)     Time: 3267-1245 PT Time Calculation (min) (ACUTE ONLY): 21 min  Charges:  $Gait Training: 8-22 mins                    G Codes:       Earney Navy, PTA Pager: [phone]     Darliss Cheney 03/23/2017, 3:59 PM

## 2017-03-23 NOTE — Consult Note (Signed)
   [facility] CM Inpatient Consult   03/23/2017  Glendell Rojero 1933-06-07 [id number]     [facility] Care Management follow up.   Went to bedside to speak with patient. However, she was up on bedside commode and family not present.   Spoke with inpatient RNCM who indicates patient likely to dc to SNF today.   Will update [facility] team.    Marthenia Rolling, [place], RN,BSN [facility] Liaison [phone]

## 2017-03-23 NOTE — Discharge Summary (Signed)
Physician Discharge Summary  Sabrina Page YTK:[id number] DOB: 1933/11/30 DOA: 03/19/2017  PCP: Cassandria Anger, MD  Admit date: 03/19/2017 Discharge date: 03/23/2017  Time spent: 35 minutes  Recommendations for Outpatient Follow-up:  1. PCP Dr.plotnikov in 1 week, please FU Random Cortisol 2. Neurology Dr.Patel in 4month   Discharge Diagnoses:  Principal Problem:   Orthostatic hypotension Active Problems:   Essential hypertension   DIZZINESS   OSA on CPAP   Fall at home   Orthostatic hypotension dysautonomic syndrome [facility])   Discharge Condition: stable  Diet recommendation: regular  Filed Weights   03/21/17 2100  Weight: 100.7 kg (222 lb)    History of present illness:  44 ? Known severe orthostatic hypotension on Midodrine, Mestinon, admitted with falls secondary to severe orthostasis with blood pressures in the 60s, CT head without acute findings nor CT spine L-spine also negative other labs largely unremarkable  Hospital Course:   Severe orthostatic hypotension -she has severe autonomic dysregulation with orthostatic hypotension at baseline, worsened on admission -continued on chronic midodrine, recently her neurologist Dr. Posey Pronto had increased her Mestinon dose to 60 mg 3 times a day -she was hydrated with saline -Case discussed with Dr. Posey Pronto her neurologist by my partner Dr.Samtani on 10/29 and subsequently she was started on low-dose Florinef. -She has improved to some extent still remains orthostatic however less symptomatic than before -Plan for discharge to skilled nursing facility recommended to stay well-hydrated, liberalize salt intake, wear TED hose/compression stockings -Advised daughter to keep a close eye on her fluid status, also to monitor for increase edema or fluid overload while on Florinef -I sent off Random cortisol level, please check this at FU -Follow-up closely with PCP and Dr. Posey Pronto  Grade 2 diastolic heart failure EF 8/17  55-60% -Unable to tolerate diuretics or ACE inhibitor/beta blocker secondary to hypotension -See above discussion  Obstructive sleep apnea supposed to be on CPAP             Will inquire and order the same needed  BMI >35  ?Asthma/COPD             No issue currently  Discharge Exam: Vitals:   03/22/17 2100 03/23/17 0630  BP: (!) 157/60   Pulse: 61   Resp: 20   Temp: 98.4 F (36.9 C) 98.7 F (37.1 C)  SpO2: 97% 96%    General: AAOx3 Cardiovascular: S1S2/RRR Respiratory: CTAB  Discharge Instructions   Discharge Instructions    Diet - low sodium heart healthy    Complete by:  As directed    Increase activity slowly    Complete by:  As directed      Current Discharge Medication List    START taking these medications   Details  fludrocortisone (FLORINEF) 0.1 MG tablet Take 1 tablet (0.1 mg total) by mouth daily. Qty: 4 tablet, Refills: 0    polyethylene glycol (MIRALAX / GLYCOLAX) packet Take 17 g by mouth daily as needed for mild constipation. Refills: 0      CONTINUE these medications which have CHANGED   Details  clonazePAM (KLONOPIN) 0.5 MG tablet TAKE 1 TABLET BY MOUTH TWICE A DAY AS NEEDED FOR ANXIETY Qty: 10 tablet, Refills: 0    traMADol (ULTRAM) 50 MG tablet Take 1 tablet (50 mg total) by mouth every 12 (twelve) hours as needed. Qty: 10 tablet, Refills: 0      CONTINUE these medications which have NOT CHANGED   Details  aspirin 81 MG EC tablet Take 1 tablet (  81 mg total) by mouth 2 (two) times daily. Qty: 60 tablet, Refills: 0    B Complex-C (SUPER B COMPLEX PO) Take 1 tablet by mouth every morning.     Cholecalciferol 1000 UNITS capsule Take 1,000 Units by mouth every morning.     KLOR-CON 8 MEQ tablet TAKE 1 TABLET EVERY MORNING Qty: 30 tablet, Refills: 0    loratadine (CLARITIN) 10 MG tablet Take 1 tablet (10 mg total) by mouth every morning. Qty: 100 tablet, Refills: 3    midodrine (PROAMATINE) 10 MG tablet TAKE 1 TABLET (10 MG  TOTAL) BY MOUTH 3 (THREE) TIMES DAILY. Qty: 90 tablet, Refills: 5    Omega-3 Fatty Acids (FISH OIL) 1000 MG CAPS Take 1 capsule by mouth every morning.     omeprazole (PRILOSEC) 20 MG capsule Take 2 capsules (40 mg total) by mouth daily. Qty: 180 capsule, Refills: 3    pyridostigmine (MESTINON) 60 MG tablet Take 1 tablet (60 mg total) by mouth 3 (three) times daily. Qty: 90 tablet, Refills: 11    FLUoxetine (PROZAC) 40 MG capsule Take 1 capsule (40 mg total) by mouth daily. Qty: 90 capsule, Refills: 3      STOP taking these medications     doxycycline (VIBRA-TABS) 100 MG tablet        Allergies  Allergen Reactions  . Atorvastatin     REACTION: upset stomach  . Enalapril Maleate   . Hctz [Hydrochlorothiazide]     Dizzy   . Lovastatin     REACTION: tongue stress  . Simvastatin     REACTION: mouth sores  . Topamax [Topiramate]     syncope      The results of significant diagnostics from this hospitalization (including imaging, microbiology, ancillary and laboratory) are listed below for reference.    Significant Diagnostic Studies: Dg Lumbar Spine Complete  Result Date: 03/19/2017 CLINICAL DATA:  Weakness with back pain. Multiple falls over the past 2 days. Initial encounter. 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 Negative for acute fracture or traumatic malalignment. Lower lumbar facet arthropathy most notable at L4-5 where there is grade 1 anterolisthesis. Generalized spondylosis and degenerative disc narrowing, greatest at L4-5. Osteopenia. Atherosclerosis. IMPRESSION: 1. No acute finding. 2. Lumbar spine degeneration with L4-5 anterolisthesis. Electronically Signed   By: Monte Fantasia M.D.   On: 03/19/2017 13:40   Ct Head Wo Contrast  Result Date: 03/19/2017 CLINICAL DATA:  Fall last night. High clinical risk. Initial encounter. EXAM: CT HEAD  WITHOUT CONTRAST CT CERVICAL SPINE WITHOUT CONTRAST TECHNIQUE: Multidetector CT imaging of the head and cervical spine was performed following the standard protocol without intravenous contrast. Multiplanar CT image reconstructions of the cervical spine were also generated. COMPARISON:  Brain MRI 03/02/2016. Head and cervical spine CT 05/10/2010 FINDINGS: CT HEAD FINDINGS Brain: No evidence of acute infarction, hemorrhage, hydrocephalus, extra-axial collection or mass lesion/mass effect. Atrophy with ventriculomegaly. Mild for age chronic small vessel ischemia. Vascular: Atherosclerotic calcification.  No hyperdense vessel. Skull: Negative for fracture. Sinuses/Orbits: No evidence of injury CT CERVICAL SPINE FINDINGS Alignment: No traumatic malalignment. Degenerative reversal of cervical lordosis that is progressed from 2011. Mild C7-T1 anterolisthesis. Skull base and vertebrae: Negative for fracture. Soft tissues and spinal canal: No prevertebral fluid or swelling. No visible canal hematoma. Bilateral thyroid nodules measuring up to 1 cm, incidental based on size and demographics. Disc levels:  Diffuse disc and facet  degeneration. Upper chest: No acute finding. IMPRESSION: No evidence of acute intracranial or cervical spine injury. Electronically Signed   By: Monte Fantasia M.D.   On: 03/19/2017 14:06   Ct Cervical Spine Wo Contrast  Result Date: 03/19/2017 CLINICAL DATA:  Fall last night. High clinical risk. Initial encounter. EXAM: CT HEAD WITHOUT CONTRAST CT CERVICAL SPINE WITHOUT CONTRAST TECHNIQUE: Multidetector CT imaging of the head and cervical spine was performed following the standard protocol without intravenous contrast. Multiplanar CT image reconstructions of the cervical spine were also generated. COMPARISON:  Brain MRI 03/02/2016. Head and cervical spine CT 05/10/2010 FINDINGS: CT HEAD FINDINGS Brain: No evidence of acute infarction, hemorrhage, hydrocephalus, extra-axial collection or mass  lesion/mass effect. Atrophy with ventriculomegaly. Mild for age chronic small vessel ischemia. Vascular: Atherosclerotic calcification.  No hyperdense vessel. Skull: Negative for fracture. Sinuses/Orbits: No evidence of injury CT CERVICAL SPINE FINDINGS Alignment: No traumatic malalignment. Degenerative reversal of cervical lordosis that is progressed from 2011. Mild C7-T1 anterolisthesis. Skull base and vertebrae: Negative for fracture. Soft tissues and spinal canal: No prevertebral fluid or swelling. No visible canal hematoma. Bilateral thyroid nodules measuring up to 1 cm, incidental based on size and demographics. Disc levels:  Diffuse disc and facet degeneration. Upper chest: No acute finding. IMPRESSION: No evidence of acute intracranial or cervical spine injury. Electronically Signed   By: Monte Fantasia M.D.   On: 03/19/2017 14:06    Microbiology: No results found for this or any previous visit (from the past 240 hour(s)).   Labs: Basic Metabolic Panel:  Recent Labs Lab 03/19/17 1354 03/20/17 0241 03/22/17 0531  NA 136 137 140  K 4.3 4.1 4.2  CL 101 102 105  CO2 29 28 28   GLUCOSE 99 93 92  BUN 13 11 10   CREATININE 0.94 0.97 0.88  CALCIUM 8.9 8.9 9.0   Liver Function Tests:  Recent Labs Lab 03/19/17 1354  AST 23  ALT 16  ALKPHOS 58  BILITOT 0.7  PROT 6.6  ALBUMIN 3.4*   No results for input(s): LIPASE, AMYLASE in the last 168 hours. No results for input(s): AMMONIA in the last 168 hours. CBC:  Recent Labs Lab 03/19/17 1354 03/20/17 0241 03/22/17 0531  WBC 6.6 5.8 5.4  NEUTROABS 3.7  --  2.6  HGB 13.7 13.4 13.4  HCT 42.3 41.9 42.5  MCV 91.0 90.7 91.6  PLT 157 148* 169   Cardiac Enzymes:  Recent Labs Lab 03/19/17 1354  TROPONINI <0.03   BNP: BNP (last 3 results) No results for input(s): BNP in the last 8760 hours.  ProBNP (last 3 results) No results for input(s): PROBNP in the last 8760 hours.  CBG: No results for input(s): GLUCAP in the last  168 hours.     SignedDomenic Polite MD.  [facility] 03/23/2017, 2:11 PM

## 2017-03-24 ENCOUNTER — Telehealth: Payer: Self-pay | Admitting: *Deleted

## 2017-03-24 ENCOUNTER — Other Ambulatory Visit: Payer: Self-pay | Admitting: Licensed Clinical Social Worker

## 2017-03-24 NOTE — Patient Outreach (Signed)
[facility]) Care Management  [facility] Social Work  03/24/2017  Sabrina Page 01-28-1934 [id number]  Encounter Medications:  Outpatient Encounter Prescriptions as of 03/24/2017  Medication Sig Note  . aspirin 81 MG EC tablet Take 1 tablet (81 mg total) by mouth 2 (two) times daily.   . B Complex-C (SUPER B COMPLEX PO) Take 1 tablet by mouth every morning.    . Cholecalciferol 1000 UNITS capsule Take 1,000 Units by mouth every morning.    . clonazePAM (KLONOPIN) 0.5 MG tablet TAKE 1 TABLET BY MOUTH TWICE A DAY AS NEEDED FOR ANXIETY   . fludrocortisone (FLORINEF) 0.1 MG tablet Take 1 tablet (0.1 mg total) by mouth daily.   Marland Kitchen FLUoxetine (PROZAC) 40 MG capsule Take 1 capsule (40 mg total) by mouth daily. (Patient not taking: Reported on 03/20/2017) 27/07/5007: duplicate  . KLOR-CON 8 MEQ tablet TAKE 1 TABLET EVERY MORNING   . loratadine (CLARITIN) 10 MG tablet Take 1 tablet (10 mg total) by mouth every morning.   . midodrine (PROAMATINE) 10 MG tablet TAKE 1 TABLET (10 MG TOTAL) BY MOUTH 3 (THREE) TIMES DAILY.   Marland Kitchen Omega-3 Fatty Acids (FISH OIL) 1000 MG CAPS Take 1 capsule by mouth every morning.    Marland Kitchen omeprazole (PRILOSEC) 20 MG capsule Take 2 capsules (40 mg total) by mouth daily.   . polyethylene glycol (MIRALAX / GLYCOLAX) packet Take 17 g by mouth daily as needed for mild constipation.   Marland Kitchen pyridostigmine (MESTINON) 60 MG tablet Take 1 tablet (60 mg total) by mouth 3 (three) times daily.   . traMADol (ULTRAM) 50 MG tablet Take 1 tablet (50 mg total) by mouth every 12 (twelve) hours as needed.    No facility-administered encounter medications on file as of 03/24/2017.     Functional Status:  In your present state of health, do you have any difficulty performing the following activities: 03/24/2017 03/21/2017  Hearing? Sabrina Page  Vision? N N  Difficulty concentrating or making decisions? N N  Walking or climbing stairs? Y Y  Dressing or bathing? Y Y  Doing errands,  shopping? N N  Preparing Food and eating ? - -  Using the Toilet? - -  In the past six months, have you accidently leaked urine? - -  Do you have problems with loss of bowel control? - -  Managing your Medications? - -  Managing your Finances? - -  Housekeeping or managing your Housekeeping? - -  Some recent data might be hidden    Fall/Depression Screening:  PHQ 2/9 Scores 03/24/2017 01/17/2017 12/20/2016 02/10/2016  PHQ - 2 Score 0 0 - 2  Exception Documentation - - Other- indicate reason in comment box -  Not completed - - call completed with dtr -    Assessment: CSW completed SNF visit at [facility] with patient and spouse on 03/24/17. CSW usually contacts patient's daughter Sabrina Page to discuss patient's care as patient's primary language is [place]. However, both spouse and patient speak broken english and were able to communicate with CSW effectively during appointment. Patient and spouse report that they have been in the [place] for 24 years and have been in [place], [place] since 2004. Patient reports that she is feeling "a lot better" today. Patient was laughing and smiling through entire visit and is very pleasant. She reports that she started PT today. Patient reports that she had a fall on 03/18/17 and another fall on 03/19/17 before coming to the hospital. Patient reports having difficulty with  her blood pressure stating that it went from 140/70 to 53/17 after she stood up. Patient reports that her blood pressure tends to drop drastically once she stands up but that it has been better today. CSW completed call to Sabrina Page and had her on speaker phone for family to hear. Daughter reports that she is very overwhelmed but is thankful that patient is at SNF for the next 21 days. She reports that she still has not completed the application for ALF placement at [facility]. Daughter wishes to get both patient and spouse on the wait list for facility as soon as possible but has not followed up  with the necessary steps in order to do so. Emotional support was provided to daughter over phone call. Daughter was encouraged to complete ALF application this weekend since she will not be working. Daughter agreeable to work on application. Daughter and family were very appreciative of SNF visit and phone call and understand that [place] will become involved again once patient discharges from SNF.  CSW went to SNF social worker's office and met with Sabrina Page. She reports that they will have a 72 hour care team planning meeting on 03/31/17 at 3:00 pm and will have more updates then.   [facility] CM Care Plan Problem One     Most Recent Value  Care Plan Problem One  Lack of personal care resources  Role Documenting the Problem One  Clinical Social Worker  Care Plan for Problem One  Active  [name] Long Term Goal   Patient will have a safe and stable dischaerge back home from SNF within 90 days   [place] Term Goal Start Date  03/24/17  Interventions for Problem One Long Term Goal  Patient went to [facility] on 03/23/17. CSW will coordinate care with SNF social worker, patient, spouse and daughter to ensure that patient has a safe and stable return back home once she discharges from SNF with all services, resources and medical equipment that are needed.  [facility] CM Short Term Goal #1   Family will be successfully placed on wait list for PACE program within 30 days  [facility] CM Short Term Goal #1 Start Date  02/15/17  Interventions for Short Term Goal #1  Patient and spouse are no longer agreeable to PACE placement now. Goal not met.     Plan: CSW will follow up within one week. CSW will continue to provide social work support to family.  Sabrina Page, BSW, MSW, [email or web address] Phone: [phone] Fax: [phone]

## 2017-03-24 NOTE — Telephone Encounter (Signed)
Transition Care Management Follow-up Telephone Call   Date discharged? 03/23/17   How have you been since you were released from the hospital? Spoke w/daughter [name]) since pt english is not good. Daughter states mom is ok still weak, and tired   Do you understand why you were in the hospital? YES   Do you understand the discharge instructions? YES   Where were you discharged to? Home   Items Reviewed:  Medications reviewed: YES  Allergies reviewed: YES  Dietary changes reviewed: No change  Referrals reviewed: No referral needed   Functional Questionnaire:   Activities of Daily Living (ADLs):   She states mom are independent in the following: bathing and hygiene, feeding, continence, grooming, toileting and dressing States she require assistance with the following: ambulation sometimes need to help her whn she feels dizzy   Any transportation issues/concerns?: NO   Any patient concerns? NO   Confirmed importance and date/time of follow-up visits scheduled YES, appt 04/05/17  Provider Appointment booked with Dr. Alain Marion  Confirmed with patient if condition begins to worsen call PCP or go to the ER.  Patient was given the office number and encouraged to call back with question or concerns.  : YES

## 2017-03-25 DIAGNOSIS — Z9989 Dependence on other enabling machines and devices: Secondary | ICD-10-CM | POA: Diagnosis not present

## 2017-03-25 DIAGNOSIS — E669 Obesity, unspecified: Secondary | ICD-10-CM | POA: Diagnosis not present

## 2017-03-25 DIAGNOSIS — G4733 Obstructive sleep apnea (adult) (pediatric): Secondary | ICD-10-CM | POA: Diagnosis not present

## 2017-03-25 DIAGNOSIS — I951 Orthostatic hypotension: Secondary | ICD-10-CM | POA: Diagnosis not present

## 2017-03-30 DIAGNOSIS — R030 Elevated blood-pressure reading, without diagnosis of hypertension: Secondary | ICD-10-CM | POA: Diagnosis not present

## 2017-03-30 DIAGNOSIS — Z8679 Personal history of other diseases of the circulatory system: Secondary | ICD-10-CM | POA: Diagnosis not present

## 2017-04-01 ENCOUNTER — Other Ambulatory Visit: Payer: Self-pay | Admitting: Licensed Clinical Social Worker

## 2017-04-01 DIAGNOSIS — R300 Dysuria: Secondary | ICD-10-CM | POA: Diagnosis not present

## 2017-04-01 DIAGNOSIS — I951 Orthostatic hypotension: Secondary | ICD-10-CM | POA: Diagnosis not present

## 2017-04-01 DIAGNOSIS — F419 Anxiety disorder, unspecified: Secondary | ICD-10-CM | POA: Diagnosis not present

## 2017-04-01 DIAGNOSIS — R350 Frequency of micturition: Secondary | ICD-10-CM | POA: Diagnosis not present

## 2017-04-01 NOTE — Patient Outreach (Signed)
Sabrina Page [facility]) Care Management  04/01/2017  Sabrina Page 1933-08-21 [id number]  Assessment- CSW completed call to [facility] SNF in order to gain updates on patient's care team planning meeting that took place yesterday. CSW was unable to attend this meeting due to conflicting schedule. CSW unable to reach SNF social worker but left a message requesting a call or email back with updates.  Plan-CSW will await for return call or complete additional outreach attempt.  Sabrina Page, BSW, MSW, [email or web address] Phone: [phone] Fax: [phone]

## 2017-04-04 ENCOUNTER — Other Ambulatory Visit: Payer: Self-pay | Admitting: Licensed Clinical Social Worker

## 2017-04-04 NOTE — Patient Outreach (Signed)
[facility]) Care Management  04/04/2017  Sabrina Page 02/07/1934 [id number]  Assessment- CSW to completed outreach call to patient's daughter Sabrina Page and she answered. HIPPA verifications were provided. She reports that patient does not like [place] copy and is ready to come home. Patient is ONLY agreeable to LTC placement at [facility] but daughter is now concerned that patient is longer skillable anymore. She reports that [facility] only has Medicaid beds for skillable patients. However, daughter has completed entire application for [facility] and just needs to drop off application which she plans to do so this week. CSW was notified that patient is working on walking steps at [facility]. Family is hopeful that a first floor apartment at their residence will open up soon.  Plan-CSW will follow up with SNF within one week.  Sabrina Page, BSW, MSW, [email or web address] Phone: [phone] Fax: [phone]

## 2017-04-05 ENCOUNTER — Ambulatory Visit: Payer: Medicare Other | Admitting: Internal Medicine

## 2017-04-06 ENCOUNTER — Encounter: Payer: Self-pay | Admitting: *Deleted

## 2017-04-06 DIAGNOSIS — M545 Low back pain: Secondary | ICD-10-CM | POA: Diagnosis not present

## 2017-04-06 DIAGNOSIS — I951 Orthostatic hypotension: Secondary | ICD-10-CM | POA: Diagnosis not present

## 2017-04-06 DIAGNOSIS — G8929 Other chronic pain: Secondary | ICD-10-CM | POA: Diagnosis not present

## 2017-04-06 DIAGNOSIS — R030 Elevated blood-pressure reading, without diagnosis of hypertension: Secondary | ICD-10-CM | POA: Diagnosis not present

## 2017-04-11 ENCOUNTER — Other Ambulatory Visit: Payer: Self-pay | Admitting: Licensed Clinical Social Worker

## 2017-04-11 DIAGNOSIS — R350 Frequency of micturition: Secondary | ICD-10-CM | POA: Diagnosis not present

## 2017-04-11 DIAGNOSIS — F419 Anxiety disorder, unspecified: Secondary | ICD-10-CM | POA: Diagnosis not present

## 2017-04-11 NOTE — Patient Outreach (Signed)
[facility]) Care Management  04/11/2017  Sabrina Page May 31, 1933 [id number]  Assessment- CSW received return call from [place] worker. CSW was notified that patient will be discharging back home on 04/13/17 with [facility]. Family has chosen [employer] [facility] and orders placed will be PT, OT, Nurse Aide and Social Work. No medical equipment was needed or ordered.  CSW will notify [facility] RNCM.  Plan-CSW will provide update to [place] and follow up after SNF discharge.  Eula Fried, BSW, MSW, [email or web address] Phone: [phone] Fax: [phone]

## 2017-04-11 NOTE — Patient Outreach (Signed)
[facility]) Care Management  04/11/2017  Dorianne Perret May 29, 1933 [id number]  Assessment- CSW completed outreach to [facility] and asked to speak to SNF social worker [place]. CSW unable to reach SNF social worker but left a detailed message asking for a return call with discharge updates.  Plan-CSW will await for return call from SNF or follow up within one week.  Eula Fried, BSW, MSW, [email or web address] Phone: [phone] Fax: [phone]

## 2017-04-12 ENCOUNTER — Other Ambulatory Visit: Payer: Self-pay | Admitting: Licensed Clinical Social Worker

## 2017-04-12 NOTE — Patient Outreach (Signed)
[place] [facility] [facility]) Care Management  04/12/2017  Sabrina Page 04-22-1934 [id number]  Assessment- CSW received incoming call from [place] worker. She reports that family has decided to push back patient's discharge date to 04/15/17 due to daughter's schedule.  Plan-CSW will provide update to [facility] RNCM.   Eula Fried, BSW, MSW, [email or web address] Phone: [phone] Fax: [phone]

## 2017-04-12 NOTE — Patient Outreach (Signed)
Encounter created in error

## 2017-04-13 ENCOUNTER — Telehealth: Payer: Self-pay | Admitting: Internal Medicine

## 2017-04-13 NOTE — Telephone Encounter (Signed)
Needs verbals for evaluation for PT, OT and Nurse and an Aid  Please call back

## 2017-04-13 NOTE — Telephone Encounter (Signed)
Verbals given FYI 

## 2017-04-18 ENCOUNTER — Other Ambulatory Visit: Payer: Self-pay | Admitting: Licensed Clinical Social Worker

## 2017-04-18 DIAGNOSIS — M1991 Primary osteoarthritis, unspecified site: Secondary | ICD-10-CM | POA: Diagnosis not present

## 2017-04-18 DIAGNOSIS — I5030 Unspecified diastolic (congestive) heart failure: Secondary | ICD-10-CM | POA: Diagnosis not present

## 2017-04-18 DIAGNOSIS — G4733 Obstructive sleep apnea (adult) (pediatric): Secondary | ICD-10-CM | POA: Diagnosis not present

## 2017-04-18 DIAGNOSIS — F329 Major depressive disorder, single episode, unspecified: Secondary | ICD-10-CM | POA: Diagnosis not present

## 2017-04-18 DIAGNOSIS — G908 Other disorders of autonomic nervous system: Secondary | ICD-10-CM | POA: Diagnosis not present

## 2017-04-18 DIAGNOSIS — I739 Peripheral vascular disease, unspecified: Secondary | ICD-10-CM | POA: Diagnosis not present

## 2017-04-18 DIAGNOSIS — Z9989 Dependence on other enabling machines and devices: Secondary | ICD-10-CM | POA: Diagnosis not present

## 2017-04-18 DIAGNOSIS — J449 Chronic obstructive pulmonary disease, unspecified: Secondary | ICD-10-CM | POA: Diagnosis not present

## 2017-04-18 DIAGNOSIS — E669 Obesity, unspecified: Secondary | ICD-10-CM | POA: Diagnosis not present

## 2017-04-18 DIAGNOSIS — N189 Chronic kidney disease, unspecified: Secondary | ICD-10-CM | POA: Diagnosis not present

## 2017-04-18 NOTE — Patient Outreach (Signed)
[facility]) Care Management  04/18/2017  Sabrina Page 05-26-1933 [id number]  Assessment- CSW completed call to patient's daughter Sabrina Page to follow up on SNF discharge. Daughter answered and provided HIPPA verifications. She reports that her mother is doing "pretty good." She reports that she has found someone that she is paying out of pocket to do some light cleaning and cooking for her parents and that this is working for their family very well. Daughter reports that [facility] aide did not come out Saturday or Sunday like scheduled but will be coming today. She reports that [facility] will be starting tomorrow as well. CSW questioned if daughter was able to drop off completed placement application at [facility] and she confirmed that was completed this. However, patient is no longer skillable for placement and that is the only source for Medicaid funding in terms of LTC placemement. Patient does not wish to go to any other facilities and prefers to be on the wait list for [facility] as family feels she may be skillable with time. Daughter reports that she installed a camera system in patient's residence so that they can keep an eye on family as well.  Plan-CSW will follow up within two weeks and continue to assist with social work needs.  Eula Fried, BSW, MSW, [email or web address] Phone: [phone] Fax: [phone]

## 2017-04-19 ENCOUNTER — Other Ambulatory Visit: Payer: Self-pay | Admitting: *Deleted

## 2017-04-19 DIAGNOSIS — M1991 Primary osteoarthritis, unspecified site: Secondary | ICD-10-CM | POA: Diagnosis not present

## 2017-04-19 DIAGNOSIS — G908 Other disorders of autonomic nervous system: Secondary | ICD-10-CM | POA: Diagnosis not present

## 2017-04-19 DIAGNOSIS — J449 Chronic obstructive pulmonary disease, unspecified: Secondary | ICD-10-CM | POA: Diagnosis not present

## 2017-04-19 DIAGNOSIS — F329 Major depressive disorder, single episode, unspecified: Secondary | ICD-10-CM | POA: Diagnosis not present

## 2017-04-19 DIAGNOSIS — N189 Chronic kidney disease, unspecified: Secondary | ICD-10-CM | POA: Diagnosis not present

## 2017-04-19 DIAGNOSIS — I5030 Unspecified diastolic (congestive) heart failure: Secondary | ICD-10-CM | POA: Diagnosis not present

## 2017-04-19 NOTE — Patient Outreach (Signed)
[facility]) Care Management  04/19/2017  Sabrina Page 04-05-1934 [id number]   Member recently discharged from SNF.  Call placed to member's daughter for transition of care, however she is unable to talk at this time.  State she will call this care manager tomorrow.  Will await call back.  [name], [place], MSN [place] [phone]

## 2017-04-22 ENCOUNTER — Other Ambulatory Visit: Payer: Self-pay | Admitting: *Deleted

## 2017-04-22 NOTE — Patient Outreach (Signed)
[place] [facility]) Care Management  04/22/2017  Sabrina Page 1933/09/20 [id number]   Call placed to daughter in effort to initiate transition of care, however she state she does not have much time to talk as she is on her way to a her patient's house.  She does report that the member is doing "alright" stating she has hired more assistance in the home.  State they are on a waiting list to move to a first floor apartment and she has installed cameras in the home to keep an eye on them.  She agrees to have this care manager perform home visit next week.  Will complete transition of care assessment and care plan at that time.  Will request interpreter as member's primary language is [place].  Daughter advised to contact with questions/concerns.  [name], MSN, RN [facility] Care Management  [facility] Manager [phone]

## 2017-04-23 DIAGNOSIS — N189 Chronic kidney disease, unspecified: Secondary | ICD-10-CM | POA: Diagnosis not present

## 2017-04-23 DIAGNOSIS — I5030 Unspecified diastolic (congestive) heart failure: Secondary | ICD-10-CM | POA: Diagnosis not present

## 2017-04-23 DIAGNOSIS — M1991 Primary osteoarthritis, unspecified site: Secondary | ICD-10-CM | POA: Diagnosis not present

## 2017-04-23 DIAGNOSIS — J449 Chronic obstructive pulmonary disease, unspecified: Secondary | ICD-10-CM | POA: Diagnosis not present

## 2017-04-23 DIAGNOSIS — F329 Major depressive disorder, single episode, unspecified: Secondary | ICD-10-CM | POA: Diagnosis not present

## 2017-04-23 DIAGNOSIS — G908 Other disorders of autonomic nervous system: Secondary | ICD-10-CM | POA: Diagnosis not present

## 2017-04-25 DIAGNOSIS — J449 Chronic obstructive pulmonary disease, unspecified: Secondary | ICD-10-CM | POA: Diagnosis not present

## 2017-04-25 DIAGNOSIS — I5030 Unspecified diastolic (congestive) heart failure: Secondary | ICD-10-CM | POA: Diagnosis not present

## 2017-04-25 DIAGNOSIS — F329 Major depressive disorder, single episode, unspecified: Secondary | ICD-10-CM | POA: Diagnosis not present

## 2017-04-25 DIAGNOSIS — G908 Other disorders of autonomic nervous system: Secondary | ICD-10-CM | POA: Diagnosis not present

## 2017-04-25 DIAGNOSIS — N189 Chronic kidney disease, unspecified: Secondary | ICD-10-CM | POA: Diagnosis not present

## 2017-04-25 DIAGNOSIS — M1991 Primary osteoarthritis, unspecified site: Secondary | ICD-10-CM | POA: Diagnosis not present

## 2017-04-27 DIAGNOSIS — I5030 Unspecified diastolic (congestive) heart failure: Secondary | ICD-10-CM | POA: Diagnosis not present

## 2017-04-27 DIAGNOSIS — J449 Chronic obstructive pulmonary disease, unspecified: Secondary | ICD-10-CM | POA: Diagnosis not present

## 2017-04-27 DIAGNOSIS — F329 Major depressive disorder, single episode, unspecified: Secondary | ICD-10-CM | POA: Diagnosis not present

## 2017-04-27 DIAGNOSIS — N189 Chronic kidney disease, unspecified: Secondary | ICD-10-CM | POA: Diagnosis not present

## 2017-04-27 DIAGNOSIS — G908 Other disorders of autonomic nervous system: Secondary | ICD-10-CM | POA: Diagnosis not present

## 2017-04-27 DIAGNOSIS — M1991 Primary osteoarthritis, unspecified site: Secondary | ICD-10-CM | POA: Diagnosis not present

## 2017-04-28 ENCOUNTER — Other Ambulatory Visit: Payer: Self-pay | Admitting: *Deleted

## 2017-04-28 DIAGNOSIS — N189 Chronic kidney disease, unspecified: Secondary | ICD-10-CM | POA: Diagnosis not present

## 2017-04-28 DIAGNOSIS — J449 Chronic obstructive pulmonary disease, unspecified: Secondary | ICD-10-CM | POA: Diagnosis not present

## 2017-04-28 DIAGNOSIS — M1991 Primary osteoarthritis, unspecified site: Secondary | ICD-10-CM | POA: Diagnosis not present

## 2017-04-28 DIAGNOSIS — F329 Major depressive disorder, single episode, unspecified: Secondary | ICD-10-CM | POA: Diagnosis not present

## 2017-04-28 DIAGNOSIS — I5030 Unspecified diastolic (congestive) heart failure: Secondary | ICD-10-CM | POA: Diagnosis not present

## 2017-04-28 DIAGNOSIS — G908 Other disorders of autonomic nervous system: Secondary | ICD-10-CM | POA: Diagnosis not present

## 2017-04-28 NOTE — Patient Outreach (Signed)
[place] [facility]) Care Management   04/28/2017  Swati Granberry [birth year] [id number]  Regan Mcbryar is an 83 y.o. female  Subjective:   Member alert and oriented x3, denies pain or discomfort at this time.  Denies dizziness.  Denies fall since discharge from hospital.  Objective:   Review of Systems  Constitutional: Negative.   HENT: Negative.   Eyes: Negative.   Respiratory: Negative.   Cardiovascular: Negative.   Gastrointestinal: Negative.   Genitourinary: Negative.   Musculoskeletal: Positive for joint pain.       Intermittent knee pain   Skin: Negative.   Neurological: Negative.   Endo/Heme/Allergies: Negative.   Psychiatric/Behavioral: Negative.     Physical Exam  Constitutional: She is oriented to person, place, and time. She appears well-developed and well-nourished.  Neck: Normal range of motion.  Cardiovascular: Normal rate, regular rhythm and normal heart sounds.  Respiratory: Effort normal and breath sounds normal.  GI: Soft. Bowel sounds are normal.  Musculoskeletal: Normal range of motion.  Neurological: She is alert and oriented to person, place, and time.  Skin: Skin is warm and dry.   BP 140/84   Pulse 70   Resp 18   SpO2 97%   Encounter Medications:   Outpatient Encounter Medications as of 04/28/2017  Medication Sig Note  . aspirin 81 MG EC tablet Take 1 tablet (81 mg total) by mouth 2 (two) times daily.   . B Complex-C (SUPER B COMPLEX PO) Take 1 tablet by mouth every morning.    . Cholecalciferol 1000 UNITS capsule Take 1,000 Units by mouth every morning.    . clonazePAM (KLONOPIN) 0.5 MG tablet TAKE 1 TABLET BY MOUTH TWICE A DAY AS NEEDED FOR ANXIETY   . fludrocortisone (FLORINEF) 0.1 MG tablet Take 1 tablet (0.1 mg total) by mouth daily.   Marland Kitchen FLUoxetine (PROZAC) 40 MG capsule Take 1 capsule (40 mg total) by mouth daily. (Patient not taking: Reported on 03/20/2017) 54/65/0354: duplicate  . KLOR-CON 8 MEQ tablet TAKE 1  TABLET EVERY MORNING   . loratadine (CLARITIN) 10 MG tablet Take 1 tablet (10 mg total) by mouth every morning.   . midodrine (PROAMATINE) 10 MG tablet TAKE 1 TABLET (10 MG TOTAL) BY MOUTH 3 (THREE) TIMES DAILY.   Marland Kitchen Omega-3 Fatty Acids (FISH OIL) 1000 MG CAPS Take 1 capsule by mouth every morning.    Marland Kitchen omeprazole (PRILOSEC) 20 MG capsule Take 2 capsules (40 mg total) by mouth daily.   . polyethylene glycol (MIRALAX / GLYCOLAX) packet Take 17 g by mouth daily as needed for mild constipation.   Marland Kitchen pyridostigmine (MESTINON) 60 MG tablet Take 1 tablet (60 mg total) by mouth 3 (three) times daily.   . traMADol (ULTRAM) 50 MG tablet Take 1 tablet (50 mg total) by mouth every 12 (twelve) hours as needed.    No facility-administered encounter medications on file as of 04/28/2017.     Functional Status:   In your present state of health, do you have any difficulty performing the following activities: 03/24/2017 03/21/2017  Hearing? Tempie Donning  Vision? N N  Difficulty concentrating or making decisions? N N  Walking or climbing stairs? Y Y  Dressing or bathing? Y Y  Doing errands, shopping? N N  Preparing Food and eating ? - -  Using the Toilet? - -  In the past six months, have you accidently leaked urine? - -  Do you have problems with loss of bowel control? - -  Managing your Medications? - -  Managing your Finances? - -  Housekeeping or managing your Housekeeping? - -  Some recent data might be hidden    Fall/Depression Screening:    Fall Risk  03/17/2017 01/17/2017 12/20/2016  Falls in the past year? Yes Yes Yes  Number falls in past yr: 2 or more 2 or more 2 or more  Injury with Fall? - Yes Yes  Risk Factor Category  - High Fall Risk High Fall Risk  Risk for fall due to : - History of fall(s);Impaired balance/gait;Impaired mobility;Impaired vision;Medication side effect Impaired balance/gait;History of fall(s);Impaired mobility;Medication side effect;Mental status change;Impaired vision  Follow  up Falls evaluation completed Follow up appointment Falls evaluation completed;Education provided;Falls prevention discussed   PHQ 2/9 Scores 03/24/2017 01/17/2017 12/20/2016 02/10/2016  PHQ - 2 Score 0 0 - 2  Exception Documentation - - Other- indicate reason in comment box -  Not completed - - call completed with dtr -    Assessment:    Met with member at scheduled time, personal care aid present during visit.  Assessment complete, member in no apparent distress at this time.  Provided member with this care manager's contact information again with new [facility] calendar, advised personal care aide of contact information in case of emergency.    Plan:   Will follow up with daughter next week.  THN CM Care Plan Problem One     Most Recent Value  Care Plan Problem One  Risk for readmission related to orthostatic hypotension as evidenced by recent hospitalization requiring stay at rehab facility  Role Documenting the Problem One  Care Management [place] for Problem One  Active  [place] Term Goal   Member will not be readmitted to hospital within 31 days of discharge from SNF  [place] Term Goal Start Date  04/28/17  Interventions for Problem One Long Term Goal  Discussed with member the importance of following discharge instructions, including follow up appointments, medications, diet, and home health involvement, to decrease the risk of readmission  THN CM Short Term Goal #1   Member will have blood pressure taken and recorded daily over the next 4 weeks  THN CM Short Term Goal #1 Start Date  04/28/17  Interventions for Short Term Goal #1  Confirmed that member has blood pressure monitor in the home.  Advised personal care aide of importance of recording BP readings for trending purposes.  Provided with new THN calendar with blood pressure log.  THN CM Short Term Goal #2   Daughter will report compliance with medications over the next 4 weeks  THN CM Short Term Goal #2 Start Date   04/28/17  Interventions for Short Term Goal #2  Medications reviewed with daughter as she prepares pill box weekly for member.     [name], [place], MSN [place] [phone]

## 2017-05-02 ENCOUNTER — Other Ambulatory Visit: Payer: Self-pay | Admitting: *Deleted
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
## 2017-05-03 ENCOUNTER — Other Ambulatory Visit: Payer: Self-pay | Admitting: Licensed Clinical Social Worker
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
## 2017-05-05 DIAGNOSIS — J449 Chronic obstructive pulmonary disease, unspecified: Secondary | ICD-10-CM | POA: Diagnosis not present

## 2017-05-05 DIAGNOSIS — G908 Other disorders of autonomic nervous system: Secondary | ICD-10-CM | POA: Diagnosis not present

## 2017-05-05 DIAGNOSIS — M1991 Primary osteoarthritis, unspecified site: Secondary | ICD-10-CM | POA: Diagnosis not present

## 2017-05-05 DIAGNOSIS — N189 Chronic kidney disease, unspecified: Secondary | ICD-10-CM | POA: Diagnosis not present

## 2017-05-05 DIAGNOSIS — I5030 Unspecified diastolic (congestive) heart failure: Secondary | ICD-10-CM | POA: Diagnosis not present

## 2017-05-05 DIAGNOSIS — F329 Major depressive disorder, single episode, unspecified: Secondary | ICD-10-CM | POA: Diagnosis not present

## 2017-05-06 DIAGNOSIS — I5030 Unspecified diastolic (congestive) heart failure: Secondary | ICD-10-CM | POA: Diagnosis not present

## 2017-05-06 DIAGNOSIS — M1991 Primary osteoarthritis, unspecified site: Secondary | ICD-10-CM | POA: Diagnosis not present

## 2017-05-06 DIAGNOSIS — J449 Chronic obstructive pulmonary disease, unspecified: Secondary | ICD-10-CM | POA: Diagnosis not present

## 2017-05-06 DIAGNOSIS — G908 Other disorders of autonomic nervous system: Secondary | ICD-10-CM | POA: Diagnosis not present

## 2017-05-06 DIAGNOSIS — N189 Chronic kidney disease, unspecified: Secondary | ICD-10-CM | POA: Diagnosis not present

## 2017-05-06 DIAGNOSIS — F329 Major depressive disorder, single episode, unspecified: Secondary | ICD-10-CM | POA: Diagnosis not present

## 2017-05-08 DIAGNOSIS — I5030 Unspecified diastolic (congestive) heart failure: Secondary | ICD-10-CM | POA: Diagnosis not present

## 2017-05-08 DIAGNOSIS — M1991 Primary osteoarthritis, unspecified site: Secondary | ICD-10-CM | POA: Diagnosis not present

## 2017-05-08 DIAGNOSIS — F329 Major depressive disorder, single episode, unspecified: Secondary | ICD-10-CM | POA: Diagnosis not present

## 2017-05-08 DIAGNOSIS — G908 Other disorders of autonomic nervous system: Secondary | ICD-10-CM | POA: Diagnosis not present

## 2017-05-08 DIAGNOSIS — N189 Chronic kidney disease, unspecified: Secondary | ICD-10-CM | POA: Diagnosis not present

## 2017-05-08 DIAGNOSIS — J449 Chronic obstructive pulmonary disease, unspecified: Secondary | ICD-10-CM | POA: Diagnosis not present

## 2017-05-09 ENCOUNTER — Other Ambulatory Visit: Payer: Self-pay | Admitting: *Deleted

## 2017-05-09 NOTE — Patient Outreach (Signed)
[place] [facility]) Care Management  05/09/2017  Lillianna Sotomayor 20-Nov-1933 [id number]   Weekly transition of care call placed to member's daughter.  Report that the member has been doing well, however state that the member complained of feeling weak today.  She did go to her home to assess her, report blood pressure was stable at 150/80.  Daughter report member appeared well, complained of legs feeling weak today.  She has appointment with primary MD at the end of January.  Advised to call to schedule a sooner appointment if member continue to complain about weakness.  No other concerns at this time, will follow up next week.  [facility] CM Care Plan Problem One     Most Recent Value  Care Plan Problem One  Risk for readmission related to orthostatic hypotension as evidenced by recent hospitalization requiring stay at rehab facility  Role Documenting the Problem One  Care Management [place] for Problem One  Active  [place] Term Goal   Member will not be readmitted to hospital within 31 days of discharge from SNF  [place] Term Goal Start Date  04/28/17  Interventions for Problem One Long Term Goal  Confirmed with daughter that she has adequate assistance in the home to help with health management.  Importance of support discussed with daughter as it decreaseds risk of readmission  THN CM Short Term Goal #1   Member will have blood pressure taken and recorded daily over the next 4 weeks  THN CM Short Term Goal #1 Start Date  04/28/17  [facility] CM Short Term Goal #1 Met Date  05/09/17  Interventions for Short Term Goal #1  Daughter advised of blood pressure log left in the member's home, advised of the importance of blood pressure checks in effort to manage medication administration effectively  THN CM Short Term Goal #2   Daughter will report compliance with medications over the next 4 weeks  THN CM Short Term Goal #2 Start Date  04/28/17  [facility] CM Short Term Goal #2 Met  Date  05/02/17  Interventions for Short Term Goal #2  Medications reviewed with daughter as she prepares pill box weekly for member.     [name], [place], MSN [place] [phone]

## 2017-05-10 DIAGNOSIS — F329 Major depressive disorder, single episode, unspecified: Secondary | ICD-10-CM | POA: Diagnosis not present

## 2017-05-10 DIAGNOSIS — J449 Chronic obstructive pulmonary disease, unspecified: Secondary | ICD-10-CM | POA: Diagnosis not present

## 2017-05-10 DIAGNOSIS — M1991 Primary osteoarthritis, unspecified site: Secondary | ICD-10-CM | POA: Diagnosis not present

## 2017-05-10 DIAGNOSIS — N189 Chronic kidney disease, unspecified: Secondary | ICD-10-CM | POA: Diagnosis not present

## 2017-05-10 DIAGNOSIS — I5030 Unspecified diastolic (congestive) heart failure: Secondary | ICD-10-CM | POA: Diagnosis not present

## 2017-05-10 DIAGNOSIS — G908 Other disorders of autonomic nervous system: Secondary | ICD-10-CM | POA: Diagnosis not present

## 2017-05-12 DIAGNOSIS — J449 Chronic obstructive pulmonary disease, unspecified: Secondary | ICD-10-CM | POA: Diagnosis not present

## 2017-05-12 DIAGNOSIS — N189 Chronic kidney disease, unspecified: Secondary | ICD-10-CM | POA: Diagnosis not present

## 2017-05-12 DIAGNOSIS — I5030 Unspecified diastolic (congestive) heart failure: Secondary | ICD-10-CM | POA: Diagnosis not present

## 2017-05-12 DIAGNOSIS — F329 Major depressive disorder, single episode, unspecified: Secondary | ICD-10-CM | POA: Diagnosis not present

## 2017-05-12 DIAGNOSIS — M1991 Primary osteoarthritis, unspecified site: Secondary | ICD-10-CM | POA: Diagnosis not present

## 2017-05-12 DIAGNOSIS — G908 Other disorders of autonomic nervous system: Secondary | ICD-10-CM | POA: Diagnosis not present

## 2017-05-18 ENCOUNTER — Telehealth: Payer: Self-pay | Admitting: Internal Medicine

## 2017-05-18 DIAGNOSIS — F329 Major depressive disorder, single episode, unspecified: Secondary | ICD-10-CM | POA: Diagnosis not present

## 2017-05-18 DIAGNOSIS — N189 Chronic kidney disease, unspecified: Secondary | ICD-10-CM | POA: Diagnosis not present

## 2017-05-18 DIAGNOSIS — J449 Chronic obstructive pulmonary disease, unspecified: Secondary | ICD-10-CM | POA: Diagnosis not present

## 2017-05-18 DIAGNOSIS — G908 Other disorders of autonomic nervous system: Secondary | ICD-10-CM | POA: Diagnosis not present

## 2017-05-18 DIAGNOSIS — I5030 Unspecified diastolic (congestive) heart failure: Secondary | ICD-10-CM | POA: Diagnosis not present

## 2017-05-18 DIAGNOSIS — M1991 Primary osteoarthritis, unspecified site: Secondary | ICD-10-CM | POA: Diagnosis not present

## 2017-05-18 NOTE — Telephone Encounter (Unsigned)
Copied from [place]. Topic: General - Other >> May 18, 2017  2:50 PM Neva Seat wrote:  Elias Else an OT with [facility] [phone]  called needing verbal ok for pt to continue with Ot.  A voicemail can be left.

## 2017-05-19 NOTE — Telephone Encounter (Signed)
LM giving verbals, FYI 

## 2017-05-19 NOTE — Telephone Encounter (Signed)
Ok Thx 

## 2017-05-20 ENCOUNTER — Telehealth: Payer: Self-pay | Admitting: Internal Medicine

## 2017-05-20 ENCOUNTER — Other Ambulatory Visit: Payer: Self-pay | Admitting: *Deleted

## 2017-05-20 DIAGNOSIS — I5030 Unspecified diastolic (congestive) heart failure: Secondary | ICD-10-CM | POA: Diagnosis not present

## 2017-05-20 DIAGNOSIS — F329 Major depressive disorder, single episode, unspecified: Secondary | ICD-10-CM | POA: Diagnosis not present

## 2017-05-20 DIAGNOSIS — G908 Other disorders of autonomic nervous system: Secondary | ICD-10-CM | POA: Diagnosis not present

## 2017-05-20 DIAGNOSIS — N189 Chronic kidney disease, unspecified: Secondary | ICD-10-CM | POA: Diagnosis not present

## 2017-05-20 DIAGNOSIS — J449 Chronic obstructive pulmonary disease, unspecified: Secondary | ICD-10-CM | POA: Diagnosis not present

## 2017-05-20 DIAGNOSIS — M1991 Primary osteoarthritis, unspecified site: Secondary | ICD-10-CM | POA: Diagnosis not present

## 2017-05-20 NOTE — Patient Outreach (Signed)
[place] [facility]) Care Management  05/20/2017  Sabrina Page [birth year] [id number]   Weekly transition of care call placed to daughter/caregiver.  She report member is "doing good."  State she has not had any hypotensive episodes this week, taking medications as prescribed.  She is unable to discuss further as she is currently with a patient.  Transition of care program complete, continue to have personal care services throughout the day.  Will follow up with daughter within the next 2 weeks, if no further concerns, will close case.   THN CM Care Plan Problem One     Most Recent Value  Care Plan Problem One  Risk for readmission related to orthostatic hypotension as evidenced by recent hospitalization requiring stay at rehab facility  Role Documenting the Problem One  Care Management [place] for Problem One  Not Active  [facility] Long Term Goal   Member will not be readmitted to hospital within 31 days of discharge from SNF  [place] Term Goal Start Date  04/28/17  [facility] Long Term Goal Met Date  05/20/17  Interventions for Problem One Long Term Goal  Confirmed with daughter that she has adequate assistance in the home to help with health management.  Importance of support discussed with daughter as it decreaseds risk of readmission  THN CM Short Term Goal #1   Member will have blood pressure taken and recorded daily over the next 4 weeks  THN CM Short Term Goal #1 Start Date  04/28/17  [facility] CM Short Term Goal #1 Met Date  05/09/17  Interventions for Short Term Goal #1  Daughter advised of blood pressure log left in the member's home, advised of the importance of blood pressure checks in effort to manage medication administration effectively  THN CM Short Term Goal #2   Daughter will report compliance with medications over the next 4 weeks  THN CM Short Term Goal #2 Start Date  04/28/17  [facility] CM Short Term Goal #2 Met Date  05/02/17  Interventions for Short Term  Goal #2  Medications reviewed with daughter as she prepares pill box weekly for member.     Sabrina Page, [place], MSN [name] [phone]

## 2017-05-20 NOTE — Telephone Encounter (Signed)
Copied from [place]. Topic: Inquiry >> May 20, 2017 12:04 PM Malena Catholic I, NT wrote: Reason for CRM: [name] call from Home care she nee Verbal Orders for Physical therapy for 3 month 2 times for weeks more inf call [place] [phone]

## 2017-05-22 NOTE — Telephone Encounter (Signed)
Ok Thx 

## 2017-05-23 NOTE — Telephone Encounter (Signed)
Notified [name] w/MD response...[name]

## 2017-05-25 DIAGNOSIS — F329 Major depressive disorder, single episode, unspecified: Secondary | ICD-10-CM | POA: Diagnosis not present

## 2017-05-25 DIAGNOSIS — I5030 Unspecified diastolic (congestive) heart failure: Secondary | ICD-10-CM | POA: Diagnosis not present

## 2017-05-25 DIAGNOSIS — J449 Chronic obstructive pulmonary disease, unspecified: Secondary | ICD-10-CM | POA: Diagnosis not present

## 2017-05-25 DIAGNOSIS — M1991 Primary osteoarthritis, unspecified site: Secondary | ICD-10-CM | POA: Diagnosis not present

## 2017-05-25 DIAGNOSIS — N189 Chronic kidney disease, unspecified: Secondary | ICD-10-CM | POA: Diagnosis not present

## 2017-05-25 DIAGNOSIS — G908 Other disorders of autonomic nervous system: Secondary | ICD-10-CM | POA: Diagnosis not present

## 2017-05-26 DIAGNOSIS — N3281 Overactive bladder: Secondary | ICD-10-CM | POA: Diagnosis not present

## 2017-05-31 DIAGNOSIS — G908 Other disorders of autonomic nervous system: Secondary | ICD-10-CM | POA: Diagnosis not present

## 2017-05-31 DIAGNOSIS — N189 Chronic kidney disease, unspecified: Secondary | ICD-10-CM | POA: Diagnosis not present

## 2017-05-31 DIAGNOSIS — J449 Chronic obstructive pulmonary disease, unspecified: Secondary | ICD-10-CM | POA: Diagnosis not present

## 2017-05-31 DIAGNOSIS — M1991 Primary osteoarthritis, unspecified site: Secondary | ICD-10-CM | POA: Diagnosis not present

## 2017-05-31 DIAGNOSIS — I5030 Unspecified diastolic (congestive) heart failure: Secondary | ICD-10-CM | POA: Diagnosis not present

## 2017-05-31 DIAGNOSIS — F329 Major depressive disorder, single episode, unspecified: Secondary | ICD-10-CM | POA: Diagnosis not present

## 2017-06-02 ENCOUNTER — Telehealth: Payer: Self-pay | Admitting: Neurology

## 2017-06-02 ENCOUNTER — Other Ambulatory Visit: Payer: Self-pay | Admitting: *Deleted

## 2017-06-02 DIAGNOSIS — N189 Chronic kidney disease, unspecified: Secondary | ICD-10-CM | POA: Diagnosis not present

## 2017-06-02 DIAGNOSIS — F329 Major depressive disorder, single episode, unspecified: Secondary | ICD-10-CM | POA: Diagnosis not present

## 2017-06-02 DIAGNOSIS — G908 Other disorders of autonomic nervous system: Secondary | ICD-10-CM | POA: Diagnosis not present

## 2017-06-02 DIAGNOSIS — M1991 Primary osteoarthritis, unspecified site: Secondary | ICD-10-CM | POA: Diagnosis not present

## 2017-06-02 DIAGNOSIS — I5030 Unspecified diastolic (congestive) heart failure: Secondary | ICD-10-CM | POA: Diagnosis not present

## 2017-06-02 DIAGNOSIS — J449 Chronic obstructive pulmonary disease, unspecified: Secondary | ICD-10-CM | POA: Diagnosis not present

## 2017-06-02 MED ORDER — FLUDROCORTISONE ACETATE 0.1 MG PO TABS: 0.1000 mg | ORAL_TABLET | Freq: Every day | ORAL | 5 refills | Status: DC

## 2017-06-02 NOTE — Telephone Encounter (Signed)
Rx sent in for Florinef.  #30 with 5 refills.

## 2017-06-02 NOTE — Telephone Encounter (Signed)
Patient's daughter called needing to see if she can have a refill on a Blood pressure medication that was given to her while in the hospital? She is unsure the name of the medication. Thanks

## 2017-06-03 ENCOUNTER — Other Ambulatory Visit: Payer: Self-pay | Admitting: *Deleted

## 2017-06-03 DIAGNOSIS — I5030 Unspecified diastolic (congestive) heart failure: Secondary | ICD-10-CM | POA: Diagnosis not present

## 2017-06-03 DIAGNOSIS — J449 Chronic obstructive pulmonary disease, unspecified: Secondary | ICD-10-CM | POA: Diagnosis not present

## 2017-06-03 DIAGNOSIS — G908 Other disorders of autonomic nervous system: Secondary | ICD-10-CM | POA: Diagnosis not present

## 2017-06-03 DIAGNOSIS — M1991 Primary osteoarthritis, unspecified site: Secondary | ICD-10-CM | POA: Diagnosis not present

## 2017-06-03 DIAGNOSIS — F329 Major depressive disorder, single episode, unspecified: Secondary | ICD-10-CM | POA: Diagnosis not present

## 2017-06-03 DIAGNOSIS — N189 Chronic kidney disease, unspecified: Secondary | ICD-10-CM | POA: Diagnosis not present

## 2017-06-03 NOTE — Patient Outreach (Signed)
[facility]) Care Management  06/03/2017  Sabrina Page [birth year] [id number]   Call placed to member's caregiver/daughter to follow up on member's current health status.  Original plan was to close member's case today, however, daughter report some concerns.  She state that the member is in need of adjustments for her CPAP machine.  Report she has called [facility] to request appointment but has not received a call back yet.  Call placed to [place] to report concerns, they will daughter directly to assess need/request.    She report frustration regarding the aides coming in the home as the last one that she was comfortable with had to stop coming due to scheduling conflict.  They are getting acquainted to the new one.  She state that the member's husband has not been well due to a reaction from medication, fear that the member is neglecting her own personal care to care for her husband.  Daughter state they are still on the waiting list for [place] for assisted living.  Discussed the possibility of having to look into a different facility if room does not become available soon.  She state member's husband is improving, hope to be back to his baseline soon.  She continue to provide as much support as she can and has a plan in place to provide more support during the night.  This care manager will follow up within the next 2 weeks, if no further concerns will close case.  [name], [place], MSN [place] [phone]

## 2017-06-04 ENCOUNTER — Other Ambulatory Visit: Payer: Self-pay | Admitting: Neurology

## 2017-06-06 DIAGNOSIS — F329 Major depressive disorder, single episode, unspecified: Secondary | ICD-10-CM | POA: Diagnosis not present

## 2017-06-06 DIAGNOSIS — I5030 Unspecified diastolic (congestive) heart failure: Secondary | ICD-10-CM | POA: Diagnosis not present

## 2017-06-06 DIAGNOSIS — G908 Other disorders of autonomic nervous system: Secondary | ICD-10-CM | POA: Diagnosis not present

## 2017-06-06 DIAGNOSIS — J449 Chronic obstructive pulmonary disease, unspecified: Secondary | ICD-10-CM | POA: Diagnosis not present

## 2017-06-06 DIAGNOSIS — N189 Chronic kidney disease, unspecified: Secondary | ICD-10-CM | POA: Diagnosis not present

## 2017-06-06 DIAGNOSIS — M1991 Primary osteoarthritis, unspecified site: Secondary | ICD-10-CM | POA: Diagnosis not present

## 2017-06-10 ENCOUNTER — Other Ambulatory Visit (INDEPENDENT_AMBULATORY_CARE_PROVIDER_SITE_OTHER): Payer: Medicare Other

## 2017-06-10 ENCOUNTER — Encounter: Payer: Self-pay | Admitting: Internal Medicine

## 2017-06-10 ENCOUNTER — Ambulatory Visit (INDEPENDENT_AMBULATORY_CARE_PROVIDER_SITE_OTHER): Payer: Medicare Other | Admitting: Internal Medicine

## 2017-06-10 DIAGNOSIS — M1991 Primary osteoarthritis, unspecified site: Secondary | ICD-10-CM | POA: Diagnosis not present

## 2017-06-10 DIAGNOSIS — G309 Alzheimer's disease, unspecified: Secondary | ICD-10-CM | POA: Insufficient documentation

## 2017-06-10 DIAGNOSIS — G903 Multi-system degeneration of the autonomic nervous system: Secondary | ICD-10-CM

## 2017-06-10 DIAGNOSIS — F0281 Dementia in other diseases classified elsewhere with behavioral disturbance: Secondary | ICD-10-CM

## 2017-06-10 DIAGNOSIS — F411 Generalized anxiety disorder: Secondary | ICD-10-CM | POA: Diagnosis not present

## 2017-06-10 DIAGNOSIS — D485 Neoplasm of uncertain behavior of skin: Secondary | ICD-10-CM | POA: Diagnosis not present

## 2017-06-10 DIAGNOSIS — F0280 Dementia in other diseases classified elsewhere without behavioral disturbance: Secondary | ICD-10-CM | POA: Insufficient documentation

## 2017-06-10 DIAGNOSIS — I951 Orthostatic hypotension: Secondary | ICD-10-CM

## 2017-06-10 DIAGNOSIS — G301 Alzheimer's disease with late onset: Secondary | ICD-10-CM

## 2017-06-10 DIAGNOSIS — F329 Major depressive disorder, single episode, unspecified: Secondary | ICD-10-CM

## 2017-06-10 DIAGNOSIS — N189 Chronic kidney disease, unspecified: Secondary | ICD-10-CM | POA: Diagnosis not present

## 2017-06-10 DIAGNOSIS — F0390 Unspecified dementia without behavioral disturbance: Secondary | ICD-10-CM

## 2017-06-10 DIAGNOSIS — I5030 Unspecified diastolic (congestive) heart failure: Secondary | ICD-10-CM | POA: Diagnosis not present

## 2017-06-10 DIAGNOSIS — J449 Chronic obstructive pulmonary disease, unspecified: Secondary | ICD-10-CM | POA: Diagnosis not present

## 2017-06-10 DIAGNOSIS — G908 Other disorders of autonomic nervous system: Secondary | ICD-10-CM | POA: Diagnosis not present

## 2017-06-10 LAB — BASIC METABOLIC PANEL
BUN: 16 mg/dL (ref 6–23)
CO2: 30 mEq/L (ref 19–32)
Calcium: 9.2 mg/dL (ref 8.4–10.5)
Chloride: 102 mEq/L (ref 96–112)
Creatinine, Ser: 1.02 mg/dL (ref 0.40–1.20)
GFR: 55.18 mL/min — ABNORMAL LOW (ref >60.00)
Glucose, Bld: 105 mg/dL — ABNORMAL HIGH (ref 70–99)
Potassium: 4.3 mEq/L (ref 3.5–5.1)
Sodium: 138 mEq/L (ref 135–145)

## 2017-06-10 MED ORDER — QUETIAPINE FUMARATE 25 MG PO TABS: 25.0000 mg | ORAL_TABLET | Freq: Every day | ORAL | 5 refills | Status: DC

## 2017-06-10 NOTE — Assessment & Plan Note (Signed)
Seroquel at hs Consider Namzaric

## 2017-06-10 NOTE — Assessment & Plan Note (Signed)
On Rx 

## 2017-06-10 NOTE — Assessment & Plan Note (Signed)
Derm ref - ?skin ca

## 2017-06-10 NOTE — Assessment & Plan Note (Signed)
seroquel low dose

## 2017-06-10 NOTE — Progress Notes (Signed)
Subjective:  Patient ID: Sabrina Page, female    DOB: July 09, 1933  Age: 84 y.o. MRN: [id number]  CC: No chief complaint on file.   HPI Sabrina Page presents for a growth on the R forehead  X 2 months C/o confusion,  Suspicious of other people, memory loss F/u HTN, orthostasis  Outpatient Medications Prior to Visit  Medication Sig Dispense Refill  . aspirin 81 MG EC tablet Take 1 tablet (81 mg total) by mouth 2 (two) times daily. 60 tablet 0  . B Complex-C (SUPER B COMPLEX PO) Take 1 tablet by mouth every morning.     . Cholecalciferol 1000 UNITS capsule Take 1,000 Units by mouth every morning.     . clonazePAM (KLONOPIN) 0.5 MG tablet TAKE 1 TABLET BY MOUTH TWICE A DAY AS NEEDED FOR ANXIETY 10 tablet 0  . fludrocortisone (FLORINEF) 0.1 MG tablet Take 1 tablet (0.1 mg total) by mouth daily. 30 tablet 5  . FLUoxetine (PROZAC) 40 MG capsule Take 1 capsule (40 mg total) by mouth daily. 90 capsule 3  . KLOR-CON 8 MEQ tablet TAKE 1 TABLET EVERY MORNING 30 tablet 0  . loratadine (CLARITIN) 10 MG tablet Take 1 tablet (10 mg total) by mouth every morning. 100 tablet 3  . midodrine (PROAMATINE) 10 MG tablet TAKE 1 TABLET (10 MG TOTAL) BY MOUTH 3 (THREE) TIMES DAILY. 90 tablet 5  . Omega-3 Fatty Acids (FISH OIL) 1000 MG CAPS Take 1 capsule by mouth every morning.     Marland Kitchen omeprazole (PRILOSEC) 20 MG capsule Take 2 capsules (40 mg total) by mouth daily. 180 capsule 3  . polyethylene glycol (MIRALAX / GLYCOLAX) packet Take 17 g by mouth daily as needed for mild constipation.  0  . pyridostigmine (MESTINON) 60 MG tablet Take 1 tablet (60 mg total) by mouth 3 (three) times daily. 90 tablet 11  . traMADol (ULTRAM) 50 MG tablet Take 1 tablet (50 mg total) by mouth every 12 (twelve) hours as needed. 10 tablet 0   No facility-administered medications prior to visit.     ROS Review of Systems  Constitutional: Positive for fever. Negative for activity change, appetite change, chills,  fatigue and unexpected weight change.  HENT: Negative for congestion, mouth sores and sinus pressure.   Eyes: Negative for visual disturbance.  Respiratory: Negative for cough and chest tightness.   Gastrointestinal: Negative for abdominal pain and nausea.  Genitourinary: Negative for difficulty urinating, frequency and vaginal pain.  Musculoskeletal: Positive for back pain. Negative for gait problem.  Skin: Negative for pallor and rash.  Neurological: Positive for dizziness and light-headedness. Negative for tremors, syncope, weakness, numbness and headaches.  Psychiatric/Behavioral: Positive for confusion, decreased concentration and dysphoric mood. Negative for sleep disturbance and suicidal ideas. The patient is nervous/anxious.     Objective:  BP (!) 160/108 (BP Location: Left Arm, Patient Position: Sitting, Cuff Size: Large)   Pulse 67   Temp 98.2 F (36.8 C) (Oral)   Ht 5\' 4"  (1.626 m)   Wt 220 lb (99.8 kg)   SpO2 98%   BMI 37.76 kg/m   BP Readings from Last 3 Encounters:  06/10/17 (!) 160/108  04/28/17 140/84  03/23/17 (!) 159/69    Wt Readings from Last 3 Encounters:  06/10/17 220 lb (99.8 kg)  03/21/17 222 lb (100.7 kg)  03/17/17 222 lb (100.7 kg)    Physical Exam  Constitutional: She appears well-developed. No distress.  HENT:  Head: Normocephalic.  Right Ear: External ear normal.  Left  Ear: External ear normal.  Nose: Nose normal.  Mouth/Throat: Oropharynx is clear and moist.  Eyes: Conjunctivae are normal. Pupils are equal, round, and reactive to light. Right eye exhibits no discharge. Left eye exhibits no discharge.  Neck: Normal range of motion. Neck supple. No JVD present. No tracheal deviation present. No thyromegaly present.  Cardiovascular: Normal rate, regular rhythm and normal heart sounds.  Pulmonary/Chest: No stridor. No respiratory distress. She has no wheezes.  Abdominal: Soft. Bowel sounds are normal. She exhibits no distension and no mass.  There is no tenderness. There is no rebound and no guarding.  Musculoskeletal: She exhibits tenderness. She exhibits no edema.  Lymphadenopathy:    She has no cervical adenopathy.  Neurological: She displays normal reflexes. No cranial nerve deficit. She exhibits normal muscle tone. Coordination abnormal.  Skin: No rash noted. No erythema.  Psychiatric: Her behavior is normal. Thought content normal.  LS tender In a w/c 7 x 8 mm ulcer on the forehead  Lab Results  Component Value Date   WBC 5.4 03/22/2017   HGB 13.4 03/22/2017   HCT 42.5 03/22/2017   PLT 169 03/22/2017   GLUCOSE 92 03/22/2017   CHOL 270 (H) 06/12/2014   TRIG 132.0 06/12/2014   HDL 52.40 06/12/2014   LDLDIRECT 159.1 05/16/2012   LDLCALC 191 (H) 06/12/2014   ALT 16 03/19/2017   AST 23 03/19/2017   NA 140 03/22/2017   K 4.2 03/22/2017   CL 105 03/22/2017   CREATININE 0.88 03/22/2017   BUN 10 03/22/2017   CO2 28 03/22/2017   TSH 1.71 09/23/2016   INR 1.07 04/16/2016   HGBA1C 5.6 12/25/2015    Dg Lumbar Spine Complete  Result Date: 03/19/2017 CLINICAL DATA:  Weakness with back pain. Multiple falls over the past 2 days. Initial encounter. 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 Negative for acute fracture or traumatic malalignment. Lower lumbar facet arthropathy most notable at L4-5 where there is grade 1 anterolisthesis. Generalized spondylosis and degenerative disc narrowing, greatest at L4-5. Osteopenia. Atherosclerosis. IMPRESSION: 1. No acute finding. 2. Lumbar spine degeneration with L4-5 anterolisthesis. Electronically Signed   By: Monte Fantasia M.D.   On: 03/19/2017 13:40   Ct Head Wo Contrast  Result Date: 03/19/2017 CLINICAL DATA:  Fall last night. High clinical risk. Initial encounter. EXAM: CT HEAD WITHOUT CONTRAST CT CERVICAL SPINE WITHOUT CONTRAST TECHNIQUE: Multidetector CT  imaging of the head and cervical spine was performed following the standard protocol without intravenous contrast. Multiplanar CT image reconstructions of the cervical spine were also generated. COMPARISON:  Brain MRI 03/02/2016. Head and cervical spine CT 05/10/2010 FINDINGS: CT HEAD FINDINGS Brain: No evidence of acute infarction, hemorrhage, hydrocephalus, extra-axial collection or mass lesion/mass effect. Atrophy with ventriculomegaly. Mild for age chronic small vessel ischemia. Vascular: Atherosclerotic calcification.  No hyperdense vessel. Skull: Negative for fracture. Sinuses/Orbits: No evidence of injury CT CERVICAL SPINE FINDINGS Alignment: No traumatic malalignment. Degenerative reversal of cervical lordosis that is progressed from 2011. Mild C7-T1 anterolisthesis. Skull base and vertebrae: Negative for fracture. Soft tissues and spinal canal: No prevertebral fluid or swelling. No visible canal hematoma. Bilateral thyroid nodules measuring up to 1 cm, incidental based on size and demographics. Disc levels:  Diffuse disc and facet degeneration. Upper chest: No acute finding. IMPRESSION: No evidence of acute intracranial or cervical spine injury. Electronically Signed   By: Neva Seat.D.  On: 03/19/2017 14:06   Ct Cervical Spine Wo Contrast  Result Date: 03/19/2017 CLINICAL DATA:  Fall last night. High clinical risk. Initial encounter. EXAM: CT HEAD WITHOUT CONTRAST CT CERVICAL SPINE WITHOUT CONTRAST TECHNIQUE: Multidetector CT imaging of the head and cervical spine was performed following the standard protocol without intravenous contrast. Multiplanar CT image reconstructions of the cervical spine were also generated. COMPARISON:  Brain MRI 03/02/2016. Head and cervical spine CT 05/10/2010 FINDINGS: CT HEAD FINDINGS Brain: No evidence of acute infarction, hemorrhage, hydrocephalus, extra-axial collection or mass lesion/mass effect. Atrophy with ventriculomegaly. Mild for age chronic small  vessel ischemia. Vascular: Atherosclerotic calcification.  No hyperdense vessel. Skull: Negative for fracture. Sinuses/Orbits: No evidence of injury CT CERVICAL SPINE FINDINGS Alignment: No traumatic malalignment. Degenerative reversal of cervical lordosis that is progressed from 2011. Mild C7-T1 anterolisthesis. Skull base and vertebrae: Negative for fracture. Soft tissues and spinal canal: No prevertebral fluid or swelling. No visible canal hematoma. Bilateral thyroid nodules measuring up to 1 cm, incidental based on size and demographics. Disc levels:  Diffuse disc and facet degeneration. Upper chest: No acute finding. IMPRESSION: No evidence of acute intracranial or cervical spine injury. Electronically Signed   By: Monte Fantasia M.D.   On: 03/19/2017 14:06    Assessment & Plan:   There are no diagnoses linked to this encounter. I am having Malaiya Nile maintain her Fish Oil, Cholecalciferol, B Complex-C (SUPER B COMPLEX PO), FLUoxetine, omeprazole, loratadine, aspirin, midodrine, pyridostigmine, KLOR-CON, traMADol, polyethylene glycol, clonazePAM, and fludrocortisone.  No orders of the defined types were placed in this encounter.    Follow-up: No Follow-up on file.  [name], MD

## 2017-06-10 NOTE — Assessment & Plan Note (Signed)
Clonazepam at night if tolerated

## 2017-06-14 DIAGNOSIS — F329 Major depressive disorder, single episode, unspecified: Secondary | ICD-10-CM | POA: Diagnosis not present

## 2017-06-14 DIAGNOSIS — G908 Other disorders of autonomic nervous system: Secondary | ICD-10-CM | POA: Diagnosis not present

## 2017-06-14 DIAGNOSIS — J449 Chronic obstructive pulmonary disease, unspecified: Secondary | ICD-10-CM | POA: Diagnosis not present

## 2017-06-14 DIAGNOSIS — M1991 Primary osteoarthritis, unspecified site: Secondary | ICD-10-CM | POA: Diagnosis not present

## 2017-06-14 DIAGNOSIS — I5030 Unspecified diastolic (congestive) heart failure: Secondary | ICD-10-CM | POA: Diagnosis not present

## 2017-06-14 DIAGNOSIS — N189 Chronic kidney disease, unspecified: Secondary | ICD-10-CM | POA: Diagnosis not present

## 2017-06-17 ENCOUNTER — Other Ambulatory Visit: Payer: Self-pay | Admitting: *Deleted

## 2017-06-17 NOTE — Patient Outreach (Signed)
[place] [facility]) Care Management  06/17/2017  Sabrina Page Apr 18, 1934 [id number]   Call placed to member's daughter to follow up on current health status with plan to close if no further nursing concerns.  She state that the member's and her husband's health (particularly their memory) continue to decline.  She state the member is having to help her husband more daily, which wears on her own body.  Daughter state she feel that the member's memory is beginning to decline as well.  She state she is doing the best she can to provide support along with personal care aides, however she think they need additional hours.  She is made aware that 24 hour assistance will have to be paid out of pocket.  She verbalizes understanding but state she is unable to pay more out of pocket for additional help.  She denies being contacted by [facility] as of yet, remain on their waiting list.  This care manager inquired about consideration of other facilities as both the member and husband are showing more signs of needing additional help and may not be able to wait much longer for wait list.  She agrees, open to discussing more options with LCSW.  Will place new referral.  She still deny any nursing needs at this time as member continue to be compliant with medications and MD visits.  Only concern at this time is additional support.  Will collaborate with LCSW to provide support.  [name], [place], MSN [place] [phone]

## 2017-06-20 ENCOUNTER — Other Ambulatory Visit: Payer: Self-pay | Admitting: Licensed Clinical Social Worker

## 2017-06-20 NOTE — Patient Outreach (Addendum)
[place] [facility]) Care Management  06/20/2017  Sabrina Page 08/23/1933 [id number]  Assessment-CSW completed outreach attempt today after receiving message from [place]. Family is interested in possible placement (other than [facility] SN[facility]). CSW unable to reach patient's daughter successfully. CSW left a HIPPA compliant voice message encouraging patient's daughter to return call once available.  Plan-CSW will await return call from patient.   Eula Fried, BSW, MSW, [email or web address] Phone: [phone] Fax: [phone]

## 2017-06-23 ENCOUNTER — Encounter: Payer: Self-pay | Admitting: Internal Medicine

## 2017-06-23 ENCOUNTER — Ambulatory Visit (INDEPENDENT_AMBULATORY_CARE_PROVIDER_SITE_OTHER): Payer: Medicare Other | Admitting: Internal Medicine

## 2017-06-23 DIAGNOSIS — F0281 Dementia in other diseases classified elsewhere with behavioral disturbance: Secondary | ICD-10-CM

## 2017-06-23 DIAGNOSIS — G903 Multi-system degeneration of the autonomic nervous system: Secondary | ICD-10-CM | POA: Diagnosis not present

## 2017-06-23 DIAGNOSIS — I10 Essential (primary) hypertension: Secondary | ICD-10-CM | POA: Diagnosis not present

## 2017-06-23 DIAGNOSIS — D485 Neoplasm of uncertain behavior of skin: Secondary | ICD-10-CM

## 2017-06-23 DIAGNOSIS — F411 Generalized anxiety disorder: Secondary | ICD-10-CM | POA: Diagnosis not present

## 2017-06-23 DIAGNOSIS — I951 Orthostatic hypotension: Secondary | ICD-10-CM

## 2017-06-23 DIAGNOSIS — G301 Alzheimer's disease with late onset: Secondary | ICD-10-CM

## 2017-06-23 DIAGNOSIS — F02818 Dementia in other diseases classified elsewhere, unspecified severity, with other behavioral disturbance: Secondary | ICD-10-CM

## 2017-06-23 MED ORDER — TRAMADOL HCL 50 MG PO TABS: 50.0000 mg | ORAL_TABLET | Freq: Two times a day (BID) | ORAL | 2 refills | Status: DC | PRN

## 2017-06-23 MED ORDER — QUETIAPINE FUMARATE 25 MG PO TABS: 25.0000 mg | ORAL_TABLET | Freq: Two times a day (BID) | ORAL | 5 refills | Status: DC

## 2017-06-23 MED ORDER — MEMANTINE HCL-DONEPEZIL HCL 7-10 MG PO CP24
1.0000 | ORAL_CAPSULE | Freq: Every day | ORAL | 5 refills | Status: DC
Start: 2017-06-23 — End: 2017-09-20

## 2017-06-23 MED ORDER — FLUOXETINE HCL 40 MG PO CAPS: 40.0000 mg | ORAL_CAPSULE | Freq: Every day | ORAL | 3 refills | Status: DC

## 2017-06-23 MED ORDER — OMEPRAZOLE 20 MG PO CPDR: 40.0000 mg | DELAYED_RELEASE_CAPSULE | Freq: Every day | ORAL | 3 refills | Status: DC

## 2017-06-23 MED ORDER — FLUDROCORTISONE ACETATE 0.1 MG PO TABS: 0.1000 mg | ORAL_TABLET | Freq: Every day | ORAL | 3 refills | Status: DC

## 2017-06-23 MED ORDER — MIDODRINE HCL 10 MG PO TABS: 10.0000 mg | ORAL_TABLET | Freq: Three times a day (TID) | ORAL | 3 refills | Status: DC

## 2017-06-23 MED ORDER — POTASSIUM CHLORIDE ER 8 MEQ PO TBCR: 8.0000 meq | EXTENDED_RELEASE_TABLET | Freq: Every morning | ORAL | 3 refills | Status: DC

## 2017-06-23 MED ORDER — CLONAZEPAM 0.5 MG PO TABS: ORAL_TABLET | ORAL | 2 refills | Status: DC

## 2017-06-23 MED ORDER — PYRIDOSTIGMINE BROMIDE 60 MG PO TABS: 60.0000 mg | ORAL_TABLET | Freq: Three times a day (TID) | ORAL | 3 refills | Status: DC

## 2017-06-23 NOTE — Assessment & Plan Note (Signed)
bx pending.  

## 2017-06-23 NOTE — Assessment & Plan Note (Signed)
Chronic. 

## 2017-06-23 NOTE — Assessment & Plan Note (Signed)
BP Readings from Last 3 Encounters:  06/23/17 118/64  06/10/17 (!) 160/108  04/28/17 140/84

## 2017-06-23 NOTE — Assessment & Plan Note (Addendum)
seroquel is helping - start bid

## 2017-06-23 NOTE — Assessment & Plan Note (Signed)
Start [employer]

## 2017-06-23 NOTE — Progress Notes (Signed)
Subjective:  Patient ID: Sabrina Page, female    DOB: Jun 21, 1933  Age: 84 y.o. MRN: [id number]  CC: No chief complaint on file.   HPI Sabrina Page presents for probable skin cancer, anxiety, syncopy, anxiety, dementia. Seroquel helped w/paranoia.  C/o LBP  Outpatient Medications Prior to Visit  Medication Sig Dispense Refill  . aspirin 81 MG EC tablet Take 1 tablet (81 mg total) by mouth 2 (two) times daily. 60 tablet 0  . B Complex-C (SUPER B COMPLEX PO) Take 1 tablet by mouth every morning.     . Cholecalciferol 1000 UNITS capsule Take 1,000 Units by mouth every morning.     . clonazePAM (KLONOPIN) 0.5 MG tablet TAKE 1 TABLET BY MOUTH TWICE A DAY AS NEEDED FOR ANXIETY 10 tablet 0  . fludrocortisone (FLORINEF) 0.1 MG tablet Take 1 tablet (0.1 mg total) by mouth daily. 30 tablet 5  . FLUoxetine (PROZAC) 40 MG capsule Take 1 capsule (40 mg total) by mouth daily. 90 capsule 3  . KLOR-CON 8 MEQ tablet TAKE 1 TABLET EVERY MORNING 30 tablet 0  . loratadine (CLARITIN) 10 MG tablet Take 1 tablet (10 mg total) by mouth every morning. 100 tablet 3  . midodrine (PROAMATINE) 10 MG tablet TAKE 1 TABLET (10 MG TOTAL) BY MOUTH 3 (THREE) TIMES DAILY. 90 tablet 5  . Omega-3 Fatty Acids (FISH OIL) 1000 MG CAPS Take 1 capsule by mouth every morning.     Marland Kitchen omeprazole (PRILOSEC) 20 MG capsule Take 2 capsules (40 mg total) by mouth daily. 180 capsule 3  . pyridostigmine (MESTINON) 60 MG tablet Take 1 tablet (60 mg total) by mouth 3 (three) times daily. 90 tablet 11  . QUEtiapine (SEROQUEL) 25 MG tablet Take 1 tablet (25 mg total) by mouth at bedtime. 30 tablet 5  . traMADol (ULTRAM) 50 MG tablet Take 1 tablet (50 mg total) by mouth every 12 (twelve) hours as needed. 10 tablet 0  . polyethylene glycol (MIRALAX / GLYCOLAX) packet Take 17 g by mouth daily as needed for mild constipation.  0   No facility-administered medications prior to visit.     ROS Review of Systems  Constitutional:  Positive for fatigue. Negative for activity change, appetite change, chills and unexpected weight change.  HENT: Negative for congestion, mouth sores and sinus pressure.   Eyes: Negative for visual disturbance.  Respiratory: Negative for cough and chest tightness.   Gastrointestinal: Negative for abdominal pain and nausea.  Genitourinary: Negative for difficulty urinating, frequency and vaginal pain.  Musculoskeletal: Positive for arthralgias, back pain and gait problem.  Skin: Negative for pallor and rash.  Neurological: Negative for dizziness, tremors, weakness, numbness and headaches.  Psychiatric/Behavioral: Positive for confusion, decreased concentration, dysphoric mood and sleep disturbance. The patient is nervous/anxious.     Objective:  BP 118/64 (BP Location: Left Arm, Patient Position: Sitting, Cuff Size: Large)   Pulse 73   Temp 98.3 F (36.8 C) (Oral)   Ht 5\' 4"  (1.626 m)   Wt 221 lb (100.2 kg)   SpO2 98%   BMI 37.93 kg/m   BP Readings from Last 3 Encounters:  06/23/17 118/64  06/10/17 (!) 160/108  04/28/17 140/84    Wt Readings from Last 3 Encounters:  06/23/17 221 lb (100.2 kg)  06/10/17 220 lb (99.8 kg)  03/21/17 222 lb (100.7 kg)    Physical Exam  Constitutional: She appears well-developed. No distress.  HENT:  Head: Normocephalic.  Right Ear: External ear normal.  Left Ear: External  ear normal.  Nose: Nose normal.  Mouth/Throat: Oropharynx is clear and moist.  Eyes: Conjunctivae are normal. Pupils are equal, round, and reactive to light. Right eye exhibits no discharge. Left eye exhibits no discharge.  Neck: Normal range of motion. Neck supple. No JVD present. No tracheal deviation present. No thyromegaly present.  Cardiovascular: Normal rate, regular rhythm and normal heart sounds.  Pulmonary/Chest: No stridor. No respiratory distress. She has no wheezes.  Abdominal: Soft. Bowel sounds are normal. She exhibits no distension and no mass. There is no  tenderness. There is no rebound and no guarding.  Musculoskeletal: She exhibits tenderness. She exhibits no edema.  Lymphadenopathy:    She has no cervical adenopathy.  Neurological: She displays normal reflexes. No cranial nerve deficit. She exhibits normal muscle tone. Coordination abnormal.  Skin: No rash noted. No erythema.  Psychiatric: She has a normal mood and affect. Her behavior is normal.  Obese In a w/c Forehead skin lesion  Lab Results  Component Value Date   WBC 5.4 03/22/2017   HGB 13.4 03/22/2017   HCT 42.5 03/22/2017   PLT 169 03/22/2017   GLUCOSE 105 (H) 06/10/2017   CHOL 270 (H) 06/12/2014   TRIG 132.0 06/12/2014   HDL 52.40 06/12/2014   LDLDIRECT 159.1 05/16/2012   LDLCALC 191 (H) 06/12/2014   ALT 16 03/19/2017   AST 23 03/19/2017   NA 138 06/10/2017   K 4.3 06/10/2017   CL 102 06/10/2017   CREATININE 1.02 06/10/2017   BUN 16 06/10/2017   CO2 30 06/10/2017   TSH 1.71 09/23/2016   INR 1.07 04/16/2016   HGBA1C 5.6 12/25/2015    Dg Lumbar Spine Complete  Result Date: 03/19/2017 CLINICAL DATA:  Weakness with back pain. Multiple falls over the past 2 days. Initial encounter. 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 Negative for acute fracture or traumatic malalignment. Lower lumbar facet arthropathy most notable at L4-5 where there is grade 1 anterolisthesis. Generalized spondylosis and degenerative disc narrowing, greatest at L4-5. Osteopenia. Atherosclerosis. IMPRESSION: 1. No acute finding. 2. Lumbar spine degeneration with L4-5 anterolisthesis. Electronically Signed   By: Monte Fantasia M.D.   On: 03/19/2017 13:40   Ct Head Wo Contrast  Result Date: 03/19/2017 CLINICAL DATA:  Fall last night. High clinical risk. Initial encounter. EXAM: CT HEAD WITHOUT CONTRAST CT CERVICAL SPINE WITHOUT CONTRAST TECHNIQUE: Multidetector CT imaging of the  head and cervical spine was performed following the standard protocol without intravenous contrast. Multiplanar CT image reconstructions of the cervical spine were also generated. COMPARISON:  Brain MRI 03/02/2016. Head and cervical spine CT 05/10/2010 FINDINGS: CT HEAD FINDINGS Brain: No evidence of acute infarction, hemorrhage, hydrocephalus, extra-axial collection or mass lesion/mass effect. Atrophy with ventriculomegaly. Mild for age chronic small vessel ischemia. Vascular: Atherosclerotic calcification.  No hyperdense vessel. Skull: Negative for fracture. Sinuses/Orbits: No evidence of injury CT CERVICAL SPINE FINDINGS Alignment: No traumatic malalignment. Degenerative reversal of cervical lordosis that is progressed from 2011. Mild C7-T1 anterolisthesis. Skull base and vertebrae: Negative for fracture. Soft tissues and spinal canal: No prevertebral fluid or swelling. No visible canal hematoma. Bilateral thyroid nodules measuring up to 1 cm, incidental based on size and demographics. Disc levels:  Diffuse disc and facet degeneration. Upper chest: No acute finding. IMPRESSION: No evidence of acute intracranial or cervical spine injury. Electronically Signed   By: Monte Fantasia M.D.   On: 03/19/2017 14:06  Ct Cervical Spine Wo Contrast  Result Date: 03/19/2017 CLINICAL DATA:  Fall last night. High clinical risk. Initial encounter. EXAM: CT HEAD WITHOUT CONTRAST CT CERVICAL SPINE WITHOUT CONTRAST TECHNIQUE: Multidetector CT imaging of the head and cervical spine was performed following the standard protocol without intravenous contrast. Multiplanar CT image reconstructions of the cervical spine were also generated. COMPARISON:  Brain MRI 03/02/2016. Head and cervical spine CT 05/10/2010 FINDINGS: CT HEAD FINDINGS Brain: No evidence of acute infarction, hemorrhage, hydrocephalus, extra-axial collection or mass lesion/mass effect. Atrophy with ventriculomegaly. Mild for age chronic small vessel ischemia.  Vascular: Atherosclerotic calcification.  No hyperdense vessel. Skull: Negative for fracture. Sinuses/Orbits: No evidence of injury CT CERVICAL SPINE FINDINGS Alignment: No traumatic malalignment. Degenerative reversal of cervical lordosis that is progressed from 2011. Mild C7-T1 anterolisthesis. Skull base and vertebrae: Negative for fracture. Soft tissues and spinal canal: No prevertebral fluid or swelling. No visible canal hematoma. Bilateral thyroid nodules measuring up to 1 cm, incidental based on size and demographics. Disc levels:  Diffuse disc and facet degeneration. Upper chest: No acute finding. IMPRESSION: No evidence of acute intracranial or cervical spine injury. Electronically Signed   By: Monte Fantasia M.D.   On: 03/19/2017 14:06    Assessment & Plan:   There are no diagnoses linked to this encounter. I have discontinued Lyrical Raya's polyethylene glycol. I am also having her maintain her Fish Oil, Cholecalciferol, B Complex-C (SUPER B COMPLEX PO), FLUoxetine, omeprazole, loratadine, aspirin, midodrine, pyridostigmine, KLOR-CON, traMADol, clonazePAM, fludrocortisone, and QUEtiapine.  No orders of the defined types were placed in this encounter.    Follow-up: No Follow-up on file.  [name], MD

## 2017-06-27 ENCOUNTER — Other Ambulatory Visit: Payer: Self-pay | Admitting: Licensed Clinical Social Worker

## 2017-06-27 NOTE — Patient Outreach (Signed)
[place] [facility]) Care Management  06/27/2017  Sabrina Page [birth year] [id number]  Assessment- CSW completed outreach call to patient's daughter to discuss LTC placement options as [facility] RNCM reports family may be interested in other facilities for LTC placement. Patient's daughter answered. HIPPA verifications provided. She reports that things were stressful for 2 weeks because patient and spouse's aide had to take some time off to complete some certification. Daughter reports that family had a replacement aide and this was not helpful to their everyday routine. However, family's aide has returned and is now with family daily. Patient's daughter reports that things are getting better now that they have the aide back. Aide comes daily from 9 am- 2 pm. Daughter reports that aide is wonderful. Patient's daughter reports that she has also hired someone to come out daily from 8 am - 9 am to prepare meals for the day and to go grocery shopping for patient and spouse. CSW questioned if daughter had contacted [place] facility since dropping off application and she denied. CSW strongly encouraged daughter to contact facility to make sure that the application was successfully completed and that both patient and spouse were able to be placed on the wait list. CSW encouraged daughter that she will also need to get an estimate as to how long family members will be on the wait list. Daughter is agreeable to do this. Daughter reports that patient and spouse are not agreeable to LTC placement at any other facility besides [facility] so CSW is unable to assist with finding LTC placement elsewhere at this time. CSW provided emotional support over the phone and assisted daughter with decision-making. Daughter was very receptive to this. Daughter was encouraged to contact this CSW at anytime with any future social work needs. CSW will not open case at this time as patient only wants LTC placement at [facility].  CSW sent text message to daughter [name] with reminder to contact [place] facility and gain updates on wait list.  Plan-CSW will update [facility] RNCM and not open case at this time.  Eula Fried, BSW, MSW, [email or web address] Phone: [phone] Fax: [phone]

## 2017-06-28 DIAGNOSIS — C44319 Basal cell carcinoma of skin of other parts of face: Secondary | ICD-10-CM | POA: Diagnosis not present

## 2017-06-28 DIAGNOSIS — D2239 Melanocytic nevi of other parts of face: Secondary | ICD-10-CM | POA: Diagnosis not present

## 2017-06-28 DIAGNOSIS — L814 Other melanin hyperpigmentation: Secondary | ICD-10-CM | POA: Diagnosis not present

## 2017-07-01 ENCOUNTER — Other Ambulatory Visit: Payer: Self-pay | Admitting: *Deleted

## 2017-07-01 ENCOUNTER — Encounter: Payer: Self-pay | Admitting: *Deleted

## 2017-07-01 NOTE — Patient Outreach (Signed)
[place] [facility]) Care Management  07/01/2017  Olukemi Panchal [birth year] [id number]   Call placed to member's daughter to follow up on current health status and progress with placement.  Per LCSW note, member and family only interested in [place], advised to keep in contact with facility for status of wait list.  Daughter report member is doing "much better."  She has the aid back that they are comfortable with.  Daughter unable to speak long as she is with a patient, but report things are better.  There are no more nursing concerns at this time, will close case.  Member has contact information for both this care manager and LCSW in case assistance is needed in the future.  Will notify care management assistant and primary MD of case closure.  [name], [place], MSN [place] [phone]

## 2017-07-06 ENCOUNTER — Ambulatory Visit: Payer: Self-pay

## 2017-07-06 NOTE — Progress Notes (Deleted)
Subjective:    Patient ID: Sabrina Page, female    DOB: February 18, 1934, 84 y.o.   MRN: [id number]  HPI The patient is here for an acute visit.  Elevated BP: BP yesterday was 180/90.  She is taking her medication daily. She is compliant with a low sodium diet.  She denies chest pain, palpitations, edema, shortness of breath and regular headaches.   She does not monitor her blood pressure at home.    Nausea, diarrhea:  Her symptoms started thee days ago.    Medications and allergies reviewed with patient and updated if appropriate.  Patient Active Problem List   Diagnosis Date Noted  . Dementia 06/10/2017  . Orthostatic hypotension dysautonomic syndrome ([place]) 03/20/2017  . Knee pain, right 12/27/2016  . Fall   . Closed right hip fracture ([place]) 04/15/2016  . Hip fracture ([place]) 04/15/2016  . Syncope due to orthostatic hypotension 01/18/2016  . Orthostatic hypotension 01/18/2016  . Hip pain, chronic 12/25/2015  . Carotid bruit 06/26/2015  . Fall at home 02/01/2015  . Concussion without loss of consciousness 09/13/2014  . Morbid obesity ([place]) 03/06/2014  . Right tennis elbow 01/02/2014  . Wart 01/02/2014  . Chronic fatigue 06/25/2013  . External hemorrhoid, bleeding 02/14/2013  . OSA on CPAP 02/10/2012  . Low back pain radiating to both legs 05/14/2011  . Falls frequently 05/14/2011  . Fatigue 04/22/2011  . Hyperglycemia 04/22/2011  . Neoplasm of uncertain behavior of skin 01/11/2011  . Anxiety state 07/06/2010  . SHOULDER PAIN 05/21/2010  . CONCUSSION WITH LOSS OF CONSCIOUSNESS 05/21/2010  . Actinic keratosis 04/08/2010  . CARPAL TUNNEL SYNDROME 11/28/2009  . NECK PAIN 11/28/2009  . DIZZINESS 11/28/2009  . INSOMNIA, CHRONIC 06/23/2009  . TOBACCO USE, QUIT 04/11/2009  . DIVERTICULOSIS OF COLON 09/06/2008  . DIVERTICULITIS OF COLON 09/06/2008  . Unspecified chest pain 09/06/2008  . GERD 06/05/2008  . OSTEOARTHRITIS 06/05/2008  . ABNORMAL GLUCOSE NEC 06/05/2008    . BRONCHITIS, ACUTE 04/04/2008  . APHTHOUS STOMATITIS 03/25/2008  . Venous (peripheral) insufficiency 11/03/2007  . Edema 11/03/2007  . SHINGLES 10/23/2007  . DENTAL PAIN 10/23/2007  . PARESTHESIA 10/23/2007  . Pain in limb 07/06/2007  . HYPERLIPIDEMIA 02/17/2007  . Depression 02/17/2007  . Essential hypertension 02/17/2007  . ALLERGIC RHINITIS 02/17/2007  . Asthma, moderate persistent, well-controlled 02/17/2007  . Dyspnea on exertion 02/17/2007  . SYMPTOM, INCONTINENCE, URGE 02/17/2007    Current Outpatient Medications on File Prior to Visit  Medication Sig Dispense Refill  . aspirin 81 MG EC tablet Take 1 tablet (81 mg total) by mouth 2 (two) times daily. 60 tablet 0  . B Complex-C (SUPER B COMPLEX PO) Take 1 tablet by mouth every morning.     . Cholecalciferol 1000 UNITS capsule Take 1,000 Units by mouth every morning.     . clonazePAM (KLONOPIN) 0.5 MG tablet TAKE 1 TABLET BY MOUTH TWICE A DAY AS NEEDED FOR ANXIETY 60 tablet 2  . fludrocortisone (FLORINEF) 0.1 MG tablet Take 1 tablet (0.1 mg total) by mouth daily. 90 tablet 3  . FLUoxetine (PROZAC) 40 MG capsule Take 1 capsule (40 mg total) by mouth daily. 90 capsule 3  . loratadine (CLARITIN) 10 MG tablet Take 1 tablet (10 mg total) by mouth every morning. 100 tablet 3  . Memantine HCl-Donepezil HCl (NAMZARIC) 7-10 MG CP24 Take 1 capsule by mouth daily. 30 capsule 5  . midodrine (PROAMATINE) 10 MG tablet Take 1 tablet (10 mg total) by mouth 3 (three) times daily.  270 tablet 3  . Omega-3 Fatty Acids (FISH OIL) 1000 MG CAPS Take 1 capsule by mouth every morning.     Marland Kitchen omeprazole (PRILOSEC) 20 MG capsule Take 2 capsules (40 mg total) by mouth daily. 180 capsule 3  . potassium chloride (KLOR-CON) 8 MEQ tablet Take 1 tablet (8 mEq total) by mouth every morning. 90 tablet 3  . pyridostigmine (MESTINON) 60 MG tablet Take 1 tablet (60 mg total) by mouth 3 (three) times daily. 270 tablet 3  . QUEtiapine (SEROQUEL) 25 MG tablet Take 1  tablet (25 mg total) by mouth 2 (two) times daily. 60 tablet 5  . traMADol (ULTRAM) 50 MG tablet Take 1 tablet (50 mg total) by mouth every 12 (twelve) hours as needed for severe pain. 100 tablet 2   No current facility-administered medications on file prior to visit.     Past Medical History:  Diagnosis Date  . Allergy    rhinitis  . Asthma   . Chronic kidney disease    hx of cystitis   . COPD (chronic obstructive pulmonary disease) ([place])   . Depression   . Dizziness    vertigo   . Gallstones   . GERD (gastroesophageal reflux disease)   . Hyperlipidemia   . Hypertension   . Obesity   . Osteoarthritis   . Osteoporosis   . Peripheral vascular disease ([facility])    swelling   . Pneumonia   . Shortness of breath    with exertion   . Shoulder injury    left  . Sleep apnea    never diagnosed   . Urinary incontinence     Past Surgical History:  Procedure Laterality Date  . CHOLECYSTECTOMY  09/14/2011   Procedure: LAPAROSCOPIC CHOLECYSTECTOMY;  Surgeon: Odis Hollingshead, MD;  Location: [facility];  Service: General;  Laterality: N/A;  attempted intraoperative cholangiogram   . HIP ARTHROPLASTY Right 04/17/2016   Procedure: ARTHROPLASTY BIPOLAR HIP (HEMIARTHROPLASTY);  Surgeon: Paralee Cancel, MD;  Location: [facility];  Service: Orthopedics;  Laterality: Right;  . L TKR    . LIVER BIOPSY  09/14/2011   Procedure: LIVER BIOPSY;  Surgeon: Odis Hollingshead, MD;  Location: [facility];  Service: General;  Laterality: N/A;  . OTHER SURGICAL HISTORY     left leg surgery   . OTHER SURGICAL HISTORY     left breast surgery due to mastitis   . right left knee arthroplast      Social History   Socioeconomic History  . Marital status: Married    Spouse name: Not on file  . Number of children: 2  . Years of education: Not on file  . Highest education level: Not on file  Social Needs  . Financial resource strain: Not on file  . Food insecurity - worry: Not on file  . Food insecurity - inability:  Not on file  . Transportation needs - medical: Not on file  . Transportation needs - non-medical: Not on file  Occupational History  . Occupation: Retired  Tobacco Use  . Smoking status: Former Smoker    Packs/day: 0.50    Years: 35.00    Pack years: 17.50    Types: Cigarettes    Last attempt to quit: 05/24/2001    Years since quitting: 16.1  . Smokeless tobacco: Never Used  Substance and Sexual Activity  . Alcohol use: Yes    Comment: socially   . Drug use: No  . Sexual activity: Not Currently  Other Topics Concern  .  Not on file  Social History Narrative   Lives with husband in an apartment on the 3rd floor.  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  Has 2 children.  Education: college.      Family History  Problem Relation Age of Onset  . Hypertension Mother   . Arthritis Mother   . Diabetes Mother   . Stroke Mother   . Stroke Father   . Hypertension Father   . Hypertension Other   . CAD Neg Hx     Review of Systems     Objective:  There were no vitals filed for this visit. Wt Readings from Last 3 Encounters:  06/23/17 221 lb (100.2 kg)  06/10/17 220 lb (99.8 kg)  03/21/17 222 lb (100.7 kg)   There is no height or weight on file to calculate BMI.   Physical Exam         Assessment & Plan:    See Problem List for Assessment and Plan of chronic medical problems.

## 2017-07-06 NOTE — Telephone Encounter (Signed)
Daughter reports she does not know why BP is elevated. Has had some nausea and diarrhea x 2 days. If symptoms worsen before appointment will go to [facility].  Reason for Disposition . Systolic BP  >= 681 OR Diastolic >= 594  Answer Assessment - Initial Assessment Questions 1. BLOOD PRESSURE: "What is the blood pressure?" "Did you take at least two measurements 5 minutes apart?"     180/90 2. ONSET: "When did you take your blood pressure?"     15 minutes ago 3. HOW: "How did you obtain the blood pressure?" (e.g., visiting nurse, automatic home BP monitor)     Home BPmonitor 4. HISTORY: "Do you have a history of high blood pressure?"     Yes 5. MEDICATIONS: "Are you taking any medications for blood pressure?" "Have you missed any doses recently?"     Did not take medications 6. OTHER SYMPTOMS: "Do you have any symptoms?" (e.g., headache, chest pain, blurred vision, difficulty breathing, weakness)     Nausea and diarrhea 7. PREGNANCY: "Is there any chance you are pregnant?" "When was your last menstrual period?"     No  Protocols used: HIGH BLOOD PRESSURE-A-AH

## 2017-07-07 ENCOUNTER — Ambulatory Visit: Payer: Medicare Other | Admitting: Internal Medicine

## 2017-07-11 ENCOUNTER — Ambulatory Visit: Payer: Medicare Other | Admitting: Internal Medicine

## 2017-07-16 DIAGNOSIS — C44319 Basal cell carcinoma of skin of other parts of face: Secondary | ICD-10-CM | POA: Diagnosis not present

## 2017-07-20 ENCOUNTER — Other Ambulatory Visit: Payer: Self-pay | Admitting: Internal Medicine

## 2017-07-22 ENCOUNTER — Other Ambulatory Visit: Payer: Self-pay

## 2017-07-22 MED ORDER — MUPIROCIN 2 % EX OINT: 1.0000 "application " | TOPICAL_OINTMENT | Freq: Four times a day (QID) | CUTANEOUS | 0 refills | Status: DC

## 2017-08-18 ENCOUNTER — Other Ambulatory Visit: Payer: Self-pay

## 2017-08-18 MED ORDER — QUETIAPINE FUMARATE 25 MG PO TABS: 25.0000 mg | ORAL_TABLET | Freq: Two times a day (BID) | ORAL | 5 refills | Status: DC

## 2017-09-16 NOTE — Progress Notes (Signed)
Follow-up Visit   Date: 09/19/17    Sabrina Page MRN: [id number] DOB: April 18, 1934   Interim History: Sabrina Page is a 84 y.o.  female with ashtma, GERD, depression, hyperlipidemia, CKD, and peripheral vascular disease returning to the clinic for follow-up of orthostasis.  The patient was accompanied to the clinic by daughter and [place] Interpretor who also provides collateral information.    History of present illness: Starting in the spring of 2017, she began having lightheadedness especially worse in the morning which improves as the day goes on.  Every time she stands, she gets lightheaded and has had 6 falls over the past year, the last occurring in May. She does not take any antihypertensive medication or diuretics, although she has leg swelling.  She was taken to the ER on 01/18/2016 after her daughter, a home health nurse, checked her orthostatic blood pressure and her SBP was noted to be in the 60s and she was very symptomatic.  She started midodrine '10mg'$  BID in early September and has noticed 30% improvement.  She has been complaint with using a walker and feels that this helps.  Her daughter also complains that patient may have some memory loss and tends to be very suspicious about things.  She states that patient has always been very jealous.  There are no delusional thoughts or hallucinations.  Patient lives with her husband, who is also suffering from memory problems. She has a caregiver that comes for a few hours and her daughter who helps with many IADLs.  No bowel/bladder dysfunction.  She also complains of dry eyes, no dry mouth.   She endorses numbness of the feet which is worse at night time.   In 2017, she continued to have frequent falls and ractured her right hip in November 2017.  She was living with daughter for some time and now has returned back to an apartment with her husband.  She has a caregiver that comes 9am-12pm and 5-7pm. Daughter is  feeling overwhelmed because her parents do not want to live with her and she still has to manage their day-to-day activities. Daughter manages finances and is POA for both.     UPDATE 03/17/2017:  She is here for 6 month follow-up visit. She uses a wheelchair for long distances and walks with a walker at home.  Unfortunately, her lightheadedness can be severe in the morning and following meals, that she has become very sedentary.  Her right hip and knee also limit her ability to walk.  Physical therapy recommended assisting living, but patient wants to stay at home.  They have a caregiver who comes 2 hours in the morning and at night. She has suffered one fall out of bed while sleeping.  Mestinon '30mg'$  TID has helped.  UPDATE 09/16/2017:  She is here for 6 month appointment.  Soon after her last follow-up visit, she was hospitalized with worsening orthostasis.  Florinef 0.'1mg'$  daily was added and has seems to help.  She remains on midodine '10mg'$  which she takes three times daily, as long has her SBP < 160 and mestinon '60mg'$  TID. She has not suffered any interval falls and is much more cautious when walking. She is very sedentary and uses a Administrator, arts.    She is also taking Namzeric for dementia, which is table. Daughter reports that every morning, she is very tired and has noticed that her mother does not use her CPAP.     Medications:  Current Outpatient Medications on File Prior  to Visit  Medication Sig Dispense Refill  . aspirin 81 MG EC tablet Take 1 tablet (81 mg total) by mouth 2 (two) times daily. 60 tablet 0  . B Complex-C (SUPER B COMPLEX PO) Take 1 tablet by mouth every morning.     . Cholecalciferol 1000 UNITS capsule Take 1,000 Units by mouth every morning.     . clonazePAM (KLONOPIN) 0.5 MG tablet TAKE 1 TABLET BY MOUTH TWICE A DAY AS NEEDED FOR ANXIETY 60 tablet 2  . fludrocortisone (FLORINEF) 0.1 MG tablet Take 1 tablet (0.1 mg total) by mouth daily. 90 tablet 3  . FLUoxetine (PROZAC)  40 MG capsule Take 1 capsule (40 mg total) by mouth daily. 90 capsule 3  . loratadine (CLARITIN) 10 MG tablet TAKE 1 TABLET BY MOUTH EVERY MORNING 100 tablet 2  . Memantine HCl-Donepezil HCl (NAMZARIC) 7-10 MG CP24 Take 1 capsule by mouth daily. 30 capsule 5  . midodrine (PROAMATINE) 10 MG tablet Take 1 tablet (10 mg total) by mouth 3 (three) times daily. 270 tablet 3  . mupirocin ointment (BACTROBAN) 2 % Apply 1 application topically 4 (four) times daily. 22 g 0  . Omega-3 Fatty Acids (FISH OIL) 1000 MG CAPS Take 1 capsule by mouth every morning.     Marland Kitchen omeprazole (PRILOSEC) 20 MG capsule Take 2 capsules (40 mg total) by mouth daily. 180 capsule 3  . potassium chloride (KLOR-CON) 8 MEQ tablet Take 1 tablet (8 mEq total) by mouth every morning. 90 tablet 3  . pyridostigmine (MESTINON) 60 MG tablet Take 1 tablet (60 mg total) by mouth 3 (three) times daily. 270 tablet 3  . QUEtiapine (SEROQUEL) 25 MG tablet Take 1 tablet (25 mg total) by mouth 2 (two) times daily. 60 tablet 5  . traMADol (ULTRAM) 50 MG tablet Take 1 tablet (50 mg total) by mouth every 12 (twelve) hours as needed for severe pain. 100 tablet 2   No current facility-administered medications on file prior to visit.     Allergies:  Allergies  Allergen Reactions  . Atorvastatin     REACTION: upset stomach  . Enalapril Maleate   . Hctz [Hydrochlorothiazide]     Dizzy   . Lovastatin     REACTION: tongue stress  . Simvastatin     REACTION: mouth sores  . Topamax [Topiramate]     syncope    Review of Systems:  CONSTITUTIONAL: No fevers, chills, night sweats, or weight loss.  EYES: No visual changes or eye pain ENT: No hearing changes.  No history of nose bleeds.   RESPIRATORY: No cough, wheezing and shortness of breath.   CARDIOVASCULAR: Negative for chest pain, and palpitations.   GI: Negative for abdominal discomfort, blood in stools or black stools.  No recent change in bowel habits.   GU:  No history of incontinence.     MUSCLOSKELETAL: No history of joint pain or swelling.  No myalgias.   SKIN: Negative for lesions, rash, and itching.   ENDOCRINE: Negative for cold or heat intolerance, polydipsia or goiter.   PSYCH:  No depression +anxiety symptoms.   NEURO: As Above.   Vital Signs:  BP (!) 142/78   Pulse 67   Resp 12   Ht '5\' 4"'$  (1.626 m)   Wt 215 lb (97.5 kg)   SpO2 96%   BMI 36.90 kg/m   General Medical Exam:   General:  Well appearing, comfortable  Eyes/ENT: see cranial nerve examination.   Neck: No masses appreciated.  Full  range of motion without tenderness.  No carotid bruits. Respiratory:  Clear to auscultation, good air entry bilaterally.   Cardiac:  Regular rate and rhythm, no murmur.   Ext:  Mild edema, she is wearing compression stockings.   Neurological Exam: MENTAL STATUS including orientation to time, place, person, recent and remote memory, attention span and concentration, language, and fund of knowledge is fairly intact.  Speech has strong accent.  CRANIAL NERVES:  No visual field defects.  Pupils equal round and reactive to light.  Normal conjugate, extra-ocular eye movements in Page directions of gaze.  No ptosis.  Face is symmetric. Palate elevates symmetrically.  Tongue is midline.  MOTOR:  Motor strength is 5/5 in Page extremities, limited testing in RLE due to knee pain No tremor or pronator drift. Tone is normal.    COORDINATION/GAIT:  Normal finger-to- nose-finger.  Intact rapid alternating movements bilaterally.  Gait not tested, patient in wheelchair.   Data: US carotids 07/28/2015:  1-39% bilateral ICA stenosis  Labs 02/17/2016:  vitamin B12 556, vitamin B6 32.7,  SSA/B neg, copper 109, SPEP with IFE no M protein, ESR 15  MRI brain wo contrast 03/02/2016:  No acute infarct or intracranial hemorrhage.  Mild chronic microvascular changes. Moderate global atrophy. Ventricular prominence probably secondary to central atrophy rather than hydrocephalus. No intracranial  mass lesion noted on this unenhanced exam. Mild cervical spondylotic changes C4-5 incompletely assessed.  IMPRESSION/PLAN: 1.  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  No manifestation of neurodegenerative condition such as multiple system atrophy, but will continue to monitor for this.  She has appreciated improvement of lightheadedness since adding florinef   - Continue florinef 0.'1mg'$  daily, this can be increased to BID dosing if symptoms get worse              - Continue midodrine to '10mg'$  TID, do not take if SBP > 160   - Continue mestinon '60mg'$  TID              - Encouraged to use rollator at Page times when walking              - Fall precaution discussed, makes positional changes slowly to minimize lightheadedness              - Continue to use knee high compression stockings, which she is complaint with  2.  Dementia without behavior changes, stable.  MRI brain shows global atrophy.    - Daughter is POA and manages medications and finances.  - Continue Namzeric  Return to clinic in 6 months  Greater than 50% of this 25 minute visit was spent in counseling, explanation of diagnosis, planning of further management, and coordination of care.   Thank you for allowing me to participate in patient's care.  If I can answer any additional questions, I would be pleased to do so.    Sincerely,    Donika K. Posey Pronto, DO

## 2017-09-19 ENCOUNTER — Ambulatory Visit (INDEPENDENT_AMBULATORY_CARE_PROVIDER_SITE_OTHER): Payer: Medicare Other | Admitting: Neurology

## 2017-09-19 ENCOUNTER — Encounter: Payer: Self-pay | Admitting: Neurology

## 2017-09-19 ENCOUNTER — Other Ambulatory Visit: Payer: Self-pay

## 2017-09-19 VITALS — BP 142/78 | HR 67 | Resp 12 | Ht 64.0 in | Wt 215.0 lb

## 2017-09-19 DIAGNOSIS — F0280 Dementia in other diseases classified elsewhere without behavioral disturbance: Secondary | ICD-10-CM | POA: Diagnosis not present

## 2017-09-19 DIAGNOSIS — G309 Alzheimer's disease, unspecified: Secondary | ICD-10-CM | POA: Diagnosis not present

## 2017-09-19 DIAGNOSIS — I951 Orthostatic hypotension: Secondary | ICD-10-CM | POA: Diagnosis not present

## 2017-09-19 NOTE — Patient Instructions (Signed)
Continue your medications as you are taking them  Return to clinic in 6 months

## 2017-09-20 ENCOUNTER — Encounter: Payer: Self-pay | Admitting: Internal Medicine

## 2017-09-20 ENCOUNTER — Other Ambulatory Visit (INDEPENDENT_AMBULATORY_CARE_PROVIDER_SITE_OTHER): Payer: Medicare Other

## 2017-09-20 ENCOUNTER — Ambulatory Visit (INDEPENDENT_AMBULATORY_CARE_PROVIDER_SITE_OTHER): Payer: Medicare Other | Admitting: Internal Medicine

## 2017-09-20 DIAGNOSIS — I951 Orthostatic hypotension: Secondary | ICD-10-CM | POA: Diagnosis not present

## 2017-09-20 DIAGNOSIS — G903 Multi-system degeneration of the autonomic nervous system: Secondary | ICD-10-CM

## 2017-09-20 DIAGNOSIS — I10 Essential (primary) hypertension: Secondary | ICD-10-CM

## 2017-09-20 DIAGNOSIS — J4540 Moderate persistent asthma, uncomplicated: Secondary | ICD-10-CM

## 2017-09-20 DIAGNOSIS — G309 Alzheimer's disease, unspecified: Secondary | ICD-10-CM | POA: Diagnosis not present

## 2017-09-20 DIAGNOSIS — F0280 Dementia in other diseases classified elsewhere without behavioral disturbance: Secondary | ICD-10-CM | POA: Diagnosis not present

## 2017-09-20 LAB — BASIC METABOLIC PANEL
BUN: 16 mg/dL (ref 6–23)
CO2: 29 mEq/L (ref 19–32)
Calcium: 8.9 mg/dL (ref 8.4–10.5)
Chloride: 103 mEq/L (ref 96–112)
Creatinine, Ser: 0.83 mg/dL (ref 0.40–1.20)
GFR: 69.96 mL/min (ref >60.00)
Glucose, Bld: 139 mg/dL — ABNORMAL HIGH (ref 70–99)
Potassium: 4.1 mEq/L (ref 3.5–5.1)
Sodium: 139 mEq/L (ref 135–145)

## 2017-09-20 LAB — CBC WITH DIFFERENTIAL/PLATELET
Basophils Absolute: 0 10*3/uL (ref 0.0–0.1)
Basophils Relative: 0.9 % (ref 0.0–3.0)
Eosinophils Absolute: 0.1 10*3/uL (ref 0.0–0.7)
Eosinophils Relative: 2.7 % (ref 0.0–5.0)
HCT: 40.6 % (ref 36.0–46.0)
Hemoglobin: 13.4 g/dL (ref 12.0–15.0)
Lymphocytes Relative: 25.4 % (ref 12.0–46.0)
Lymphs Abs: 1.3 10*3/uL (ref 0.7–4.0)
MCHC: 32.9 g/dL (ref 30.0–36.0)
MCV: 88.7 fl (ref 78.0–100.0)
Monocytes Absolute: 0.4 10*3/uL (ref 0.1–1.0)
Monocytes Relative: 8.2 % (ref 3.0–12.0)
Neutro Abs: 3.2 10*3/uL (ref 1.4–7.7)
Neutrophils Relative %: 62.8 % (ref 43.0–77.0)
Platelets: 176 10*3/uL (ref 150.0–400.0)
RBC: 4.58 Mil/uL (ref 3.87–5.11)
RDW: 13.7 % (ref 11.5–15.5)
WBC: 5.1 10*3/uL (ref 4.0–10.5)

## 2017-09-20 LAB — TSH: TSH: 1.33 u[IU]/mL (ref 0.35–4.50)

## 2017-09-20 MED ORDER — MEMANTINE HCL-DONEPEZIL HCL 7-10 MG PO CP24
1.0000 | ORAL_CAPSULE | Freq: Every day | ORAL | 5 refills | Status: DC
Start: 2017-09-20 — End: 2017-12-21

## 2017-09-20 MED ORDER — TRAMADOL HCL 50 MG PO TABS: 50.0000 mg | ORAL_TABLET | Freq: Two times a day (BID) | ORAL | 2 refills | Status: DC | PRN

## 2017-09-20 NOTE — Assessment & Plan Note (Signed)
Midodrine  tid

## 2017-09-20 NOTE — Progress Notes (Signed)
Subjective:  Patient ID: Sabrina Page, female    DOB: 12-05-1933  Age: 84 y.o. MRN: [id number]  CC: No chief complaint on file.   HPI Yulitza Larivee presents for skin ca, depression, LBP, memory loss f/u Not taking Namenda   Outpatient Medications Prior to Visit  Medication Sig Dispense Refill  . aspirin 81 MG EC tablet Take 1 tablet (81 mg total) by mouth 2 (two) times daily. 60 tablet 0  . B Complex-C (SUPER B COMPLEX PO) Take 1 tablet by mouth every morning.     . Cholecalciferol 1000 UNITS capsule Take 1,000 Units by mouth every morning.     . fludrocortisone (FLORINEF) 0.1 MG tablet Take 1 tablet (0.1 mg total) by mouth daily. 90 tablet 3  . FLUoxetine (PROZAC) 40 MG capsule Take 1 capsule (40 mg total) by mouth daily. 90 capsule 3  . loratadine (CLARITIN) 10 MG tablet TAKE 1 TABLET BY MOUTH EVERY MORNING 100 tablet 2  . midodrine (PROAMATINE) 10 MG tablet Take 1 tablet (10 mg total) by mouth 3 (three) times daily. 270 tablet 3  . mupirocin ointment (BACTROBAN) 2 % Apply 1 application topically 4 (four) times daily. 22 g 0  . Omega-3 Fatty Acids (FISH OIL) 1000 MG CAPS Take 1 capsule by mouth every morning.     Marland Kitchen omeprazole (PRILOSEC) 20 MG capsule Take 2 capsules (40 mg total) by mouth daily. 180 capsule 3  . potassium chloride (KLOR-CON) 8 MEQ tablet Take 1 tablet (8 mEq total) by mouth every morning. 90 tablet 3  . pyridostigmine (MESTINON) 60 MG tablet Take 1 tablet (60 mg total) by mouth 3 (three) times daily. 270 tablet 3  . traMADol (ULTRAM) 50 MG tablet Take 1 tablet (50 mg total) by mouth every 12 (twelve) hours as needed for severe pain. 100 tablet 2  . clonazePAM (KLONOPIN) 0.5 MG tablet TAKE 1 TABLET BY MOUTH TWICE A DAY AS NEEDED FOR ANXIETY 60 tablet 2  . Memantine HCl-Donepezil HCl (NAMZARIC) 7-10 MG CP24 Take 1 capsule by mouth daily. 30 capsule 5  . QUEtiapine (SEROQUEL) 25 MG tablet Take 1 tablet (25 mg total) by mouth 2 (two) times daily. 60  tablet 5   No facility-administered medications prior to visit.     ROS Review of Systems  Constitutional: Negative for activity change, appetite change, chills, fatigue and unexpected weight change.  HENT: Negative for congestion, mouth sores and sinus pressure.   Eyes: Negative for visual disturbance.  Respiratory: Negative for cough and chest tightness.   Gastrointestinal: Negative for abdominal pain and nausea.  Genitourinary: Negative for difficulty urinating, frequency and vaginal pain.  Musculoskeletal: Negative for back pain and gait problem.  Skin: Negative for pallor and rash.  Neurological: Negative for dizziness, tremors, weakness, numbness and headaches.  Psychiatric/Behavioral: Positive for decreased concentration. Negative for confusion, sleep disturbance and suicidal ideas. The patient is nervous/anxious.     Objective:  BP 124/68 (BP Location: Right Arm, Patient Position: Sitting, Cuff Size: Large)   Pulse 70   Temp 98 F (36.7 C) (Oral)   Ht 5\' 4"  (1.626 m)   Wt 229 lb (103.9 kg)   SpO2 95%   BMI 39.31 kg/m   BP Readings from Last 3 Encounters:  09/20/17 124/68  09/19/17 (!) 142/78  06/23/17 118/64    Wt Readings from Last 3 Encounters:  09/20/17 229 lb (103.9 kg)  09/19/17 215 lb (97.5 kg)  06/23/17 221 lb (100.2 kg)    Physical Exam  Constitutional: She appears well-developed. No distress.  HENT:  Head: Normocephalic.  Right Ear: External ear normal.  Left Ear: External ear normal.  Nose: Nose normal.  Mouth/Throat: Oropharynx is clear and moist.  Eyes: Pupils are equal, round, and reactive to light. Conjunctivae are normal. Right eye exhibits no discharge. Left eye exhibits no discharge.  Neck: Normal range of motion. Neck supple. No JVD present. No tracheal deviation present. No thyromegaly present.  Cardiovascular: Normal rate, regular rhythm and normal heart sounds.  Pulmonary/Chest: No stridor. No respiratory distress. She has no wheezes.    Abdominal: Soft. Bowel sounds are normal. She exhibits no distension and no mass. There is no tenderness. There is no rebound and no guarding.  Musculoskeletal: She exhibits no edema or tenderness.  Lymphadenopathy:    She has no cervical adenopathy.  Neurological: She displays normal reflexes. No cranial nerve deficit. She exhibits normal muscle tone. Coordination normal.  Skin: No rash noted. No erythema.  Psychiatric: She has a normal mood and affect. Her behavior is normal. Thought content normal.   In a w/c Forgetful obese  Lab Results  Component Value Date   WBC 5.4 03/22/2017   HGB 13.4 03/22/2017   HCT 42.5 03/22/2017   PLT 169 03/22/2017   GLUCOSE 105 (H) 06/10/2017   CHOL 270 (H) 06/12/2014   TRIG 132.0 06/12/2014   HDL 52.40 06/12/2014   LDLDIRECT 159.1 05/16/2012   LDLCALC 191 (H) 06/12/2014   ALT 16 03/19/2017   AST 23 03/19/2017   NA 138 06/10/2017   K 4.3 06/10/2017   CL 102 06/10/2017   CREATININE 1.02 06/10/2017   BUN 16 06/10/2017   CO2 30 06/10/2017   TSH 1.71 09/23/2016   INR 1.07 04/16/2016   HGBA1C 5.6 12/25/2015    Dg Lumbar Spine Complete  Result Date: 03/19/2017 CLINICAL DATA:  Weakness with back pain. Multiple falls over the past 2 days. Initial encounter. 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 Negative for acute fracture or traumatic malalignment. Lower lumbar facet arthropathy most notable at L4-5 where there is grade 1 anterolisthesis. Generalized spondylosis and degenerative disc narrowing, greatest at L4-5. Osteopenia. Atherosclerosis. IMPRESSION: 1. No acute finding. 2. Lumbar spine degeneration with L4-5 anterolisthesis. Electronically Signed   By: Monte Fantasia M.D.   On: 03/19/2017 13:40   Ct Head Wo Contrast  Result Date: 03/19/2017 CLINICAL DATA:  Fall last night. High clinical risk. Initial encounter. EXAM: CT  HEAD WITHOUT CONTRAST CT CERVICAL SPINE WITHOUT CONTRAST TECHNIQUE: Multidetector CT imaging of the head and cervical spine was performed following the standard protocol without intravenous contrast. Multiplanar CT image reconstructions of the cervical spine were also generated. COMPARISON:  Brain MRI 03/02/2016. Head and cervical spine CT 05/10/2010 FINDINGS: CT HEAD FINDINGS Brain: No evidence of acute infarction, hemorrhage, hydrocephalus, extra-axial collection or mass lesion/mass effect. Atrophy with ventriculomegaly. Mild for age chronic small vessel ischemia. Vascular: Atherosclerotic calcification.  No hyperdense vessel. Skull: Negative for fracture. Sinuses/Orbits: No evidence of injury CT CERVICAL SPINE FINDINGS Alignment: No traumatic malalignment. Degenerative reversal of cervical lordosis that is progressed from 2011. Mild C7-T1 anterolisthesis. Skull base and vertebrae: Negative for fracture. Soft tissues and spinal canal: No prevertebral fluid or swelling. No visible canal hematoma. Bilateral thyroid nodules measuring up to 1 cm, incidental based on size and demographics. Disc levels:  Diffuse disc and facet degeneration. Upper chest: No acute finding. IMPRESSION:  No evidence of acute intracranial or cervical spine injury. Electronically Signed   By: Monte Fantasia M.D.   On: 03/19/2017 14:06   Ct Cervical Spine Wo Contrast  Result Date: 03/19/2017 CLINICAL DATA:  Fall last night. High clinical risk. Initial encounter. EXAM: CT HEAD WITHOUT CONTRAST CT CERVICAL SPINE WITHOUT CONTRAST TECHNIQUE: Multidetector CT imaging of the head and cervical spine was performed following the standard protocol without intravenous contrast. Multiplanar CT image reconstructions of the cervical spine were also generated. COMPARISON:  Brain MRI 03/02/2016. Head and cervical spine CT 05/10/2010 FINDINGS: CT HEAD FINDINGS Brain: No evidence of acute infarction, hemorrhage, hydrocephalus, extra-axial collection or  mass lesion/mass effect. Atrophy with ventriculomegaly. Mild for age chronic small vessel ischemia. Vascular: Atherosclerotic calcification.  No hyperdense vessel. Skull: Negative for fracture. Sinuses/Orbits: No evidence of injury CT CERVICAL SPINE FINDINGS Alignment: No traumatic malalignment. Degenerative reversal of cervical lordosis that is progressed from 2011. Mild C7-T1 anterolisthesis. Skull base and vertebrae: Negative for fracture. Soft tissues and spinal canal: No prevertebral fluid or swelling. No visible canal hematoma. Bilateral thyroid nodules measuring up to 1 cm, incidental based on size and demographics. Disc levels:  Diffuse disc and facet degeneration. Upper chest: No acute finding. IMPRESSION: No evidence of acute intracranial or cervical spine injury. Electronically Signed   By: Monte Fantasia M.D.   On: 03/19/2017 14:06    Assessment & Plan:   There are no diagnoses linked to this encounter. I have discontinued Janijah Haymer's Memantine HCl-Donepezil HCl, clonazePAM, and QUEtiapine. I am also having her maintain her Fish Oil, Cholecalciferol, B Complex-C (SUPER B COMPLEX PO), aspirin, fludrocortisone, FLUoxetine, potassium chloride, midodrine, omeprazole, pyridostigmine, traMADol, loratadine, and mupirocin ointment.  No orders of the defined types were placed in this encounter.    Follow-up: No follow-ups on file.  [name], MD

## 2017-09-20 NOTE — Assessment & Plan Note (Signed)
Pr declined Rx Discussed Re-start

## 2017-09-20 NOTE — Assessment & Plan Note (Signed)
Midodrine, Florinef

## 2017-09-20 NOTE — Assessment & Plan Note (Addendum)
Not using Memory [facility]

## 2017-09-20 NOTE — Assessment & Plan Note (Signed)
Off Rx 

## 2017-09-21 ENCOUNTER — Telehealth: Payer: Self-pay | Admitting: Internal Medicine

## 2017-09-21 DIAGNOSIS — G4733 Obstructive sleep apnea (adult) (pediatric): Secondary | ICD-10-CM

## 2017-09-21 NOTE — Telephone Encounter (Signed)
Per 12/16/15 ov note, settings are auto 5-15cm.  CY please advise if okay to keep above settings, as it does not appear that our office has ordered cpap previously.

## 2017-09-21 NOTE — Telephone Encounter (Signed)
Order placed.  Pt's daughter aware.  Nothing further needed.

## 2017-09-21 NOTE — Telephone Encounter (Signed)
Yes, ok to change DME at patient request.

## 2017-09-21 NOTE — Telephone Encounter (Signed)
Yes- keep current settings. Replace mask of choice humidifier, supplies, AirView

## 2017-09-21 NOTE — Telephone Encounter (Signed)
Spoke with Galini she would like to switch from [place] to [employer]. CY can you sign order for CPAP to change DME?  Current Outpatient Medications on File Prior to Visit  Medication Sig Dispense Refill  . aspirin 81 MG EC tablet Take 1 tablet (81 mg total) by mouth 2 (two) times daily. 60 tablet 0  . B Complex-C (SUPER B COMPLEX PO) Take 1 tablet by mouth every morning.     . Cholecalciferol 1000 UNITS capsule Take 1,000 Units by mouth every morning.     . fludrocortisone (FLORINEF) 0.1 MG tablet Take 1 tablet (0.1 mg total) by mouth daily. 90 tablet 3  . FLUoxetine (PROZAC) 40 MG capsule Take 1 capsule (40 mg total) by mouth daily. 90 capsule 3  . loratadine (CLARITIN) 10 MG tablet TAKE 1 TABLET BY MOUTH EVERY MORNING 100 tablet 2  . Memantine HCl-Donepezil HCl (NAMZARIC) 7-10 MG CP24 Take 1 capsule by mouth daily. 30 capsule 5  . midodrine (PROAMATINE) 10 MG tablet Take 1 tablet (10 mg total) by mouth 3 (three) times daily. 270 tablet 3  . mupirocin ointment (BACTROBAN) 2 % Apply 1 application topically 4 (four) times daily. 22 g 0  . Omega-3 Fatty Acids (FISH OIL) 1000 MG CAPS Take 1 capsule by mouth every morning.     Marland Kitchen omeprazole (PRILOSEC) 20 MG capsule Take 2 capsules (40 mg total) by mouth daily. 180 capsule 3  . potassium chloride (KLOR-CON) 8 MEQ tablet Take 1 tablet (8 mEq total) by mouth every morning. 90 tablet 3  . pyridostigmine (MESTINON) 60 MG tablet Take 1 tablet (60 mg total) by mouth 3 (three) times daily. 270 tablet 3  . traMADol (ULTRAM) 50 MG tablet Take 1 tablet (50 mg total) by mouth every 12 (twelve) hours as needed for severe pain. 100 tablet 2   No current facility-administered medications on file prior to visit.    Allergies  Allergen Reactions  . Atorvastatin     REACTION: upset stomach  . Enalapril Maleate   . Hctz [Hydrochlorothiazide]     Dizzy   . Lovastatin     REACTION: tongue stress  . Simvastatin     REACTION: mouth sores  . Topamax [Topiramate]      syncope

## 2017-12-09 ENCOUNTER — Encounter: Payer: Self-pay | Admitting: Internal Medicine

## 2017-12-09 ENCOUNTER — Ambulatory Visit (INDEPENDENT_AMBULATORY_CARE_PROVIDER_SITE_OTHER): Payer: Medicare Other | Admitting: Internal Medicine

## 2017-12-09 VITALS — BP 126/78 | HR 71 | Ht 64.0 in | Wt 230.5 lb

## 2017-12-09 DIAGNOSIS — J4540 Moderate persistent asthma, uncomplicated: Secondary | ICD-10-CM

## 2017-12-09 DIAGNOSIS — G4733 Obstructive sleep apnea (adult) (pediatric): Secondary | ICD-10-CM | POA: Diagnosis not present

## 2017-12-09 DIAGNOSIS — Z9989 Dependence on other enabling machines and devices: Secondary | ICD-10-CM

## 2017-12-09 MED ORDER — BUDESONIDE-FORMOTEROL FUMARATE 160-4.5 MCG/ACT IN AERO: INHALATION_SPRAY | RESPIRATORY_TRACT | 12 refills | Status: DC

## 2017-12-09 MED ORDER — BUDESONIDE-FORMOTEROL FUMARATE 160-4.5 MCG/ACT IN AERO: 2.0000 | INHALATION_SPRAY | Freq: Two times a day (BID) | RESPIRATORY_TRACT | 0 refills | Status: DC

## 2017-12-09 NOTE — Progress Notes (Signed)
Subjective:    Patient ID: Sabrina Page, female    DOB: 10/16/1933, 84 y.o.   MRN: [id number]  HPI female former smoker from [place],  followed for OSA, chronic insomnia, complicated by asthma, allergic rhinitis, HBP, depression (husband has Alzheimer's) Unattended Home Sleep Test 07/30/13  AHI 19/ hr, desat to 78%,  Weight 226 lbs Office Spirometry 02/13/2015-WNL  ----------------------------------------------------------------------------------- 12/09/2017- 84 year old female former smoker followed for OSA, chronic insomnia, complicated by asthma, allergic rhinitis, HBP, depression CPAP auto 5-15/[facility]    daughter here as Optometrist -------She is having a hard time with just the nasal mask and daughter says that she did much better with full face mask. She has stopped using it. Breathing has not been good she has been wheezing and SOB. PCP gave [facility] which helped but too expensive.  Today breathing very well.  Intervals of dyspnea, chest tightness and wheeze may relate to weather changes but unclear.  We discussed alternatives to [facility] that might be cheaper. She has not been using CPAP reliably saying she does not like her mask and would like to go back to a full facemask.  Daughter says mother is somewhat confused in the mornings if she misses her CPAP.  Take it off in her sleep.  They deny parasomnias.   ROS-see HPI   + = positive Constitutional:    weight loss, night sweats, fevers, chills, fatigue, lassitude. HEENT:    headaches, difficulty swallowing, tooth/dental problems, sore throat,       sneezing, itching, ear ache, nasal congestion, post nasal drip, snoring CV:    chest pain, orthopnea, PND, swelling in lower extremities, anasarca,                                  dizziness, palpitations Resp:   +shortness of breath with exertion or at rest.                productive cough,   non-productive cough, coughing up of blood.              change in color of mucus.   +wheezing.   Skin:    rash or lesions. GI:  No-   heartburn, indigestion, abdominal pain, nausea, vomiting, diarrhea,                 change in bowel habits, loss of appetite GU: dysuria, change in color of urine, no urgency or frequency.   flank pain. MS:   joint pain, stiffness, decreased range of motion, back pain. Neuro-     nothing unusual Psych:  change in mood or affect.  depression or anxiety.   memory loss.      Objective:  OBJ- Physical Exam + limited English/daughter translated General- Alert, Oriented, Affect-appropriate, Distress- none acute, + obese, + wheelchair Skin- rash-none, lesions- none, excoriation- none Lymphadenopathy- none Head- atraumatic            Eyes- Gross vision intact, PERRLA, conjunctivae and secretions clear            Ears- Hearing, canals-normal            Nose- Clear, no-Septal dev, mucus, polyps, erosion, perforation             Throat- Mallampati III , mucosa clear , drainage- none, tonsils- atrophic, + full dentures Neck- flexible , trachea midline, no stridor , thyroid nl, carotid no bruit Chest - symmetrical excursion , unlabored  Heart/CV- RRR , no murmur , no gallop  , no rub, nl s1 s2                           - JVD- none , edema- none, stasis changes- none, varices- none           Lung- very clear to P&A, wheeze- none, cough- none , dullness-none, rub- none           Chest wall-  Abd-  Br/ Gen/ Rectal- Not done, not indicated Extrem- cyanosis- none, clubbing, none, atrophy- none, strength- nl Neuro- grossly intact to observation  Assessment & Plan:

## 2017-12-09 NOTE — Patient Instructions (Addendum)
Order- schedule overnight oximetry on room air, off CPAP     Dx asthma mild intermittent  Order- schedule mask fitting at sleep center  Sample and printed script to try Symbicort 160    Inhale 2 puffs then rinse mouth, twice daily    Try this instead of Dulera to see if it works as well and is cheaper  Please call if we can help

## 2017-12-10 NOTE — Assessment & Plan Note (Signed)
We discussed maintenance and rescue inhaler concepts.  We are going to let her try Symbicort because we have sample available and it may be cheaper than Dulera.

## 2017-12-10 NOTE — Assessment & Plan Note (Signed)
She benefits from CPAP when she uses it, with decreased morning confusion according to her daughter.  Patient shares home with husband who has Alzheimer's.  Daughter who understands the medical issues does not live in the home. Plan-continue educational efforts, continue auto 5-15.  Schedule mask fitting

## 2017-12-16 DIAGNOSIS — J449 Chronic obstructive pulmonary disease, unspecified: Secondary | ICD-10-CM | POA: Diagnosis not present

## 2017-12-16 DIAGNOSIS — R0902 Hypoxemia: Secondary | ICD-10-CM | POA: Diagnosis not present

## 2017-12-21 ENCOUNTER — Other Ambulatory Visit (INDEPENDENT_AMBULATORY_CARE_PROVIDER_SITE_OTHER): Payer: Medicare Other

## 2017-12-21 ENCOUNTER — Encounter: Payer: Self-pay | Admitting: Internal Medicine

## 2017-12-21 ENCOUNTER — Ambulatory Visit (INDEPENDENT_AMBULATORY_CARE_PROVIDER_SITE_OTHER): Payer: Medicare Other | Admitting: Internal Medicine

## 2017-12-21 DIAGNOSIS — M545 Low back pain: Secondary | ICD-10-CM | POA: Diagnosis not present

## 2017-12-21 DIAGNOSIS — R5382 Chronic fatigue, unspecified: Secondary | ICD-10-CM | POA: Diagnosis not present

## 2017-12-21 DIAGNOSIS — M79604 Pain in right leg: Secondary | ICD-10-CM

## 2017-12-21 DIAGNOSIS — I951 Orthostatic hypotension: Secondary | ICD-10-CM

## 2017-12-21 DIAGNOSIS — I10 Essential (primary) hypertension: Secondary | ICD-10-CM

## 2017-12-21 DIAGNOSIS — F0280 Dementia in other diseases classified elsewhere without behavioral disturbance: Secondary | ICD-10-CM

## 2017-12-21 DIAGNOSIS — M79605 Pain in left leg: Secondary | ICD-10-CM

## 2017-12-21 DIAGNOSIS — G309 Alzheimer's disease, unspecified: Secondary | ICD-10-CM

## 2017-12-21 LAB — CBC WITH DIFFERENTIAL/PLATELET
Basophils Absolute: 0.1 10*3/uL (ref 0.0–0.1)
Basophils Relative: 1.2 % (ref 0.0–3.0)
Eosinophils Absolute: 0.2 10*3/uL (ref 0.0–0.7)
Eosinophils Relative: 3.4 % (ref 0.0–5.0)
HCT: 42.6 % (ref 36.0–46.0)
Hemoglobin: 13.9 g/dL (ref 12.0–15.0)
Lymphocytes Relative: 26.3 % (ref 12.0–46.0)
Lymphs Abs: 1.3 10*3/uL (ref 0.7–4.0)
MCHC: 32.7 g/dL (ref 30.0–36.0)
MCV: 87.7 fl (ref 78.0–100.0)
Monocytes Absolute: 0.5 10*3/uL (ref 0.1–1.0)
Monocytes Relative: 9.1 % (ref 3.0–12.0)
Neutro Abs: 3.1 10*3/uL (ref 1.4–7.7)
Neutrophils Relative %: 60 % (ref 43.0–77.0)
Platelets: 183 10*3/uL (ref 150.0–400.0)
RBC: 4.86 Mil/uL (ref 3.87–5.11)
RDW: 13.5 % (ref 11.5–15.5)
WBC: 5.1 10*3/uL (ref 4.0–10.5)

## 2017-12-21 LAB — BASIC METABOLIC PANEL
BUN: 17 mg/dL (ref 6–23)
CO2: 34 mEq/L — ABNORMAL HIGH (ref 19–32)
Calcium: 9.2 mg/dL (ref 8.4–10.5)
Chloride: 103 mEq/L (ref 96–112)
Creatinine, Ser: 1.01 mg/dL (ref 0.40–1.20)
GFR: 55.74 mL/min — ABNORMAL LOW (ref >60.00)
Glucose, Bld: 93 mg/dL (ref 70–99)
Potassium: 4 mEq/L (ref 3.5–5.1)
Sodium: 140 mEq/L (ref 135–145)

## 2017-12-21 LAB — HEPATIC FUNCTION PANEL
ALT: 8 U/L (ref 0–35)
AST: 12 U/L (ref 0–37)
Albumin: 3.8 g/dL (ref 3.5–5.2)
Alkaline Phosphatase: 63 U/L (ref 39–117)
Bilirubin, Direct: 0.1 mg/dL (ref 0.0–0.3)
Total Bilirubin: 0.6 mg/dL (ref 0.2–1.2)
Total Protein: 6.8 g/dL (ref 6.0–8.3)

## 2017-12-21 LAB — TSH: TSH: 1.75 u[IU]/mL (ref 0.35–4.50)

## 2017-12-21 MED ORDER — ARIPIPRAZOLE 2 MG PO TABS: 2.0000 mg | ORAL_TABLET | Freq: Every day | ORAL | 5 refills | Status: DC

## 2017-12-21 NOTE — Assessment & Plan Note (Signed)
Chronic. 

## 2017-12-21 NOTE — Progress Notes (Signed)
Subjective:  Patient ID: Sabrina Page, female    DOB: August 20, 1933  Age: 84 y.o. MRN: [id number]  CC: No chief complaint on file.   HPI Sabrina Page presents for depression, LBP, spinal stenosis f/u  Outpatient Medications Prior to Visit  Medication Sig Dispense Refill  . aspirin 81 MG EC tablet Take 1 tablet (81 mg total) by mouth 2 (two) times daily. 60 tablet 0  . B Complex-C (SUPER B COMPLEX PO) Take 1 tablet by mouth every morning.     . Cholecalciferol 1000 UNITS capsule Take 1,000 Units by mouth every morning.     . fludrocortisone (FLORINEF) 0.1 MG tablet Take 1 tablet (0.1 mg total) by mouth daily. 90 tablet 3  . FLUoxetine (PROZAC) 40 MG capsule Take 1 capsule (40 mg total) by mouth daily. 90 capsule 3  . loratadine (CLARITIN) 10 MG tablet TAKE 1 TABLET BY MOUTH EVERY MORNING 100 tablet 2  . midodrine (PROAMATINE) 10 MG tablet Take 1 tablet (10 mg total) by mouth 3 (three) times daily. 270 tablet 3  . mometasone-formoterol (DULERA) 200-5 MCG/ACT AERO Inhale into the lungs.    . Omega-3 Fatty Acids (FISH OIL) 1000 MG CAPS Take 1 capsule by mouth every morning.     Marland Kitchen omeprazole (PRILOSEC) 20 MG capsule Take 2 capsules (40 mg total) by mouth daily. 180 capsule 3  . potassium chloride (KLOR-CON) 8 MEQ tablet Take 1 tablet (8 mEq total) by mouth every morning. 90 tablet 3  . pyridostigmine (MESTINON) 60 MG tablet Take 1 tablet (60 mg total) by mouth 3 (three) times daily. 270 tablet 3  . traMADol (ULTRAM) 50 MG tablet Take 1 tablet (50 mg total) by mouth every 12 (twelve) hours as needed for severe pain. 100 tablet 2  . budesonide-formoterol (SYMBICORT) 160-4.5 MCG/ACT inhaler Inhale 2 puffs, then rinse mouth, twice daily 1 Inhaler 12  . budesonide-formoterol (SYMBICORT) 160-4.5 MCG/ACT inhaler Inhale 2 puffs into the lungs 2 (two) times daily for 1 day. 1 Inhaler 0  . Memantine HCl-Donepezil HCl (NAMZARIC) 7-10 MG CP24 Take 1 capsule by mouth daily. (Patient not  taking: Reported on 12/21/2017) 30 capsule 5   No facility-administered medications prior to visit.     ROS: Review of Systems  Constitutional: Positive for fatigue. Negative for activity change, appetite change, chills and unexpected weight change.  HENT: Negative for congestion, mouth sores and sinus pressure.   Eyes: Negative for visual disturbance.  Respiratory: Negative for cough and chest tightness.   Gastrointestinal: Negative for abdominal pain and nausea.  Genitourinary: Negative for difficulty urinating, frequency and vaginal pain.  Musculoskeletal: Positive for arthralgias, back pain and gait problem.  Skin: Negative for pallor and rash.  Neurological: Negative for dizziness, tremors, weakness, numbness and headaches.  Psychiatric/Behavioral: Positive for behavioral problems, confusion, decreased concentration and dysphoric mood. Negative for agitation, sleep disturbance and suicidal ideas. The patient is nervous/anxious.     Objective:  BP 128/62 (BP Location: Right Arm, Patient Position: Sitting, Cuff Size: Large)   Pulse 65   Temp 98.3 F (36.8 C) (Oral)   Ht 5\' 4"  (1.626 m)   Wt 231 lb (104.8 kg)   SpO2 95%   BMI 39.65 kg/m   BP Readings from Last 3 Encounters:  12/21/17 128/62  12/09/17 126/78  09/20/17 124/68    Wt Readings from Last 3 Encounters:  12/21/17 231 lb (104.8 kg)  12/09/17 230 lb 8 oz (104.6 kg)  09/20/17 229 lb (103.9 kg)    Physical  Exam  Constitutional: She appears well-developed. No distress.  HENT:  Head: Normocephalic.  Right Ear: External ear normal.  Left Ear: External ear normal.  Nose: Nose normal.  Mouth/Throat: Oropharynx is clear and moist.  Eyes: Pupils are equal, round, and reactive to light. Conjunctivae are normal. Right eye exhibits no discharge. Left eye exhibits no discharge.  Neck: Normal range of motion. Neck supple. No JVD present. No tracheal deviation present. No thyromegaly present.  Cardiovascular: Normal rate,  regular rhythm and normal heart sounds.  Pulmonary/Chest: No stridor. No respiratory distress. She has no wheezes.  Abdominal: Soft. Bowel sounds are normal. She exhibits no distension and no mass. There is no tenderness. There is no rebound and no guarding.  Musculoskeletal: She exhibits tenderness. She exhibits no edema.  Lymphadenopathy:    She has no cervical adenopathy.  Neurological: She displays normal reflexes. No cranial nerve deficit. She exhibits normal muscle tone. Coordination abnormal.  Skin: No rash noted. No erythema.  Psychiatric: She has a normal mood and affect. Her behavior is normal. Judgment and thought content normal.  in a w/c LS tender   FTF>40 min - paperwork, discussion of sx's, compliance  Lab Results  Component Value Date   WBC 5.1 09/20/2017   HGB 13.4 09/20/2017   HCT 40.6 09/20/2017   PLT 176.0 09/20/2017   GLUCOSE 139 (H) 09/20/2017   CHOL 270 (H) 06/12/2014   TRIG 132.0 06/12/2014   HDL 52.40 06/12/2014   LDLDIRECT 159.1 05/16/2012   LDLCALC 191 (H) 06/12/2014   ALT 16 03/19/2017   AST 23 03/19/2017   NA 139 09/20/2017   K 4.1 09/20/2017   CL 103 09/20/2017   CREATININE 0.83 09/20/2017   BUN 16 09/20/2017   CO2 29 09/20/2017   TSH 1.33 09/20/2017   INR 1.07 04/16/2016   HGBA1C 5.6 12/25/2015    Dg Lumbar Spine Complete  Result Date: 03/19/2017 CLINICAL DATA:  Weakness with back pain. Multiple falls over the past 2 days. Initial encounter. 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 Negative for acute fracture or traumatic malalignment. Lower lumbar facet arthropathy most notable at L4-5 where there is grade 1 anterolisthesis. Generalized spondylosis and degenerative disc narrowing, greatest at L4-5. Osteopenia. Atherosclerosis. IMPRESSION: 1. No acute finding. 2. Lumbar spine degeneration with L4-5 anterolisthesis.  Electronically Signed   By: Monte Fantasia M.D.   On: 03/19/2017 13:40   Ct Head Wo Contrast  Result Date: 03/19/2017 CLINICAL DATA:  Fall last night. High clinical risk. Initial encounter. EXAM: CT HEAD WITHOUT CONTRAST CT CERVICAL SPINE WITHOUT CONTRAST TECHNIQUE: Multidetector CT imaging of the head and cervical spine was performed following the standard protocol without intravenous contrast. Multiplanar CT image reconstructions of the cervical spine were also generated. COMPARISON:  Brain MRI 03/02/2016. Head and cervical spine CT 05/10/2010 FINDINGS: CT HEAD FINDINGS Brain: No evidence of acute infarction, hemorrhage, hydrocephalus, extra-axial collection or mass lesion/mass effect. Atrophy with ventriculomegaly. Mild for age chronic small vessel ischemia. Vascular: Atherosclerotic calcification.  No hyperdense vessel. Skull: Negative for fracture. Sinuses/Orbits: No evidence of injury CT CERVICAL SPINE FINDINGS Alignment: No traumatic malalignment. Degenerative reversal of cervical lordosis that is progressed from 2011. Mild C7-T1 anterolisthesis. Skull base and vertebrae: Negative for fracture. Soft tissues and spinal canal: No prevertebral fluid or swelling. No visible canal hematoma. Bilateral thyroid nodules measuring up to 1 cm, incidental based on size and demographics. Disc  levels:  Diffuse disc and facet degeneration. Upper chest: No acute finding. IMPRESSION: No evidence of acute intracranial or cervical spine injury. Electronically Signed   By: Monte Fantasia M.D.   On: 03/19/2017 14:06   Ct Cervical Spine Wo Contrast  Result Date: 03/19/2017 CLINICAL DATA:  Fall last night. High clinical risk. Initial encounter. EXAM: CT HEAD WITHOUT CONTRAST CT CERVICAL SPINE WITHOUT CONTRAST TECHNIQUE: Multidetector CT imaging of the head and cervical spine was performed following the standard protocol without intravenous contrast. Multiplanar CT image reconstructions of the cervical spine were also  generated. COMPARISON:  Brain MRI 03/02/2016. Head and cervical spine CT 05/10/2010 FINDINGS: CT HEAD FINDINGS Brain: No evidence of acute infarction, hemorrhage, hydrocephalus, extra-axial collection or mass lesion/mass effect. Atrophy with ventriculomegaly. Mild for age chronic small vessel ischemia. Vascular: Atherosclerotic calcification.  No hyperdense vessel. Skull: Negative for fracture. Sinuses/Orbits: No evidence of injury CT CERVICAL SPINE FINDINGS Alignment: No traumatic malalignment. Degenerative reversal of cervical lordosis that is progressed from 2011. Mild C7-T1 anterolisthesis. Skull base and vertebrae: Negative for fracture. Soft tissues and spinal canal: No prevertebral fluid or swelling. No visible canal hematoma. Bilateral thyroid nodules measuring up to 1 cm, incidental based on size and demographics. Disc levels:  Diffuse disc and facet degeneration. Upper chest: No acute finding. IMPRESSION: No evidence of acute intracranial or cervical spine injury. Electronically Signed   By: Monte Fantasia M.D.   On: 03/19/2017 14:06    Assessment & Plan:   There are no diagnoses linked to this encounter.   No orders of the defined types were placed in this encounter.    Follow-up: No follow-ups on file.  [name], MD

## 2017-12-21 NOTE — Assessment & Plan Note (Signed)
BP Readings from Last 3 Encounters:  12/21/17 128/62  12/09/17 126/78  09/20/17 124/68

## 2017-12-21 NOTE — Assessment & Plan Note (Signed)
Midodrine 10 mg tid - max dose; Prozac, Mestinon

## 2017-12-21 NOTE — Assessment & Plan Note (Signed)
start Abilify low dose

## 2017-12-21 NOTE — Assessment & Plan Note (Signed)
No worse

## 2017-12-28 ENCOUNTER — Ambulatory Visit (INDEPENDENT_AMBULATORY_CARE_PROVIDER_SITE_OTHER): Payer: Medicare Other | Admitting: Family

## 2017-12-28 ENCOUNTER — Ambulatory Visit (HOSPITAL_BASED_OUTPATIENT_CLINIC_OR_DEPARTMENT_OTHER): Payer: Medicare Other | Attending: Internal Medicine | Admitting: Radiology

## 2017-12-28 ENCOUNTER — Encounter: Payer: Self-pay | Admitting: Family

## 2017-12-28 ENCOUNTER — Ambulatory Visit (INDEPENDENT_AMBULATORY_CARE_PROVIDER_SITE_OTHER)
Admission: RE | Admit: 2017-12-28 | Discharge: 2017-12-28 | Disposition: A | Discharge status: Home / Self Care | Payer: Medicare Other | Source: Ambulatory Visit | Attending: Family | Admitting: Family

## 2017-12-28 VITALS — BP 130/64 | HR 75 | Temp 97.9°F | Ht 64.0 in

## 2017-12-28 DIAGNOSIS — R079 Chest pain, unspecified: Secondary | ICD-10-CM

## 2017-12-28 DIAGNOSIS — I951 Orthostatic hypotension: Secondary | ICD-10-CM

## 2017-12-28 DIAGNOSIS — R0781 Pleurodynia: Secondary | ICD-10-CM

## 2017-12-28 DIAGNOSIS — S299XXA Unspecified injury of thorax, initial encounter: Secondary | ICD-10-CM | POA: Diagnosis not present

## 2017-12-28 DIAGNOSIS — G4733 Obstructive sleep apnea (adult) (pediatric): Secondary | ICD-10-CM

## 2017-12-28 NOTE — Patient Instructions (Signed)

## 2017-12-28 NOTE — Progress Notes (Signed)
Sabrina Page is a 84 y.o. female with the following history as recorded in [employer]:  Patient Active Problem List   Diagnosis Date Noted  . Dementia 06/10/2017  . Orthostatic hypotension dysautonomic syndrome ([place]) 03/20/2017  . Knee pain, right 12/27/2016  . Fall   . Closed right hip fracture ([place]) 04/15/2016  . Hip fracture ([place]) 04/15/2016  . Syncope due to orthostatic hypotension 01/18/2016  . Orthostatic hypotension 01/18/2016  . Hip pain, chronic 12/25/2015  . Carotid bruit 06/26/2015  . Fall at home 02/01/2015  . Concussion without loss of consciousness 09/13/2014  . Morbid obesity ([place]) 03/06/2014  . Right tennis elbow 01/02/2014  . Wart 01/02/2014  . Chronic fatigue 06/25/2013  . External hemorrhoid, bleeding 02/14/2013  . OSA on CPAP 02/10/2012  . Low back pain radiating to both legs 05/14/2011  . Falls frequently 05/14/2011  . Fatigue 04/22/2011  . Hyperglycemia 04/22/2011  . Neoplasm of uncertain behavior of skin 01/11/2011  . Anxiety state 07/06/2010  . SHOULDER PAIN 05/21/2010  . CONCUSSION WITH LOSS OF CONSCIOUSNESS 05/21/2010  . Actinic keratosis 04/08/2010  . CARPAL TUNNEL SYNDROME 11/28/2009  . NECK PAIN 11/28/2009  . DIZZINESS 11/28/2009  . INSOMNIA, CHRONIC 06/23/2009  . TOBACCO USE, QUIT 04/11/2009  . DIVERTICULOSIS OF COLON 09/06/2008  . DIVERTICULITIS OF COLON 09/06/2008  . Unspecified chest pain 09/06/2008  . GERD 06/05/2008  . OSTEOARTHRITIS 06/05/2008  . ABNORMAL GLUCOSE NEC 06/05/2008  . BRONCHITIS, ACUTE 04/04/2008  . APHTHOUS STOMATITIS 03/25/2008  . Venous (peripheral) insufficiency 11/03/2007  . Edema 11/03/2007  . SHINGLES 10/23/2007  . DENTAL PAIN 10/23/2007  . PARESTHESIA 10/23/2007  . Pain in limb 07/06/2007  . HYPERLIPIDEMIA 02/17/2007  . Depression 02/17/2007  . Essential hypertension 02/17/2007  . ALLERGIC RHINITIS 02/17/2007  . Asthma, moderate persistent, well-controlled 02/17/2007  . Dyspnea on exertion  02/17/2007  . SYMPTOM, INCONTINENCE, URGE 02/17/2007    Current Outpatient Medications  Medication Sig Dispense Refill  . ARIPiprazole (ABILIFY) 2 MG tablet Take 1 tablet (2 mg total) by mouth daily. 30 tablet 5  . aspirin 81 MG EC tablet Take 1 tablet (81 mg total) by mouth 2 (two) times daily. 60 tablet 0  . B Complex-C (SUPER B COMPLEX PO) Take 1 tablet by mouth every morning.     . Cholecalciferol 1000 UNITS capsule Take 1,000 Units by mouth every morning.     . fludrocortisone (FLORINEF) 0.1 MG tablet Take 1 tablet (0.1 mg total) by mouth daily. 90 tablet 3  . FLUoxetine (PROZAC) 40 MG capsule Take 1 capsule (40 mg total) by mouth daily. 90 capsule 3  . loratadine (CLARITIN) 10 MG tablet TAKE 1 TABLET BY MOUTH EVERY MORNING 100 tablet 2  . midodrine (PROAMATINE) 10 MG tablet Take 1 tablet (10 mg total) by mouth 3 (three) times daily. 270 tablet 3  . mometasone-formoterol (DULERA) 200-5 MCG/ACT AERO Inhale into the lungs.    . Omega-3 Fatty Acids (FISH OIL) 1000 MG CAPS Take 1 capsule by mouth every morning.     Marland Kitchen omeprazole (PRILOSEC) 20 MG capsule Take 2 capsules (40 mg total) by mouth daily. 180 capsule 3  . potassium chloride (KLOR-CON) 8 MEQ tablet Take 1 tablet (8 mEq total) by mouth every morning. 90 tablet 3  . pyridostigmine (MESTINON) 60 MG tablet Take 1 tablet (60 mg total) by mouth 3 (three) times daily. 270 tablet 3  . traMADol (ULTRAM) 50 MG tablet Take 1 tablet (50 mg total) by mouth every 12 (twelve) hours as  needed for severe pain. 100 tablet 2   No current facility-administered medications for this visit.     Allergies: Atorvastatin; Enalapril maleate; Hctz [hydrochlorothiazide]; Lovastatin; Namenda [memantine hcl]; Simvastatin; and Topamax [topiramate]  Past Medical History:  Diagnosis Date  . Allergy    rhinitis  . Asthma   . Chronic kidney disease    hx of cystitis   . COPD (chronic obstructive pulmonary disease) ([place])   . Depression   . Dizziness    vertigo    . Gallstones   . GERD (gastroesophageal reflux disease)   . Hyperlipidemia   . Hypertension   . Obesity   . Osteoarthritis   . Osteoporosis   . Peripheral vascular disease ([facility])    swelling   . Pneumonia   . Shortness of breath    with exertion   . Shoulder injury    left  . Sleep apnea    never diagnosed   . Urinary incontinence     Past Surgical History:  Procedure Laterality Date  . CHOLECYSTECTOMY  09/14/2011   Procedure: LAPAROSCOPIC CHOLECYSTECTOMY;  Surgeon: Odis Hollingshead, MD;  Location: [facility];  Service: General;  Laterality: N/A;  attempted intraoperative cholangiogram   . HIP ARTHROPLASTY Right 04/17/2016   Procedure: ARTHROPLASTY BIPOLAR HIP (HEMIARTHROPLASTY);  Surgeon: Paralee Cancel, MD;  Location: [facility];  Service: Orthopedics;  Laterality: Right;  . L TKR    . LIVER BIOPSY  09/14/2011   Procedure: LIVER BIOPSY;  Surgeon: Odis Hollingshead, MD;  Location: [facility];  Service: General;  Laterality: N/A;  . OTHER SURGICAL HISTORY     left leg surgery   . OTHER SURGICAL HISTORY     left breast surgery due to mastitis   . right left knee arthroplast      Family History  Problem Relation Age of Onset  . Hypertension Mother   . Arthritis Mother   . Diabetes Mother   . Stroke Mother   . Stroke Father   . Hypertension Father   . Hypertension Other   . CAD Neg Hx     Social History   Tobacco Use  . Smoking status: Former Smoker    Packs/day: 0.50    Years: 35.00    Pack years: 17.50    Types: Cigarettes    Last attempt to quit: 05/24/2001    Years since quitting: 16.6  . Smokeless tobacco: Never Used  Substance Use Topics  . Alcohol use: Yes    Comment: socially     Subjective:  Patient is brought to the office by her daughter; apparently, the patient rolled out of the bed last Wednesday evening; had to call EMS to help her get out of the floor; concerned that she may have broken a right rib; notes that today she is actually starting to feel a little  better; hurts to take a deep breath on inspiration; not coughing up blood; no wheezing, no chest pain on exertion. Daughter is also requesting to have home health referral updated to [facility] ( not [facility] as originally requested) for nursing evaluation, PT and possibly OT; patient has orthostatic hypotension, fatigue, leg weakness;   Objective:  Vitals:   12/28/17 0929  BP: 130/64  Pulse: 75  Temp: 97.9 F (36.6 C)  TempSrc: Oral  SpO2: 94%  Height: 5\' 4"  (1.626 m)    General: Well developed, well nourished, in no acute distress  Skin : Warm and dry.  Head: Normocephalic and atraumatic  Lungs: Respirations  unlabored; clear to auscultation bilaterally without wheeze, rales, rhonchi  Musculoskeletal: No deformities; no active joint inflammation/ bruising noted over right ribs Extremities: No edema, cyanosis, clubbing  Vessels: Symmetric bilaterally  Neurologic: Alert and oriented; speech intact; face symmetrical; moves all extremities well; in wheelchair; CNII-XII intact without focal deficit  Assessment:  1. Rib pain   2. Chest pain, unspecified type   3. Orthostatic hypotension     Plan:  1. & 2. Update rib and CXR; discussed use of ice/ pillow as needed for support under the right breast; she understands that there is no specific treatment if rib is broken; follow-up to be determined. 3. Referral updated to Home Health as requested.   No follow-ups on file.  Orders Placed This Encounter  Procedures  . DG Chest 2 View    Standing Status:   Future    Number of Occurrences:   1    Standing Expiration Date:   02/28/2019    Order Specific Question:   Reason for Exam (SYMPTOM  OR DIAGNOSIS REQUIRED)    Answer:   chest pain    Order Specific Question:   Preferred imaging location?    Answer:   [facility]    Order Specific Question:   Radiology Contrast Protocol - do NOT remove file path    Answer:   \\[email or web address]   . DG Ribs Unilateral Right    Standing Status:   Future    Number of Occurrences:   1    Standing Expiration Date:   02/28/2019    Order Specific Question:   Reason for Exam (SYMPTOM  OR DIAGNOSIS REQUIRED)    Answer:   rib pain    Order Specific Question:   Preferred imaging location?    Answer:   [facility]    Order Specific Question:   Radiology Contrast Protocol - do NOT remove file path    Answer:   \\[email or web address]  . Ambulatory referral to Home Health    Referral Priority:   Routine    Referral Type:   Home Health Care    Referral Reason:   Specialty Services Required    Requested Specialty:   [place]    Number of Visits Requested:   1    Requested Prescriptions    No prescriptions requested or ordered in this encounter

## 2017-12-30 ENCOUNTER — Telehealth: Payer: Self-pay | Admitting: Internal Medicine

## 2017-12-30 DIAGNOSIS — G4733 Obstructive sleep apnea (adult) (pediatric): Secondary | ICD-10-CM | POA: Diagnosis not present

## 2017-12-30 DIAGNOSIS — M1990 Unspecified osteoarthritis, unspecified site: Secondary | ICD-10-CM | POA: Diagnosis not present

## 2017-12-30 DIAGNOSIS — I739 Peripheral vascular disease, unspecified: Secondary | ICD-10-CM | POA: Diagnosis not present

## 2017-12-30 DIAGNOSIS — F329 Major depressive disorder, single episode, unspecified: Secondary | ICD-10-CM | POA: Diagnosis not present

## 2017-12-30 DIAGNOSIS — I5030 Unspecified diastolic (congestive) heart failure: Secondary | ICD-10-CM | POA: Diagnosis not present

## 2017-12-30 DIAGNOSIS — M4800 Spinal stenosis, site unspecified: Secondary | ICD-10-CM | POA: Diagnosis not present

## 2017-12-30 DIAGNOSIS — J449 Chronic obstructive pulmonary disease, unspecified: Secondary | ICD-10-CM | POA: Diagnosis not present

## 2017-12-30 DIAGNOSIS — G908 Other disorders of autonomic nervous system: Secondary | ICD-10-CM | POA: Diagnosis not present

## 2017-12-30 DIAGNOSIS — E669 Obesity, unspecified: Secondary | ICD-10-CM | POA: Diagnosis not present

## 2017-12-30 DIAGNOSIS — N189 Chronic kidney disease, unspecified: Secondary | ICD-10-CM | POA: Diagnosis not present

## 2017-12-30 NOTE — Telephone Encounter (Signed)
Copied from [place] [phone]. Topic: Quick Communication - See Telephone Encounter >> Dec 30, 2017  2:38 PM Bea Graff, NT wrote: CRM for notification. See Telephone encounter for: 12/30/17. [name] with [facility] calling to request verbal orders for PT 2 times a week for 3 weeks for balance and activity tolerance. CB#: [phone]

## 2017-12-31 NOTE — Telephone Encounter (Signed)
Ok Thx 

## 2018-01-02 DIAGNOSIS — G908 Other disorders of autonomic nervous system: Secondary | ICD-10-CM | POA: Diagnosis not present

## 2018-01-02 DIAGNOSIS — M1990 Unspecified osteoarthritis, unspecified site: Secondary | ICD-10-CM | POA: Diagnosis not present

## 2018-01-02 DIAGNOSIS — I5030 Unspecified diastolic (congestive) heart failure: Secondary | ICD-10-CM | POA: Diagnosis not present

## 2018-01-02 DIAGNOSIS — M4800 Spinal stenosis, site unspecified: Secondary | ICD-10-CM | POA: Diagnosis not present

## 2018-01-02 DIAGNOSIS — J449 Chronic obstructive pulmonary disease, unspecified: Secondary | ICD-10-CM | POA: Diagnosis not present

## 2018-01-02 DIAGNOSIS — F329 Major depressive disorder, single episode, unspecified: Secondary | ICD-10-CM | POA: Diagnosis not present

## 2018-01-02 NOTE — Telephone Encounter (Signed)
Called Sabrina Page no answer LMOM w/MD response.Marland KitchenJohny Page

## 2018-01-06 DIAGNOSIS — M1990 Unspecified osteoarthritis, unspecified site: Secondary | ICD-10-CM | POA: Diagnosis not present

## 2018-01-06 DIAGNOSIS — F329 Major depressive disorder, single episode, unspecified: Secondary | ICD-10-CM | POA: Diagnosis not present

## 2018-01-06 DIAGNOSIS — G908 Other disorders of autonomic nervous system: Secondary | ICD-10-CM | POA: Diagnosis not present

## 2018-01-06 DIAGNOSIS — I5030 Unspecified diastolic (congestive) heart failure: Secondary | ICD-10-CM | POA: Diagnosis not present

## 2018-01-06 DIAGNOSIS — M4800 Spinal stenosis, site unspecified: Secondary | ICD-10-CM | POA: Diagnosis not present

## 2018-01-06 DIAGNOSIS — J449 Chronic obstructive pulmonary disease, unspecified: Secondary | ICD-10-CM | POA: Diagnosis not present

## 2018-01-09 DIAGNOSIS — F329 Major depressive disorder, single episode, unspecified: Secondary | ICD-10-CM | POA: Diagnosis not present

## 2018-01-09 DIAGNOSIS — J449 Chronic obstructive pulmonary disease, unspecified: Secondary | ICD-10-CM | POA: Diagnosis not present

## 2018-01-09 DIAGNOSIS — G908 Other disorders of autonomic nervous system: Secondary | ICD-10-CM | POA: Diagnosis not present

## 2018-01-09 DIAGNOSIS — M1990 Unspecified osteoarthritis, unspecified site: Secondary | ICD-10-CM | POA: Diagnosis not present

## 2018-01-09 DIAGNOSIS — M4800 Spinal stenosis, site unspecified: Secondary | ICD-10-CM | POA: Diagnosis not present

## 2018-01-09 DIAGNOSIS — I5030 Unspecified diastolic (congestive) heart failure: Secondary | ICD-10-CM | POA: Diagnosis not present

## 2018-01-12 DIAGNOSIS — I5030 Unspecified diastolic (congestive) heart failure: Secondary | ICD-10-CM | POA: Diagnosis not present

## 2018-01-12 DIAGNOSIS — J449 Chronic obstructive pulmonary disease, unspecified: Secondary | ICD-10-CM | POA: Diagnosis not present

## 2018-01-12 DIAGNOSIS — M1990 Unspecified osteoarthritis, unspecified site: Secondary | ICD-10-CM | POA: Diagnosis not present

## 2018-01-12 DIAGNOSIS — F329 Major depressive disorder, single episode, unspecified: Secondary | ICD-10-CM | POA: Diagnosis not present

## 2018-01-12 DIAGNOSIS — G908 Other disorders of autonomic nervous system: Secondary | ICD-10-CM | POA: Diagnosis not present

## 2018-01-12 DIAGNOSIS — M4800 Spinal stenosis, site unspecified: Secondary | ICD-10-CM | POA: Diagnosis not present

## 2018-01-18 DIAGNOSIS — M4800 Spinal stenosis, site unspecified: Secondary | ICD-10-CM | POA: Diagnosis not present

## 2018-01-18 DIAGNOSIS — G908 Other disorders of autonomic nervous system: Secondary | ICD-10-CM | POA: Diagnosis not present

## 2018-01-18 DIAGNOSIS — I5030 Unspecified diastolic (congestive) heart failure: Secondary | ICD-10-CM | POA: Diagnosis not present

## 2018-01-18 DIAGNOSIS — M1990 Unspecified osteoarthritis, unspecified site: Secondary | ICD-10-CM | POA: Diagnosis not present

## 2018-01-18 DIAGNOSIS — J449 Chronic obstructive pulmonary disease, unspecified: Secondary | ICD-10-CM | POA: Diagnosis not present

## 2018-01-18 DIAGNOSIS — F329 Major depressive disorder, single episode, unspecified: Secondary | ICD-10-CM | POA: Diagnosis not present

## 2018-01-19 ENCOUNTER — Other Ambulatory Visit: Payer: Self-pay | Admitting: Internal Medicine

## 2018-01-19 NOTE — Telephone Encounter (Signed)
Pt had 30 day supply with 5 refills sent 12/21/17, pt requesting 90 day supply.  Okay to fill?

## 2018-01-21 DIAGNOSIS — G908 Other disorders of autonomic nervous system: Secondary | ICD-10-CM | POA: Diagnosis not present

## 2018-01-21 DIAGNOSIS — M4800 Spinal stenosis, site unspecified: Secondary | ICD-10-CM | POA: Diagnosis not present

## 2018-01-21 DIAGNOSIS — J449 Chronic obstructive pulmonary disease, unspecified: Secondary | ICD-10-CM | POA: Diagnosis not present

## 2018-01-21 DIAGNOSIS — I5030 Unspecified diastolic (congestive) heart failure: Secondary | ICD-10-CM | POA: Diagnosis not present

## 2018-01-21 DIAGNOSIS — M1990 Unspecified osteoarthritis, unspecified site: Secondary | ICD-10-CM | POA: Diagnosis not present

## 2018-01-21 DIAGNOSIS — F329 Major depressive disorder, single episode, unspecified: Secondary | ICD-10-CM | POA: Diagnosis not present

## 2018-01-24 ENCOUNTER — Telehealth: Payer: Self-pay | Admitting: Internal Medicine

## 2018-01-24 NOTE — Telephone Encounter (Signed)
Copied from [place] [id number]. Topic: General - Other >> Jan 24, 2018  1:30 PM Yvette Rack wrote: Reason for CRM: Junie Panning PT from [facility] home health [phone] calling for verbal order PT 2 x a week for 3 weeks

## 2018-01-25 DIAGNOSIS — G908 Other disorders of autonomic nervous system: Secondary | ICD-10-CM | POA: Diagnosis not present

## 2018-01-25 DIAGNOSIS — F329 Major depressive disorder, single episode, unspecified: Secondary | ICD-10-CM | POA: Diagnosis not present

## 2018-01-25 DIAGNOSIS — I5030 Unspecified diastolic (congestive) heart failure: Secondary | ICD-10-CM | POA: Diagnosis not present

## 2018-01-25 DIAGNOSIS — M4800 Spinal stenosis, site unspecified: Secondary | ICD-10-CM | POA: Diagnosis not present

## 2018-01-25 DIAGNOSIS — J449 Chronic obstructive pulmonary disease, unspecified: Secondary | ICD-10-CM | POA: Diagnosis not present

## 2018-01-25 DIAGNOSIS — M1990 Unspecified osteoarthritis, unspecified site: Secondary | ICD-10-CM | POA: Diagnosis not present

## 2018-01-25 NOTE — Telephone Encounter (Signed)
Ok Thx 

## 2018-01-25 NOTE — Telephone Encounter (Signed)
Called [name] no answer LMOM w/MD response.Marland KitchenJohny Chess

## 2018-01-27 ENCOUNTER — Ambulatory Visit: Payer: Self-pay | Admitting: Internal Medicine

## 2018-01-27 DIAGNOSIS — I5030 Unspecified diastolic (congestive) heart failure: Secondary | ICD-10-CM | POA: Diagnosis not present

## 2018-01-27 DIAGNOSIS — M4800 Spinal stenosis, site unspecified: Secondary | ICD-10-CM | POA: Diagnosis not present

## 2018-01-27 DIAGNOSIS — F329 Major depressive disorder, single episode, unspecified: Secondary | ICD-10-CM | POA: Diagnosis not present

## 2018-01-27 DIAGNOSIS — J449 Chronic obstructive pulmonary disease, unspecified: Secondary | ICD-10-CM | POA: Diagnosis not present

## 2018-01-27 DIAGNOSIS — M1990 Unspecified osteoarthritis, unspecified site: Secondary | ICD-10-CM | POA: Diagnosis not present

## 2018-01-27 DIAGNOSIS — G908 Other disorders of autonomic nervous system: Secondary | ICD-10-CM | POA: Diagnosis not present

## 2018-01-27 NOTE — Telephone Encounter (Signed)
Called poison Control as per protocol. Information from assessment questions given to Poison Control. Pt's medication list and pt's diagnosis list, [name]'s number given to poison control. Poison Control to follow up on pt. Reason for Disposition . Took another person's prescription drug  Answer Assessment - Initial Assessment Questions 1. SYMPTOMS: "Do you have any symptoms?"     Pt is c/o weakness and dizziness with mild slurred speech. Pt took husbands medication: Zyprexa, benazepril. [name] (home health PT assistant) initially thought pt had taken her clonazepam today. Daughter stated that pt has not taken her clonazepam today.   2. SEVERITY: If symptoms are present, ask "Are they mild, moderate or severe?"     Pt c/o weakness, dizziness and mild slurred speech. Junie Panning took patients vitals signs: BP sitting 110/80 HR 83; Standing 84/60 ([name] stated low BP is not a new problem for pt) and BP supine: 132/77.   Called PCP office and spoke with Lovena Le and informed her of the above information. Lovena Le stated she informed Dr Alain Marion. Lovena Le stated that Dr Alain Marion said that the pt does not have to go to the ED, unless pt worsens. Per Lovena Le, Dr Plotnikov advised the pt not to take her Abilify or Tramadol today.  Protocols used: MEDICATION QUESTION CALL-A-AH

## 2018-01-30 NOTE — Telephone Encounter (Signed)
PCP was already advised

## 2018-02-01 ENCOUNTER — Telehealth: Payer: Self-pay | Admitting: Internal Medicine

## 2018-02-01 DIAGNOSIS — J449 Chronic obstructive pulmonary disease, unspecified: Secondary | ICD-10-CM | POA: Diagnosis not present

## 2018-02-01 DIAGNOSIS — M4800 Spinal stenosis, site unspecified: Secondary | ICD-10-CM | POA: Diagnosis not present

## 2018-02-01 DIAGNOSIS — G908 Other disorders of autonomic nervous system: Secondary | ICD-10-CM | POA: Diagnosis not present

## 2018-02-01 DIAGNOSIS — I5030 Unspecified diastolic (congestive) heart failure: Secondary | ICD-10-CM | POA: Diagnosis not present

## 2018-02-01 DIAGNOSIS — F329 Major depressive disorder, single episode, unspecified: Secondary | ICD-10-CM | POA: Diagnosis not present

## 2018-02-01 DIAGNOSIS — M1990 Unspecified osteoarthritis, unspecified site: Secondary | ICD-10-CM | POA: Diagnosis not present

## 2018-02-01 NOTE — Telephone Encounter (Signed)
Copied from [place] [id number]. Topic: General - Other >> Feb 01, 2018 12:51 PM Judyann Munson wrote: Reason for CRM:  Junie Panning PT from [facility] home health [phone] calling to state the patient blood pressure was 178/92. Patient was moving boxes and Junie Panning  advise her of safety , stated the patient Blood pressure went down to 148/88 after ten minutes.  Call back number is [name]

## 2018-02-01 NOTE — Telephone Encounter (Signed)
Noted. Thx.

## 2018-02-03 DIAGNOSIS — M4800 Spinal stenosis, site unspecified: Secondary | ICD-10-CM | POA: Diagnosis not present

## 2018-02-03 DIAGNOSIS — J449 Chronic obstructive pulmonary disease, unspecified: Secondary | ICD-10-CM | POA: Diagnosis not present

## 2018-02-03 DIAGNOSIS — M1990 Unspecified osteoarthritis, unspecified site: Secondary | ICD-10-CM | POA: Diagnosis not present

## 2018-02-03 DIAGNOSIS — F329 Major depressive disorder, single episode, unspecified: Secondary | ICD-10-CM | POA: Diagnosis not present

## 2018-02-03 DIAGNOSIS — G908 Other disorders of autonomic nervous system: Secondary | ICD-10-CM | POA: Diagnosis not present

## 2018-02-03 DIAGNOSIS — I5030 Unspecified diastolic (congestive) heart failure: Secondary | ICD-10-CM | POA: Diagnosis not present

## 2018-02-08 DIAGNOSIS — F329 Major depressive disorder, single episode, unspecified: Secondary | ICD-10-CM | POA: Diagnosis not present

## 2018-02-08 DIAGNOSIS — J449 Chronic obstructive pulmonary disease, unspecified: Secondary | ICD-10-CM | POA: Diagnosis not present

## 2018-02-08 DIAGNOSIS — M1990 Unspecified osteoarthritis, unspecified site: Secondary | ICD-10-CM | POA: Diagnosis not present

## 2018-02-08 DIAGNOSIS — I5030 Unspecified diastolic (congestive) heart failure: Secondary | ICD-10-CM | POA: Diagnosis not present

## 2018-02-08 DIAGNOSIS — G908 Other disorders of autonomic nervous system: Secondary | ICD-10-CM | POA: Diagnosis not present

## 2018-02-08 DIAGNOSIS — M4800 Spinal stenosis, site unspecified: Secondary | ICD-10-CM | POA: Diagnosis not present

## 2018-02-10 DIAGNOSIS — J449 Chronic obstructive pulmonary disease, unspecified: Secondary | ICD-10-CM | POA: Diagnosis not present

## 2018-02-10 DIAGNOSIS — M4800 Spinal stenosis, site unspecified: Secondary | ICD-10-CM | POA: Diagnosis not present

## 2018-02-10 DIAGNOSIS — I5030 Unspecified diastolic (congestive) heart failure: Secondary | ICD-10-CM | POA: Diagnosis not present

## 2018-02-10 DIAGNOSIS — F329 Major depressive disorder, single episode, unspecified: Secondary | ICD-10-CM | POA: Diagnosis not present

## 2018-02-10 DIAGNOSIS — G908 Other disorders of autonomic nervous system: Secondary | ICD-10-CM | POA: Diagnosis not present

## 2018-02-10 DIAGNOSIS — M1990 Unspecified osteoarthritis, unspecified site: Secondary | ICD-10-CM | POA: Diagnosis not present

## 2018-02-15 DIAGNOSIS — M4800 Spinal stenosis, site unspecified: Secondary | ICD-10-CM | POA: Diagnosis not present

## 2018-02-15 DIAGNOSIS — I5030 Unspecified diastolic (congestive) heart failure: Secondary | ICD-10-CM | POA: Diagnosis not present

## 2018-02-15 DIAGNOSIS — J449 Chronic obstructive pulmonary disease, unspecified: Secondary | ICD-10-CM | POA: Diagnosis not present

## 2018-02-15 DIAGNOSIS — M1990 Unspecified osteoarthritis, unspecified site: Secondary | ICD-10-CM | POA: Diagnosis not present

## 2018-02-15 DIAGNOSIS — G908 Other disorders of autonomic nervous system: Secondary | ICD-10-CM | POA: Diagnosis not present

## 2018-02-15 DIAGNOSIS — F329 Major depressive disorder, single episode, unspecified: Secondary | ICD-10-CM | POA: Diagnosis not present

## 2018-02-16 ENCOUNTER — Telehealth: Payer: Self-pay | Admitting: Internal Medicine

## 2018-02-16 NOTE — Telephone Encounter (Signed)
Copied from [place] [phone]. Topic: Quick Communication - See Telephone Encounter >> Feb 16, 2018  3:06 PM Rutherford Nail, [place] wrote: CRM for notification. See Telephone encounter for: 02/16/18. [name] with [facility]) calling and is requesting verbal orders for physical therapy.  1 more visit for next week since patient missed an appointment this week.  CB#: [phone]

## 2018-02-17 NOTE — Telephone Encounter (Signed)
FYI,  LM giving orders

## 2018-02-19 NOTE — Telephone Encounter (Signed)
Ok Thx 

## 2018-02-22 DIAGNOSIS — M4800 Spinal stenosis, site unspecified: Secondary | ICD-10-CM | POA: Diagnosis not present

## 2018-02-22 DIAGNOSIS — J449 Chronic obstructive pulmonary disease, unspecified: Secondary | ICD-10-CM | POA: Diagnosis not present

## 2018-02-22 DIAGNOSIS — F329 Major depressive disorder, single episode, unspecified: Secondary | ICD-10-CM | POA: Diagnosis not present

## 2018-02-22 DIAGNOSIS — I5030 Unspecified diastolic (congestive) heart failure: Secondary | ICD-10-CM | POA: Diagnosis not present

## 2018-02-22 DIAGNOSIS — G908 Other disorders of autonomic nervous system: Secondary | ICD-10-CM | POA: Diagnosis not present

## 2018-02-22 DIAGNOSIS — M1990 Unspecified osteoarthritis, unspecified site: Secondary | ICD-10-CM | POA: Diagnosis not present

## 2018-03-17 ENCOUNTER — Ambulatory Visit: Payer: Medicare Other | Admitting: Neurology

## 2018-03-20 ENCOUNTER — Ambulatory Visit (INDEPENDENT_AMBULATORY_CARE_PROVIDER_SITE_OTHER): Payer: Medicare Other | Admitting: Neurology

## 2018-03-20 ENCOUNTER — Encounter: Payer: Self-pay | Admitting: Neurology

## 2018-03-20 VITALS — BP 110/78 | HR 76 | Ht 64.0 in | Wt 235.1 lb

## 2018-03-20 DIAGNOSIS — F0280 Dementia in other diseases classified elsewhere without behavioral disturbance: Secondary | ICD-10-CM

## 2018-03-20 DIAGNOSIS — G309 Alzheimer's disease, unspecified: Secondary | ICD-10-CM

## 2018-03-20 NOTE — Progress Notes (Signed)
Follow-up Visit   Date: 03/20/18    Sabrina Page MRN: [id number] DOB: [birth year]   Interim History: Sabrina Page is a 84 y.o.  female with ashtma, GERD, depression, hyperlipidemia, CKD, and peripheral vascular disease returning to the clinic for follow-up of orthostasis.  The patient was accompanied to the clinic by daughter and [place] Interpretor who also provides collateral information.    History of present illness: Starting in the spring of 2017, she began having lightheadedness especially worse in the morning which improves as the day goes on.  Every time she stands, she gets lightheaded and has had 6 falls over the past year, the last occurring in May. She does not take any antihypertensive medication or diuretics, although she has leg swelling.  She was taken to the ER on 01/18/2016 after her daughter, a home health nurse, checked her orthostatic blood pressure and her SBP was noted to be in the 60s and she was very symptomatic.  She started midodrine '10mg'$  BID in early September and has noticed 30% improvement.  She has been complaint with using a walker and feels that this helps.  Her daughter also complains that patient may have some memory loss and tends to be very suspicious about things.  She states that patient has always been very jealous.  There are no delusional thoughts or hallucinations.  Patient lives with her husband, who is also suffering from memory problems. She has a caregiver that comes for a few hours and her daughter who helps with many IADLs.  No bowel/bladder dysfunction.   In 2017, she continued to have frequent falls and ractured her right hip in November 2017.  She was living with daughter for some time and now has returned back to an apartment with her husband.  She has a caregiver that comes 9am-12pm and 5-7pm. Daughter is feeling overwhelmed because her parents do not want to live with her and she still has to manage their day-to-day  activities. Daughter manages finances and is POA for both.     In 2018, florinef 0.'1mg'$  daily was added for orthostasis which has helped.  She remains on midodine '10mg'$  TID, as long has her SBP < 160 and mestinon '60mg'$  TID.  She takes Namzeric for dementia, which is table.   UPDATE 03/20/2018:  She is here for 58-monthvisit with daughter and [place].  Daughter has noticed gradual memory deterioration and on one occasion, she accidentally took her husband's medication. She is forgetting names of family members and often repeats herself.  Mood is good.   She currently has three caregivers who come 5 hours per day and needs more in-home care for medication management.  She has purchased automated medication dispenser, but has not been able to set it up.   Othostasis is much better controlled on her regimen of florinef 0,'1mg'$  daily, mestinon '60mg'$  TID, and midodrine '10mg'$  TID.  Medications:  Current Outpatient Medications on File Prior to Visit  Medication Sig Dispense Refill  . ARIPiprazole (ABILIFY) 2 MG tablet TAKE 1 TABLET BY MOUTH EVERY DAY 90 tablet 3  . aspirin 81 MG EC tablet Take 1 tablet (81 mg total) by mouth 2 (two) times daily. 60 tablet 0  . B Complex-C (SUPER B COMPLEX PO) Take 1 tablet by mouth every morning.     . Cholecalciferol 1000 UNITS capsule Take 1,000 Units by mouth every morning.     . fludrocortisone (FLORINEF) 0.1 MG tablet Take 1 tablet (0.1 mg total) by mouth  daily. 90 tablet 3  . FLUoxetine (PROZAC) 40 MG capsule Take 1 capsule (40 mg total) by mouth daily. 90 capsule 3  . loratadine (CLARITIN) 10 MG tablet TAKE 1 TABLET BY MOUTH EVERY MORNING 100 tablet 2  . midodrine (PROAMATINE) 10 MG tablet Take 1 tablet (10 mg total) by mouth 3 (three) times daily. 270 tablet 3  . mometasone-formoterol (DULERA) 200-5 MCG/ACT AERO Inhale into the lungs.    . Omega-3 Fatty Acids (FISH OIL) 1000 MG CAPS Take 1 capsule by mouth every morning.     Marland Kitchen omeprazole (PRILOSEC) 20 MG  capsule Take 2 capsules (40 mg total) by mouth daily. 180 capsule 3  . potassium chloride (KLOR-CON) 8 MEQ tablet Take 1 tablet (8 mEq total) by mouth every morning. 90 tablet 3  . pyridostigmine (MESTINON) 60 MG tablet Take 1 tablet (60 mg total) by mouth 3 (three) times daily. 270 tablet 3  . traMADol (ULTRAM) 50 MG tablet Take 1 tablet (50 mg total) by mouth every 12 (twelve) hours as needed for severe pain. 100 tablet 2   No current facility-administered medications on file prior to visit.     Allergies:  Allergies  Allergen Reactions  . Atorvastatin     REACTION: upset stomach  . Enalapril Maleate   . Hctz [Hydrochlorothiazide]     Dizzy   . Lovastatin     REACTION: tongue stress  . Namenda [Memantine Hcl]     Feeling bad  . Simvastatin     REACTION: mouth sores  . Topamax [Topiramate]     syncope    Review of Systems:  CONSTITUTIONAL: No fevers, chills, night sweats, or weight loss.  EYES: No visual changes or eye pain ENT: No hearing changes.  No history of nose bleeds.   RESPIRATORY: No cough, wheezing and shortness of breath.   CARDIOVASCULAR: Negative for chest pain, and palpitations.   GI: Negative for abdominal discomfort, blood in stools or black stools.  No recent change in bowel habits.   GU:  No history of incontinence.   MUSCLOSKELETAL: No history of joint pain or swelling.  No myalgias.   SKIN: Negative for lesions, rash, and itching.   ENDOCRINE: Negative for cold or heat intolerance, polydipsia or goiter.   PSYCH:  No depression +anxiety symptoms.   NEURO: As Above.   Vital Signs:  BP 110/78   Pulse 76   Ht '5\' 4"'$  (1.626 m)   Wt 235 lb 2 oz (106.7 kg)   SpO2 95%   BMI 40.36 kg/m   General Medical Exam:   General:  Well appearing, comfortable  Eyes/ENT: see cranial nerve examination.   Neck: No masses appreciated.  Full range of motion without tenderness.  No carotid bruits. Respiratory:  Clear to auscultation, good air entry bilaterally.     Cardiac:  Regular rate and rhythm, no murmur.   Ext:  1+ pitting edema, not wearing compression stockings  Neurological Exam: MENTAL STATUS including orientation to time, place, person, recent and remote memory, attention span and concentration, language, and fund of knowledge is fairly intact.  Speech has strong accent.  CRANIAL NERVES:  Pupils equal round and reactive to light.  Normal conjugate, extra-ocular eye movements in all directions of gaze.  No ptosis.  Face is symmetric. Palate elevates symmetrically.  Tongue is midline.  MOTOR:  Motor strength is 5/5 in all extremities. Tone is normal.    COORDINATION/GAIT:  Normal finger-to- nose-finger.  Intact rapid alternating movements bilaterally.  Gait not  tested, patient in wheelchair.   Data: US carotids 07/28/2015:  1-39% bilateral ICA stenosis  Labs 02/17/2016:  vitamin B12 556, vitamin B6 32.7,  SSA/B neg, copper 109, SPEP with IFE no M protein, ESR 15  MRI brain wo contrast 03/02/2016:  No acute infarct or intracranial hemorrhage.  Mild chronic microvascular changes. Moderate global atrophy. Ventricular prominence probably secondary to central atrophy rather than hydrocephalus. No intracranial mass lesion noted on this unenhanced exam. Mild cervical spondylotic changes C4-5 incompletely assessed.  IMPRESSION/PLAN: 1.  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  Improved since adding florinef.   - Continue florinef 0.'1mg'$  daily              - Continue midodrine to '10mg'$  TID, do not take if SBP > 160   - Continue mestinon '60mg'$  TID              - Encouraged to use rollator at all times when walking              - Encouraged compliance with wearing knee high compression stockings  2.  Alzheimer's dementia without behavior changes, gradual progression as expected with the course of the disease  - Daughter is POA and manages medications and finances.  - Continue Namzeric  - Home health referral for  nursing care - medication management and social work for home safety/placement needs and Advanced care review  3.  Advanced care planning.  Lengthy discussion with patient and daughter regarding advanced care goals.  Patient has not thought about her wishes nor disclosed this to her family.  I reviewed various scenarios and management options and urged her to discuss this further with her family.   Return to clinic in 6 months  Greater than 50% of this 30 minute visit was spent in counseling, explanation of diagnosis, planning of further management, and coordination of care.    Thank you for allowing me to participate in patient's care.  If I can answer any additional questions, I would be pleased to do so.    Sincerely,    Donika K. Posey Pronto, DO

## 2018-03-20 NOTE — Patient Instructions (Signed)
Referral to home health for nursing care (medication management) and social worker   Continue medications as you are taking  Please wear compression stockings  Return to clinic in 6 months

## 2018-03-21 ENCOUNTER — Other Ambulatory Visit: Payer: Self-pay | Admitting: *Deleted

## 2018-03-21 DIAGNOSIS — F0280 Dementia in other diseases classified elsewhere without behavioral disturbance: Secondary | ICD-10-CM

## 2018-03-21 DIAGNOSIS — G309 Alzheimer's disease, unspecified: Principal | ICD-10-CM

## 2018-03-23 ENCOUNTER — Ambulatory Visit (INDEPENDENT_AMBULATORY_CARE_PROVIDER_SITE_OTHER): Payer: Medicare Other | Admitting: Internal Medicine

## 2018-03-23 ENCOUNTER — Encounter: Payer: Self-pay | Admitting: Internal Medicine

## 2018-03-23 ENCOUNTER — Other Ambulatory Visit (INDEPENDENT_AMBULATORY_CARE_PROVIDER_SITE_OTHER): Payer: Medicare Other

## 2018-03-23 VITALS — BP 114/78 | HR 92 | Temp 98.0°F | Ht 64.0 in | Wt 234.0 lb

## 2018-03-23 DIAGNOSIS — I10 Essential (primary) hypertension: Secondary | ICD-10-CM | POA: Diagnosis not present

## 2018-03-23 DIAGNOSIS — G903 Multi-system degeneration of the autonomic nervous system: Secondary | ICD-10-CM | POA: Diagnosis not present

## 2018-03-23 DIAGNOSIS — I951 Orthostatic hypotension: Secondary | ICD-10-CM | POA: Diagnosis not present

## 2018-03-23 DIAGNOSIS — F0280 Dementia in other diseases classified elsewhere without behavioral disturbance: Secondary | ICD-10-CM | POA: Diagnosis not present

## 2018-03-23 DIAGNOSIS — M545 Low back pain: Secondary | ICD-10-CM

## 2018-03-23 DIAGNOSIS — G309 Alzheimer's disease, unspecified: Secondary | ICD-10-CM | POA: Diagnosis not present

## 2018-03-23 DIAGNOSIS — Z23 Encounter for immunization: Secondary | ICD-10-CM

## 2018-03-23 DIAGNOSIS — M79604 Pain in right leg: Secondary | ICD-10-CM

## 2018-03-23 LAB — BASIC METABOLIC PANEL
BUN: 17 mg/dL (ref 6–23)
CO2: 30 mEq/L (ref 19–32)
Calcium: 9.3 mg/dL (ref 8.4–10.5)
Chloride: 103 mEq/L (ref 96–112)
Creatinine, Ser: 1.09 mg/dL (ref 0.40–1.20)
GFR: 51.02 mL/min — ABNORMAL LOW (ref >60.00)
Glucose, Bld: 171 mg/dL — ABNORMAL HIGH (ref 70–99)
Potassium: 4.5 mEq/L (ref 3.5–5.1)
Sodium: 139 mEq/L (ref 135–145)

## 2018-03-23 NOTE — Assessment & Plan Note (Signed)
On Midodrine 10 mg tid - max dose; Prozac, Mestinon

## 2018-03-23 NOTE — Assessment & Plan Note (Signed)
No relapse of LOC

## 2018-03-23 NOTE — Assessment & Plan Note (Signed)
C/o less pain Tramadol prn

## 2018-03-23 NOTE — Progress Notes (Signed)
Subjective:  Patient ID: Sabrina Page, female    DOB: [birth year]  Age: 84 y.o. MRN: [id number]  CC: No chief complaint on file.   HPI Sabrina Page presents for memory loss, spinal stenosis, gait disorder f/u  Outpatient Medications Prior to Visit  Medication Sig Dispense Refill  . ARIPiprazole (ABILIFY) 2 MG tablet TAKE 1 TABLET BY MOUTH EVERY DAY 90 tablet 3  . aspirin 81 MG EC tablet Take 1 tablet (81 mg total) by mouth 2 (two) times daily. 60 tablet 0  . B Complex-C (SUPER B COMPLEX PO) Take 1 tablet by mouth every morning.     . Cholecalciferol 1000 UNITS capsule Take 1,000 Units by mouth every morning.     . fludrocortisone (FLORINEF) 0.1 MG tablet Take 1 tablet (0.1 mg total) by mouth daily. 90 tablet 3  . FLUoxetine (PROZAC) 40 MG capsule Take 1 capsule (40 mg total) by mouth daily. 90 capsule 3  . loratadine (CLARITIN) 10 MG tablet TAKE 1 TABLET BY MOUTH EVERY MORNING 100 tablet 2  . midodrine (PROAMATINE) 10 MG tablet Take 1 tablet (10 mg total) by mouth 3 (three) times daily. 270 tablet 3  . mometasone-formoterol (DULERA) 200-5 MCG/ACT AERO Inhale into the lungs.    . Omega-3 Fatty Acids (FISH OIL) 1000 MG CAPS Take 1 capsule by mouth every morning.     Marland Kitchen omeprazole (PRILOSEC) 20 MG capsule Take 2 capsules (40 mg total) by mouth daily. 180 capsule 3  . potassium chloride (KLOR-CON) 8 MEQ tablet Take 1 tablet (8 mEq total) by mouth every morning. 90 tablet 3  . pyridostigmine (MESTINON) 60 MG tablet Take 1 tablet (60 mg total) by mouth 3 (three) times daily. 270 tablet 3  . traMADol (ULTRAM) 50 MG tablet Take 1 tablet (50 mg total) by mouth every 12 (twelve) hours as needed for severe pain. 100 tablet 2   No facility-administered medications prior to visit.     ROS: Review of Systems  Constitutional: Negative for activity change, appetite change, chills, fatigue and unexpected weight change.  HENT: Negative for congestion, mouth sores and sinus pressure.    Eyes: Negative for visual disturbance.  Respiratory: Negative for cough and chest tightness.   Gastrointestinal: Negative for abdominal pain and nausea.  Genitourinary: Negative for difficulty urinating, frequency and vaginal pain.  Musculoskeletal: Positive for arthralgias, back pain and gait problem.  Skin: Negative for pallor and rash.  Neurological: Negative for dizziness, tremors, weakness, numbness and headaches.  Psychiatric/Behavioral: Positive for behavioral problems, confusion and decreased concentration. Negative for sleep disturbance and suicidal ideas. The patient is nervous/anxious.     Objective:  BP 114/78 (BP Location: Left Arm, Patient Position: Sitting, Cuff Size: Large)   Pulse 92   Temp 98 F (36.7 C) (Oral)   Ht 5\' 4"  (1.626 m)   Wt 234 lb (106.1 kg)   SpO2 93%   BMI 40.17 kg/m   BP Readings from Last 3 Encounters:  03/23/18 114/78  03/20/18 110/78  12/28/17 130/64    Wt Readings from Last 3 Encounters:  03/23/18 234 lb (106.1 kg)  03/20/18 235 lb 2 oz (106.7 kg)  12/21/17 231 lb (104.8 kg)    Physical Exam  Constitutional: She appears well-developed. No distress.  HENT:  Head: Normocephalic.  Right Ear: External ear normal.  Left Ear: External ear normal.  Nose: Nose normal.  Mouth/Throat: Oropharynx is clear and moist.  Eyes: Pupils are equal, round, and reactive to light. Conjunctivae are normal. Right eye  exhibits no discharge. Left eye exhibits no discharge.  Neck: Normal range of motion. Neck supple. No JVD present. No tracheal deviation present. No thyromegaly present.  Cardiovascular: Normal rate, regular rhythm and normal heart sounds.  Pulmonary/Chest: No stridor. No respiratory distress. She has no wheezes.  Abdominal: Soft. Bowel sounds are normal. She exhibits no distension and no mass. There is no tenderness. There is no rebound and no guarding.  Musculoskeletal: She exhibits no edema or tenderness.  Lymphadenopathy:    She has no  cervical adenopathy.  Neurological: She displays normal reflexes. No cranial nerve deficit. She exhibits normal muscle tone. Coordination normal.  Skin: No rash noted. No erythema.  Psychiatric: She has a normal mood and affect. Her behavior is normal. Judgment and thought content normal.  in a w/c A/c Lab Results  Component Value Date   WBC 5.1 12/21/2017   HGB 13.9 12/21/2017   HCT 42.6 12/21/2017   PLT 183.0 12/21/2017   GLUCOSE 93 12/21/2017   CHOL 270 (H) 06/12/2014   TRIG 132.0 06/12/2014   HDL 52.40 06/12/2014   LDLDIRECT 159.1 05/16/2012   LDLCALC 191 (H) 06/12/2014   ALT 8 12/21/2017   AST 12 12/21/2017   NA 140 12/21/2017   K 4.0 12/21/2017   CL 103 12/21/2017   CREATININE 1.01 12/21/2017   BUN 17 12/21/2017   CO2 34 (H) 12/21/2017   TSH 1.75 12/21/2017   INR 1.07 04/16/2016   HGBA1C 5.6 12/25/2015    Dg Chest 2 View  Result Date: 12/28/2017 CLINICAL DATA:  Chest pain and right flank pain secondary to a fall 7 days ago. EXAM: CHEST - 2 VIEW COMPARISON:  Rib radiographs dated 12/28/2017 and chest x-ray dated 04/18/2016 FINDINGS: The heart size and mediastinal contours are within normal limits. Both lungs are clear. The visualized skeletal structures are unremarkable. IMPRESSION: No active cardiopulmonary disease. Electronically Signed   By: Lorriane Shire M.D.   On: 12/28/2017 10:45   Dg Ribs Unilateral Right  Result Date: 12/28/2017 CLINICAL DATA:  Status post fall 7 days ago with right rib pain. EXAM: RIGHT RIBS - 2 VIEW COMPARISON:  April 18, 2016 FINDINGS: No fracture or other bone lesions are seen involving the ribs. Prior cholecystectomy clips are noted. IMPRESSION: No acute fracture or dislocation of right ribs. Electronically Signed   By: Abelardo Diesel M.D.   On: 12/28/2017 10:44    Assessment & Plan:   There are no diagnoses linked to this encounter.   No orders of the defined types were placed in this encounter.    Follow-up: No follow-ups on  file.  [name], MD

## 2018-03-23 NOTE — Assessment & Plan Note (Signed)
Abilify

## 2018-03-23 NOTE — Addendum Note (Signed)
Addended by: Karren Cobble on: 03/23/2018 01:03 PM   Modules accepted: Orders

## 2018-03-23 NOTE — Assessment & Plan Note (Signed)
Indapamide po (stopped) 

## 2018-03-25 ENCOUNTER — Other Ambulatory Visit: Payer: Self-pay | Admitting: Internal Medicine

## 2018-03-25 IMAGING — DX DG LUMBAR SPINE COMPLETE 4+V
5 series · 5 of 5 positions shown · non-contrast
Comparison: Lumbar spine MRI 05/26/2011

CLINICAL DATA: Weakness with back pain. Multiple falls over the
past 2 days. Initial encounter.

EXAM:
LUMBAR SPINE - COMPLETE 4+ VIEW

[l-spine ap]
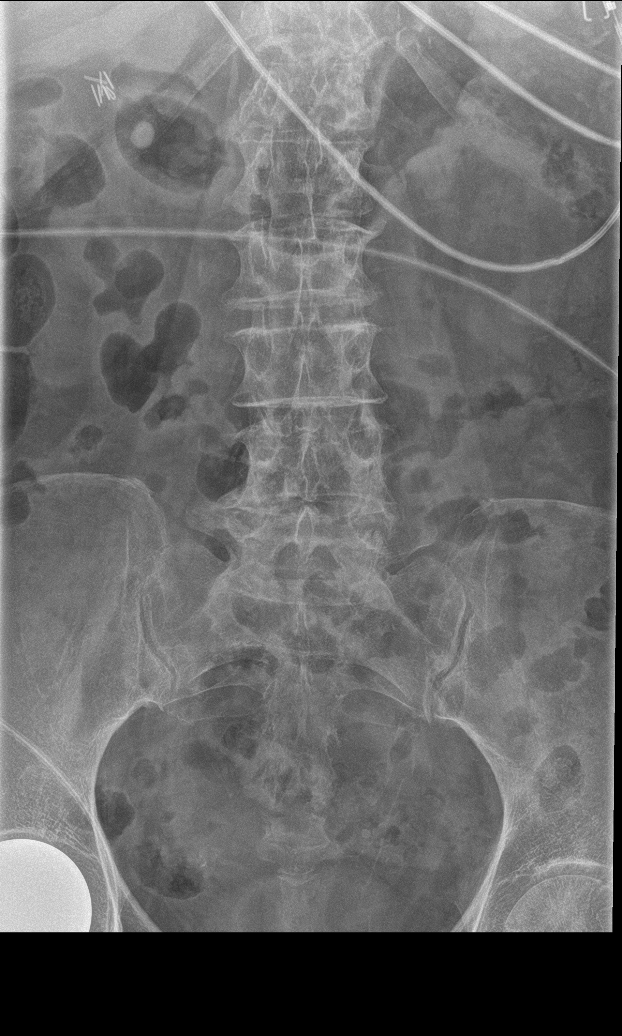

[l-spine obl (1 of 2)]
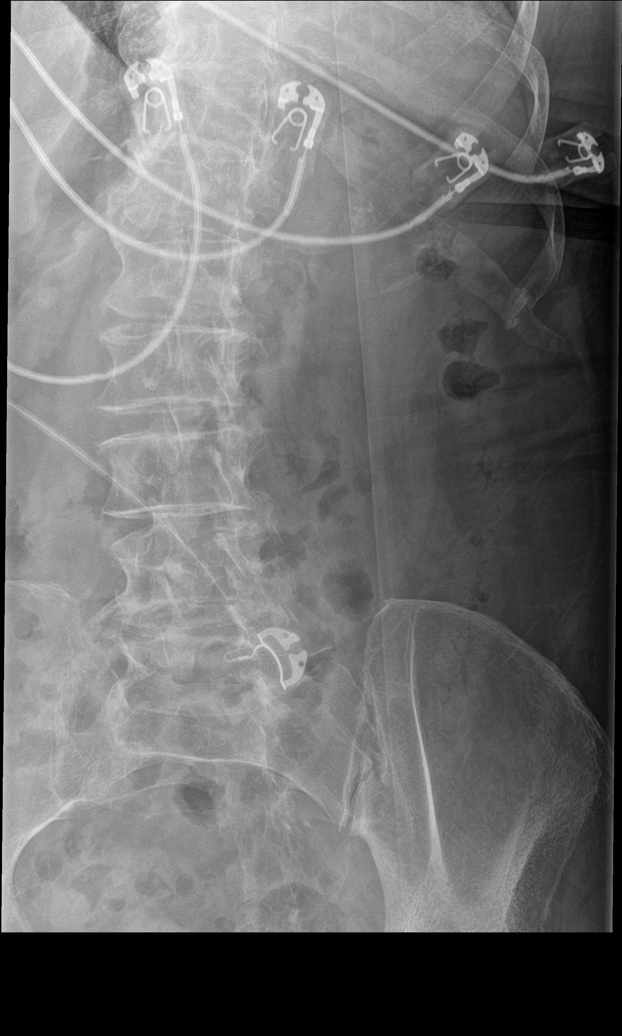

[l-spine obl (2 of 2)]
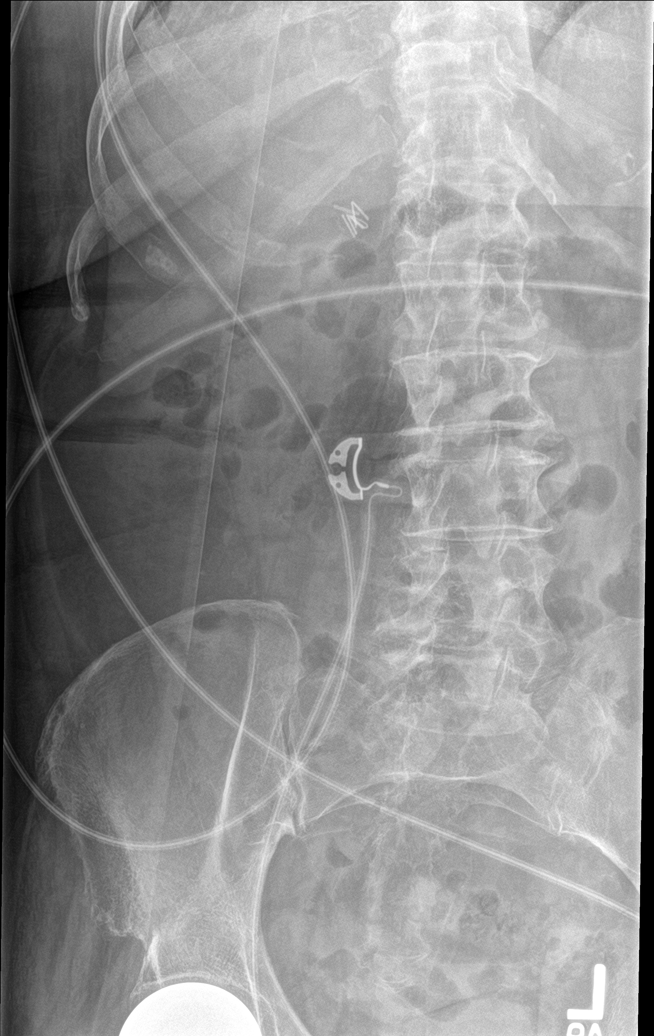

[l-spine lat]
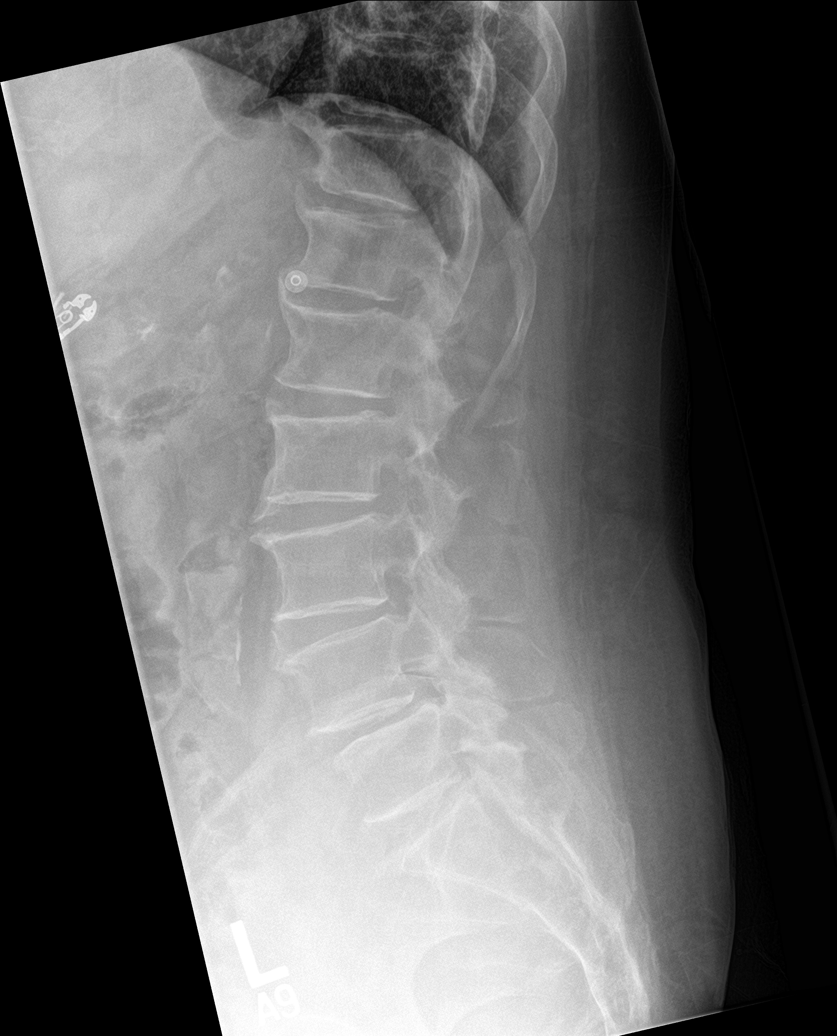

[l-spine spot]
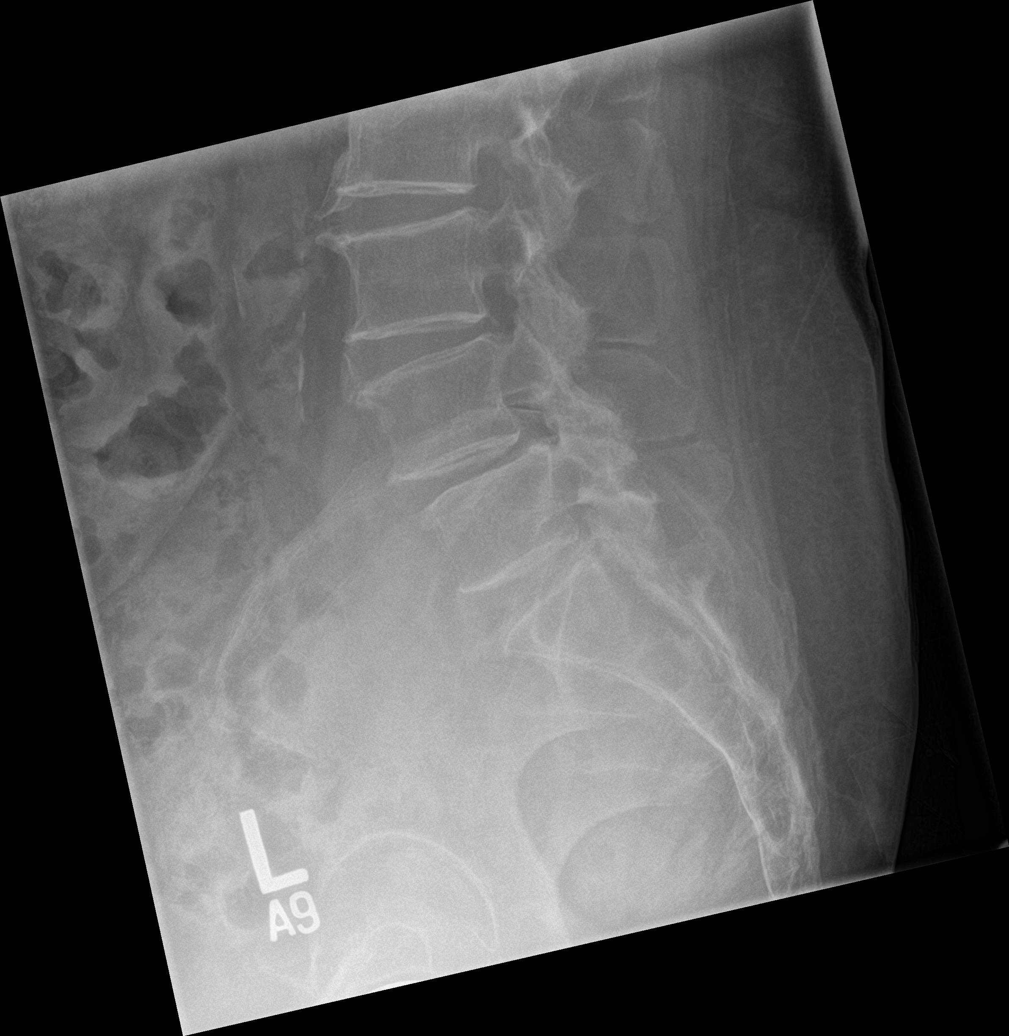

[5 of 5 positions shown; findings below may reference images not displayed]

FINDINGS: Probable hypoplastic ribs at T12 with 5 lumbar type vertebral
bodies.

Negative for acute fracture or traumatic malalignment. Lower lumbar
facet arthropathy most notable at L4-5 where there is grade 1
anterolisthesis. Generalized spondylosis and degenerative disc
narrowing, greatest at L4-5. Osteopenia. Atherosclerosis.
IMPRESSION: 1. No acute finding.
2. Lumbar spine degeneration with L4-5 anterolisthesis.

## 2018-03-27 DIAGNOSIS — I739 Peripheral vascular disease, unspecified: Secondary | ICD-10-CM | POA: Diagnosis not present

## 2018-03-27 DIAGNOSIS — F0280 Dementia in other diseases classified elsewhere without behavioral disturbance: Secondary | ICD-10-CM | POA: Diagnosis not present

## 2018-03-27 DIAGNOSIS — M6281 Muscle weakness (generalized): Secondary | ICD-10-CM | POA: Diagnosis not present

## 2018-03-27 DIAGNOSIS — I951 Orthostatic hypotension: Secondary | ICD-10-CM | POA: Diagnosis not present

## 2018-03-27 DIAGNOSIS — R2689 Other abnormalities of gait and mobility: Secondary | ICD-10-CM | POA: Diagnosis not present

## 2018-03-27 DIAGNOSIS — G309 Alzheimer's disease, unspecified: Secondary | ICD-10-CM | POA: Diagnosis not present

## 2018-03-31 ENCOUNTER — Telehealth: Payer: Self-pay | Admitting: Neurology

## 2018-03-31 DIAGNOSIS — G309 Alzheimer's disease, unspecified: Secondary | ICD-10-CM | POA: Diagnosis not present

## 2018-03-31 DIAGNOSIS — I739 Peripheral vascular disease, unspecified: Secondary | ICD-10-CM | POA: Diagnosis not present

## 2018-03-31 DIAGNOSIS — F0280 Dementia in other diseases classified elsewhere without behavioral disturbance: Secondary | ICD-10-CM | POA: Diagnosis not present

## 2018-03-31 DIAGNOSIS — R2689 Other abnormalities of gait and mobility: Secondary | ICD-10-CM | POA: Diagnosis not present

## 2018-03-31 DIAGNOSIS — I951 Orthostatic hypotension: Secondary | ICD-10-CM | POA: Diagnosis not present

## 2018-03-31 DIAGNOSIS — M6281 Muscle weakness (generalized): Secondary | ICD-10-CM | POA: Diagnosis not present

## 2018-03-31 NOTE — Telephone Encounter (Signed)
Noted  

## 2018-03-31 NOTE — Telephone Encounter (Signed)
Home care PT [name], calling to let Dr.Patel know that they did an evaluation of pt and are going to continue service 2x a week for 2 weeks and 1x a week for  2 weeks.

## 2018-04-04 DIAGNOSIS — G309 Alzheimer's disease, unspecified: Secondary | ICD-10-CM | POA: Diagnosis not present

## 2018-04-04 DIAGNOSIS — I739 Peripheral vascular disease, unspecified: Secondary | ICD-10-CM | POA: Diagnosis not present

## 2018-04-04 DIAGNOSIS — M6281 Muscle weakness (generalized): Secondary | ICD-10-CM | POA: Diagnosis not present

## 2018-04-04 DIAGNOSIS — I951 Orthostatic hypotension: Secondary | ICD-10-CM | POA: Diagnosis not present

## 2018-04-04 DIAGNOSIS — R2689 Other abnormalities of gait and mobility: Secondary | ICD-10-CM | POA: Diagnosis not present

## 2018-04-04 DIAGNOSIS — F0280 Dementia in other diseases classified elsewhere without behavioral disturbance: Secondary | ICD-10-CM | POA: Diagnosis not present

## 2018-04-05 DIAGNOSIS — I739 Peripheral vascular disease, unspecified: Secondary | ICD-10-CM | POA: Diagnosis not present

## 2018-04-05 DIAGNOSIS — R2689 Other abnormalities of gait and mobility: Secondary | ICD-10-CM | POA: Diagnosis not present

## 2018-04-05 DIAGNOSIS — M6281 Muscle weakness (generalized): Secondary | ICD-10-CM | POA: Diagnosis not present

## 2018-04-05 DIAGNOSIS — G309 Alzheimer's disease, unspecified: Secondary | ICD-10-CM | POA: Diagnosis not present

## 2018-04-05 DIAGNOSIS — I951 Orthostatic hypotension: Secondary | ICD-10-CM | POA: Diagnosis not present

## 2018-04-05 DIAGNOSIS — F0280 Dementia in other diseases classified elsewhere without behavioral disturbance: Secondary | ICD-10-CM | POA: Diagnosis not present

## 2018-04-07 DIAGNOSIS — R2689 Other abnormalities of gait and mobility: Secondary | ICD-10-CM | POA: Diagnosis not present

## 2018-04-07 DIAGNOSIS — G309 Alzheimer's disease, unspecified: Secondary | ICD-10-CM | POA: Diagnosis not present

## 2018-04-07 DIAGNOSIS — I739 Peripheral vascular disease, unspecified: Secondary | ICD-10-CM | POA: Diagnosis not present

## 2018-04-07 DIAGNOSIS — F0280 Dementia in other diseases classified elsewhere without behavioral disturbance: Secondary | ICD-10-CM | POA: Diagnosis not present

## 2018-04-07 DIAGNOSIS — I951 Orthostatic hypotension: Secondary | ICD-10-CM | POA: Diagnosis not present

## 2018-04-07 DIAGNOSIS — M6281 Muscle weakness (generalized): Secondary | ICD-10-CM | POA: Diagnosis not present

## 2018-04-12 DIAGNOSIS — I739 Peripheral vascular disease, unspecified: Secondary | ICD-10-CM | POA: Diagnosis not present

## 2018-04-12 DIAGNOSIS — M6281 Muscle weakness (generalized): Secondary | ICD-10-CM | POA: Diagnosis not present

## 2018-04-12 DIAGNOSIS — F0280 Dementia in other diseases classified elsewhere without behavioral disturbance: Secondary | ICD-10-CM | POA: Diagnosis not present

## 2018-04-12 DIAGNOSIS — R2689 Other abnormalities of gait and mobility: Secondary | ICD-10-CM | POA: Diagnosis not present

## 2018-04-12 DIAGNOSIS — I951 Orthostatic hypotension: Secondary | ICD-10-CM | POA: Diagnosis not present

## 2018-04-12 DIAGNOSIS — G309 Alzheimer's disease, unspecified: Secondary | ICD-10-CM | POA: Diagnosis not present

## 2018-04-13 ENCOUNTER — Ambulatory Visit (INDEPENDENT_AMBULATORY_CARE_PROVIDER_SITE_OTHER): Payer: Medicare Other | Admitting: Internal Medicine

## 2018-04-13 ENCOUNTER — Encounter: Payer: Self-pay | Admitting: Internal Medicine

## 2018-04-13 VITALS — BP 138/70 | HR 77 | Ht 64.0 in | Wt 237.0 lb

## 2018-04-13 DIAGNOSIS — J4540 Moderate persistent asthma, uncomplicated: Secondary | ICD-10-CM | POA: Diagnosis not present

## 2018-04-13 DIAGNOSIS — G4734 Idiopathic sleep related nonobstructive alveolar hypoventilation: Secondary | ICD-10-CM

## 2018-04-13 DIAGNOSIS — J9611 Chronic respiratory failure with hypoxia: Secondary | ICD-10-CM

## 2018-04-13 MED ORDER — IPRATROPIUM-ALBUTEROL 0.5-2.5 (3) MG/3ML IN SOLN: RESPIRATORY_TRACT | 12 refills | Status: DC

## 2018-04-13 MED ORDER — COMPRESSOR NEBULIZER MISC: 1.0000 | Freq: Once | 0 refills | Status: AC

## 2018-04-13 NOTE — Progress Notes (Signed)
Subjective:    Patient ID: Sabrina Page, female    DOB: 05-Jun-1933, 84 y.o.   MRN: [id number]  HPI female former smoker from [place],  followed for OSA, chronic insomnia, complicated by asthma, allergic rhinitis, HBP, depression (husband has Alzheimer's) Unattended Home Sleep Test 07/30/13  AHI 19/ hr, desat to 78%,  Weight 226 lbs Office Spirometry 02/13/2015-WNL Overnight Oximetry-12/16/2017-qualified for supplemental O2 during sleep. ----------------------------------------------------------------------------------- 12/09/2017- 84 year old female former smoker followed for OSA, chronic insomnia, complicated by asthma, allergic rhinitis, HBP, depression CPAP auto 5-15/[facility]    daughter here as Optometrist -------She is having a hard time with just the nasal mask and daughter says that she did much better with full face mask. She has stopped using it. Breathing has not been good she has been wheezing and SOB. PCP gave [facility] which helped but too expensive.  Today breathing very well.  Intervals of dyspnea, chest tightness and wheeze may relate to weather changes but unclear.  We discussed alternatives to [facility] that might be cheaper. She has not been using CPAP reliably saying she does not like her mask and would like to go back to a full facemask.  Daughter says mother is somewhat confused in the mornings if she misses her CPAP.  Take it off in her sleep.  They deny parasomnias.  04/13/2018- 84 year old female former smoker followed for OSA, chronic insomnia, asthma, allergic rhinitiscomplicated by , HBP, depression Alzheimer's dementia, prostatic hypertension CPAP auto 5-15/[facility]>> dc'd and replaced with O2 at this visit.     daughter and Optometrist both here -----Follows for: asthma, OSA, she is trying to use her CPAP, very confused when she wake up, daughter notices more memory loss Dulera 200, Claritin,  She continues to pull CPAP off almost as soon her daughter puts it on  her. Overnight oximetry documented qualification for supplemental oxygen during sleep, recording 27 minutes with saturation less than or equal to 88%.  We discussed distinction between oxygen and CPAP Her primary physician has made a diagnosis of Alzheimer's-daughter was unaware. Problems with Dulera inhaler-cost and availability, as well as some concern about her remembering proper technique.  Wheezing seems to be occasional. CXR 12/28/2017-  No active cardiopulmonary disease. Overnight Oximetry-12/16/2017-qualified for supplemental O2 during sleep.  Resting saturation on room air while awake today 96%.  ROS-see HPI   + = positive Constitutional:    weight loss, night sweats, fevers, chills, fatigue, lassitude. HEENT:    headaches, difficulty swallowing, tooth/dental problems, sore throat,       sneezing, itching, ear ache, nasal congestion, post nasal drip, snoring CV:    chest pain, orthopnea, PND, swelling in lower extremities, anasarca,                                  dizziness, palpitations Resp:   +shortness of breath with exertion or at rest.                productive cough,   non-productive cough, coughing up of blood.              change in color of mucus.  +wheezing.   Skin:    rash or lesions. GI:  No-   heartburn, indigestion, abdominal pain, nausea, vomiting, diarrhea,                 change in bowel habits, loss of appetite GU: dysuria, change in color of urine, no urgency or frequency.  flank pain. MS:   joint pain, stiffness, decreased range of motion, back pain. Neuro-     nothing unusual Psych:  change in mood or affect.  depression or anxiety.   memory loss.      Objective:  OBJ- Physical Exam + limited English/daughter translated General- Alert, Oriented, Affect-appropriate, Distress- none acute, + obese,  Skin- rash-none, lesions- none, excoriation- none Lymphadenopathy- none Head- atraumatic            Eyes- Gross vision intact, PERRLA, conjunctivae and  secretions clear            Ears- Hearing, canals-normal            Nose- Clear, no-Septal dev, mucus, polyps, erosion, perforation             Throat- Mallampati III , mucosa clear , drainage- none, tonsils- atrophic, + full dentures Neck- flexible , trachea midline, no stridor , thyroid nl, carotid no bruit Chest - symmetrical excursion , unlabored           Heart/CV- RRR , no murmur , no gallop  , no rub, nl s1 s2                           - JVD- none , edema- none, stasis changes- none, varices- none           Lung- clear, wheeze- none, cough- none , dullness-none, rub- none           Chest wall-  Abd-  Br/ Gen/ Rectal- Not done, not indicated Extrem- cyanosis- none, clubbing, none, atrophy- none, strength- nl Neuro- grossly intact to observation  Assessment & Plan:

## 2018-04-13 NOTE — Patient Instructions (Signed)
Order- DME [facility]: D/C CPAP- patient is unable to use. Start home O2 2L for sleep, based on ONOX 12/16/17    Dx Nocturnal hypoxemia  Start home nebulizer for Duoneb every 6 hours if needed Scripts are printed Try this instead of an inhaler like Ruthe Mannan

## 2018-04-14 DIAGNOSIS — M6281 Muscle weakness (generalized): Secondary | ICD-10-CM | POA: Diagnosis not present

## 2018-04-14 DIAGNOSIS — R2689 Other abnormalities of gait and mobility: Secondary | ICD-10-CM | POA: Diagnosis not present

## 2018-04-14 DIAGNOSIS — J9611 Chronic respiratory failure with hypoxia: Secondary | ICD-10-CM | POA: Insufficient documentation

## 2018-04-14 DIAGNOSIS — I951 Orthostatic hypotension: Secondary | ICD-10-CM | POA: Diagnosis not present

## 2018-04-14 DIAGNOSIS — I739 Peripheral vascular disease, unspecified: Secondary | ICD-10-CM | POA: Diagnosis not present

## 2018-04-14 DIAGNOSIS — G309 Alzheimer's disease, unspecified: Secondary | ICD-10-CM | POA: Diagnosis not present

## 2018-04-14 DIAGNOSIS — F0280 Dementia in other diseases classified elsewhere without behavioral disturbance: Secondary | ICD-10-CM | POA: Diagnosis not present

## 2018-04-14 NOTE — Assessment & Plan Note (Signed)
Nocturnal hypoxemia reflecting combination of underlying lung disease, obstructive sleep apnea and obesity hypoventilation.  She is intolerant of CPAP. Plan-O2 during sleep 2 L with discussion done.

## 2018-04-14 NOTE — Assessment & Plan Note (Signed)
She has not been consistent and often does not remember she has Dulera. Plan-replace Dulera with nebulizer DuoNeb which her daughter can help her to use.

## 2018-04-18 ENCOUNTER — Other Ambulatory Visit: Payer: Self-pay | Admitting: Internal Medicine

## 2018-04-18 ENCOUNTER — Telehealth: Payer: Self-pay | Admitting: Internal Medicine

## 2018-04-18 DIAGNOSIS — F0280 Dementia in other diseases classified elsewhere without behavioral disturbance: Secondary | ICD-10-CM | POA: Diagnosis not present

## 2018-04-18 DIAGNOSIS — I951 Orthostatic hypotension: Secondary | ICD-10-CM | POA: Diagnosis not present

## 2018-04-18 DIAGNOSIS — G309 Alzheimer's disease, unspecified: Secondary | ICD-10-CM | POA: Diagnosis not present

## 2018-04-18 DIAGNOSIS — I739 Peripheral vascular disease, unspecified: Secondary | ICD-10-CM | POA: Diagnosis not present

## 2018-04-18 DIAGNOSIS — M6281 Muscle weakness (generalized): Secondary | ICD-10-CM | POA: Diagnosis not present

## 2018-04-18 DIAGNOSIS — R2689 Other abnormalities of gait and mobility: Secondary | ICD-10-CM | POA: Diagnosis not present

## 2018-04-18 NOTE — Telephone Encounter (Signed)
ATC James but was unable to reach him nor left a VM. Per patient's chart, she doesn't need the O2 during the day, just at night. She has an ONO in her chart that proves this. Will attempt to call him back.

## 2018-04-19 DIAGNOSIS — I739 Peripheral vascular disease, unspecified: Secondary | ICD-10-CM | POA: Diagnosis not present

## 2018-04-19 DIAGNOSIS — R2689 Other abnormalities of gait and mobility: Secondary | ICD-10-CM | POA: Diagnosis not present

## 2018-04-19 DIAGNOSIS — I951 Orthostatic hypotension: Secondary | ICD-10-CM | POA: Diagnosis not present

## 2018-04-19 DIAGNOSIS — F0280 Dementia in other diseases classified elsewhere without behavioral disturbance: Secondary | ICD-10-CM | POA: Diagnosis not present

## 2018-04-19 DIAGNOSIS — G309 Alzheimer's disease, unspecified: Secondary | ICD-10-CM | POA: Diagnosis not present

## 2018-04-19 DIAGNOSIS — M6281 Muscle weakness (generalized): Secondary | ICD-10-CM | POA: Diagnosis not present

## 2018-04-19 NOTE — Telephone Encounter (Signed)
Called and spoke with Jeneen Rinks, he stated that the patient has OSA documented all in her chart as well as it showing she has been on CPAP. Jeneen Rinks stated that per patients insurance she will need to be given a walk test as well as have an OV with a secondary diagnosis. If patient does the walk test and we can get her qualified of COPD then we can get patient a concentrator and she can have it prescribed for just night time.   Jeneen Rinks stated that he will be more than willing to come by the office to talk about it, per medicare the guidelines are very strict.   CY please advise, thank you.

## 2018-04-24 DIAGNOSIS — I739 Peripheral vascular disease, unspecified: Secondary | ICD-10-CM | POA: Diagnosis not present

## 2018-04-24 DIAGNOSIS — R2689 Other abnormalities of gait and mobility: Secondary | ICD-10-CM | POA: Diagnosis not present

## 2018-04-24 DIAGNOSIS — G309 Alzheimer's disease, unspecified: Secondary | ICD-10-CM | POA: Diagnosis not present

## 2018-04-24 DIAGNOSIS — M6281 Muscle weakness (generalized): Secondary | ICD-10-CM | POA: Diagnosis not present

## 2018-04-24 DIAGNOSIS — F0280 Dementia in other diseases classified elsewhere without behavioral disturbance: Secondary | ICD-10-CM | POA: Diagnosis not present

## 2018-04-24 DIAGNOSIS — I951 Orthostatic hypotension: Secondary | ICD-10-CM | POA: Diagnosis not present

## 2018-04-24 NOTE — Telephone Encounter (Signed)
Dr. Annamaria Boots, please advise on this for pt. Thanks!

## 2018-04-28 DIAGNOSIS — I739 Peripheral vascular disease, unspecified: Secondary | ICD-10-CM | POA: Diagnosis not present

## 2018-04-28 DIAGNOSIS — I951 Orthostatic hypotension: Secondary | ICD-10-CM | POA: Diagnosis not present

## 2018-04-28 DIAGNOSIS — M6281 Muscle weakness (generalized): Secondary | ICD-10-CM | POA: Diagnosis not present

## 2018-04-28 DIAGNOSIS — F0280 Dementia in other diseases classified elsewhere without behavioral disturbance: Secondary | ICD-10-CM | POA: Diagnosis not present

## 2018-04-28 DIAGNOSIS — G309 Alzheimer's disease, unspecified: Secondary | ICD-10-CM | POA: Diagnosis not present

## 2018-04-28 DIAGNOSIS — R2689 Other abnormalities of gait and mobility: Secondary | ICD-10-CM | POA: Diagnosis not present

## 2018-04-28 NOTE — Telephone Encounter (Signed)
Dr. Annamaria Boots, please advise on this for pt and Jeneen Rinks so we know exactly what we need to do in regards to this. Thanks!

## 2018-05-01 NOTE — Telephone Encounter (Signed)
I called and spoke with Jeneen Rinks at [facility] to clarify why the diagnosis of nocturnal hypoxemia doesn't qualify this patient for nighttime O2.  He explained to me that since the patient has a diagnosis of OSA in her chart she has to show that she uses the proper equipment and has failed. According to Jeneen Rinks, last note states at the ONO patient doesn't tolerate the CPAP and is not using it. So MEDICARE will not approve the O2 at night. However, the patient also has a diagnosis of COPD in her chart. IF she would come in for an OV specifically for COPD and do a walk test to qualify then patient could be set up for O2 and wear it at night. But as long as patient has a diagnosis of OSA, until she is using CPAP and shows it isn't working, patient will not be qualified for O2 at night.  Dr. Annamaria Boots please advise. Allergies  Allergen Reactions  . Atorvastatin     REACTION: upset stomach  . Enalapril Maleate   . Hctz [Hydrochlorothiazide]     Dizzy   . Lovastatin     REACTION: tongue stress  . Namenda [Memantine Hcl]     Feeling bad  . Simvastatin     REACTION: mouth sores  . Topamax [Topiramate]     syncope     Current Outpatient Medications on File Prior to Visit  Medication Sig Dispense Refill  . ARIPiprazole (ABILIFY) 2 MG tablet TAKE 1 TABLET BY MOUTH EVERY DAY 90 tablet 1  . aspirin 81 MG EC tablet Take 1 tablet (81 mg total) by mouth 2 (two) times daily. 60 tablet 0  . B Complex-C (SUPER B COMPLEX PO) Take 1 tablet by mouth every morning.     . Cholecalciferol 1000 UNITS capsule Take 1,000 Units by mouth every morning.     . fludrocortisone (FLORINEF) 0.1 MG tablet Take 1 tablet (0.1 mg total) by mouth daily. 90 tablet 3  . FLUoxetine (PROZAC) 40 MG capsule Take 1 capsule (40 mg total) by mouth daily. 90 capsule 3  . ipratropium-albuterol (DUONEB) 0.5-2.5 (3) MG/3ML SOLN 1 neb every 6 hours if needed 75 mL 12  . loratadine (CLARITIN) 10 MG tablet TAKE 1 TABLET BY MOUTH EVERY DAY IN THE  MORNING 90 tablet 3  . midodrine (PROAMATINE) 10 MG tablet Take 1 tablet (10 mg total) by mouth 3 (three) times daily. 270 tablet 3  . Omega-3 Fatty Acids (FISH OIL) 1000 MG CAPS Take 1 capsule by mouth every morning.     Marland Kitchen omeprazole (PRILOSEC) 20 MG capsule Take 2 capsules (40 mg total) by mouth daily. 180 capsule 3  . potassium chloride (KLOR-CON) 8 MEQ tablet Take 1 tablet (8 mEq total) by mouth every morning. 90 tablet 3  . pyridostigmine (MESTINON) 60 MG tablet Take 1 tablet (60 mg total) by mouth 3 (three) times daily. 270 tablet 3  . traMADol (ULTRAM) 50 MG tablet Take 1 tablet (50 mg total) by mouth every 12 (twelve) hours as needed for severe pain. 100 tablet 2   No current facility-administered medications on file prior to visit.

## 2018-05-02 NOTE — Telephone Encounter (Signed)
CY please advise once available. Thank you.

## 2018-05-03 NOTE — Telephone Encounter (Signed)
On 05/01/2018 I printed out phone encounter and gave to Dr. Annamaria Boots, will route to [facility] so she can follow up and see if addressed

## 2018-05-03 NOTE — Telephone Encounter (Signed)
Phone note is in CY's inbasket tray.

## 2018-05-05 NOTE — Telephone Encounter (Signed)
CY would you like for [name] to go ahead and set this patient up with a office visit so she can qualify for oxygen? Per Jeneen Rinks this visit will need to address her COPD only.   CY please advise. Thanks.

## 2018-05-05 NOTE — Telephone Encounter (Signed)
Patient still waiting for order from [place]; contacct # [phone]

## 2018-05-05 NOTE — Telephone Encounter (Signed)
We are still waiting on a response from Dr. Annamaria Boots.  Spoke with pt's daughter, Jilda Panda. She is aware that we are still waiting on response from Dr. Annamaria Boots.  Joellen Jersey - can you please help facilitate this, the message has been open since November. Thanks.

## 2018-05-08 ENCOUNTER — Telehealth: Payer: Self-pay | Admitting: Internal Medicine

## 2018-05-08 NOTE — Telephone Encounter (Signed)
Please have patient to come to the office for OV with CY to discuss COPD and O2 needs only. No OSA will be discussed. Thanks. Any held or RNA spot can be used. Thanks.

## 2018-05-08 NOTE — Telephone Encounter (Signed)
Called and spoke with patient, she has been scheduled for the appointment. See other phone note.

## 2018-05-08 NOTE — Telephone Encounter (Signed)
Attempted to call pt but no answer. Left message for pt to return call. Please when pt calls back, schedule pt an OV with CY.  Per Joellen Jersey, you can use any held or RNA spot for pt's appt. Thanks!

## 2018-05-09 NOTE — Telephone Encounter (Signed)
Pt has been scheduled for an OV with CY 12/19. Closing encounter.

## 2018-05-11 ENCOUNTER — Ambulatory Visit (INDEPENDENT_AMBULATORY_CARE_PROVIDER_SITE_OTHER): Payer: Medicare Other | Admitting: Internal Medicine

## 2018-05-11 ENCOUNTER — Encounter: Payer: Self-pay | Admitting: Internal Medicine

## 2018-05-11 DIAGNOSIS — J9611 Chronic respiratory failure with hypoxia: Secondary | ICD-10-CM | POA: Diagnosis not present

## 2018-05-11 DIAGNOSIS — G4733 Obstructive sleep apnea (adult) (pediatric): Secondary | ICD-10-CM | POA: Diagnosis not present

## 2018-05-11 NOTE — Progress Notes (Signed)
Subjective:    Patient ID: Sabrina Page, female    DOB: February 15, 1934, 84 y.o.   MRN: [id number]  HPI female former smoker from [place],  followed for OSA, chronic insomnia, complicated by asthma, allergic rhinitis, HBP, depression (husband has Alzheimer's) Unattended Home Sleep Test 07/30/13  AHI 19/ hr, desat to 78%,  Weight 226 lbs Office Spirometry 02/13/2015-WNL Overnight Oximetry-12/16/2017-qualified for supplemental O2 during sleep. ----------------------------------------------------------------------------------- 04/13/2018- 84 year old female former smoker followed for OSA, chronic insomnia, asthma, allergic rhinitiscomplicated by , HBP, depression Alzheimer's dementia, prostatic hypertension CPAP auto 5-15/[facility]>> dc'd and replaced with O2 at this visit.     daughter and Optometrist both here -----Follows for: asthma, OSA, she is trying to use her CPAP, very confused when she wake up, daughter notices more memory loss Dulera 200, Claritin,  She continues to pull CPAP off almost as soon her daughter puts it on her. Overnight oximetry documented qualification for supplemental oxygen during sleep, recording 27 minutes with saturation less than or equal to 88%.  We discussed distinction between oxygen and CPAP Her primary physician has made a diagnosis of Alzheimer's-daughter was unaware. Problems with Dulera inhaler-cost and availability, as well as some concern about her remembering proper technique.  Wheezing seems to be occasional. CXR 12/28/2017-  No active cardiopulmonary disease. Overnight Oximetry-12/16/2017-qualified for supplemental O2 during sleep.  Resting saturation on room air while awake today 96%.  05/04/2018- 84 year old female former smoker followed for chronic insomnia, asthma, allergic rhinitiscomplicated by , HBP, depression Alzheimer's dementia, prostatic hypertension CPAP auto 5-15/[facility]>> dc'd and replaced with O2 .     daughter and husband  both  here Here today because of COPD/ hypoxia planning walk test to document qualification for home oxygen to be used especially with sleep. -----Patient is really sob at night, having problem with memory according to daughter. She absolutely refuses to use CPAP. Desaturated with home physical therapy, which just ended. Walk test and supine room air oxygen saturations-minimum saturation was 90%.  ROS-see HPI   + = positive Constitutional:    weight loss, night sweats, fevers, chills, fatigue, lassitude. HEENT:    headaches, difficulty swallowing, tooth/dental problems, sore throat,       sneezing, itching, ear ache, nasal congestion, post nasal drip, snoring CV:    chest pain, orthopnea, PND, swelling in lower extremities, anasarca,                                  dizziness, palpitations Resp:   +shortness of breath with exertion or at rest.                productive cough,   non-productive cough, coughing up of blood.              change in color of mucus.  +wheezing.   Skin:    rash or lesions. GI:  No-   heartburn, indigestion, abdominal pain, nausea, vomiting, diarrhea,                 change in bowel habits, loss of appetite GU: dysuria, change in color of urine, no urgency or frequency.   flank pain. MS:   joint pain, stiffness, decreased range of motion, back pain. Neuro-     nothing unusual Psych:  change in mood or affect.  depression or anxiety.   memory loss.   Objective:  OBJ- Physical Exam + limited English/daughter translated General- Alert, Oriented, Affect-appropriate, Distress- none acute, +  obese,  Skin- rash-none, lesions- none, excoriation- none Lymphadenopathy- none Head- atraumatic            Eyes- Gross vision intact, PERRLA, conjunctivae and secretions clear            Ears- Hearing, canals-normal            Nose- Clear, no-Septal dev, mucus, polyps, erosion, perforation             Throat- Mallampati III , mucosa clear , drainage- none, tonsils- atrophic, + full  dentures Neck- flexible , trachea midline, no stridor , thyroid nl, carotid no bruit Chest - symmetrical excursion , unlabored           Heart/CV- RRR , no murmur , no gallop  , no rub, nl s1 s2                           - JVD- none , edema- none, stasis changes- none, varices- none           Lung- clear, wheeze- none, cough- none , dullness-none, rub- none           Chest wall-  Abd-  Br/ Gen/ Rectal- Not done, not indicated Extrem- cyanosis- none, clubbing, none, atrophy- none, strength- nl Neuro- grossly intact to observation  Assessment & Plan:

## 2018-05-11 NOTE — Patient Instructions (Addendum)
Order- room air O2 sat- postural              Walk test room air O2 qualifying   Try raising the head of the bed by putting 1 brick under each of the head legs of the bed. This will raise the head end of the bed about 2 inches. We are trying to shift the weight of the abdomen off of your lungs so you can breathe more easily.

## 2018-05-14 NOTE — Assessment & Plan Note (Signed)
She completely refuses to wear CPAP, is not a surgical candidate, and because of dentures cannot wear an oral appliance.  I have emphasized the importance of weight loss and sleep off a lot of back.

## 2018-05-14 NOTE — Assessment & Plan Note (Signed)
She will not wear CPAP, we cannot meet Medicare criteria for supplemental oxygen at night as that would require a witnessed CPAP titration with persistent hypoxemia.  Clinically I think the problem is obesity hypoventilation. Plan-schedule PFT.  Sleep with head of bed elevated 2 or 3 inches/1 brick.

## 2018-06-10 ENCOUNTER — Other Ambulatory Visit: Payer: Self-pay | Admitting: Internal Medicine

## 2018-06-14 ENCOUNTER — Ambulatory Visit (INDEPENDENT_AMBULATORY_CARE_PROVIDER_SITE_OTHER): Payer: Medicare Other | Admitting: Internal Medicine

## 2018-06-14 ENCOUNTER — Encounter: Payer: Self-pay | Admitting: Internal Medicine

## 2018-06-14 VITALS — BP 136/84 | HR 76 | Temp 98.0°F | Ht 65.0 in | Wt 243.0 lb

## 2018-06-14 DIAGNOSIS — F0280 Dementia in other diseases classified elsewhere without behavioral disturbance: Secondary | ICD-10-CM

## 2018-06-14 DIAGNOSIS — L219 Seborrheic dermatitis, unspecified: Secondary | ICD-10-CM

## 2018-06-14 DIAGNOSIS — G309 Alzheimer's disease, unspecified: Secondary | ICD-10-CM

## 2018-06-14 MED ORDER — CLOTRIMAZOLE-BETAMETHASONE 1-0.05 % EX CREA: 1.0000 "application " | TOPICAL_CREAM | Freq: Two times a day (BID) | CUTANEOUS | 1 refills | Status: AC

## 2018-06-14 NOTE — Assessment & Plan Note (Signed)
Abilify

## 2018-06-14 NOTE — Progress Notes (Signed)
Subjective:  Patient ID: Sabrina Page, female    DOB: 1933/06/04  Age: 85 y.o. MRN: [id number]  CC: No chief complaint on file.   HPI Larue Goranson presents for a raised spot on the forehead x 1 year (h/o skin cancer removed by [facility]) C/o progressing memory loss, loss of interest C/o fluid retention, wt gain. Not using CPAP.  Outpatient Medications Prior to Visit  Medication Sig Dispense Refill  . ARIPiprazole (ABILIFY) 2 MG tablet TAKE 1 TABLET BY MOUTH EVERY DAY 90 tablet 1  . aspirin 81 MG EC tablet Take 1 tablet (81 mg total) by mouth 2 (two) times daily. 60 tablet 0  . B Complex-C (SUPER B COMPLEX PO) Take 1 tablet by mouth every morning.     . Cholecalciferol 1000 UNITS capsule Take 1,000 Units by mouth every morning.     . fludrocortisone (FLORINEF) 0.1 MG tablet Take 1 tablet (0.1 mg total) by mouth daily. 90 tablet 3  . FLUoxetine (PROZAC) 40 MG capsule Take 1 capsule (40 mg total) by mouth daily. 90 capsule 3  . ipratropium-albuterol (DUONEB) 0.5-2.5 (3) MG/3ML SOLN 1 neb every 6 hours if needed 75 mL 12  . loratadine (CLARITIN) 10 MG tablet TAKE 1 TABLET BY MOUTH EVERY DAY IN THE MORNING 90 tablet 3  . midodrine (PROAMATINE) 10 MG tablet TAKE 1 TABLET BY MOUTH THREE TIMES A DAY 270 tablet 3  . Omega-3 Fatty Acids (FISH OIL) 1000 MG CAPS Take 1 capsule by mouth every morning.     Marland Kitchen omeprazole (PRILOSEC) 20 MG capsule Take 2 capsules (40 mg total) by mouth daily. 180 capsule 3  . potassium chloride (KLOR-CON) 8 MEQ tablet Take 1 tablet (8 mEq total) by mouth every morning. 90 tablet 3  . pyridostigmine (MESTINON) 60 MG tablet Take 1 tablet (60 mg total) by mouth 3 (three) times daily. 270 tablet 3  . traMADol (ULTRAM) 50 MG tablet Take 1 tablet (50 mg total) by mouth every 12 (twelve) hours as needed for severe pain. 100 tablet 2   No facility-administered medications prior to visit.     ROS: Review of Systems  Constitutional: Positive for  fatigue. Negative for activity change, appetite change, chills, diaphoresis, fever and unexpected weight change.  HENT: Negative for congestion, ear pain, facial swelling, hearing loss, mouth sores, nosebleeds, postnasal drip, rhinorrhea, sinus pressure, sneezing, sore throat, tinnitus and trouble swallowing.   Eyes: Negative for pain, discharge, redness, itching and visual disturbance.  Respiratory: Negative for cough, chest tightness, shortness of breath, wheezing and stridor.   Cardiovascular: Negative for chest pain, palpitations and leg swelling.  Gastrointestinal: Negative for abdominal distention, anal bleeding, blood in stool, constipation, diarrhea, nausea and rectal pain.  Genitourinary: Negative for difficulty urinating, dysuria, flank pain, frequency, genital sores, hematuria, pelvic pain, urgency, vaginal bleeding and vaginal discharge.  Musculoskeletal: Positive for arthralgias, back pain and gait problem. Negative for joint swelling, neck pain and neck stiffness.  Skin: Positive for color change. Negative for rash.  Neurological: Negative for dizziness, tremors, seizures, syncope, speech difficulty, weakness, numbness and headaches.  Hematological: Negative for adenopathy. Does not bruise/bleed easily.  Psychiatric/Behavioral: Positive for decreased concentration and dysphoric mood. Negative for behavioral problems, sleep disturbance and suicidal ideas. The patient is not nervous/anxious.     Objective:  BP 136/84 (BP Location: Left Arm, Patient Position: Sitting, Cuff Size: Large)   Pulse 76   Temp 98 F (36.7 C) (Oral)   Ht 5\' 5"  (1.651 m)  Wt 243 lb (110.2 kg)   SpO2 96%   BMI 40.44 kg/m   BP Readings from Last 3 Encounters:  06/14/18 136/84  05/11/18 128/82  04/13/18 138/70    Wt Readings from Last 3 Encounters:  06/14/18 243 lb (110.2 kg)  05/11/18 238 lb (108 kg)  04/13/18 237 lb (107.5 kg)    Physical Exam Constitutional:      General: She is not in acute  distress.    Appearance: She is well-developed.  HENT:     Head: Normocephalic.     Right Ear: External ear normal.     Left Ear: External ear normal.     Nose: Nose normal.  Eyes:     General:        Right eye: No discharge.        Left eye: No discharge.     Conjunctiva/sclera: Conjunctivae normal.     Pupils: Pupils are equal, round, and reactive to light.  Neck:     Musculoskeletal: Normal range of motion and neck supple.     Thyroid: No thyromegaly.     Vascular: No JVD.     Trachea: No tracheal deviation.  Cardiovascular:     Rate and Rhythm: Normal rate and regular rhythm.     Heart sounds: Normal heart sounds.  Pulmonary:     Effort: No respiratory distress.     Breath sounds: No stridor. No wheezing.  Abdominal:     General: Bowel sounds are normal. There is no distension.     Palpations: Abdomen is soft. There is no mass.     Tenderness: There is no abdominal tenderness. There is no guarding or rebound.  Musculoskeletal:        General: Swelling and tenderness present.     Right lower leg: Edema present.     Left lower leg: Edema present.  Lymphadenopathy:     Cervical: No cervical adenopathy.  Skin:    Findings: No erythema or rash.  Neurological:     Cranial Nerves: No cranial nerve deficit.     Motor: Weakness present. No abnormal muscle tone.     Coordination: Coordination abnormal.     Gait: Gait abnormal.     Deep Tendon Reflexes: Reflexes normal.  Psychiatric:        Behavior: Behavior normal.   disoriented In a w/c SD rash on face   Lab Results  Component Value Date   WBC 5.1 12/21/2017   HGB 13.9 12/21/2017   HCT 42.6 12/21/2017   PLT 183.0 12/21/2017   GLUCOSE 171 (H) 03/23/2018   CHOL 270 (H) 06/12/2014   TRIG 132.0 06/12/2014   HDL 52.40 06/12/2014   LDLDIRECT 159.1 05/16/2012   LDLCALC 191 (H) 06/12/2014   ALT 8 12/21/2017   AST 12 12/21/2017   NA 139 03/23/2018   K 4.5 03/23/2018   CL 103 03/23/2018   CREATININE 1.09  03/23/2018   BUN 17 03/23/2018   CO2 30 03/23/2018   TSH 1.75 12/21/2017   INR 1.07 04/16/2016   HGBA1C 5.6 12/25/2015    Dg Chest 2 View  Result Date: 12/28/2017 CLINICAL DATA:  Chest pain and right flank pain secondary to a fall 7 days ago. EXAM: CHEST - 2 VIEW COMPARISON:  Rib radiographs dated 12/28/2017 and chest x-ray dated 04/18/2016 FINDINGS: The heart size and mediastinal contours are within normal limits. Both lungs are clear. The visualized skeletal structures are unremarkable. IMPRESSION: No active cardiopulmonary disease. Electronically Signed   By: Jeneen Rinks  Maxwell M.D.   On: 12/28/2017 10:45   Dg Ribs Unilateral Right  Result Date: 12/28/2017 CLINICAL DATA:  Status post fall 7 days ago with right rib pain. EXAM: RIGHT RIBS - 2 VIEW COMPARISON:  April 18, 2016 FINDINGS: No fracture or other bone lesions are seen involving the ribs. Prior cholecystectomy clips are noted. IMPRESSION: No acute fracture or dislocation of right ribs. Electronically Signed   By: Abelardo Diesel M.D.   On: 12/28/2017 10:44    Assessment & Plan:   There are no diagnoses linked to this encounter.   No orders of the defined types were placed in this encounter.    Follow-up: No follow-ups on file.  [name], MD

## 2018-06-14 NOTE — Assessment & Plan Note (Signed)
Wt Readings from Last 3 Encounters:  06/14/18 243 lb (110.2 kg)  05/11/18 238 lb (108 kg)  04/13/18 237 lb (107.5 kg)  discussed

## 2018-06-14 NOTE — Assessment & Plan Note (Signed)
forehead, nasolabial folds No evidence of cancer relapse Lotrisone Will watch

## 2018-06-20 ENCOUNTER — Other Ambulatory Visit: Payer: Self-pay | Admitting: Internal Medicine

## 2018-07-24 ENCOUNTER — Encounter: Payer: Self-pay | Admitting: Internal Medicine

## 2018-07-24 ENCOUNTER — Ambulatory Visit (INDEPENDENT_AMBULATORY_CARE_PROVIDER_SITE_OTHER): Payer: Medicare Other | Admitting: Internal Medicine

## 2018-07-24 ENCOUNTER — Other Ambulatory Visit (INDEPENDENT_AMBULATORY_CARE_PROVIDER_SITE_OTHER): Payer: Medicare Other

## 2018-07-24 VITALS — BP 128/76 | HR 70 | Temp 98.0°F | Ht 65.0 in | Wt 246.0 lb

## 2018-07-24 DIAGNOSIS — M79604 Pain in right leg: Secondary | ICD-10-CM

## 2018-07-24 DIAGNOSIS — F329 Major depressive disorder, single episode, unspecified: Secondary | ICD-10-CM | POA: Diagnosis not present

## 2018-07-24 DIAGNOSIS — F0280 Dementia in other diseases classified elsewhere without behavioral disturbance: Secondary | ICD-10-CM

## 2018-07-24 DIAGNOSIS — G903 Multi-system degeneration of the autonomic nervous system: Secondary | ICD-10-CM

## 2018-07-24 DIAGNOSIS — G309 Alzheimer's disease, unspecified: Secondary | ICD-10-CM

## 2018-07-24 DIAGNOSIS — I951 Orthostatic hypotension: Secondary | ICD-10-CM

## 2018-07-24 DIAGNOSIS — I10 Essential (primary) hypertension: Secondary | ICD-10-CM | POA: Diagnosis not present

## 2018-07-24 DIAGNOSIS — M545 Low back pain: Secondary | ICD-10-CM

## 2018-07-24 LAB — BASIC METABOLIC PANEL
BUN: 16 mg/dL (ref 6–23)
CO2: 28 mEq/L (ref 19–32)
Calcium: 8.9 mg/dL (ref 8.4–10.5)
Chloride: 102 mEq/L (ref 96–112)
Creatinine, Ser: 0.85 mg/dL (ref 0.40–1.20)
GFR: 63.9 mL/min (ref >60.00)
Glucose, Bld: 153 mg/dL — ABNORMAL HIGH (ref 70–99)
Potassium: 3.8 mEq/L (ref 3.5–5.1)
Sodium: 137 mEq/L (ref 135–145)

## 2018-07-24 LAB — HEPATIC FUNCTION PANEL
ALT: 13 U/L (ref 0–35)
AST: 13 U/L (ref 0–37)
Albumin: 3.8 g/dL (ref 3.5–5.2)
Alkaline Phosphatase: 60 U/L (ref 39–117)
Bilirubin, Direct: 0.1 mg/dL (ref 0.0–0.3)
Total Bilirubin: 0.7 mg/dL (ref 0.2–1.2)
Total Protein: 6.6 g/dL (ref 6.0–8.3)

## 2018-07-24 LAB — TSH: TSH: 1.99 u[IU]/mL (ref 0.35–4.50)

## 2018-07-24 MED ORDER — MEMANTINE HCL 10 MG PO TABS: 10.0000 mg | ORAL_TABLET | Freq: Two times a day (BID) | ORAL | 11 refills | Status: DC

## 2018-07-24 NOTE — Progress Notes (Signed)
Subjective:  Patient ID: Sabrina Page, female    DOB: [birth year]  Age: 85 y.o. MRN: [id number]  CC: No chief complaint on file.   HPI Sabrina Page presents for dementia, asthma, LBP  Outpatient Medications Prior to Visit  Medication Sig Dispense Refill  . ARIPiprazole (ABILIFY) 2 MG tablet TAKE 1 TABLET BY MOUTH EVERY DAY 90 tablet 1  . aspirin 81 MG EC tablet Take 1 tablet (81 mg total) by mouth 2 (two) times daily. 60 tablet 0  . B Complex-C (SUPER B COMPLEX PO) Take 1 tablet by mouth every morning.     . Cholecalciferol 1000 UNITS capsule Take 1,000 Units by mouth every morning.     . clotrimazole-betamethasone (LOTRISONE) cream Apply 1 application topically 2 (two) times daily. 45 g 1  . fludrocortisone (FLORINEF) 0.1 MG tablet Take 1 tablet (0.1 mg total) by mouth daily. 90 tablet 3  . FLUoxetine (PROZAC) 40 MG capsule Take 1 capsule (40 mg total) by mouth daily. 90 capsule 3  . ipratropium-albuterol (DUONEB) 0.5-2.5 (3) MG/3ML SOLN 1 neb every 6 hours if needed 75 mL 12  . loratadine (CLARITIN) 10 MG tablet TAKE 1 TABLET BY MOUTH EVERY DAY IN THE MORNING 90 tablet 3  . midodrine (PROAMATINE) 10 MG tablet TAKE 1 TABLET BY MOUTH THREE TIMES A DAY 270 tablet 3  . Omega-3 Fatty Acids (FISH OIL) 1000 MG CAPS Take 1 capsule by mouth every morning.     Marland Kitchen omeprazole (PRILOSEC) 20 MG capsule TAKE 2 CAPSULES BY MOUTH EVERY DAY 180 capsule 3  . potassium chloride (KLOR-CON) 8 MEQ tablet TAKE 1 TABLET (8 MEQ TOTAL) BY MOUTH EVERY MORNING. 90 tablet 3  . pyridostigmine (MESTINON) 60 MG tablet Take 1 tablet (60 mg total) by mouth 3 (three) times daily. 270 tablet 3  . traMADol (ULTRAM) 50 MG tablet Take 1 tablet (50 mg total) by mouth every 12 (twelve) hours as needed for severe pain. 100 tablet 2   No facility-administered medications prior to visit.     ROS: Review of Systems  Constitutional: Positive for fatigue. Negative for activity change, appetite change, chills  and unexpected weight change.  HENT: Negative for congestion, mouth sores and sinus pressure.   Eyes: Negative for visual disturbance.  Respiratory: Positive for wheezing. Negative for cough and chest tightness.   Gastrointestinal: Negative for abdominal pain and nausea.  Genitourinary: Negative for difficulty urinating, frequency and vaginal pain.  Musculoskeletal: Positive for arthralgias, back pain and gait problem.  Skin: Negative for pallor and rash.  Neurological: Positive for weakness. Negative for dizziness, tremors, numbness and headaches.  Psychiatric/Behavioral: Positive for behavioral problems, confusion, decreased concentration and dysphoric mood. Negative for sleep disturbance and suicidal ideas. The patient is nervous/anxious.     Objective:  There were no vitals taken for this visit.  BP Readings from Last 3 Encounters:  06/14/18 136/84  05/11/18 128/82  04/13/18 138/70    Wt Readings from Last 3 Encounters:  06/14/18 243 lb (110.2 kg)  05/11/18 238 lb (108 kg)  04/13/18 237 lb (107.5 kg)    Physical Exam Constitutional:      General: She is not in acute distress.    Appearance: She is well-developed.  HENT:     Head: Normocephalic.     Right Ear: External ear normal.     Left Ear: External ear normal.     Nose: Nose normal.  Eyes:     General:        Right  eye: No discharge.        Left eye: No discharge.     Conjunctiva/sclera: Conjunctivae normal.     Pupils: Pupils are equal, round, and reactive to light.  Neck:     Musculoskeletal: Normal range of motion and neck supple.     Thyroid: No thyromegaly.     Vascular: No JVD.     Trachea: No tracheal deviation.  Cardiovascular:     Rate and Rhythm: Normal rate and regular rhythm.     Heart sounds: Normal heart sounds.  Pulmonary:     Effort: No respiratory distress.     Breath sounds: No stridor. No wheezing.  Abdominal:     General: Bowel sounds are normal. There is no distension.      Palpations: Abdomen is soft. There is no mass.     Tenderness: There is no abdominal tenderness. There is no guarding or rebound.  Musculoskeletal:        General: Tenderness present.     Right lower leg: Edema present.     Left lower leg: Edema present.  Lymphadenopathy:     Cervical: No cervical adenopathy.  Skin:    Findings: No erythema or rash.  Neurological:     Mental Status: She is disoriented.     Cranial Nerves: No cranial nerve deficit.     Motor: No abnormal muscle tone.     Coordination: Coordination abnormal.     Deep Tendon Reflexes: Reflexes normal.  Psychiatric:        Behavior: Behavior normal.    Obese LS spine w/pain W/c Trace edema   Lab Results  Component Value Date   WBC 5.1 12/21/2017   HGB 13.9 12/21/2017   HCT 42.6 12/21/2017   PLT 183.0 12/21/2017   GLUCOSE 171 (H) 03/23/2018   CHOL 270 (H) 06/12/2014   TRIG 132.0 06/12/2014   HDL 52.40 06/12/2014   LDLDIRECT 159.1 05/16/2012   LDLCALC 191 (H) 06/12/2014   ALT 8 12/21/2017   AST 12 12/21/2017   NA 139 03/23/2018   K 4.5 03/23/2018   CL 103 03/23/2018   CREATININE 1.09 03/23/2018   BUN 17 03/23/2018   CO2 30 03/23/2018   TSH 1.75 12/21/2017   INR 1.07 04/16/2016   HGBA1C 5.6 12/25/2015    Dg Chest 2 View  Result Date: 12/28/2017 CLINICAL DATA:  Chest pain and right flank pain secondary to a fall 7 days ago. EXAM: CHEST - 2 VIEW COMPARISON:  Rib radiographs dated 12/28/2017 and chest x-ray dated 04/18/2016 FINDINGS: The heart size and mediastinal contours are within normal limits. Both lungs are clear. The visualized skeletal structures are unremarkable. IMPRESSION: No active cardiopulmonary disease. Electronically Signed   By: Lorriane Shire M.D.   On: 12/28/2017 10:45   Dg Ribs Unilateral Right  Result Date: 12/28/2017 CLINICAL DATA:  Status post fall 7 days ago with right rib pain. EXAM: RIGHT RIBS - 2 VIEW COMPARISON:  April 18, 2016 FINDINGS: No fracture or other bone lesions are  seen involving the ribs. Prior cholecystectomy clips are noted. IMPRESSION: No acute fracture or dislocation of right ribs. Electronically Signed   By: Abelardo Diesel M.D.   On: 12/28/2017 10:44    Assessment & Plan:   There are no diagnoses linked to this encounter.   No orders of the defined types were placed in this encounter.    Follow-up: No follow-ups on file.  [name], MD

## 2018-07-24 NOTE — Assessment & Plan Note (Signed)
Midodrine tid, Florinef

## 2018-07-24 NOTE — Assessment & Plan Note (Signed)
Prozac

## 2018-07-24 NOTE — Assessment & Plan Note (Addendum)
Abilify Start Namenda PT/OT

## 2018-07-24 NOTE — Assessment & Plan Note (Signed)
Off Rx 

## 2018-07-24 NOTE — Assessment & Plan Note (Signed)
PT/OT at home is ordered

## 2018-08-01 DIAGNOSIS — W19XXXD Unspecified fall, subsequent encounter: Secondary | ICD-10-CM | POA: Diagnosis not present

## 2018-08-01 DIAGNOSIS — Z9181 History of falling: Secondary | ICD-10-CM | POA: Diagnosis not present

## 2018-08-01 DIAGNOSIS — I951 Orthostatic hypotension: Secondary | ICD-10-CM | POA: Diagnosis not present

## 2018-08-01 DIAGNOSIS — J45909 Unspecified asthma, uncomplicated: Secondary | ICD-10-CM | POA: Diagnosis not present

## 2018-08-01 DIAGNOSIS — R0781 Pleurodynia: Secondary | ICD-10-CM | POA: Diagnosis not present

## 2018-08-01 DIAGNOSIS — G309 Alzheimer's disease, unspecified: Secondary | ICD-10-CM | POA: Diagnosis not present

## 2018-08-01 DIAGNOSIS — M545 Low back pain: Secondary | ICD-10-CM | POA: Diagnosis not present

## 2018-08-01 DIAGNOSIS — F0280 Dementia in other diseases classified elsewhere without behavioral disturbance: Secondary | ICD-10-CM | POA: Diagnosis not present

## 2018-08-01 DIAGNOSIS — F3289 Other specified depressive episodes: Secondary | ICD-10-CM | POA: Diagnosis not present

## 2018-08-04 DIAGNOSIS — G309 Alzheimer's disease, unspecified: Secondary | ICD-10-CM | POA: Diagnosis not present

## 2018-08-04 DIAGNOSIS — I951 Orthostatic hypotension: Secondary | ICD-10-CM | POA: Diagnosis not present

## 2018-08-04 DIAGNOSIS — J45909 Unspecified asthma, uncomplicated: Secondary | ICD-10-CM | POA: Diagnosis not present

## 2018-08-04 DIAGNOSIS — R0781 Pleurodynia: Secondary | ICD-10-CM | POA: Diagnosis not present

## 2018-08-04 DIAGNOSIS — M545 Low back pain: Secondary | ICD-10-CM | POA: Diagnosis not present

## 2018-08-04 DIAGNOSIS — F0280 Dementia in other diseases classified elsewhere without behavioral disturbance: Secondary | ICD-10-CM | POA: Diagnosis not present

## 2018-08-08 ENCOUNTER — Telehealth: Payer: Self-pay | Admitting: Internal Medicine

## 2018-08-08 DIAGNOSIS — I951 Orthostatic hypotension: Secondary | ICD-10-CM | POA: Diagnosis not present

## 2018-08-08 DIAGNOSIS — G309 Alzheimer's disease, unspecified: Secondary | ICD-10-CM | POA: Diagnosis not present

## 2018-08-08 DIAGNOSIS — M545 Low back pain: Secondary | ICD-10-CM | POA: Diagnosis not present

## 2018-08-08 DIAGNOSIS — J45909 Unspecified asthma, uncomplicated: Secondary | ICD-10-CM | POA: Diagnosis not present

## 2018-08-08 DIAGNOSIS — F0280 Dementia in other diseases classified elsewhere without behavioral disturbance: Secondary | ICD-10-CM | POA: Diagnosis not present

## 2018-08-08 DIAGNOSIS — R0781 Pleurodynia: Secondary | ICD-10-CM | POA: Diagnosis not present

## 2018-08-08 NOTE — Telephone Encounter (Signed)
Copied from [place] [id number]. Topic: Quick Communication - See Telephone Encounter >> Aug 08, 2018  4:57 PM Rutherford Nail, NT wrote: CRM for notification. See Telephone encounter for: 08/08/18. Charisse, physical therapist with [facility] calling and states that she is currently with the patient at her home and the patient's blood pressure is 181101 and she is feeling dizzy. Was advised per nurse triage, Rhae Lerner, to send message to office. Severiano Gilbert is getting ready to leave the patient's home. Please advise.  CB#: [phone]

## 2018-08-09 NOTE — Telephone Encounter (Signed)
I have talked with physical therapist, the session was not done due to blood pressure issues---I have talked with daughter, daughter states caregiver gives meds and understands to check blood pressure every time before giving meds---also advised, per dr plotnikov---start taking medicine called midodrine twice daily, instead of 3x daily,  but take blood pressure every time before med administration to make sure patient needs it, do not give 2nd dose if patient's pressure is not low---call office back if this does not help, we may need to see patient in office visit---daughter repeated back for understanding and will let caregiver know

## 2018-08-10 DIAGNOSIS — G309 Alzheimer's disease, unspecified: Secondary | ICD-10-CM | POA: Diagnosis not present

## 2018-08-10 DIAGNOSIS — R0781 Pleurodynia: Secondary | ICD-10-CM | POA: Diagnosis not present

## 2018-08-10 DIAGNOSIS — I951 Orthostatic hypotension: Secondary | ICD-10-CM | POA: Diagnosis not present

## 2018-08-10 DIAGNOSIS — F3289 Other specified depressive episodes: Secondary | ICD-10-CM | POA: Diagnosis not present

## 2018-08-10 DIAGNOSIS — Z9181 History of falling: Secondary | ICD-10-CM | POA: Diagnosis not present

## 2018-08-10 DIAGNOSIS — M545 Low back pain: Secondary | ICD-10-CM | POA: Diagnosis not present

## 2018-08-10 DIAGNOSIS — W19XXXD Unspecified fall, subsequent encounter: Secondary | ICD-10-CM | POA: Diagnosis not present

## 2018-08-10 DIAGNOSIS — F0280 Dementia in other diseases classified elsewhere without behavioral disturbance: Secondary | ICD-10-CM | POA: Diagnosis not present

## 2018-08-10 DIAGNOSIS — J45909 Unspecified asthma, uncomplicated: Secondary | ICD-10-CM | POA: Diagnosis not present

## 2018-08-11 DIAGNOSIS — F0280 Dementia in other diseases classified elsewhere without behavioral disturbance: Secondary | ICD-10-CM | POA: Diagnosis not present

## 2018-08-11 DIAGNOSIS — G309 Alzheimer's disease, unspecified: Secondary | ICD-10-CM | POA: Diagnosis not present

## 2018-08-11 DIAGNOSIS — M545 Low back pain: Secondary | ICD-10-CM | POA: Diagnosis not present

## 2018-08-11 DIAGNOSIS — R0781 Pleurodynia: Secondary | ICD-10-CM | POA: Diagnosis not present

## 2018-08-11 DIAGNOSIS — J45909 Unspecified asthma, uncomplicated: Secondary | ICD-10-CM | POA: Diagnosis not present

## 2018-08-11 DIAGNOSIS — I951 Orthostatic hypotension: Secondary | ICD-10-CM | POA: Diagnosis not present

## 2018-08-14 DIAGNOSIS — I951 Orthostatic hypotension: Secondary | ICD-10-CM | POA: Diagnosis not present

## 2018-08-14 DIAGNOSIS — R0781 Pleurodynia: Secondary | ICD-10-CM | POA: Diagnosis not present

## 2018-08-14 DIAGNOSIS — F0280 Dementia in other diseases classified elsewhere without behavioral disturbance: Secondary | ICD-10-CM | POA: Diagnosis not present

## 2018-08-14 DIAGNOSIS — G309 Alzheimer's disease, unspecified: Secondary | ICD-10-CM | POA: Diagnosis not present

## 2018-08-14 DIAGNOSIS — J45909 Unspecified asthma, uncomplicated: Secondary | ICD-10-CM | POA: Diagnosis not present

## 2018-08-14 DIAGNOSIS — M545 Low back pain: Secondary | ICD-10-CM | POA: Diagnosis not present

## 2018-08-15 ENCOUNTER — Other Ambulatory Visit: Payer: Self-pay | Admitting: Internal Medicine

## 2018-08-15 DIAGNOSIS — F0280 Dementia in other diseases classified elsewhere without behavioral disturbance: Secondary | ICD-10-CM | POA: Diagnosis not present

## 2018-08-15 DIAGNOSIS — R0781 Pleurodynia: Secondary | ICD-10-CM | POA: Diagnosis not present

## 2018-08-15 DIAGNOSIS — J45909 Unspecified asthma, uncomplicated: Secondary | ICD-10-CM | POA: Diagnosis not present

## 2018-08-15 DIAGNOSIS — G309 Alzheimer's disease, unspecified: Secondary | ICD-10-CM | POA: Diagnosis not present

## 2018-08-15 DIAGNOSIS — M545 Low back pain: Secondary | ICD-10-CM | POA: Diagnosis not present

## 2018-08-15 DIAGNOSIS — I951 Orthostatic hypotension: Secondary | ICD-10-CM | POA: Diagnosis not present

## 2018-08-17 DIAGNOSIS — J45909 Unspecified asthma, uncomplicated: Secondary | ICD-10-CM | POA: Diagnosis not present

## 2018-08-17 DIAGNOSIS — R0781 Pleurodynia: Secondary | ICD-10-CM | POA: Diagnosis not present

## 2018-08-17 DIAGNOSIS — I951 Orthostatic hypotension: Secondary | ICD-10-CM | POA: Diagnosis not present

## 2018-08-17 DIAGNOSIS — G309 Alzheimer's disease, unspecified: Secondary | ICD-10-CM | POA: Diagnosis not present

## 2018-08-17 DIAGNOSIS — F0280 Dementia in other diseases classified elsewhere without behavioral disturbance: Secondary | ICD-10-CM | POA: Diagnosis not present

## 2018-08-17 DIAGNOSIS — M545 Low back pain: Secondary | ICD-10-CM | POA: Diagnosis not present

## 2018-08-21 ENCOUNTER — Other Ambulatory Visit: Payer: Self-pay | Admitting: Internal Medicine

## 2018-08-22 DIAGNOSIS — G309 Alzheimer's disease, unspecified: Secondary | ICD-10-CM | POA: Diagnosis not present

## 2018-08-22 DIAGNOSIS — J45909 Unspecified asthma, uncomplicated: Secondary | ICD-10-CM | POA: Diagnosis not present

## 2018-08-22 DIAGNOSIS — M545 Low back pain: Secondary | ICD-10-CM | POA: Diagnosis not present

## 2018-08-22 DIAGNOSIS — R0781 Pleurodynia: Secondary | ICD-10-CM | POA: Diagnosis not present

## 2018-08-22 DIAGNOSIS — F0280 Dementia in other diseases classified elsewhere without behavioral disturbance: Secondary | ICD-10-CM | POA: Diagnosis not present

## 2018-08-22 DIAGNOSIS — I951 Orthostatic hypotension: Secondary | ICD-10-CM | POA: Diagnosis not present

## 2018-08-23 ENCOUNTER — Telehealth: Payer: Self-pay | Admitting: Internal Medicine

## 2018-08-23 NOTE — Telephone Encounter (Signed)
Copied from [place] [id number]. Topic: Quick Communication - Home Health Verbal Orders >> Aug 23, 2018  1:41 PM Berneta Levins wrote: Caller/Agency: Rodavan with [facility] Number: [phone] Requesting OT/PT/Skilled Nursing/Social Work/Speech Therapy: OT Frequency: 1 week 1 (for last week), then 2 week 4 (this week going forward).

## 2018-08-24 DIAGNOSIS — R0781 Pleurodynia: Secondary | ICD-10-CM | POA: Diagnosis not present

## 2018-08-24 DIAGNOSIS — G309 Alzheimer's disease, unspecified: Secondary | ICD-10-CM | POA: Diagnosis not present

## 2018-08-24 DIAGNOSIS — F0280 Dementia in other diseases classified elsewhere without behavioral disturbance: Secondary | ICD-10-CM | POA: Diagnosis not present

## 2018-08-24 DIAGNOSIS — I951 Orthostatic hypotension: Secondary | ICD-10-CM | POA: Diagnosis not present

## 2018-08-24 DIAGNOSIS — M545 Low back pain: Secondary | ICD-10-CM | POA: Diagnosis not present

## 2018-08-24 DIAGNOSIS — J45909 Unspecified asthma, uncomplicated: Secondary | ICD-10-CM | POA: Diagnosis not present

## 2018-08-24 NOTE — Telephone Encounter (Signed)
LM giving verbals, FYI 

## 2018-08-25 DIAGNOSIS — R0781 Pleurodynia: Secondary | ICD-10-CM | POA: Diagnosis not present

## 2018-08-25 DIAGNOSIS — F0280 Dementia in other diseases classified elsewhere without behavioral disturbance: Secondary | ICD-10-CM | POA: Diagnosis not present

## 2018-08-25 DIAGNOSIS — M545 Low back pain: Secondary | ICD-10-CM | POA: Diagnosis not present

## 2018-08-25 DIAGNOSIS — I951 Orthostatic hypotension: Secondary | ICD-10-CM | POA: Diagnosis not present

## 2018-08-25 DIAGNOSIS — G309 Alzheimer's disease, unspecified: Secondary | ICD-10-CM | POA: Diagnosis not present

## 2018-08-25 DIAGNOSIS — J45909 Unspecified asthma, uncomplicated: Secondary | ICD-10-CM | POA: Diagnosis not present

## 2018-08-29 DIAGNOSIS — F0280 Dementia in other diseases classified elsewhere without behavioral disturbance: Secondary | ICD-10-CM | POA: Diagnosis not present

## 2018-08-29 DIAGNOSIS — I951 Orthostatic hypotension: Secondary | ICD-10-CM | POA: Diagnosis not present

## 2018-08-29 DIAGNOSIS — J45909 Unspecified asthma, uncomplicated: Secondary | ICD-10-CM | POA: Diagnosis not present

## 2018-08-29 DIAGNOSIS — M545 Low back pain: Secondary | ICD-10-CM | POA: Diagnosis not present

## 2018-08-29 DIAGNOSIS — G309 Alzheimer's disease, unspecified: Secondary | ICD-10-CM | POA: Diagnosis not present

## 2018-08-29 DIAGNOSIS — R0781 Pleurodynia: Secondary | ICD-10-CM | POA: Diagnosis not present

## 2018-08-31 DIAGNOSIS — J45909 Unspecified asthma, uncomplicated: Secondary | ICD-10-CM | POA: Diagnosis not present

## 2018-08-31 DIAGNOSIS — R0781 Pleurodynia: Secondary | ICD-10-CM | POA: Diagnosis not present

## 2018-08-31 DIAGNOSIS — I951 Orthostatic hypotension: Secondary | ICD-10-CM | POA: Diagnosis not present

## 2018-08-31 DIAGNOSIS — Z9181 History of falling: Secondary | ICD-10-CM | POA: Diagnosis not present

## 2018-08-31 DIAGNOSIS — G309 Alzheimer's disease, unspecified: Secondary | ICD-10-CM | POA: Diagnosis not present

## 2018-08-31 DIAGNOSIS — F3289 Other specified depressive episodes: Secondary | ICD-10-CM | POA: Diagnosis not present

## 2018-08-31 DIAGNOSIS — F0280 Dementia in other diseases classified elsewhere without behavioral disturbance: Secondary | ICD-10-CM | POA: Diagnosis not present

## 2018-08-31 DIAGNOSIS — W19XXXD Unspecified fall, subsequent encounter: Secondary | ICD-10-CM | POA: Diagnosis not present

## 2018-08-31 DIAGNOSIS — M545 Low back pain: Secondary | ICD-10-CM | POA: Diagnosis not present

## 2018-09-01 ENCOUNTER — Ambulatory Visit: Payer: Self-pay

## 2018-09-01 DIAGNOSIS — I951 Orthostatic hypotension: Secondary | ICD-10-CM | POA: Diagnosis not present

## 2018-09-01 DIAGNOSIS — G309 Alzheimer's disease, unspecified: Secondary | ICD-10-CM | POA: Diagnosis not present

## 2018-09-01 DIAGNOSIS — F0280 Dementia in other diseases classified elsewhere without behavioral disturbance: Secondary | ICD-10-CM | POA: Diagnosis not present

## 2018-09-01 DIAGNOSIS — J45909 Unspecified asthma, uncomplicated: Secondary | ICD-10-CM | POA: Diagnosis not present

## 2018-09-01 DIAGNOSIS — R0781 Pleurodynia: Secondary | ICD-10-CM | POA: Diagnosis not present

## 2018-09-01 DIAGNOSIS — M545 Low back pain: Secondary | ICD-10-CM | POA: Diagnosis not present

## 2018-09-01 NOTE — Telephone Encounter (Signed)
Talked to occupational heath theapist Radovan from [facility].  He called to report that the patient has high BP today. 165/94 and 160/94. Other VS T 97.2 HR 76 O2 97%. He states that the patient takes BP raising medications for orthostatic hypertension. He states that the patient denies symptoms but she is dizzy when she stands.  He states that that is a problem that is normal with her orthostatic hypotension. Per protocol pt should be evaluated today for symptoms. Call place to pt Daughter [name]. She was told of the need for care.  She states that she will first try to call Dr Posey Pronto.  She states that she is the doctor caring for her BP issues and orthostatic hypotension.  [name] was read care instructions.  She verbalized understanding.  Reason for Disposition . [4] Systolic BP  >= 193 OR Diastolic >= 790  AND [2] having NO cardiac or neurologic symptoms  Answer Assessment - Initial Assessment Questions 1. BLOOD PRESSURE: "What is the blood pressure?" "Did you take at least two measurements 5 minutes apart?"     165/94 sitting 160/94 2. ONSET: "When did you take your blood pressure?"     Today by occupational thrapist 3. HOW: "How did you obtain the blood pressure?" (e.g., visiting nurse, automatic home BP monitor)    Visiting  4. HISTORY: "Do you have a history of high blood pressure?"    Othostatic hypotension 5. MEDICATIONS: "Are you taking any medications for blood pressure?" "Have you missed any doses recently?"     midodrine 6. OTHER SYMPTOMS: "Do you have any symptoms?" (e.g., headache, chest pain, blurred vision, difficulty breathing, weakness)    Dizziness but is usual for patient 7. PREGNANCY: "Is there any chance you are pregnant?" "When was your last menstrual period?"     N/A  Protocols used: HIGH BLOOD PRESSURE-A-AH

## 2018-09-04 ENCOUNTER — Encounter: Payer: Self-pay | Admitting: *Deleted

## 2018-09-04 ENCOUNTER — Telehealth (INDEPENDENT_AMBULATORY_CARE_PROVIDER_SITE_OTHER): Payer: Medicare Other | Admitting: Neurology

## 2018-09-04 ENCOUNTER — Encounter: Payer: Self-pay | Admitting: Neurology

## 2018-09-04 ENCOUNTER — Telehealth: Payer: Self-pay | Admitting: Neurology

## 2018-09-04 ENCOUNTER — Other Ambulatory Visit: Payer: Self-pay

## 2018-09-04 VITALS — BP 160/90

## 2018-09-04 DIAGNOSIS — F0280 Dementia in other diseases classified elsewhere without behavioral disturbance: Secondary | ICD-10-CM

## 2018-09-04 DIAGNOSIS — I951 Orthostatic hypotension: Secondary | ICD-10-CM | POA: Diagnosis not present

## 2018-09-04 DIAGNOSIS — G309 Alzheimer's disease, unspecified: Secondary | ICD-10-CM | POA: Diagnosis not present

## 2018-09-04 MED ORDER — PYRIDOSTIGMINE BROMIDE 60 MG PO TABS: 60.0000 mg | ORAL_TABLET | Freq: Three times a day (TID) | ORAL | 3 refills | Status: DC

## 2018-09-04 MED ORDER — MIDODRINE HCL 10 MG PO TABS: ORAL_TABLET | ORAL | 3 refills | Status: DC

## 2018-09-04 NOTE — Telephone Encounter (Signed)
Patient left VM on 09-01-18 the medication is raising her Blood Pressure she needs to know what to do dr

## 2018-09-04 NOTE — Progress Notes (Signed)
Virtual Visit via Video Note The purpose of this virtual visit is to provide medical care while limiting exposure to the novel coronavirus.    Consent was obtained for video visit:  Yes.   Answered questions that patient had about telehealth interaction:  Yes.   I discussed the limitations, risks, security and privacy concerns of performing an evaluation and management service by telemedicine. I also discussed with the patient that there may be a patient responsible charge related to this service. The patient expressed understanding and agreed to proceed.  Pt location: Home Physician Location: office Name of referring provider:  Plotnikov, Evie Lacks, MD I connected with Sabrina Page at patients initiation/request on 09/04/2018 at  9:00 AM EDT by video enabled telemedicine application and verified that I am speaking with the correct person using two identifiers. Pt MRN:  [id number] Pt DOB:  October 24, 1933 Video Participants:  Sabrina Page   History of Present Illness: This is a 85 y.o. female returning for follow-up of orthostasis.  She is accompanied by her daughter, who assists with translation.  [place] interpretor declined by patient.  She has been having elevated BP reading with her home physical therapist and therefore her midodrine was reduced to 10mg  BID from TID.  Despite this, she continues to have elevated SBP in the afternoon. Sitting BP is 160/90 today and upon standing it is 130/70. She has some lightheadedness, not as intense as before.   Daughter also says that her memory is progressively getting worse and she is more forgetful.  She cannot remember whether she has eaten her meals or taken medications.  Daughter is POA and oversees medications, meals, and finances.  There has not been any associated behavior changes or hallucinations.     Past Medical History:  Diagnosis Date  . Allergy    rhinitis  . Asthma   . Chronic kidney disease    hx of cystitis   .  COPD (chronic obstructive pulmonary disease) ([place])   . Depression   . Dizziness    vertigo   . Gallstones   . GERD (gastroesophageal reflux disease)   . Hyperlipidemia   . Hypertension   . Obesity   . Osteoarthritis   . Osteoporosis   . Peripheral vascular disease ([facility])    swelling   . Pneumonia   . Shortness of breath    with exertion   . Shoulder injury    left  . Sleep apnea    never diagnosed   . Urinary incontinence     Current Outpatient Medications on File Prior to Visit  Medication Sig Dispense Refill  . ARIPiprazole (ABILIFY) 2 MG tablet TAKE 1 TABLET BY MOUTH EVERY DAY 90 tablet 1  . aspirin 81 MG EC tablet Take 1 tablet (81 mg total) by mouth 2 (two) times daily. 60 tablet 0  . B Complex-C (SUPER B COMPLEX PO) Take 1 tablet by mouth every morning.     . Cholecalciferol 1000 UNITS capsule Take 1,000 Units by mouth every morning.     . clotrimazole-betamethasone (LOTRISONE) cream Apply 1 application topically 2 (two) times daily. 45 g 1  . fludrocortisone (FLORINEF) 0.1 MG tablet Take 1 tablet (0.1 mg total) by mouth daily. 90 tablet 3  . FLUoxetine (PROZAC) 40 MG capsule TAKE 1 CAPSULE BY MOUTH EVERY DAY 90 capsule 3  . ipratropium-albuterol (DUONEB) 0.5-2.5 (3) MG/3ML SOLN 1 neb every 6 hours if needed 75 mL 12  . loratadine (CLARITIN) 10 MG tablet TAKE 1 TABLET  BY MOUTH EVERY DAY IN THE MORNING 90 tablet 3  . memantine (NAMENDA) 10 MG tablet TAKE 1 TABLET BY MOUTH TWICE A DAY 180 tablet 3  . midodrine (PROAMATINE) 10 MG tablet TAKE 1 TABLET BY MOUTH THREE TIMES A DAY (Patient taking differently: 2 (two) times daily. ) 270 tablet 3  . Omega-3 Fatty Acids (FISH OIL) 1000 MG CAPS Take 1 capsule by mouth every morning.     Marland Kitchen omeprazole (PRILOSEC) 20 MG capsule TAKE 2 CAPSULES BY MOUTH EVERY DAY 180 capsule 3  . potassium chloride (KLOR-CON) 8 MEQ tablet TAKE 1 TABLET (8 MEQ TOTAL) BY MOUTH EVERY MORNING. 90 tablet 3  . traMADol (ULTRAM) 50 MG tablet Take 1 tablet (50 mg  total) by mouth every 12 (twelve) hours as needed for severe pain. 100 tablet 2   No current facility-administered medications on file prior to visit.      Review of Systems:  CONSTITUTIONAL: No fevers, chills, night sweats, or weight loss.  EYES: No visual changes or eye pain ENT: No hearing changes.  No history of nose bleeds.   RESPIRATORY: No cough, wheezing and shortness of breath.   CARDIOVASCULAR: Negative for chest pain, and palpitations.   GI: Negative for abdominal discomfort, blood in stools or black stools.  No recent change in bowel habits.   GU:  No history of incontinence.   MUSCLOSKELETAL: No history of joint pain or swelling.  No myalgias.   SKIN: Negative for lesions, rash, and itching.   ENDOCRINE: Negative for cold or heat intolerance, polydipsia or goiter.   PSYCH:  No depression or anxiety symptoms.   NEURO: As Above.  Observations/Objective:   Vitals:   09/04/18 0850  BP: (!) 160/90  This is a limited exam.  Patient is awake, alert, and appears comfortable.   No ptosis.  Face is symmetric.  Speech is not dysarthric, strong [place] accent.  Antigravity in all extremities.  No pronator drift.   Assessment and Plan:  1.  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  Alzheimer's dementia without behavior changes, moderate.  Daughter is POA, Engineer, site.  Natural progression of dementia was discussed.  Daughter was informed to monitor for signs of acute change in mental status which may suggest infection.  She denies any fever, cough, dysuria.  For memory, continue namenda 10mg  BID   Follow Up Instructions:   I discussed the assessment and treatment plan with the patient. The patient was provided an opportunity to ask questions and  all were answered. The patient agreed with the plan and demonstrated an understanding of the instructions.   The patient was advised to call back or seek an in-person evaluation if the symptoms worsen or if the condition fails to improve as anticipated.  Follow-up in 6 months  Total Time spent in visit with the patient was:  25 min, of which more than 50% of the time was spent in counseling and/or coordinating care.   Pt understands and agrees with the plan of care outlined.     Alda Berthold, DO

## 2018-09-04 NOTE — Telephone Encounter (Signed)
Patient saw Dr. Posey Pronto this morning.

## 2018-09-05 ENCOUNTER — Telehealth: Payer: Self-pay

## 2018-09-05 DIAGNOSIS — R300 Dysuria: Secondary | ICD-10-CM

## 2018-09-05 DIAGNOSIS — J45909 Unspecified asthma, uncomplicated: Secondary | ICD-10-CM | POA: Diagnosis not present

## 2018-09-05 DIAGNOSIS — M545 Low back pain: Secondary | ICD-10-CM | POA: Diagnosis not present

## 2018-09-05 DIAGNOSIS — G309 Alzheimer's disease, unspecified: Secondary | ICD-10-CM | POA: Diagnosis not present

## 2018-09-05 DIAGNOSIS — F0280 Dementia in other diseases classified elsewhere without behavioral disturbance: Secondary | ICD-10-CM | POA: Diagnosis not present

## 2018-09-05 DIAGNOSIS — I951 Orthostatic hypotension: Secondary | ICD-10-CM | POA: Diagnosis not present

## 2018-09-05 DIAGNOSIS — R0781 Pleurodynia: Secondary | ICD-10-CM | POA: Diagnosis not present

## 2018-09-05 NOTE — Telephone Encounter (Signed)
pls drop off a UA sample Thx

## 2018-09-05 NOTE — Telephone Encounter (Signed)
FYI

## 2018-09-05 NOTE — Telephone Encounter (Signed)
Copied from [place] [id number]. Topic: General - Inquiry >> Sep 05, 2018 12:49 PM Rainey Pines A wrote: Reason for CRM:  Charisse from home health  called to give Dr. Alain Marion an update on patient. Charisse stated that patients vitals were good. However patient has increased confusion since yesterday.Patient is not feeling well. Charisse also stated that patients' urine smells like she may have an UTI. Charisse [phone]

## 2018-09-06 DIAGNOSIS — G309 Alzheimer's disease, unspecified: Secondary | ICD-10-CM | POA: Diagnosis not present

## 2018-09-06 DIAGNOSIS — J45909 Unspecified asthma, uncomplicated: Secondary | ICD-10-CM | POA: Diagnosis not present

## 2018-09-06 DIAGNOSIS — I951 Orthostatic hypotension: Secondary | ICD-10-CM | POA: Diagnosis not present

## 2018-09-06 DIAGNOSIS — M545 Low back pain: Secondary | ICD-10-CM | POA: Diagnosis not present

## 2018-09-06 DIAGNOSIS — R0781 Pleurodynia: Secondary | ICD-10-CM | POA: Diagnosis not present

## 2018-09-06 DIAGNOSIS — F0280 Dementia in other diseases classified elsewhere without behavioral disturbance: Secondary | ICD-10-CM | POA: Diagnosis not present

## 2018-09-06 NOTE — Telephone Encounter (Signed)
LM with daughter to bring in urine sample

## 2018-09-06 NOTE — Telephone Encounter (Signed)
Notified patient.

## 2018-09-07 ENCOUNTER — Other Ambulatory Visit (INDEPENDENT_AMBULATORY_CARE_PROVIDER_SITE_OTHER): Payer: Medicare Other

## 2018-09-07 ENCOUNTER — Other Ambulatory Visit: Payer: Self-pay | Admitting: Internal Medicine

## 2018-09-07 ENCOUNTER — Telehealth: Payer: Self-pay | Admitting: Internal Medicine

## 2018-09-07 DIAGNOSIS — I951 Orthostatic hypotension: Secondary | ICD-10-CM | POA: Diagnosis not present

## 2018-09-07 DIAGNOSIS — R300 Dysuria: Secondary | ICD-10-CM

## 2018-09-07 DIAGNOSIS — F0280 Dementia in other diseases classified elsewhere without behavioral disturbance: Secondary | ICD-10-CM | POA: Diagnosis not present

## 2018-09-07 DIAGNOSIS — G309 Alzheimer's disease, unspecified: Secondary | ICD-10-CM | POA: Diagnosis not present

## 2018-09-07 DIAGNOSIS — J45909 Unspecified asthma, uncomplicated: Secondary | ICD-10-CM | POA: Diagnosis not present

## 2018-09-07 DIAGNOSIS — R0781 Pleurodynia: Secondary | ICD-10-CM | POA: Diagnosis not present

## 2018-09-07 DIAGNOSIS — M545 Low back pain: Secondary | ICD-10-CM | POA: Diagnosis not present

## 2018-09-07 LAB — URINALYSIS, ROUTINE W REFLEX MICROSCOPIC
Bilirubin Urine: NEGATIVE
Hgb urine dipstick: NEGATIVE
Ketones, ur: NEGATIVE
Leukocytes,Ua: NEGATIVE
Nitrite: NEGATIVE
RBC / HPF: NONE SEEN (ref 0–?)
Specific Gravity, Urine: >=1.03 — AB (ref 1.000–1.030)
Total Protein, Urine: NEGATIVE
Urine Glucose: NEGATIVE
Urobilinogen, UA: 1 (ref 0.0–1.0)
pH: 5 (ref 5.0–8.0)

## 2018-09-07 NOTE — Telephone Encounter (Signed)
Copied from [place] [id number]. Topic: Quick Communication - Home Health Verbal Orders >> Sep 07, 2018  1:27 PM Rayann Heman wrote: Caller/Agency: Severiano [facility] /[facility]  Callback Number: [phone] Requesting exstend PT  Frequency:2x3

## 2018-09-07 NOTE — Telephone Encounter (Signed)
Ok Thx 

## 2018-09-07 NOTE — Addendum Note (Signed)
Addended by: Karren Cobble on: 09/07/2018 03:39 PM   Modules accepted: Orders

## 2018-09-08 DIAGNOSIS — F0280 Dementia in other diseases classified elsewhere without behavioral disturbance: Secondary | ICD-10-CM | POA: Diagnosis not present

## 2018-09-08 DIAGNOSIS — R0781 Pleurodynia: Secondary | ICD-10-CM | POA: Diagnosis not present

## 2018-09-08 DIAGNOSIS — M545 Low back pain: Secondary | ICD-10-CM | POA: Diagnosis not present

## 2018-09-08 DIAGNOSIS — G309 Alzheimer's disease, unspecified: Secondary | ICD-10-CM | POA: Diagnosis not present

## 2018-09-08 DIAGNOSIS — J45909 Unspecified asthma, uncomplicated: Secondary | ICD-10-CM | POA: Diagnosis not present

## 2018-09-08 DIAGNOSIS — I951 Orthostatic hypotension: Secondary | ICD-10-CM | POA: Diagnosis not present

## 2018-09-08 LAB — URINE CULTURE
MICRO NUMBER:: 400463
SPECIMEN QUALITY:: ADEQUATE

## 2018-09-08 NOTE — Telephone Encounter (Signed)
Notified Sabrina Page w/MD response.Marland KitchenJohny Chess

## 2018-09-11 ENCOUNTER — Ambulatory Visit: Payer: Medicare Other | Admitting: Internal Medicine

## 2018-09-11 DIAGNOSIS — R0781 Pleurodynia: Secondary | ICD-10-CM | POA: Diagnosis not present

## 2018-09-11 DIAGNOSIS — M545 Low back pain: Secondary | ICD-10-CM | POA: Diagnosis not present

## 2018-09-11 DIAGNOSIS — F0280 Dementia in other diseases classified elsewhere without behavioral disturbance: Secondary | ICD-10-CM | POA: Diagnosis not present

## 2018-09-11 DIAGNOSIS — I951 Orthostatic hypotension: Secondary | ICD-10-CM | POA: Diagnosis not present

## 2018-09-11 DIAGNOSIS — G309 Alzheimer's disease, unspecified: Secondary | ICD-10-CM | POA: Diagnosis not present

## 2018-09-11 DIAGNOSIS — J45909 Unspecified asthma, uncomplicated: Secondary | ICD-10-CM | POA: Diagnosis not present

## 2018-09-12 ENCOUNTER — Telehealth: Payer: Self-pay | Admitting: Internal Medicine

## 2018-09-12 NOTE — Telephone Encounter (Signed)
Copied from [place] [id number]. Topic: Quick Communication - Home Health Verbal Orders >> Sep 12, 2018 10:14 AM Alanda Slim E wrote: Caller/Agency: [facility]/ [facility] Number: [phone] Requesting Pt to be put on hold due to Pt having bed bugs  Frequency:

## 2018-09-13 NOTE — Telephone Encounter (Signed)
FYI

## 2018-09-13 NOTE — Telephone Encounter (Signed)
Noted. Thanks.

## 2018-09-13 NOTE — Telephone Encounter (Signed)
sherre with [facility] advised dr is aware orders for home health are on hold at this time due to bed bugs in the home.

## 2018-09-18 NOTE — Telephone Encounter (Signed)
Verbal orders given to resume PT and OT, FYI

## 2018-09-18 NOTE — Telephone Encounter (Signed)
Sabrina Page with [facility] called and said that the issue has been resolve if Dr. Alain Marion can put in order for patient.

## 2018-09-21 DIAGNOSIS — F0280 Dementia in other diseases classified elsewhere without behavioral disturbance: Secondary | ICD-10-CM | POA: Diagnosis not present

## 2018-09-21 DIAGNOSIS — J45909 Unspecified asthma, uncomplicated: Secondary | ICD-10-CM | POA: Diagnosis not present

## 2018-09-21 DIAGNOSIS — G309 Alzheimer's disease, unspecified: Secondary | ICD-10-CM | POA: Diagnosis not present

## 2018-09-21 DIAGNOSIS — I951 Orthostatic hypotension: Secondary | ICD-10-CM | POA: Diagnosis not present

## 2018-09-21 DIAGNOSIS — R0781 Pleurodynia: Secondary | ICD-10-CM | POA: Diagnosis not present

## 2018-09-21 DIAGNOSIS — M545 Low back pain: Secondary | ICD-10-CM | POA: Diagnosis not present

## 2018-09-22 DIAGNOSIS — G309 Alzheimer's disease, unspecified: Secondary | ICD-10-CM | POA: Diagnosis not present

## 2018-09-22 DIAGNOSIS — M545 Low back pain: Secondary | ICD-10-CM | POA: Diagnosis not present

## 2018-09-22 DIAGNOSIS — F0280 Dementia in other diseases classified elsewhere without behavioral disturbance: Secondary | ICD-10-CM | POA: Diagnosis not present

## 2018-09-22 DIAGNOSIS — J45909 Unspecified asthma, uncomplicated: Secondary | ICD-10-CM | POA: Diagnosis not present

## 2018-09-22 DIAGNOSIS — I951 Orthostatic hypotension: Secondary | ICD-10-CM | POA: Diagnosis not present

## 2018-09-22 DIAGNOSIS — R0781 Pleurodynia: Secondary | ICD-10-CM | POA: Diagnosis not present

## 2018-09-25 DIAGNOSIS — I951 Orthostatic hypotension: Secondary | ICD-10-CM | POA: Diagnosis not present

## 2018-09-25 DIAGNOSIS — J45909 Unspecified asthma, uncomplicated: Secondary | ICD-10-CM | POA: Diagnosis not present

## 2018-09-25 DIAGNOSIS — F0280 Dementia in other diseases classified elsewhere without behavioral disturbance: Secondary | ICD-10-CM | POA: Diagnosis not present

## 2018-09-25 DIAGNOSIS — G309 Alzheimer's disease, unspecified: Secondary | ICD-10-CM | POA: Diagnosis not present

## 2018-09-25 DIAGNOSIS — R0781 Pleurodynia: Secondary | ICD-10-CM | POA: Diagnosis not present

## 2018-09-25 DIAGNOSIS — M545 Low back pain: Secondary | ICD-10-CM | POA: Diagnosis not present

## 2018-09-28 ENCOUNTER — Telehealth: Payer: Self-pay | Admitting: Internal Medicine

## 2018-09-28 NOTE — Telephone Encounter (Unsigned)
Copied from [place] [id number]. Topic: Quick Communication - Home Health Verbal Orders >> Sep 28, 2018  4:53 PM Yvette Rack wrote: Caller/Agency: Radovan with [facility] Number: [phone] may leave message Requesting OT/PT/Skilled Nursing/Social Work/Speech Therapy: OT Frequency: 1 time a week for 5 weeks which includes this week

## 2018-09-29 DIAGNOSIS — J45909 Unspecified asthma, uncomplicated: Secondary | ICD-10-CM | POA: Diagnosis not present

## 2018-09-29 DIAGNOSIS — I951 Orthostatic hypotension: Secondary | ICD-10-CM | POA: Diagnosis not present

## 2018-09-29 DIAGNOSIS — M545 Low back pain: Secondary | ICD-10-CM | POA: Diagnosis not present

## 2018-09-29 DIAGNOSIS — G309 Alzheimer's disease, unspecified: Secondary | ICD-10-CM | POA: Diagnosis not present

## 2018-09-29 DIAGNOSIS — R0781 Pleurodynia: Secondary | ICD-10-CM | POA: Diagnosis not present

## 2018-09-29 DIAGNOSIS — F0280 Dementia in other diseases classified elsewhere without behavioral disturbance: Secondary | ICD-10-CM | POA: Diagnosis not present

## 2018-09-30 DIAGNOSIS — M545 Low back pain: Secondary | ICD-10-CM | POA: Diagnosis not present

## 2018-09-30 DIAGNOSIS — Z9181 History of falling: Secondary | ICD-10-CM | POA: Diagnosis not present

## 2018-09-30 DIAGNOSIS — I951 Orthostatic hypotension: Secondary | ICD-10-CM | POA: Diagnosis not present

## 2018-09-30 DIAGNOSIS — J45909 Unspecified asthma, uncomplicated: Secondary | ICD-10-CM | POA: Diagnosis not present

## 2018-09-30 DIAGNOSIS — F0280 Dementia in other diseases classified elsewhere without behavioral disturbance: Secondary | ICD-10-CM | POA: Diagnosis not present

## 2018-09-30 DIAGNOSIS — F3289 Other specified depressive episodes: Secondary | ICD-10-CM | POA: Diagnosis not present

## 2018-09-30 DIAGNOSIS — G309 Alzheimer's disease, unspecified: Secondary | ICD-10-CM | POA: Diagnosis not present

## 2018-10-02 ENCOUNTER — Ambulatory Visit: Payer: Medicare Other | Admitting: Neurology

## 2018-10-02 DIAGNOSIS — J45909 Unspecified asthma, uncomplicated: Secondary | ICD-10-CM | POA: Diagnosis not present

## 2018-10-02 DIAGNOSIS — G309 Alzheimer's disease, unspecified: Secondary | ICD-10-CM | POA: Diagnosis not present

## 2018-10-02 DIAGNOSIS — F0280 Dementia in other diseases classified elsewhere without behavioral disturbance: Secondary | ICD-10-CM | POA: Diagnosis not present

## 2018-10-02 DIAGNOSIS — F3289 Other specified depressive episodes: Secondary | ICD-10-CM | POA: Diagnosis not present

## 2018-10-02 DIAGNOSIS — I951 Orthostatic hypotension: Secondary | ICD-10-CM | POA: Diagnosis not present

## 2018-10-02 DIAGNOSIS — M545 Low back pain: Secondary | ICD-10-CM | POA: Diagnosis not present

## 2018-10-02 NOTE — Telephone Encounter (Signed)
Need extension for PT orders   2x's wk for 4wk

## 2018-10-03 NOTE — Telephone Encounter (Signed)
Verbals given  

## 2018-10-03 NOTE — Telephone Encounter (Signed)
Verbals given, FYI 

## 2018-10-04 DIAGNOSIS — M545 Low back pain: Secondary | ICD-10-CM | POA: Diagnosis not present

## 2018-10-04 DIAGNOSIS — I951 Orthostatic hypotension: Secondary | ICD-10-CM | POA: Diagnosis not present

## 2018-10-04 DIAGNOSIS — J45909 Unspecified asthma, uncomplicated: Secondary | ICD-10-CM | POA: Diagnosis not present

## 2018-10-04 DIAGNOSIS — F3289 Other specified depressive episodes: Secondary | ICD-10-CM | POA: Diagnosis not present

## 2018-10-04 DIAGNOSIS — G309 Alzheimer's disease, unspecified: Secondary | ICD-10-CM | POA: Diagnosis not present

## 2018-10-04 DIAGNOSIS — F0280 Dementia in other diseases classified elsewhere without behavioral disturbance: Secondary | ICD-10-CM | POA: Diagnosis not present

## 2018-10-06 DIAGNOSIS — F3289 Other specified depressive episodes: Secondary | ICD-10-CM | POA: Diagnosis not present

## 2018-10-06 DIAGNOSIS — M545 Low back pain: Secondary | ICD-10-CM | POA: Diagnosis not present

## 2018-10-06 DIAGNOSIS — I951 Orthostatic hypotension: Secondary | ICD-10-CM | POA: Diagnosis not present

## 2018-10-06 DIAGNOSIS — F0280 Dementia in other diseases classified elsewhere without behavioral disturbance: Secondary | ICD-10-CM | POA: Diagnosis not present

## 2018-10-06 DIAGNOSIS — J45909 Unspecified asthma, uncomplicated: Secondary | ICD-10-CM | POA: Diagnosis not present

## 2018-10-06 DIAGNOSIS — G309 Alzheimer's disease, unspecified: Secondary | ICD-10-CM | POA: Diagnosis not present

## 2018-10-09 DIAGNOSIS — J45909 Unspecified asthma, uncomplicated: Secondary | ICD-10-CM | POA: Diagnosis not present

## 2018-10-09 DIAGNOSIS — M545 Low back pain: Secondary | ICD-10-CM | POA: Diagnosis not present

## 2018-10-09 DIAGNOSIS — F3289 Other specified depressive episodes: Secondary | ICD-10-CM | POA: Diagnosis not present

## 2018-10-09 DIAGNOSIS — G309 Alzheimer's disease, unspecified: Secondary | ICD-10-CM | POA: Diagnosis not present

## 2018-10-09 DIAGNOSIS — F0280 Dementia in other diseases classified elsewhere without behavioral disturbance: Secondary | ICD-10-CM | POA: Diagnosis not present

## 2018-10-09 DIAGNOSIS — I951 Orthostatic hypotension: Secondary | ICD-10-CM | POA: Diagnosis not present

## 2018-10-20 DIAGNOSIS — I951 Orthostatic hypotension: Secondary | ICD-10-CM | POA: Diagnosis not present

## 2018-10-20 DIAGNOSIS — G309 Alzheimer's disease, unspecified: Secondary | ICD-10-CM | POA: Diagnosis not present

## 2018-10-20 DIAGNOSIS — F3289 Other specified depressive episodes: Secondary | ICD-10-CM | POA: Diagnosis not present

## 2018-10-20 DIAGNOSIS — M545 Low back pain: Secondary | ICD-10-CM | POA: Diagnosis not present

## 2018-10-20 DIAGNOSIS — F0280 Dementia in other diseases classified elsewhere without behavioral disturbance: Secondary | ICD-10-CM | POA: Diagnosis not present

## 2018-10-20 DIAGNOSIS — J45909 Unspecified asthma, uncomplicated: Secondary | ICD-10-CM | POA: Diagnosis not present

## 2018-10-23 DIAGNOSIS — M545 Low back pain: Secondary | ICD-10-CM | POA: Diagnosis not present

## 2018-10-23 DIAGNOSIS — F3289 Other specified depressive episodes: Secondary | ICD-10-CM | POA: Diagnosis not present

## 2018-10-23 DIAGNOSIS — F0280 Dementia in other diseases classified elsewhere without behavioral disturbance: Secondary | ICD-10-CM | POA: Diagnosis not present

## 2018-10-23 DIAGNOSIS — I951 Orthostatic hypotension: Secondary | ICD-10-CM | POA: Diagnosis not present

## 2018-10-23 DIAGNOSIS — G309 Alzheimer's disease, unspecified: Secondary | ICD-10-CM | POA: Diagnosis not present

## 2018-10-23 DIAGNOSIS — J45909 Unspecified asthma, uncomplicated: Secondary | ICD-10-CM | POA: Diagnosis not present

## 2018-10-25 ENCOUNTER — Telehealth: Payer: Self-pay | Admitting: Internal Medicine

## 2018-10-25 DIAGNOSIS — F3289 Other specified depressive episodes: Secondary | ICD-10-CM | POA: Diagnosis not present

## 2018-10-25 DIAGNOSIS — J45909 Unspecified asthma, uncomplicated: Secondary | ICD-10-CM | POA: Diagnosis not present

## 2018-10-25 DIAGNOSIS — I951 Orthostatic hypotension: Secondary | ICD-10-CM | POA: Diagnosis not present

## 2018-10-25 DIAGNOSIS — G309 Alzheimer's disease, unspecified: Secondary | ICD-10-CM | POA: Diagnosis not present

## 2018-10-25 DIAGNOSIS — M545 Low back pain: Secondary | ICD-10-CM | POA: Diagnosis not present

## 2018-10-25 DIAGNOSIS — F0280 Dementia in other diseases classified elsewhere without behavioral disturbance: Secondary | ICD-10-CM | POA: Diagnosis not present

## 2018-10-25 NOTE — Telephone Encounter (Signed)
Copied from [place] [id number]. Topic: Quick Communication - Home Health Verbal Orders >> Oct 25, 2018  3:56 PM Rutherford Nail, NT wrote: Caller/Agency: [facility] Number: [phone] (Can leave voicemail) Requesting OT/PT/Skilled Nursing/Social Work/Speech Therapy: Occupational Therapy Frequency:  1x a week for 4 weeks  Beginning next week

## 2018-10-26 ENCOUNTER — Ambulatory Visit: Payer: Medicare Other | Admitting: Internal Medicine

## 2018-10-26 NOTE — Telephone Encounter (Signed)
Called Radovan there was no answer LMOM w/MD response.Marland KitchenJohny Chess

## 2018-10-26 NOTE — Telephone Encounter (Signed)
Ok Thx 

## 2018-10-27 DIAGNOSIS — M545 Low back pain: Secondary | ICD-10-CM | POA: Diagnosis not present

## 2018-10-27 DIAGNOSIS — I951 Orthostatic hypotension: Secondary | ICD-10-CM | POA: Diagnosis not present

## 2018-10-27 DIAGNOSIS — F0280 Dementia in other diseases classified elsewhere without behavioral disturbance: Secondary | ICD-10-CM | POA: Diagnosis not present

## 2018-10-27 DIAGNOSIS — F3289 Other specified depressive episodes: Secondary | ICD-10-CM | POA: Diagnosis not present

## 2018-10-27 DIAGNOSIS — G309 Alzheimer's disease, unspecified: Secondary | ICD-10-CM | POA: Diagnosis not present

## 2018-10-27 DIAGNOSIS — J45909 Unspecified asthma, uncomplicated: Secondary | ICD-10-CM | POA: Diagnosis not present

## 2018-10-30 ENCOUNTER — Telehealth: Payer: Self-pay

## 2018-10-30 DIAGNOSIS — J45909 Unspecified asthma, uncomplicated: Secondary | ICD-10-CM | POA: Diagnosis not present

## 2018-10-30 DIAGNOSIS — F0280 Dementia in other diseases classified elsewhere without behavioral disturbance: Secondary | ICD-10-CM | POA: Diagnosis not present

## 2018-10-30 DIAGNOSIS — I951 Orthostatic hypotension: Secondary | ICD-10-CM | POA: Diagnosis not present

## 2018-10-30 DIAGNOSIS — Z9181 History of falling: Secondary | ICD-10-CM | POA: Diagnosis not present

## 2018-10-30 DIAGNOSIS — G309 Alzheimer's disease, unspecified: Secondary | ICD-10-CM | POA: Diagnosis not present

## 2018-10-30 DIAGNOSIS — M545 Low back pain: Secondary | ICD-10-CM | POA: Diagnosis not present

## 2018-10-30 DIAGNOSIS — F3289 Other specified depressive episodes: Secondary | ICD-10-CM | POA: Diagnosis not present

## 2018-10-30 NOTE — Telephone Encounter (Signed)
Copied from [place] [id number]. Topic: Quick Communication - Home Health Verbal Orders >> Oct 30, 2018  9:28 AM Richardo Priest, NT wrote: Caller/Agency: [facility] Number: [phone] and it is ok to leave a VM Requesting OT/PT/Skilled Nursing/Social Work/Speech Therapy: PT Frequency: twice a week for two more weeks.

## 2018-11-01 ENCOUNTER — Other Ambulatory Visit: Payer: Self-pay

## 2018-11-01 ENCOUNTER — Encounter: Payer: Self-pay | Admitting: Internal Medicine

## 2018-11-01 ENCOUNTER — Other Ambulatory Visit (INDEPENDENT_AMBULATORY_CARE_PROVIDER_SITE_OTHER): Payer: Medicare Other

## 2018-11-01 ENCOUNTER — Ambulatory Visit (INDEPENDENT_AMBULATORY_CARE_PROVIDER_SITE_OTHER): Payer: Medicare Other | Admitting: Internal Medicine

## 2018-11-01 VITALS — BP 132/64 | HR 87 | Temp 98.1°F | Ht 64.0 in | Wt 246.0 lb

## 2018-11-01 DIAGNOSIS — R739 Hyperglycemia, unspecified: Secondary | ICD-10-CM

## 2018-11-01 DIAGNOSIS — R32 Unspecified urinary incontinence: Secondary | ICD-10-CM | POA: Insufficient documentation

## 2018-11-01 DIAGNOSIS — F0280 Dementia in other diseases classified elsewhere without behavioral disturbance: Secondary | ICD-10-CM | POA: Diagnosis not present

## 2018-11-01 DIAGNOSIS — I10 Essential (primary) hypertension: Secondary | ICD-10-CM

## 2018-11-01 DIAGNOSIS — N39498 Other specified urinary incontinence: Secondary | ICD-10-CM

## 2018-11-01 DIAGNOSIS — G309 Alzheimer's disease, unspecified: Secondary | ICD-10-CM | POA: Diagnosis not present

## 2018-11-01 LAB — BASIC METABOLIC PANEL
BUN: 14 mg/dL (ref 6–23)
CO2: 29 mEq/L (ref 19–32)
Calcium: 8.9 mg/dL (ref 8.4–10.5)
Chloride: 103 mEq/L (ref 96–112)
Creatinine, Ser: 0.91 mg/dL (ref 0.40–1.20)
GFR: 59.03 mL/min — ABNORMAL LOW (ref >60.00)
Glucose, Bld: 154 mg/dL — ABNORMAL HIGH (ref 70–99)
Potassium: 4 mEq/L (ref 3.5–5.1)
Sodium: 138 mEq/L (ref 135–145)

## 2018-11-01 LAB — HEMOGLOBIN A1C: Hgb A1c MFr Bld: 5.7 % (ref 4.6–6.5)

## 2018-11-01 NOTE — Assessment & Plan Note (Signed)
Indapamide po (stopped)

## 2018-11-01 NOTE — Assessment & Plan Note (Signed)
Worse 

## 2018-11-01 NOTE — Progress Notes (Signed)
Subjective:  Patient ID: Sabrina Page, female    DOB: 1934-01-13  Age: 85 y.o. MRN: [id number]  CC: No chief complaint on file.   HPI Sabrina Page presents for dementia, incontinence, OA. Doing worse. They had bed bugs Needs a w/c C/o incontinence  Outpatient Medications Prior to Visit  Medication Sig Dispense Refill  . ARIPiprazole (ABILIFY) 2 MG tablet TAKE 1 TABLET BY MOUTH EVERY DAY 90 tablet 1  . aspirin 81 MG EC tablet Take 1 tablet (81 mg total) by mouth 2 (two) times daily. 60 tablet 0  . B Complex-C (SUPER B COMPLEX PO) Take 1 tablet by mouth every morning.     . Cholecalciferol 1000 UNITS capsule Take 1,000 Units by mouth every morning.     . clotrimazole-betamethasone (LOTRISONE) cream Apply 1 application topically 2 (two) times daily. 45 g 1  . fludrocortisone (FLORINEF) 0.1 MG tablet TAKE 1 TABLET BY MOUTH EVERY DAY 90 tablet 3  . FLUoxetine (PROZAC) 40 MG capsule TAKE 1 CAPSULE BY MOUTH EVERY DAY 90 capsule 3  . ipratropium-albuterol (DUONEB) 0.5-2.5 (3) MG/3ML SOLN 1 neb every 6 hours if needed 75 mL 12  . loratadine (CLARITIN) 10 MG tablet TAKE 1 TABLET BY MOUTH EVERY DAY IN THE MORNING 90 tablet 3  . memantine (NAMENDA) 10 MG tablet TAKE 1 TABLET BY MOUTH TWICE A DAY 180 tablet 3  . midodrine (PROAMATINE) 10 MG tablet Take 5mg  in the morning (half-tab) and 10mg  in the evening. 270 tablet 3  . Omega-3 Fatty Acids (FISH OIL) 1000 MG CAPS Take 1 capsule by mouth every morning.     Marland Kitchen omeprazole (PRILOSEC) 20 MG capsule TAKE 2 CAPSULES BY MOUTH EVERY DAY 180 capsule 3  . potassium chloride (KLOR-CON) 8 MEQ tablet TAKE 1 TABLET (8 MEQ TOTAL) BY MOUTH EVERY MORNING. 90 tablet 3  . pyridostigmine (MESTINON) 60 MG tablet Take 1 tablet (60 mg total) by mouth 3 (three) times daily. 270 tablet 3  . traMADol (ULTRAM) 50 MG tablet Take 1 tablet (50 mg total) by mouth every 12 (twelve) hours as needed for severe pain. 100 tablet 2   No facility-administered  medications prior to visit.     ROS: Review of Systems  Constitutional: Negative for activity change, appetite change, chills, fatigue and unexpected weight change.  HENT: Negative for congestion, mouth sores and sinus pressure.   Eyes: Negative for visual disturbance.  Respiratory: Negative for cough and chest tightness.   Gastrointestinal: Negative for abdominal pain and nausea.  Genitourinary: Negative for difficulty urinating, frequency and vaginal pain.  Musculoskeletal: Negative for back pain and gait problem.  Skin: Negative for pallor and rash.  Neurological: Negative for dizziness, tremors, weakness, numbness and headaches.  Psychiatric/Behavioral: Positive for behavioral problems, confusion, decreased concentration and dysphoric mood. Negative for sleep disturbance and suicidal ideas. The patient is nervous/anxious.     Objective:  BP 132/64 (BP Location: Right Arm, Patient Position: Sitting, Cuff Size: Large)   Pulse 87   Temp 98.1 F (36.7 C) (Oral)   Ht 5\' 4"  (1.626 m)   Wt 246 lb (111.6 kg)   SpO2 94%   BMI 42.23 kg/m   BP Readings from Last 3 Encounters:  11/01/18 132/64  09/04/18 (!) 160/90  07/24/18 128/76    Wt Readings from Last 3 Encounters:  11/01/18 246 lb (111.6 kg)  09/04/18 240 lb (108.9 kg)  07/24/18 246 lb (111.6 kg)    Physical Exam Constitutional:      General: She  is not in acute distress.    Appearance: She is well-developed.  HENT:     Head: Normocephalic.     Right Ear: External ear normal.     Left Ear: External ear normal.     Nose: Nose normal.  Eyes:     General:        Right eye: No discharge.        Left eye: No discharge.     Conjunctiva/sclera: Conjunctivae normal.     Pupils: Pupils are equal, round, and reactive to light.  Neck:     Musculoskeletal: Normal range of motion and neck supple.     Thyroid: No thyromegaly.     Vascular: No JVD.     Trachea: No tracheal deviation.  Cardiovascular:     Rate and Rhythm:  Normal rate and regular rhythm.     Heart sounds: Normal heart sounds.  Pulmonary:     Effort: No respiratory distress.     Breath sounds: No stridor. No wheezing.  Abdominal:     General: Bowel sounds are normal. There is no distension.     Palpations: Abdomen is soft. There is no mass.     Tenderness: There is no abdominal tenderness. There is no guarding or rebound.  Musculoskeletal:        General: Tenderness present.  Lymphadenopathy:     Cervical: No cervical adenopathy.  Skin:    Findings: No erythema or rash.  Neurological:     Cranial Nerves: No cranial nerve deficit.     Motor: Weakness present. No abnormal muscle tone.     Coordination: Coordination abnormal.     Gait: Gait abnormal.     Deep Tendon Reflexes: Reflexes normal.  Psychiatric:        Behavior: Behavior normal.    In a w/c Obese A/c disoriented       Lab Results  Component Value Date   WBC 5.1 12/21/2017   HGB 13.9 12/21/2017   HCT 42.6 12/21/2017   PLT 183.0 12/21/2017   GLUCOSE 153 (H) 07/24/2018   CHOL 270 (H) 06/12/2014   TRIG 132.0 06/12/2014   HDL 52.40 06/12/2014   LDLDIRECT 159.1 05/16/2012   LDLCALC 191 (H) 06/12/2014   ALT 13 07/24/2018   AST 13 07/24/2018   NA 137 07/24/2018   K 3.8 07/24/2018   CL 102 07/24/2018   CREATININE 0.85 07/24/2018   BUN 16 07/24/2018   CO2 28 07/24/2018   TSH 1.99 07/24/2018   INR 1.07 04/16/2016   HGBA1C 5.6 12/25/2015    Dg Chest 2 View  Result Date: 12/28/2017 CLINICAL DATA:  Chest pain and right flank pain secondary to a fall 7 days ago. EXAM: CHEST - 2 VIEW COMPARISON:  Rib radiographs dated 12/28/2017 and chest x-ray dated 04/18/2016 FINDINGS: The heart size and mediastinal contours are within normal limits. Both lungs are clear. The visualized skeletal structures are unremarkable. IMPRESSION: No active cardiopulmonary disease. Electronically Signed   By: Lorriane Shire M.D.   On: 12/28/2017 10:45   Dg Ribs Unilateral Right  Result  Date: 12/28/2017 CLINICAL DATA:  Status post fall 7 days ago with right rib pain. EXAM: RIGHT RIBS - 2 VIEW COMPARISON:  April 18, 2016 FINDINGS: No fracture or other bone lesions are seen involving the ribs. Prior cholecystectomy clips are noted. IMPRESSION: No acute fracture or dislocation of right ribs. Electronically Signed   By: Abelardo Diesel M.D.   On: 12/28/2017 10:44    Assessment & Plan:  There are no diagnoses linked to this encounter.   No orders of the defined types were placed in this encounter.    Follow-up: No follow-ups on file.  [name], MD

## 2018-11-01 NOTE — Assessment & Plan Note (Signed)
Discussed No treatment at present UA was ok in April 2020

## 2018-11-03 NOTE — Telephone Encounter (Signed)
Verbals given  

## 2018-11-06 DIAGNOSIS — F3289 Other specified depressive episodes: Secondary | ICD-10-CM | POA: Diagnosis not present

## 2018-11-06 DIAGNOSIS — J45909 Unspecified asthma, uncomplicated: Secondary | ICD-10-CM | POA: Diagnosis not present

## 2018-11-06 DIAGNOSIS — F0280 Dementia in other diseases classified elsewhere without behavioral disturbance: Secondary | ICD-10-CM | POA: Diagnosis not present

## 2018-11-06 DIAGNOSIS — M545 Low back pain: Secondary | ICD-10-CM | POA: Diagnosis not present

## 2018-11-06 DIAGNOSIS — G309 Alzheimer's disease, unspecified: Secondary | ICD-10-CM | POA: Diagnosis not present

## 2018-11-06 DIAGNOSIS — I951 Orthostatic hypotension: Secondary | ICD-10-CM | POA: Diagnosis not present

## 2018-11-07 DIAGNOSIS — I951 Orthostatic hypotension: Secondary | ICD-10-CM | POA: Diagnosis not present

## 2018-11-07 DIAGNOSIS — F3289 Other specified depressive episodes: Secondary | ICD-10-CM | POA: Diagnosis not present

## 2018-11-07 DIAGNOSIS — J45909 Unspecified asthma, uncomplicated: Secondary | ICD-10-CM | POA: Diagnosis not present

## 2018-11-07 DIAGNOSIS — G309 Alzheimer's disease, unspecified: Secondary | ICD-10-CM | POA: Diagnosis not present

## 2018-11-07 DIAGNOSIS — M545 Low back pain: Secondary | ICD-10-CM | POA: Diagnosis not present

## 2018-11-07 DIAGNOSIS — F0280 Dementia in other diseases classified elsewhere without behavioral disturbance: Secondary | ICD-10-CM | POA: Diagnosis not present

## 2018-11-09 DIAGNOSIS — F3289 Other specified depressive episodes: Secondary | ICD-10-CM | POA: Diagnosis not present

## 2018-11-09 DIAGNOSIS — I951 Orthostatic hypotension: Secondary | ICD-10-CM | POA: Diagnosis not present

## 2018-11-09 DIAGNOSIS — M545 Low back pain: Secondary | ICD-10-CM | POA: Diagnosis not present

## 2018-11-09 DIAGNOSIS — J45909 Unspecified asthma, uncomplicated: Secondary | ICD-10-CM | POA: Diagnosis not present

## 2018-11-09 DIAGNOSIS — G309 Alzheimer's disease, unspecified: Secondary | ICD-10-CM | POA: Diagnosis not present

## 2018-11-09 DIAGNOSIS — F0280 Dementia in other diseases classified elsewhere without behavioral disturbance: Secondary | ICD-10-CM | POA: Diagnosis not present

## 2018-11-13 DIAGNOSIS — M545 Low back pain: Secondary | ICD-10-CM | POA: Diagnosis not present

## 2018-11-13 DIAGNOSIS — F3289 Other specified depressive episodes: Secondary | ICD-10-CM | POA: Diagnosis not present

## 2018-11-13 DIAGNOSIS — G309 Alzheimer's disease, unspecified: Secondary | ICD-10-CM | POA: Diagnosis not present

## 2018-11-13 DIAGNOSIS — I951 Orthostatic hypotension: Secondary | ICD-10-CM | POA: Diagnosis not present

## 2018-11-13 DIAGNOSIS — J45909 Unspecified asthma, uncomplicated: Secondary | ICD-10-CM | POA: Diagnosis not present

## 2018-11-13 DIAGNOSIS — F0280 Dementia in other diseases classified elsewhere without behavioral disturbance: Secondary | ICD-10-CM | POA: Diagnosis not present

## 2018-11-14 DIAGNOSIS — G309 Alzheimer's disease, unspecified: Secondary | ICD-10-CM | POA: Diagnosis not present

## 2018-11-14 DIAGNOSIS — F3289 Other specified depressive episodes: Secondary | ICD-10-CM | POA: Diagnosis not present

## 2018-11-14 DIAGNOSIS — F0280 Dementia in other diseases classified elsewhere without behavioral disturbance: Secondary | ICD-10-CM | POA: Diagnosis not present

## 2018-11-14 DIAGNOSIS — M545 Low back pain: Secondary | ICD-10-CM | POA: Diagnosis not present

## 2018-11-14 DIAGNOSIS — J45909 Unspecified asthma, uncomplicated: Secondary | ICD-10-CM | POA: Diagnosis not present

## 2018-11-14 DIAGNOSIS — I951 Orthostatic hypotension: Secondary | ICD-10-CM | POA: Diagnosis not present

## 2018-11-17 DIAGNOSIS — F0280 Dementia in other diseases classified elsewhere without behavioral disturbance: Secondary | ICD-10-CM | POA: Diagnosis not present

## 2018-11-17 DIAGNOSIS — G309 Alzheimer's disease, unspecified: Secondary | ICD-10-CM | POA: Diagnosis not present

## 2018-11-17 DIAGNOSIS — M545 Low back pain: Secondary | ICD-10-CM | POA: Diagnosis not present

## 2018-11-17 DIAGNOSIS — I951 Orthostatic hypotension: Secondary | ICD-10-CM | POA: Diagnosis not present

## 2018-11-17 DIAGNOSIS — J45909 Unspecified asthma, uncomplicated: Secondary | ICD-10-CM | POA: Diagnosis not present

## 2018-11-17 DIAGNOSIS — F3289 Other specified depressive episodes: Secondary | ICD-10-CM | POA: Diagnosis not present

## 2018-11-19 ENCOUNTER — Other Ambulatory Visit: Payer: Self-pay | Admitting: Internal Medicine

## 2018-11-20 ENCOUNTER — Telehealth: Payer: Self-pay | Admitting: Internal Medicine

## 2018-11-20 NOTE — Telephone Encounter (Signed)
Galanida PT with [facility] called in to get VO to continue working with pt    Frequency: 1 week 2    CB: [phone]

## 2018-11-20 NOTE — Telephone Encounter (Signed)
Verbals given, FYI 

## 2018-11-22 ENCOUNTER — Telehealth: Payer: Self-pay | Admitting: Internal Medicine

## 2018-11-22 DIAGNOSIS — J45909 Unspecified asthma, uncomplicated: Secondary | ICD-10-CM | POA: Diagnosis not present

## 2018-11-22 DIAGNOSIS — M545 Low back pain: Secondary | ICD-10-CM | POA: Diagnosis not present

## 2018-11-22 DIAGNOSIS — I951 Orthostatic hypotension: Secondary | ICD-10-CM | POA: Diagnosis not present

## 2018-11-22 DIAGNOSIS — F0280 Dementia in other diseases classified elsewhere without behavioral disturbance: Secondary | ICD-10-CM | POA: Diagnosis not present

## 2018-11-22 DIAGNOSIS — F3289 Other specified depressive episodes: Secondary | ICD-10-CM | POA: Diagnosis not present

## 2018-11-22 DIAGNOSIS — G309 Alzheimer's disease, unspecified: Secondary | ICD-10-CM | POA: Diagnosis not present

## 2018-11-22 NOTE — Telephone Encounter (Signed)
Left detailed message with verbal orders for below.  

## 2018-11-22 NOTE — Telephone Encounter (Signed)
Home Health Verbal Orders - Caller/Agency: Radovan with [facility] Number: [phone] Requesting OT/PT/Skilled Nursing/Social Work/Speech Therapy: pt is going to be Discharged from OT she has met her goals and she going to do 1 more week of PT

## 2018-11-22 NOTE — Telephone Encounter (Signed)
Ok Thx 

## 2018-11-23 ENCOUNTER — Telehealth: Payer: Self-pay | Admitting: Internal Medicine

## 2018-11-23 DIAGNOSIS — G309 Alzheimer's disease, unspecified: Secondary | ICD-10-CM | POA: Diagnosis not present

## 2018-11-23 DIAGNOSIS — F0280 Dementia in other diseases classified elsewhere without behavioral disturbance: Secondary | ICD-10-CM | POA: Diagnosis not present

## 2018-11-23 DIAGNOSIS — J45909 Unspecified asthma, uncomplicated: Secondary | ICD-10-CM | POA: Diagnosis not present

## 2018-11-23 DIAGNOSIS — I951 Orthostatic hypotension: Secondary | ICD-10-CM | POA: Diagnosis not present

## 2018-11-23 DIAGNOSIS — F3289 Other specified depressive episodes: Secondary | ICD-10-CM | POA: Diagnosis not present

## 2018-11-23 DIAGNOSIS — M545 Low back pain: Secondary | ICD-10-CM | POA: Diagnosis not present

## 2018-11-23 NOTE — Telephone Encounter (Signed)
[facility]ise [facility] is calling and would like order for eval for social worker for community resources

## 2018-11-23 NOTE — Telephone Encounter (Signed)
Verbals given, FYI 

## 2018-11-27 DIAGNOSIS — M545 Low back pain: Secondary | ICD-10-CM | POA: Diagnosis not present

## 2018-11-27 DIAGNOSIS — I951 Orthostatic hypotension: Secondary | ICD-10-CM | POA: Diagnosis not present

## 2018-11-27 DIAGNOSIS — F3289 Other specified depressive episodes: Secondary | ICD-10-CM | POA: Diagnosis not present

## 2018-11-27 DIAGNOSIS — G309 Alzheimer's disease, unspecified: Secondary | ICD-10-CM | POA: Diagnosis not present

## 2018-11-27 DIAGNOSIS — F0280 Dementia in other diseases classified elsewhere without behavioral disturbance: Secondary | ICD-10-CM | POA: Diagnosis not present

## 2018-11-27 DIAGNOSIS — J45909 Unspecified asthma, uncomplicated: Secondary | ICD-10-CM | POA: Diagnosis not present

## 2018-11-28 ENCOUNTER — Encounter: Payer: Self-pay | Admitting: Internal Medicine

## 2018-11-28 ENCOUNTER — Ambulatory Visit (INDEPENDENT_AMBULATORY_CARE_PROVIDER_SITE_OTHER): Payer: Medicare Other | Admitting: Internal Medicine

## 2018-11-28 ENCOUNTER — Other Ambulatory Visit: Payer: Self-pay

## 2018-11-28 DIAGNOSIS — F3289 Other specified depressive episodes: Secondary | ICD-10-CM | POA: Diagnosis not present

## 2018-11-28 DIAGNOSIS — I951 Orthostatic hypotension: Secondary | ICD-10-CM | POA: Diagnosis not present

## 2018-11-28 DIAGNOSIS — M79604 Pain in right leg: Secondary | ICD-10-CM

## 2018-11-28 DIAGNOSIS — F329 Major depressive disorder, single episode, unspecified: Secondary | ICD-10-CM

## 2018-11-28 DIAGNOSIS — M79605 Pain in left leg: Secondary | ICD-10-CM

## 2018-11-28 DIAGNOSIS — M8949 Other hypertrophic osteoarthropathy, multiple sites: Secondary | ICD-10-CM

## 2018-11-28 DIAGNOSIS — M150 Primary generalized (osteo)arthritis: Secondary | ICD-10-CM | POA: Diagnosis not present

## 2018-11-28 DIAGNOSIS — J9611 Chronic respiratory failure with hypoxia: Secondary | ICD-10-CM | POA: Diagnosis not present

## 2018-11-28 DIAGNOSIS — F411 Generalized anxiety disorder: Secondary | ICD-10-CM | POA: Diagnosis not present

## 2018-11-28 DIAGNOSIS — M25561 Pain in right knee: Secondary | ICD-10-CM

## 2018-11-28 DIAGNOSIS — I10 Essential (primary) hypertension: Secondary | ICD-10-CM | POA: Diagnosis not present

## 2018-11-28 DIAGNOSIS — R296 Repeated falls: Secondary | ICD-10-CM

## 2018-11-28 DIAGNOSIS — N39498 Other specified urinary incontinence: Secondary | ICD-10-CM | POA: Diagnosis not present

## 2018-11-28 DIAGNOSIS — M545 Low back pain: Secondary | ICD-10-CM

## 2018-11-28 DIAGNOSIS — J45909 Unspecified asthma, uncomplicated: Secondary | ICD-10-CM | POA: Diagnosis not present

## 2018-11-28 DIAGNOSIS — F0280 Dementia in other diseases classified elsewhere without behavioral disturbance: Secondary | ICD-10-CM

## 2018-11-28 DIAGNOSIS — G8929 Other chronic pain: Secondary | ICD-10-CM | POA: Diagnosis not present

## 2018-11-28 DIAGNOSIS — G309 Alzheimer's disease, unspecified: Secondary | ICD-10-CM

## 2018-11-28 DIAGNOSIS — M159 Polyosteoarthritis, unspecified: Secondary | ICD-10-CM

## 2018-11-28 NOTE — Assessment & Plan Note (Signed)
Tramadol F/u w/ortho

## 2018-11-28 NOTE — Assessment & Plan Note (Signed)
Worse In a w/c Tramadol prn  Potential benefits of a long term opioids use as well as potential risks (i.e. addiction risk, apnea etc) and complications (i.e. Somnolence, constipation and others) were explained to the patient and were aknowledged.

## 2018-11-28 NOTE — Assessment & Plan Note (Signed)
BP Readings from Last 3 Encounters:  11/28/18 126/72  11/01/18 132/64  09/04/18 (!) 160/90

## 2018-11-28 NOTE — Assessment & Plan Note (Signed)
In a w/c Not walking

## 2018-11-28 NOTE — Assessment & Plan Note (Addendum)
Dementia is worse and progressing.  She has severe difficulties with short-term memory.  She has been suffering with worsening apathy.  She has not been able to learn new things.  She has been losing all skills.  She has been not completely dependent on her  Helpers, her daughter for daily routine.  She is wheelchair-bound.  She has not been not able to walk without assistance. On Namenda

## 2018-11-28 NOTE — Progress Notes (Signed)
Subjective:  Patient ID: Sabrina Page, female    DOB: 12/07/1933  Age: 85 y.o. MRN: [id number]  CC: No chief complaint on file.   HPI Sabrina Page presents for dementia, LBP, anxiety, depression Doing worse in general Dementia is worse and progressing.  She has severe difficulties with short-term memory.  She has been suffering with worsening apathy.  She has not been able to learn new things.  She has been losing all skills.  She has been not completely dependent on her  Helpers, her daughter for daily routine.  She is wheelchair-bound.  She has not been not able to walk without assistance.  Outpatient Medications Prior to Visit  Medication Sig Dispense Refill  . ARIPiprazole (ABILIFY) 2 MG tablet TAKE 1 TABLET BY MOUTH EVERY DAY 90 tablet 1  . aspirin 81 MG EC tablet Take 1 tablet (81 mg total) by mouth 2 (two) times daily. 60 tablet 0  . B Complex-C (SUPER B COMPLEX PO) Take 1 tablet by mouth every morning.     . Cholecalciferol 1000 UNITS capsule Take 1,000 Units by mouth every morning.     . clotrimazole-betamethasone (LOTRISONE) cream Apply 1 application topically 2 (two) times daily. 45 g 1  . fludrocortisone (FLORINEF) 0.1 MG tablet TAKE 1 TABLET BY MOUTH EVERY DAY 90 tablet 3  . FLUoxetine (PROZAC) 40 MG capsule TAKE 1 CAPSULE BY MOUTH EVERY DAY 90 capsule 3  . ipratropium-albuterol (DUONEB) 0.5-2.5 (3) MG/3ML SOLN 1 neb every 6 hours if needed 75 mL 12  . loratadine (CLARITIN) 10 MG tablet TAKE 1 TABLET BY MOUTH EVERY DAY IN THE MORNING 90 tablet 3  . memantine (NAMENDA) 10 MG tablet TAKE 1 TABLET BY MOUTH TWICE A DAY 180 tablet 3  . midodrine (PROAMATINE) 10 MG tablet Take 5mg  in the morning (half-tab) and 10mg  in the evening. 270 tablet 3  . Omega-3 Fatty Acids (FISH OIL) 1000 MG CAPS Take 1 capsule by mouth every morning.     Marland Kitchen omeprazole (PRILOSEC) 20 MG capsule TAKE 2 CAPSULES BY MOUTH EVERY DAY 180 capsule 3  . potassium chloride (KLOR-CON) 8 MEQ tablet  TAKE 1 TABLET (8 MEQ TOTAL) BY MOUTH EVERY MORNING. 90 tablet 3  . pyridostigmine (MESTINON) 60 MG tablet Take 1 tablet (60 mg total) by mouth 3 (three) times daily. 270 tablet 3  . traMADol (ULTRAM) 50 MG tablet Take 1 tablet (50 mg total) by mouth every 12 (twelve) hours as needed for severe pain. 100 tablet 2   No facility-administered medications prior to visit.     ROS: Review of Systems  Constitutional: Positive for fatigue. Negative for activity change, appetite change, chills and unexpected weight change.  HENT: Negative for congestion, mouth sores and sinus pressure.   Eyes: Negative for visual disturbance.  Respiratory: Negative for cough and chest tightness.   Gastrointestinal: Negative for abdominal pain and nausea.  Genitourinary: Negative for difficulty urinating, frequency and vaginal pain.  Musculoskeletal: Positive for arthralgias, back pain and gait problem.  Skin: Negative for pallor and rash.  Neurological: Negative for dizziness, tremors, weakness, numbness and headaches.  Psychiatric/Behavioral: Positive for confusion, decreased concentration, dysphoric mood and sleep disturbance. Negative for suicidal ideas. The patient is nervous/anxious.     Objective:  BP 126/72 (BP Location: Left Arm, Patient Position: Sitting, Cuff Size: Large)   Pulse 73   Temp 98.1 F (36.7 C) (Oral)   Ht 5\' 4"  (1.626 m)   Wt 243 lb (110.2 kg)   SpO2 93%  BMI 41.71 kg/m   BP Readings from Last 3 Encounters:  11/28/18 126/72  11/01/18 132/64  09/04/18 (!) 160/90    Wt Readings from Last 3 Encounters:  11/28/18 243 lb (110.2 kg)  11/01/18 246 lb (111.6 kg)  09/04/18 240 lb (108.9 kg)    Physical Exam Constitutional:      General: She is not in acute distress.    Appearance: She is well-developed.  HENT:     Head: Normocephalic.     Right Ear: External ear normal.     Left Ear: External ear normal.     Nose: Nose normal.  Eyes:     General:        Right eye: No  discharge.        Left eye: No discharge.     Conjunctiva/sclera: Conjunctivae normal.     Pupils: Pupils are equal, round, and reactive to light.  Neck:     Musculoskeletal: Normal range of motion and neck supple.     Thyroid: No thyromegaly.     Vascular: No JVD.     Trachea: No tracheal deviation.  Cardiovascular:     Rate and Rhythm: Normal rate and regular rhythm.     Heart sounds: Normal heart sounds.  Pulmonary:     Effort: No respiratory distress.     Breath sounds: No stridor. No wheezing.  Abdominal:     General: Bowel sounds are normal. There is no distension.     Palpations: Abdomen is soft. There is no mass.     Tenderness: There is no abdominal tenderness. There is no guarding or rebound.  Musculoskeletal:        General: No tenderness.  Lymphadenopathy:     Cervical: No cervical adenopathy.  Skin:    Findings: No erythema or rash.  Neurological:     Mental Status: She is disoriented.     Cranial Nerves: No cranial nerve deficit.     Motor: No abnormal muscle tone.     Coordination: Coordination abnormal.     Deep Tendon Reflexes: Reflexes normal.  Psychiatric:        Mood and Affect: Mood normal.        Behavior: Behavior normal.        Thought Content: Thought content normal.        Judgment: Judgment normal.   sad In a w/c  FTF>40 min during physical assessment, planning and filling out paperwork forms for home assistance   Lab Results  Component Value Date   WBC 5.1 12/21/2017   HGB 13.9 12/21/2017   HCT 42.6 12/21/2017   PLT 183.0 12/21/2017   GLUCOSE 154 (H) 11/01/2018   CHOL 270 (H) 06/12/2014   TRIG 132.0 06/12/2014   HDL 52.40 06/12/2014   LDLDIRECT 159.1 05/16/2012   LDLCALC 191 (H) 06/12/2014   ALT 13 07/24/2018   AST 13 07/24/2018   NA 138 11/01/2018   K 4.0 11/01/2018   CL 103 11/01/2018   CREATININE 0.91 11/01/2018   BUN 14 11/01/2018   CO2 29 11/01/2018   TSH 1.99 07/24/2018   INR 1.07 04/16/2016   HGBA1C 5.7 11/01/2018     Dg Chest 2 View  Result Date: 12/28/2017 CLINICAL DATA:  Chest pain and right flank pain secondary to a fall 7 days ago. EXAM: CHEST - 2 VIEW COMPARISON:  Rib radiographs dated 12/28/2017 and chest x-ray dated 04/18/2016 FINDINGS: The heart size and mediastinal contours are within normal limits. Both lungs are clear. The visualized  skeletal structures are unremarkable. IMPRESSION: No active cardiopulmonary disease. Electronically Signed   By: Lorriane Shire M.D.   On: 12/28/2017 10:45   Dg Ribs Unilateral Right  Result Date: 12/28/2017 CLINICAL DATA:  Status post fall 7 days ago with right rib pain. EXAM: RIGHT RIBS - 2 VIEW COMPARISON:  April 18, 2016 FINDINGS: No fracture or other bone lesions are seen involving the ribs. Prior cholecystectomy clips are noted. IMPRESSION: No acute fracture or dislocation of right ribs. Electronically Signed   By: Abelardo Diesel M.D.   On: 12/28/2017 10:44    Assessment & Plan:    [name], MD

## 2018-11-28 NOTE — Assessment & Plan Note (Signed)
Off O2 

## 2018-11-28 NOTE — Assessment & Plan Note (Signed)
Worse 

## 2018-11-28 NOTE — Assessment & Plan Note (Signed)
Chronic. 

## 2018-12-01 NOTE — Assessment & Plan Note (Signed)
Worse.  Continue with the fluoxetine.  On Abilify

## 2018-12-01 NOTE — Assessment & Plan Note (Signed)
On fluoxetine and Abilify

## 2018-12-20 ENCOUNTER — Telehealth: Payer: Self-pay | Admitting: Internal Medicine

## 2018-12-20 NOTE — Telephone Encounter (Signed)
Patient's daughter Sabrina Page calling to check and see if forms left with Dr. Alain Marion on 11/28/18 was completed at sent back to [facility] to extend home aid hours?  Fax: [phone]

## 2018-12-21 ENCOUNTER — Ambulatory Visit: Payer: Medicare Other | Admitting: Internal Medicine

## 2018-12-21 NOTE — Telephone Encounter (Signed)
We did a long time ago.  Thanks

## 2018-12-22 NOTE — Telephone Encounter (Signed)
I called Sabrina Page and spoke to [place]. She states the forms have not been received and is faxing forms back to [name] for completion.

## 2018-12-22 NOTE — Telephone Encounter (Signed)
Left message for [facility] to call me back to see about getting more forms if they did not receive them.

## 2019-01-18 ENCOUNTER — Other Ambulatory Visit: Payer: Self-pay | Admitting: Internal Medicine

## 2019-01-18 ENCOUNTER — Other Ambulatory Visit (INDEPENDENT_AMBULATORY_CARE_PROVIDER_SITE_OTHER): Payer: Medicare Other

## 2019-01-18 ENCOUNTER — Ambulatory Visit (INDEPENDENT_AMBULATORY_CARE_PROVIDER_SITE_OTHER): Payer: Medicare Other | Admitting: Internal Medicine

## 2019-01-18 ENCOUNTER — Ambulatory Visit (INDEPENDENT_AMBULATORY_CARE_PROVIDER_SITE_OTHER)
Admission: RE | Admit: 2019-01-18 | Discharge: 2019-01-18 | Disposition: A | Discharge status: Home / Self Care | Payer: Medicare Other | Source: Ambulatory Visit | Attending: Internal Medicine | Admitting: Internal Medicine

## 2019-01-18 ENCOUNTER — Other Ambulatory Visit: Payer: Self-pay

## 2019-01-18 ENCOUNTER — Encounter: Payer: Self-pay | Admitting: Internal Medicine

## 2019-01-18 VITALS — BP 130/90 | HR 76 | Temp 98.6°F | Ht 64.0 in | Wt 242.0 lb

## 2019-01-18 DIAGNOSIS — R109 Unspecified abdominal pain: Secondary | ICD-10-CM | POA: Diagnosis not present

## 2019-01-18 DIAGNOSIS — F0280 Dementia in other diseases classified elsewhere without behavioral disturbance: Secondary | ICD-10-CM | POA: Diagnosis not present

## 2019-01-18 DIAGNOSIS — G309 Alzheimer's disease, unspecified: Secondary | ICD-10-CM

## 2019-01-18 DIAGNOSIS — I10 Essential (primary) hypertension: Secondary | ICD-10-CM

## 2019-01-18 DIAGNOSIS — R0781 Pleurodynia: Secondary | ICD-10-CM

## 2019-01-18 DIAGNOSIS — K5900 Constipation, unspecified: Secondary | ICD-10-CM | POA: Diagnosis not present

## 2019-01-18 DIAGNOSIS — G8929 Other chronic pain: Secondary | ICD-10-CM | POA: Diagnosis not present

## 2019-01-18 DIAGNOSIS — M25561 Pain in right knee: Secondary | ICD-10-CM

## 2019-01-18 DIAGNOSIS — M545 Low back pain: Secondary | ICD-10-CM | POA: Diagnosis not present

## 2019-01-18 DIAGNOSIS — M79604 Pain in right leg: Secondary | ICD-10-CM

## 2019-01-18 DIAGNOSIS — M79605 Pain in left leg: Secondary | ICD-10-CM

## 2019-01-18 LAB — CBC WITH DIFFERENTIAL/PLATELET
Basophils Absolute: 0 10*3/uL (ref 0.0–0.1)
Basophils Relative: 0.2 % (ref 0.0–3.0)
Eosinophils Absolute: 0.3 10*3/uL (ref 0.0–0.7)
Eosinophils Relative: 4.2 % (ref 0.0–5.0)
HCT: 42.7 % (ref 36.0–46.0)
Hemoglobin: 14.1 g/dL (ref 12.0–15.0)
Lymphocytes Relative: 23.9 % (ref 12.0–46.0)
Lymphs Abs: 1.7 10*3/uL (ref 0.7–4.0)
MCHC: 32.9 g/dL (ref 30.0–36.0)
MCV: 89.4 fl (ref 78.0–100.0)
Monocytes Absolute: 0.6 10*3/uL (ref 0.1–1.0)
Monocytes Relative: 8 % (ref 3.0–12.0)
Neutro Abs: 4.5 10*3/uL (ref 1.4–7.7)
Neutrophils Relative %: 63.7 % (ref 43.0–77.0)
Platelets: 243 10*3/uL (ref 150.0–400.0)
RBC: 4.78 Mil/uL (ref 3.87–5.11)
RDW: 14 % (ref 11.5–15.5)
WBC: 7.1 10*3/uL (ref 4.0–10.5)

## 2019-01-18 LAB — BASIC METABOLIC PANEL
BUN: 13 mg/dL (ref 6–23)
CO2: 30 mEq/L (ref 19–32)
Calcium: 9.1 mg/dL (ref 8.4–10.5)
Chloride: 102 mEq/L (ref 96–112)
Creatinine, Ser: 0.92 mg/dL (ref 0.40–1.20)
GFR: 58.26 mL/min — ABNORMAL LOW (ref >60.00)
Glucose, Bld: 109 mg/dL — ABNORMAL HIGH (ref 70–99)
Potassium: 4.5 mEq/L (ref 3.5–5.1)
Sodium: 139 mEq/L (ref 135–145)

## 2019-01-18 LAB — TSH: TSH: 2.09 u[IU]/mL (ref 0.35–4.50)

## 2019-01-18 MED ORDER — INDAPAMIDE 2.5 MG PO TABS: 2.5000 mg | ORAL_TABLET | Freq: Every day | ORAL | 3 refills | Status: DC

## 2019-01-18 MED ORDER — METHYLPREDNISOLONE ACETATE 40 MG/ML IJ SUSP
40.0000 mg | Freq: Once | INTRAMUSCULAR | Status: AC
  Administered 2019-01-18: 12:00:00 40 mg via INTRA_ARTICULAR

## 2019-01-18 NOTE — Addendum Note (Signed)
Addended by: Cresenciano Lick on: 01/18/2019 11:39 AM   Modules accepted: Orders

## 2019-01-18 NOTE — Assessment & Plan Note (Signed)
R flank pain Watch for rash UA, labs, CXR

## 2019-01-18 NOTE — Progress Notes (Signed)
Subjective:  Patient ID: Sabrina Page, female    DOB: May 22, 1934  Age: 85 y.o. MRN: [id number]  CC: No chief complaint on file.   HPI Sabrina Page presents for  R flank or R CP x3 days - poor historian. No fever C/o edema - worse NHP discussed  Outpatient Medications Prior to Visit  Medication Sig Dispense Refill   ARIPiprazole (ABILIFY) 2 MG tablet TAKE 1 TABLET BY MOUTH EVERY DAY 90 tablet 1   aspirin 81 MG EC tablet Take 1 tablet (81 mg total) by mouth 2 (two) times daily. 60 tablet 0   B Complex-C (SUPER B COMPLEX PO) Take 1 tablet by mouth every morning.      Cholecalciferol 1000 UNITS capsule Take 1,000 Units by mouth every morning.      clotrimazole-betamethasone (LOTRISONE) cream Apply 1 application topically 2 (two) times daily. 45 g 1   fludrocortisone (FLORINEF) 0.1 MG tablet TAKE 1 TABLET BY MOUTH EVERY DAY 90 tablet 3   FLUoxetine (PROZAC) 40 MG capsule TAKE 1 CAPSULE BY MOUTH EVERY DAY 90 capsule 3   ipratropium-albuterol (DUONEB) 0.5-2.5 (3) MG/3ML SOLN 1 neb every 6 hours if needed 75 mL 12   loratadine (CLARITIN) 10 MG tablet TAKE 1 TABLET BY MOUTH EVERY DAY IN THE MORNING 90 tablet 3   memantine (NAMENDA) 10 MG tablet TAKE 1 TABLET BY MOUTH TWICE A DAY 180 tablet 3   midodrine (PROAMATINE) 10 MG tablet Take 5mg  in the morning (half-tab) and 10mg  in the evening. 270 tablet 3   Omega-3 Fatty Acids (FISH OIL) 1000 MG CAPS Take 1 capsule by mouth every morning.      omeprazole (PRILOSEC) 20 MG capsule TAKE 2 CAPSULES BY MOUTH EVERY DAY 180 capsule 3   potassium chloride (KLOR-CON) 8 MEQ tablet TAKE 1 TABLET (8 MEQ TOTAL) BY MOUTH EVERY MORNING. 90 tablet 3   pyridostigmine (MESTINON) 60 MG tablet Take 1 tablet (60 mg total) by mouth 3 (three) times daily. 270 tablet 3   traMADol (ULTRAM) 50 MG tablet Take 1 tablet (50 mg total) by mouth every 12 (twelve) hours as needed for severe pain. 100 tablet 2   No facility-administered  medications prior to visit.     ROS: Review of Systems  Constitutional: Positive for fatigue. Negative for activity change, appetite change, chills and unexpected weight change.  HENT: Negative for congestion, mouth sores and sinus pressure.   Eyes: Negative for visual disturbance.  Respiratory: Negative for cough and chest tightness.   Cardiovascular: Positive for chest pain.  Gastrointestinal: Negative for abdominal pain and nausea.  Genitourinary: Positive for flank pain. Negative for difficulty urinating, frequency, urgency and vaginal pain.  Musculoskeletal: Positive for arthralgias, back pain and gait problem.  Skin: Negative for pallor and rash.  Neurological: Positive for weakness. Negative for dizziness, tremors, numbness and headaches.  Psychiatric/Behavioral: Negative for confusion, sleep disturbance and suicidal ideas.    Objective:  BP 130/90 (BP Location: Left Arm, Patient Position: Sitting, Cuff Size: Large)    Pulse 76    Temp 98.6 F (37 C) (Oral)    Ht 5\' 4"  (1.626 m)    Wt 242 lb (109.8 kg)    SpO2 95%    BMI 41.54 kg/m   BP Readings from Last 3 Encounters:  01/18/19 130/90  11/28/18 126/72  11/01/18 132/64    Wt Readings from Last 3 Encounters:  01/18/19 242 lb (109.8 kg)  11/28/18 243 lb (110.2 kg)  11/01/18 246 lb (111.6 kg)  Physical Exam Constitutional:      General: She is not in acute distress.    Appearance: She is well-developed.  HENT:     Head: Normocephalic.     Right Ear: External ear normal.     Left Ear: External ear normal.     Nose: Nose normal.  Eyes:     General:        Right eye: No discharge.        Left eye: No discharge.     Conjunctiva/sclera: Conjunctivae normal.     Pupils: Pupils are equal, round, and reactive to light.  Neck:     Musculoskeletal: Normal range of motion and neck supple.     Thyroid: No thyromegaly.     Vascular: No JVD.     Trachea: No tracheal deviation.  Cardiovascular:     Rate and Rhythm:  Normal rate and regular rhythm.     Heart sounds: Normal heart sounds.  Pulmonary:     Effort: No respiratory distress.     Breath sounds: No stridor. No wheezing.  Abdominal:     General: Bowel sounds are normal. There is no distension.     Palpations: Abdomen is soft. There is no mass.     Tenderness: There is no abdominal tenderness. There is no guarding or rebound.  Musculoskeletal:        General: No tenderness.  Lymphadenopathy:     Cervical: No cervical adenopathy.  Skin:    Findings: No erythema or rash.  Neurological:     Cranial Nerves: No cranial nerve deficit.     Motor: No abnormal muscle tone.     Coordination: Coordination normal.     Deep Tendon Reflexes: Reflexes normal.  Psychiatric:        Behavior: Behavior normal.        Thought Content: Thought content normal.        Judgment: Judgment normal.   1+ edema R knee w/pain    Procedure Note :     Procedure : Joint Injection,  R knee   Indication:  Joint osteoarthritis with refractory  chronic pain.   Risks including unsuccessful procedure , bleeding, infection, bruising, skin atrophy, "steroid flare-up" and others were explained to the patient in detail as well as the benefits. Informed consent was obtained and signed.   Tthe patient was placed in a comfortable position. Lateral approach was used. Skin was prepped with Betadine and alcohol  and anesthetized a cooling spray. Then, a 5 cc syringe with a 1.5 inch long 25-gauge needle was used for a joint injection.. The needle was advanced  Into the knee joint cavity. I aspirated a small amount of intra-articular fluid to confirm correct placement of the needle and injected the joint with 5 mL of 2% lidocaine and 40 mg of Depo-Medrol .  Band-Aid was applied.   Tolerated well. Complications: None. Good pain relief following the procedure.   Postprocedure instructions :    A Band-Aid should be left on for 12 hours. Injection therapy is not a cure itself. It is  used in conjunction with other modalities. You can use nonsteroidal anti-inflammatories like ibuprofen , hot and cold compresses. Rest is recommended in the next 24 hours. You need to report immediately  if fever, chills or any signs of infection develop.  Lab Results  Component Value Date   WBC 5.1 12/21/2017   HGB 13.9 12/21/2017   HCT 42.6 12/21/2017   PLT 183.0 12/21/2017   GLUCOSE 154 (  H) 11/01/2018   CHOL 270 (H) 06/12/2014   TRIG 132.0 06/12/2014   HDL 52.40 06/12/2014   LDLDIRECT 159.1 05/16/2012   LDLCALC 191 (H) 06/12/2014   ALT 13 07/24/2018   AST 13 07/24/2018   NA 138 11/01/2018   K 4.0 11/01/2018   CL 103 11/01/2018   CREATININE 0.91 11/01/2018   BUN 14 11/01/2018   CO2 29 11/01/2018   TSH 1.99 07/24/2018   INR 1.07 04/16/2016   HGBA1C 5.7 11/01/2018    Dg Chest 2 View  Result Date: 12/28/2017 CLINICAL DATA:  Chest pain and right flank pain secondary to a fall 7 days ago. EXAM: CHEST - 2 VIEW COMPARISON:  Rib radiographs dated 12/28/2017 and chest x-ray dated 04/18/2016 FINDINGS: The heart size and mediastinal contours are within normal limits. Both lungs are clear. The visualized skeletal structures are unremarkable. IMPRESSION: No active cardiopulmonary disease. Electronically Signed   By: Lorriane Shire M.D.   On: 12/28/2017 10:45   Dg Ribs Unilateral Right  Result Date: 12/28/2017 CLINICAL DATA:  Status post fall 7 days ago with right rib pain. EXAM: RIGHT RIBS - 2 VIEW COMPARISON:  April 18, 2016 FINDINGS: No fracture or other bone lesions are seen involving the ribs. Prior cholecystectomy clips are noted. IMPRESSION: No acute fracture or dislocation of right ribs. Electronically Signed   By: Abelardo Diesel M.D.   On: 12/28/2017 10:44    Assessment & Plan:   There are no diagnoses linked to this encounter.   No orders of the defined types were placed in this encounter.    Follow-up: No follow-ups on file.  [name], MD

## 2019-01-18 NOTE — Assessment & Plan Note (Signed)
Worse Will inject  

## 2019-01-18 NOTE — Patient Instructions (Signed)
Postprocedure instructions :    A Band-Aid should be left on for 12 hours. Injection therapy is not a cure itself. It is used in conjunction with other modalities. You can use nonsteroidal anti-inflammatories like ibuprofen , hot and cold compresses. Rest is recommended in the next 24 hours. You need to report immediately  if fever, chills or any signs of infection develop. 

## 2019-01-18 NOTE — Assessment & Plan Note (Signed)
Indapamide if worse (risk due to dizziness)

## 2019-01-18 NOTE — Assessment & Plan Note (Signed)
Worse 

## 2019-01-18 NOTE — Assessment & Plan Note (Signed)
Indapamide po (stopped) - re-start

## 2019-01-19 ENCOUNTER — Other Ambulatory Visit: Payer: Self-pay | Admitting: Internal Medicine

## 2019-01-19 LAB — URINALYSIS, ROUTINE W REFLEX MICROSCOPIC
Bilirubin Urine: NEGATIVE
Ketones, ur: NEGATIVE
Nitrite: NEGATIVE
Specific Gravity, Urine: 1.025 (ref 1.000–1.030)
Urine Glucose: NEGATIVE
Urobilinogen, UA: 4 — AB (ref 0.0–1.0)
pH: 6 (ref 5.0–8.0)

## 2019-01-19 LAB — D-DIMER, QUANTITATIVE: D-Dimer, Quant: 1.21 mcg/mL FEU — ABNORMAL HIGH (ref <0.50)

## 2019-01-19 MED ORDER — CEFUROXIME AXETIL 250 MG PO TABS: 250.0000 mg | ORAL_TABLET | Freq: Two times a day (BID) | ORAL | 1 refills | Status: DC

## 2019-02-02 ENCOUNTER — Telehealth: Payer: Self-pay

## 2019-02-02 NOTE — Telephone Encounter (Signed)
Copied from [place] [phone]. Topic: General - Other >> Feb 02, 2019 11:13 AM Keene Breath wrote: Reason for CRM: Patient's POA called to inform the nurse or doctor that the patient was treated for a UTI that seemed to clear up, but now it looks like it has come back.  Would like to know if there is any medication that can be called in or should the patient make an appt.  Please advise and call patient back at [phone]

## 2019-02-05 MED ORDER — CEPHALEXIN 500 MG PO CAPS: 500.0000 mg | ORAL_CAPSULE | Freq: Every day | ORAL | 1 refills | Status: DC

## 2019-02-05 NOTE — Telephone Encounter (Signed)
Pt daughter notified   

## 2019-02-05 NOTE — Telephone Encounter (Signed)
I will start the patient on prophylactic Keflex.  Start 1 capsule daily.  Thanks

## 2019-02-27 ENCOUNTER — Other Ambulatory Visit: Payer: Self-pay

## 2019-02-27 ENCOUNTER — Ambulatory Visit (INDEPENDENT_AMBULATORY_CARE_PROVIDER_SITE_OTHER): Payer: Medicare Other | Admitting: Internal Medicine

## 2019-02-27 ENCOUNTER — Other Ambulatory Visit: Payer: Medicare Other

## 2019-02-27 ENCOUNTER — Encounter: Payer: Self-pay | Admitting: Internal Medicine

## 2019-02-27 VITALS — BP 126/78 | HR 84 | Temp 98.3°F | Ht 64.0 in | Wt 238.0 lb

## 2019-02-27 DIAGNOSIS — N39490 Overflow incontinence: Secondary | ICD-10-CM

## 2019-02-27 DIAGNOSIS — M545 Low back pain: Secondary | ICD-10-CM

## 2019-02-27 DIAGNOSIS — I10 Essential (primary) hypertension: Secondary | ICD-10-CM | POA: Diagnosis not present

## 2019-02-27 DIAGNOSIS — G309 Alzheimer's disease, unspecified: Secondary | ICD-10-CM

## 2019-02-27 DIAGNOSIS — Z23 Encounter for immunization: Secondary | ICD-10-CM

## 2019-02-27 DIAGNOSIS — F0280 Dementia in other diseases classified elsewhere without behavioral disturbance: Secondary | ICD-10-CM

## 2019-02-27 DIAGNOSIS — M79604 Pain in right leg: Secondary | ICD-10-CM

## 2019-02-27 NOTE — Assessment & Plan Note (Signed)
UA, Cx 

## 2019-02-27 NOTE — Assessment & Plan Note (Signed)
Worse 

## 2019-02-27 NOTE — Assessment & Plan Note (Signed)
Worse Needs more help at home

## 2019-02-27 NOTE — Assessment & Plan Note (Signed)
Indapamide po (stopped) - re-start

## 2019-02-27 NOTE — Progress Notes (Addendum)
Subjective:  Patient ID: Sabrina Page, female    DOB: May 19, 1934  Age: 85 y.o. MRN: [id number]  CC: No chief complaint on file.   HPI Sabrina Page presents for dementia, hypotension, spinal stenosis f/u Her daughter Sabrina Page is complaining of her mom's progression of memory loss, dependence on other people for ADLs, worsening immobility and pain due to spinal stenosis and arthritis  Outpatient Medications Prior to Visit  Medication Sig Dispense Refill  . ARIPiprazole (ABILIFY) 2 MG tablet TAKE 1 TABLET BY MOUTH EVERY DAY 90 tablet 1  . aspirin 81 MG EC tablet Take 1 tablet (81 mg total) by mouth 2 (two) times daily. 60 tablet 0  . B Complex-C (SUPER B COMPLEX PO) Take 1 tablet by mouth every morning.     . cephALEXin (KEFLEX) 500 MG capsule Take 1 capsule (500 mg total) by mouth daily. 90 capsule 1  . Cholecalciferol 1000 UNITS capsule Take 1,000 Units by mouth every morning.     . clotrimazole-betamethasone (LOTRISONE) cream Apply 1 application topically 2 (two) times daily. 45 g 1  . fludrocortisone (FLORINEF) 0.1 MG tablet TAKE 1 TABLET BY MOUTH EVERY DAY 90 tablet 3  . FLUoxetine (PROZAC) 40 MG capsule TAKE 1 CAPSULE BY MOUTH EVERY DAY 90 capsule 3  . ipratropium-albuterol (DUONEB) 0.5-2.5 (3) MG/3ML SOLN 1 neb every 6 hours if needed 75 mL 12  . loratadine (CLARITIN) 10 MG tablet TAKE 1 TABLET BY MOUTH EVERY DAY IN THE MORNING 90 tablet 3  . memantine (NAMENDA) 10 MG tablet TAKE 1 TABLET BY MOUTH TWICE A DAY 180 tablet 3  . midodrine (PROAMATINE) 10 MG tablet Take 5mg  in the morning (half-tab) and 10mg  in the evening. 270 tablet 3  . Omega-3 Fatty Acids (FISH OIL) 1000 MG CAPS Take 1 capsule by mouth every morning.     Marland Kitchen omeprazole (PRILOSEC) 20 MG capsule TAKE 2 CAPSULES BY MOUTH EVERY DAY 180 capsule 3  . potassium chloride (KLOR-CON) 8 MEQ tablet TAKE 1 TABLET (8 MEQ TOTAL) BY MOUTH EVERY MORNING. 90 tablet 3  . pyridostigmine (MESTINON) 60 MG tablet Take 1  tablet (60 mg total) by mouth 3 (three) times daily. 270 tablet 3  . traMADol (ULTRAM) 50 MG tablet Take 1 tablet (50 mg total) by mouth every 12 (twelve) hours as needed for severe pain. 100 tablet 2   No facility-administered medications prior to visit.     ROS: Review of Systems  Constitutional: Positive for fatigue. Negative for activity change, appetite change, chills and unexpected weight change.  HENT: Negative for congestion, mouth sores and sinus pressure.   Eyes: Negative for visual disturbance.  Respiratory: Negative for cough and chest tightness.   Gastrointestinal: Negative for abdominal pain and nausea.  Genitourinary: Negative for difficulty urinating, frequency and vaginal pain.  Musculoskeletal: Positive for arthralgias, back pain, gait problem, myalgias and neck stiffness.  Skin: Negative for pallor and rash.  Neurological: Negative for dizziness, tremors, weakness, numbness and headaches.  Psychiatric/Behavioral: Positive for behavioral problems, confusion, decreased concentration and sleep disturbance. Negative for suicidal ideas. The patient is nervous/anxious.     Objective:  BP 126/78 (BP Location: Left Arm, Patient Position: Sitting, Cuff Size: Large)   Pulse 84   Temp 98.3 F (36.8 C) (Oral)   Ht 5\' 4"  (1.626 m)   Wt 238 lb (108 kg)   SpO2 94%   BMI 40.85 kg/m   BP Readings from Last 3 Encounters:  02/27/19 126/78  01/18/19 130/90  11/28/18 126/72  Wt Readings from Last 3 Encounters:  02/27/19 238 lb (108 kg)  01/18/19 242 lb (109.8 kg)  11/28/18 243 lb (110.2 kg)    Physical Exam Constitutional:      General: She is not in acute distress.    Appearance: She is well-developed. She is obese.  HENT:     Head: Normocephalic.     Right Ear: External ear normal.     Left Ear: External ear normal.     Nose: Nose normal.  Eyes:     General:        Right eye: No discharge.        Left eye: No discharge.     Conjunctiva/sclera: Conjunctivae  normal.     Pupils: Pupils are equal, round, and reactive to light.  Neck:     Musculoskeletal: Normal range of motion and neck supple.     Thyroid: No thyromegaly.     Vascular: No JVD.     Trachea: No tracheal deviation.  Cardiovascular:     Rate and Rhythm: Normal rate and regular rhythm.     Heart sounds: Normal heart sounds.  Pulmonary:     Effort: No respiratory distress.     Breath sounds: No stridor. No wheezing.  Abdominal:     General: Bowel sounds are normal. There is no distension.     Palpations: Abdomen is soft. There is no mass.     Tenderness: There is no abdominal tenderness. There is no guarding or rebound.  Musculoskeletal:        General: No tenderness.  Lymphadenopathy:     Cervical: No cervical adenopathy.  Skin:    Findings: No erythema or rash.  Neurological:     Mental Status: She is disoriented.     Cranial Nerves: No cranial nerve deficit.     Motor: Weakness present. No abnormal muscle tone.     Coordination: Coordination abnormal.     Gait: Gait abnormal.     Deep Tendon Reflexes: Reflexes normal.  Psychiatric:        Behavior: Behavior normal.        Thought Content: Thought content normal.        Judgment: Judgment normal.   LS painful In a w/c   Lab Results  Component Value Date   WBC 7.1 01/18/2019   HGB 14.1 01/18/2019   HCT 42.7 01/18/2019   PLT 243.0 01/18/2019   GLUCOSE 109 (H) 01/18/2019   CHOL 270 (H) 06/12/2014   TRIG 132.0 06/12/2014   HDL 52.40 06/12/2014   LDLDIRECT 159.1 05/16/2012   LDLCALC 191 (H) 06/12/2014   ALT 13 07/24/2018   AST 13 07/24/2018   NA 139 01/18/2019   K 4.5 01/18/2019   CL 102 01/18/2019   CREATININE 0.92 01/18/2019   BUN 13 01/18/2019   CO2 30 01/18/2019   TSH 2.09 01/18/2019   INR 1.07 04/16/2016   HGBA1C 5.7 11/01/2018    Dg Abd 1 View  Result Date: 01/18/2019 CLINICAL DATA:  Right flank pain.  Constipation. EXAM: ABDOMEN - 1 VIEW COMPARISON:  None. FINDINGS: The bowel gas pattern is  normal. Status post cholecystectomy. Moderate amount of stool seen throughout the colon. No radio-opaque calculi or other significant radiographic abnormality are seen. IMPRESSION: No evidence of bowel obstruction or ileus. Moderate stool burden. No definite nephrolithiasis is noted. Electronically Signed   By: Marijo Conception M.D.   On: 01/18/2019 14:58    Assessment & Plan:   There are no diagnoses  linked to this encounter.   No orders of the defined types were placed in this encounter.    Follow-up: No follow-ups on file.  [name], MD

## 2019-03-02 ENCOUNTER — Other Ambulatory Visit (INDEPENDENT_AMBULATORY_CARE_PROVIDER_SITE_OTHER): Payer: Medicare Other

## 2019-03-02 DIAGNOSIS — N39490 Overflow incontinence: Secondary | ICD-10-CM | POA: Diagnosis not present

## 2019-03-02 LAB — URINALYSIS, ROUTINE W REFLEX MICROSCOPIC
Bilirubin Urine: NEGATIVE
Hgb urine dipstick: NEGATIVE
Ketones, ur: NEGATIVE
Leukocytes,Ua: NEGATIVE
Nitrite: POSITIVE — AB
RBC / HPF: NONE SEEN (ref 0–?)
Specific Gravity, Urine: 1.025 (ref 1.000–1.030)
Total Protein, Urine: NEGATIVE
Urine Glucose: NEGATIVE
Urobilinogen, UA: 0.2 (ref 0.0–1.0)
WBC, UA: NONE SEEN (ref 0–?)
pH: 6 (ref 5.0–8.0)

## 2019-03-03 ENCOUNTER — Other Ambulatory Visit: Payer: Self-pay | Admitting: Internal Medicine

## 2019-03-03 MED ORDER — FLUCONAZOLE 150 MG PO TABS: 150.0000 mg | ORAL_TABLET | Freq: Once | ORAL | 1 refills | Status: AC

## 2019-03-04 LAB — CULTURE, URINE COMPREHENSIVE
MICRO NUMBER:: 973983
SPECIMEN QUALITY:: ADEQUATE

## 2019-03-05 ENCOUNTER — Ambulatory Visit: Payer: Self-pay | Admitting: Internal Medicine

## 2019-03-05 DIAGNOSIS — R627 Adult failure to thrive: Secondary | ICD-10-CM

## 2019-03-05 DIAGNOSIS — M79604 Pain in right leg: Secondary | ICD-10-CM

## 2019-03-05 DIAGNOSIS — F0280 Dementia in other diseases classified elsewhere without behavioral disturbance: Secondary | ICD-10-CM

## 2019-03-05 DIAGNOSIS — I951 Orthostatic hypotension: Secondary | ICD-10-CM

## 2019-03-05 DIAGNOSIS — M545 Low back pain: Secondary | ICD-10-CM

## 2019-03-05 NOTE — Telephone Encounter (Signed)
Pt's daughter [name] calling, on [place].   States seeing a steady decline in mother's condition, "Especially over last 2 weeks."  Reports now requires 2+ mod assist to stand. States increased confusion, more delayed, flat affect, max cues for tasks. Reports pt taking antibiotic for UTI, "Changed once, now on one for 90 days." Pt is incontinent urine.  Reports increased knee pain (arthritic)so she has been giving her mother 1 tramadol each HS. Also reports she discovered pt had been taking evening meds twice (double dose)  in recent days. Reports orthostatic BPs, "But that is not new." Daughter requesting referral to [facility] or agency for ATC care. States very difficult to provide care and pt now at point where she feels 24/7 care is needed.   Please advise: [name] # [phone].

## 2019-03-06 ENCOUNTER — Other Ambulatory Visit: Payer: Self-pay | Admitting: Internal Medicine

## 2019-03-06 MED ORDER — FUROSEMIDE 40 MG PO TABS: 40.0000 mg | ORAL_TABLET | Freq: Every day | ORAL | 5 refills | Status: DC | PRN

## 2019-03-07 NOTE — Telephone Encounter (Signed)
Furosemide emailed Cont current abx Thx

## 2019-03-08 NOTE — Addendum Note (Signed)
Addended by: Cassandria Anger on: 03/08/2019 01:47 PM   Modules accepted: Orders

## 2019-03-08 NOTE — Telephone Encounter (Signed)
Ok Will refer Thx

## 2019-03-08 NOTE — Telephone Encounter (Signed)
Daughter requesting referral to [facility] or agency for ATC care. States very difficult to provide care and pt now at point where she feels 24/7 care is needed.   Please advise about referral

## 2019-03-16 ENCOUNTER — Other Ambulatory Visit: Payer: Self-pay

## 2019-03-16 ENCOUNTER — Ambulatory Visit (INDEPENDENT_AMBULATORY_CARE_PROVIDER_SITE_OTHER): Payer: Medicare Other | Admitting: Neurology

## 2019-03-16 ENCOUNTER — Telehealth: Payer: Self-pay

## 2019-03-16 ENCOUNTER — Encounter: Payer: Self-pay | Admitting: Neurology

## 2019-03-16 VITALS — BP 110/65 | HR 80 | Temp 98.0°F | Ht 64.0 in | Wt 238.0 lb

## 2019-03-16 DIAGNOSIS — G309 Alzheimer's disease, unspecified: Secondary | ICD-10-CM

## 2019-03-16 DIAGNOSIS — F0280 Dementia in other diseases classified elsewhere without behavioral disturbance: Secondary | ICD-10-CM

## 2019-03-16 DIAGNOSIS — I951 Orthostatic hypotension: Secondary | ICD-10-CM | POA: Diagnosis not present

## 2019-03-16 MED ORDER — MIDODRINE HCL 10 MG PO TABS: 5.0000 mg | ORAL_TABLET | Freq: Two times a day (BID) | ORAL | 3 refills | Status: DC

## 2019-03-16 NOTE — Telephone Encounter (Signed)
Sent referral to hospice of the [facility] palliative services for patient to [phone].

## 2019-03-16 NOTE — Patient Instructions (Signed)
Reduce midodrine to 5mg  in the morning and bed time  We will refer you to palliative care  Return to clinic in 6 months

## 2019-03-16 NOTE — Progress Notes (Signed)
Follow-up Visit   Date: 03/16/19    Sabrina Page MRN: [id number] DOB: 09-12-1933   Interim History: Sabrina Page is a 85 y.o.  female with ashtma, GERD, depression, hyperlipidemia, CKD, and peripheral vascular disease returning to the clinic for follow-up of orthostasis and Alzheimer's dementia.  The patient was accompanied to the clinic by daughter who is translating.  [place] Interpretor was offered and declined.   She has a long history of orthostatic hypotension starting in the spring 2017.  Midodrine 10 mg twice daily was started which helped.  Over the next year, Florinef 0.1 mg daily and Mestinon 60 mg 3 times daily was also added.  Overall, the combination of these medications has helped her orthostasis, however in 2020 she began having supine hypertension.  At her last visit, midodrine was reduced to 5 mg in the morning and 10 mg at bedtime.  Daughter has noticed that her blood pressure was staying high at bedtime with systolic BP ranging around 160s.  Starting around 2016, patient began having memory loss which has progressed over the past few years.  Currently, she is highly dependent for nearly all ADLs, except for feeding.  Her daughter is POA and manages medications and finances.  Mood and sleep is good.  She has 3 caregivers that comes throughout the day, for a total of 8 hours daily.  They assist with bathing, dressing, meals, and personal hygiene.  Daughter is getting very concerned at the progression of her dementia and overall debility and feels that she needs greater in-home care.  In the past, we have also discussed transferring into skilled nursing facility, which they have thought about but preferred to keep her at home, especially because patient's husband also has suffers from dementia. She has difficulty with walking due to right knee pain.  She is being treatment for UTI.    Medications:  Current Outpatient Medications on File Prior to Visit   Medication Sig Dispense Refill  . ARIPiprazole (ABILIFY) 2 MG tablet TAKE 1 TABLET BY MOUTH EVERY DAY 90 tablet 1  . aspirin 81 MG EC tablet Take 1 tablet (81 mg total) by mouth 2 (two) times daily. 60 tablet 0  . B Complex-C (SUPER B COMPLEX PO) Take 1 tablet by mouth every morning.     . cephALEXin (KEFLEX) 500 MG capsule Take 1 capsule (500 mg total) by mouth daily. 90 capsule 1  . Cholecalciferol 1000 UNITS capsule Take 1,000 Units by mouth every morning.     . clotrimazole-betamethasone (LOTRISONE) cream Apply 1 application topically 2 (two) times daily. 45 g 1  . fludrocortisone (FLORINEF) 0.1 MG tablet TAKE 1 TABLET BY MOUTH EVERY DAY 90 tablet 3  . FLUoxetine (PROZAC) 40 MG capsule TAKE 1 CAPSULE BY MOUTH EVERY DAY 90 capsule 3  . furosemide (LASIX) 40 MG tablet Take 1 tablet (40 mg total) by mouth daily as needed for edema. 30 tablet 5  . ipratropium-albuterol (DUONEB) 0.5-2.5 (3) MG/3ML SOLN 1 neb every 6 hours if needed 75 mL 12  . loratadine (CLARITIN) 10 MG tablet TAKE 1 TABLET BY MOUTH EVERY DAY IN THE MORNING 90 tablet 3  . memantine (NAMENDA) 10 MG tablet TAKE 1 TABLET BY MOUTH TWICE A DAY 180 tablet 3  . Omega-3 Fatty Acids (FISH OIL) 1000 MG CAPS Take 1 capsule by mouth every morning.     Marland Kitchen omeprazole (PRILOSEC) 20 MG capsule TAKE 2 CAPSULES BY MOUTH EVERY DAY 180 capsule 3  . potassium chloride (KLOR-CON)  8 MEQ tablet TAKE 1 TABLET (8 MEQ TOTAL) BY MOUTH EVERY MORNING. 90 tablet 3  . pyridostigmine (MESTINON) 60 MG tablet Take 1 tablet (60 mg total) by mouth 3 (three) times daily. 270 tablet 3  . traMADol (ULTRAM) 50 MG tablet Take 1 tablet (50 mg total) by mouth every 12 (twelve) hours as needed for severe pain. 100 tablet 2   No current facility-administered medications on file prior to visit.     Allergies:  Allergies  Allergen Reactions  . Atorvastatin     REACTION: upset stomach  . Enalapril Maleate   . Hctz [Hydrochlorothiazide]     Dizzy   . Lovastatin      REACTION: tongue stress  . Namenda [Memantine Hcl]     Feeling bad  . Simvastatin     REACTION: mouth sores  . Topamax [Topiramate]     syncope    Review of Systems:  CONSTITUTIONAL: No fevers, chills, night sweats, or weight loss.  EYES: No visual changes or eye pain ENT: No hearing changes.  No history of nose bleeds.   RESPIRATORY: No cough, wheezing and shortness of breath.   CARDIOVASCULAR: Negative for chest pain, and palpitations.   GI: Negative for abdominal discomfort, blood in stools or black stools.  No recent change in bowel habits.   GU:  No history of incontinence.   MUSCLOSKELETAL: No history of joint pain or swelling.  No myalgias.   SKIN: Negative for lesions, rash, and itching.   ENDOCRINE: Negative for cold or heat intolerance, polydipsia or goiter.   PSYCH:  No depression or anxiety symptoms.   NEURO: As Above.   Vital Signs:  BP 110/65   Pulse 80   Temp 98 F (36.7 C)   Ht '5\' 4"'$  (1.626 m)   Wt 238 lb (108 kg)   SpO2 95%   BMI 40.85 kg/m   General Medical Exam:   General:  Well appearing, comfortable   Neurological Exam: MENTAL STATUS including orientation to time and country is intact.  She correctly identifies self and daughter.  She does not not know the year or president.  She follows simple commands.  She does not engage in conversation.  CRANIAL NERVES:  Pupils equal round and reactive to light.  Normal conjugate, extra-ocular eye movements in all directions of gaze.  No ptosis.   MOTOR:  Motor strength is 5/5 in all extremities.   COORDINATION/GAIT: Gait was not tested, patient arrived in wheelchair  Data: US carotids 07/28/2015:  1-39% bilateral ICA stenosis  Labs 02/17/2016:  vitamin B12 556, vitamin B6 32.7,  SSA/B neg, copper 109, SPEP with IFE no M protein, ESR 15  MRI brain wo contrast 03/02/2016:  No acute infarct or intracranial hemorrhage.  Mild chronic microvascular changes. Moderate global atrophy. Ventricular prominence probably  secondary to central atrophy rather than hydrocephalus. No intracranial mass lesion noted on this unenhanced exam. Mild cervical spondylotic changes C4-5 incompletely assessed.  IMPRESSION/PLAN: 1.  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  Stable   - Reduce midodrine to 5 mg twice daily due to supine hypertension   - Continue Florinef 1 mg daily   - Continue mestinon '60mg'$  TID              - Encouraged to use rollator at all times when walking              - Encouraged compliance with wearing knee high compression stockings  2.  Alzheimer's dementia without behavior  changes, worsening as expected with the course of the disease.  I had a lengthy discussion with patient discussing options of increasing caregiver support within her home, or transition into skilled nursing facility.  Daughter would like to keep her mother at home, as long as she is able to do so.  With her multiple chronic medical comorbidities, she is a good candidate for palliative care services.  Daughter is in agreement and referral will be placed.  Continue Namenda 10 mg twice daily  Greater than 50% of this 30 minute visit was spent in counseling, explanation of diagnosis, planning of further management, and coordination of care.   Thank you for allowing me to participate in patient's care.  If I can answer any additional questions, I would be pleased to do so.    Sincerely,     K. Posey Pronto, DO

## 2019-03-16 NOTE — Telephone Encounter (Signed)
-----   Message from Alda Berthold, DO sent at 03/16/2019  9:40 AM EDT ----- Please send palliative care referral to [facility] - request palliative services, not hospice, for Alzheimer's dementia.  Thanks.

## 2019-03-18 ENCOUNTER — Other Ambulatory Visit: Payer: Self-pay | Admitting: Internal Medicine

## 2019-03-19 NOTE — Telephone Encounter (Signed)
Close encounter 

## 2019-03-29 ENCOUNTER — Telehealth: Payer: Self-pay

## 2019-03-29 NOTE — Telephone Encounter (Signed)
Copied from [place] [id number]. Topic: General - Other >> Mar 29, 2019  2:28 PM Celene Kras A wrote: Reason for CRM: Pts daughter called stating pt is needing a note stating that it is necessary for pt to have television antenna for her to receive [place] tv as the pt has dementia. She also states pt fell last Friday. Please advise.

## 2019-03-30 NOTE — Telephone Encounter (Signed)
Letter written and printed for provider to sign

## 2019-03-30 NOTE — Telephone Encounter (Addendum)
Please write a letter indicating that watching TV will provide her comfort.  Thank you

## 2019-04-05 NOTE — Telephone Encounter (Signed)
LM notifying daughter letter ready to pick up

## 2019-04-27 ENCOUNTER — Ambulatory Visit (INDEPENDENT_AMBULATORY_CARE_PROVIDER_SITE_OTHER): Payer: Medicare Other | Admitting: Family

## 2019-04-27 ENCOUNTER — Encounter: Payer: Self-pay | Admitting: Family

## 2019-04-27 DIAGNOSIS — J4540 Moderate persistent asthma, uncomplicated: Secondary | ICD-10-CM | POA: Diagnosis not present

## 2019-04-27 DIAGNOSIS — N390 Urinary tract infection, site not specified: Secondary | ICD-10-CM | POA: Diagnosis not present

## 2019-04-27 DIAGNOSIS — I509 Heart failure, unspecified: Secondary | ICD-10-CM | POA: Diagnosis not present

## 2019-04-27 MED ORDER — DOXYCYCLINE HYCLATE 100 MG PO TABS: 100.0000 mg | ORAL_TABLET | Freq: Two times a day (BID) | ORAL | 0 refills | Status: DC

## 2019-04-27 NOTE — Progress Notes (Signed)
Sabrina Page is a 85 y.o. female with the following history as recorded in EpicCare:  Patient Active Problem List   Diagnosis Date Noted  . Urinary incontinence 11/01/2018  . Seborrheic dermatitis 06/14/2018  . Chronic respiratory failure with hypoxia ([place]) 04/14/2018  . Alzheimer disease ([place]) 06/10/2017  . Orthostatic hypotension dysautonomic syndrome ([place]) 03/20/2017  . Knee pain, right 12/27/2016  . Fall   . Closed right hip fracture ([place]) 04/15/2016  . Hip fracture ([place]) 04/15/2016  . Syncope due to orthostatic hypotension 01/18/2016  . Orthostatic hypotension 01/18/2016  . Hip pain, chronic 12/25/2015  . Carotid bruit 06/26/2015  . Fall at home 02/01/2015  . Concussion without loss of consciousness 09/13/2014  . Morbid obesity ([place]) 03/06/2014  . Right tennis elbow 01/02/2014  . Wart 01/02/2014  . Chronic fatigue 06/25/2013  . External hemorrhoid, bleeding 02/14/2013  . Obstructive sleep apnea 02/10/2012  . Low back pain radiating to both legs 05/14/2011  . Falls frequently 05/14/2011  . Fatigue 04/22/2011  . Hyperglycemia 04/22/2011  . Neoplasm of uncertain behavior of skin 01/11/2011  . Anxiety state 07/06/2010  . SHOULDER PAIN 05/21/2010  . CONCUSSION WITH LOSS OF CONSCIOUSNESS 05/21/2010  . Actinic keratosis 04/08/2010  . CARPAL TUNNEL SYNDROME 11/28/2009  . NECK PAIN 11/28/2009  . DIZZINESS 11/28/2009  . INSOMNIA, CHRONIC 06/23/2009  . TOBACCO USE, QUIT 04/11/2009  . DIVERTICULOSIS OF COLON 09/06/2008  . DIVERTICULITIS OF COLON 09/06/2008  . Unspecified chest pain 09/06/2008  . GERD 06/05/2008  . Osteoarthritis 06/05/2008  . ABNORMAL GLUCOSE NEC 06/05/2008  . BRONCHITIS, ACUTE 04/04/2008  . APHTHOUS STOMATITIS 03/25/2008  . Venous (peripheral) insufficiency 11/03/2007  . Edema 11/03/2007  . SHINGLES 10/23/2007  . DENTAL PAIN 10/23/2007  . PARESTHESIA 10/23/2007  . Pain in limb 07/06/2007  . HYPERLIPIDEMIA 02/17/2007  . Depression 02/17/2007   . Essential hypertension 02/17/2007  . ALLERGIC RHINITIS 02/17/2007  . Asthma, moderate persistent, well-controlled 02/17/2007  . Dyspnea on exertion 02/17/2007  . SYMPTOM, INCONTINENCE, URGE 02/17/2007    Current Outpatient Medications  Medication Sig Dispense Refill  . ARIPiprazole (ABILIFY) 2 MG tablet TAKE 1 TABLET BY MOUTH EVERY DAY 90 tablet 1  . aspirin 81 MG EC tablet Take 1 tablet (81 mg total) by mouth 2 (two) times daily. 60 tablet 0  . B Complex-C (SUPER B COMPLEX PO) Take 1 tablet by mouth every morning.     . Cholecalciferol 1000 UNITS capsule Take 1,000 Units by mouth every morning.     . clotrimazole-betamethasone (LOTRISONE) cream Apply 1 application topically 2 (two) times daily. 45 g 1  . doxycycline (VIBRA-TABS) 100 MG tablet Take 1 tablet (100 mg total) by mouth 2 (two) times daily. 20 tablet 0  . fludrocortisone (FLORINEF) 0.1 MG tablet TAKE 1 TABLET BY MOUTH EVERY DAY 90 tablet 3  . FLUoxetine (PROZAC) 40 MG capsule TAKE 1 CAPSULE BY MOUTH EVERY DAY 90 capsule 3  . furosemide (LASIX) 40 MG tablet Take 1 tablet (40 mg total) by mouth daily as needed for edema. 30 tablet 5  . ipratropium-albuterol (DUONEB) 0.5-2.5 (3) MG/3ML SOLN 1 neb every 6 hours if needed 75 mL 12  . loratadine (CLARITIN) 10 MG tablet TAKE 1 TABLET BY MOUTH EVERY DAY IN THE MORNING 90 tablet 3  . memantine (NAMENDA) 10 MG tablet TAKE 1 TABLET BY MOUTH TWICE A DAY 180 tablet 3  . midodrine (PROAMATINE) 10 MG tablet Take 0.5 tablets (5 mg total) by mouth 2 (two) times daily. 90 tablet 3  .  Omega-3 Fatty Acids (FISH OIL) 1000 MG CAPS Take 1 capsule by mouth every morning.     Marland Kitchen omeprazole (PRILOSEC) 20 MG capsule TAKE 2 CAPSULES BY MOUTH EVERY DAY 180 capsule 3  . potassium chloride (KLOR-CON) 8 MEQ tablet TAKE 1 TABLET (8 MEQ TOTAL) BY MOUTH EVERY MORNING. 90 tablet 3  . pyridostigmine (MESTINON) 60 MG tablet Take 1 tablet (60 mg total) by mouth 3 (three) times daily. 270 tablet 3  . traMADol  (ULTRAM) 50 MG tablet Take 1 tablet (50 mg total) by mouth every 12 (twelve) hours as needed for severe pain. 100 tablet 2   No current facility-administered medications for this visit.     Allergies: Atorvastatin, Enalapril maleate, Hctz [hydrochlorothiazide], Lovastatin, Namenda [memantine hcl], Simvastatin, and Topamax [topiramate]  Past Medical History:  Diagnosis Date  . Allergy    rhinitis  . Asthma   . Chronic kidney disease    hx of cystitis   . COPD (chronic obstructive pulmonary disease) ([place])   . Depression   . Dizziness    vertigo   . Gallstones   . GERD (gastroesophageal reflux disease)   . Hyperlipidemia   . Hypertension   . Obesity   . Osteoarthritis   . Osteoporosis   . Peripheral vascular disease ([facility])    swelling   . Pneumonia   . Shortness of breath    with exertion   . Shoulder injury    left  . Sleep apnea    never diagnosed   . Urinary incontinence     Past Surgical History:  Procedure Laterality Date  . CHOLECYSTECTOMY  09/14/2011   Procedure: LAPAROSCOPIC CHOLECYSTECTOMY;  Surgeon: Odis Hollingshead, MD;  Location: [facility];  Service: General;  Laterality: N/A;  attempted intraoperative cholangiogram   . HIP ARTHROPLASTY Right 04/17/2016   Procedure: ARTHROPLASTY BIPOLAR HIP (HEMIARTHROPLASTY);  Surgeon: Paralee Cancel, MD;  Location: [facility];  Service: Orthopedics;  Laterality: Right;  . L TKR    . LIVER BIOPSY  09/14/2011   Procedure: LIVER BIOPSY;  Surgeon: Odis Hollingshead, MD;  Location: [facility];  Service: General;  Laterality: N/A;  . OTHER SURGICAL HISTORY     left leg surgery   . OTHER SURGICAL HISTORY     left breast surgery due to mastitis   . right left knee arthroplast      Family History  Problem Relation Age of Onset  . Hypertension Mother   . Arthritis Mother   . Diabetes Mother   . Stroke Mother   . Stroke Father   . Hypertension Father   . Hypertension Other   . CAD Neg Hx     Social History   Tobacco Use  . Smoking  status: Former Smoker    Packs/day: 0.50    Years: 35.00    Pack years: 17.50    Types: Cigarettes    Quit date: 05/24/2001    Years since quitting: 17.9  . Smokeless tobacco: Never Used  Substance Use Topics  . Alcohol use: Yes    Comment: socially     Subjective:    I connected with Mellina Kestner on 04/27/19 at  8:40 AM EST by telephone call  and verified that I am speaking with the correct person using two identifiers.   I discussed the limitations of evaluation and management by telemedicine and the availability of in person appointments. The patient expressed understanding and agreed to proceed.  Phone call is completed with daughter as patient does not  speak any English; notes that yesterday blood pressure was noted to be 170/ 65 and felt "some pounding" in her ear; gave mother extra Lasix and did notice improve in the blood pressure; Daughter is concerned because mother is bed ridden and ran a low grade fever yesterday; notes she does not have a fever today and patient denies any trouble breathing; patient's daughter is a PT and checked her mother's lungs- is worried about secondary pneumonia but notes that her mother's lungs are clear; per daughter, mother has asthma and COPD but does not take any type of albuterol; per daughter, mother is continuing to have swelling in her legs and questions continued use of Lasix;   Objective:  There were no vitals filed for this visit.  Lungs: Respirations unlabored;   Assessment:  1. Acute congestive heart failure, unspecified heart failure type ([place])   2. Urinary tract infection without hematuria, site unspecified   3. Asthma, moderate persistent, well-controlled     Plan:  Agree with daughter's concerns that the swelling in the legs could mean heart failure exacerbation; she is comfortable to continue the Lasix until the swelling improves and hopefully limit pneumonia; will go ahead and start antibiotic as well; strict ER  precautions discussed; daughter defers refill on albuterol;  Daughter will also plan to drop off urine sample for culture to ensure that mother's recent UTI ( from October) has cleared completely;  Time spent 15 minutes  No follow-ups on file.  Orders Placed This Encounter  Procedures  . Urine Culture    Standing Status:   Future    Standing Expiration Date:   04/26/2020    Requested Prescriptions   Signed Prescriptions Disp Refills  . doxycycline (VIBRA-TABS) 100 MG tablet 20 tablet 0    Sig: Take 1 tablet (100 mg total) by mouth 2 (two) times daily.

## 2019-05-11 ENCOUNTER — Other Ambulatory Visit: Payer: Self-pay | Admitting: Internal Medicine

## 2019-05-16 ENCOUNTER — Other Ambulatory Visit: Payer: Medicare Other

## 2019-05-16 DIAGNOSIS — N390 Urinary tract infection, site not specified: Secondary | ICD-10-CM

## 2019-05-17 LAB — URINE CULTURE
MICRO NUMBER:: 1226706
SPECIMEN QUALITY:: ADEQUATE

## 2019-05-29 ENCOUNTER — Other Ambulatory Visit: Payer: Self-pay

## 2019-05-29 ENCOUNTER — Ambulatory Visit (INDEPENDENT_AMBULATORY_CARE_PROVIDER_SITE_OTHER): Payer: Medicare Other | Admitting: Internal Medicine

## 2019-05-29 ENCOUNTER — Encounter: Payer: Self-pay | Admitting: Internal Medicine

## 2019-05-29 VITALS — BP 112/66 | HR 83 | Temp 98.5°F

## 2019-05-29 DIAGNOSIS — M79604 Pain in right leg: Secondary | ICD-10-CM

## 2019-05-29 DIAGNOSIS — G309 Alzheimer's disease, unspecified: Secondary | ICD-10-CM | POA: Diagnosis not present

## 2019-05-29 DIAGNOSIS — F0280 Dementia in other diseases classified elsewhere without behavioral disturbance: Secondary | ICD-10-CM

## 2019-05-29 DIAGNOSIS — R739 Hyperglycemia, unspecified: Secondary | ICD-10-CM | POA: Diagnosis not present

## 2019-05-29 DIAGNOSIS — N390 Urinary tract infection, site not specified: Secondary | ICD-10-CM | POA: Diagnosis not present

## 2019-05-29 DIAGNOSIS — M545 Low back pain, unspecified: Secondary | ICD-10-CM

## 2019-05-29 DIAGNOSIS — F411 Generalized anxiety disorder: Secondary | ICD-10-CM | POA: Diagnosis not present

## 2019-05-29 DIAGNOSIS — R296 Repeated falls: Secondary | ICD-10-CM | POA: Diagnosis not present

## 2019-05-29 LAB — HEPATIC FUNCTION PANEL
ALT: 10 U/L (ref 0–35)
AST: 12 U/L (ref 0–37)
Albumin: 3.7 g/dL (ref 3.5–5.2)
Alkaline Phosphatase: 76 U/L (ref 39–117)
Bilirubin, Direct: 0.1 mg/dL (ref 0.0–0.3)
Total Bilirubin: 0.5 mg/dL (ref 0.2–1.2)
Total Protein: 6.5 g/dL (ref 6.0–8.3)

## 2019-05-29 LAB — BASIC METABOLIC PANEL
BUN: 14 mg/dL (ref 6–23)
CO2: 30 mEq/L (ref 19–32)
Calcium: 9 mg/dL (ref 8.4–10.5)
Chloride: 102 mEq/L (ref 96–112)
Creatinine, Ser: 1 mg/dL (ref 0.40–1.20)
GFR: 52.87 mL/min — ABNORMAL LOW (ref >60.00)
Glucose, Bld: 173 mg/dL — ABNORMAL HIGH (ref 70–99)
Potassium: 4 mEq/L (ref 3.5–5.1)
Sodium: 139 mEq/L (ref 135–145)

## 2019-05-29 LAB — HEMOGLOBIN A1C: Hgb A1c MFr Bld: 5.4 % (ref 4.6–6.5)

## 2019-05-29 NOTE — Assessment & Plan Note (Signed)
Worse NHP advised - FL2 for [facility]  PT at home

## 2019-05-29 NOTE — Assessment & Plan Note (Addendum)
Worse meds reviewed NHP advised - FL2 for [facility]

## 2019-05-29 NOTE — Assessment & Plan Note (Signed)
P [place] 10/20

## 2019-05-29 NOTE — Assessment & Plan Note (Signed)
Dementia Dr Adele Schilder Chronic; psychosomatic issues On Fluoxetine and Abilify  Potential benefits of a long term benzodiazepines  use as well as potential risks  and complications were explained to the patient and were aknowledged.

## 2019-05-29 NOTE — Assessment & Plan Note (Addendum)
Home PT NHP advised - FL2 for [facility]

## 2019-05-29 NOTE — Progress Notes (Signed)
Subjective:  Patient ID: Sabrina Page, female    DOB: 27-Sep-1933  Age: 86 y.o. MRN: [id number]  CC: No chief complaint on file.   HPI Sabrina Page presents for dementia, falls x3, CHF f/u  Outpatient Medications Prior to Visit  Medication Sig Dispense Refill  . ARIPiprazole (ABILIFY) 2 MG tablet TAKE 1 TABLET BY MOUTH EVERY DAY 90 tablet 1  . aspirin 81 MG EC tablet Take 1 tablet (81 mg total) by mouth 2 (two) times daily. 60 tablet 0  . B Complex-C (SUPER B COMPLEX PO) Take 1 tablet by mouth every morning.     . Cholecalciferol 1000 UNITS capsule Take 1,000 Units by mouth every morning.     . clotrimazole-betamethasone (LOTRISONE) cream Apply 1 application topically 2 (two) times daily. 45 g 1  . doxycycline (VIBRA-TABS) 100 MG tablet Take 1 tablet (100 mg total) by mouth 2 (two) times daily. 20 tablet 0  . fludrocortisone (FLORINEF) 0.1 MG tablet TAKE 1 TABLET BY MOUTH EVERY DAY 90 tablet 3  . FLUoxetine (PROZAC) 40 MG capsule TAKE 1 CAPSULE BY MOUTH EVERY DAY 90 capsule 3  . furosemide (LASIX) 40 MG tablet Take 1 tablet (40 mg total) by mouth daily as needed for edema. 30 tablet 5  . ipratropium-albuterol (DUONEB) 0.5-2.5 (3) MG/3ML SOLN 1 neb every 6 hours if needed 75 mL 12  . loratadine (CLARITIN) 10 MG tablet TAKE 1 TABLET BY MOUTH EVERY DAY IN THE MORNING 90 tablet 3  . memantine (NAMENDA) 10 MG tablet TAKE 1 TABLET BY MOUTH TWICE A DAY 180 tablet 3  . midodrine (PROAMATINE) 10 MG tablet Take 0.5 tablets (5 mg total) by mouth 2 (two) times daily. 90 tablet 3  . Omega-3 Fatty Acids (FISH OIL) 1000 MG CAPS Take 1 capsule by mouth every morning.     Marland Kitchen omeprazole (PRILOSEC) 20 MG capsule TAKE 2 CAPSULES BY MOUTH EVERY DAY 180 capsule 3  . potassium chloride (KLOR-CON) 8 MEQ tablet TAKE 1 TABLET (8 MEQ TOTAL) BY MOUTH EVERY MORNING. 90 tablet 3  . pyridostigmine (MESTINON) 60 MG tablet Take 1 tablet (60 mg total) by mouth 3 (three) times daily. 270 tablet 3  .  traMADol (ULTRAM) 50 MG tablet Take 1 tablet (50 mg total) by mouth every 12 (twelve) hours as needed for severe pain. 100 tablet 2   No facility-administered medications prior to visit.    ROS: Review of Systems  Constitutional: Positive for fatigue. Negative for activity change, appetite change, chills and unexpected weight change.  HENT: Negative for congestion, mouth sores and sinus pressure.   Eyes: Negative for visual disturbance.  Respiratory: Negative for cough and chest tightness.   Gastrointestinal: Negative for abdominal pain and nausea.  Genitourinary: Negative for difficulty urinating, frequency and vaginal pain.  Musculoskeletal: Positive for arthralgias, back pain and gait problem.  Skin: Negative for pallor and rash.  Neurological: Positive for dizziness, weakness and light-headedness. Negative for tremors, numbness and headaches.  Psychiatric/Behavioral: Positive for behavioral problems, confusion and decreased concentration. Negative for hallucinations, self-injury, sleep disturbance and suicidal ideas. The patient is nervous/anxious.     Objective:  BP 112/66 (BP Location: Left Arm, Patient Position: Sitting, Cuff Size: Normal)   Pulse 83   Temp 98.5 F (36.9 C) (Oral)   SpO2 97%   BP Readings from Last 3 Encounters:  05/29/19 112/66  03/16/19 110/65  02/27/19 126/78    Wt Readings from Last 3 Encounters:  03/16/19 238 lb (108 kg)  02/27/19  238 lb (108 kg)  01/18/19 242 lb (109.8 kg)    Physical Exam Constitutional:      General: She is not in acute distress.    Appearance: She is well-developed. She is obese.  HENT:     Head: Normocephalic.     Right Ear: External ear normal.     Left Ear: External ear normal.     Nose: Nose normal.  Eyes:     General:        Right eye: No discharge.        Left eye: No discharge.     Conjunctiva/sclera: Conjunctivae normal.     Pupils: Pupils are equal, round, and reactive to light.  Neck:     Thyroid: No  thyromegaly.     Vascular: No JVD.     Trachea: No tracheal deviation.  Cardiovascular:     Rate and Rhythm: Normal rate and regular rhythm.     Heart sounds: Normal heart sounds.  Pulmonary:     Effort: No respiratory distress.     Breath sounds: No stridor. No wheezing.  Abdominal:     General: Bowel sounds are normal. There is no distension.     Palpations: Abdomen is soft. There is no mass.     Tenderness: There is no abdominal tenderness. There is no guarding or rebound.  Musculoskeletal:        General: Tenderness present.     Cervical back: Normal range of motion and neck supple.  Lymphadenopathy:     Cervical: No cervical adenopathy.  Skin:    Findings: No erythema or rash.  Neurological:     Mental Status: She is disoriented.     Cranial Nerves: No cranial nerve deficit.     Motor: Weakness present. No abnormal muscle tone.     Coordination: Coordination abnormal.     Gait: Gait abnormal.     Deep Tendon Reflexes: Reflexes normal.  Psychiatric:        Behavior: Behavior normal.    In a w/c  FTF>40 min including FL2 form, referrals placement  Lab Results  Component Value Date   WBC 7.1 01/18/2019   HGB 14.1 01/18/2019   HCT 42.7 01/18/2019   PLT 243.0 01/18/2019   GLUCOSE 109 (H) 01/18/2019   CHOL 270 (H) 06/12/2014   TRIG 132.0 06/12/2014   HDL 52.40 06/12/2014   LDLDIRECT 159.1 05/16/2012   LDLCALC 191 (H) 06/12/2014   ALT 13 07/24/2018   AST 13 07/24/2018   NA 139 01/18/2019   K 4.5 01/18/2019   CL 102 01/18/2019   CREATININE 0.92 01/18/2019   BUN 13 01/18/2019   CO2 30 01/18/2019   TSH 2.09 01/18/2019   INR 1.07 04/16/2016   HGBA1C 5.7 11/01/2018    DG Abd 1 View  Result Date: 01/18/2019 CLINICAL DATA:  Right flank pain.  Constipation. EXAM: ABDOMEN - 1 VIEW COMPARISON:  None. FINDINGS: The bowel gas pattern is normal. Status post cholecystectomy. Moderate amount of stool seen throughout the colon. No radio-opaque calculi or other significant  radiographic abnormality are seen. IMPRESSION: No evidence of bowel obstruction or ileus. Moderate stool burden. No definite nephrolithiasis is noted. Electronically Signed   By: Marijo Conception M.D.   On: 01/18/2019 14:58    Assessment & Plan:   There are no diagnoses linked to this encounter.   No orders of the defined types were placed in this encounter.    Follow-up: No follow-ups on file.  [name], MD

## 2019-06-01 DIAGNOSIS — Z9181 History of falling: Secondary | ICD-10-CM | POA: Diagnosis not present

## 2019-06-01 DIAGNOSIS — R32 Unspecified urinary incontinence: Secondary | ICD-10-CM | POA: Diagnosis not present

## 2019-06-01 DIAGNOSIS — M4800 Spinal stenosis, site unspecified: Secondary | ICD-10-CM | POA: Diagnosis not present

## 2019-06-01 DIAGNOSIS — F411 Generalized anxiety disorder: Secondary | ICD-10-CM | POA: Diagnosis not present

## 2019-06-01 DIAGNOSIS — I951 Orthostatic hypotension: Secondary | ICD-10-CM | POA: Diagnosis not present

## 2019-06-01 DIAGNOSIS — Z87440 Personal history of urinary (tract) infections: Secondary | ICD-10-CM | POA: Diagnosis not present

## 2019-06-01 DIAGNOSIS — I110 Hypertensive heart disease with heart failure: Secondary | ICD-10-CM | POA: Diagnosis not present

## 2019-06-01 DIAGNOSIS — Z993 Dependence on wheelchair: Secondary | ICD-10-CM | POA: Diagnosis not present

## 2019-06-01 DIAGNOSIS — G309 Alzheimer's disease, unspecified: Secondary | ICD-10-CM | POA: Diagnosis not present

## 2019-06-01 DIAGNOSIS — Z7982 Long term (current) use of aspirin: Secondary | ICD-10-CM | POA: Diagnosis not present

## 2019-06-01 DIAGNOSIS — F0280 Dementia in other diseases classified elsewhere without behavioral disturbance: Secondary | ICD-10-CM | POA: Diagnosis not present

## 2019-06-01 DIAGNOSIS — I509 Heart failure, unspecified: Secondary | ICD-10-CM | POA: Diagnosis not present

## 2019-06-01 DIAGNOSIS — E119 Type 2 diabetes mellitus without complications: Secondary | ICD-10-CM | POA: Diagnosis not present

## 2019-06-04 DIAGNOSIS — G309 Alzheimer's disease, unspecified: Secondary | ICD-10-CM | POA: Diagnosis not present

## 2019-06-04 DIAGNOSIS — E119 Type 2 diabetes mellitus without complications: Secondary | ICD-10-CM | POA: Diagnosis not present

## 2019-06-04 DIAGNOSIS — I110 Hypertensive heart disease with heart failure: Secondary | ICD-10-CM | POA: Diagnosis not present

## 2019-06-04 DIAGNOSIS — F0280 Dementia in other diseases classified elsewhere without behavioral disturbance: Secondary | ICD-10-CM | POA: Diagnosis not present

## 2019-06-04 DIAGNOSIS — I509 Heart failure, unspecified: Secondary | ICD-10-CM | POA: Diagnosis not present

## 2019-06-04 DIAGNOSIS — M4800 Spinal stenosis, site unspecified: Secondary | ICD-10-CM | POA: Diagnosis not present

## 2019-06-05 DIAGNOSIS — I509 Heart failure, unspecified: Secondary | ICD-10-CM | POA: Diagnosis not present

## 2019-06-05 DIAGNOSIS — F0280 Dementia in other diseases classified elsewhere without behavioral disturbance: Secondary | ICD-10-CM | POA: Diagnosis not present

## 2019-06-05 DIAGNOSIS — E119 Type 2 diabetes mellitus without complications: Secondary | ICD-10-CM | POA: Diagnosis not present

## 2019-06-05 DIAGNOSIS — G309 Alzheimer's disease, unspecified: Secondary | ICD-10-CM | POA: Diagnosis not present

## 2019-06-05 DIAGNOSIS — M4800 Spinal stenosis, site unspecified: Secondary | ICD-10-CM | POA: Diagnosis not present

## 2019-06-05 DIAGNOSIS — I110 Hypertensive heart disease with heart failure: Secondary | ICD-10-CM | POA: Diagnosis not present

## 2019-06-07 DIAGNOSIS — E119 Type 2 diabetes mellitus without complications: Secondary | ICD-10-CM | POA: Diagnosis not present

## 2019-06-07 DIAGNOSIS — I509 Heart failure, unspecified: Secondary | ICD-10-CM | POA: Diagnosis not present

## 2019-06-07 DIAGNOSIS — M4800 Spinal stenosis, site unspecified: Secondary | ICD-10-CM | POA: Diagnosis not present

## 2019-06-07 DIAGNOSIS — F0280 Dementia in other diseases classified elsewhere without behavioral disturbance: Secondary | ICD-10-CM | POA: Diagnosis not present

## 2019-06-07 DIAGNOSIS — G309 Alzheimer's disease, unspecified: Secondary | ICD-10-CM | POA: Diagnosis not present

## 2019-06-07 DIAGNOSIS — I110 Hypertensive heart disease with heart failure: Secondary | ICD-10-CM | POA: Diagnosis not present

## 2019-06-08 DIAGNOSIS — I509 Heart failure, unspecified: Secondary | ICD-10-CM | POA: Diagnosis not present

## 2019-06-08 DIAGNOSIS — E119 Type 2 diabetes mellitus without complications: Secondary | ICD-10-CM | POA: Diagnosis not present

## 2019-06-08 DIAGNOSIS — I110 Hypertensive heart disease with heart failure: Secondary | ICD-10-CM | POA: Diagnosis not present

## 2019-06-08 DIAGNOSIS — M4800 Spinal stenosis, site unspecified: Secondary | ICD-10-CM | POA: Diagnosis not present

## 2019-06-08 DIAGNOSIS — G309 Alzheimer's disease, unspecified: Secondary | ICD-10-CM | POA: Diagnosis not present

## 2019-06-08 DIAGNOSIS — F0280 Dementia in other diseases classified elsewhere without behavioral disturbance: Secondary | ICD-10-CM | POA: Diagnosis not present

## 2019-06-11 DIAGNOSIS — I509 Heart failure, unspecified: Secondary | ICD-10-CM | POA: Diagnosis not present

## 2019-06-11 DIAGNOSIS — M4800 Spinal stenosis, site unspecified: Secondary | ICD-10-CM | POA: Diagnosis not present

## 2019-06-11 DIAGNOSIS — G309 Alzheimer's disease, unspecified: Secondary | ICD-10-CM | POA: Diagnosis not present

## 2019-06-11 DIAGNOSIS — I110 Hypertensive heart disease with heart failure: Secondary | ICD-10-CM | POA: Diagnosis not present

## 2019-06-11 DIAGNOSIS — F0280 Dementia in other diseases classified elsewhere without behavioral disturbance: Secondary | ICD-10-CM | POA: Diagnosis not present

## 2019-06-11 DIAGNOSIS — E119 Type 2 diabetes mellitus without complications: Secondary | ICD-10-CM | POA: Diagnosis not present

## 2019-06-13 DIAGNOSIS — I110 Hypertensive heart disease with heart failure: Secondary | ICD-10-CM | POA: Diagnosis not present

## 2019-06-13 DIAGNOSIS — M4800 Spinal stenosis, site unspecified: Secondary | ICD-10-CM | POA: Diagnosis not present

## 2019-06-13 DIAGNOSIS — F0280 Dementia in other diseases classified elsewhere without behavioral disturbance: Secondary | ICD-10-CM | POA: Diagnosis not present

## 2019-06-13 DIAGNOSIS — E119 Type 2 diabetes mellitus without complications: Secondary | ICD-10-CM | POA: Diagnosis not present

## 2019-06-13 DIAGNOSIS — G309 Alzheimer's disease, unspecified: Secondary | ICD-10-CM | POA: Diagnosis not present

## 2019-06-13 DIAGNOSIS — I509 Heart failure, unspecified: Secondary | ICD-10-CM | POA: Diagnosis not present

## 2019-06-15 ENCOUNTER — Telehealth: Payer: Self-pay

## 2019-06-15 ENCOUNTER — Other Ambulatory Visit: Payer: Self-pay | Admitting: Internal Medicine

## 2019-06-15 DIAGNOSIS — F0280 Dementia in other diseases classified elsewhere without behavioral disturbance: Secondary | ICD-10-CM | POA: Diagnosis not present

## 2019-06-15 DIAGNOSIS — I110 Hypertensive heart disease with heart failure: Secondary | ICD-10-CM | POA: Diagnosis not present

## 2019-06-15 DIAGNOSIS — I509 Heart failure, unspecified: Secondary | ICD-10-CM

## 2019-06-15 DIAGNOSIS — G309 Alzheimer's disease, unspecified: Secondary | ICD-10-CM | POA: Diagnosis not present

## 2019-06-15 DIAGNOSIS — E119 Type 2 diabetes mellitus without complications: Secondary | ICD-10-CM | POA: Diagnosis not present

## 2019-06-15 DIAGNOSIS — M4800 Spinal stenosis, site unspecified: Secondary | ICD-10-CM | POA: Diagnosis not present

## 2019-06-15 NOTE — Telephone Encounter (Signed)
Caller request order for compression stockings. please fax order to [phone].  Ph [phone] [facility].

## 2019-06-16 DIAGNOSIS — M4800 Spinal stenosis, site unspecified: Secondary | ICD-10-CM | POA: Diagnosis not present

## 2019-06-16 DIAGNOSIS — E119 Type 2 diabetes mellitus without complications: Secondary | ICD-10-CM | POA: Diagnosis not present

## 2019-06-16 DIAGNOSIS — F0280 Dementia in other diseases classified elsewhere without behavioral disturbance: Secondary | ICD-10-CM | POA: Diagnosis not present

## 2019-06-16 DIAGNOSIS — I509 Heart failure, unspecified: Secondary | ICD-10-CM | POA: Diagnosis not present

## 2019-06-16 DIAGNOSIS — I110 Hypertensive heart disease with heart failure: Secondary | ICD-10-CM | POA: Diagnosis not present

## 2019-06-16 DIAGNOSIS — G309 Alzheimer's disease, unspecified: Secondary | ICD-10-CM | POA: Diagnosis not present

## 2019-06-18 DIAGNOSIS — F0280 Dementia in other diseases classified elsewhere without behavioral disturbance: Secondary | ICD-10-CM | POA: Diagnosis not present

## 2019-06-18 DIAGNOSIS — I110 Hypertensive heart disease with heart failure: Secondary | ICD-10-CM | POA: Diagnosis not present

## 2019-06-18 DIAGNOSIS — M4800 Spinal stenosis, site unspecified: Secondary | ICD-10-CM | POA: Diagnosis not present

## 2019-06-18 DIAGNOSIS — E119 Type 2 diabetes mellitus without complications: Secondary | ICD-10-CM | POA: Diagnosis not present

## 2019-06-18 DIAGNOSIS — G309 Alzheimer's disease, unspecified: Secondary | ICD-10-CM | POA: Diagnosis not present

## 2019-06-18 DIAGNOSIS — I509 Heart failure, unspecified: Secondary | ICD-10-CM | POA: Diagnosis not present

## 2019-06-18 NOTE — Telephone Encounter (Signed)
Please advise 

## 2019-06-18 NOTE — Telephone Encounter (Signed)
Okay.  8-12 mmHg.  Thanks

## 2019-06-20 NOTE — Telephone Encounter (Signed)
faxed

## 2019-06-20 NOTE — Addendum Note (Signed)
Addended by: Karren Cobble on: 06/20/2019 02:35 PM   Modules accepted: Orders

## 2019-06-22 DIAGNOSIS — E119 Type 2 diabetes mellitus without complications: Secondary | ICD-10-CM | POA: Diagnosis not present

## 2019-06-22 DIAGNOSIS — M4800 Spinal stenosis, site unspecified: Secondary | ICD-10-CM | POA: Diagnosis not present

## 2019-06-22 DIAGNOSIS — I110 Hypertensive heart disease with heart failure: Secondary | ICD-10-CM | POA: Diagnosis not present

## 2019-06-22 DIAGNOSIS — F0280 Dementia in other diseases classified elsewhere without behavioral disturbance: Secondary | ICD-10-CM | POA: Diagnosis not present

## 2019-06-22 DIAGNOSIS — I509 Heart failure, unspecified: Secondary | ICD-10-CM | POA: Diagnosis not present

## 2019-06-22 DIAGNOSIS — G309 Alzheimer's disease, unspecified: Secondary | ICD-10-CM | POA: Diagnosis not present

## 2019-06-23 DIAGNOSIS — I110 Hypertensive heart disease with heart failure: Secondary | ICD-10-CM | POA: Diagnosis not present

## 2019-06-23 DIAGNOSIS — M4800 Spinal stenosis, site unspecified: Secondary | ICD-10-CM | POA: Diagnosis not present

## 2019-06-23 DIAGNOSIS — G309 Alzheimer's disease, unspecified: Secondary | ICD-10-CM | POA: Diagnosis not present

## 2019-06-23 DIAGNOSIS — F0280 Dementia in other diseases classified elsewhere without behavioral disturbance: Secondary | ICD-10-CM | POA: Diagnosis not present

## 2019-06-23 DIAGNOSIS — E119 Type 2 diabetes mellitus without complications: Secondary | ICD-10-CM | POA: Diagnosis not present

## 2019-06-23 DIAGNOSIS — I509 Heart failure, unspecified: Secondary | ICD-10-CM | POA: Diagnosis not present

## 2019-06-25 ENCOUNTER — Telehealth: Payer: Self-pay | Admitting: Internal Medicine

## 2019-06-25 DIAGNOSIS — R627 Adult failure to thrive: Secondary | ICD-10-CM

## 2019-06-25 DIAGNOSIS — E119 Type 2 diabetes mellitus without complications: Secondary | ICD-10-CM | POA: Diagnosis not present

## 2019-06-25 DIAGNOSIS — M79604 Pain in right leg: Secondary | ICD-10-CM

## 2019-06-25 DIAGNOSIS — I110 Hypertensive heart disease with heart failure: Secondary | ICD-10-CM | POA: Diagnosis not present

## 2019-06-25 DIAGNOSIS — G309 Alzheimer's disease, unspecified: Secondary | ICD-10-CM

## 2019-06-25 DIAGNOSIS — F0280 Dementia in other diseases classified elsewhere without behavioral disturbance: Secondary | ICD-10-CM | POA: Diagnosis not present

## 2019-06-25 DIAGNOSIS — M4800 Spinal stenosis, site unspecified: Secondary | ICD-10-CM | POA: Diagnosis not present

## 2019-06-25 DIAGNOSIS — M545 Low back pain: Secondary | ICD-10-CM

## 2019-06-25 DIAGNOSIS — I509 Heart failure, unspecified: Secondary | ICD-10-CM | POA: Diagnosis not present

## 2019-06-25 NOTE — Telephone Encounter (Signed)
I do not see anything in patient chart about this. Please advise?

## 2019-06-25 NOTE — Telephone Encounter (Signed)
States that a order was placed for a hospital bed with [facility].  They have not received notification on bed.  Please follow up with daughter in regard.

## 2019-06-25 NOTE — Telephone Encounter (Signed)
Please place an order for a hospital bed.  Thanks

## 2019-06-25 NOTE — Telephone Encounter (Signed)
Daughter dropped off [employer] form to be completed for patient.  These forms will allow her to get coverage for home health services.  I have placed in Plots box.

## 2019-06-27 NOTE — Telephone Encounter (Signed)
Thx

## 2019-06-29 DIAGNOSIS — I110 Hypertensive heart disease with heart failure: Secondary | ICD-10-CM | POA: Diagnosis not present

## 2019-06-29 DIAGNOSIS — E119 Type 2 diabetes mellitus without complications: Secondary | ICD-10-CM | POA: Diagnosis not present

## 2019-06-29 DIAGNOSIS — I509 Heart failure, unspecified: Secondary | ICD-10-CM | POA: Diagnosis not present

## 2019-06-29 DIAGNOSIS — G309 Alzheimer's disease, unspecified: Secondary | ICD-10-CM | POA: Diagnosis not present

## 2019-06-29 DIAGNOSIS — F0280 Dementia in other diseases classified elsewhere without behavioral disturbance: Secondary | ICD-10-CM | POA: Diagnosis not present

## 2019-06-29 DIAGNOSIS — M4800 Spinal stenosis, site unspecified: Secondary | ICD-10-CM | POA: Diagnosis not present

## 2019-06-29 NOTE — Telephone Encounter (Signed)
Patient's daughter states that she is going to stop by on Monday to drop off the address that these forms need to be mailed to.

## 2019-06-29 NOTE — Telephone Encounter (Signed)
Daughter notified that form does have address and it will be mailed

## 2019-06-30 DIAGNOSIS — G309 Alzheimer's disease, unspecified: Secondary | ICD-10-CM | POA: Diagnosis not present

## 2019-06-30 DIAGNOSIS — M4800 Spinal stenosis, site unspecified: Secondary | ICD-10-CM | POA: Diagnosis not present

## 2019-06-30 DIAGNOSIS — I110 Hypertensive heart disease with heart failure: Secondary | ICD-10-CM | POA: Diagnosis not present

## 2019-06-30 DIAGNOSIS — I509 Heart failure, unspecified: Secondary | ICD-10-CM | POA: Diagnosis not present

## 2019-06-30 DIAGNOSIS — F0280 Dementia in other diseases classified elsewhere without behavioral disturbance: Secondary | ICD-10-CM | POA: Diagnosis not present

## 2019-06-30 DIAGNOSIS — E119 Type 2 diabetes mellitus without complications: Secondary | ICD-10-CM | POA: Diagnosis not present

## 2019-07-01 DIAGNOSIS — Z87440 Personal history of urinary (tract) infections: Secondary | ICD-10-CM | POA: Diagnosis not present

## 2019-07-01 DIAGNOSIS — E119 Type 2 diabetes mellitus without complications: Secondary | ICD-10-CM | POA: Diagnosis not present

## 2019-07-01 DIAGNOSIS — I110 Hypertensive heart disease with heart failure: Secondary | ICD-10-CM | POA: Diagnosis not present

## 2019-07-01 DIAGNOSIS — G309 Alzheimer's disease, unspecified: Secondary | ICD-10-CM | POA: Diagnosis not present

## 2019-07-01 DIAGNOSIS — I509 Heart failure, unspecified: Secondary | ICD-10-CM | POA: Diagnosis not present

## 2019-07-01 DIAGNOSIS — I951 Orthostatic hypotension: Secondary | ICD-10-CM | POA: Diagnosis not present

## 2019-07-01 DIAGNOSIS — F411 Generalized anxiety disorder: Secondary | ICD-10-CM | POA: Diagnosis not present

## 2019-07-01 DIAGNOSIS — M4800 Spinal stenosis, site unspecified: Secondary | ICD-10-CM | POA: Diagnosis not present

## 2019-07-01 DIAGNOSIS — R32 Unspecified urinary incontinence: Secondary | ICD-10-CM | POA: Diagnosis not present

## 2019-07-01 DIAGNOSIS — Z993 Dependence on wheelchair: Secondary | ICD-10-CM | POA: Diagnosis not present

## 2019-07-01 DIAGNOSIS — Z9181 History of falling: Secondary | ICD-10-CM | POA: Diagnosis not present

## 2019-07-01 DIAGNOSIS — F0280 Dementia in other diseases classified elsewhere without behavioral disturbance: Secondary | ICD-10-CM | POA: Diagnosis not present

## 2019-07-01 DIAGNOSIS — Z7982 Long term (current) use of aspirin: Secondary | ICD-10-CM | POA: Diagnosis not present

## 2019-07-02 DIAGNOSIS — I110 Hypertensive heart disease with heart failure: Secondary | ICD-10-CM | POA: Diagnosis not present

## 2019-07-02 DIAGNOSIS — I509 Heart failure, unspecified: Secondary | ICD-10-CM | POA: Diagnosis not present

## 2019-07-02 DIAGNOSIS — F0280 Dementia in other diseases classified elsewhere without behavioral disturbance: Secondary | ICD-10-CM | POA: Diagnosis not present

## 2019-07-02 DIAGNOSIS — G309 Alzheimer's disease, unspecified: Secondary | ICD-10-CM | POA: Diagnosis not present

## 2019-07-02 DIAGNOSIS — M4800 Spinal stenosis, site unspecified: Secondary | ICD-10-CM | POA: Diagnosis not present

## 2019-07-02 DIAGNOSIS — E119 Type 2 diabetes mellitus without complications: Secondary | ICD-10-CM | POA: Diagnosis not present

## 2019-07-03 DIAGNOSIS — Z9181 History of falling: Secondary | ICD-10-CM

## 2019-07-03 DIAGNOSIS — I951 Orthostatic hypotension: Secondary | ICD-10-CM

## 2019-07-03 DIAGNOSIS — G309 Alzheimer's disease, unspecified: Secondary | ICD-10-CM | POA: Diagnosis not present

## 2019-07-03 DIAGNOSIS — I110 Hypertensive heart disease with heart failure: Secondary | ICD-10-CM

## 2019-07-03 DIAGNOSIS — Z7982 Long term (current) use of aspirin: Secondary | ICD-10-CM

## 2019-07-03 DIAGNOSIS — I509 Heart failure, unspecified: Secondary | ICD-10-CM

## 2019-07-03 DIAGNOSIS — M4800 Spinal stenosis, site unspecified: Secondary | ICD-10-CM

## 2019-07-03 DIAGNOSIS — Z87440 Personal history of urinary (tract) infections: Secondary | ICD-10-CM | POA: Diagnosis not present

## 2019-07-03 DIAGNOSIS — Z993 Dependence on wheelchair: Secondary | ICD-10-CM

## 2019-07-03 DIAGNOSIS — Z6830 Body mass index (BMI) 30.0-30.9, adult: Secondary | ICD-10-CM

## 2019-07-03 DIAGNOSIS — F411 Generalized anxiety disorder: Secondary | ICD-10-CM | POA: Diagnosis not present

## 2019-07-03 DIAGNOSIS — E119 Type 2 diabetes mellitus without complications: Secondary | ICD-10-CM | POA: Diagnosis not present

## 2019-07-03 DIAGNOSIS — R32 Unspecified urinary incontinence: Secondary | ICD-10-CM | POA: Diagnosis not present

## 2019-07-03 DIAGNOSIS — F0280 Dementia in other diseases classified elsewhere without behavioral disturbance: Secondary | ICD-10-CM

## 2019-07-05 DIAGNOSIS — I509 Heart failure, unspecified: Secondary | ICD-10-CM | POA: Diagnosis not present

## 2019-07-05 DIAGNOSIS — M4800 Spinal stenosis, site unspecified: Secondary | ICD-10-CM | POA: Diagnosis not present

## 2019-07-05 DIAGNOSIS — E119 Type 2 diabetes mellitus without complications: Secondary | ICD-10-CM | POA: Diagnosis not present

## 2019-07-05 DIAGNOSIS — F0280 Dementia in other diseases classified elsewhere without behavioral disturbance: Secondary | ICD-10-CM | POA: Diagnosis not present

## 2019-07-05 DIAGNOSIS — G309 Alzheimer's disease, unspecified: Secondary | ICD-10-CM | POA: Diagnosis not present

## 2019-07-05 DIAGNOSIS — I110 Hypertensive heart disease with heart failure: Secondary | ICD-10-CM | POA: Diagnosis not present

## 2019-07-07 DIAGNOSIS — E119 Type 2 diabetes mellitus without complications: Secondary | ICD-10-CM | POA: Diagnosis not present

## 2019-07-07 DIAGNOSIS — M4800 Spinal stenosis, site unspecified: Secondary | ICD-10-CM | POA: Diagnosis not present

## 2019-07-07 DIAGNOSIS — G309 Alzheimer's disease, unspecified: Secondary | ICD-10-CM | POA: Diagnosis not present

## 2019-07-07 DIAGNOSIS — I110 Hypertensive heart disease with heart failure: Secondary | ICD-10-CM | POA: Diagnosis not present

## 2019-07-07 DIAGNOSIS — I509 Heart failure, unspecified: Secondary | ICD-10-CM | POA: Diagnosis not present

## 2019-07-07 DIAGNOSIS — F0280 Dementia in other diseases classified elsewhere without behavioral disturbance: Secondary | ICD-10-CM | POA: Diagnosis not present

## 2019-07-10 DIAGNOSIS — I509 Heart failure, unspecified: Secondary | ICD-10-CM | POA: Diagnosis not present

## 2019-07-10 DIAGNOSIS — E119 Type 2 diabetes mellitus without complications: Secondary | ICD-10-CM | POA: Diagnosis not present

## 2019-07-10 DIAGNOSIS — I110 Hypertensive heart disease with heart failure: Secondary | ICD-10-CM | POA: Diagnosis not present

## 2019-07-10 DIAGNOSIS — F0280 Dementia in other diseases classified elsewhere without behavioral disturbance: Secondary | ICD-10-CM | POA: Diagnosis not present

## 2019-07-10 DIAGNOSIS — G309 Alzheimer's disease, unspecified: Secondary | ICD-10-CM | POA: Diagnosis not present

## 2019-07-10 DIAGNOSIS — M4800 Spinal stenosis, site unspecified: Secondary | ICD-10-CM | POA: Diagnosis not present

## 2019-07-13 DIAGNOSIS — M4800 Spinal stenosis, site unspecified: Secondary | ICD-10-CM | POA: Diagnosis not present

## 2019-07-13 DIAGNOSIS — I509 Heart failure, unspecified: Secondary | ICD-10-CM | POA: Diagnosis not present

## 2019-07-13 DIAGNOSIS — F0280 Dementia in other diseases classified elsewhere without behavioral disturbance: Secondary | ICD-10-CM | POA: Diagnosis not present

## 2019-07-13 DIAGNOSIS — I110 Hypertensive heart disease with heart failure: Secondary | ICD-10-CM | POA: Diagnosis not present

## 2019-07-13 DIAGNOSIS — G309 Alzheimer's disease, unspecified: Secondary | ICD-10-CM | POA: Diagnosis not present

## 2019-07-13 DIAGNOSIS — E119 Type 2 diabetes mellitus without complications: Secondary | ICD-10-CM | POA: Diagnosis not present

## 2019-07-16 DIAGNOSIS — M4800 Spinal stenosis, site unspecified: Secondary | ICD-10-CM | POA: Diagnosis not present

## 2019-07-16 DIAGNOSIS — G309 Alzheimer's disease, unspecified: Secondary | ICD-10-CM | POA: Diagnosis not present

## 2019-07-16 DIAGNOSIS — E119 Type 2 diabetes mellitus without complications: Secondary | ICD-10-CM | POA: Diagnosis not present

## 2019-07-16 DIAGNOSIS — I110 Hypertensive heart disease with heart failure: Secondary | ICD-10-CM | POA: Diagnosis not present

## 2019-07-16 DIAGNOSIS — I509 Heart failure, unspecified: Secondary | ICD-10-CM | POA: Diagnosis not present

## 2019-07-16 DIAGNOSIS — F0280 Dementia in other diseases classified elsewhere without behavioral disturbance: Secondary | ICD-10-CM | POA: Diagnosis not present

## 2019-07-21 DIAGNOSIS — G309 Alzheimer's disease, unspecified: Secondary | ICD-10-CM | POA: Diagnosis not present

## 2019-07-21 DIAGNOSIS — I110 Hypertensive heart disease with heart failure: Secondary | ICD-10-CM | POA: Diagnosis not present

## 2019-07-21 DIAGNOSIS — M4800 Spinal stenosis, site unspecified: Secondary | ICD-10-CM | POA: Diagnosis not present

## 2019-07-21 DIAGNOSIS — F0280 Dementia in other diseases classified elsewhere without behavioral disturbance: Secondary | ICD-10-CM | POA: Diagnosis not present

## 2019-07-21 DIAGNOSIS — I509 Heart failure, unspecified: Secondary | ICD-10-CM | POA: Diagnosis not present

## 2019-07-21 DIAGNOSIS — E119 Type 2 diabetes mellitus without complications: Secondary | ICD-10-CM | POA: Diagnosis not present

## 2019-07-27 DIAGNOSIS — E119 Type 2 diabetes mellitus without complications: Secondary | ICD-10-CM | POA: Diagnosis not present

## 2019-07-27 DIAGNOSIS — F0280 Dementia in other diseases classified elsewhere without behavioral disturbance: Secondary | ICD-10-CM | POA: Diagnosis not present

## 2019-07-27 DIAGNOSIS — I509 Heart failure, unspecified: Secondary | ICD-10-CM | POA: Diagnosis not present

## 2019-07-27 DIAGNOSIS — G309 Alzheimer's disease, unspecified: Secondary | ICD-10-CM | POA: Diagnosis not present

## 2019-07-27 DIAGNOSIS — I110 Hypertensive heart disease with heart failure: Secondary | ICD-10-CM | POA: Diagnosis not present

## 2019-07-27 DIAGNOSIS — M4800 Spinal stenosis, site unspecified: Secondary | ICD-10-CM | POA: Diagnosis not present

## 2019-08-02 ENCOUNTER — Telehealth: Payer: Self-pay

## 2019-08-02 NOTE — Telephone Encounter (Signed)
1.Medication Requested:potassium chloride (KLOR-CON) 8 MEQ tablet  2. Pharmacy (Name, Street, City):[id number] - [place], [place] [address]  3. On Med List: yes  4. Last Visit with PCP: 1.5.21  5. Next visit date with PCP:4.6.21    Agent: Please be advised that RX refills may take up to 3 business days. We ask that you follow-up with your pharmacy.

## 2019-08-03 ENCOUNTER — Other Ambulatory Visit: Payer: Self-pay

## 2019-08-03 MED ORDER — POTASSIUM CHLORIDE ER 8 MEQ PO TBCR: 8.0000 meq | EXTENDED_RELEASE_TABLET | Freq: Every morning | ORAL | 3 refills | Status: DC

## 2019-08-03 MED ORDER — OMEPRAZOLE 20 MG PO CPDR: 40.0000 mg | DELAYED_RELEASE_CAPSULE | Freq: Every day | ORAL | 3 refills | Status: DC

## 2019-08-03 NOTE — Telephone Encounter (Signed)
RX sent

## 2019-08-17 NOTE — Addendum Note (Signed)
Addended by: Karren Cobble on: 08/17/2019 02:28 PM   Modules accepted: Orders

## 2019-08-23 ENCOUNTER — Other Ambulatory Visit: Payer: Self-pay | Admitting: Neurology

## 2019-08-23 ENCOUNTER — Other Ambulatory Visit: Payer: Self-pay | Admitting: Internal Medicine

## 2019-08-25 ENCOUNTER — Other Ambulatory Visit: Payer: Self-pay | Admitting: Internal Medicine

## 2019-08-28 ENCOUNTER — Other Ambulatory Visit: Payer: Self-pay

## 2019-08-28 ENCOUNTER — Encounter: Payer: Self-pay | Admitting: Internal Medicine

## 2019-08-28 ENCOUNTER — Ambulatory Visit (INDEPENDENT_AMBULATORY_CARE_PROVIDER_SITE_OTHER): Payer: Medicare Other | Admitting: Internal Medicine

## 2019-08-28 VITALS — BP 124/78 | HR 79 | Temp 98.1°F | Ht 64.0 in

## 2019-08-28 DIAGNOSIS — M545 Low back pain, unspecified: Secondary | ICD-10-CM

## 2019-08-28 DIAGNOSIS — F0280 Dementia in other diseases classified elsewhere without behavioral disturbance: Secondary | ICD-10-CM

## 2019-08-28 DIAGNOSIS — I10 Essential (primary) hypertension: Secondary | ICD-10-CM | POA: Diagnosis not present

## 2019-08-28 DIAGNOSIS — F329 Major depressive disorder, single episode, unspecified: Secondary | ICD-10-CM

## 2019-08-28 DIAGNOSIS — G309 Alzheimer's disease, unspecified: Secondary | ICD-10-CM

## 2019-08-28 DIAGNOSIS — M48062 Spinal stenosis, lumbar region with neurogenic claudication: Secondary | ICD-10-CM

## 2019-08-28 DIAGNOSIS — F411 Generalized anxiety disorder: Secondary | ICD-10-CM | POA: Diagnosis not present

## 2019-08-28 DIAGNOSIS — M79604 Pain in right leg: Secondary | ICD-10-CM

## 2019-08-28 LAB — BASIC METABOLIC PANEL
BUN: 13 mg/dL (ref 6–23)
CO2: 31 mEq/L (ref 19–32)
Calcium: 9.1 mg/dL (ref 8.4–10.5)
Chloride: 102 mEq/L (ref 96–112)
Creatinine, Ser: 0.99 mg/dL (ref 0.40–1.20)
GFR: 53.45 mL/min — ABNORMAL LOW (ref >60.00)
Glucose, Bld: 126 mg/dL — ABNORMAL HIGH (ref 70–99)
Potassium: 4.5 mEq/L (ref 3.5–5.1)
Sodium: 139 mEq/L (ref 135–145)

## 2019-08-28 NOTE — Assessment & Plan Note (Signed)
Dementia is worse and progressing.  She has severe difficulties with short-term memory.  She has been suffering with worsening apathy.  She has not been able to learn new things.  She has been losing all skills.  She has been not completely dependent on her  Helpers, her daughter for daily routine.  She is wheelchair-bound.  She has not been not able to walk without assistance.

## 2019-08-28 NOTE — Addendum Note (Signed)
Addended by: Trenda Moots on: [id number] 09:38 AM   Modules accepted: Orders

## 2019-08-28 NOTE — Progress Notes (Addendum)
Subjective:  Patient ID: Sabrina Page, female    DOB: 1933/06/08  Age: 86 y.o. MRN: [id number]  CC: No chief complaint on file.   HPI Sabrina Page presents for FTT, dementia, spinal stenosis f/u. C/o weakness  Outpatient Medications Prior to Visit  Medication Sig Dispense Refill  . ARIPiprazole (ABILIFY) 2 MG tablet TAKE 1 TABLET BY MOUTH EVERY DAY 90 tablet 1  . aspirin 81 MG EC tablet Take 1 tablet (81 mg total) by mouth 2 (two) times daily. 60 tablet 0  . B Complex-C (SUPER B COMPLEX PO) Take 1 tablet by mouth every morning.     . Cholecalciferol 1000 UNITS capsule Take 1,000 Units by mouth every morning.     Marland Kitchen doxycycline (VIBRA-TABS) 100 MG tablet Take 1 tablet (100 mg total) by mouth 2 (two) times daily. 20 tablet 0  . fludrocortisone (FLORINEF) 0.1 MG tablet TAKE 1 TABLET BY MOUTH EVERY DAY 90 tablet 3  . FLUoxetine (PROZAC) 40 MG capsule TAKE 1 CAPSULE BY MOUTH EVERY DAY 90 capsule 3  . furosemide (LASIX) 40 MG tablet TAKE 1 TABLET (40 MG TOTAL) BY MOUTH DAILY AS NEEDED FOR EDEMA. 90 tablet 1  . ipratropium-albuterol (DUONEB) 0.5-2.5 (3) MG/3ML SOLN 1 neb every 6 hours if needed 75 mL 12  . memantine (NAMENDA) 10 MG tablet TAKE 1 TABLET BY MOUTH TWICE A DAY 180 tablet 3  . midodrine (PROAMATINE) 10 MG tablet TAKE 1 TABLET BY MOUTH THREE TIMES A DAY 270 tablet 3  . Omega-3 Fatty Acids (FISH OIL) 1000 MG CAPS Take 1 capsule by mouth every morning.     Marland Kitchen omeprazole (PRILOSEC) 20 MG capsule Take 2 capsules (40 mg total) by mouth daily. 180 capsule 3  . potassium chloride (KLOR-CON) 8 MEQ tablet Take 1 tablet (8 mEq total) by mouth every morning. 90 tablet 3  . pyridostigmine (MESTINON) 60 MG tablet TAKE 1 TABLET (60 MG TOTAL) BY MOUTH 3 (THREE) TIMES DAILY. 270 tablet 3  . traMADol (ULTRAM) 50 MG tablet Take 1 tablet (50 mg total) by mouth every 12 (twelve) hours as needed for severe pain. 100 tablet 2  . loratadine (CLARITIN) 10 MG tablet TAKE 1 TABLET BY MOUTH  EVERY DAY IN THE MORNING 90 tablet 3   No facility-administered medications prior to visit.    ROS: Review of Systems  Constitutional: Positive for fatigue. Negative for activity change, appetite change, chills and unexpected weight change.  HENT: Negative for congestion, mouth sores and sinus pressure.   Eyes: Negative for visual disturbance.  Respiratory: Positive for shortness of breath. Negative for cough and chest tightness.   Cardiovascular: Positive for leg swelling.  Gastrointestinal: Negative for abdominal pain and nausea.  Genitourinary: Positive for frequency and urgency. Negative for difficulty urinating and vaginal pain.  Musculoskeletal: Positive for arthralgias, back pain and gait problem.  Skin: Negative for pallor and rash.  Neurological: Positive for dizziness and weakness. Negative for tremors, numbness and headaches.  Psychiatric/Behavioral: Positive for behavioral problems, confusion, decreased concentration and dysphoric mood. Negative for sleep disturbance. The patient is nervous/anxious.     Objective:  BP 124/78 (BP Location: Right Arm, Patient Position: Sitting, Cuff Size: Large)   Pulse 79   Temp 98.1 F (36.7 C) (Oral)   Ht 5\' 4"  (1.626 m)   SpO2 96%   BMI 40.85 kg/m   BP Readings from Last 3 Encounters:  08/28/19 124/78  05/29/19 112/66  03/16/19 110/65    Wt Readings from Last 3 Encounters:  03/16/19 238 lb (108 kg)  02/27/19 238 lb (108 kg)  01/18/19 242 lb (109.8 kg)    Physical Exam Constitutional:      General: She is not in acute distress.    Appearance: She is well-developed. She is obese.  HENT:     Head: Normocephalic.     Right Ear: External ear normal.     Left Ear: External ear normal.     Nose: Nose normal.  Eyes:     General:        Right eye: No discharge.        Left eye: No discharge.     Conjunctiva/sclera: Conjunctivae normal.     Pupils: Pupils are equal, round, and reactive to light.  Neck:     Thyroid: No  thyromegaly.     Vascular: No JVD.     Trachea: No tracheal deviation.  Cardiovascular:     Rate and Rhythm: Normal rate and regular rhythm.     Heart sounds: Normal heart sounds.  Pulmonary:     Effort: No respiratory distress.     Breath sounds: No stridor. No wheezing.  Abdominal:     General: Bowel sounds are normal. There is no distension.     Palpations: Abdomen is soft. There is no mass.     Tenderness: There is no abdominal tenderness. There is no guarding or rebound.  Musculoskeletal:        General: Tenderness present.     Cervical back: Normal range of motion and neck supple.     Right lower leg: Edema present.     Left lower leg: Edema present.  Lymphadenopathy:     Cervical: No cervical adenopathy.  Skin:    Findings: No erythema or rash.  Neurological:     Mental Status: She is oriented to person, place, and time.     Cranial Nerves: No cranial nerve deficit.     Motor: Weakness present. No abnormal muscle tone.     Coordination: Coordination abnormal.     Gait: Gait abnormal.     Deep Tendon Reflexes: Reflexes normal.  Psychiatric:        Behavior: Behavior normal.        Thought Content: Thought content normal.        Judgment: Judgment normal.    The pt needs a hospital bed at home, gel pad due to spinal stenosis, dementia, obesity.  The patient requires repositioning of the body in ways that cannot be achieved with an ordinary bed or wedge pillow to eliminate pain, reduce pressure, treat pressure sores.  The patient cannot change positions on her own in bed due to weakness, dementia, spinal stenosis with leg weakness.  She is morbidly obese which makes it very hard for caregivers to help her in or out of a regular bed.  The patient requires the head of the bed to be elevated more than 30 degrees most of the time due to congestive heart failure and asthma.    Lab Results  Component Value Date   WBC 7.1 01/18/2019   HGB 14.1 01/18/2019   HCT 42.7  01/18/2019   PLT 243.0 01/18/2019   GLUCOSE 173 (H) 05/29/2019   CHOL 270 (H) 06/12/2014   TRIG 132.0 06/12/2014   HDL 52.40 06/12/2014   LDLDIRECT 159.1 05/16/2012   LDLCALC 191 (H) 06/12/2014   ALT 10 05/29/2019   AST 12 05/29/2019   NA 139 05/29/2019   K 4.0 05/29/2019   CL 102 05/29/2019  CREATININE 1.00 05/29/2019   BUN 14 05/29/2019   CO2 30 05/29/2019   TSH 2.09 01/18/2019   INR 1.07 04/16/2016   HGBA1C 5.4 05/29/2019    DG Abd 1 View  Result Date: 01/18/2019 CLINICAL DATA:  Right flank pain.  Constipation. EXAM: ABDOMEN - 1 VIEW COMPARISON:  None. FINDINGS: The bowel gas pattern is normal. Status post cholecystectomy. Moderate amount of stool seen throughout the colon. No radio-opaque calculi or other significant radiographic abnormality are seen. IMPRESSION: No evidence of bowel obstruction or ileus. Moderate stool burden. No definite nephrolithiasis is noted. Electronically Signed   By: Marijo Conception M.D.   On: 01/18/2019 14:58    Assessment & Plan:    [name], MD

## 2019-08-28 NOTE — Assessment & Plan Note (Signed)
Home PT Tramadol prn W/c Hosp bed Rx

## 2019-08-28 NOTE — Assessment & Plan Note (Signed)
Prozac, Abilify

## 2019-08-28 NOTE — Assessment & Plan Note (Signed)
Furosemide prn 

## 2019-08-28 NOTE — Assessment & Plan Note (Signed)
On Fluoxetine and Abilify  Potential benefits of a long term benzodiazepines  use as well as potential risks  and complications were explained to the patient and were aknowledged.

## 2019-09-11 DIAGNOSIS — F0280 Dementia in other diseases classified elsewhere without behavioral disturbance: Secondary | ICD-10-CM | POA: Diagnosis not present

## 2019-09-11 DIAGNOSIS — J4540 Moderate persistent asthma, uncomplicated: Secondary | ICD-10-CM | POA: Diagnosis not present

## 2019-09-11 DIAGNOSIS — E785 Hyperlipidemia, unspecified: Secondary | ICD-10-CM | POA: Diagnosis not present

## 2019-09-11 DIAGNOSIS — F411 Generalized anxiety disorder: Secondary | ICD-10-CM | POA: Diagnosis not present

## 2019-09-11 DIAGNOSIS — R32 Unspecified urinary incontinence: Secondary | ICD-10-CM | POA: Diagnosis not present

## 2019-09-11 DIAGNOSIS — K219 Gastro-esophageal reflux disease without esophagitis: Secondary | ICD-10-CM | POA: Diagnosis not present

## 2019-09-11 DIAGNOSIS — Z87440 Personal history of urinary (tract) infections: Secondary | ICD-10-CM | POA: Diagnosis not present

## 2019-09-11 DIAGNOSIS — I110 Hypertensive heart disease with heart failure: Secondary | ICD-10-CM | POA: Diagnosis not present

## 2019-09-11 DIAGNOSIS — K5790 Diverticulosis of intestine, part unspecified, without perforation or abscess without bleeding: Secondary | ICD-10-CM | POA: Diagnosis not present

## 2019-09-11 DIAGNOSIS — I509 Heart failure, unspecified: Secondary | ICD-10-CM | POA: Diagnosis not present

## 2019-09-11 DIAGNOSIS — M1990 Unspecified osteoarthritis, unspecified site: Secondary | ICD-10-CM | POA: Diagnosis not present

## 2019-09-11 DIAGNOSIS — G4700 Insomnia, unspecified: Secondary | ICD-10-CM | POA: Diagnosis not present

## 2019-09-11 DIAGNOSIS — Z9181 History of falling: Secondary | ICD-10-CM | POA: Diagnosis not present

## 2019-09-11 DIAGNOSIS — Z7982 Long term (current) use of aspirin: Secondary | ICD-10-CM | POA: Diagnosis not present

## 2019-09-11 DIAGNOSIS — J9611 Chronic respiratory failure with hypoxia: Secondary | ICD-10-CM | POA: Diagnosis not present

## 2019-09-11 DIAGNOSIS — I951 Orthostatic hypotension: Secondary | ICD-10-CM | POA: Diagnosis not present

## 2019-09-11 DIAGNOSIS — M48062 Spinal stenosis, lumbar region with neurogenic claudication: Secondary | ICD-10-CM | POA: Diagnosis not present

## 2019-09-11 DIAGNOSIS — I872 Venous insufficiency (chronic) (peripheral): Secondary | ICD-10-CM | POA: Diagnosis not present

## 2019-09-11 DIAGNOSIS — G4733 Obstructive sleep apnea (adult) (pediatric): Secondary | ICD-10-CM | POA: Diagnosis not present

## 2019-09-11 DIAGNOSIS — Z87891 Personal history of nicotine dependence: Secondary | ICD-10-CM | POA: Diagnosis not present

## 2019-09-11 DIAGNOSIS — F329 Major depressive disorder, single episode, unspecified: Secondary | ICD-10-CM | POA: Diagnosis not present

## 2019-09-11 DIAGNOSIS — G309 Alzheimer's disease, unspecified: Secondary | ICD-10-CM | POA: Diagnosis not present

## 2019-09-14 ENCOUNTER — Other Ambulatory Visit: Payer: Self-pay

## 2019-09-14 ENCOUNTER — Ambulatory Visit (INDEPENDENT_AMBULATORY_CARE_PROVIDER_SITE_OTHER): Payer: Medicare Other | Admitting: Neurology

## 2019-09-14 ENCOUNTER — Encounter: Payer: Self-pay | Admitting: Neurology

## 2019-09-14 VITALS — BP 116/73 | HR 86 | Ht 64.0 in

## 2019-09-14 DIAGNOSIS — J4540 Moderate persistent asthma, uncomplicated: Secondary | ICD-10-CM | POA: Diagnosis not present

## 2019-09-14 DIAGNOSIS — F0280 Dementia in other diseases classified elsewhere without behavioral disturbance: Secondary | ICD-10-CM | POA: Diagnosis not present

## 2019-09-14 DIAGNOSIS — I951 Orthostatic hypotension: Secondary | ICD-10-CM | POA: Diagnosis not present

## 2019-09-14 DIAGNOSIS — G309 Alzheimer's disease, unspecified: Secondary | ICD-10-CM | POA: Diagnosis not present

## 2019-09-14 DIAGNOSIS — I509 Heart failure, unspecified: Secondary | ICD-10-CM | POA: Diagnosis not present

## 2019-09-14 DIAGNOSIS — M48062 Spinal stenosis, lumbar region with neurogenic claudication: Secondary | ICD-10-CM | POA: Diagnosis not present

## 2019-09-14 DIAGNOSIS — I110 Hypertensive heart disease with heart failure: Secondary | ICD-10-CM | POA: Diagnosis not present

## 2019-09-14 MED ORDER — MIDODRINE HCL 10 MG PO TABS: 5.0000 mg | ORAL_TABLET | Freq: Two times a day (BID) | ORAL | 3 refills | Status: DC

## 2019-09-14 NOTE — Patient Instructions (Signed)
Return to clinic in 6 months.

## 2019-09-14 NOTE — Progress Notes (Signed)
Asking about midodrine and florinef taken together Unable to get urine for UA?

## 2019-09-14 NOTE — Progress Notes (Signed)
Follow-up Visit   Date: 09/14/19    Sabrina Page MRN: [id number] DOB: Jan 29, 1934   Interim History: Sabrina Page is a 86 y.o.  female with ashtma, GERD, depression, hyperlipidemia, CKD, and peripheral vascular disease returning to the clinic for follow-up of orthostasis and Alzheimer's dementia.  The patient was accompanied to the clinic by daughter and [place] Interpretor.  History of present illness: She has a long history of orthostatic hypotension starting in the spring 2017.  Midodrine 10 mg twice daily was started which helped.  Over the next year, Florinef 0.1 mg daily and Mestinon 60 mg 3 times daily was also added.  Overall, the combination of these medications has helped her orthostasis, however in 2020 she began having supine hypertension, so midodrine was reduced to '5mg'$  BID in the fall of 2020.   Starting around 2016, patient began having memory loss which has progressed over the past few years. Her daughter is POA and manages medications and finances.  Mood and sleep is good.  She was referred to palliative care in late 2020.  UPDATE 09/14/2019:  She is here for follow-up visit.  There continues to be slow deterioration in general wellbeing and more dependent on caregiver support.  She is dependent for all ADLs, except feeding.  She has not suffered any falls and had near-syncope only 1-2 times, when standing up.   Overall, her orthostasis has been much better, partly because she has become much more sedentary.  With respect to her memory, daughter continues to observe slow decline in memory.  She does not have any behavior issues and feels that she is less agitated and suspicious than before.  Daughter was able to find caregiver support through a holocaust support grant and gets 60-hr caregiver support for each parent.  There is no support overnight, but she has in-home video monitoring.   Medications:  Current Outpatient Medications on File Prior to Visit    Medication Sig Dispense Refill  . ARIPiprazole (ABILIFY) 2 MG tablet TAKE 1 TABLET BY MOUTH EVERY DAY 90 tablet 1  . aspirin 81 MG EC tablet Take 1 tablet (81 mg total) by mouth 2 (two) times daily. 60 tablet 0  . B Complex-C (SUPER B COMPLEX PO) Take 1 tablet by mouth every morning.     . Cholecalciferol 1000 UNITS capsule Take 1,000 Units by mouth every morning.     . fludrocortisone (FLORINEF) 0.1 MG tablet TAKE 1 TABLET BY MOUTH EVERY DAY 90 tablet 3  . FLUoxetine (PROZAC) 40 MG capsule TAKE 1 CAPSULE BY MOUTH EVERY DAY 90 capsule 3  . furosemide (LASIX) 40 MG tablet TAKE 1 TABLET (40 MG TOTAL) BY MOUTH DAILY AS NEEDED FOR EDEMA. 90 tablet 1  . memantine (NAMENDA) 10 MG tablet TAKE 1 TABLET BY MOUTH TWICE A DAY 180 tablet 3  . midodrine (PROAMATINE) 10 MG tablet TAKE 1 TABLET BY MOUTH THREE TIMES A DAY 270 tablet 3  . Omega-3 Fatty Acids (FISH OIL) 1000 MG CAPS Take 1 capsule by mouth every morning.     Marland Kitchen omeprazole (PRILOSEC) 20 MG capsule Take 2 capsules (40 mg total) by mouth daily. 180 capsule 3  . potassium chloride (KLOR-CON) 8 MEQ tablet Take 1 tablet (8 mEq total) by mouth every morning. 90 tablet 3  . pyridostigmine (MESTINON) 60 MG tablet TAKE 1 TABLET (60 MG TOTAL) BY MOUTH 3 (THREE) TIMES DAILY. 270 tablet 3  . traMADol (ULTRAM) 50 MG tablet Take 1 tablet (50 mg total) by  mouth every 12 (twelve) hours as needed for severe pain. 100 tablet 2  . ipratropium-albuterol (DUONEB) 0.5-2.5 (3) MG/3ML SOLN 1 neb every 6 hours if needed (Patient not taking: Reported on 09/14/2019) 75 mL 12   No current facility-administered medications on file prior to visit.    Allergies:  Allergies  Allergen Reactions  . Atorvastatin     REACTION: upset stomach  . Enalapril Maleate   . Hctz [Hydrochlorothiazide]     Dizzy   . Lovastatin     REACTION: tongue stress  . Namenda [Memantine Hcl]     Feeling bad  . Simvastatin     REACTION: mouth sores  . Topamax [Topiramate]     syncope     Vital Signs:  BP 116/73   Pulse 86   Ht '5\' 4"'$  (1.626 m)   SpO2 97%   BMI 40.85 kg/m   General Medical Exam:   General:  Well appearing, comfortably sitting in wheelchair Ext:  Marked bilateral leg edema  Neurological Exam: MENTAL STATUS including orientation to self.  Correctly identifies daughter.  She will follow simple commands and answer direct questions, but does not engage in conversation  CRANIAL NERVES:   Normal conjugate, extra-ocular eye movements in all directions of gaze.  No ptosis.   MOTOR:  Motor strength is 5/5 in all extremities.   COORDINATION/GAIT: Gait was not tested, patient arrived in wheelchair.  Finger tapping intact.   Data: US carotids 07/28/2015:  1-39% bilateral ICA stenosis  Labs 02/17/2016:  vitamin B12 556, vitamin B6 32.7,  SSA/B neg, copper 109, SPEP with IFE no M protein, ESR 15  MRI brain wo contrast 03/02/2016:  No acute infarct or intracranial hemorrhage.  Mild chronic microvascular changes. Moderate global atrophy. Ventricular prominence probably secondary to central atrophy rather than hydrocephalus. No intracranial mass lesion noted on this unenhanced exam. Mild cervical spondylotic changes C4-5 incompletely assessed.  IMPRESSION/PLAN: 1.  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    - Continue midodrine '5mg'$  BID  - Continue florinef 0.'1mg'$  daily  - Continue mestinon '60mg'$  TID  - Continue knee high compression stockings  2.  Alzheimer's dementia without behavior changes, slow progression as expected.  She is getting in-home caregiver and also palliative medicine.  Daughter was asking about in-home DME such as hospital bed and chair, which I advised that she discuss with palliative care team who can make recommendations based on her need. She will be continued on Namenda '10mg'$  BID.    Return to clinic in 6 months   Thank you for allowing me to  participate in patient's care.  If I can answer any additional questions, I would be pleased to do so.    Sincerely,    Aeliana Spates K. Posey Pronto, DO

## 2019-09-17 DIAGNOSIS — J4540 Moderate persistent asthma, uncomplicated: Secondary | ICD-10-CM | POA: Diagnosis not present

## 2019-09-17 DIAGNOSIS — I110 Hypertensive heart disease with heart failure: Secondary | ICD-10-CM | POA: Diagnosis not present

## 2019-09-17 DIAGNOSIS — I509 Heart failure, unspecified: Secondary | ICD-10-CM | POA: Diagnosis not present

## 2019-09-17 DIAGNOSIS — M48062 Spinal stenosis, lumbar region with neurogenic claudication: Secondary | ICD-10-CM | POA: Diagnosis not present

## 2019-09-17 DIAGNOSIS — F0280 Dementia in other diseases classified elsewhere without behavioral disturbance: Secondary | ICD-10-CM | POA: Diagnosis not present

## 2019-09-17 DIAGNOSIS — G309 Alzheimer's disease, unspecified: Secondary | ICD-10-CM | POA: Diagnosis not present

## 2019-09-21 DIAGNOSIS — M48062 Spinal stenosis, lumbar region with neurogenic claudication: Secondary | ICD-10-CM | POA: Diagnosis not present

## 2019-09-21 DIAGNOSIS — F0280 Dementia in other diseases classified elsewhere without behavioral disturbance: Secondary | ICD-10-CM | POA: Diagnosis not present

## 2019-09-21 DIAGNOSIS — I110 Hypertensive heart disease with heart failure: Secondary | ICD-10-CM | POA: Diagnosis not present

## 2019-09-21 DIAGNOSIS — J4540 Moderate persistent asthma, uncomplicated: Secondary | ICD-10-CM | POA: Diagnosis not present

## 2019-09-21 DIAGNOSIS — G309 Alzheimer's disease, unspecified: Secondary | ICD-10-CM | POA: Diagnosis not present

## 2019-09-21 DIAGNOSIS — I509 Heart failure, unspecified: Secondary | ICD-10-CM | POA: Diagnosis not present

## 2019-09-24 DIAGNOSIS — F0280 Dementia in other diseases classified elsewhere without behavioral disturbance: Secondary | ICD-10-CM | POA: Diagnosis not present

## 2019-09-24 DIAGNOSIS — J4540 Moderate persistent asthma, uncomplicated: Secondary | ICD-10-CM | POA: Diagnosis not present

## 2019-09-24 DIAGNOSIS — M48062 Spinal stenosis, lumbar region with neurogenic claudication: Secondary | ICD-10-CM | POA: Diagnosis not present

## 2019-09-24 DIAGNOSIS — G309 Alzheimer's disease, unspecified: Secondary | ICD-10-CM | POA: Diagnosis not present

## 2019-09-24 DIAGNOSIS — I509 Heart failure, unspecified: Secondary | ICD-10-CM | POA: Diagnosis not present

## 2019-09-24 DIAGNOSIS — I110 Hypertensive heart disease with heart failure: Secondary | ICD-10-CM | POA: Diagnosis not present

## 2019-09-26 DIAGNOSIS — Z7982 Long term (current) use of aspirin: Secondary | ICD-10-CM

## 2019-09-26 DIAGNOSIS — Z87440 Personal history of urinary (tract) infections: Secondary | ICD-10-CM

## 2019-09-26 DIAGNOSIS — F0280 Dementia in other diseases classified elsewhere without behavioral disturbance: Secondary | ICD-10-CM

## 2019-09-26 DIAGNOSIS — K5790 Diverticulosis of intestine, part unspecified, without perforation or abscess without bleeding: Secondary | ICD-10-CM

## 2019-09-26 DIAGNOSIS — Z9181 History of falling: Secondary | ICD-10-CM

## 2019-09-26 DIAGNOSIS — E7800 Pure hypercholesterolemia, unspecified: Secondary | ICD-10-CM | POA: Diagnosis not present

## 2019-09-26 DIAGNOSIS — I110 Hypertensive heart disease with heart failure: Secondary | ICD-10-CM

## 2019-09-26 DIAGNOSIS — R32 Unspecified urinary incontinence: Secondary | ICD-10-CM

## 2019-09-26 DIAGNOSIS — F411 Generalized anxiety disorder: Secondary | ICD-10-CM

## 2019-09-26 DIAGNOSIS — G309 Alzheimer's disease, unspecified: Secondary | ICD-10-CM

## 2019-09-26 DIAGNOSIS — J4540 Moderate persistent asthma, uncomplicated: Secondary | ICD-10-CM

## 2019-09-26 DIAGNOSIS — J9611 Chronic respiratory failure with hypoxia: Secondary | ICD-10-CM

## 2019-09-26 DIAGNOSIS — M1990 Unspecified osteoarthritis, unspecified site: Secondary | ICD-10-CM | POA: Diagnosis not present

## 2019-09-26 DIAGNOSIS — M48062 Spinal stenosis, lumbar region with neurogenic claudication: Secondary | ICD-10-CM

## 2019-09-26 DIAGNOSIS — I509 Heart failure, unspecified: Secondary | ICD-10-CM

## 2019-09-26 DIAGNOSIS — F329 Major depressive disorder, single episode, unspecified: Secondary | ICD-10-CM

## 2019-09-28 ENCOUNTER — Telehealth: Payer: Self-pay | Admitting: Internal Medicine

## 2019-09-28 DIAGNOSIS — J4540 Moderate persistent asthma, uncomplicated: Secondary | ICD-10-CM | POA: Diagnosis not present

## 2019-09-28 DIAGNOSIS — I509 Heart failure, unspecified: Secondary | ICD-10-CM | POA: Diagnosis not present

## 2019-09-28 DIAGNOSIS — F0280 Dementia in other diseases classified elsewhere without behavioral disturbance: Secondary | ICD-10-CM | POA: Diagnosis not present

## 2019-09-28 DIAGNOSIS — G309 Alzheimer's disease, unspecified: Secondary | ICD-10-CM | POA: Diagnosis not present

## 2019-09-28 DIAGNOSIS — I110 Hypertensive heart disease with heart failure: Secondary | ICD-10-CM | POA: Diagnosis not present

## 2019-09-28 DIAGNOSIS — M48062 Spinal stenosis, lumbar region with neurogenic claudication: Secondary | ICD-10-CM | POA: Diagnosis not present

## 2019-09-28 NOTE — Telephone Encounter (Addendum)
  [facility] calling requesting order be re-faxed. Advised caller I see a signed form was scanned, however they do not have it Please fax order # [phone] to [facility]   Fax [phone] Attn [phone]

## 2019-10-01 DIAGNOSIS — F0280 Dementia in other diseases classified elsewhere without behavioral disturbance: Secondary | ICD-10-CM | POA: Diagnosis not present

## 2019-10-01 DIAGNOSIS — I110 Hypertensive heart disease with heart failure: Secondary | ICD-10-CM | POA: Diagnosis not present

## 2019-10-01 DIAGNOSIS — J4540 Moderate persistent asthma, uncomplicated: Secondary | ICD-10-CM | POA: Diagnosis not present

## 2019-10-01 DIAGNOSIS — M48062 Spinal stenosis, lumbar region with neurogenic claudication: Secondary | ICD-10-CM | POA: Diagnosis not present

## 2019-10-01 DIAGNOSIS — G309 Alzheimer's disease, unspecified: Secondary | ICD-10-CM | POA: Diagnosis not present

## 2019-10-01 DIAGNOSIS — I509 Heart failure, unspecified: Secondary | ICD-10-CM | POA: Diagnosis not present

## 2019-10-02 NOTE — Telephone Encounter (Signed)
re-faxed

## 2019-10-05 DIAGNOSIS — J4540 Moderate persistent asthma, uncomplicated: Secondary | ICD-10-CM | POA: Diagnosis not present

## 2019-10-05 DIAGNOSIS — G309 Alzheimer's disease, unspecified: Secondary | ICD-10-CM | POA: Diagnosis not present

## 2019-10-05 DIAGNOSIS — F0280 Dementia in other diseases classified elsewhere without behavioral disturbance: Secondary | ICD-10-CM | POA: Diagnosis not present

## 2019-10-05 DIAGNOSIS — I509 Heart failure, unspecified: Secondary | ICD-10-CM | POA: Diagnosis not present

## 2019-10-05 DIAGNOSIS — M48062 Spinal stenosis, lumbar region with neurogenic claudication: Secondary | ICD-10-CM | POA: Diagnosis not present

## 2019-10-05 DIAGNOSIS — I110 Hypertensive heart disease with heart failure: Secondary | ICD-10-CM | POA: Diagnosis not present

## 2019-10-08 DIAGNOSIS — J4540 Moderate persistent asthma, uncomplicated: Secondary | ICD-10-CM | POA: Diagnosis not present

## 2019-10-08 DIAGNOSIS — F0280 Dementia in other diseases classified elsewhere without behavioral disturbance: Secondary | ICD-10-CM | POA: Diagnosis not present

## 2019-10-08 DIAGNOSIS — I110 Hypertensive heart disease with heart failure: Secondary | ICD-10-CM | POA: Diagnosis not present

## 2019-10-08 DIAGNOSIS — I509 Heart failure, unspecified: Secondary | ICD-10-CM | POA: Diagnosis not present

## 2019-10-08 DIAGNOSIS — M48062 Spinal stenosis, lumbar region with neurogenic claudication: Secondary | ICD-10-CM | POA: Diagnosis not present

## 2019-10-08 DIAGNOSIS — G309 Alzheimer's disease, unspecified: Secondary | ICD-10-CM | POA: Diagnosis not present

## 2019-10-11 DIAGNOSIS — G4700 Insomnia, unspecified: Secondary | ICD-10-CM | POA: Diagnosis not present

## 2019-10-11 DIAGNOSIS — E785 Hyperlipidemia, unspecified: Secondary | ICD-10-CM | POA: Diagnosis not present

## 2019-10-11 DIAGNOSIS — Z9181 History of falling: Secondary | ICD-10-CM | POA: Diagnosis not present

## 2019-10-11 DIAGNOSIS — I509 Heart failure, unspecified: Secondary | ICD-10-CM | POA: Diagnosis not present

## 2019-10-11 DIAGNOSIS — I110 Hypertensive heart disease with heart failure: Secondary | ICD-10-CM | POA: Diagnosis not present

## 2019-10-11 DIAGNOSIS — G309 Alzheimer's disease, unspecified: Secondary | ICD-10-CM | POA: Diagnosis not present

## 2019-10-11 DIAGNOSIS — K5790 Diverticulosis of intestine, part unspecified, without perforation or abscess without bleeding: Secondary | ICD-10-CM | POA: Diagnosis not present

## 2019-10-11 DIAGNOSIS — F411 Generalized anxiety disorder: Secondary | ICD-10-CM | POA: Diagnosis not present

## 2019-10-11 DIAGNOSIS — Z87891 Personal history of nicotine dependence: Secondary | ICD-10-CM | POA: Diagnosis not present

## 2019-10-11 DIAGNOSIS — F329 Major depressive disorder, single episode, unspecified: Secondary | ICD-10-CM | POA: Diagnosis not present

## 2019-10-11 DIAGNOSIS — R32 Unspecified urinary incontinence: Secondary | ICD-10-CM | POA: Diagnosis not present

## 2019-10-11 DIAGNOSIS — I872 Venous insufficiency (chronic) (peripheral): Secondary | ICD-10-CM | POA: Diagnosis not present

## 2019-10-11 DIAGNOSIS — Z7982 Long term (current) use of aspirin: Secondary | ICD-10-CM | POA: Diagnosis not present

## 2019-10-11 DIAGNOSIS — M48062 Spinal stenosis, lumbar region with neurogenic claudication: Secondary | ICD-10-CM | POA: Diagnosis not present

## 2019-10-11 DIAGNOSIS — J9611 Chronic respiratory failure with hypoxia: Secondary | ICD-10-CM | POA: Diagnosis not present

## 2019-10-11 DIAGNOSIS — F0280 Dementia in other diseases classified elsewhere without behavioral disturbance: Secondary | ICD-10-CM | POA: Diagnosis not present

## 2019-10-11 DIAGNOSIS — M1990 Unspecified osteoarthritis, unspecified site: Secondary | ICD-10-CM | POA: Diagnosis not present

## 2019-10-11 DIAGNOSIS — J4540 Moderate persistent asthma, uncomplicated: Secondary | ICD-10-CM | POA: Diagnosis not present

## 2019-10-11 DIAGNOSIS — G4733 Obstructive sleep apnea (adult) (pediatric): Secondary | ICD-10-CM | POA: Diagnosis not present

## 2019-10-11 DIAGNOSIS — Z87440 Personal history of urinary (tract) infections: Secondary | ICD-10-CM | POA: Diagnosis not present

## 2019-10-11 DIAGNOSIS — I951 Orthostatic hypotension: Secondary | ICD-10-CM | POA: Diagnosis not present

## 2019-10-11 DIAGNOSIS — K219 Gastro-esophageal reflux disease without esophagitis: Secondary | ICD-10-CM | POA: Diagnosis not present

## 2019-10-12 DIAGNOSIS — I509 Heart failure, unspecified: Secondary | ICD-10-CM | POA: Diagnosis not present

## 2019-10-12 DIAGNOSIS — J4540 Moderate persistent asthma, uncomplicated: Secondary | ICD-10-CM | POA: Diagnosis not present

## 2019-10-12 DIAGNOSIS — M48062 Spinal stenosis, lumbar region with neurogenic claudication: Secondary | ICD-10-CM | POA: Diagnosis not present

## 2019-10-12 DIAGNOSIS — I110 Hypertensive heart disease with heart failure: Secondary | ICD-10-CM | POA: Diagnosis not present

## 2019-10-12 DIAGNOSIS — G309 Alzheimer's disease, unspecified: Secondary | ICD-10-CM | POA: Diagnosis not present

## 2019-10-12 DIAGNOSIS — F0280 Dementia in other diseases classified elsewhere without behavioral disturbance: Secondary | ICD-10-CM | POA: Diagnosis not present

## 2019-10-15 DIAGNOSIS — J4540 Moderate persistent asthma, uncomplicated: Secondary | ICD-10-CM | POA: Diagnosis not present

## 2019-10-15 DIAGNOSIS — G309 Alzheimer's disease, unspecified: Secondary | ICD-10-CM | POA: Diagnosis not present

## 2019-10-15 DIAGNOSIS — F0280 Dementia in other diseases classified elsewhere without behavioral disturbance: Secondary | ICD-10-CM | POA: Diagnosis not present

## 2019-10-15 DIAGNOSIS — I509 Heart failure, unspecified: Secondary | ICD-10-CM | POA: Diagnosis not present

## 2019-10-15 DIAGNOSIS — I110 Hypertensive heart disease with heart failure: Secondary | ICD-10-CM | POA: Diagnosis not present

## 2019-10-15 DIAGNOSIS — M48062 Spinal stenosis, lumbar region with neurogenic claudication: Secondary | ICD-10-CM | POA: Diagnosis not present

## 2019-10-19 DIAGNOSIS — G309 Alzheimer's disease, unspecified: Secondary | ICD-10-CM | POA: Diagnosis not present

## 2019-10-19 DIAGNOSIS — I110 Hypertensive heart disease with heart failure: Secondary | ICD-10-CM | POA: Diagnosis not present

## 2019-10-19 DIAGNOSIS — J4540 Moderate persistent asthma, uncomplicated: Secondary | ICD-10-CM | POA: Diagnosis not present

## 2019-10-19 DIAGNOSIS — I509 Heart failure, unspecified: Secondary | ICD-10-CM | POA: Diagnosis not present

## 2019-10-19 DIAGNOSIS — F0280 Dementia in other diseases classified elsewhere without behavioral disturbance: Secondary | ICD-10-CM | POA: Diagnosis not present

## 2019-10-19 DIAGNOSIS — M48062 Spinal stenosis, lumbar region with neurogenic claudication: Secondary | ICD-10-CM | POA: Diagnosis not present

## 2019-10-22 DIAGNOSIS — F0280 Dementia in other diseases classified elsewhere without behavioral disturbance: Secondary | ICD-10-CM | POA: Diagnosis not present

## 2019-10-22 DIAGNOSIS — M48062 Spinal stenosis, lumbar region with neurogenic claudication: Secondary | ICD-10-CM | POA: Diagnosis not present

## 2019-10-22 DIAGNOSIS — G309 Alzheimer's disease, unspecified: Secondary | ICD-10-CM | POA: Diagnosis not present

## 2019-10-22 DIAGNOSIS — I110 Hypertensive heart disease with heart failure: Secondary | ICD-10-CM | POA: Diagnosis not present

## 2019-10-22 DIAGNOSIS — I509 Heart failure, unspecified: Secondary | ICD-10-CM | POA: Diagnosis not present

## 2019-10-22 DIAGNOSIS — J4540 Moderate persistent asthma, uncomplicated: Secondary | ICD-10-CM | POA: Diagnosis not present

## 2019-10-25 ENCOUNTER — Telehealth: Payer: Self-pay

## 2019-10-25 DIAGNOSIS — F0280 Dementia in other diseases classified elsewhere without behavioral disturbance: Secondary | ICD-10-CM

## 2019-10-25 DIAGNOSIS — M8949 Other hypertrophic osteoarthropathy, multiple sites: Secondary | ICD-10-CM

## 2019-10-25 DIAGNOSIS — R296 Repeated falls: Secondary | ICD-10-CM

## 2019-10-25 DIAGNOSIS — M159 Polyosteoarthritis, unspecified: Secondary | ICD-10-CM

## 2019-10-25 NOTE — Telephone Encounter (Signed)
New message   [employer]  Need a call back to discuss the gel overlay mattress  & DME  hospital bed.

## 2019-10-25 NOTE — Telephone Encounter (Addendum)
    Nancy Marus [phone]) from [facility] calling on behalf of the patient to request order for DME, hospital bed be sent to [facility] and put into EPIC   What type of DME ([facility]) would like your provider to order? Hospital bd  Who would like to get the DME from? [facility]  Last visit with PCP (>3 months requires and appointment for insurance to cover the cost = please schedule patient for visit to discuss medical necessity for DME)? 08/28/19

## 2019-10-26 DIAGNOSIS — I509 Heart failure, unspecified: Secondary | ICD-10-CM | POA: Diagnosis not present

## 2019-10-26 DIAGNOSIS — J4540 Moderate persistent asthma, uncomplicated: Secondary | ICD-10-CM | POA: Diagnosis not present

## 2019-10-26 DIAGNOSIS — G309 Alzheimer's disease, unspecified: Secondary | ICD-10-CM | POA: Diagnosis not present

## 2019-10-26 DIAGNOSIS — F0280 Dementia in other diseases classified elsewhere without behavioral disturbance: Secondary | ICD-10-CM | POA: Diagnosis not present

## 2019-10-26 DIAGNOSIS — I110 Hypertensive heart disease with heart failure: Secondary | ICD-10-CM | POA: Diagnosis not present

## 2019-10-26 DIAGNOSIS — M48062 Spinal stenosis, lumbar region with neurogenic claudication: Secondary | ICD-10-CM | POA: Diagnosis not present

## 2019-10-26 NOTE — Telephone Encounter (Signed)
MD is out of the office will forward to his desktop to review when he return back on Monday.Marland KitchenJohny Chess

## 2019-10-27 NOTE — Telephone Encounter (Signed)
Okay.  Thanks.

## 2019-10-29 DIAGNOSIS — I509 Heart failure, unspecified: Secondary | ICD-10-CM | POA: Diagnosis not present

## 2019-10-29 DIAGNOSIS — M48062 Spinal stenosis, lumbar region with neurogenic claudication: Secondary | ICD-10-CM | POA: Diagnosis not present

## 2019-10-29 DIAGNOSIS — G309 Alzheimer's disease, unspecified: Secondary | ICD-10-CM | POA: Diagnosis not present

## 2019-10-29 DIAGNOSIS — I110 Hypertensive heart disease with heart failure: Secondary | ICD-10-CM | POA: Diagnosis not present

## 2019-10-29 DIAGNOSIS — F0280 Dementia in other diseases classified elsewhere without behavioral disturbance: Secondary | ICD-10-CM | POA: Diagnosis not present

## 2019-10-29 DIAGNOSIS — J4540 Moderate persistent asthma, uncomplicated: Secondary | ICD-10-CM | POA: Diagnosis not present

## 2019-10-29 NOTE — Telephone Encounter (Signed)
Per chart DME for hosp bed was already placed on 08/20/19. Re faxed to [facility].Marland KitchenJohny Chess

## 2019-10-31 NOTE — Telephone Encounter (Signed)
Order is pending. Please enter pertinent additional information, what exactly patient needs and reasons why patient requires the bed and DME under comments for required for insurance in the "comments" section.

## 2019-11-01 NOTE — Telephone Encounter (Signed)
Singed order faxed to [facility] @ [phone] and Beverlee Nims informed.

## 2019-11-01 NOTE — Telephone Encounter (Signed)
Okay.  Done.  Thanks 

## 2019-11-02 DIAGNOSIS — I509 Heart failure, unspecified: Secondary | ICD-10-CM | POA: Diagnosis not present

## 2019-11-02 DIAGNOSIS — F0280 Dementia in other diseases classified elsewhere without behavioral disturbance: Secondary | ICD-10-CM | POA: Diagnosis not present

## 2019-11-02 DIAGNOSIS — J4540 Moderate persistent asthma, uncomplicated: Secondary | ICD-10-CM | POA: Diagnosis not present

## 2019-11-02 DIAGNOSIS — I110 Hypertensive heart disease with heart failure: Secondary | ICD-10-CM | POA: Diagnosis not present

## 2019-11-02 DIAGNOSIS — G309 Alzheimer's disease, unspecified: Secondary | ICD-10-CM | POA: Diagnosis not present

## 2019-11-02 DIAGNOSIS — M48062 Spinal stenosis, lumbar region with neurogenic claudication: Secondary | ICD-10-CM | POA: Diagnosis not present

## 2019-11-05 DIAGNOSIS — F0280 Dementia in other diseases classified elsewhere without behavioral disturbance: Secondary | ICD-10-CM | POA: Diagnosis not present

## 2019-11-05 DIAGNOSIS — I110 Hypertensive heart disease with heart failure: Secondary | ICD-10-CM | POA: Diagnosis not present

## 2019-11-05 DIAGNOSIS — J4540 Moderate persistent asthma, uncomplicated: Secondary | ICD-10-CM | POA: Diagnosis not present

## 2019-11-05 DIAGNOSIS — I509 Heart failure, unspecified: Secondary | ICD-10-CM | POA: Diagnosis not present

## 2019-11-05 DIAGNOSIS — G309 Alzheimer's disease, unspecified: Secondary | ICD-10-CM | POA: Diagnosis not present

## 2019-11-05 DIAGNOSIS — M48062 Spinal stenosis, lumbar region with neurogenic claudication: Secondary | ICD-10-CM | POA: Diagnosis not present

## 2019-11-06 ENCOUNTER — Telehealth: Payer: Self-pay

## 2019-11-06 NOTE — Telephone Encounter (Deleted)
Error

## 2019-11-06 NOTE — Telephone Encounter (Signed)
° °  Crystal from [employer], states she is re-faxing a list of needed documentation  Please fax back to [phone]

## 2019-11-06 NOTE — Telephone Encounter (Signed)
08/28/19 OV addended, printed and signed by Dr. Alain Marion and faxed to [facility] @ [phone] and copy given to pt's daughter.

## 2019-11-06 NOTE — Telephone Encounter (Signed)
Dr. Alain Marion,  Please addend your 08/28/19 OV note with a narrative explaining why exactly the patient needs the hospital bed with gel overlay. According to Beverlee Nims at [facility] the patient's old bed has been moved out of her home and she has no bed to sleep in tonight.

## 2019-11-07 NOTE — Telephone Encounter (Signed)
done

## 2019-11-09 DIAGNOSIS — F0280 Dementia in other diseases classified elsewhere without behavioral disturbance: Secondary | ICD-10-CM | POA: Diagnosis not present

## 2019-11-09 DIAGNOSIS — I509 Heart failure, unspecified: Secondary | ICD-10-CM | POA: Diagnosis not present

## 2019-11-09 DIAGNOSIS — G309 Alzheimer's disease, unspecified: Secondary | ICD-10-CM | POA: Diagnosis not present

## 2019-11-09 DIAGNOSIS — J4540 Moderate persistent asthma, uncomplicated: Secondary | ICD-10-CM | POA: Diagnosis not present

## 2019-11-09 DIAGNOSIS — M48062 Spinal stenosis, lumbar region with neurogenic claudication: Secondary | ICD-10-CM | POA: Diagnosis not present

## 2019-11-09 DIAGNOSIS — I110 Hypertensive heart disease with heart failure: Secondary | ICD-10-CM | POA: Diagnosis not present

## 2019-11-16 ENCOUNTER — Telehealth: Payer: Self-pay | Admitting: Internal Medicine

## 2019-11-16 ENCOUNTER — Ambulatory Visit (INDEPENDENT_AMBULATORY_CARE_PROVIDER_SITE_OTHER): Payer: Medicare Other | Admitting: Internal Medicine

## 2019-11-16 ENCOUNTER — Other Ambulatory Visit: Payer: Self-pay

## 2019-11-16 ENCOUNTER — Encounter: Payer: Self-pay | Admitting: Internal Medicine

## 2019-11-16 VITALS — BP 130/72 | HR 86 | Temp 98.7°F

## 2019-11-16 DIAGNOSIS — I5033 Acute on chronic diastolic (congestive) heart failure: Secondary | ICD-10-CM

## 2019-11-16 DIAGNOSIS — N39490 Overflow incontinence: Secondary | ICD-10-CM

## 2019-11-16 DIAGNOSIS — I10 Essential (primary) hypertension: Secondary | ICD-10-CM | POA: Diagnosis not present

## 2019-11-16 DIAGNOSIS — R739 Hyperglycemia, unspecified: Secondary | ICD-10-CM | POA: Diagnosis not present

## 2019-11-16 MED ORDER — CIPROFLOXACIN HCL 500 MG PO TABS: 500.0000 mg | ORAL_TABLET | Freq: Two times a day (BID) | ORAL | 0 refills | Status: AC

## 2019-11-16 NOTE — Progress Notes (Signed)
Subjective:    Patient ID: Sabrina Page, female    DOB: [birth year], 86 y.o.   MRN: [id number]  HPI  Here with daughter who speaks english very well, c/o 2 days ago episode of increased swelling to the legs and weepiness, now improved with leg elevation and no weepiness and less swelling today.  Pt has been struggling with hypotension and the need for chronic diuresis - diast chf, and currently has been taking lasix prn or at least qod.  Also taking midodrine and florinef.  Pt denies chest pain, increased sob or doe, wheezing, orthopnea, PND, palpitations, dizziness or syncope, but does have chronic sob/dyspnea no change.  Pt denies polydipsia, polyuria, but has had per daughter hx of recurring uti about twice per yr, last one > 6 mo, and now with 2-3 days unusual urinary freq and incontinence that does not seem related to lasix use.  Daughter also asks for [facility] with RN assist and possible leg wraps at home as leg elevation not always effective.   Pt denies fever, wt loss, night sweats, loss of appetite, or other constitutional symptoms.  Denies urinary symptoms such as dysuria, urgency, flank pain, hematuria or n/v, fever, chills., but pt is unable to give specimen today without I&O cath and daughter was barely able to transport her by car herself today due to pt size and little assist with transfers Wt Readings from Last 3 Encounters:  03/16/19 238 lb (108 kg)  02/27/19 238 lb (108 kg)  01/18/19 242 lb (109.8 kg)   Past Medical History:  Diagnosis Date  . Allergy    rhinitis  . Asthma   . Chronic kidney disease    hx of cystitis   . COPD (chronic obstructive pulmonary disease) ([place])   . Depression   . Dizziness    vertigo   . Gallstones   . GERD (gastroesophageal reflux disease)   . Hyperlipidemia   . Hypertension   . Obesity   . Osteoarthritis   . Osteoporosis   . Peripheral vascular disease (H[facility])    swelling   . Pneumonia   . Shortness of breath    with exertion   .  Shoulder injury    left  . Sleep apnea    never diagnosed   . Urinary incontinence    Past Surgical History:  Procedure Laterality Date  . CHOLECYSTECTOMY  09/14/2011   Procedure: LAPAROSCOPIC CHOLECYSTECTOMY;  Surgeon: Odis Hollingshead, MD;  Location: [facility];  Service: General;  Laterality: N/A;  attempted intraoperative cholangiogram   . HIP ARTHROPLASTY Right 04/17/2016   Procedure: ARTHROPLASTY BIPOLAR HIP (HEMIARTHROPLASTY);  Surgeon: Paralee Cancel, MD;  Location: [facility];  Service: Orthopedics;  Laterality: Right;  . L TKR    . LIVER BIOPSY  09/14/2011   Procedure: LIVER BIOPSY;  Surgeon: Odis Hollingshead, MD;  Location: [facility];  Service: General;  Laterality: N/A;  . OTHER SURGICAL HISTORY     left leg surgery   . OTHER SURGICAL HISTORY     left breast surgery due to mastitis   . right left knee arthroplast      reports that she quit smoking about 18 years ago. Her smoking use included cigarettes. She has a 17.50 pack-year smoking history. She has never used smokeless tobacco. She reports current alcohol use. She reports that she does not use drugs. family history includes Arthritis in her mother; Diabetes in her mother; Hypertension in her father, mother, and another family member; Stroke in her father  and mother. Allergies  Allergen Reactions  . Atorvastatin     REACTION: upset stomach  . Enalapril Maleate   . Hctz [Hydrochlorothiazide]     Dizzy   . Lovastatin     REACTION: tongue stress  . Namenda [Memantine Hcl]     Feeling bad  . Simvastatin     REACTION: mouth sores  . Topamax [Topiramate]     syncope   Current Outpatient Medications on File Prior to Visit  Medication Sig Dispense Refill  . ARIPiprazole (ABILIFY) 2 MG tablet TAKE 1 TABLET BY MOUTH EVERY DAY 90 tablet 1  . aspirin 81 MG EC tablet Take 1 tablet (81 mg total) by mouth 2 (two) times daily. 60 tablet 0  . B Complex-C (SUPER B COMPLEX PO) Take 1 tablet by mouth every morning.     . Cholecalciferol  1000 UNITS capsule Take 1,000 Units by mouth every morning.     . fludrocortisone (FLORINEF) 0.1 MG tablet TAKE 1 TABLET BY MOUTH EVERY DAY 90 tablet 3  . FLUoxetine (PROZAC) 40 MG capsule TAKE 1 CAPSULE BY MOUTH EVERY DAY 90 capsule 3  . furosemide (LASIX) 40 MG tablet TAKE 1 TABLET (40 MG TOTAL) BY MOUTH DAILY AS NEEDED FOR EDEMA. 90 tablet 1  . ipratropium-albuterol (DUONEB) 0.5-2.5 (3) MG/3ML SOLN 1 neb every 6 hours if needed 75 mL 12  . memantine (NAMENDA) 10 MG tablet TAKE 1 TABLET BY MOUTH TWICE A DAY 180 tablet 3  . midodrine (PROAMATINE) 10 MG tablet Take 0.5 tablets (5 mg total) by mouth in the morning and at bedtime. 270 tablet 3  . Omega-3 Fatty Acids (FISH OIL) 1000 MG CAPS Take 1 capsule by mouth every morning.     Marland Kitchen omeprazole (PRILOSEC) 20 MG capsule Take 2 capsules (40 mg total) by mouth daily. 180 capsule 3  . potassium chloride (KLOR-CON) 8 MEQ tablet Take 1 tablet (8 mEq total) by mouth every morning. 90 tablet 3  . pyridostigmine (MESTINON) 60 MG tablet TAKE 1 TABLET (60 MG TOTAL) BY MOUTH 3 (THREE) TIMES DAILY. 270 tablet 3  . traMADol (ULTRAM) 50 MG tablet Take 1 tablet (50 mg total) by mouth every 12 (twelve) hours as needed for severe pain. 100 tablet 2   No current facility-administered medications on file prior to visit.   Review of Systems All otherwise neg per pt     Objective:   Physical Exam BP 130/72 (BP Location: Left Arm, Patient Position: Sitting, Cuff Size: Large)   Pulse 86   Temp 98.7 F (37.1 C) (Oral)   SpO2 97%  VS noted,  Constitutional: Pt appears in NAD HENT: Head: NCAT.  Right Ear: External ear normal.  Left Ear: External ear normal.  Eyes: . Pupils are equal, round, and reactive to light. Conjunctivae and EOM are normal Nose: without d/c or deformity Neck: Neck supple. Gross normal ROM Cardiovascular: Normal rate and regular rhythm.   Pulmonary/Chest: Effort normal and breath sounds without rales or wheezing.  Abd:  Soft, NT, ND, +  BS, no organomegaly Neurological: Pt is alert. At baseline orientation, motor grossly intact Skin: Skin is warm. No rashes, other new lesions, has right > left LE edema 1-2+ without weepiness or erythema or tender, no drainage Psychiatric: Pt behavior is normal without agitation  All otherwise neg per pt Lab Results  Component Value Date   WBC 7.1 01/18/2019   HGB 14.1 01/18/2019   HCT 42.7 01/18/2019   PLT 243.0 01/18/2019   GLUCOSE  126 (H) 08/28/2019   CHOL 270 (H) 06/12/2014   TRIG 132.0 06/12/2014   HDL 52.40 06/12/2014   LDLDIRECT 159.1 05/16/2012   LDLCALC 191 (H) 06/12/2014   ALT 10 05/29/2019   AST 12 05/29/2019   NA 139 08/28/2019   K 4.5 08/28/2019   CL 102 08/28/2019   CREATININE 0.99 08/28/2019   BUN 13 08/28/2019   CO2 31 08/28/2019   TSH 2.09 01/18/2019   INR 1.07 04/16/2016   HGBA1C 5.4 05/29/2019         Assessment & Plan:

## 2019-11-16 NOTE — Telephone Encounter (Signed)
Per [place] states her mom has a weeping edema on her legs and has a lot of swelling. She would like to discuss setting up a home health assessment.   -Her mom has a lots of pain in her RT knee , clear weeping fluid edema on her legs and has a lot of swelling. She is having a hard time walking. She would like to discuss setting up a home health assessment. She has no fever.  Patient advised to see pcp within 4 hours.  Patient scheduled for today (11/16/2019)  at 3:40pm with Dr Jenny Reichmann.  Please see OV notes for further information.

## 2019-11-16 NOTE — Telephone Encounter (Signed)
New message:   Pt's daughter called in and states the pt has a weeping edema and a lot of swelling in the legs. I have transferred the pt's daughter to [facility] for further evaluation. Please advise.

## 2019-11-16 NOTE — Patient Instructions (Addendum)
Please take all new medication as prescribed - the antibiotic  You will be contacted regarding the referral for: [place] with Nurse  Please continue all other medications as before, and refills have been done if requested.  Please have the pharmacy call with any other refills you may need.  Please keep your appointments with your specialists as you may have planned  Please make an Appointment to return in 1 week with Dr Alain Marion

## 2019-11-17 ENCOUNTER — Encounter: Payer: Self-pay | Admitting: Internal Medicine

## 2019-11-17 DIAGNOSIS — I5033 Acute on chronic diastolic (congestive) heart failure: Secondary | ICD-10-CM | POA: Insufficient documentation

## 2019-11-17 DIAGNOSIS — I509 Heart failure, unspecified: Secondary | ICD-10-CM | POA: Insufficient documentation

## 2019-11-17 NOTE — Assessment & Plan Note (Addendum)
Chronic issue with lasix/florinef and midodrine use concurrent and daughter struggling to maintain the balance or med use for her; ok for [facility] with RN and possible leg wraps unna boots., cont current med tx no change  I spent 41 minutes in preparing to see the patient by review of recent labs, imaging and procedures, obtaining and reviewing separately obtained history, communicating with the patient and family or caregiver, ordering medications, tests or procedures, and documenting clinical information in the EHR including the differential Dx, treatment, and any further evaluation and other management of acute on chronic dCHF, hyperglycemia, urinary incontinence, and htn/hypotension

## 2019-11-17 NOTE — Assessment & Plan Note (Signed)
stable overall by history and exam, recent data reviewed with pt, and pt to continue medical treatment as before,  to f/u any worsening symptoms or concerns  

## 2019-11-17 NOTE — Assessment & Plan Note (Signed)
Chronic persistent but with recent increased frequency, unable for urine studies today, and daughter declines take to UC or ED for I&O cath, for cipro course, but consider I&O cath per [facility] if not improved

## 2019-11-17 NOTE — Assessment & Plan Note (Signed)
On no vasoactive meds, has recent ongoing lower blood pressures but no recent falls or syncope, cont to monitor, daughter declines home PT for now

## 2019-11-24 DIAGNOSIS — Z6841 Body Mass Index (BMI) 40.0 and over, adult: Secondary | ICD-10-CM | POA: Diagnosis not present

## 2019-11-24 DIAGNOSIS — F0280 Dementia in other diseases classified elsewhere without behavioral disturbance: Secondary | ICD-10-CM | POA: Diagnosis not present

## 2019-11-24 DIAGNOSIS — J449 Chronic obstructive pulmonary disease, unspecified: Secondary | ICD-10-CM | POA: Diagnosis not present

## 2019-11-24 DIAGNOSIS — M1990 Unspecified osteoarthritis, unspecified site: Secondary | ICD-10-CM | POA: Diagnosis not present

## 2019-11-24 DIAGNOSIS — G309 Alzheimer's disease, unspecified: Secondary | ICD-10-CM | POA: Diagnosis not present

## 2019-11-24 DIAGNOSIS — I5032 Chronic diastolic (congestive) heart failure: Secondary | ICD-10-CM | POA: Diagnosis not present

## 2019-11-24 DIAGNOSIS — K5730 Diverticulosis of large intestine without perforation or abscess without bleeding: Secondary | ICD-10-CM | POA: Diagnosis not present

## 2019-11-24 DIAGNOSIS — K219 Gastro-esophageal reflux disease without esophagitis: Secondary | ICD-10-CM | POA: Diagnosis not present

## 2019-11-24 DIAGNOSIS — N189 Chronic kidney disease, unspecified: Secondary | ICD-10-CM | POA: Diagnosis not present

## 2019-11-24 DIAGNOSIS — I872 Venous insufficiency (chronic) (peripheral): Secondary | ICD-10-CM | POA: Diagnosis not present

## 2019-11-24 DIAGNOSIS — I130 Hypertensive heart and chronic kidney disease with heart failure and stage 1 through stage 4 chronic kidney disease, or unspecified chronic kidney disease: Secondary | ICD-10-CM | POA: Diagnosis not present

## 2019-11-24 DIAGNOSIS — J4540 Moderate persistent asthma, uncomplicated: Secondary | ICD-10-CM | POA: Diagnosis not present

## 2019-11-24 DIAGNOSIS — G4733 Obstructive sleep apnea (adult) (pediatric): Secondary | ICD-10-CM | POA: Diagnosis not present

## 2019-11-24 DIAGNOSIS — F411 Generalized anxiety disorder: Secondary | ICD-10-CM | POA: Diagnosis not present

## 2019-11-24 DIAGNOSIS — Z7982 Long term (current) use of aspirin: Secondary | ICD-10-CM | POA: Diagnosis not present

## 2019-11-24 DIAGNOSIS — E785 Hyperlipidemia, unspecified: Secondary | ICD-10-CM | POA: Diagnosis not present

## 2019-11-24 DIAGNOSIS — G4700 Insomnia, unspecified: Secondary | ICD-10-CM | POA: Diagnosis not present

## 2019-11-24 DIAGNOSIS — M48062 Spinal stenosis, lumbar region with neurogenic claudication: Secondary | ICD-10-CM | POA: Diagnosis not present

## 2019-11-24 DIAGNOSIS — J9611 Chronic respiratory failure with hypoxia: Secondary | ICD-10-CM | POA: Diagnosis not present

## 2019-11-24 DIAGNOSIS — I739 Peripheral vascular disease, unspecified: Secondary | ICD-10-CM | POA: Diagnosis not present

## 2019-11-24 DIAGNOSIS — F329 Major depressive disorder, single episode, unspecified: Secondary | ICD-10-CM | POA: Diagnosis not present

## 2019-11-24 DIAGNOSIS — I951 Orthostatic hypotension: Secondary | ICD-10-CM | POA: Diagnosis not present

## 2019-11-24 DIAGNOSIS — M810 Age-related osteoporosis without current pathological fracture: Secondary | ICD-10-CM | POA: Diagnosis not present

## 2019-11-24 DIAGNOSIS — Z8701 Personal history of pneumonia (recurrent): Secondary | ICD-10-CM | POA: Diagnosis not present

## 2019-11-27 ENCOUNTER — Other Ambulatory Visit: Payer: Self-pay

## 2019-11-27 ENCOUNTER — Ambulatory Visit (INDEPENDENT_AMBULATORY_CARE_PROVIDER_SITE_OTHER): Payer: Medicare Other | Admitting: Internal Medicine

## 2019-11-27 ENCOUNTER — Telehealth: Payer: Self-pay | Admitting: Internal Medicine

## 2019-11-27 ENCOUNTER — Encounter: Payer: Self-pay | Admitting: Internal Medicine

## 2019-11-27 DIAGNOSIS — I872 Venous insufficiency (chronic) (peripheral): Secondary | ICD-10-CM | POA: Diagnosis not present

## 2019-11-27 DIAGNOSIS — J301 Allergic rhinitis due to pollen: Secondary | ICD-10-CM | POA: Diagnosis not present

## 2019-11-27 DIAGNOSIS — I951 Orthostatic hypotension: Secondary | ICD-10-CM

## 2019-11-27 DIAGNOSIS — M79604 Pain in right leg: Secondary | ICD-10-CM

## 2019-11-27 DIAGNOSIS — F0280 Dementia in other diseases classified elsewhere without behavioral disturbance: Secondary | ICD-10-CM

## 2019-11-27 DIAGNOSIS — M545 Low back pain, unspecified: Secondary | ICD-10-CM

## 2019-11-27 DIAGNOSIS — I10 Essential (primary) hypertension: Secondary | ICD-10-CM | POA: Diagnosis not present

## 2019-11-27 DIAGNOSIS — G309 Alzheimer's disease, unspecified: Secondary | ICD-10-CM

## 2019-11-27 LAB — BASIC METABOLIC PANEL
BUN: 16 mg/dL (ref 6–23)
CO2: 30 mEq/L (ref 19–32)
Calcium: 9.4 mg/dL (ref 8.4–10.5)
Chloride: 104 mEq/L (ref 96–112)
Creatinine, Ser: 0.85 mg/dL (ref 0.40–1.20)
GFR: 63.69 mL/min (ref >60.00)
Glucose, Bld: 138 mg/dL — ABNORMAL HIGH (ref 70–99)
Potassium: 4.1 mEq/L (ref 3.5–5.1)
Sodium: 142 mEq/L (ref 135–145)

## 2019-11-27 LAB — CBC WITH DIFFERENTIAL/PLATELET
Basophils Absolute: 0.1 10*3/uL (ref 0.0–0.1)
Basophils Relative: 1.5 % (ref 0.0–3.0)
Eosinophils Absolute: 0.3 10*3/uL (ref 0.0–0.7)
Eosinophils Relative: 6.2 % — ABNORMAL HIGH (ref 0.0–5.0)
HCT: 41.5 % (ref 36.0–46.0)
Hemoglobin: 13.7 g/dL (ref 12.0–15.0)
Lymphocytes Relative: 25.4 % (ref 12.0–46.0)
Lymphs Abs: 1.2 10*3/uL (ref 0.7–4.0)
MCHC: 33.1 g/dL (ref 30.0–36.0)
MCV: 88.1 fl (ref 78.0–100.0)
Monocytes Absolute: 0.4 10*3/uL (ref 0.1–1.0)
Monocytes Relative: 9.1 % (ref 3.0–12.0)
Neutro Abs: 2.8 10*3/uL (ref 1.4–7.7)
Neutrophils Relative %: 57.8 % (ref 43.0–77.0)
Platelets: 152 10*3/uL (ref 150.0–400.0)
RBC: 4.71 Mil/uL (ref 3.87–5.11)
RDW: 14 % (ref 11.5–15.5)
WBC: 4.9 10*3/uL (ref 4.0–10.5)

## 2019-11-27 LAB — HEPATIC FUNCTION PANEL
ALT: 12 U/L (ref 0–35)
AST: 13 U/L (ref 0–37)
Albumin: 3.9 g/dL (ref 3.5–5.2)
Alkaline Phosphatase: 67 U/L (ref 39–117)
Bilirubin, Direct: 0.1 mg/dL (ref 0.0–0.3)
Total Bilirubin: 0.5 mg/dL (ref 0.2–1.2)
Total Protein: 6.7 g/dL (ref 6.0–8.3)

## 2019-11-27 LAB — TSH: TSH: 2.84 u[IU]/mL (ref 0.35–4.50)

## 2019-11-27 LAB — T4, FREE: Free T4: 1.03 ng/dL (ref 0.60–1.60)

## 2019-11-27 NOTE — Telephone Encounter (Signed)
Requesting orders for PT evaluation  & Nursing 1x9  Kathrine Haddock - [phone] Okay to LVM with orders.

## 2019-11-27 NOTE — Assessment & Plan Note (Signed)
Progressing sx's

## 2019-11-27 NOTE — Telephone Encounter (Signed)
Please advise on below  

## 2019-11-27 NOTE — Progress Notes (Signed)
Subjective:  Patient ID: Sabrina Page, female    DOB: [birth year]  Age: 86 y.o. MRN: [id number]  CC: Follow-up (was also seen by Dr Jenny Reichmann on 6/25 for UTI)   HPI Sabrina Page presents for dementia, LE edema, depression f/u On ABX for UTI now  Unable to collect UA  Outpatient Medications Prior to Visit  Medication Sig Dispense Refill  . ARIPiprazole (ABILIFY) 2 MG tablet TAKE 1 TABLET BY MOUTH EVERY DAY 90 tablet 1  . aspirin 81 MG EC tablet Take 1 tablet (81 mg total) by mouth 2 (two) times daily. 60 tablet 0  . B Complex-C (SUPER B COMPLEX PO) Take 1 tablet by mouth every morning.     . Cholecalciferol 1000 UNITS capsule Take 1,000 Units by mouth every morning.     . fludrocortisone (FLORINEF) 0.1 MG tablet TAKE 1 TABLET BY MOUTH EVERY DAY 90 tablet 3  . FLUoxetine (PROZAC) 40 MG capsule TAKE 1 CAPSULE BY MOUTH EVERY DAY 90 capsule 3  . furosemide (LASIX) 40 MG tablet TAKE 1 TABLET (40 MG TOTAL) BY MOUTH DAILY AS NEEDED FOR EDEMA. 90 tablet 1  . ipratropium-albuterol (DUONEB) 0.5-2.5 (3) MG/3ML SOLN 1 neb every 6 hours if needed 75 mL 12  . memantine (NAMENDA) 10 MG tablet TAKE 1 TABLET BY MOUTH TWICE A DAY 180 tablet 3  . midodrine (PROAMATINE) 10 MG tablet Take 0.5 tablets (5 mg total) by mouth in the morning and at bedtime. 270 tablet 3  . Omega-3 Fatty Acids (FISH OIL) 1000 MG CAPS Take 1 capsule by mouth every morning.     Marland Kitchen omeprazole (PRILOSEC) 20 MG capsule Take 2 capsules (40 mg total) by mouth daily. 180 capsule 3  . potassium chloride (KLOR-CON) 8 MEQ tablet Take 1 tablet (8 mEq total) by mouth every morning. 90 tablet 3  . pyridostigmine (MESTINON) 60 MG tablet TAKE 1 TABLET (60 MG TOTAL) BY MOUTH 3 (THREE) TIMES DAILY. 270 tablet 3  . traMADol (ULTRAM) 50 MG tablet Take 1 tablet (50 mg total) by mouth every 12 (twelve) hours as needed for severe pain. 100 tablet 2   No facility-administered medications prior to visit.    ROS: Review of Systems    Constitutional: Positive for fatigue. Negative for activity change, appetite change, chills and unexpected weight change.  HENT: Negative for congestion, mouth sores and sinus pressure.   Eyes: Negative for visual disturbance.  Respiratory: Negative for cough and chest tightness.   Gastrointestinal: Negative for abdominal pain and nausea.  Genitourinary: Negative for difficulty urinating, frequency and vaginal pain.  Musculoskeletal: Positive for arthralgias, back pain and gait problem.  Skin: Negative for pallor and rash.  Neurological: Positive for weakness. Negative for dizziness, tremors, numbness and headaches.  Psychiatric/Behavioral: Positive for behavioral problems, confusion and decreased concentration. Negative for sleep disturbance.    Objective:  BP 118/78 (BP Location: Left Arm, Patient Position: Sitting, Cuff Size: Large)   Pulse 84   Temp 98.4 F (36.9 C) (Oral)   Ht 5\' 4"  (1.626 m)   SpO2 96%   BMI 40.85 kg/m   BP Readings from Last 3 Encounters:  11/27/19 118/78  11/16/19 130/72  09/14/19 116/73    Wt Readings from Last 3 Encounters:  03/16/19 238 lb (108 kg)  02/27/19 238 lb (108 kg)  01/18/19 242 lb (109.8 kg)    Physical Exam Constitutional:      General: She is not in acute distress.    Appearance: She is well-developed. She is  obese.  HENT:     Head: Normocephalic.     Right Ear: External ear normal.     Left Ear: External ear normal.     Nose: Nose normal.  Eyes:     General:        Right eye: No discharge.        Left eye: No discharge.     Conjunctiva/sclera: Conjunctivae normal.     Pupils: Pupils are equal, round, and reactive to light.  Neck:     Thyroid: No thyromegaly.     Vascular: No JVD.     Trachea: No tracheal deviation.  Cardiovascular:     Rate and Rhythm: Normal rate and regular rhythm.     Heart sounds: Normal heart sounds.  Pulmonary:     Effort: No respiratory distress.     Breath sounds: No stridor. No wheezing.   Abdominal:     General: Bowel sounds are normal. There is no distension.     Palpations: Abdomen is soft. There is no mass.     Tenderness: There is no abdominal tenderness. There is no guarding or rebound.  Musculoskeletal:        General: Tenderness present.     Cervical back: Normal range of motion and neck supple.     Right lower leg: Edema present.     Left lower leg: Edema present.  Lymphadenopathy:     Cervical: No cervical adenopathy.  Skin:    Findings: No erythema or rash.  Neurological:     Mental Status: She is alert. She is disoriented.     Cranial Nerves: No cranial nerve deficit.     Motor: No abnormal muscle tone.     Coordination: Coordination normal.     Gait: Gait abnormal.     Deep Tendon Reflexes: Reflexes normal.    In a w/c Confused  Lab Results  Component Value Date   WBC 7.1 01/18/2019   HGB 14.1 01/18/2019   HCT 42.7 01/18/2019   PLT 243.0 01/18/2019   GLUCOSE 126 (H) 08/28/2019   CHOL 270 (H) 06/12/2014   TRIG 132.0 06/12/2014   HDL 52.40 06/12/2014   LDLDIRECT 159.1 05/16/2012   LDLCALC 191 (H) 06/12/2014   ALT 10 05/29/2019   AST 12 05/29/2019   NA 139 08/28/2019   K 4.5 08/28/2019   CL 102 08/28/2019   CREATININE 0.99 08/28/2019   BUN 13 08/28/2019   CO2 31 08/28/2019   TSH 2.09 01/18/2019   INR 1.07 04/16/2016   HGBA1C 5.4 05/29/2019    DG Abd 1 View  Result Date: 01/18/2019 CLINICAL DATA:  Right flank pain.  Constipation. EXAM: ABDOMEN - 1 VIEW COMPARISON:  None. FINDINGS: The bowel gas pattern is normal. Status post cholecystectomy. Moderate amount of stool seen throughout the colon. No radio-opaque calculi or other significant radiographic abnormality are seen. IMPRESSION: No evidence of bowel obstruction or ileus. Moderate stool burden. No definite nephrolithiasis is noted. Electronically Signed   By: Marijo Conception M.D.   On: 01/18/2019 14:58    Assessment & Plan:    [name], MD

## 2019-11-27 NOTE — Assessment & Plan Note (Signed)
Furosemide prn labs

## 2019-11-27 NOTE — Assessment & Plan Note (Signed)
No recent relapse 

## 2019-11-27 NOTE — Assessment & Plan Note (Signed)
In a w/c Severe

## 2019-11-27 NOTE — Addendum Note (Signed)
Addended by: Cresenciano Lick on: 11/27/2019 08:41 AM   Modules accepted: Orders

## 2019-11-27 NOTE — Assessment & Plan Note (Signed)
Compression socks 

## 2019-11-27 NOTE — Assessment & Plan Note (Signed)
Sneezing Claritin

## 2019-11-28 NOTE — Telephone Encounter (Signed)
Ok Thx 

## 2019-11-28 NOTE — Telephone Encounter (Signed)
Called Sabrina Page and lvm of verbal orders

## 2019-11-29 DIAGNOSIS — N189 Chronic kidney disease, unspecified: Secondary | ICD-10-CM | POA: Diagnosis not present

## 2019-11-29 DIAGNOSIS — G309 Alzheimer's disease, unspecified: Secondary | ICD-10-CM | POA: Diagnosis not present

## 2019-11-29 DIAGNOSIS — I130 Hypertensive heart and chronic kidney disease with heart failure and stage 1 through stage 4 chronic kidney disease, or unspecified chronic kidney disease: Secondary | ICD-10-CM | POA: Diagnosis not present

## 2019-11-29 DIAGNOSIS — J449 Chronic obstructive pulmonary disease, unspecified: Secondary | ICD-10-CM | POA: Diagnosis not present

## 2019-11-29 DIAGNOSIS — I5032 Chronic diastolic (congestive) heart failure: Secondary | ICD-10-CM | POA: Diagnosis not present

## 2019-11-29 DIAGNOSIS — M48062 Spinal stenosis, lumbar region with neurogenic claudication: Secondary | ICD-10-CM | POA: Diagnosis not present

## 2019-12-02 ENCOUNTER — Other Ambulatory Visit: Payer: Self-pay | Admitting: Internal Medicine

## 2019-12-06 DIAGNOSIS — J449 Chronic obstructive pulmonary disease, unspecified: Secondary | ICD-10-CM | POA: Diagnosis not present

## 2019-12-06 DIAGNOSIS — G309 Alzheimer's disease, unspecified: Secondary | ICD-10-CM | POA: Diagnosis not present

## 2019-12-06 DIAGNOSIS — I5032 Chronic diastolic (congestive) heart failure: Secondary | ICD-10-CM | POA: Diagnosis not present

## 2019-12-06 DIAGNOSIS — M48062 Spinal stenosis, lumbar region with neurogenic claudication: Secondary | ICD-10-CM | POA: Diagnosis not present

## 2019-12-06 DIAGNOSIS — I130 Hypertensive heart and chronic kidney disease with heart failure and stage 1 through stage 4 chronic kidney disease, or unspecified chronic kidney disease: Secondary | ICD-10-CM | POA: Diagnosis not present

## 2019-12-06 DIAGNOSIS — N189 Chronic kidney disease, unspecified: Secondary | ICD-10-CM | POA: Diagnosis not present

## 2019-12-11 ENCOUNTER — Telehealth: Payer: Self-pay

## 2019-12-11 NOTE — Telephone Encounter (Signed)
New Message:   Pt's daughter is calling and would like to know when the fax has been sent for the order of the pt's recliner chair. Please advise.

## 2019-12-11 NOTE — Telephone Encounter (Signed)
New message    Medical supply needs an order from Dr.Plotnikov on recliner request - the current recliner is broken   Fax # [phone]

## 2019-12-12 DIAGNOSIS — I951 Orthostatic hypotension: Secondary | ICD-10-CM | POA: Diagnosis not present

## 2019-12-12 DIAGNOSIS — G4733 Obstructive sleep apnea (adult) (pediatric): Secondary | ICD-10-CM

## 2019-12-12 DIAGNOSIS — I5032 Chronic diastolic (congestive) heart failure: Secondary | ICD-10-CM | POA: Diagnosis not present

## 2019-12-12 DIAGNOSIS — M48062 Spinal stenosis, lumbar region with neurogenic claudication: Secondary | ICD-10-CM | POA: Diagnosis not present

## 2019-12-12 DIAGNOSIS — G309 Alzheimer's disease, unspecified: Secondary | ICD-10-CM | POA: Diagnosis not present

## 2019-12-12 DIAGNOSIS — Z96652 Presence of left artificial knee joint: Secondary | ICD-10-CM

## 2019-12-12 DIAGNOSIS — J449 Chronic obstructive pulmonary disease, unspecified: Secondary | ICD-10-CM | POA: Diagnosis not present

## 2019-12-12 DIAGNOSIS — F411 Generalized anxiety disorder: Secondary | ICD-10-CM

## 2019-12-12 DIAGNOSIS — J4540 Moderate persistent asthma, uncomplicated: Secondary | ICD-10-CM | POA: Diagnosis not present

## 2019-12-12 DIAGNOSIS — Z6841 Body Mass Index (BMI) 40.0 and over, adult: Secondary | ICD-10-CM

## 2019-12-12 DIAGNOSIS — Z9181 History of falling: Secondary | ICD-10-CM

## 2019-12-12 DIAGNOSIS — M1990 Unspecified osteoarthritis, unspecified site: Secondary | ICD-10-CM | POA: Diagnosis not present

## 2019-12-12 DIAGNOSIS — K5730 Diverticulosis of large intestine without perforation or abscess without bleeding: Secondary | ICD-10-CM

## 2019-12-12 DIAGNOSIS — N189 Chronic kidney disease, unspecified: Secondary | ICD-10-CM | POA: Diagnosis not present

## 2019-12-12 DIAGNOSIS — J9611 Chronic respiratory failure with hypoxia: Secondary | ICD-10-CM | POA: Diagnosis not present

## 2019-12-12 DIAGNOSIS — M810 Age-related osteoporosis without current pathological fracture: Secondary | ICD-10-CM

## 2019-12-12 DIAGNOSIS — I872 Venous insufficiency (chronic) (peripheral): Secondary | ICD-10-CM

## 2019-12-12 DIAGNOSIS — Z87891 Personal history of nicotine dependence: Secondary | ICD-10-CM

## 2019-12-12 DIAGNOSIS — F329 Major depressive disorder, single episode, unspecified: Secondary | ICD-10-CM

## 2019-12-12 DIAGNOSIS — Z87440 Personal history of urinary (tract) infections: Secondary | ICD-10-CM

## 2019-12-12 DIAGNOSIS — I739 Peripheral vascular disease, unspecified: Secondary | ICD-10-CM | POA: Diagnosis not present

## 2019-12-12 DIAGNOSIS — G4700 Insomnia, unspecified: Secondary | ICD-10-CM

## 2019-12-12 DIAGNOSIS — F0280 Dementia in other diseases classified elsewhere without behavioral disturbance: Secondary | ICD-10-CM | POA: Diagnosis not present

## 2019-12-12 DIAGNOSIS — E785 Hyperlipidemia, unspecified: Secondary | ICD-10-CM

## 2019-12-12 DIAGNOSIS — K219 Gastro-esophageal reflux disease without esophagitis: Secondary | ICD-10-CM

## 2019-12-12 DIAGNOSIS — Z7982 Long term (current) use of aspirin: Secondary | ICD-10-CM

## 2019-12-12 DIAGNOSIS — Z8701 Personal history of pneumonia (recurrent): Secondary | ICD-10-CM

## 2019-12-12 DIAGNOSIS — I130 Hypertensive heart and chronic kidney disease with heart failure and stage 1 through stage 4 chronic kidney disease, or unspecified chronic kidney disease: Secondary | ICD-10-CM | POA: Diagnosis not present

## 2019-12-12 NOTE — Telephone Encounter (Signed)
New message:   Pt's daughter is calling and wants to know when the form will be faxed. She is now saying that she is coming up here to pick up the form. Please advise.

## 2019-12-12 NOTE — Telephone Encounter (Signed)
Ok Thx 

## 2019-12-13 DIAGNOSIS — G309 Alzheimer's disease, unspecified: Secondary | ICD-10-CM | POA: Diagnosis not present

## 2019-12-13 DIAGNOSIS — I130 Hypertensive heart and chronic kidney disease with heart failure and stage 1 through stage 4 chronic kidney disease, or unspecified chronic kidney disease: Secondary | ICD-10-CM | POA: Diagnosis not present

## 2019-12-13 DIAGNOSIS — M48062 Spinal stenosis, lumbar region with neurogenic claudication: Secondary | ICD-10-CM | POA: Diagnosis not present

## 2019-12-13 DIAGNOSIS — I5032 Chronic diastolic (congestive) heart failure: Secondary | ICD-10-CM | POA: Diagnosis not present

## 2019-12-13 DIAGNOSIS — N189 Chronic kidney disease, unspecified: Secondary | ICD-10-CM | POA: Diagnosis not present

## 2019-12-13 DIAGNOSIS — J449 Chronic obstructive pulmonary disease, unspecified: Secondary | ICD-10-CM | POA: Diagnosis not present

## 2019-12-13 NOTE — Telephone Encounter (Signed)
Patient's daughter is calling again to follow up.

## 2019-12-14 ENCOUNTER — Telehealth: Payer: Self-pay | Admitting: Neurology

## 2019-12-14 ENCOUNTER — Other Ambulatory Visit: Payer: Self-pay | Admitting: Family

## 2019-12-14 DIAGNOSIS — F0280 Dementia in other diseases classified elsewhere without behavioral disturbance: Secondary | ICD-10-CM

## 2019-12-14 DIAGNOSIS — G309 Alzheimer's disease, unspecified: Secondary | ICD-10-CM

## 2019-12-14 DIAGNOSIS — I951 Orthostatic hypotension: Secondary | ICD-10-CM

## 2019-12-14 DIAGNOSIS — J9611 Chronic respiratory failure with hypoxia: Secondary | ICD-10-CM

## 2019-12-14 DIAGNOSIS — G903 Multi-system degeneration of the autonomic nervous system: Secondary | ICD-10-CM

## 2019-12-14 NOTE — Telephone Encounter (Signed)
Patient's daughter called in and is needing to purchase a lift chair for her mother and they need a prescription faxed to [phone].

## 2019-12-14 NOTE — Telephone Encounter (Signed)
Called pt's daughter and informed her that Sabrina Page placed the order for pt's recliner and faxed to [employer] [facility] [employer] at [phone]

## 2019-12-14 NOTE — Telephone Encounter (Signed)
Called daughter who stated she got lift chair script from PCP already.

## 2019-12-20 ENCOUNTER — Telehealth: Payer: Self-pay | Admitting: Internal Medicine

## 2019-12-20 DIAGNOSIS — I5032 Chronic diastolic (congestive) heart failure: Secondary | ICD-10-CM | POA: Diagnosis not present

## 2019-12-20 DIAGNOSIS — G309 Alzheimer's disease, unspecified: Secondary | ICD-10-CM | POA: Diagnosis not present

## 2019-12-20 DIAGNOSIS — J449 Chronic obstructive pulmonary disease, unspecified: Secondary | ICD-10-CM | POA: Diagnosis not present

## 2019-12-20 DIAGNOSIS — N189 Chronic kidney disease, unspecified: Secondary | ICD-10-CM | POA: Diagnosis not present

## 2019-12-20 DIAGNOSIS — I130 Hypertensive heart and chronic kidney disease with heart failure and stage 1 through stage 4 chronic kidney disease, or unspecified chronic kidney disease: Secondary | ICD-10-CM | POA: Diagnosis not present

## 2019-12-20 DIAGNOSIS — M48062 Spinal stenosis, lumbar region with neurogenic claudication: Secondary | ICD-10-CM | POA: Diagnosis not present

## 2019-12-20 NOTE — Telephone Encounter (Signed)
Sabrina Page with [facility] called and was requesting  verbal physical therapy orders.

## 2019-12-21 NOTE — Telephone Encounter (Signed)
Pt aware of verbal orders

## 2019-12-21 NOTE — Telephone Encounter (Signed)
Okay.  Thanks.

## 2019-12-24 DIAGNOSIS — N189 Chronic kidney disease, unspecified: Secondary | ICD-10-CM | POA: Diagnosis not present

## 2019-12-24 DIAGNOSIS — I5032 Chronic diastolic (congestive) heart failure: Secondary | ICD-10-CM | POA: Diagnosis not present

## 2019-12-24 DIAGNOSIS — G4733 Obstructive sleep apnea (adult) (pediatric): Secondary | ICD-10-CM | POA: Diagnosis not present

## 2019-12-24 DIAGNOSIS — Z6841 Body Mass Index (BMI) 40.0 and over, adult: Secondary | ICD-10-CM | POA: Diagnosis not present

## 2019-12-24 DIAGNOSIS — F0280 Dementia in other diseases classified elsewhere without behavioral disturbance: Secondary | ICD-10-CM | POA: Diagnosis not present

## 2019-12-24 DIAGNOSIS — F329 Major depressive disorder, single episode, unspecified: Secondary | ICD-10-CM | POA: Diagnosis not present

## 2019-12-24 DIAGNOSIS — M810 Age-related osteoporosis without current pathological fracture: Secondary | ICD-10-CM | POA: Diagnosis not present

## 2019-12-24 DIAGNOSIS — E785 Hyperlipidemia, unspecified: Secondary | ICD-10-CM | POA: Diagnosis not present

## 2019-12-24 DIAGNOSIS — I739 Peripheral vascular disease, unspecified: Secondary | ICD-10-CM | POA: Diagnosis not present

## 2019-12-24 DIAGNOSIS — F411 Generalized anxiety disorder: Secondary | ICD-10-CM | POA: Diagnosis not present

## 2019-12-24 DIAGNOSIS — K219 Gastro-esophageal reflux disease without esophagitis: Secondary | ICD-10-CM | POA: Diagnosis not present

## 2019-12-24 DIAGNOSIS — J9611 Chronic respiratory failure with hypoxia: Secondary | ICD-10-CM | POA: Diagnosis not present

## 2019-12-24 DIAGNOSIS — M48062 Spinal stenosis, lumbar region with neurogenic claudication: Secondary | ICD-10-CM | POA: Diagnosis not present

## 2019-12-24 DIAGNOSIS — M1990 Unspecified osteoarthritis, unspecified site: Secondary | ICD-10-CM | POA: Diagnosis not present

## 2019-12-24 DIAGNOSIS — J4540 Moderate persistent asthma, uncomplicated: Secondary | ICD-10-CM | POA: Diagnosis not present

## 2019-12-24 DIAGNOSIS — G4700 Insomnia, unspecified: Secondary | ICD-10-CM | POA: Diagnosis not present

## 2019-12-24 DIAGNOSIS — Z8701 Personal history of pneumonia (recurrent): Secondary | ICD-10-CM | POA: Diagnosis not present

## 2019-12-24 DIAGNOSIS — I951 Orthostatic hypotension: Secondary | ICD-10-CM | POA: Diagnosis not present

## 2019-12-24 DIAGNOSIS — I872 Venous insufficiency (chronic) (peripheral): Secondary | ICD-10-CM | POA: Diagnosis not present

## 2019-12-24 DIAGNOSIS — K5730 Diverticulosis of large intestine without perforation or abscess without bleeding: Secondary | ICD-10-CM | POA: Diagnosis not present

## 2019-12-24 DIAGNOSIS — I130 Hypertensive heart and chronic kidney disease with heart failure and stage 1 through stage 4 chronic kidney disease, or unspecified chronic kidney disease: Secondary | ICD-10-CM | POA: Diagnosis not present

## 2019-12-24 DIAGNOSIS — Z7982 Long term (current) use of aspirin: Secondary | ICD-10-CM | POA: Diagnosis not present

## 2019-12-24 DIAGNOSIS — G309 Alzheimer's disease, unspecified: Secondary | ICD-10-CM | POA: Diagnosis not present

## 2019-12-24 DIAGNOSIS — J449 Chronic obstructive pulmonary disease, unspecified: Secondary | ICD-10-CM | POA: Diagnosis not present

## 2019-12-27 DIAGNOSIS — G309 Alzheimer's disease, unspecified: Secondary | ICD-10-CM | POA: Diagnosis not present

## 2019-12-27 DIAGNOSIS — J449 Chronic obstructive pulmonary disease, unspecified: Secondary | ICD-10-CM | POA: Diagnosis not present

## 2019-12-27 DIAGNOSIS — N189 Chronic kidney disease, unspecified: Secondary | ICD-10-CM | POA: Diagnosis not present

## 2019-12-27 DIAGNOSIS — M48062 Spinal stenosis, lumbar region with neurogenic claudication: Secondary | ICD-10-CM | POA: Diagnosis not present

## 2019-12-27 DIAGNOSIS — I5032 Chronic diastolic (congestive) heart failure: Secondary | ICD-10-CM | POA: Diagnosis not present

## 2019-12-27 DIAGNOSIS — I130 Hypertensive heart and chronic kidney disease with heart failure and stage 1 through stage 4 chronic kidney disease, or unspecified chronic kidney disease: Secondary | ICD-10-CM | POA: Diagnosis not present

## 2019-12-28 DIAGNOSIS — I130 Hypertensive heart and chronic kidney disease with heart failure and stage 1 through stage 4 chronic kidney disease, or unspecified chronic kidney disease: Secondary | ICD-10-CM | POA: Diagnosis not present

## 2019-12-28 DIAGNOSIS — G309 Alzheimer's disease, unspecified: Secondary | ICD-10-CM | POA: Diagnosis not present

## 2019-12-28 DIAGNOSIS — I5032 Chronic diastolic (congestive) heart failure: Secondary | ICD-10-CM | POA: Diagnosis not present

## 2019-12-28 DIAGNOSIS — N189 Chronic kidney disease, unspecified: Secondary | ICD-10-CM | POA: Diagnosis not present

## 2019-12-28 DIAGNOSIS — J449 Chronic obstructive pulmonary disease, unspecified: Secondary | ICD-10-CM | POA: Diagnosis not present

## 2019-12-28 DIAGNOSIS — M48062 Spinal stenosis, lumbar region with neurogenic claudication: Secondary | ICD-10-CM | POA: Diagnosis not present

## 2020-01-01 DIAGNOSIS — M48062 Spinal stenosis, lumbar region with neurogenic claudication: Secondary | ICD-10-CM | POA: Diagnosis not present

## 2020-01-01 DIAGNOSIS — I130 Hypertensive heart and chronic kidney disease with heart failure and stage 1 through stage 4 chronic kidney disease, or unspecified chronic kidney disease: Secondary | ICD-10-CM | POA: Diagnosis not present

## 2020-01-01 DIAGNOSIS — J449 Chronic obstructive pulmonary disease, unspecified: Secondary | ICD-10-CM | POA: Diagnosis not present

## 2020-01-01 DIAGNOSIS — G309 Alzheimer's disease, unspecified: Secondary | ICD-10-CM | POA: Diagnosis not present

## 2020-01-01 DIAGNOSIS — N189 Chronic kidney disease, unspecified: Secondary | ICD-10-CM | POA: Diagnosis not present

## 2020-01-01 DIAGNOSIS — I5032 Chronic diastolic (congestive) heart failure: Secondary | ICD-10-CM | POA: Diagnosis not present

## 2020-01-02 ENCOUNTER — Inpatient Hospital Stay
Admission: RE | Admit: 2020-01-02 | Discharge: 2020-01-02 | Disposition: A | Discharge status: Home / Self Care | Payer: Medicare Other | Source: Ambulatory Visit

## 2020-01-02 ENCOUNTER — Telehealth: Payer: Medicare Other | Admitting: Nurse Practitioner

## 2020-01-02 DIAGNOSIS — N3000 Acute cystitis without hematuria: Secondary | ICD-10-CM | POA: Diagnosis not present

## 2020-01-02 MED ORDER — CEPHALEXIN 500 MG PO CAPS: 500.0000 mg | ORAL_CAPSULE | Freq: Two times a day (BID) | ORAL | 0 refills | Status: DC

## 2020-01-02 NOTE — Progress Notes (Signed)

## 2020-01-03 DIAGNOSIS — J449 Chronic obstructive pulmonary disease, unspecified: Secondary | ICD-10-CM | POA: Diagnosis not present

## 2020-01-03 DIAGNOSIS — I5032 Chronic diastolic (congestive) heart failure: Secondary | ICD-10-CM | POA: Diagnosis not present

## 2020-01-03 DIAGNOSIS — N189 Chronic kidney disease, unspecified: Secondary | ICD-10-CM | POA: Diagnosis not present

## 2020-01-03 DIAGNOSIS — G309 Alzheimer's disease, unspecified: Secondary | ICD-10-CM | POA: Diagnosis not present

## 2020-01-03 DIAGNOSIS — I130 Hypertensive heart and chronic kidney disease with heart failure and stage 1 through stage 4 chronic kidney disease, or unspecified chronic kidney disease: Secondary | ICD-10-CM | POA: Diagnosis not present

## 2020-01-03 DIAGNOSIS — M48062 Spinal stenosis, lumbar region with neurogenic claudication: Secondary | ICD-10-CM | POA: Diagnosis not present

## 2020-01-04 ENCOUNTER — Ambulatory Visit: Payer: Medicare Other | Admitting: Internal Medicine

## 2020-01-04 DIAGNOSIS — N189 Chronic kidney disease, unspecified: Secondary | ICD-10-CM | POA: Diagnosis not present

## 2020-01-04 DIAGNOSIS — I5032 Chronic diastolic (congestive) heart failure: Secondary | ICD-10-CM | POA: Diagnosis not present

## 2020-01-04 DIAGNOSIS — J449 Chronic obstructive pulmonary disease, unspecified: Secondary | ICD-10-CM | POA: Diagnosis not present

## 2020-01-04 DIAGNOSIS — G309 Alzheimer's disease, unspecified: Secondary | ICD-10-CM | POA: Diagnosis not present

## 2020-01-04 DIAGNOSIS — M48062 Spinal stenosis, lumbar region with neurogenic claudication: Secondary | ICD-10-CM | POA: Diagnosis not present

## 2020-01-04 DIAGNOSIS — I130 Hypertensive heart and chronic kidney disease with heart failure and stage 1 through stage 4 chronic kidney disease, or unspecified chronic kidney disease: Secondary | ICD-10-CM | POA: Diagnosis not present

## 2020-01-07 DIAGNOSIS — I130 Hypertensive heart and chronic kidney disease with heart failure and stage 1 through stage 4 chronic kidney disease, or unspecified chronic kidney disease: Secondary | ICD-10-CM | POA: Diagnosis not present

## 2020-01-07 DIAGNOSIS — J449 Chronic obstructive pulmonary disease, unspecified: Secondary | ICD-10-CM | POA: Diagnosis not present

## 2020-01-07 DIAGNOSIS — N189 Chronic kidney disease, unspecified: Secondary | ICD-10-CM | POA: Diagnosis not present

## 2020-01-07 DIAGNOSIS — I5032 Chronic diastolic (congestive) heart failure: Secondary | ICD-10-CM | POA: Diagnosis not present

## 2020-01-07 DIAGNOSIS — G309 Alzheimer's disease, unspecified: Secondary | ICD-10-CM | POA: Diagnosis not present

## 2020-01-07 DIAGNOSIS — M48062 Spinal stenosis, lumbar region with neurogenic claudication: Secondary | ICD-10-CM | POA: Diagnosis not present

## 2020-01-10 DIAGNOSIS — M48062 Spinal stenosis, lumbar region with neurogenic claudication: Secondary | ICD-10-CM | POA: Diagnosis not present

## 2020-01-10 DIAGNOSIS — J449 Chronic obstructive pulmonary disease, unspecified: Secondary | ICD-10-CM | POA: Diagnosis not present

## 2020-01-10 DIAGNOSIS — N189 Chronic kidney disease, unspecified: Secondary | ICD-10-CM | POA: Diagnosis not present

## 2020-01-10 DIAGNOSIS — I130 Hypertensive heart and chronic kidney disease with heart failure and stage 1 through stage 4 chronic kidney disease, or unspecified chronic kidney disease: Secondary | ICD-10-CM | POA: Diagnosis not present

## 2020-01-10 DIAGNOSIS — G309 Alzheimer's disease, unspecified: Secondary | ICD-10-CM | POA: Diagnosis not present

## 2020-01-10 DIAGNOSIS — I5032 Chronic diastolic (congestive) heart failure: Secondary | ICD-10-CM | POA: Diagnosis not present

## 2020-01-11 DIAGNOSIS — I130 Hypertensive heart and chronic kidney disease with heart failure and stage 1 through stage 4 chronic kidney disease, or unspecified chronic kidney disease: Secondary | ICD-10-CM | POA: Diagnosis not present

## 2020-01-11 DIAGNOSIS — I5032 Chronic diastolic (congestive) heart failure: Secondary | ICD-10-CM | POA: Diagnosis not present

## 2020-01-11 DIAGNOSIS — G309 Alzheimer's disease, unspecified: Secondary | ICD-10-CM | POA: Diagnosis not present

## 2020-01-11 DIAGNOSIS — N189 Chronic kidney disease, unspecified: Secondary | ICD-10-CM | POA: Diagnosis not present

## 2020-01-11 DIAGNOSIS — M48062 Spinal stenosis, lumbar region with neurogenic claudication: Secondary | ICD-10-CM | POA: Diagnosis not present

## 2020-01-11 DIAGNOSIS — J449 Chronic obstructive pulmonary disease, unspecified: Secondary | ICD-10-CM | POA: Diagnosis not present

## 2020-01-14 DIAGNOSIS — I130 Hypertensive heart and chronic kidney disease with heart failure and stage 1 through stage 4 chronic kidney disease, or unspecified chronic kidney disease: Secondary | ICD-10-CM | POA: Diagnosis not present

## 2020-01-14 DIAGNOSIS — I5032 Chronic diastolic (congestive) heart failure: Secondary | ICD-10-CM | POA: Diagnosis not present

## 2020-01-14 DIAGNOSIS — J449 Chronic obstructive pulmonary disease, unspecified: Secondary | ICD-10-CM | POA: Diagnosis not present

## 2020-01-14 DIAGNOSIS — N189 Chronic kidney disease, unspecified: Secondary | ICD-10-CM | POA: Diagnosis not present

## 2020-01-14 DIAGNOSIS — G309 Alzheimer's disease, unspecified: Secondary | ICD-10-CM | POA: Diagnosis not present

## 2020-01-14 DIAGNOSIS — M48062 Spinal stenosis, lumbar region with neurogenic claudication: Secondary | ICD-10-CM | POA: Diagnosis not present

## 2020-01-17 ENCOUNTER — Telehealth: Payer: Self-pay

## 2020-01-17 DIAGNOSIS — J449 Chronic obstructive pulmonary disease, unspecified: Secondary | ICD-10-CM | POA: Diagnosis not present

## 2020-01-17 DIAGNOSIS — I130 Hypertensive heart and chronic kidney disease with heart failure and stage 1 through stage 4 chronic kidney disease, or unspecified chronic kidney disease: Secondary | ICD-10-CM | POA: Diagnosis not present

## 2020-01-17 DIAGNOSIS — M48062 Spinal stenosis, lumbar region with neurogenic claudication: Secondary | ICD-10-CM | POA: Diagnosis not present

## 2020-01-17 DIAGNOSIS — I5032 Chronic diastolic (congestive) heart failure: Secondary | ICD-10-CM | POA: Diagnosis not present

## 2020-01-17 DIAGNOSIS — N189 Chronic kidney disease, unspecified: Secondary | ICD-10-CM | POA: Diagnosis not present

## 2020-01-17 DIAGNOSIS — G309 Alzheimer's disease, unspecified: Secondary | ICD-10-CM | POA: Diagnosis not present

## 2020-01-17 NOTE — Telephone Encounter (Signed)
New message   The daughter is asking for In/out cath reason foul-smelling urine.   Please advises   Home health is wrapping up discharging the patient from their services need an answer within 45 min.

## 2020-01-18 DIAGNOSIS — M48062 Spinal stenosis, lumbar region with neurogenic claudication: Secondary | ICD-10-CM | POA: Diagnosis not present

## 2020-01-18 DIAGNOSIS — I5032 Chronic diastolic (congestive) heart failure: Secondary | ICD-10-CM | POA: Diagnosis not present

## 2020-01-18 DIAGNOSIS — J449 Chronic obstructive pulmonary disease, unspecified: Secondary | ICD-10-CM | POA: Diagnosis not present

## 2020-01-18 DIAGNOSIS — N189 Chronic kidney disease, unspecified: Secondary | ICD-10-CM | POA: Diagnosis not present

## 2020-01-18 DIAGNOSIS — I130 Hypertensive heart and chronic kidney disease with heart failure and stage 1 through stage 4 chronic kidney disease, or unspecified chronic kidney disease: Secondary | ICD-10-CM | POA: Diagnosis not present

## 2020-01-18 DIAGNOSIS — G309 Alzheimer's disease, unspecified: Secondary | ICD-10-CM | POA: Diagnosis not present

## 2020-01-21 NOTE — Telephone Encounter (Signed)
Please advise on catheter

## 2020-01-22 NOTE — Telephone Encounter (Signed)
Left Sabrina Page a vm informing her of orders fo UA and Cx

## 2020-01-22 NOTE — Telephone Encounter (Signed)
Pls get UA and Cx Thx

## 2020-01-23 NOTE — Telephone Encounter (Signed)
Spoke with [facility] that has left a message for Eletha to give me a call back

## 2020-01-29 NOTE — Telephone Encounter (Signed)
Sabrina Page states that pt is no longer in her care and pt's daughter states there is no way for her to come in office to do a UA and Cx

## 2020-01-29 NOTE — Telephone Encounter (Signed)
Left Eletha a vm informing her or UA and Cx and for her to call me back

## 2020-02-07 DIAGNOSIS — N390 Urinary tract infection, site not specified: Secondary | ICD-10-CM | POA: Diagnosis not present

## 2020-02-16 ENCOUNTER — Other Ambulatory Visit: Payer: Self-pay | Admitting: Internal Medicine

## 2020-02-27 ENCOUNTER — Ambulatory Visit (INDEPENDENT_AMBULATORY_CARE_PROVIDER_SITE_OTHER): Payer: Medicare Other | Admitting: Internal Medicine

## 2020-02-27 ENCOUNTER — Other Ambulatory Visit: Payer: Self-pay

## 2020-02-27 ENCOUNTER — Encounter: Payer: Self-pay | Admitting: Internal Medicine

## 2020-02-27 VITALS — BP 120/80 | HR 93 | Temp 98.3°F | Ht 64.0 in

## 2020-02-27 DIAGNOSIS — M79604 Pain in right leg: Secondary | ICD-10-CM | POA: Diagnosis not present

## 2020-02-27 DIAGNOSIS — M79605 Pain in left leg: Secondary | ICD-10-CM

## 2020-02-27 DIAGNOSIS — F411 Generalized anxiety disorder: Secondary | ICD-10-CM | POA: Diagnosis not present

## 2020-02-27 DIAGNOSIS — N390 Urinary tract infection, site not specified: Secondary | ICD-10-CM | POA: Diagnosis not present

## 2020-02-27 DIAGNOSIS — I10 Essential (primary) hypertension: Secondary | ICD-10-CM | POA: Diagnosis not present

## 2020-02-27 DIAGNOSIS — M545 Low back pain, unspecified: Secondary | ICD-10-CM

## 2020-02-27 DIAGNOSIS — G309 Alzheimer's disease, unspecified: Secondary | ICD-10-CM

## 2020-02-27 DIAGNOSIS — R296 Repeated falls: Secondary | ICD-10-CM

## 2020-02-27 DIAGNOSIS — F0280 Dementia in other diseases classified elsewhere without behavioral disturbance: Secondary | ICD-10-CM

## 2020-02-27 DIAGNOSIS — R7309 Other abnormal glucose: Secondary | ICD-10-CM

## 2020-02-27 LAB — COMPREHENSIVE METABOLIC PANEL
ALT: 10 U/L (ref 0–35)
AST: 11 U/L (ref 0–37)
Albumin: 3.9 g/dL (ref 3.5–5.2)
Alkaline Phosphatase: 69 U/L (ref 39–117)
BUN: 15 mg/dL (ref 6–23)
CO2: 32 mEq/L (ref 19–32)
Calcium: 9.5 mg/dL (ref 8.4–10.5)
Chloride: 101 mEq/L (ref 96–112)
Creatinine, Ser: 0.97 mg/dL (ref 0.40–1.20)
GFR: 53.48 mL/min — ABNORMAL LOW (ref >60.00)
Glucose, Bld: 138 mg/dL — ABNORMAL HIGH (ref 70–99)
Potassium: 4 mEq/L (ref 3.5–5.1)
Sodium: 140 mEq/L (ref 135–145)
Total Bilirubin: 0.5 mg/dL (ref 0.2–1.2)
Total Protein: 6.9 g/dL (ref 6.0–8.3)

## 2020-02-27 LAB — HEMOGLOBIN A1C: Hgb A1c MFr Bld: 5.7 % (ref 4.6–6.5)

## 2020-02-27 LAB — URINALYSIS
Bilirubin Urine: NEGATIVE
Hgb urine dipstick: NEGATIVE
Ketones, ur: NEGATIVE
Leukocytes,Ua: NEGATIVE
Nitrite: NEGATIVE
Specific Gravity, Urine: 1.01 (ref 1.000–1.030)
Total Protein, Urine: NEGATIVE
Urine Glucose: NEGATIVE
Urobilinogen, UA: 0.2 (ref 0.0–1.0)
pH: 6 (ref 5.0–8.0)

## 2020-02-27 LAB — TSH: TSH: 2.61 u[IU]/mL (ref 0.35–4.50)

## 2020-02-27 MED ORDER — CIPROFLOXACIN HCL 500 MG PO TABS
500.0000 mg | ORAL_TABLET | Freq: Two times a day (BID) | ORAL | 0 refills | Status: DC
Start: 2020-02-27 — End: 2020-04-28

## 2020-02-27 NOTE — Progress Notes (Signed)
Subjective:  Patient ID: Sabrina Page, female    DOB: [birth year]  Age: 86 y.o. MRN: [id number]  CC: Follow-up   HPI Shaana Cary presents for UTIs, depression, dementia, LBP f/u.  The patient's ability to walk is deteriorating.  Her dementia is getting worse.  Outpatient Medications Prior to Visit  Medication Sig Dispense Refill  . ARIPiprazole (ABILIFY) 2 MG tablet TAKE 1 TABLET BY MOUTH EVERY DAY 90 tablet 1  . aspirin 81 MG EC tablet Take 1 tablet (81 mg total) by mouth 2 (two) times daily. 60 tablet 0  . B Complex-C (SUPER B COMPLEX PO) Take 1 tablet by mouth every morning.     . cephALEXin (KEFLEX) 500 MG capsule Take 1 capsule (500 mg total) by mouth 2 (two) times daily. 14 capsule 0  . Cholecalciferol 1000 UNITS capsule Take 1,000 Units by mouth every morning.     . fludrocortisone (FLORINEF) 0.1 MG tablet TAKE 1 TABLET BY MOUTH EVERY DAY 90 tablet 3  . FLUoxetine (PROZAC) 40 MG capsule TAKE 1 CAPSULE BY MOUTH EVERY DAY 90 capsule 3  . furosemide (LASIX) 40 MG tablet TAKE 1 TABLET (40 MG TOTAL) BY MOUTH DAILY AS NEEDED FOR EDEMA. 90 tablet 3  . ipratropium-albuterol (DUONEB) 0.5-2.5 (3) MG/3ML SOLN 1 neb every 6 hours if needed 75 mL 12  . memantine (NAMENDA) 10 MG tablet TAKE 1 TABLET BY MOUTH TWICE A DAY 180 tablet 3  . midodrine (PROAMATINE) 10 MG tablet Take 0.5 tablets (5 mg total) by mouth in the morning and at bedtime. 270 tablet 3  . Omega-3 Fatty Acids (FISH OIL) 1000 MG CAPS Take 1 capsule by mouth every morning.     Marland Kitchen omeprazole (PRILOSEC) 20 MG capsule Take 2 capsules (40 mg total) by mouth daily. 180 capsule 3  . potassium chloride (KLOR-CON) 8 MEQ tablet Take 1 tablet (8 mEq total) by mouth every morning. 90 tablet 3  . pyridostigmine (MESTINON) 60 MG tablet TAKE 1 TABLET (60 MG TOTAL) BY MOUTH 3 (THREE) TIMES DAILY. 270 tablet 3  . traMADol (ULTRAM) 50 MG tablet Take 1 tablet (50 mg total) by mouth every 12 (twelve) hours as needed for severe pain.  100 tablet 2   No facility-administered medications prior to visit.    ROS: Review of Systems  Constitutional: Positive for fatigue. Negative for activity change, appetite change, chills and unexpected weight change.  HENT: Negative for congestion, mouth sores and sinus pressure.   Eyes: Negative for visual disturbance.  Respiratory: Negative for cough and chest tightness.   Gastrointestinal: Negative for abdominal pain and nausea.  Genitourinary: Negative for difficulty urinating, frequency and vaginal pain.  Musculoskeletal: Positive for arthralgias, back pain and gait problem.  Skin: Negative for pallor and rash.  Neurological: Negative for dizziness, tremors, weakness, numbness and headaches.  Psychiatric/Behavioral: Positive for confusion and decreased concentration. Negative for agitation, sleep disturbance and suicidal ideas. The patient is nervous/anxious.     Objective:  BP 120/80 (BP Location: Left Arm, Patient Position: Sitting, Cuff Size: Large)   Pulse 93   Temp 98.3 F (36.8 C) (Oral)   Ht 5\' 4"  (1.626 m)   SpO2 98%   BMI 40.85 kg/m   BP Readings from Last 3 Encounters:  02/27/20 120/80  11/27/19 118/78  11/16/19 130/72    Wt Readings from Last 3 Encounters:  03/16/19 238 lb (108 kg)  02/27/19 238 lb (108 kg)  01/18/19 242 lb (109.8 kg)    Physical Exam Constitutional:  General: She is not in acute distress.    Appearance: She is well-developed. She is obese.  HENT:     Head: Normocephalic.     Right Ear: External ear normal.     Left Ear: External ear normal.     Nose: Nose normal.  Eyes:     General:        Right eye: No discharge.        Left eye: No discharge.     Conjunctiva/sclera: Conjunctivae normal.     Pupils: Pupils are equal, round, and reactive to light.  Neck:     Thyroid: No thyromegaly.     Vascular: No JVD.     Trachea: No tracheal deviation.  Cardiovascular:     Rate and Rhythm: Normal rate and regular rhythm.     Heart  sounds: Normal heart sounds.  Pulmonary:     Effort: No respiratory distress.     Breath sounds: No stridor. No wheezing.  Abdominal:     General: Bowel sounds are normal. There is no distension.     Palpations: Abdomen is soft. There is no mass.     Tenderness: There is no abdominal tenderness. There is no guarding or rebound.  Musculoskeletal:        General: Tenderness present.     Cervical back: Normal range of motion and neck supple.  Lymphadenopathy:     Cervical: No cervical adenopathy.  Skin:    Findings: No erythema or rash.  Neurological:     Mental Status: She is disoriented.     Cranial Nerves: No cranial nerve deficit.     Motor: Weakness present. No abnormal muscle tone.     Coordination: Coordination abnormal.     Gait: Gait abnormal.     Deep Tendon Reflexes: Reflexes normal.  Psychiatric:        Behavior: Behavior normal.   in a w/c.  Pleasantly confused    A total time of >45 minutes was spent preparing to see the patient, reviewing tests, x-rays, operative reports and outside records.  Also, obtaining history and performing comprehensive physical exam.  Additionally, counseling the patient regarding the above listed issues.   Finally, documenting clinical information in the health records, coordination of care, educating the patient. It is a complex case.  Home PT was ordered.  We discussed care at home the availability. [name] is having tremendous difficulty is bringing her mom to the office.   Lab Results  Component Value Date   WBC 4.9 11/27/2019   HGB 13.7 11/27/2019   HCT 41.5 11/27/2019   PLT 152.0 11/27/2019   GLUCOSE 138 (H) 11/27/2019   CHOL 270 (H) 06/12/2014   TRIG 132.0 06/12/2014   HDL 52.40 06/12/2014   LDLDIRECT 159.1 05/16/2012   LDLCALC 191 (H) 06/12/2014   ALT 12 11/27/2019   AST 13 11/27/2019   NA 142 11/27/2019   K 4.1 11/27/2019   CL 104 11/27/2019   CREATININE 0.85 11/27/2019   BUN 16 11/27/2019   CO2 30 11/27/2019   TSH 2.84  11/27/2019   INR 1.07 04/16/2016   HGBA1C 5.4 05/29/2019    No results found.  Assessment & Plan:   There are no diagnoses linked to this encounter.   No orders of the defined types were placed in this encounter.    Follow-up: No follow-ups on file.  [name], MD

## 2020-02-27 NOTE — Patient Instructions (Signed)
You can try [place]'s Mane Mushroom extract or capsules for memory   

## 2020-02-27 NOTE — Assessment & Plan Note (Signed)
Intolerant of Topamax

## 2020-02-27 NOTE — Assessment & Plan Note (Signed)
Dementia Chronic; psychosomatic issues On Fluoxetine and Abilify  Potential benefits of a long term benzodiazepines  use as well as potential risks  and complications were explained to the patient and were aknowledged.

## 2020-02-27 NOTE — Assessment & Plan Note (Signed)
Severe Labs

## 2020-02-28 LAB — CULTURE, URINE COMPREHENSIVE

## 2020-03-02 DIAGNOSIS — Z8701 Personal history of pneumonia (recurrent): Secondary | ICD-10-CM | POA: Diagnosis not present

## 2020-03-02 DIAGNOSIS — Z7982 Long term (current) use of aspirin: Secondary | ICD-10-CM | POA: Diagnosis not present

## 2020-03-02 DIAGNOSIS — F0390 Unspecified dementia without behavioral disturbance: Secondary | ICD-10-CM | POA: Diagnosis not present

## 2020-03-02 DIAGNOSIS — M48062 Spinal stenosis, lumbar region with neurogenic claudication: Secondary | ICD-10-CM | POA: Diagnosis not present

## 2020-03-02 DIAGNOSIS — N189 Chronic kidney disease, unspecified: Secondary | ICD-10-CM | POA: Diagnosis not present

## 2020-03-02 DIAGNOSIS — F329 Major depressive disorder, single episode, unspecified: Secondary | ICD-10-CM | POA: Diagnosis not present

## 2020-03-02 DIAGNOSIS — K5730 Diverticulosis of large intestine without perforation or abscess without bleeding: Secondary | ICD-10-CM | POA: Diagnosis not present

## 2020-03-02 DIAGNOSIS — I5032 Chronic diastolic (congestive) heart failure: Secondary | ICD-10-CM | POA: Diagnosis not present

## 2020-03-02 DIAGNOSIS — I872 Venous insufficiency (chronic) (peripheral): Secondary | ICD-10-CM | POA: Diagnosis not present

## 2020-03-02 DIAGNOSIS — J9611 Chronic respiratory failure with hypoxia: Secondary | ICD-10-CM | POA: Diagnosis not present

## 2020-03-02 DIAGNOSIS — E785 Hyperlipidemia, unspecified: Secondary | ICD-10-CM | POA: Diagnosis not present

## 2020-03-02 DIAGNOSIS — Z7401 Bed confinement status: Secondary | ICD-10-CM | POA: Diagnosis not present

## 2020-03-02 DIAGNOSIS — J4540 Moderate persistent asthma, uncomplicated: Secondary | ICD-10-CM | POA: Diagnosis not present

## 2020-03-02 DIAGNOSIS — K219 Gastro-esophageal reflux disease without esophagitis: Secondary | ICD-10-CM | POA: Diagnosis not present

## 2020-03-02 DIAGNOSIS — J449 Chronic obstructive pulmonary disease, unspecified: Secondary | ICD-10-CM | POA: Diagnosis not present

## 2020-03-02 DIAGNOSIS — G309 Alzheimer's disease, unspecified: Secondary | ICD-10-CM | POA: Diagnosis not present

## 2020-03-02 DIAGNOSIS — I739 Peripheral vascular disease, unspecified: Secondary | ICD-10-CM | POA: Diagnosis not present

## 2020-03-02 DIAGNOSIS — M810 Age-related osteoporosis without current pathological fracture: Secondary | ICD-10-CM | POA: Diagnosis not present

## 2020-03-02 DIAGNOSIS — G4733 Obstructive sleep apnea (adult) (pediatric): Secondary | ICD-10-CM | POA: Diagnosis not present

## 2020-03-02 DIAGNOSIS — Z6841 Body Mass Index (BMI) 40.0 and over, adult: Secondary | ICD-10-CM | POA: Diagnosis not present

## 2020-03-02 DIAGNOSIS — F411 Generalized anxiety disorder: Secondary | ICD-10-CM | POA: Diagnosis not present

## 2020-03-02 DIAGNOSIS — I130 Hypertensive heart and chronic kidney disease with heart failure and stage 1 through stage 4 chronic kidney disease, or unspecified chronic kidney disease: Secondary | ICD-10-CM | POA: Diagnosis not present

## 2020-03-02 DIAGNOSIS — G4700 Insomnia, unspecified: Secondary | ICD-10-CM | POA: Diagnosis not present

## 2020-03-02 DIAGNOSIS — F0280 Dementia in other diseases classified elsewhere without behavioral disturbance: Secondary | ICD-10-CM | POA: Diagnosis not present

## 2020-03-02 DIAGNOSIS — M1990 Unspecified osteoarthritis, unspecified site: Secondary | ICD-10-CM | POA: Diagnosis not present

## 2020-03-03 ENCOUNTER — Telehealth: Payer: Self-pay | Admitting: Internal Medicine

## 2020-03-03 NOTE — Telephone Encounter (Signed)
Sabrina Page with [facility] called and was wondering if it was ok for the patient to start taking Miralax as needed to help with bowel movements. The last time she had a bowel movement before the one over the weekend was 4 days prior.    Phone: [phone].

## 2020-03-04 MED ORDER — POLYETHYLENE GLYCOL 3350 17 GM/SCOOP PO POWD: 17.0000 g | Freq: Two times a day (BID) | ORAL | 1 refills | Status: DC | PRN

## 2020-03-04 NOTE — Telephone Encounter (Signed)
Yes, this would be fine.

## 2020-03-04 NOTE — Telephone Encounter (Signed)
Called Almyra Free back there was no answer LMOM w/MD response.Sent rx to pof....[name]

## 2020-03-07 DIAGNOSIS — N189 Chronic kidney disease, unspecified: Secondary | ICD-10-CM | POA: Diagnosis not present

## 2020-03-07 DIAGNOSIS — G309 Alzheimer's disease, unspecified: Secondary | ICD-10-CM | POA: Diagnosis not present

## 2020-03-07 DIAGNOSIS — F0280 Dementia in other diseases classified elsewhere without behavioral disturbance: Secondary | ICD-10-CM | POA: Diagnosis not present

## 2020-03-07 DIAGNOSIS — J449 Chronic obstructive pulmonary disease, unspecified: Secondary | ICD-10-CM | POA: Diagnosis not present

## 2020-03-07 DIAGNOSIS — I130 Hypertensive heart and chronic kidney disease with heart failure and stage 1 through stage 4 chronic kidney disease, or unspecified chronic kidney disease: Secondary | ICD-10-CM | POA: Diagnosis not present

## 2020-03-07 DIAGNOSIS — I5032 Chronic diastolic (congestive) heart failure: Secondary | ICD-10-CM | POA: Diagnosis not present

## 2020-03-10 DIAGNOSIS — I5032 Chronic diastolic (congestive) heart failure: Secondary | ICD-10-CM | POA: Diagnosis not present

## 2020-03-10 DIAGNOSIS — J449 Chronic obstructive pulmonary disease, unspecified: Secondary | ICD-10-CM | POA: Diagnosis not present

## 2020-03-10 DIAGNOSIS — F0280 Dementia in other diseases classified elsewhere without behavioral disturbance: Secondary | ICD-10-CM | POA: Diagnosis not present

## 2020-03-10 DIAGNOSIS — I130 Hypertensive heart and chronic kidney disease with heart failure and stage 1 through stage 4 chronic kidney disease, or unspecified chronic kidney disease: Secondary | ICD-10-CM | POA: Diagnosis not present

## 2020-03-10 DIAGNOSIS — G309 Alzheimer's disease, unspecified: Secondary | ICD-10-CM | POA: Diagnosis not present

## 2020-03-10 DIAGNOSIS — N189 Chronic kidney disease, unspecified: Secondary | ICD-10-CM | POA: Diagnosis not present

## 2020-03-14 DIAGNOSIS — F0280 Dementia in other diseases classified elsewhere without behavioral disturbance: Secondary | ICD-10-CM | POA: Diagnosis not present

## 2020-03-14 DIAGNOSIS — J449 Chronic obstructive pulmonary disease, unspecified: Secondary | ICD-10-CM | POA: Diagnosis not present

## 2020-03-14 DIAGNOSIS — N189 Chronic kidney disease, unspecified: Secondary | ICD-10-CM | POA: Diagnosis not present

## 2020-03-14 DIAGNOSIS — I130 Hypertensive heart and chronic kidney disease with heart failure and stage 1 through stage 4 chronic kidney disease, or unspecified chronic kidney disease: Secondary | ICD-10-CM | POA: Diagnosis not present

## 2020-03-14 DIAGNOSIS — I5032 Chronic diastolic (congestive) heart failure: Secondary | ICD-10-CM | POA: Diagnosis not present

## 2020-03-14 DIAGNOSIS — G309 Alzheimer's disease, unspecified: Secondary | ICD-10-CM | POA: Diagnosis not present

## 2020-03-17 ENCOUNTER — Other Ambulatory Visit: Payer: Self-pay

## 2020-03-17 ENCOUNTER — Encounter: Payer: Self-pay | Admitting: Neurology

## 2020-03-17 ENCOUNTER — Telehealth (INDEPENDENT_AMBULATORY_CARE_PROVIDER_SITE_OTHER): Payer: Medicare Other | Admitting: Neurology

## 2020-03-17 VITALS — Wt 250.0 lb

## 2020-03-17 DIAGNOSIS — F0280 Dementia in other diseases classified elsewhere without behavioral disturbance: Secondary | ICD-10-CM

## 2020-03-17 DIAGNOSIS — G309 Alzheimer's disease, unspecified: Secondary | ICD-10-CM | POA: Diagnosis not present

## 2020-03-17 DIAGNOSIS — I951 Orthostatic hypotension: Secondary | ICD-10-CM | POA: Diagnosis not present

## 2020-03-17 NOTE — Progress Notes (Signed)
   Virtual Visit via Video Note The purpose of this virtual visit is to provide medical care while limiting exposure to the novel coronavirus.    Consent was obtained for video visit:  Yes.   Answered questions that patient had about telehealth interaction:  Yes.   I discussed the limitations, risks, security and privacy concerns of performing an evaluation and management service by telemedicine. I also discussed with the patient that there may be a patient responsible charge related to this service. The patient expressed understanding and agreed to proceed.  Pt location: Home Physician Location: office Name of referring provider:  Plotnikov, Evie Lacks, MD I connected with Sabrina Page at patients initiation/request on 03/17/2020 at  8:30 AM EDT by video enabled telemedicine application and verified that I am speaking with the correct person using two identifiers. Pt MRN:  [id number] Pt DOB:  [birth year] Video Participants:  Sabrina Page;  daughter   History of Present Illness: This is a 86 y.o. female returning for follow-up of Alzheimer's dementia and orthostatic hypotension.  History is obtained from daughter who is also interpreting.  Daughter has noticed progressive deterioration in memory and physical abilities.  She is no longer ambulating because of right knee pain.  She gets home PT/OT who stand her with two-person assist in a standing frame.  She has not had any lightheaded spells.  She is predominately bed-bound. She is able to converse, but easily forgets details.  She can feed herself, otherwise dependent on all ADLs. She is incontinent of urine and stool.  She has a very good appetite and daughter is concerned with her weight gain.  She has also noticed a lesion on her nose which has been bleeding for several months. She has history of skin cancer and asking my opinion on management of this.   Observations/Objective:   Vitals:   03/17/20 0809  Weight: 250 lb (113.4  kg)   Patient is awake, alert, and appears comfortable.   Face appears symmetric.  She is sitting in a chair, drinking water. Arms are antigravity.   Assessment and Plan:  1.  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  She is much more sedentary and less symptomatic, so I will taper her medication  - Stop nighttime midodrine x 1 week, continue midodrine 5mg  in the morning  - Continue florinef 0.1mg  daily  - Continue mestinon 60mg  three times daily   2. Alzheimer's dementia without behavior changes, progressive.  Dependent on all ADLs except feeding.  - She nearly round the clock caregiver support  - Continue Namenda 10mg  BID  - Continue palliative care, she has requested reassessment for Hospice  3.  Weight gain  - I am no sure if there are in-home dietician, recommend asking PCP about this  4.  Skin lesion on her nose   - Follow-up with dermatology  Follow Up Instructions:   I discussed the assessment and treatment plan with the patient. The patient was provided an opportunity to ask questions and all were answered. The patient agreed with the plan and demonstrated an understanding of the instructions.   The patient was advised to call back or seek an in-person evaluation if the symptoms worsen or if the condition fails to improve as anticipated.  Follow-up in 6 months  Total time spent:  30 minutes     Alda Berthold, DO

## 2020-03-20 DIAGNOSIS — F329 Major depressive disorder, single episode, unspecified: Secondary | ICD-10-CM

## 2020-03-20 DIAGNOSIS — Z96652 Presence of left artificial knee joint: Secondary | ICD-10-CM

## 2020-03-20 DIAGNOSIS — G4700 Insomnia, unspecified: Secondary | ICD-10-CM

## 2020-03-20 DIAGNOSIS — J4540 Moderate persistent asthma, uncomplicated: Secondary | ICD-10-CM | POA: Diagnosis not present

## 2020-03-20 DIAGNOSIS — N189 Chronic kidney disease, unspecified: Secondary | ICD-10-CM | POA: Diagnosis not present

## 2020-03-20 DIAGNOSIS — Z9181 History of falling: Secondary | ICD-10-CM

## 2020-03-20 DIAGNOSIS — F411 Generalized anxiety disorder: Secondary | ICD-10-CM

## 2020-03-20 DIAGNOSIS — F0280 Dementia in other diseases classified elsewhere without behavioral disturbance: Secondary | ICD-10-CM | POA: Diagnosis not present

## 2020-03-20 DIAGNOSIS — Z7982 Long term (current) use of aspirin: Secondary | ICD-10-CM

## 2020-03-20 DIAGNOSIS — Z87891 Personal history of nicotine dependence: Secondary | ICD-10-CM

## 2020-03-20 DIAGNOSIS — K219 Gastro-esophageal reflux disease without esophagitis: Secondary | ICD-10-CM

## 2020-03-20 DIAGNOSIS — G4733 Obstructive sleep apnea (adult) (pediatric): Secondary | ICD-10-CM

## 2020-03-20 DIAGNOSIS — Z8701 Personal history of pneumonia (recurrent): Secondary | ICD-10-CM

## 2020-03-20 DIAGNOSIS — Z7401 Bed confinement status: Secondary | ICD-10-CM

## 2020-03-20 DIAGNOSIS — M48062 Spinal stenosis, lumbar region with neurogenic claudication: Secondary | ICD-10-CM | POA: Diagnosis not present

## 2020-03-20 DIAGNOSIS — I5032 Chronic diastolic (congestive) heart failure: Secondary | ICD-10-CM | POA: Diagnosis not present

## 2020-03-20 DIAGNOSIS — J9611 Chronic respiratory failure with hypoxia: Secondary | ICD-10-CM | POA: Diagnosis not present

## 2020-03-20 DIAGNOSIS — K5730 Diverticulosis of large intestine without perforation or abscess without bleeding: Secondary | ICD-10-CM

## 2020-03-20 DIAGNOSIS — I872 Venous insufficiency (chronic) (peripheral): Secondary | ICD-10-CM

## 2020-03-20 DIAGNOSIS — Z6841 Body Mass Index (BMI) 40.0 and over, adult: Secondary | ICD-10-CM

## 2020-03-20 DIAGNOSIS — M1990 Unspecified osteoarthritis, unspecified site: Secondary | ICD-10-CM | POA: Diagnosis not present

## 2020-03-20 DIAGNOSIS — I130 Hypertensive heart and chronic kidney disease with heart failure and stage 1 through stage 4 chronic kidney disease, or unspecified chronic kidney disease: Secondary | ICD-10-CM | POA: Diagnosis not present

## 2020-03-20 DIAGNOSIS — M810 Age-related osteoporosis without current pathological fracture: Secondary | ICD-10-CM | POA: Diagnosis not present

## 2020-03-20 DIAGNOSIS — G309 Alzheimer's disease, unspecified: Secondary | ICD-10-CM | POA: Diagnosis not present

## 2020-03-20 DIAGNOSIS — J449 Chronic obstructive pulmonary disease, unspecified: Secondary | ICD-10-CM | POA: Diagnosis not present

## 2020-03-20 DIAGNOSIS — I739 Peripheral vascular disease, unspecified: Secondary | ICD-10-CM | POA: Diagnosis not present

## 2020-03-20 DIAGNOSIS — E785 Hyperlipidemia, unspecified: Secondary | ICD-10-CM

## 2020-03-20 DIAGNOSIS — Z87440 Personal history of urinary (tract) infections: Secondary | ICD-10-CM

## 2020-03-22 DIAGNOSIS — N189 Chronic kidney disease, unspecified: Secondary | ICD-10-CM | POA: Diagnosis not present

## 2020-03-22 DIAGNOSIS — F0280 Dementia in other diseases classified elsewhere without behavioral disturbance: Secondary | ICD-10-CM | POA: Diagnosis not present

## 2020-03-22 DIAGNOSIS — I130 Hypertensive heart and chronic kidney disease with heart failure and stage 1 through stage 4 chronic kidney disease, or unspecified chronic kidney disease: Secondary | ICD-10-CM | POA: Diagnosis not present

## 2020-03-22 DIAGNOSIS — J449 Chronic obstructive pulmonary disease, unspecified: Secondary | ICD-10-CM | POA: Diagnosis not present

## 2020-03-22 DIAGNOSIS — G309 Alzheimer's disease, unspecified: Secondary | ICD-10-CM | POA: Diagnosis not present

## 2020-03-22 DIAGNOSIS — I5032 Chronic diastolic (congestive) heart failure: Secondary | ICD-10-CM | POA: Diagnosis not present

## 2020-03-24 DIAGNOSIS — I5032 Chronic diastolic (congestive) heart failure: Secondary | ICD-10-CM | POA: Diagnosis not present

## 2020-03-24 DIAGNOSIS — G309 Alzheimer's disease, unspecified: Secondary | ICD-10-CM | POA: Diagnosis not present

## 2020-03-24 DIAGNOSIS — F0280 Dementia in other diseases classified elsewhere without behavioral disturbance: Secondary | ICD-10-CM | POA: Diagnosis not present

## 2020-03-24 DIAGNOSIS — J449 Chronic obstructive pulmonary disease, unspecified: Secondary | ICD-10-CM | POA: Diagnosis not present

## 2020-03-24 DIAGNOSIS — N189 Chronic kidney disease, unspecified: Secondary | ICD-10-CM | POA: Diagnosis not present

## 2020-03-24 DIAGNOSIS — I130 Hypertensive heart and chronic kidney disease with heart failure and stage 1 through stage 4 chronic kidney disease, or unspecified chronic kidney disease: Secondary | ICD-10-CM | POA: Diagnosis not present

## 2020-03-31 DIAGNOSIS — N189 Chronic kidney disease, unspecified: Secondary | ICD-10-CM | POA: Diagnosis not present

## 2020-03-31 DIAGNOSIS — J449 Chronic obstructive pulmonary disease, unspecified: Secondary | ICD-10-CM | POA: Diagnosis not present

## 2020-03-31 DIAGNOSIS — I130 Hypertensive heart and chronic kidney disease with heart failure and stage 1 through stage 4 chronic kidney disease, or unspecified chronic kidney disease: Secondary | ICD-10-CM | POA: Diagnosis not present

## 2020-03-31 DIAGNOSIS — I5032 Chronic diastolic (congestive) heart failure: Secondary | ICD-10-CM | POA: Diagnosis not present

## 2020-03-31 DIAGNOSIS — G309 Alzheimer's disease, unspecified: Secondary | ICD-10-CM | POA: Diagnosis not present

## 2020-03-31 DIAGNOSIS — F0280 Dementia in other diseases classified elsewhere without behavioral disturbance: Secondary | ICD-10-CM | POA: Diagnosis not present

## 2020-04-01 DIAGNOSIS — F411 Generalized anxiety disorder: Secondary | ICD-10-CM | POA: Diagnosis not present

## 2020-04-01 DIAGNOSIS — M1990 Unspecified osteoarthritis, unspecified site: Secondary | ICD-10-CM | POA: Diagnosis not present

## 2020-04-01 DIAGNOSIS — Z6841 Body Mass Index (BMI) 40.0 and over, adult: Secondary | ICD-10-CM | POA: Diagnosis not present

## 2020-04-01 DIAGNOSIS — F0280 Dementia in other diseases classified elsewhere without behavioral disturbance: Secondary | ICD-10-CM | POA: Diagnosis not present

## 2020-04-01 DIAGNOSIS — M810 Age-related osteoporosis without current pathological fracture: Secondary | ICD-10-CM | POA: Diagnosis not present

## 2020-04-01 DIAGNOSIS — Z8701 Personal history of pneumonia (recurrent): Secondary | ICD-10-CM | POA: Diagnosis not present

## 2020-04-01 DIAGNOSIS — F329 Major depressive disorder, single episode, unspecified: Secondary | ICD-10-CM | POA: Diagnosis not present

## 2020-04-01 DIAGNOSIS — K219 Gastro-esophageal reflux disease without esophagitis: Secondary | ICD-10-CM | POA: Diagnosis not present

## 2020-04-01 DIAGNOSIS — K5730 Diverticulosis of large intestine without perforation or abscess without bleeding: Secondary | ICD-10-CM | POA: Diagnosis not present

## 2020-04-01 DIAGNOSIS — G4733 Obstructive sleep apnea (adult) (pediatric): Secondary | ICD-10-CM | POA: Diagnosis not present

## 2020-04-01 DIAGNOSIS — Z7982 Long term (current) use of aspirin: Secondary | ICD-10-CM | POA: Diagnosis not present

## 2020-04-01 DIAGNOSIS — J9611 Chronic respiratory failure with hypoxia: Secondary | ICD-10-CM | POA: Diagnosis not present

## 2020-04-01 DIAGNOSIS — J4540 Moderate persistent asthma, uncomplicated: Secondary | ICD-10-CM | POA: Diagnosis not present

## 2020-04-01 DIAGNOSIS — E785 Hyperlipidemia, unspecified: Secondary | ICD-10-CM | POA: Diagnosis not present

## 2020-04-01 DIAGNOSIS — J449 Chronic obstructive pulmonary disease, unspecified: Secondary | ICD-10-CM | POA: Diagnosis not present

## 2020-04-01 DIAGNOSIS — I5032 Chronic diastolic (congestive) heart failure: Secondary | ICD-10-CM | POA: Diagnosis not present

## 2020-04-01 DIAGNOSIS — G309 Alzheimer's disease, unspecified: Secondary | ICD-10-CM | POA: Diagnosis not present

## 2020-04-01 DIAGNOSIS — Z7401 Bed confinement status: Secondary | ICD-10-CM | POA: Diagnosis not present

## 2020-04-01 DIAGNOSIS — G4700 Insomnia, unspecified: Secondary | ICD-10-CM | POA: Diagnosis not present

## 2020-04-01 DIAGNOSIS — F0390 Unspecified dementia without behavioral disturbance: Secondary | ICD-10-CM | POA: Diagnosis not present

## 2020-04-01 DIAGNOSIS — I130 Hypertensive heart and chronic kidney disease with heart failure and stage 1 through stage 4 chronic kidney disease, or unspecified chronic kidney disease: Secondary | ICD-10-CM | POA: Diagnosis not present

## 2020-04-01 DIAGNOSIS — N189 Chronic kidney disease, unspecified: Secondary | ICD-10-CM | POA: Diagnosis not present

## 2020-04-01 DIAGNOSIS — I872 Venous insufficiency (chronic) (peripheral): Secondary | ICD-10-CM | POA: Diagnosis not present

## 2020-04-01 DIAGNOSIS — M48062 Spinal stenosis, lumbar region with neurogenic claudication: Secondary | ICD-10-CM | POA: Diagnosis not present

## 2020-04-01 DIAGNOSIS — I739 Peripheral vascular disease, unspecified: Secondary | ICD-10-CM | POA: Diagnosis not present

## 2020-04-07 DIAGNOSIS — I130 Hypertensive heart and chronic kidney disease with heart failure and stage 1 through stage 4 chronic kidney disease, or unspecified chronic kidney disease: Secondary | ICD-10-CM | POA: Diagnosis not present

## 2020-04-07 DIAGNOSIS — G309 Alzheimer's disease, unspecified: Secondary | ICD-10-CM | POA: Diagnosis not present

## 2020-04-07 DIAGNOSIS — N189 Chronic kidney disease, unspecified: Secondary | ICD-10-CM | POA: Diagnosis not present

## 2020-04-07 DIAGNOSIS — J449 Chronic obstructive pulmonary disease, unspecified: Secondary | ICD-10-CM | POA: Diagnosis not present

## 2020-04-07 DIAGNOSIS — F0280 Dementia in other diseases classified elsewhere without behavioral disturbance: Secondary | ICD-10-CM | POA: Diagnosis not present

## 2020-04-07 DIAGNOSIS — I5032 Chronic diastolic (congestive) heart failure: Secondary | ICD-10-CM | POA: Diagnosis not present

## 2020-04-15 DIAGNOSIS — F0280 Dementia in other diseases classified elsewhere without behavioral disturbance: Secondary | ICD-10-CM | POA: Diagnosis not present

## 2020-04-15 DIAGNOSIS — I5032 Chronic diastolic (congestive) heart failure: Secondary | ICD-10-CM | POA: Diagnosis not present

## 2020-04-15 DIAGNOSIS — N189 Chronic kidney disease, unspecified: Secondary | ICD-10-CM | POA: Diagnosis not present

## 2020-04-15 DIAGNOSIS — J449 Chronic obstructive pulmonary disease, unspecified: Secondary | ICD-10-CM | POA: Diagnosis not present

## 2020-04-15 DIAGNOSIS — I130 Hypertensive heart and chronic kidney disease with heart failure and stage 1 through stage 4 chronic kidney disease, or unspecified chronic kidney disease: Secondary | ICD-10-CM | POA: Diagnosis not present

## 2020-04-15 DIAGNOSIS — G309 Alzheimer's disease, unspecified: Secondary | ICD-10-CM | POA: Diagnosis not present

## 2020-04-23 DIAGNOSIS — I130 Hypertensive heart and chronic kidney disease with heart failure and stage 1 through stage 4 chronic kidney disease, or unspecified chronic kidney disease: Secondary | ICD-10-CM | POA: Diagnosis not present

## 2020-04-23 DIAGNOSIS — J449 Chronic obstructive pulmonary disease, unspecified: Secondary | ICD-10-CM | POA: Diagnosis not present

## 2020-04-23 DIAGNOSIS — G309 Alzheimer's disease, unspecified: Secondary | ICD-10-CM | POA: Diagnosis not present

## 2020-04-23 DIAGNOSIS — N189 Chronic kidney disease, unspecified: Secondary | ICD-10-CM | POA: Diagnosis not present

## 2020-04-23 DIAGNOSIS — I5032 Chronic diastolic (congestive) heart failure: Secondary | ICD-10-CM | POA: Diagnosis not present

## 2020-04-23 DIAGNOSIS — F0280 Dementia in other diseases classified elsewhere without behavioral disturbance: Secondary | ICD-10-CM | POA: Diagnosis not present

## 2020-04-28 ENCOUNTER — Telehealth: Payer: Self-pay | Admitting: Internal Medicine

## 2020-04-28 ENCOUNTER — Emergency Department (HOSPITAL_COMMUNITY)
Admission: EM | Admit: 2020-04-28 | Discharge: 2020-04-28 | Disposition: A | Discharge status: Home / Self Care | Payer: Medicare Other | Attending: Emergency Medicine | Admitting: Emergency Medicine

## 2020-04-28 ENCOUNTER — Emergency Department (HOSPITAL_COMMUNITY): Payer: Medicare Other

## 2020-04-28 DIAGNOSIS — J449 Chronic obstructive pulmonary disease, unspecified: Secondary | ICD-10-CM | POA: Insufficient documentation

## 2020-04-28 DIAGNOSIS — R404 Transient alteration of awareness: Secondary | ICD-10-CM | POA: Diagnosis not present

## 2020-04-28 DIAGNOSIS — I5032 Chronic diastolic (congestive) heart failure: Secondary | ICD-10-CM | POA: Diagnosis not present

## 2020-04-28 DIAGNOSIS — I130 Hypertensive heart and chronic kidney disease with heart failure and stage 1 through stage 4 chronic kidney disease, or unspecified chronic kidney disease: Secondary | ICD-10-CM | POA: Diagnosis not present

## 2020-04-28 DIAGNOSIS — N189 Chronic kidney disease, unspecified: Secondary | ICD-10-CM | POA: Diagnosis not present

## 2020-04-28 DIAGNOSIS — J4540 Moderate persistent asthma, uncomplicated: Secondary | ICD-10-CM | POA: Insufficient documentation

## 2020-04-28 DIAGNOSIS — Z743 Need for continuous supervision: Secondary | ICD-10-CM | POA: Diagnosis not present

## 2020-04-28 DIAGNOSIS — Z7901 Long term (current) use of anticoagulants: Secondary | ICD-10-CM | POA: Insufficient documentation

## 2020-04-28 DIAGNOSIS — R410 Disorientation, unspecified: Secondary | ICD-10-CM | POA: Diagnosis not present

## 2020-04-28 DIAGNOSIS — R531 Weakness: Secondary | ICD-10-CM | POA: Diagnosis not present

## 2020-04-28 DIAGNOSIS — Z7401 Bed confinement status: Secondary | ICD-10-CM | POA: Diagnosis not present

## 2020-04-28 DIAGNOSIS — M2550 Pain in unspecified joint: Secondary | ICD-10-CM | POA: Diagnosis not present

## 2020-04-28 DIAGNOSIS — G309 Alzheimer's disease, unspecified: Secondary | ICD-10-CM | POA: Insufficient documentation

## 2020-04-28 DIAGNOSIS — R464 Slowness and poor responsiveness: Secondary | ICD-10-CM | POA: Diagnosis not present

## 2020-04-28 DIAGNOSIS — R569 Unspecified convulsions: Secondary | ICD-10-CM | POA: Diagnosis not present

## 2020-04-28 DIAGNOSIS — Z7982 Long term (current) use of aspirin: Secondary | ICD-10-CM | POA: Insufficient documentation

## 2020-04-28 DIAGNOSIS — R4189 Other symptoms and signs involving cognitive functions and awareness: Secondary | ICD-10-CM

## 2020-04-28 DIAGNOSIS — I10 Essential (primary) hypertension: Secondary | ICD-10-CM | POA: Diagnosis not present

## 2020-04-28 DIAGNOSIS — Z87891 Personal history of nicotine dependence: Secondary | ICD-10-CM | POA: Insufficient documentation

## 2020-04-28 DIAGNOSIS — I5033 Acute on chronic diastolic (congestive) heart failure: Secondary | ICD-10-CM | POA: Diagnosis not present

## 2020-04-28 DIAGNOSIS — R6889 Other general symptoms and signs: Secondary | ICD-10-CM | POA: Diagnosis not present

## 2020-04-28 DIAGNOSIS — F0280 Dementia in other diseases classified elsewhere without behavioral disturbance: Secondary | ICD-10-CM | POA: Diagnosis not present

## 2020-04-28 DIAGNOSIS — R4781 Slurred speech: Secondary | ICD-10-CM | POA: Diagnosis not present

## 2020-04-28 LAB — RAPID URINE DRUG SCREEN, HOSP PERFORMED
Amphetamines: NOT DETECTED
Barbiturates: NOT DETECTED
Benzodiazepines: NOT DETECTED
Cocaine: NOT DETECTED
Opiates: NOT DETECTED
Tetrahydrocannabinol: NOT DETECTED

## 2020-04-28 LAB — BASIC METABOLIC PANEL
Anion gap: 10 (ref 5–15)
BUN: 14 mg/dL (ref 8–23)
CO2: 31 mmol/L (ref 22–32)
Calcium: 9 mg/dL (ref 8.9–10.3)
Chloride: 100 mmol/L (ref 98–111)
Creatinine, Ser: 1 mg/dL (ref 0.44–1.00)
GFR, Estimated: 56 mL/min — ABNORMAL LOW (ref >60)
Glucose, Bld: 88 mg/dL (ref 70–99)
Potassium: 4.1 mmol/L (ref 3.5–5.1)
Sodium: 141 mmol/L (ref 135–145)

## 2020-04-28 LAB — CBC WITH DIFFERENTIAL/PLATELET
Abs Immature Granulocytes: 0.04 10*3/uL (ref 0.00–0.07)
Basophils Absolute: 0.1 10*3/uL (ref 0.0–0.1)
Basophils Relative: 1 %
Eosinophils Absolute: 0.6 10*3/uL — ABNORMAL HIGH (ref 0.0–0.5)
Eosinophils Relative: 10 %
HCT: 45.3 % (ref 36.0–46.0)
Hemoglobin: 14.2 g/dL (ref 12.0–15.0)
Immature Granulocytes: 1 %
Lymphocytes Relative: 30 %
Lymphs Abs: 1.7 10*3/uL (ref 0.7–4.0)
MCH: 29.3 pg (ref 26.0–34.0)
MCHC: 31.3 g/dL (ref 30.0–36.0)
MCV: 93.6 fL (ref 80.0–100.0)
Monocytes Absolute: 0.6 10*3/uL (ref 0.1–1.0)
Monocytes Relative: 10 %
Neutro Abs: 2.8 10*3/uL (ref 1.7–7.7)
Neutrophils Relative %: 48 %
Platelets: 159 10*3/uL (ref 150–400)
RBC: 4.84 MIL/uL (ref 3.87–5.11)
RDW: 12.8 % (ref 11.5–15.5)
WBC: 5.8 10*3/uL (ref 4.0–10.5)
nRBC: 0 % (ref 0.0–0.2)

## 2020-04-28 LAB — URINALYSIS, ROUTINE W REFLEX MICROSCOPIC
Bilirubin Urine: NEGATIVE
Glucose, UA: NEGATIVE mg/dL
Hgb urine dipstick: NEGATIVE
Ketones, ur: NEGATIVE mg/dL
Leukocytes,Ua: NEGATIVE
Nitrite: NEGATIVE
Protein, ur: NEGATIVE mg/dL
Specific Gravity, Urine: 1.01 (ref 1.005–1.030)
pH: 5 (ref 5.0–8.0)

## 2020-04-28 LAB — ETHANOL: Alcohol, Ethyl (B): <10 mg/dL (ref <10)

## 2020-04-28 LAB — CBG MONITORING, ED: Glucose-Capillary: 88 mg/dL (ref 70–99)

## 2020-04-28 LAB — LACTIC ACID, PLASMA: Lactic Acid, Venous: 1.3 mmol/L (ref 0.5–1.9)

## 2020-04-28 MED ORDER — SODIUM CHLORIDE 0.9 % IV BOLUS (SEPSIS)
1000.0000 mL | Freq: Once | INTRAVENOUS | Status: AC
  Administered 2020-04-28: 1000 mL via INTRAVENOUS

## 2020-04-28 MED ORDER — SODIUM CHLORIDE 0.9 % IV SOLN: 1000.0000 mL | INTRAVENOUS | Status: DC

## 2020-04-28 NOTE — ED Notes (Signed)
Patient transported to CT 

## 2020-04-28 NOTE — Telephone Encounter (Signed)
Dr Alain Marion made verbally aware of daughter's phone call last pm & opened note in his office.  PCP states he prefers pt be seen in ED & may need EMS transport to get there d/t comorbidities. [name] notified & states she will get her mother to ED as soon as possible.

## 2020-04-28 NOTE — Discharge Instructions (Addendum)
It was our pleasure to provide your ER care today - we hope that you feel better.  Rest. Drink plenty of fluids, eat balanced diet.   We have made a referral to maximize your home health resources.   Follow up with primary care doctor/neurologist in the next 1-2 weeks - call your doctor tomorrow to arrange follow up.   Return to ER if worse, new symptoms, fevers, trouble breathing, new or severe pain, chest pain, fainting, seizures, or other concern.

## 2020-04-28 NOTE — ED Triage Notes (Signed)
Pt brought to ED via EMS from home with c/o seizure-like activity last night per daughter. Per daughter, symptoms resolved following episode, pt returned to cognitive baseline. Pt called PCP today, sent to ED for evaluation. No acute distress.  EMS v/s: 110/84 80 HR 177 CBG 16 RR 96% room air

## 2020-04-28 NOTE — Telephone Encounter (Signed)
Noted. Thanks.

## 2020-04-28 NOTE — Telephone Encounter (Signed)
[facility] called  Patient daughter states this morning her mother wasn't able to talk and having seizures. She is okay right now. She wants to know what to do. Feet/legs were blue. Lips were white. Care giver was there vs. dtr at that time. She didn't know where she was but this happened for over 10 min. BP does drop but she is medicated, has orthostatic. Not walking. 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   [facility] advised: Go to ED Now  Patient disagreed and would rather wait to see PCP vs go to EMS at the time. Would like to speak with pcp in regards to recent changes to medication that she is attributing the symptoms to.

## 2020-04-28 NOTE — ED Notes (Signed)
ED tech reported low BP when obtaining patient's vital signs on arrival. Smaller cuff applied to patient by RN, BP 150/64.

## 2020-04-28 NOTE — ED Provider Notes (Signed)
[facility] EMERGENCY DEPARTMENT Provider Note   CSN: [id number] Arrival date & time: 04/28/20  1419     History Chief Complaint  Patient presents with  . Seizures    Sabrina Page is a 86 y.o. female.  HPI   According to the EMS report the patient had seizure-like activity last evening.  Unclear about the duration of the episode.  Patient reportedly returned back to her baseline.  Daughter called the patient's doctor this morning who recommended she come to the ED for evaluation.  Patient herself cannot tell me what occurred.  She does not have any complaints.  She denies having trouble with headache chest pain fevers chills vomiting or diarrhea.  Past Medical History:  Diagnosis Date  . Allergy    rhinitis  . Asthma   . Chronic kidney disease    hx of cystitis   . COPD (chronic obstructive pulmonary disease) ([place])   . Depression   . Dizziness    vertigo   . Gallstones   . GERD (gastroesophageal reflux disease)   . Hyperlipidemia   . Hypertension   . Obesity   . Osteoarthritis   . Osteoporosis   . Peripheral vascular disease ([facility])    swelling   . Pneumonia   . Shortness of breath    with exertion   . Shoulder injury    left  . Sleep apnea    never diagnosed   . Urinary incontinence     Patient Active Problem List   Diagnosis Date Noted  . Acute on chronic diastolic congestive heart failure ([place]) 11/17/2019  . UTI (urinary tract infection) 05/29/2019  . Urinary incontinence 11/01/2018  . Seborrheic dermatitis 06/14/2018  . Chronic respiratory failure with hypoxia ([place]) 04/14/2018  . Alzheimer disease ([place]) 06/10/2017  . Orthostatic hypotension dysautonomic syndrome 03/20/2017  . Knee pain, right 12/27/2016  . Closed right hip fracture ([place]) 04/15/2016  . Hip fracture ([place]) 04/15/2016  . Syncope due to orthostatic hypotension 01/18/2016  . Orthostatic hypotension 01/18/2016  . Hip pain, chronic 12/25/2015  . Carotid bruit  06/26/2015  . Fall at home 02/01/2015  . Concussion without loss of consciousness 09/13/2014  . Morbid obesity ([place]) 03/06/2014  . Right tennis elbow 01/02/2014  . Wart 01/02/2014  . Chronic fatigue 06/25/2013  . External hemorrhoid, bleeding 02/14/2013  . Obstructive sleep apnea 02/10/2012  . Low back pain radiating to both legs 05/14/2011  . Falls frequently 05/14/2011  . Fatigue 04/22/2011  . Hyperglycemia 04/22/2011  . Neoplasm of uncertain behavior of skin 01/11/2011  . Anxiety state 07/06/2010  . SHOULDER PAIN 05/21/2010  . CONCUSSION WITH LOSS OF CONSCIOUSNESS 05/21/2010  . Actinic keratosis 04/08/2010  . Carpal tunnel syndrome 11/28/2009  . NECK PAIN 11/28/2009  . DIZZINESS 11/28/2009  . INSOMNIA, CHRONIC 06/23/2009  . TOBACCO USE, QUIT 04/11/2009  . DIVERTICULOSIS OF COLON 09/06/2008  . DIVERTICULITIS OF COLON 09/06/2008  . Unspecified chest pain 09/06/2008  . GERD 06/05/2008  . Osteoarthritis 06/05/2008  . ABNORMAL GLUCOSE NEC 06/05/2008  . BRONCHITIS, ACUTE 04/04/2008  . APHTHOUS STOMATITIS 03/25/2008  . Venous (peripheral) insufficiency 11/03/2007  . Edema 11/03/2007  . SHINGLES 10/23/2007  . DENTAL PAIN 10/23/2007  . PARESTHESIA 10/23/2007  . Pain in limb 07/06/2007  . HYPERLIPIDEMIA 02/17/2007  . Depression 02/17/2007  . Essential hypertension 02/17/2007  . Allergic rhinitis 02/17/2007  . Asthma, moderate persistent, well-controlled 02/17/2007  . Dyspnea on exertion 02/17/2007  . SYMPTOM, INCONTINENCE, URGE 02/17/2007    Past Surgical  History:  Procedure Laterality Date  . CHOLECYSTECTOMY  09/14/2011   Procedure: LAPAROSCOPIC CHOLECYSTECTOMY;  Surgeon: Odis Hollingshead, MD;  Location: [facility] ORS;  Service: General;  Laterality: N/A;  attempted intraoperative cholangiogram   . HIP ARTHROPLASTY Right 04/17/2016   Procedure: ARTHROPLASTY BIPOLAR HIP (HEMIARTHROPLASTY);  Surgeon: Paralee Cancel, MD;  Location: [facility];  Service: Orthopedics;  Laterality: Right;   . L TKR    . LIVER BIOPSY  09/14/2011   Procedure: LIVER BIOPSY;  Surgeon: Odis Hollingshead, MD;  Location: [facility];  Service: General;  Laterality: N/A;  . OTHER SURGICAL HISTORY     left leg surgery   . OTHER SURGICAL HISTORY     left breast surgery due to mastitis   . right left knee arthroplast       OB History   No obstetric history on file.     Family History  Problem Relation Age of Onset  . Hypertension Mother   . Arthritis Mother   . Diabetes Mother   . Stroke Mother   . Stroke Father   . Hypertension Father   . Hypertension Other   . CAD Neg Hx     Social History   Tobacco Use  . Smoking status: Former Smoker    Packs/day: 0.50    Years: 35.00    Pack years: 17.50    Types: Cigarettes    Quit date: 05/24/2001    Years since quitting: 18.9  . Smokeless tobacco: Never Used  Vaping Use  . Vaping Use: Never used  Substance Use Topics  . Alcohol use: Yes    Comment: socially   . Drug use: No    Home Medications Prior to Admission medications   Medication Sig Start Date End Date Taking? Authorizing Provider  ARIPiprazole (ABILIFY) 2 MG tablet TAKE 1 TABLET BY MOUTH EVERY DAY 12/03/19   Plotnikov, Evie Lacks, MD  aspirin 81 MG EC tablet Take 1 tablet (81 mg total) by mouth 2 (two) times daily. 07/19/16   Plotnikov, Evie Lacks, MD  B Complex-C (SUPER B COMPLEX PO) Take 1 tablet by mouth every morning.     [provider]  cephALEXin (KEFLEX) 500 MG capsule Take 1 capsule (500 mg total) by mouth 2 (two) times daily. Patient not taking: Reported on 03/17/2020 01/02/20   Chevis Pretty, FNP  Cholecalciferol 1000 UNITS capsule Take 1,000 Units by mouth every morning.     [provider]  ciprofloxacin (CIPRO) 500 MG tablet Take 1 tablet (500 mg total) by mouth 2 (two) times daily. 02/27/20   Plotnikov, Evie Lacks, MD  fludrocortisone (FLORINEF) 0.1 MG tablet TAKE 1 TABLET BY MOUTH EVERY DAY 08/23/19   Plotnikov, Evie Lacks, MD  FLUoxetine (PROZAC)  40 MG capsule TAKE 1 CAPSULE BY MOUTH EVERY DAY 08/27/19   Plotnikov, Evie Lacks, MD  furosemide (LASIX) 40 MG tablet TAKE 1 TABLET (40 MG TOTAL) BY MOUTH DAILY AS NEEDED FOR EDEMA. 02/16/20 02/15/21  Plotnikov, Evie Lacks, MD  ipratropium-albuterol (DUONEB) 0.5-2.5 (3) MG/3ML SOLN 1 neb every 6 hours if needed 04/13/18   Baird Lyons D, MD  memantine (NAMENDA) 10 MG tablet TAKE 1 TABLET BY MOUTH TWICE A DAY 08/27/19   Plotnikov, Evie Lacks, MD  midodrine (PROAMATINE) 10 MG tablet Take 0.5 tablets (5 mg total) by mouth in the morning and at bedtime. 09/14/19   Patel, Arvin Collard K, DO  Omega-3 Fatty Acids (FISH OIL) 1000 MG CAPS Take 1 capsule by mouth every morning.  [provider]  omeprazole (PRILOSEC) 20 MG capsule Take 2 capsules (40 mg total) by mouth daily. 08/03/19   Plotnikov, Evie Lacks, MD  polyethylene glycol powder (GLYCOLAX/MIRALAX) 17 GM/SCOOP powder Take 17 g by mouth 2 (two) times daily as needed for moderate constipation or severe constipation. 03/04/20   Biagio Borg, MD  potassium chloride (KLOR-CON) 8 MEQ tablet Take 1 tablet (8 mEq total) by mouth every morning. 08/03/19   Plotnikov, Evie Lacks, MD  pyridostigmine (MESTINON) 60 MG tablet TAKE 1 TABLET (60 MG TOTAL) BY MOUTH 3 (THREE) TIMES DAILY. 08/23/19   Patel, Arvin Collard K, DO  traMADol (ULTRAM) 50 MG tablet Take 1 tablet (50 mg total) by mouth every 12 (twelve) hours as needed for severe pain. 09/20/17   Plotnikov, Evie Lacks, MD    Allergies    Atorvastatin, Enalapril maleate, Hctz [hydrochlorothiazide], Lovastatin, Namenda [memantine hcl], Simvastatin, and Topamax [topiramate]  Review of Systems   Review of Systems  All other systems reviewed and are negative.   Physical Exam Updated Vital Signs BP (!) 85/54   Pulse 77   Temp 98.4 F (36.9 C)   Resp (!) 22   SpO2 98%   Physical Exam Vitals and nursing note reviewed.  Constitutional:      General: She is not in acute distress.    Appearance: She is well-developed.   HENT:     Head: Normocephalic and atraumatic.     Right Ear: External ear normal.     Left Ear: External ear normal.  Eyes:     General: No scleral icterus.       Right eye: No discharge.        Left eye: No discharge.     Conjunctiva/sclera: Conjunctivae normal.  Neck:     Trachea: No tracheal deviation.  Cardiovascular:     Rate and Rhythm: Normal rate and regular rhythm.  Pulmonary:     Effort: Pulmonary effort is normal. No respiratory distress.     Breath sounds: Normal breath sounds. No stridor. No wheezing or rales.  Abdominal:     General: Bowel sounds are normal. There is no distension.     Palpations: Abdomen is soft.     Tenderness: There is no abdominal tenderness. There is no guarding or rebound.  Musculoskeletal:        General: No tenderness.     Cervical back: Neck supple.  Skin:    General: Skin is warm and dry.     Findings: No rash.  Neurological:     Mental Status: She is alert.     Cranial Nerves: No cranial nerve deficit (no facial droop, extraocular movements intact, no slurred speech).     Sensory: No sensory deficit.     Motor: No abnormal muscle tone or seizure activity.     Coordination: Coordination normal.     ED Results / Procedures / Treatments   Labs (all labs ordered are listed, but only abnormal results are displayed) Labs Reviewed  BASIC METABOLIC PANEL  CBC WITH DIFFERENTIAL/PLATELET  RAPID URINE DRUG SCREEN, HOSP PERFORMED  ETHANOL  URINALYSIS, ROUTINE W REFLEX MICROSCOPIC  CBG MONITORING, ED    EKG None  Radiology No results found.  Procedures Procedures (including critical care time)  Medications Ordered in ED Medications - No data to display  ED Course  I have reviewed the triage vital signs and the nursing notes.  Pertinent labs & imaging results that were available during my care of the patient were reviewed  by me and considered in my medical decision making (see chart for details).  Clinical Course as of Apr 28 1533  Mon Apr 28, 2020  1534 Initial blood pressure reportedly at 85/54. Repeat is 150/64   [JK]    Clinical Course User Index [JK] Dorie Rank, MD   MDM Rules/Calculators/A&P                          Pt presented for evaluation of seizure like episode yesterday.  Pt denies any complaints today.  Initial BP low but repeat is normal.  Suspect initial bp may have been erroneous.  Will proceed with labs, head ct.  Care turned over to oncoming MD Final Clinical Impression(s) / ED Diagnoses pending   Dorie Rank, MD 04/28/20 1535

## 2020-04-28 NOTE — ED Provider Notes (Addendum)
Signed out by Dr Tomi Bamberger to check labs, and d/c to home if workup negative for acute process.  Labs reviewed - no significant acute abn noted.   CT head neg for acute process.   During 7+ hours of ED observation, no seizure activity noted, no faintness or dizziness.   On recheck, pt is breathing comfortably, no distress, vital signs normal.  Pt currently appears stable for d/c.   Rec pcp/neurology outpt follow up. Also recommend maximization of home health resources.   Return precautions provided.        Lajean Saver, MD 04/28/20 2147

## 2020-04-29 NOTE — Progress Notes (Signed)
    Pt is active with [facility]the home-based [facility] division of [facility].  She receives nurse and social work visits at home for added support as she navigates her illness.  Please call [facility] if we can be of assistance.  Wynetta Fines, RN  [phone] Mobile [phone]

## 2020-05-01 DIAGNOSIS — F411 Generalized anxiety disorder: Secondary | ICD-10-CM | POA: Diagnosis not present

## 2020-05-01 DIAGNOSIS — I872 Venous insufficiency (chronic) (peripheral): Secondary | ICD-10-CM | POA: Diagnosis not present

## 2020-05-01 DIAGNOSIS — Z8701 Personal history of pneumonia (recurrent): Secondary | ICD-10-CM | POA: Diagnosis not present

## 2020-05-01 DIAGNOSIS — J9611 Chronic respiratory failure with hypoxia: Secondary | ICD-10-CM | POA: Diagnosis not present

## 2020-05-01 DIAGNOSIS — F329 Major depressive disorder, single episode, unspecified: Secondary | ICD-10-CM | POA: Diagnosis not present

## 2020-05-01 DIAGNOSIS — J449 Chronic obstructive pulmonary disease, unspecified: Secondary | ICD-10-CM | POA: Diagnosis not present

## 2020-05-01 DIAGNOSIS — I5032 Chronic diastolic (congestive) heart failure: Secondary | ICD-10-CM | POA: Diagnosis not present

## 2020-05-01 DIAGNOSIS — Z7982 Long term (current) use of aspirin: Secondary | ICD-10-CM | POA: Diagnosis not present

## 2020-05-01 DIAGNOSIS — Z7401 Bed confinement status: Secondary | ICD-10-CM | POA: Diagnosis not present

## 2020-05-01 DIAGNOSIS — G4733 Obstructive sleep apnea (adult) (pediatric): Secondary | ICD-10-CM | POA: Diagnosis not present

## 2020-05-01 DIAGNOSIS — I739 Peripheral vascular disease, unspecified: Secondary | ICD-10-CM | POA: Diagnosis not present

## 2020-05-01 DIAGNOSIS — G4700 Insomnia, unspecified: Secondary | ICD-10-CM | POA: Diagnosis not present

## 2020-05-01 DIAGNOSIS — E785 Hyperlipidemia, unspecified: Secondary | ICD-10-CM | POA: Diagnosis not present

## 2020-05-01 DIAGNOSIS — J4540 Moderate persistent asthma, uncomplicated: Secondary | ICD-10-CM | POA: Diagnosis not present

## 2020-05-01 DIAGNOSIS — M48062 Spinal stenosis, lumbar region with neurogenic claudication: Secondary | ICD-10-CM | POA: Diagnosis not present

## 2020-05-01 DIAGNOSIS — G309 Alzheimer's disease, unspecified: Secondary | ICD-10-CM | POA: Diagnosis not present

## 2020-05-01 DIAGNOSIS — M1990 Unspecified osteoarthritis, unspecified site: Secondary | ICD-10-CM | POA: Diagnosis not present

## 2020-05-01 DIAGNOSIS — K219 Gastro-esophageal reflux disease without esophagitis: Secondary | ICD-10-CM | POA: Diagnosis not present

## 2020-05-01 DIAGNOSIS — I130 Hypertensive heart and chronic kidney disease with heart failure and stage 1 through stage 4 chronic kidney disease, or unspecified chronic kidney disease: Secondary | ICD-10-CM | POA: Diagnosis not present

## 2020-05-01 DIAGNOSIS — N189 Chronic kidney disease, unspecified: Secondary | ICD-10-CM | POA: Diagnosis not present

## 2020-05-01 DIAGNOSIS — Z6841 Body Mass Index (BMI) 40.0 and over, adult: Secondary | ICD-10-CM | POA: Diagnosis not present

## 2020-05-01 DIAGNOSIS — K5730 Diverticulosis of large intestine without perforation or abscess without bleeding: Secondary | ICD-10-CM | POA: Diagnosis not present

## 2020-05-01 DIAGNOSIS — M810 Age-related osteoporosis without current pathological fracture: Secondary | ICD-10-CM | POA: Diagnosis not present

## 2020-05-01 DIAGNOSIS — F0280 Dementia in other diseases classified elsewhere without behavioral disturbance: Secondary | ICD-10-CM | POA: Diagnosis not present

## 2020-05-03 LAB — CULTURE, BLOOD (ROUTINE X 2)
Culture: NO GROWTH
Special Requests: ADEQUATE

## 2020-05-04 ENCOUNTER — Other Ambulatory Visit: Payer: Self-pay | Admitting: Internal Medicine

## 2020-05-05 DIAGNOSIS — G309 Alzheimer's disease, unspecified: Secondary | ICD-10-CM | POA: Diagnosis not present

## 2020-05-05 DIAGNOSIS — J449 Chronic obstructive pulmonary disease, unspecified: Secondary | ICD-10-CM | POA: Diagnosis not present

## 2020-05-05 DIAGNOSIS — I5032 Chronic diastolic (congestive) heart failure: Secondary | ICD-10-CM | POA: Diagnosis not present

## 2020-05-05 DIAGNOSIS — I130 Hypertensive heart and chronic kidney disease with heart failure and stage 1 through stage 4 chronic kidney disease, or unspecified chronic kidney disease: Secondary | ICD-10-CM | POA: Diagnosis not present

## 2020-05-05 DIAGNOSIS — F0280 Dementia in other diseases classified elsewhere without behavioral disturbance: Secondary | ICD-10-CM | POA: Diagnosis not present

## 2020-05-05 DIAGNOSIS — N189 Chronic kidney disease, unspecified: Secondary | ICD-10-CM | POA: Diagnosis not present

## 2020-05-09 DIAGNOSIS — G309 Alzheimer's disease, unspecified: Secondary | ICD-10-CM | POA: Diagnosis not present

## 2020-05-09 DIAGNOSIS — I130 Hypertensive heart and chronic kidney disease with heart failure and stage 1 through stage 4 chronic kidney disease, or unspecified chronic kidney disease: Secondary | ICD-10-CM | POA: Diagnosis not present

## 2020-05-09 DIAGNOSIS — I5032 Chronic diastolic (congestive) heart failure: Secondary | ICD-10-CM | POA: Diagnosis not present

## 2020-05-09 DIAGNOSIS — N189 Chronic kidney disease, unspecified: Secondary | ICD-10-CM | POA: Diagnosis not present

## 2020-05-09 DIAGNOSIS — F0280 Dementia in other diseases classified elsewhere without behavioral disturbance: Secondary | ICD-10-CM | POA: Diagnosis not present

## 2020-05-09 DIAGNOSIS — J449 Chronic obstructive pulmonary disease, unspecified: Secondary | ICD-10-CM | POA: Diagnosis not present

## 2020-05-12 ENCOUNTER — Telehealth: Payer: Self-pay | Admitting: Internal Medicine

## 2020-05-12 NOTE — Telephone Encounter (Signed)
Okay.  Thanks.

## 2020-05-12 NOTE — Telephone Encounter (Signed)
Sabrina Page with [facility]  Patient  exposed on 12.17.21, family member diagnosed on 12.19.21 with active covid  Would like to postpone Home health visits until 2 weeks from  12.19.21  [phone] secured voicemail

## 2020-05-12 NOTE — Telephone Encounter (Signed)
Notified Titianna w/MD response.Marland KitchenJohny Chess

## 2020-05-26 DIAGNOSIS — I5032 Chronic diastolic (congestive) heart failure: Secondary | ICD-10-CM | POA: Diagnosis not present

## 2020-05-26 DIAGNOSIS — I130 Hypertensive heart and chronic kidney disease with heart failure and stage 1 through stage 4 chronic kidney disease, or unspecified chronic kidney disease: Secondary | ICD-10-CM | POA: Diagnosis not present

## 2020-05-26 DIAGNOSIS — F0280 Dementia in other diseases classified elsewhere without behavioral disturbance: Secondary | ICD-10-CM | POA: Diagnosis not present

## 2020-05-26 DIAGNOSIS — J449 Chronic obstructive pulmonary disease, unspecified: Secondary | ICD-10-CM | POA: Diagnosis not present

## 2020-05-26 DIAGNOSIS — G309 Alzheimer's disease, unspecified: Secondary | ICD-10-CM | POA: Diagnosis not present

## 2020-05-26 DIAGNOSIS — N189 Chronic kidney disease, unspecified: Secondary | ICD-10-CM | POA: Diagnosis not present

## 2020-05-31 DIAGNOSIS — G309 Alzheimer's disease, unspecified: Secondary | ICD-10-CM | POA: Diagnosis not present

## 2020-05-31 DIAGNOSIS — F0280 Dementia in other diseases classified elsewhere without behavioral disturbance: Secondary | ICD-10-CM | POA: Diagnosis not present

## 2020-05-31 DIAGNOSIS — E785 Hyperlipidemia, unspecified: Secondary | ICD-10-CM | POA: Diagnosis not present

## 2020-05-31 DIAGNOSIS — F411 Generalized anxiety disorder: Secondary | ICD-10-CM | POA: Diagnosis not present

## 2020-05-31 DIAGNOSIS — Z8701 Personal history of pneumonia (recurrent): Secondary | ICD-10-CM | POA: Diagnosis not present

## 2020-05-31 DIAGNOSIS — M1990 Unspecified osteoarthritis, unspecified site: Secondary | ICD-10-CM | POA: Diagnosis not present

## 2020-05-31 DIAGNOSIS — Z7401 Bed confinement status: Secondary | ICD-10-CM | POA: Diagnosis not present

## 2020-05-31 DIAGNOSIS — G4700 Insomnia, unspecified: Secondary | ICD-10-CM | POA: Diagnosis not present

## 2020-05-31 DIAGNOSIS — K5730 Diverticulosis of large intestine without perforation or abscess without bleeding: Secondary | ICD-10-CM | POA: Diagnosis not present

## 2020-05-31 DIAGNOSIS — M810 Age-related osteoporosis without current pathological fracture: Secondary | ICD-10-CM | POA: Diagnosis not present

## 2020-05-31 DIAGNOSIS — J449 Chronic obstructive pulmonary disease, unspecified: Secondary | ICD-10-CM | POA: Diagnosis not present

## 2020-05-31 DIAGNOSIS — J4540 Moderate persistent asthma, uncomplicated: Secondary | ICD-10-CM | POA: Diagnosis not present

## 2020-05-31 DIAGNOSIS — F329 Major depressive disorder, single episode, unspecified: Secondary | ICD-10-CM | POA: Diagnosis not present

## 2020-05-31 DIAGNOSIS — I5032 Chronic diastolic (congestive) heart failure: Secondary | ICD-10-CM | POA: Diagnosis not present

## 2020-05-31 DIAGNOSIS — G4733 Obstructive sleep apnea (adult) (pediatric): Secondary | ICD-10-CM | POA: Diagnosis not present

## 2020-05-31 DIAGNOSIS — J9611 Chronic respiratory failure with hypoxia: Secondary | ICD-10-CM | POA: Diagnosis not present

## 2020-05-31 DIAGNOSIS — I130 Hypertensive heart and chronic kidney disease with heart failure and stage 1 through stage 4 chronic kidney disease, or unspecified chronic kidney disease: Secondary | ICD-10-CM | POA: Diagnosis not present

## 2020-05-31 DIAGNOSIS — I739 Peripheral vascular disease, unspecified: Secondary | ICD-10-CM | POA: Diagnosis not present

## 2020-05-31 DIAGNOSIS — M48062 Spinal stenosis, lumbar region with neurogenic claudication: Secondary | ICD-10-CM | POA: Diagnosis not present

## 2020-05-31 DIAGNOSIS — Z6841 Body Mass Index (BMI) 40.0 and over, adult: Secondary | ICD-10-CM | POA: Diagnosis not present

## 2020-05-31 DIAGNOSIS — N189 Chronic kidney disease, unspecified: Secondary | ICD-10-CM | POA: Diagnosis not present

## 2020-05-31 DIAGNOSIS — K219 Gastro-esophageal reflux disease without esophagitis: Secondary | ICD-10-CM | POA: Diagnosis not present

## 2020-05-31 DIAGNOSIS — I872 Venous insufficiency (chronic) (peripheral): Secondary | ICD-10-CM | POA: Diagnosis not present

## 2020-05-31 DIAGNOSIS — Z7982 Long term (current) use of aspirin: Secondary | ICD-10-CM | POA: Diagnosis not present

## 2020-06-02 DIAGNOSIS — I5032 Chronic diastolic (congestive) heart failure: Secondary | ICD-10-CM | POA: Diagnosis not present

## 2020-06-02 DIAGNOSIS — N189 Chronic kidney disease, unspecified: Secondary | ICD-10-CM | POA: Diagnosis not present

## 2020-06-02 DIAGNOSIS — J449 Chronic obstructive pulmonary disease, unspecified: Secondary | ICD-10-CM | POA: Diagnosis not present

## 2020-06-02 DIAGNOSIS — I130 Hypertensive heart and chronic kidney disease with heart failure and stage 1 through stage 4 chronic kidney disease, or unspecified chronic kidney disease: Secondary | ICD-10-CM | POA: Diagnosis not present

## 2020-06-02 DIAGNOSIS — G309 Alzheimer's disease, unspecified: Secondary | ICD-10-CM | POA: Diagnosis not present

## 2020-06-02 DIAGNOSIS — F0280 Dementia in other diseases classified elsewhere without behavioral disturbance: Secondary | ICD-10-CM | POA: Diagnosis not present

## 2020-06-04 DIAGNOSIS — F0280 Dementia in other diseases classified elsewhere without behavioral disturbance: Secondary | ICD-10-CM | POA: Diagnosis not present

## 2020-06-04 DIAGNOSIS — I130 Hypertensive heart and chronic kidney disease with heart failure and stage 1 through stage 4 chronic kidney disease, or unspecified chronic kidney disease: Secondary | ICD-10-CM | POA: Diagnosis not present

## 2020-06-04 DIAGNOSIS — J449 Chronic obstructive pulmonary disease, unspecified: Secondary | ICD-10-CM | POA: Diagnosis not present

## 2020-06-04 DIAGNOSIS — N189 Chronic kidney disease, unspecified: Secondary | ICD-10-CM | POA: Diagnosis not present

## 2020-06-04 DIAGNOSIS — I5032 Chronic diastolic (congestive) heart failure: Secondary | ICD-10-CM | POA: Diagnosis not present

## 2020-06-04 DIAGNOSIS — G309 Alzheimer's disease, unspecified: Secondary | ICD-10-CM | POA: Diagnosis not present

## 2020-06-12 DIAGNOSIS — G309 Alzheimer's disease, unspecified: Secondary | ICD-10-CM | POA: Diagnosis not present

## 2020-06-12 DIAGNOSIS — F0280 Dementia in other diseases classified elsewhere without behavioral disturbance: Secondary | ICD-10-CM | POA: Diagnosis not present

## 2020-06-12 DIAGNOSIS — J449 Chronic obstructive pulmonary disease, unspecified: Secondary | ICD-10-CM | POA: Diagnosis not present

## 2020-06-12 DIAGNOSIS — I5032 Chronic diastolic (congestive) heart failure: Secondary | ICD-10-CM | POA: Diagnosis not present

## 2020-06-12 DIAGNOSIS — N189 Chronic kidney disease, unspecified: Secondary | ICD-10-CM | POA: Diagnosis not present

## 2020-06-12 DIAGNOSIS — I130 Hypertensive heart and chronic kidney disease with heart failure and stage 1 through stage 4 chronic kidney disease, or unspecified chronic kidney disease: Secondary | ICD-10-CM | POA: Diagnosis not present

## 2020-06-16 DIAGNOSIS — F0280 Dementia in other diseases classified elsewhere without behavioral disturbance: Secondary | ICD-10-CM | POA: Diagnosis not present

## 2020-06-16 DIAGNOSIS — I130 Hypertensive heart and chronic kidney disease with heart failure and stage 1 through stage 4 chronic kidney disease, or unspecified chronic kidney disease: Secondary | ICD-10-CM | POA: Diagnosis not present

## 2020-06-16 DIAGNOSIS — J449 Chronic obstructive pulmonary disease, unspecified: Secondary | ICD-10-CM | POA: Diagnosis not present

## 2020-06-16 DIAGNOSIS — G309 Alzheimer's disease, unspecified: Secondary | ICD-10-CM | POA: Diagnosis not present

## 2020-06-16 DIAGNOSIS — N189 Chronic kidney disease, unspecified: Secondary | ICD-10-CM | POA: Diagnosis not present

## 2020-06-16 DIAGNOSIS — I5032 Chronic diastolic (congestive) heart failure: Secondary | ICD-10-CM | POA: Diagnosis not present

## 2020-06-18 ENCOUNTER — Other Ambulatory Visit: Payer: Self-pay | Admitting: Internal Medicine

## 2020-07-05 ENCOUNTER — Other Ambulatory Visit: Payer: Self-pay | Admitting: Internal Medicine

## 2020-08-27 ENCOUNTER — Emergency Department (HOSPITAL_COMMUNITY): Payer: Medicare Other

## 2020-08-27 ENCOUNTER — Encounter (HOSPITAL_COMMUNITY): Payer: Self-pay

## 2020-08-27 ENCOUNTER — Emergency Department (HOSPITAL_COMMUNITY)
Admission: EM | Admit: 2020-08-27 | Discharge: 2020-08-28 | Disposition: A | Discharge status: Home / Self Care | Payer: Medicare Other | Attending: Emergency Medicine | Admitting: Emergency Medicine

## 2020-08-27 DIAGNOSIS — R61 Generalized hyperhidrosis: Secondary | ICD-10-CM | POA: Diagnosis not present

## 2020-08-27 DIAGNOSIS — Z96652 Presence of left artificial knee joint: Secondary | ICD-10-CM | POA: Insufficient documentation

## 2020-08-27 DIAGNOSIS — R55 Syncope and collapse: Secondary | ICD-10-CM | POA: Diagnosis not present

## 2020-08-27 DIAGNOSIS — I130 Hypertensive heart and chronic kidney disease with heart failure and stage 1 through stage 4 chronic kidney disease, or unspecified chronic kidney disease: Secondary | ICD-10-CM | POA: Insufficient documentation

## 2020-08-27 DIAGNOSIS — R109 Unspecified abdominal pain: Secondary | ICD-10-CM | POA: Diagnosis present

## 2020-08-27 DIAGNOSIS — F0280 Dementia in other diseases classified elsewhere without behavioral disturbance: Secondary | ICD-10-CM | POA: Diagnosis not present

## 2020-08-27 DIAGNOSIS — R531 Weakness: Secondary | ICD-10-CM | POA: Diagnosis not present

## 2020-08-27 DIAGNOSIS — E860 Dehydration: Secondary | ICD-10-CM | POA: Insufficient documentation

## 2020-08-27 DIAGNOSIS — Z87891 Personal history of nicotine dependence: Secondary | ICD-10-CM | POA: Diagnosis not present

## 2020-08-27 DIAGNOSIS — Z96641 Presence of right artificial hip joint: Secondary | ICD-10-CM | POA: Insufficient documentation

## 2020-08-27 DIAGNOSIS — J449 Chronic obstructive pulmonary disease, unspecified: Secondary | ICD-10-CM | POA: Insufficient documentation

## 2020-08-27 DIAGNOSIS — Z7982 Long term (current) use of aspirin: Secondary | ICD-10-CM | POA: Diagnosis not present

## 2020-08-27 DIAGNOSIS — T675XXA Heat exhaustion, unspecified, initial encounter: Secondary | ICD-10-CM | POA: Diagnosis not present

## 2020-08-27 DIAGNOSIS — I10 Essential (primary) hypertension: Secondary | ICD-10-CM | POA: Diagnosis not present

## 2020-08-27 DIAGNOSIS — Z743 Need for continuous supervision: Secondary | ICD-10-CM | POA: Diagnosis not present

## 2020-08-27 DIAGNOSIS — N3000 Acute cystitis without hematuria: Secondary | ICD-10-CM | POA: Insufficient documentation

## 2020-08-27 DIAGNOSIS — N189 Chronic kidney disease, unspecified: Secondary | ICD-10-CM | POA: Insufficient documentation

## 2020-08-27 DIAGNOSIS — G309 Alzheimer's disease, unspecified: Secondary | ICD-10-CM | POA: Diagnosis not present

## 2020-08-27 DIAGNOSIS — I5033 Acute on chronic diastolic (congestive) heart failure: Secondary | ICD-10-CM | POA: Insufficient documentation

## 2020-08-27 DIAGNOSIS — J4540 Moderate persistent asthma, uncomplicated: Secondary | ICD-10-CM | POA: Insufficient documentation

## 2020-08-27 DIAGNOSIS — R6889 Other general symptoms and signs: Secondary | ICD-10-CM | POA: Diagnosis not present

## 2020-08-27 DIAGNOSIS — R0902 Hypoxemia: Secondary | ICD-10-CM | POA: Diagnosis not present

## 2020-08-27 DIAGNOSIS — Z79899 Other long term (current) drug therapy: Secondary | ICD-10-CM | POA: Diagnosis not present

## 2020-08-27 LAB — COMPREHENSIVE METABOLIC PANEL
ALT: 14 U/L (ref 0–44)
AST: 16 U/L (ref 15–41)
Albumin: 4.1 g/dL (ref 3.5–5.0)
Alkaline Phosphatase: 59 U/L (ref 38–126)
Anion gap: 10 (ref 5–15)
BUN: 17 mg/dL (ref 8–23)
CO2: 28 mmol/L (ref 22–32)
Calcium: 9.4 mg/dL (ref 8.9–10.3)
Chloride: 103 mmol/L (ref 98–111)
Creatinine, Ser: 1 mg/dL (ref 0.44–1.00)
GFR, Estimated: 56 mL/min — ABNORMAL LOW (ref >60)
Glucose, Bld: 141 mg/dL — ABNORMAL HIGH (ref 70–99)
Potassium: 4.1 mmol/L (ref 3.5–5.1)
Sodium: 141 mmol/L (ref 135–145)
Total Bilirubin: 0.9 mg/dL (ref 0.3–1.2)
Total Protein: 7.3 g/dL (ref 6.5–8.1)

## 2020-08-27 LAB — CBC WITH DIFFERENTIAL/PLATELET
Abs Immature Granulocytes: 0.04 10*3/uL (ref 0.00–0.07)
Basophils Absolute: 0.1 10*3/uL (ref 0.0–0.1)
Basophils Relative: 1 %
Eosinophils Absolute: 0.2 10*3/uL (ref 0.0–0.5)
Eosinophils Relative: 3 %
HCT: 50.2 % — ABNORMAL HIGH (ref 36.0–46.0)
Hemoglobin: 15.4 g/dL — ABNORMAL HIGH (ref 12.0–15.0)
Immature Granulocytes: 1 %
Lymphocytes Relative: 17 %
Lymphs Abs: 1.1 10*3/uL (ref 0.7–4.0)
MCH: 28.8 pg (ref 26.0–34.0)
MCHC: 30.7 g/dL (ref 30.0–36.0)
MCV: 93.8 fL (ref 80.0–100.0)
Monocytes Absolute: 0.5 10*3/uL (ref 0.1–1.0)
Monocytes Relative: 8 %
Neutro Abs: 4.5 10*3/uL (ref 1.7–7.7)
Neutrophils Relative %: 70 %
Platelets: 175 10*3/uL (ref 150–400)
RBC: 5.35 MIL/uL — ABNORMAL HIGH (ref 3.87–5.11)
RDW: 13 % (ref 11.5–15.5)
WBC: 6.4 10*3/uL (ref 4.0–10.5)
nRBC: 0 % (ref 0.0–0.2)

## 2020-08-27 LAB — URINALYSIS, ROUTINE W REFLEX MICROSCOPIC
Glucose, UA: NEGATIVE mg/dL
Hgb urine dipstick: NEGATIVE
Ketones, ur: 5 mg/dL — AB
Nitrite: NEGATIVE
Protein, ur: 30 mg/dL — AB
Specific Gravity, Urine: 1.027 (ref 1.005–1.030)
pH: 5 (ref 5.0–8.0)

## 2020-08-27 LAB — TROPONIN I (HIGH SENSITIVITY)
Troponin I (High Sensitivity): 6 ng/L (ref <18)
Troponin I (High Sensitivity): 4 ng/L (ref <18)

## 2020-08-27 LAB — CBG MONITORING, ED: Glucose-Capillary: 142 mg/dL — ABNORMAL HIGH (ref 70–99)

## 2020-08-27 MED ORDER — SODIUM CHLORIDE 0.9 % IV BOLUS
500.0000 mL | Freq: Once | INTRAVENOUS | Status: AC
  Administered 2020-08-27: 500 mL via INTRAVENOUS

## 2020-08-27 MED ORDER — ONDANSETRON HCL 4 MG/2ML IJ SOLN
4.0000 mg | Freq: Once | INTRAMUSCULAR | Status: AC
  Administered 2020-08-27: 4 mg via INTRAVENOUS
  Filled 2020-08-27: qty 2

## 2020-08-27 MED ORDER — CEPHALEXIN 500 MG PO CAPS: 500.0000 mg | ORAL_CAPSULE | Freq: Two times a day (BID) | ORAL | 0 refills | Status: AC

## 2020-08-27 MED ORDER — SODIUM CHLORIDE 0.9 % IV SOLN
1.0000 g | Freq: Once | INTRAVENOUS | Status: AC
  Administered 2020-08-27: 1 g via INTRAVENOUS
  Filled 2020-08-27: qty 10

## 2020-08-27 NOTE — Discharge Instructions (Addendum)
  Please take all of your antibiotics until finished!   You may develop abdominal discomfort or diarrhea from the antibiotic.  You may help offset this with probiotics which you can buy or get in yogurt. Do not eat or take the probiotics until 2 hours after your antibiotic.   Be sure to stay well-hydrated by drinking plenty of water.  Follow-up with the primary care provider for continued management and repeat assessment.

## 2020-08-27 NOTE — ED Notes (Signed)
Pt on purewick

## 2020-08-27 NOTE — ED Provider Notes (Signed)
[facility] DEPT Provider Note   CSN: [id number] Arrival date & time: 08/27/20  1611     History Chief Complaint  Patient presents with  . Hypertension    Sabrina Page is a 87 y.o. female.  The history is provided by the patient, the EMS personnel and a relative. A language interpreter was used [place]).      Level 5 caveat due to dementia.  Sabrina Page is a 87 y.o. female, with a history of asthma, COPD, obesity, HTN, hyperlipidemia, presenting to the ED via EMS from a assisted living facility.  There was a reported period of time when the patient closed her eyes and would not respond to staff which caused them to call EMS.  Prior to this, patient had been taken outside in the sun.  Upon the daughter's arrival at the bedside, she clarifies some of the story.  She states patient has had decline over the last couple weeks.  She has been less talkative and less oral intake.  This seems to have worsened over the last few days.  She will have times when she will just close her eyes and not speak to anyone.  Patient reports she just feels overall weak and fatigued.  Denies fever/chills, chest pain, shortness of breath, cough, falls/trauma, abdominal pain, urinary symptoms, N/V/D, or any other complaints.  Past Medical History:  Diagnosis Date  . Allergy    rhinitis  . Asthma   . Chronic kidney disease    hx of cystitis   . COPD (chronic obstructive pulmonary disease) ([place])   . Depression   . Dizziness    vertigo   . Gallstones   . GERD (gastroesophageal reflux disease)   . Hyperlipidemia   . Hypertension   . Obesity   . Osteoarthritis   . Osteoporosis   . Peripheral vascular disease ([facility])    swelling   . Pneumonia   . Shortness of breath    with exertion   . Shoulder injury    left  . Sleep apnea    never diagnosed   . Urinary incontinence     Patient Active Problem List   Diagnosis Date Noted  . Acute on  chronic diastolic congestive heart failure ([place]) 11/17/2019  . UTI (urinary tract infection) 05/29/2019  . Urinary incontinence 11/01/2018  . Seborrheic dermatitis 06/14/2018  . Chronic respiratory failure with hypoxia ([place]) 04/14/2018  . Alzheimer disease ([place]) 06/10/2017  . Orthostatic hypotension dysautonomic syndrome 03/20/2017  . Knee pain, right 12/27/2016  . Closed right hip fracture ([place]) 04/15/2016  . Hip fracture ([place]) 04/15/2016  . Syncope due to orthostatic hypotension 01/18/2016  . Orthostatic hypotension 01/18/2016  . Hip pain, chronic 12/25/2015  . Carotid bruit 06/26/2015  . Fall at home 02/01/2015  . Concussion without loss of consciousness 09/13/2014  . Morbid obesity ([place]) 03/06/2014  . Right tennis elbow 01/02/2014  . Wart 01/02/2014  . Chronic fatigue 06/25/2013  . External hemorrhoid, bleeding 02/14/2013  . Obstructive sleep apnea 02/10/2012  . Low back pain radiating to both legs 05/14/2011  . Falls frequently 05/14/2011  . Fatigue 04/22/2011  . Hyperglycemia 04/22/2011  . Neoplasm of uncertain behavior of skin 01/11/2011  . Anxiety state 07/06/2010  . SHOULDER PAIN 05/21/2010  . CONCUSSION WITH LOSS OF CONSCIOUSNESS 05/21/2010  . Actinic keratosis 04/08/2010  . Carpal tunnel syndrome 11/28/2009  . NECK PAIN 11/28/2009  . DIZZINESS 11/28/2009  . INSOMNIA, CHRONIC 06/23/2009  . TOBACCO USE, QUIT 04/11/2009  .  DIVERTICULOSIS OF COLON 09/06/2008  . DIVERTICULITIS OF COLON 09/06/2008  . Unspecified chest pain 09/06/2008  . GERD 06/05/2008  . Osteoarthritis 06/05/2008  . ABNORMAL GLUCOSE NEC 06/05/2008  . BRONCHITIS, ACUTE 04/04/2008  . APHTHOUS STOMATITIS 03/25/2008  . Venous (peripheral) insufficiency 11/03/2007  . Edema 11/03/2007  . SHINGLES 10/23/2007  . DENTAL PAIN 10/23/2007  . PARESTHESIA 10/23/2007  . Pain in limb 07/06/2007  . HYPERLIPIDEMIA 02/17/2007  . Depression 02/17/2007  . Essential hypertension 02/17/2007  . Allergic rhinitis  02/17/2007  . Asthma, moderate persistent, well-controlled 02/17/2007  . Dyspnea on exertion 02/17/2007  . SYMPTOM, INCONTINENCE, URGE 02/17/2007    Past Surgical History:  Procedure Laterality Date  . CHOLECYSTECTOMY  09/14/2011   Procedure: LAPAROSCOPIC CHOLECYSTECTOMY;  Surgeon: Odis Hollingshead, MD;  Location: [facility];  Service: General;  Laterality: N/A;  attempted intraoperative cholangiogram   . HIP ARTHROPLASTY Right 04/17/2016   Procedure: ARTHROPLASTY BIPOLAR HIP (HEMIARTHROPLASTY);  Surgeon: Paralee Cancel, MD;  Location: [facility];  Service: Orthopedics;  Laterality: Right;  . L TKR    . LIVER BIOPSY  09/14/2011   Procedure: LIVER BIOPSY;  Surgeon: Odis Hollingshead, MD;  Location: [facility];  Service: General;  Laterality: N/A;  . OTHER SURGICAL HISTORY     left leg surgery   . OTHER SURGICAL HISTORY     left breast surgery due to mastitis   . right left knee arthroplast       OB History   No obstetric history on file.     Family History  Problem Relation Age of Onset  . Hypertension Mother   . Arthritis Mother   . Diabetes Mother   . Stroke Mother   . Stroke Father   . Hypertension Father   . Hypertension Other   . CAD Neg Hx     Social History   Tobacco Use  . Smoking status: Former Smoker    Packs/day: 0.50    Years: 35.00    Pack years: 17.50    Types: Cigarettes    Quit date: 05/24/2001    Years since quitting: 19.2  . Smokeless tobacco: Never Used  Vaping Use  . Vaping Use: Never used  Substance Use Topics  . Alcohol use: Yes    Comment: socially   . Drug use: No    Home Medications Prior to Admission medications   Medication Sig Start Date End Date Taking? Authorizing Provider  acetaminophen (TYLENOL) 500 MG tablet Take 500 mg by mouth every 6 (six) hours as needed for headache (pain).   Yes [provider]  ARIPiprazole (ABILIFY) 2 MG tablet TAKE 1 TABLET BY MOUTH EVERY DAY 06/18/20  Yes Plotnikov, Evie Lacks, MD  aspirin 81 MG EC tablet  Take 1 tablet (81 mg total) by mouth 2 (two) times daily. 07/19/16  Yes Plotnikov, Evie Lacks, MD  B Complex-C (SUPER B COMPLEX PO) Take 1 tablet by mouth every morning.   Yes [provider]  cephALEXin (KEFLEX) 500 MG capsule Take 1 capsule (500 mg total) by mouth 2 (two) times daily for 7 days. 08/27/20 09/03/20 Yes Sarahy Creedon C, PA-C  Docusate Calcium (STOOL SOFTENER PO) Take 1 capsule by mouth every other day.   Yes [provider]  fludrocortisone (FLORINEF) 0.1 MG tablet TAKE 1 TABLET BY MOUTH EVERY DAY Patient taking differently: Take 0.1 mg by mouth daily. 08/23/19  Yes Plotnikov, Evie Lacks, MD  FLUoxetine (PROZAC) 40 MG capsule TAKE 1 CAPSULE BY MOUTH EVERY DAY 07/07/20  Yes Plotnikov, Evie Lacks, MD  furosemide (LASIX) 40 MG tablet TAKE 1 TABLET (40 MG TOTAL) BY MOUTH DAILY AS NEEDED FOR EDEMA. Patient taking differently: Take 20 mg by mouth daily as needed for fluid or edema. 02/16/20 02/15/21 Yes Plotnikov, Evie Lacks, MD  loratadine (CLARITIN) 10 MG tablet TAKE 1 TABLET BY MOUTH EVERY DAY IN THE MORNING 05/05/20  Yes Plotnikov, Evie Lacks, MD  memantine (NAMENDA) 10 MG tablet TAKE 1 TABLET BY MOUTH TWICE A DAY Patient taking differently: Take 10 mg by mouth 2 (two) times daily. 08/27/19  Yes Plotnikov, Evie Lacks, MD  midodrine (PROAMATINE) 10 MG tablet Take 0.5 tablets (5 mg total) by mouth in the morning and at bedtime. 09/14/19  Yes Patel, Donika K, DO  Multiple Vitamins-Minerals (ZINC PO) Take 1 tablet by mouth daily.   Yes [provider]  nystatin cream (MYCOSTATIN) Apply 1 application topically daily as needed for dry skin (rash/irritation/itching).   Yes [provider]  Omega-3 Fatty Acids (FISH OIL) 1000 MG CAPS Take 1,000 mg by mouth every morning.   Yes [provider]  omeprazole (PRILOSEC) 20 MG capsule TAKE 2 CAPSULES BY MOUTH EVERY DAY 07/07/20  Yes Plotnikov, Evie Lacks, MD  OVER THE COUNTER MEDICATION Apply 1 application topically daily as  needed (knee pain). Hemp Freeze   Yes [provider]  potassium chloride (KLOR-CON) 8 MEQ tablet TAKE 1 TABLET (8 MEQ TOTAL) BY MOUTH EVERY MORNING. 07/07/20  Yes Plotnikov, Evie Lacks, MD  pyridostigmine (MESTINON) 60 MG tablet TAKE 1 TABLET (60 MG TOTAL) BY MOUTH 3 (THREE) TIMES DAILY. Patient taking differently: Take 60 mg by mouth 3 (three) times daily after meals. 08/23/19  Yes Patel, Donika K, DO  traMADol (ULTRAM) 50 MG tablet Take 1 tablet (50 mg total) by mouth every 12 (twelve) hours as needed for severe pain. 09/20/17  Yes Plotnikov, Evie Lacks, MD  vitamin C (ASCORBIC ACID) 500 MG tablet Take 500 mg by mouth daily.   Yes [provider]  VITAMIN D-VITAMIN K PO Take 1 tablet by mouth daily. D 5000 units   Yes [provider]  ipratropium-albuterol (DUONEB) 0.5-2.5 (3) MG/3ML SOLN 1 neb every 6 hours if needed Patient not taking: No sig reported 04/13/18   Baird Lyons D, MD  polyethylene glycol powder (GLYCOLAX/MIRALAX) 17 GM/SCOOP powder Take 17 g by mouth 2 (two) times daily as needed for moderate constipation or severe constipation. Patient not taking: No sig reported 03/04/20   Biagio Borg, MD    Allergies    Atorvastatin, Enalapril maleate, Hctz [hydrochlorothiazide], Lovastatin, Namenda [memantine hcl], Simvastatin, and Topamax [topiramate]  Review of Systems   Review of Systems  Constitutional: Positive for fatigue. Negative for chills, diaphoresis and fever.  Respiratory: Negative for cough and shortness of breath.   Cardiovascular: Negative for chest pain and leg swelling.  Gastrointestinal: Negative for abdominal pain, diarrhea, nausea and vomiting.  Genitourinary: Negative for dysuria.  Neurological: Positive for weakness. Negative for dizziness and syncope.    Physical Exam Updated Vital Signs BP 128/72   Pulse 72   Temp 98.3 F (36.8 C) (Oral)   Resp 14   Ht 5\' 4"  (1.626 m)   Wt 113.4 kg   SpO2 90%   BMI 42.91 kg/m   Physical  Exam Vitals and nursing note reviewed.  Constitutional:      General: She is not in acute distress.    Appearance: She is well-developed. She is obese. She is not diaphoretic.  HENT:     Head: Normocephalic and atraumatic.     Mouth/Throat:     Mouth: Mucous membranes are moist.     Pharynx: Oropharynx is clear.  Eyes:     Conjunctiva/sclera: Conjunctivae normal.  Cardiovascular:     Rate and Rhythm: Normal rate and regular rhythm.     Pulses: Normal pulses.          Radial pulses are 2+ on the right side and 2+ on the left side.       Posterior tibial pulses are 2+ on the right side and 2+ on the left side.     Heart sounds: Normal heart sounds.     Comments: Tactile temperature in the extremities appropriate and equal bilaterally. Pulmonary:     Effort: Pulmonary effort is normal. No respiratory distress.     Breath sounds: Normal breath sounds.  Abdominal:     Palpations: Abdomen is soft.     Tenderness: There is no abdominal tenderness. There is no guarding.  Musculoskeletal:     Cervical back: Neck supple.     Right lower leg: No edema.     Left lower leg: No edema.  Lymphadenopathy:     Cervical: No cervical adenopathy.  Skin:    General: Skin is warm and dry.  Neurological:     Mental Status: She is alert.     Comments: No noted acute cognitive deficit. Sensation grossly intact to light touch in the extremities.   Grip strengths equal bilaterally.   Strength 5/5 in all extremities.  Cranial nerves III-XII grossly intact.  Handles oral secretions without noted difficulty.  No noted phonation or speech deficit. No facial droop.   Psychiatric:        Mood and Affect: Mood and affect normal.        Speech: Speech normal.        Behavior: Behavior normal.     ED Results / Procedures / Treatments   Labs (all labs ordered are listed, but only abnormal results are displayed) Labs Reviewed  URINALYSIS, ROUTINE W REFLEX MICROSCOPIC - Abnormal; Notable for the  following components:      Result Value   Color, Urine AMBER (*)    APPearance HAZY (*)    Bilirubin Urine SMALL (*)    Ketones, ur 5 (*)    Protein, ur 30 (*)    Leukocytes,Ua MODERATE (*)    Bacteria, UA MANY (*)    All other components within normal limits  COMPREHENSIVE METABOLIC PANEL - Abnormal; Notable for the following components:   Glucose, Bld 141 (*)    GFR, Estimated 56 (*)    All other components within normal limits  CBC WITH DIFFERENTIAL/PLATELET - Abnormal; Notable for the following components:   RBC 5.35 (*)    Hemoglobin 15.4 (*)    HCT 50.2 (*)    All other components within normal limits  CBG MONITORING, ED - Abnormal; Notable for the following components:   Glucose-Capillary 142 (*)    All other components within normal limits  URINE CULTURE  TROPONIN I (HIGH SENSITIVITY)  TROPONIN I (HIGH SENSITIVITY)    EKG EKG Interpretation  Date/Time:  Wednesday August 27 2020 16:23:36 EDT Ventricular Rate:  65 PR Interval:  181 QRS Duration: 97 QT Interval:  435 QTC Calculation: 453 R Axis:   -6 Text Interpretation: Sinus rhythm Abnormal R-wave progression, early transition Left ventricular hypertrophy No significant change since last tracing Confirmed by Wandra Arthurs [phone]) on 08/28/2020  1:40:14 PM   Radiology DG Chest Portable 1 View  Result Date: 08/27/2020 CLINICAL DATA:  Near syncope EXAM: PORTABLE CHEST 1 VIEW COMPARISON:  12/28/2017 FINDINGS: Heart and mediastinal contours are within normal limits. No focal opacities or effusions. No acute bony abnormality. IMPRESSION: No active disease. Electronically Signed   By: Rolm Baptise M.D.   On: 08/27/2020 17:49    Procedures Procedures   Medications Ordered in ED Medications  ondansetron (ZOFRAN) injection 4 mg (4 mg Intravenous Given 08/27/20 1656)  sodium chloride 0.9 % bolus 500 mL (0 mLs Intravenous Stopped 08/27/20 2007)  cefTRIAXone (ROCEPHIN) 1 g in sodium chloride 0.9 % 100 mL IVPB (0 g Intravenous  Stopped 08/27/20 2307)    ED Course  I have reviewed the triage vital signs and the nursing notes.  Pertinent labs & imaging results that were available during my care of the patient were reviewed by me and considered in my medical decision making (see chart for details).    MDM Rules/Calculators/A&P                          Patient presents with reported generalized weakness. Too much time in the sun versus dehydration are considerations. Patient is nontoxic appearing, afebrile, not tachycardic, not tachypneic, not hypotensive, maintains excellent SPO2 on room air, and is in no apparent distress.   I have reviewed the patient's chart to obtain more information.   I reviewed and interpreted the patient's labs and radiological studies. No leukocytosis.  No acute anemia.  Delta troponins negative. UA with evidence of UTI.  Urine culture sent.  Patient and her daughter were given instructions for home care as well as return precautions.  Both parties voice understanding of these instructions, accept the plan, and are comfortable with discharge.  Findings and plan of care discussed with attending physician, Varney Biles, MD. Dr. Kathrynn Humble personally evaluated and examined this patient.  Vitals:   08/28/20 0130 08/28/20 0200 08/28/20 0245 08/28/20 0300  BP: 134/70 133/72 130/67 131/73  Pulse: 67 64 64 67  Resp: 16 18 14 17   Temp:      TempSrc:      SpO2: 94% 92% 90% 90%  Weight:      Height:        Final Clinical Impression(s) / ED Diagnoses Final diagnoses:  Acute cystitis without hematuria    Rx / DC Orders ED Discharge Orders         Ordered    cephALEXin (KEFLEX) 500 MG capsule  2 times daily        08/27/20 2051           Lorayne Bender, PA-C 08/28/20 2251    Varney Biles, MD 08/29/20 1601

## 2020-08-27 NOTE — ED Triage Notes (Signed)
Pt presents to the ED via EMS from her assisted living facility for hypertension. Per EMS, the pt's nursing facility her BP was initially 160/110 then dropped to 289 systolic upon standing. En route pt's BP was 132/78. Per EMS, pt speaks [place]. EMS spoke with pt's daughter who states these BP's are "normal."

## 2020-08-27 NOTE — ED Notes (Signed)
PTAR request completed 

## 2020-08-28 DIAGNOSIS — Z743 Need for continuous supervision: Secondary | ICD-10-CM | POA: Diagnosis not present

## 2020-08-28 DIAGNOSIS — R0902 Hypoxemia: Secondary | ICD-10-CM | POA: Diagnosis not present

## 2020-08-28 DIAGNOSIS — R279 Unspecified lack of coordination: Secondary | ICD-10-CM | POA: Diagnosis not present

## 2020-08-28 NOTE — ED Notes (Signed)
Patient is resting comfortably. 

## 2020-08-28 NOTE — ED Notes (Signed)
Pt and her daughter verbalized understanding of d/c, medication, and follow up care. Daughter provided translation.

## 2020-08-30 LAB — URINE CULTURE: Culture: >=100000 — AB

## 2020-08-31 ENCOUNTER — Telehealth: Payer: Self-pay | Admitting: Emergency Medicine

## 2020-08-31 NOTE — Telephone Encounter (Signed)
Post ED Visit - Positive Culture Follow-up: Successful Patient Follow-Up  Culture assessed and recommendations reviewed by:  []  Elenor Quinones, Pharm.D. []  Heide Guile, Pharm.D., BCPS AQ-ID []  Parks Neptune, Pharm.D., BCPS []  Alycia Rossetti, Pharm.D., BCPS []  Olivia Lopez de Gutierrez, [place].D., BCPS, AAHIVP []  Legrand Como, Pharm.D., BCPS, AAHIVP []  Salome Arnt, PharmD, BCPS []  Johnnette Gourd, PharmD, BCPS []  Hughes Better, PharmD, BCPS [x]  Samul Dada, PharmD  Positive urine culture  []  Patient discharged without antimicrobial prescription and treatment is now indicated [x]  Organism is resistant to prescribed ED discharge antimicrobial []  Patient with positive blood cultures  Changes discussed with ED provider: Sherol Dade, PA New antibiotic prescription: Amoxicillin 500 mg PO TID x five days [facility] [address] [phone])  Contacted patient's daughter [name], date 08/31/2020, time [place] 08/31/2020, 3:57 PM

## 2020-08-31 NOTE — Progress Notes (Signed)
ED Antimicrobial Stewardship Positive Culture Follow Up   Sabrina Page is an 87 y.o. female who presented to [facility] on 08/27/2020 with a chief complaint of  Chief Complaint  Patient presents with  . Hypertension    Recent Results (from the past 720 hour(s))  Urine culture     Status: Abnormal   Collection Time: 08/27/20  5:00 PM   Specimen: Urine, Clean Catch  Result Value Ref Range Status   Specimen Description   Final    URINE, CLEAN CATCH Performed at [facility], [place] [address] [place], [place] [address]    Special Requests   Final    NONE Performed at [facility], [place] [address] [place], [place] [address]    Culture (A)  Final    >=100,000 COLONIES/mL ENTEROCOCCUS FAECALIS 70,000 COLONIES/mL ESCHERICHIA COLI    Report Status 08/30/2020 FINAL  Final   Organism ID, Bacteria ENTEROCOCCUS FAECALIS (A)  Final   Organism ID, Bacteria ESCHERICHIA COLI (A)  Final      Susceptibility   Escherichia coli - MIC*    AMPICILLIN 4 SENSITIVE Sensitive     CEFAZOLIN <=4 SENSITIVE Sensitive     CEFEPIME <=0.12 SENSITIVE Sensitive     CEFTRIAXONE <=0.25 SENSITIVE Sensitive     CIPROFLOXACIN <=0.25 SENSITIVE Sensitive     GENTAMICIN <=1 SENSITIVE Sensitive     IMIPENEM <=0.25 SENSITIVE Sensitive     NITROFURANTOIN <=16 SENSITIVE Sensitive     TRIMETH/SULFA <=20 SENSITIVE Sensitive     AMPICILLIN/SULBACTAM <=2 SENSITIVE Sensitive     PIP/TAZO <=4 SENSITIVE Sensitive     * 70,000 COLONIES/mL ESCHERICHIA COLI   Enterococcus faecalis - MIC*    AMPICILLIN <=2 SENSITIVE Sensitive     NITROFURANTOIN <=16 SENSITIVE Sensitive     VANCOMYCIN 1 SENSITIVE Sensitive     * >=100,000 COLONIES/mL ENTEROCOCCUS FAECALIS    [x]  Treated with cephalexin, organism resistant to prescribed antimicrobial  E.coli sensitive to cephalexin, however, enterococcus not covered by cephalosporins.   Plan: Stop taking cephalexin, start taking amoxicillin  (covers both E.coli and enterococcus)  New antibiotic prescription: Amoxicillin 500 mg PO TID x 5 days. No refills.  ED Provider: Sherol Dade, PA-C   Lenis Noon, PharmD 08/31/2020, 1:07 PM Clinical Pharmacist [phone]

## 2020-09-08 ENCOUNTER — Other Ambulatory Visit: Payer: Self-pay | Admitting: Neurology

## 2020-09-30 ENCOUNTER — Other Ambulatory Visit: Payer: Self-pay

## 2020-09-30 ENCOUNTER — Telehealth (INDEPENDENT_AMBULATORY_CARE_PROVIDER_SITE_OTHER): Payer: Medicare Other | Admitting: Internal Medicine

## 2020-09-30 DIAGNOSIS — R296 Repeated falls: Secondary | ICD-10-CM | POA: Diagnosis not present

## 2020-09-30 DIAGNOSIS — F0280 Dementia in other diseases classified elsewhere without behavioral disturbance: Secondary | ICD-10-CM

## 2020-09-30 DIAGNOSIS — M79605 Pain in left leg: Secondary | ICD-10-CM

## 2020-09-30 DIAGNOSIS — G309 Alzheimer's disease, unspecified: Secondary | ICD-10-CM

## 2020-09-30 DIAGNOSIS — R531 Weakness: Secondary | ICD-10-CM | POA: Diagnosis not present

## 2020-09-30 DIAGNOSIS — M545 Low back pain, unspecified: Secondary | ICD-10-CM

## 2020-09-30 DIAGNOSIS — M79604 Pain in right leg: Secondary | ICD-10-CM

## 2020-09-30 MED ORDER — AMOXICILLIN 500 MG PO CAPS: 500.0000 mg | ORAL_CAPSULE | Freq: Three times a day (TID) | ORAL | 2 refills | Status: DC

## 2020-09-30 NOTE — Progress Notes (Signed)
Virtual Visit via Video Note  I connected with Sabrina Page on 09/30/20 at  8:10 AM EDT by a video enabled telemedicine application and verified that I am speaking with the correct person using two identifiers.   I discussed the limitations of evaluation and management by telemedicine and the availability of in person appointments. The patient expressed understanding and agreed to proceed.  I was located at our [facility] office. The patient was at home. Dtr Sabrina Page present in the visit.   History of Present Illness: We need to follow-up on dementia, weakness, recurrent UTIs, poor balance, near syncope.  The patient is wheelchair-bound.  They are unable to leave home.  They will need a Hoyer lift     Observations/Objective: The patient appears to be in no acute distress, looks OK.  She is in the wheelchair  Assessment and Plan:  See my Assessment and Plan. Follow Up Instructions:    I discussed the assessment and treatment plan with the patient. The patient was provided an opportunity to ask questions and all were answered. The patient agreed with the plan and demonstrated an understanding of the instructions.   The patient was advised to call back or seek an in-person evaluation if the symptoms worsen or if the condition fails to improve as anticipated.  I provided face-to-face time during this encounter. We were at different locations.   [name], MD

## 2020-10-01 ENCOUNTER — Other Ambulatory Visit: Payer: Self-pay | Admitting: Internal Medicine

## 2020-10-04 ENCOUNTER — Other Ambulatory Visit: Payer: Self-pay | Admitting: Neurology

## 2020-10-05 NOTE — Assessment & Plan Note (Signed)
Worse.  PT, OT at home

## 2020-10-05 NOTE — Assessment & Plan Note (Signed)
Severe spinal stenosis. PT/OT at home

## 2020-10-05 NOTE — Assessment & Plan Note (Addendum)
Progressive symptoms.  Failure to thrive.  Home care consult Continue on Namenda

## 2020-10-05 NOTE — Assessment & Plan Note (Signed)
Multifactorial weakness.  Failure to thrive.  Palliative care consult at home.

## 2020-10-07 ENCOUNTER — Telehealth: Payer: Self-pay | Admitting: Neurology

## 2020-10-07 ENCOUNTER — Telehealth: Payer: Self-pay

## 2020-10-07 ENCOUNTER — Other Ambulatory Visit: Payer: Self-pay | Admitting: *Deleted

## 2020-10-07 MED ORDER — PYRIDOSTIGMINE BROMIDE 60 MG PO TABS
60.0000 mg | ORAL_TABLET | Freq: Three times a day (TID) | ORAL | 3 refills | Status: DC
Start: 2020-10-07 — End: 2021-01-06

## 2020-10-07 NOTE — Telephone Encounter (Signed)
Attempted to contact patient/family to schedule a Palliative Care consult appointment. No answer left a message to return call.

## 2020-10-07 NOTE — Telephone Encounter (Signed)
   1. Which medications need to be refilled? (please list name of each medication and dose if known) pyridostigmine, 60mg   2. Which pharmacy/location (including street and city if local pharmacy) is medication to be sent to[facility] [address]  3. Do they need a 30 day or 90 day supply? 90 days, if possible

## 2020-10-07 NOTE — Telephone Encounter (Signed)
Sent Rx pyridostigmine (MESTINON) [facility]

## 2020-10-13 ENCOUNTER — Telehealth: Payer: Self-pay

## 2020-10-13 NOTE — Telephone Encounter (Signed)
Spoke with patient's daughter [name] and scheduled an in-person Palliative Consult for 11/06/20 @ 9AM.  COVID screening was negative. No pets in home. Patient lives with husband. Patient has 24/7 caregivers.  Consent obtained; updated Outlook/Netsmart/Team List and Epic.   Family is aware they may be receiving a call from NP the day before or day of to confirm appointment.

## 2020-10-18 DIAGNOSIS — J449 Chronic obstructive pulmonary disease, unspecified: Secondary | ICD-10-CM | POA: Diagnosis not present

## 2020-10-18 DIAGNOSIS — J9611 Chronic respiratory failure with hypoxia: Secondary | ICD-10-CM | POA: Diagnosis not present

## 2020-10-18 DIAGNOSIS — Z792 Long term (current) use of antibiotics: Secondary | ICD-10-CM | POA: Diagnosis not present

## 2020-10-18 DIAGNOSIS — G4733 Obstructive sleep apnea (adult) (pediatric): Secondary | ICD-10-CM | POA: Diagnosis not present

## 2020-10-18 DIAGNOSIS — Z9181 History of falling: Secondary | ICD-10-CM | POA: Diagnosis not present

## 2020-10-18 DIAGNOSIS — G5600 Carpal tunnel syndrome, unspecified upper limb: Secondary | ICD-10-CM | POA: Diagnosis not present

## 2020-10-18 DIAGNOSIS — Z7982 Long term (current) use of aspirin: Secondary | ICD-10-CM | POA: Diagnosis not present

## 2020-10-18 DIAGNOSIS — I5032 Chronic diastolic (congestive) heart failure: Secondary | ICD-10-CM | POA: Diagnosis not present

## 2020-10-18 DIAGNOSIS — F0280 Dementia in other diseases classified elsewhere without behavioral disturbance: Secondary | ICD-10-CM | POA: Diagnosis not present

## 2020-10-18 DIAGNOSIS — G309 Alzheimer's disease, unspecified: Secondary | ICD-10-CM | POA: Diagnosis not present

## 2020-10-18 DIAGNOSIS — I872 Venous insufficiency (chronic) (peripheral): Secondary | ICD-10-CM | POA: Diagnosis not present

## 2020-10-18 DIAGNOSIS — K219 Gastro-esophageal reflux disease without esophagitis: Secondary | ICD-10-CM | POA: Diagnosis not present

## 2020-10-18 DIAGNOSIS — I951 Orthostatic hypotension: Secondary | ICD-10-CM | POA: Diagnosis not present

## 2020-10-18 DIAGNOSIS — E1151 Type 2 diabetes mellitus with diabetic peripheral angiopathy without gangrene: Secondary | ICD-10-CM | POA: Diagnosis not present

## 2020-10-18 DIAGNOSIS — Z87891 Personal history of nicotine dependence: Secondary | ICD-10-CM | POA: Diagnosis not present

## 2020-10-18 DIAGNOSIS — Z96653 Presence of artificial knee joint, bilateral: Secondary | ICD-10-CM | POA: Diagnosis not present

## 2020-10-18 DIAGNOSIS — F32A Depression, unspecified: Secondary | ICD-10-CM | POA: Diagnosis not present

## 2020-10-18 DIAGNOSIS — E785 Hyperlipidemia, unspecified: Secondary | ICD-10-CM | POA: Diagnosis not present

## 2020-10-18 DIAGNOSIS — Z96641 Presence of right artificial hip joint: Secondary | ICD-10-CM | POA: Diagnosis not present

## 2020-10-18 DIAGNOSIS — N3000 Acute cystitis without hematuria: Secondary | ICD-10-CM | POA: Diagnosis not present

## 2020-10-18 DIAGNOSIS — I110 Hypertensive heart disease with heart failure: Secondary | ICD-10-CM | POA: Diagnosis not present

## 2020-10-21 DIAGNOSIS — F0280 Dementia in other diseases classified elsewhere without behavioral disturbance: Secondary | ICD-10-CM | POA: Diagnosis not present

## 2020-10-21 DIAGNOSIS — N3000 Acute cystitis without hematuria: Secondary | ICD-10-CM | POA: Diagnosis not present

## 2020-10-21 DIAGNOSIS — I110 Hypertensive heart disease with heart failure: Secondary | ICD-10-CM | POA: Diagnosis not present

## 2020-10-21 DIAGNOSIS — I5032 Chronic diastolic (congestive) heart failure: Secondary | ICD-10-CM | POA: Diagnosis not present

## 2020-10-21 DIAGNOSIS — G309 Alzheimer's disease, unspecified: Secondary | ICD-10-CM | POA: Diagnosis not present

## 2020-10-21 DIAGNOSIS — J449 Chronic obstructive pulmonary disease, unspecified: Secondary | ICD-10-CM | POA: Diagnosis not present

## 2020-10-22 DIAGNOSIS — G309 Alzheimer's disease, unspecified: Secondary | ICD-10-CM | POA: Diagnosis not present

## 2020-10-22 DIAGNOSIS — I110 Hypertensive heart disease with heart failure: Secondary | ICD-10-CM | POA: Diagnosis not present

## 2020-10-22 DIAGNOSIS — J449 Chronic obstructive pulmonary disease, unspecified: Secondary | ICD-10-CM | POA: Diagnosis not present

## 2020-10-22 DIAGNOSIS — I5032 Chronic diastolic (congestive) heart failure: Secondary | ICD-10-CM | POA: Diagnosis not present

## 2020-10-22 DIAGNOSIS — F0280 Dementia in other diseases classified elsewhere without behavioral disturbance: Secondary | ICD-10-CM | POA: Diagnosis not present

## 2020-10-22 DIAGNOSIS — N3000 Acute cystitis without hematuria: Secondary | ICD-10-CM | POA: Diagnosis not present

## 2020-10-25 DIAGNOSIS — G309 Alzheimer's disease, unspecified: Secondary | ICD-10-CM | POA: Diagnosis not present

## 2020-10-25 DIAGNOSIS — I5032 Chronic diastolic (congestive) heart failure: Secondary | ICD-10-CM | POA: Diagnosis not present

## 2020-10-25 DIAGNOSIS — I110 Hypertensive heart disease with heart failure: Secondary | ICD-10-CM | POA: Diagnosis not present

## 2020-10-25 DIAGNOSIS — N3000 Acute cystitis without hematuria: Secondary | ICD-10-CM | POA: Diagnosis not present

## 2020-10-25 DIAGNOSIS — F0280 Dementia in other diseases classified elsewhere without behavioral disturbance: Secondary | ICD-10-CM | POA: Diagnosis not present

## 2020-10-25 DIAGNOSIS — J449 Chronic obstructive pulmonary disease, unspecified: Secondary | ICD-10-CM | POA: Diagnosis not present

## 2020-10-27 ENCOUNTER — Telehealth: Payer: Self-pay | Admitting: Internal Medicine

## 2020-10-27 DIAGNOSIS — F0280 Dementia in other diseases classified elsewhere without behavioral disturbance: Secondary | ICD-10-CM | POA: Diagnosis not present

## 2020-10-27 DIAGNOSIS — N3000 Acute cystitis without hematuria: Secondary | ICD-10-CM | POA: Diagnosis not present

## 2020-10-27 DIAGNOSIS — I110 Hypertensive heart disease with heart failure: Secondary | ICD-10-CM | POA: Diagnosis not present

## 2020-10-27 DIAGNOSIS — I5032 Chronic diastolic (congestive) heart failure: Secondary | ICD-10-CM | POA: Diagnosis not present

## 2020-10-27 DIAGNOSIS — G309 Alzheimer's disease, unspecified: Secondary | ICD-10-CM | POA: Diagnosis not present

## 2020-10-27 DIAGNOSIS — J449 Chronic obstructive pulmonary disease, unspecified: Secondary | ICD-10-CM | POA: Diagnosis not present

## 2020-10-27 NOTE — Telephone Encounter (Signed)
Seeking Advice  Sabrina Page from [facility] calling to report patient  Has +4 pitting edema both ankles BP 145/75 Somewhat sluggish/ lethargic (no more than normal) Sabrina Page reports the issues seem to be related to CHF however would like advice   Please call caregiver Sabrina Page at [place] [facility] [phone]

## 2020-10-31 DIAGNOSIS — J9611 Chronic respiratory failure with hypoxia: Secondary | ICD-10-CM | POA: Diagnosis not present

## 2020-10-31 DIAGNOSIS — E1151 Type 2 diabetes mellitus with diabetic peripheral angiopathy without gangrene: Secondary | ICD-10-CM | POA: Diagnosis not present

## 2020-10-31 DIAGNOSIS — N3000 Acute cystitis without hematuria: Secondary | ICD-10-CM | POA: Diagnosis not present

## 2020-10-31 DIAGNOSIS — I110 Hypertensive heart disease with heart failure: Secondary | ICD-10-CM | POA: Diagnosis not present

## 2020-10-31 DIAGNOSIS — G5600 Carpal tunnel syndrome, unspecified upper limb: Secondary | ICD-10-CM

## 2020-10-31 DIAGNOSIS — J449 Chronic obstructive pulmonary disease, unspecified: Secondary | ICD-10-CM | POA: Diagnosis not present

## 2020-10-31 DIAGNOSIS — F32A Depression, unspecified: Secondary | ICD-10-CM | POA: Diagnosis not present

## 2020-10-31 DIAGNOSIS — G4733 Obstructive sleep apnea (adult) (pediatric): Secondary | ICD-10-CM | POA: Diagnosis not present

## 2020-10-31 DIAGNOSIS — E785 Hyperlipidemia, unspecified: Secondary | ICD-10-CM

## 2020-10-31 DIAGNOSIS — Z96653 Presence of artificial knee joint, bilateral: Secondary | ICD-10-CM

## 2020-10-31 DIAGNOSIS — Z792 Long term (current) use of antibiotics: Secondary | ICD-10-CM

## 2020-10-31 DIAGNOSIS — Z87891 Personal history of nicotine dependence: Secondary | ICD-10-CM

## 2020-10-31 DIAGNOSIS — Z7982 Long term (current) use of aspirin: Secondary | ICD-10-CM

## 2020-10-31 DIAGNOSIS — I5032 Chronic diastolic (congestive) heart failure: Secondary | ICD-10-CM | POA: Diagnosis not present

## 2020-10-31 DIAGNOSIS — I951 Orthostatic hypotension: Secondary | ICD-10-CM | POA: Diagnosis not present

## 2020-10-31 DIAGNOSIS — F0280 Dementia in other diseases classified elsewhere without behavioral disturbance: Secondary | ICD-10-CM | POA: Diagnosis not present

## 2020-10-31 DIAGNOSIS — K219 Gastro-esophageal reflux disease without esophagitis: Secondary | ICD-10-CM

## 2020-10-31 DIAGNOSIS — Z96641 Presence of right artificial hip joint: Secondary | ICD-10-CM

## 2020-10-31 DIAGNOSIS — I872 Venous insufficiency (chronic) (peripheral): Secondary | ICD-10-CM | POA: Diagnosis not present

## 2020-10-31 DIAGNOSIS — G309 Alzheimer's disease, unspecified: Secondary | ICD-10-CM | POA: Diagnosis not present

## 2020-10-31 DIAGNOSIS — Z9181 History of falling: Secondary | ICD-10-CM

## 2020-11-03 DIAGNOSIS — J449 Chronic obstructive pulmonary disease, unspecified: Secondary | ICD-10-CM | POA: Diagnosis not present

## 2020-11-03 DIAGNOSIS — G309 Alzheimer's disease, unspecified: Secondary | ICD-10-CM | POA: Diagnosis not present

## 2020-11-03 DIAGNOSIS — I5032 Chronic diastolic (congestive) heart failure: Secondary | ICD-10-CM | POA: Diagnosis not present

## 2020-11-03 DIAGNOSIS — F0280 Dementia in other diseases classified elsewhere without behavioral disturbance: Secondary | ICD-10-CM | POA: Diagnosis not present

## 2020-11-03 DIAGNOSIS — N3000 Acute cystitis without hematuria: Secondary | ICD-10-CM | POA: Diagnosis not present

## 2020-11-03 DIAGNOSIS — I110 Hypertensive heart disease with heart failure: Secondary | ICD-10-CM | POA: Diagnosis not present

## 2020-11-03 NOTE — Telephone Encounter (Signed)
They can try to increase Lasix to 40-80 mg every morning.  Hope that helps.  Thanks

## 2020-11-04 NOTE — Telephone Encounter (Signed)
Called and spoke with [facility] (caregiver) in regards to the pt increasing lasix. Glenna verbalized understanding.

## 2020-11-06 ENCOUNTER — Other Ambulatory Visit: Payer: Self-pay

## 2020-11-06 ENCOUNTER — Other Ambulatory Visit: Payer: Medicare Other | Admitting: Hospice

## 2020-11-06 DIAGNOSIS — I5033 Acute on chronic diastolic (congestive) heart failure: Secondary | ICD-10-CM | POA: Diagnosis not present

## 2020-11-06 DIAGNOSIS — F0390 Unspecified dementia without behavioral disturbance: Secondary | ICD-10-CM

## 2020-11-06 DIAGNOSIS — Z515 Encounter for palliative care: Secondary | ICD-10-CM

## 2020-11-06 NOTE — Progress Notes (Signed)
Therapist, nutritional Palliative Care Consult Note Telephone: [phone]  Fax: [phone]  PATIENT NAME: Sabrina Page [address] [place] [phone] (home)  DOB: 12/22/1933 MRN: [id number]  PRIMARY CARE PROVIDER:    Tresa Garter, MD,  [address] [place] [address] [phone]  REFERRING PROVIDER:   Tresa Garter, MD [address] [place],  [place] [address] [phone]  RESPONSIBLE PARTY:   [facility] -  Physical therapist, best to call Contact Information     Name Relation Home Work Mobile   Sokolsky,[name] Daughter [phone]        Crystal: [phone] - caregiver, next to call Natalia - [phone] - caregiver  I met face to face with patient and family at home. Palliative Care was asked to follow this patient by consultation request of  Plotnikov, Georgina Quint, MD to address advance care planning, complex medical decision making and goals of care clarification. Spouse and caregivers present during visit.  [name] joined visit through telephonic.  This is the initial visit.    ASSESSMENT AND / RECOMMENDATIONS:   Advance Care Planning: Our advance care planning conversation included a discussion about:    The value and importance of advance care planning  Difference between Hospice and Palliative care Exploration of goals of care in the event of a sudden injury or illness  Identification and preparation of a healthcare agent  Review and updating or creation of an  advance directive document . Decision not to resuscitate or to de-escalate disease focused treatments due to poor prognosis.  CODE STATUS: Discussion on implications and ramifications of code [name] elected DO NOT RESUSCITATE NP signed DNR for patient to keep at home; same document uploaded to epic today.  Goals of Care: Goals include to maximize quality of life and symptom management  I spent 46 minutes providing  this initial consultation. More than 50% of the time in this consultation was spent on counseling patient and coordinating communication. --------------------------------------------------------------------------------------------------------------------------------------  Symptom Management/Plan: Dementia: Advanced. FAST 7b. Total care, uses stander for transfers. Continue ongoing supportive care.  Weakness: PT/OT is ongoing.  CHF: Managed with Lasix and Potassium. Routine CBC BMP. Education provided on no added salt Obesity: Portion control and caloric intake discussed with [name] and caregivers. ST consult as needed. Routine CBC  BMP Follow up: Palliative care will continue to follow for complex medical decision making, advance care planning, and clarification of goals. Return 6 weeks or prn.Encouraged to call provider sooner with any concerns.   Family /Caregiver/Community Supports: Patient lives at home with spouse. Caregivers from [facility]  PPS: 30%  HOSPICE ELIGIBILITY/DIAGNOSIS: TBD  Chief Complaint: Initial Palliative care visit:Dementia  HISTORY OF PRESENT ILLNESS:  Sabrina Page is a 87 y.o. year old female  with multiple medical conditions including Dementia, worsening in the past 3 months with more memory loss, confusion and decline in functional status, worse during the day and evening time. Cueing is sometimes helpful.  History of asthma, chronic diastolic CHF, UTI, COPD, obesity, Dementia, HTN, hyperlipidemia. Patient with limited English and with cognitive defects related to Dementia. [name] and caregivers provided additional history/information.  History obtained from review of EMR, discussion with primary team, caregiver, family and/or [name].  Review and summarization of Epic records shows history from other than patient. Rest of 10 point ROS asked and negative.     Review of lab tests/diagnostics   Results for RIMA, BLIZZARD (MRN  [id number]) as of  11/06/2020 09:29  Ref. Range 08/27/2020 16:58  Sodium Latest Ref Range: 135 - 145 mmol/L 141  Potassium Latest Ref Range: 3.5 - 5.1 mmol/L 4.1  Chloride Latest Ref Range: 98 - 111 mmol/L 103  CO2 Latest Ref Range: 22 - 32 mmol/L 28  Glucose Latest Ref Range: 70 - 99 mg/dL 141 (H)  BUN Latest Ref Range: 8 - 23 mg/dL 17  Creatinine Latest Ref Range: 0.44 - 1.00 mg/dL 1.00  Calcium Latest Ref Range: 8.9 - 10.3 mg/dL 9.4  Anion gap Latest Ref Range: 5 - 15  10  Alkaline Phosphatase Latest Ref Range: 38 - 126 U/L 59  Albumin Latest Ref Range: 3.5 - 5.0 g/dL 4.1  AST Latest Ref Range: 15 - 41 U/L 16  ALT Latest Ref Range: 0 - 44 U/L 14  Total Protein Latest Ref Range: 6.5 - 8.1 g/dL 7.3  Total Bilirubin Latest Ref Range: 0.3 - 1.2 mg/dL 0.9  GFR, Estimated Latest Ref Range: >60 mL/min 56 (L)    ROS General: NAD EYES: denies vision changes ENMT: denies dysphagia Cardiovascular: denies chest pain/discomfort Pulmonary: denies cough, denies SOB Abdomen: endorses good appetite, denies constipation/diarrhea GU: denies dysuria, urinary frequency MSK:  denies weakness,  no falls reported Skin: denies rashes or wounds Neurological: denies pain, denies insomnia Psych: Endorses positive mood Heme/lymph/immuno: denies bruises, abnormal bleeding  Physical Exam: Height/Weight 5 feet 4 inches/270 Ibs approximately per [name] Constitutional: NAD General: Well groomed, cooperative EYES: anicteric sclera, lids intact, no discharge  ENMT: Moist mucous membrane CV: S1 S2, RRR, trace edema to BLE Pulmonary: LCTA, no increased work of breathing, no cough, Abdomen: active BS + 4 quadrants, soft and non tender GU: no suprapubic tenderness MSK: weakness, limited ROM Skin: warm and dry, no rashes or wounds on visible skin Neuro:  weakness, otherwise non focal, memory loss Psych: non-anxious affect Hem/lymph/immuno: no widespread bruising   PAST MEDICAL HISTORY:  Active Ambulatory  Problems    Diagnosis Date Noted   SHINGLES 10/23/2007   HYPERLIPIDEMIA 02/17/2007   INSOMNIA, CHRONIC 06/23/2009   Depression 02/17/2007   Carpal tunnel syndrome 11/28/2009   Essential hypertension 02/17/2007   Venous (peripheral) insufficiency 11/03/2007   BRONCHITIS, ACUTE 04/04/2008   Allergic rhinitis 02/17/2007   Asthma, moderate persistent, well-controlled 02/17/2007   DENTAL PAIN 10/23/2007   APHTHOUS STOMATITIS 03/25/2008   GERD 06/05/2008   DIVERTICULOSIS OF COLON 09/06/2008   DIVERTICULITIS OF COLON 09/06/2008   Actinic keratosis 04/08/2010   Osteoarthritis 06/05/2008   NECK PAIN 11/28/2009   Pain in limb 07/06/2007   DIZZINESS 11/28/2009   PARESTHESIA 10/23/2007   Edema 11/03/2007   Dyspnea on exertion 02/17/2007   Unspecified chest pain 09/06/2008   SYMPTOM, INCONTINENCE, URGE 02/17/2007   ABNORMAL GLUCOSE NEC 06/05/2008   TOBACCO USE, QUIT 04/11/2009   SHOULDER PAIN 05/21/2010   CONCUSSION WITH LOSS OF CONSCIOUSNESS 05/21/2010   Anxiety state 07/06/2010   Neoplasm of uncertain behavior of skin 01/11/2011   Weakness 04/22/2011   Hyperglycemia 04/22/2011   Low back pain radiating to both legs 05/14/2011   Falls frequently 05/14/2011   Obstructive sleep apnea 02/10/2012   External hemorrhoid, bleeding 02/14/2013   Chronic fatigue 06/25/2013   Right tennis elbow 01/02/2014   Wart 01/02/2014   Morbid obesity ([facility]) 03/06/2014   Concussion without loss of consciousness 09/13/2014   Fall at home 02/01/2015   Carotid bruit 06/26/2015   Hip pain, chronic 12/25/2015   Syncope due to orthostatic hypotension 01/18/2016   Orthostatic hypotension 01/18/2016  Closed right hip fracture ([facility]) 04/15/2016   Hip fracture ([place]) 04/15/2016   Knee pain, right 12/27/2016   Orthostatic hypotension dysautonomic syndrome 03/20/2017   Alzheimer disease ([facility]) 06/10/2017   Chronic respiratory failure with hypoxia ([facility]) 04/14/2018   Seborrheic dermatitis 06/14/2018   Urinary  incontinence 11/01/2018   UTI (urinary tract infection) 05/29/2019   Acute on chronic diastolic congestive heart failure ([place] [facility]) 11/17/2019   Resolved Ambulatory Problems    Diagnosis Date Noted   Asthma 02/17/2007   Symptomatic cholelithiasis 07/21/2011   Fall    Past Medical History:  Diagnosis Date   Allergy    Asthma    Chronic kidney disease    COPD (chronic obstructive pulmonary disease) (HCC)    Dizziness    Gallstones    GERD (gastroesophageal reflux disease)    Hyperlipidemia    Hypertension    Obesity    Osteoporosis    Peripheral vascular disease (HCC)    Pneumonia    Shortness of breath    Shoulder injury    Sleep apnea    Urinary incontinence     SOCIAL HX:  Social History   Tobacco Use   Smoking status: Former    Packs/day: 0.50    Years: 35.00    Pack years: 17.50    Types: Cigarettes    Quit date: 05/24/2001    Years since quitting: 19.4   Smokeless tobacco: Never  Substance Use Topics   Alcohol use: Yes    Comment: socially      FAMILY HX:  Family History  Problem Relation Age of Onset   Hypertension Mother    Arthritis Mother    Diabetes Mother    Stroke Mother    Stroke Father    Hypertension Father    Hypertension Other    CAD Neg Hx       ALLERGIES:  Allergies  Allergen Reactions   Atorvastatin Other (See Comments)    REACTION: upset stomach   Enalapril Maleate Other (See Comments)    Unknown reaction   Hctz [Hydrochlorothiazide] Other (See Comments)    dizziness   Lovastatin Other (See Comments)    Unknown reaction   Namenda [Memantine Hcl]     Feeling bad   Simvastatin Other (See Comments)    REACTION: mouth sores   Topamax [Topiramate] Other (See Comments)    syncope      PERTINENT MEDICATIONS:  Outpatient Encounter Medications as of 11/06/2020  Medication Sig   acetaminophen (TYLENOL) 500 MG tablet Take 500 mg by mouth every 6 (six) hours as needed for headache (pain).   ARIPiprazole (ABILIFY) 2 MG tablet TAKE 1  TABLET BY MOUTH EVERY DAY   aspirin 81 MG EC tablet Take 1 tablet (81 mg total) by mouth 2 (two) times daily.   B Complex-C (SUPER B COMPLEX PO) Take 1 tablet by mouth every morning.   Docusate Calcium (STOOL SOFTENER PO) Take 1 capsule by mouth every other day.   fludrocortisone (FLORINEF) 0.1 MG tablet Take 1 tablet (100 mcg total) by mouth daily. Annual appt due in Oct must see provider for future refills   FLUoxetine (PROZAC) 40 MG capsule TAKE 1 CAPSULE BY MOUTH EVERY DAY   furosemide (LASIX) 40 MG tablet TAKE 1 TABLET (40 MG TOTAL) BY MOUTH DAILY AS NEEDED FOR EDEMA. (Patient taking differently: Take 20 mg by mouth daily as needed for fluid or edema.)   ipratropium-albuterol (DUONEB) 0.5-2.5 (3) MG/3ML SOLN 1 neb every 6 hours if needed (Patient not  taking: No sig reported)   loratadine (CLARITIN) 10 MG tablet TAKE 1 TABLET BY MOUTH EVERY DAY IN THE MORNING   memantine (NAMENDA) 10 MG tablet Take 1 tablet (10 mg total) by mouth 2 (two) times daily. Annual appt due in Oct must see provider for future refills   midodrine (PROAMATINE) 10 MG tablet Take 0.5 tablets (5 mg total) by mouth in the morning and at bedtime.   Multiple Vitamins-Minerals (ZINC PO) Take 1 tablet by mouth daily.   nystatin cream (MYCOSTATIN) Apply 1 application topically daily as needed for dry skin (rash/irritation/itching).   Omega-3 Fatty Acids (FISH OIL) 1000 MG CAPS Take 1,000 mg by mouth every morning.   omeprazole (PRILOSEC) 20 MG capsule TAKE 2 CAPSULES BY MOUTH EVERY DAY   OVER THE COUNTER MEDICATION Apply 1 application topically daily as needed (knee pain). Hemp Freeze   polyethylene glycol powder (GLYCOLAX/MIRALAX) 17 GM/SCOOP powder Take 17 g by mouth 2 (two) times daily as needed for moderate constipation or severe constipation. (Patient not taking: No sig reported)   potassium chloride (KLOR-CON) 8 MEQ tablet TAKE 1 TABLET (8 MEQ TOTAL) BY MOUTH EVERY MORNING.   pyridostigmine (MESTINON) 60 MG tablet Take 1  tablet (60 mg total) by mouth 3 (three) times daily.   traMADol (ULTRAM) 50 MG tablet Take 1 tablet (50 mg total) by mouth every 12 (twelve) hours as needed for severe pain.   vitamin C (ASCORBIC ACID) 500 MG tablet Take 500 mg by mouth daily.   VITAMIN D-VITAMIN K PO Take 1 tablet by mouth daily. D 5000 units   No facility-administered encounter medications on file as of 11/06/2020.     Thank you for the opportunity to participate in the care of Ms. Donati.  The palliative care team will continue to follow. Please call our office at [phone] if we can be of additional assistance.   Note: Portions of this note were generated with [employer]. Dictation errors may occur despite best attempts at proofreading.  Teodoro Spray, NP

## 2020-11-08 DIAGNOSIS — J449 Chronic obstructive pulmonary disease, unspecified: Secondary | ICD-10-CM | POA: Diagnosis not present

## 2020-11-08 DIAGNOSIS — I5032 Chronic diastolic (congestive) heart failure: Secondary | ICD-10-CM | POA: Diagnosis not present

## 2020-11-08 DIAGNOSIS — I110 Hypertensive heart disease with heart failure: Secondary | ICD-10-CM | POA: Diagnosis not present

## 2020-11-08 DIAGNOSIS — G309 Alzheimer's disease, unspecified: Secondary | ICD-10-CM | POA: Diagnosis not present

## 2020-11-08 DIAGNOSIS — F0280 Dementia in other diseases classified elsewhere without behavioral disturbance: Secondary | ICD-10-CM | POA: Diagnosis not present

## 2020-11-08 DIAGNOSIS — N3000 Acute cystitis without hematuria: Secondary | ICD-10-CM | POA: Diagnosis not present

## 2020-11-10 DIAGNOSIS — N3000 Acute cystitis without hematuria: Secondary | ICD-10-CM | POA: Diagnosis not present

## 2020-11-10 DIAGNOSIS — J449 Chronic obstructive pulmonary disease, unspecified: Secondary | ICD-10-CM | POA: Diagnosis not present

## 2020-11-10 DIAGNOSIS — I5032 Chronic diastolic (congestive) heart failure: Secondary | ICD-10-CM | POA: Diagnosis not present

## 2020-11-10 DIAGNOSIS — G309 Alzheimer's disease, unspecified: Secondary | ICD-10-CM | POA: Diagnosis not present

## 2020-11-10 DIAGNOSIS — F0280 Dementia in other diseases classified elsewhere without behavioral disturbance: Secondary | ICD-10-CM | POA: Diagnosis not present

## 2020-11-10 DIAGNOSIS — I110 Hypertensive heart disease with heart failure: Secondary | ICD-10-CM | POA: Diagnosis not present

## 2020-11-17 DIAGNOSIS — N3000 Acute cystitis without hematuria: Secondary | ICD-10-CM | POA: Diagnosis not present

## 2020-11-17 DIAGNOSIS — Z7982 Long term (current) use of aspirin: Secondary | ICD-10-CM | POA: Diagnosis not present

## 2020-11-17 DIAGNOSIS — G4733 Obstructive sleep apnea (adult) (pediatric): Secondary | ICD-10-CM | POA: Diagnosis not present

## 2020-11-17 DIAGNOSIS — I872 Venous insufficiency (chronic) (peripheral): Secondary | ICD-10-CM | POA: Diagnosis not present

## 2020-11-17 DIAGNOSIS — G309 Alzheimer's disease, unspecified: Secondary | ICD-10-CM | POA: Diagnosis not present

## 2020-11-17 DIAGNOSIS — J9611 Chronic respiratory failure with hypoxia: Secondary | ICD-10-CM | POA: Diagnosis not present

## 2020-11-17 DIAGNOSIS — Z792 Long term (current) use of antibiotics: Secondary | ICD-10-CM | POA: Diagnosis not present

## 2020-11-17 DIAGNOSIS — E785 Hyperlipidemia, unspecified: Secondary | ICD-10-CM | POA: Diagnosis not present

## 2020-11-17 DIAGNOSIS — K219 Gastro-esophageal reflux disease without esophagitis: Secondary | ICD-10-CM | POA: Diagnosis not present

## 2020-11-17 DIAGNOSIS — G5600 Carpal tunnel syndrome, unspecified upper limb: Secondary | ICD-10-CM | POA: Diagnosis not present

## 2020-11-17 DIAGNOSIS — I5032 Chronic diastolic (congestive) heart failure: Secondary | ICD-10-CM | POA: Diagnosis not present

## 2020-11-17 DIAGNOSIS — Z9181 History of falling: Secondary | ICD-10-CM | POA: Diagnosis not present

## 2020-11-17 DIAGNOSIS — I110 Hypertensive heart disease with heart failure: Secondary | ICD-10-CM | POA: Diagnosis not present

## 2020-11-17 DIAGNOSIS — F0280 Dementia in other diseases classified elsewhere without behavioral disturbance: Secondary | ICD-10-CM | POA: Diagnosis not present

## 2020-11-17 DIAGNOSIS — Z87891 Personal history of nicotine dependence: Secondary | ICD-10-CM | POA: Diagnosis not present

## 2020-11-17 DIAGNOSIS — I951 Orthostatic hypotension: Secondary | ICD-10-CM | POA: Diagnosis not present

## 2020-11-17 DIAGNOSIS — Z96641 Presence of right artificial hip joint: Secondary | ICD-10-CM | POA: Diagnosis not present

## 2020-11-17 DIAGNOSIS — J449 Chronic obstructive pulmonary disease, unspecified: Secondary | ICD-10-CM | POA: Diagnosis not present

## 2020-11-17 DIAGNOSIS — F32A Depression, unspecified: Secondary | ICD-10-CM | POA: Diagnosis not present

## 2020-11-17 DIAGNOSIS — Z96653 Presence of artificial knee joint, bilateral: Secondary | ICD-10-CM | POA: Diagnosis not present

## 2020-11-17 DIAGNOSIS — E1151 Type 2 diabetes mellitus with diabetic peripheral angiopathy without gangrene: Secondary | ICD-10-CM | POA: Diagnosis not present

## 2020-11-21 ENCOUNTER — Ambulatory Visit: Payer: Self-pay

## 2020-11-21 NOTE — Chronic Care Management (AMB) (Signed)
  Care Management  Care Management Phone Note  11/21/2020 Name: Kynzlie Hilleary MRN: [id number] DOB: 1933-05-31 Genavive Kubicki is a 87 y.o. year old female who is a primary care patient of Plotnikov, Evie Lacks, MD.   The Care Management team was consulted to assess patient's needs after disconnection with services provided by [facility].  Unsuccessful outreach was made by telephone today to assess patient care needs post discontinuation of [facility].    Plan: The care management team will attempt to reach patient again to assess care needs post [facility] discontinuation.    Tomasa Rand, RN, BSN, CEN [facility] RN Case Manager [phone]

## 2020-11-28 DIAGNOSIS — G309 Alzheimer's disease, unspecified: Secondary | ICD-10-CM | POA: Diagnosis not present

## 2020-11-28 DIAGNOSIS — F0280 Dementia in other diseases classified elsewhere without behavioral disturbance: Secondary | ICD-10-CM | POA: Diagnosis not present

## 2020-11-28 DIAGNOSIS — N3000 Acute cystitis without hematuria: Secondary | ICD-10-CM | POA: Diagnosis not present

## 2020-11-28 DIAGNOSIS — I110 Hypertensive heart disease with heart failure: Secondary | ICD-10-CM | POA: Diagnosis not present

## 2020-11-28 DIAGNOSIS — I5032 Chronic diastolic (congestive) heart failure: Secondary | ICD-10-CM | POA: Diagnosis not present

## 2020-11-28 DIAGNOSIS — J449 Chronic obstructive pulmonary disease, unspecified: Secondary | ICD-10-CM | POA: Diagnosis not present

## 2020-12-01 DIAGNOSIS — I110 Hypertensive heart disease with heart failure: Secondary | ICD-10-CM | POA: Diagnosis not present

## 2020-12-01 DIAGNOSIS — N3000 Acute cystitis without hematuria: Secondary | ICD-10-CM | POA: Diagnosis not present

## 2020-12-01 DIAGNOSIS — J449 Chronic obstructive pulmonary disease, unspecified: Secondary | ICD-10-CM | POA: Diagnosis not present

## 2020-12-01 DIAGNOSIS — I5032 Chronic diastolic (congestive) heart failure: Secondary | ICD-10-CM | POA: Diagnosis not present

## 2020-12-01 DIAGNOSIS — F0280 Dementia in other diseases classified elsewhere without behavioral disturbance: Secondary | ICD-10-CM | POA: Diagnosis not present

## 2020-12-01 DIAGNOSIS — G309 Alzheimer's disease, unspecified: Secondary | ICD-10-CM | POA: Diagnosis not present

## 2020-12-08 ENCOUNTER — Other Ambulatory Visit: Payer: Self-pay

## 2020-12-08 ENCOUNTER — Telehealth (INDEPENDENT_AMBULATORY_CARE_PROVIDER_SITE_OTHER): Payer: Medicare Other | Admitting: Neurology

## 2020-12-08 ENCOUNTER — Telehealth: Payer: Self-pay

## 2020-12-08 DIAGNOSIS — G309 Alzheimer's disease, unspecified: Secondary | ICD-10-CM

## 2020-12-08 DIAGNOSIS — I951 Orthostatic hypotension: Secondary | ICD-10-CM

## 2020-12-08 DIAGNOSIS — F0280 Dementia in other diseases classified elsewhere without behavioral disturbance: Secondary | ICD-10-CM

## 2020-12-08 MED ORDER — MIDODRINE HCL 10 MG PO TABS: 5.0000 mg | ORAL_TABLET | Freq: Three times a day (TID) | ORAL | 3 refills | Status: DC

## 2020-12-08 NOTE — Progress Notes (Signed)
   Due to the COVID-19 crisis, this telephone visit was done via telephone from my office and it was initiated and consent given by this patient and or family.  Telephone (Audio) Visit The purpose of this telephone visit is to provide medical care while limiting exposure to the novel coronavirus.    Consent was obtained for telephone visit and initiated by pt/family:  Yes.   Answered questions that patient had about telehealth interaction:  Yes.   I discussed the limitations, risks, security and privacy concerns of performing an evaluation and management service by telephone. I also discussed with the patient that there may be a patient responsible charge related to this service. The patient expressed understanding and agreed to proceed.  Pt location: Home Physician Location: office Name of referring provider:  Plotnikov, Evie Lacks, MD I connected with .Sabrina Page at patients initiation/request on 12/08/2020 at  8:30 AM EDT by telephone and verified that I am speaking with the correct person using two identifiers.  Pt MRN:  [id number] Pt DOB:  08-Aug-1933  Assessment/Plan:   1.  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  She also has history of convulsive syncope, unlikely new seizure.   - Increase midodrine 5mg  three times daily, while she is on scheduled lasix.  Monitor for supine hypertension.   - Continue florinef 0.1mg  daily  - Continue mestinon 60mg  three times daily  2. Alzheimer's dementia without behavior changes, progressive.  Dependent on all ADLs except feeding.  - She nearly round the clock caregiver support  - Continue Namenda 10mg  BID  - Continue palliative care    Subjective:   There has been a gradual and progressive decline in independent function and cognition.  She does not engage in conversation, but will answer questions appropriately.  She is able to stand in standing  frame, but feels like she is getting close to needing a hoyer lift.  She is nonambulatory and tends to lean on her right side.  She is antigravity in the arms, no facial weakness or difficulty swallowing.    She is having significant leg swelling and taking lasix 40mg  daily. She was instructed to take lasix 80mg /d, but due to her history of hypotension, daughter wanted to keep her on a lower dose.  She has been dizzy since started diuretics.    She continues to take midodrine 5mg  twice daily.  She went to the ER in December for seizure-like episode which was witnessed by caregiver.  CT head negative.    Objective:   There were no vitals filed for this visit.   Follow Up Instructions:      -I discussed the assessment and treatment plan with the patient. The patient was provided an opportunity to ask questions and all were answered. The patient agreed with the plan and demonstrated an understanding of the instructions.   The patient was advised to call back or seek an in-person evaluation if the symptoms worsen or if the condition fails to improve as anticipated.  Follow-up in 6 months  Total Time spent in visit with the patient was:  21 min, of which 100% of the time was spent in counseling and/or coordinating care on.   Pt understands and agrees with the plan of care outlined.     Alda Berthold, DO

## 2020-12-08 NOTE — Telephone Encounter (Signed)
  Care Management  Care Management Phone Note  12/08/2020 Name: [name] MRN: [id number] DOB: 09/28/1933 Sabrina Page is a 87 y.o. year old female who is a primary care patient of Plotnikov, Evie Lacks, MD.   The Care Management team was consulted to assess patient's needs after disconnection with services provided by [facility].  Successful outreach was made by telephone today to assess patient care needs post discontinuation of [facility].  Placed call to patient and spoke with daughter, [name], who states yes they are interested in services.  Reports patient is also active with palliative care.  Patient confirmed receipt of in home care service discontinuation letter: Yes  Plan: The patient expressed ongoing care management/care coordination service needs and will be contacted by the scheduling team for an appointment with the RN Care Manager.    Tomasa Rand, RN, BSN, CEN [facility] [employer] [phone]

## 2020-12-13 ENCOUNTER — Other Ambulatory Visit: Payer: Self-pay | Admitting: Internal Medicine

## 2020-12-25 ENCOUNTER — Other Ambulatory Visit: Payer: Self-pay

## 2020-12-25 ENCOUNTER — Other Ambulatory Visit: Payer: Medicare Other | Admitting: Hospice

## 2020-12-25 DIAGNOSIS — I5033 Acute on chronic diastolic (congestive) heart failure: Secondary | ICD-10-CM | POA: Diagnosis not present

## 2020-12-25 DIAGNOSIS — F0390 Unspecified dementia without behavioral disturbance: Secondary | ICD-10-CM | POA: Diagnosis not present

## 2020-12-25 DIAGNOSIS — Z515 Encounter for palliative care: Secondary | ICD-10-CM | POA: Diagnosis not present

## 2020-12-25 NOTE — Progress Notes (Signed)
[facility] Consult Note Telephone: [phone]  Fax: [phone]  PATIENT NAME: Sabrina Page DOB: [birth year] MRN: [id number]  PRIMARY CARE PROVIDER:   Cassandria Anger, MD Plotnikov, Evie Lacks, MD [address] [place],  [place] [address]  REFERRING PROVIDER: Cassandria Anger, MD Plotnikov, Evie Lacks, MD [address] [place],  [place] [address]  RESPONSIBLE PARTY:  Sabrina Page -  Physical therapist, best to call Contact Information     Name Relation Home Work Mobile   Sabrina Page Daughter Sedan: [id number] - caregiver, next to call [place] caregiver Sabrina Page - caregiver [id number]   Visit is to build trust and highlight Palliative Medicine as specialized medical care for people living with serious illness, aimed at facilitating better quality of life through symptoms relief, assisting with advance care planning and complex medical decision making.  NP called daughter and discussed patient's status.  This is a follow up visit.  RECOMMENDATIONS/PLAN:   Advance Care Planning/Code Status: Patient is a Do Not Resuscitate  Goals of Care: Goals of care include to maximize quality of life and symptom management.  Daughter is interested in hospice service in the future; she will take a decision after patient's visit with PCP next week.  Visit consisted of counseling and education dealing with the complex and emotionally intense issues of symptom management and palliative care in the setting of serious and potentially life-threatening illness. Palliative care team will continue to support patient, patient's family, and medical team.  Symptom management/Plan:  Dementia: Advanced. FAST 7b. Total care, uses stander for transfers. Continue ongoing supportive care.  Weakness: PT/OT is ongoing. CHF: Managed with Lasix and Potassium. Routine CBC BMP. Education provided on no added salt.   Follow-up visit with PCP next week Tuesday.  Daughter will call NP with any updates. Follow up: Palliative care will continue to follow for complex medical decision making, advance care planning, and clarification of goals. Return 6 weeks or prn. Encouraged to call provider sooner with any concerns.  CHIEF COMPLAINT: Palliative follow up  HISTORY OF PRESENT ILLNESS:  Sabrina Page a 87 y.o. female with multiple medical problems including advanced dementia, COPD/asthma , chronic diastolic CHF, UTI, obesity, HTN, hyperlipidemia. Patient with limited English and with cognitive defects related to Dementia.  Patient in no acute distress, no fall or hospitalization since last visit.  Sabrina Page and caregivers provided additional history/information.History obtained from review of EMR, discussion with primary team, family and/or patient. Records reviewed and summarized above. All 10 point systems reviewed and are negative except as documented in history of present illness above  Review and summarization of Epic records shows history from other than patient.   Palliative Care was asked to follow this patient o help address complex decision making in the context of advance care planning and goals of care clarification.   PHYSICAL EXAM  Height/Weight 5 feet 4 inches/270 Ibs  Constitutional: NAD General: Well groomed, cooperative EYES: anicteric sclera, lids intact, no discharge ENMT: Moist mucous membrane CV: S1 S2, RRR, no edema to BLE Pulmonary: LCTA, no increased work of breathing, no cough, Abdomen: active BS + 4 quadrants, soft and non tender GU: no suprapubic tenderness MSK: weakness, limited ROM, stander and max assist for transfers Skin: warm and dry, no rashes or wounds on visible skin Neuro:  weakness, otherwise non focal, memory loss Psych: non-anxious affect Hem/lymph/immuno: no widespread bruising  PERTINENT MEDICATIONS:  Outpatient Encounter  Medications as of 12/25/2020   Medication Sig   acetaminophen (TYLENOL) 500 MG tablet Take 500 mg by mouth every 6 (six) hours as needed for headache (pain).   ARIPiprazole (ABILIFY) 2 MG tablet TAKE 1 TABLET BY MOUTH EVERY DAY   aspirin 81 MG EC tablet Take 1 tablet (81 mg total) by mouth 2 (two) times daily.   B Complex-C (SUPER B COMPLEX PO) Take 1 tablet by mouth every morning.   Docusate Calcium (STOOL SOFTENER PO) Take 1 capsule by mouth every other day.   fludrocortisone (FLORINEF) 0.1 MG tablet Take 1 tablet (100 mcg total) by mouth daily. Annual appt due in Oct must see provider for future refills   FLUoxetine (PROZAC) 40 MG capsule TAKE 1 CAPSULE BY MOUTH EVERY DAY   furosemide (LASIX) 40 MG tablet TAKE 1 TABLET (40 MG TOTAL) BY MOUTH DAILY AS NEEDED FOR EDEMA. (Patient taking differently: Take 20 mg by mouth daily as needed for fluid or edema.)   loratadine (CLARITIN) 10 MG tablet TAKE 1 TABLET BY MOUTH EVERY DAY IN THE MORNING   memantine (NAMENDA) 10 MG tablet Take 1 tablet (10 mg total) by mouth 2 (two) times daily. Annual appt due in Oct must see provider for future refills   midodrine (PROAMATINE) 10 MG tablet TAKE 1 TABLET BY MOUTH THREE TIMES A DAY   Multiple Vitamins-Minerals (ZINC PO) Take 1 tablet by mouth daily.   nystatin cream (MYCOSTATIN) Apply 1 application topically daily as needed for dry skin (rash/irritation/itching).   Omega-3 Fatty Acids (FISH OIL) 1000 MG CAPS Take 1,000 mg by mouth every morning.   omeprazole (PRILOSEC) 20 MG capsule TAKE 2 CAPSULES BY MOUTH EVERY DAY   OVER THE COUNTER MEDICATION Apply 1 application topically daily as needed (knee pain). Hemp Freeze   potassium chloride (KLOR-CON) 8 MEQ tablet TAKE 1 TABLET (8 MEQ TOTAL) BY MOUTH EVERY MORNING.   pyridostigmine (MESTINON) 60 MG tablet Take 1 tablet (60 mg total) by mouth 3 (three) times daily.   traMADol (ULTRAM) 50 MG tablet Take 1 tablet (50 mg total) by mouth every 12 (twelve) hours as needed for severe pain.   vitamin C  (ASCORBIC ACID) 500 MG tablet Take 500 mg by mouth daily.   VITAMIN D-VITAMIN K PO Take 1 tablet by mouth daily. D 5000 units   No facility-administered encounter medications on file as of 12/25/2020.    HOSPICE ELIGIBILITY/DIAGNOSIS: TBD  PAST MEDICAL HISTORY:  Past Medical History:  Diagnosis Date   Allergy    rhinitis   Asthma    Chronic kidney disease    hx of cystitis    COPD (chronic obstructive pulmonary disease) (HCC)    Depression    Dizziness    vertigo    Gallstones    GERD (gastroesophageal reflux disease)    Hyperlipidemia    Hypertension    Obesity    Osteoarthritis    Osteoporosis    Peripheral vascular disease (HCC)    swelling    Pneumonia    Shortness of breath    with exertion    Shoulder injury    left   Sleep apnea    never diagnosed    Urinary incontinence     ALLERGIES:  Allergies  Allergen Reactions   Atorvastatin Other (See Comments)    REACTION: upset stomach   Enalapril Maleate Other (See Comments)    Unknown reaction   Hctz [Hydrochlorothiazide] Other (See Comments)    dizziness   Lovastatin Other (See  Comments)    Unknown reaction   Namenda [Memantine Hcl]     Feeling bad   Simvastatin Other (See Comments)    REACTION: mouth sores   Topamax [Topiramate] Other (See Comments)    syncope      I spent 60 minutes providing this consultation; this includes time spent with patient/family, chart review and documentation. More than 50% of the time in this consultation was spent on counseling and coordinating communication   Thank you for the opportunity to participate in the care of [place] Please call our office at [phone] if we can be of additional assistance.  Note: Portions of this note were generated with [employer]. Dictation errors may occur despite best attempts at proofreading.  Teodoro Spray, NP

## 2021-01-04 ENCOUNTER — Other Ambulatory Visit: Payer: Self-pay | Admitting: Internal Medicine

## 2021-01-06 ENCOUNTER — Telehealth (INDEPENDENT_AMBULATORY_CARE_PROVIDER_SITE_OTHER): Payer: Medicare Other | Admitting: Internal Medicine

## 2021-01-06 ENCOUNTER — Encounter: Payer: Self-pay | Admitting: Internal Medicine

## 2021-01-06 DIAGNOSIS — M79605 Pain in left leg: Secondary | ICD-10-CM | POA: Diagnosis not present

## 2021-01-06 DIAGNOSIS — M545 Low back pain, unspecified: Secondary | ICD-10-CM | POA: Diagnosis not present

## 2021-01-06 DIAGNOSIS — R531 Weakness: Secondary | ICD-10-CM

## 2021-01-06 DIAGNOSIS — G309 Alzheimer's disease, unspecified: Secondary | ICD-10-CM

## 2021-01-06 DIAGNOSIS — I10 Essential (primary) hypertension: Secondary | ICD-10-CM

## 2021-01-06 DIAGNOSIS — M79604 Pain in right leg: Secondary | ICD-10-CM

## 2021-01-06 DIAGNOSIS — F0280 Dementia in other diseases classified elsewhere without behavioral disturbance: Secondary | ICD-10-CM

## 2021-01-06 MED ORDER — FLUDROCORTISONE ACETATE 0.1 MG PO TABS: 100.0000 ug | ORAL_TABLET | Freq: Every day | ORAL | 1 refills | Status: DC

## 2021-01-06 MED ORDER — LORATADINE 10 MG PO TABS: ORAL_TABLET | ORAL | 3 refills | Status: DC

## 2021-01-06 MED ORDER — OMEPRAZOLE 20 MG PO CPDR: 40.0000 mg | DELAYED_RELEASE_CAPSULE | Freq: Every day | ORAL | 3 refills | Status: DC

## 2021-01-06 MED ORDER — ASPIRIN 81 MG PO TBEC: 81.0000 mg | DELAYED_RELEASE_TABLET | Freq: Two times a day (BID) | ORAL | 3 refills | Status: DC

## 2021-01-06 MED ORDER — B COMPLEX PLUS PO TABS: 1.0000 | ORAL_TABLET | Freq: Every day | ORAL | 3 refills | Status: DC

## 2021-01-06 MED ORDER — CALCIUM-VITAMIN D-VITAMIN K 500-200-90 MG-UNT-MCG PO TABS: ORAL_TABLET | ORAL | 3 refills | Status: DC

## 2021-01-06 MED ORDER — FLUOXETINE HCL 40 MG PO CAPS: ORAL_CAPSULE | ORAL | 3 refills | Status: DC

## 2021-01-06 MED ORDER — POTASSIUM CHLORIDE ER 8 MEQ PO TBCR: 8.0000 meq | EXTENDED_RELEASE_TABLET | Freq: Every morning | ORAL | 1 refills | Status: DC

## 2021-01-06 MED ORDER — DOCUSATE SODIUM 100 MG PO CAPS: 100.0000 mg | ORAL_CAPSULE | Freq: Two times a day (BID) | ORAL | 3 refills | Status: DC

## 2021-01-06 MED ORDER — MEMANTINE HCL 10 MG PO TABS: 10.0000 mg | ORAL_TABLET | Freq: Two times a day (BID) | ORAL | 3 refills | Status: DC

## 2021-01-06 MED ORDER — MIDODRINE HCL 10 MG PO TABS: 10.0000 mg | ORAL_TABLET | Freq: Three times a day (TID) | ORAL | 3 refills | Status: DC

## 2021-01-06 MED ORDER — NYSTATIN 100000 UNIT/GM EX CREA: 1.0000 "application " | TOPICAL_CREAM | Freq: Every day | CUTANEOUS | 3 refills | Status: DC | PRN

## 2021-01-06 MED ORDER — PYRIDOSTIGMINE BROMIDE 60 MG PO TABS: 60.0000 mg | ORAL_TABLET | Freq: Three times a day (TID) | ORAL | 3 refills | Status: DC

## 2021-01-06 NOTE — Progress Notes (Signed)
Virtual Visit via Video Note  I connected with Sabrina Page on 01/06/21 at  2:00 PM EDT by a video enabled telemedicine application and verified that I am speaking with the correct person using two identifiers.   I discussed the limitations of evaluation and management by telemedicine and the availability of in person appointments. The patient expressed understanding and agreed to proceed.  I was located at our [facility] office. The patient was at home. Dtr Sabrina Page was present in the visit.   History of Present Illness: We need to follow-up on OA, LBP,  dementia, FTT - getting worse...  She is wheelchair-bound.  She has not been not able to walk or stand without assistance. She needs a [employer] fast streamer.   Observations/Objective: The patient appears to be in no acute distress.  Assessment and Plan:  See my Assessment and Plan. Follow Up Instructions:    I discussed the assessment and treatment plan with the patient. The patient was provided an opportunity to ask questions and all were answered. The patient agreed with the plan and demonstrated an understanding of the instructions.   The patient was advised to call back or seek an in-person evaluation if the symptoms worsen or if the condition fails to improve as anticipated.  I provided face-to-face time during this encounter. We were at different locations.   Sabrina Kehr, MD

## 2021-01-07 NOTE — Assessment & Plan Note (Signed)
Cont on Namenda   Dementia is worse and progressing.  She has severe difficulties with short-term memory.  She has been suffering with worsening apathy.  She has not been able to learn new things.  She has been losing all skills.  She has been not completely dependent on her  Helpers, her daughter for daily routine.  She is wheelchair-bound.  She has not been not able to walk or stand without assistance. She needs a [employer] fast streamer.

## 2021-01-07 NOTE — Assessment & Plan Note (Signed)
She is wheelchair-bound.  She has not been not able to walk or stand without assistance. She needs a [employer] fast streamer.

## 2021-01-13 ENCOUNTER — Inpatient Hospital Stay (HOSPITAL_COMMUNITY)
Admission: EM | Admit: 2021-01-13 | Discharge: 2021-01-18 | DRG: 177 | Disposition: A | Discharge status: Home / Self Care | Payer: Medicare Other | Attending: Family Medicine | Admitting: Family Medicine

## 2021-01-13 ENCOUNTER — Encounter (HOSPITAL_COMMUNITY): Payer: Self-pay | Admitting: Student

## 2021-01-13 ENCOUNTER — Other Ambulatory Visit: Payer: Self-pay

## 2021-01-13 ENCOUNTER — Emergency Department (HOSPITAL_COMMUNITY): Payer: Medicare Other

## 2021-01-13 DIAGNOSIS — J440 Chronic obstructive pulmonary disease with acute lower respiratory infection: Secondary | ICD-10-CM | POA: Diagnosis present

## 2021-01-13 DIAGNOSIS — I5032 Chronic diastolic (congestive) heart failure: Secondary | ICD-10-CM | POA: Diagnosis present

## 2021-01-13 DIAGNOSIS — F419 Anxiety disorder, unspecified: Secondary | ICD-10-CM | POA: Diagnosis present

## 2021-01-13 DIAGNOSIS — Z8261 Family history of arthritis: Secondary | ICD-10-CM | POA: Diagnosis not present

## 2021-01-13 DIAGNOSIS — J9601 Acute respiratory failure with hypoxia: Secondary | ICD-10-CM | POA: Diagnosis present

## 2021-01-13 DIAGNOSIS — F0280 Dementia in other diseases classified elsewhere without behavioral disturbance: Secondary | ICD-10-CM | POA: Diagnosis present

## 2021-01-13 DIAGNOSIS — I10 Essential (primary) hypertension: Secondary | ICD-10-CM | POA: Diagnosis not present

## 2021-01-13 DIAGNOSIS — R6889 Other general symptoms and signs: Secondary | ICD-10-CM | POA: Diagnosis not present

## 2021-01-13 DIAGNOSIS — U071 COVID-19: Principal | ICD-10-CM | POA: Diagnosis present

## 2021-01-13 DIAGNOSIS — G8929 Other chronic pain: Secondary | ICD-10-CM | POA: Diagnosis present

## 2021-01-13 DIAGNOSIS — R29818 Other symptoms and signs involving the nervous system: Secondary | ICD-10-CM | POA: Diagnosis not present

## 2021-01-13 DIAGNOSIS — Z6841 Body Mass Index (BMI) 40.0 and over, adult: Secondary | ICD-10-CM

## 2021-01-13 DIAGNOSIS — R0602 Shortness of breath: Secondary | ICD-10-CM | POA: Diagnosis not present

## 2021-01-13 DIAGNOSIS — Z66 Do not resuscitate: Secondary | ICD-10-CM | POA: Diagnosis present

## 2021-01-13 DIAGNOSIS — E785 Hyperlipidemia, unspecified: Secondary | ICD-10-CM | POA: Diagnosis present

## 2021-01-13 DIAGNOSIS — F329 Major depressive disorder, single episode, unspecified: Secondary | ICD-10-CM

## 2021-01-13 DIAGNOSIS — Z209 Contact with and (suspected) exposure to unspecified communicable disease: Secondary | ICD-10-CM | POA: Diagnosis not present

## 2021-01-13 DIAGNOSIS — M1990 Unspecified osteoarthritis, unspecified site: Secondary | ICD-10-CM | POA: Diagnosis present

## 2021-01-13 DIAGNOSIS — R296 Repeated falls: Secondary | ICD-10-CM

## 2021-01-13 DIAGNOSIS — Z79899 Other long term (current) drug therapy: Secondary | ICD-10-CM

## 2021-01-13 DIAGNOSIS — Z823 Family history of stroke: Secondary | ICD-10-CM | POA: Diagnosis not present

## 2021-01-13 DIAGNOSIS — F32A Depression, unspecified: Secondary | ICD-10-CM | POA: Diagnosis present

## 2021-01-13 DIAGNOSIS — M545 Low back pain, unspecified: Secondary | ICD-10-CM | POA: Diagnosis present

## 2021-01-13 DIAGNOSIS — G901 Familial dysautonomia [Riley-Day]: Secondary | ICD-10-CM | POA: Diagnosis present

## 2021-01-13 DIAGNOSIS — Z96652 Presence of left artificial knee joint: Secondary | ICD-10-CM | POA: Diagnosis present

## 2021-01-13 DIAGNOSIS — G309 Alzheimer's disease, unspecified: Secondary | ICD-10-CM | POA: Diagnosis present

## 2021-01-13 DIAGNOSIS — I110 Hypertensive heart disease with heart failure: Secondary | ICD-10-CM | POA: Diagnosis present

## 2021-01-13 DIAGNOSIS — I951 Orthostatic hypotension: Secondary | ICD-10-CM | POA: Diagnosis present

## 2021-01-13 DIAGNOSIS — Z888 Allergy status to other drugs, medicaments and biological substances status: Secondary | ICD-10-CM

## 2021-01-13 DIAGNOSIS — G319 Degenerative disease of nervous system, unspecified: Secondary | ICD-10-CM | POA: Diagnosis not present

## 2021-01-13 DIAGNOSIS — K219 Gastro-esophageal reflux disease without esophagitis: Secondary | ICD-10-CM | POA: Diagnosis present

## 2021-01-13 DIAGNOSIS — Z87891 Personal history of nicotine dependence: Secondary | ICD-10-CM | POA: Diagnosis not present

## 2021-01-13 DIAGNOSIS — Z7401 Bed confinement status: Secondary | ICD-10-CM

## 2021-01-13 DIAGNOSIS — J1282 Pneumonia due to coronavirus disease 2019: Secondary | ICD-10-CM | POA: Diagnosis present

## 2021-01-13 DIAGNOSIS — Z9049 Acquired absence of other specified parts of digestive tract: Secondary | ICD-10-CM

## 2021-01-13 DIAGNOSIS — Z96641 Presence of right artificial hip joint: Secondary | ICD-10-CM | POA: Diagnosis present

## 2021-01-13 DIAGNOSIS — Z833 Family history of diabetes mellitus: Secondary | ICD-10-CM

## 2021-01-13 DIAGNOSIS — G919 Hydrocephalus, unspecified: Secondary | ICD-10-CM | POA: Diagnosis not present

## 2021-01-13 DIAGNOSIS — Z5329 Procedure and treatment not carried out because of patient's decision for other reasons: Secondary | ICD-10-CM | POA: Diagnosis present

## 2021-01-13 DIAGNOSIS — Z743 Need for continuous supervision: Secondary | ICD-10-CM | POA: Diagnosis not present

## 2021-01-13 DIAGNOSIS — R627 Adult failure to thrive: Secondary | ICD-10-CM | POA: Diagnosis present

## 2021-01-13 DIAGNOSIS — Z8249 Family history of ischemic heart disease and other diseases of the circulatory system: Secondary | ICD-10-CM

## 2021-01-13 DIAGNOSIS — Z7982 Long term (current) use of aspirin: Secondary | ICD-10-CM

## 2021-01-13 DIAGNOSIS — R519 Headache, unspecified: Secondary | ICD-10-CM | POA: Diagnosis not present

## 2021-01-13 DIAGNOSIS — R0902 Hypoxemia: Secondary | ICD-10-CM | POA: Diagnosis not present

## 2021-01-13 LAB — RESP PANEL BY RT-PCR (FLU A&B, COVID) ARPGX2
Influenza A by PCR: NEGATIVE
Influenza B by PCR: NEGATIVE
SARS Coronavirus 2 by RT PCR: POSITIVE — AB

## 2021-01-13 LAB — COMPREHENSIVE METABOLIC PANEL
ALT: 15 U/L (ref 0–44)
AST: 19 U/L (ref 15–41)
Albumin: 3.3 g/dL — ABNORMAL LOW (ref 3.5–5.0)
Alkaline Phosphatase: 59 U/L (ref 38–126)
Anion gap: 10 (ref 5–15)
BUN: 13 mg/dL (ref 8–23)
CO2: 28 mmol/L (ref 22–32)
Calcium: 8.4 mg/dL — ABNORMAL LOW (ref 8.9–10.3)
Chloride: 96 mmol/L — ABNORMAL LOW (ref 98–111)
Creatinine, Ser: 0.87 mg/dL (ref 0.44–1.00)
GFR, Estimated: >60 mL/min (ref >60)
Glucose, Bld: 90 mg/dL (ref 70–99)
Potassium: 3.7 mmol/L (ref 3.5–5.1)
Sodium: 134 mmol/L — ABNORMAL LOW (ref 135–145)
Total Bilirubin: 0.7 mg/dL (ref 0.3–1.2)
Total Protein: 6.2 g/dL — ABNORMAL LOW (ref 6.5–8.1)

## 2021-01-13 LAB — HEMOGLOBIN A1C
Hgb A1c MFr Bld: 5.5 % (ref 4.8–5.6)
Mean Plasma Glucose: 111.15 mg/dL

## 2021-01-13 LAB — CBC WITH DIFFERENTIAL/PLATELET
Abs Immature Granulocytes: 0.07 10*3/uL (ref 0.00–0.07)
Basophils Absolute: 0 10*3/uL (ref 0.0–0.1)
Basophils Relative: 1 %
Eosinophils Absolute: 0 10*3/uL (ref 0.0–0.5)
Eosinophils Relative: 0 %
HCT: 42.7 % (ref 36.0–46.0)
Hemoglobin: 13.7 g/dL (ref 12.0–15.0)
Immature Granulocytes: 2 %
Lymphocytes Relative: 27 %
Lymphs Abs: 1 10*3/uL (ref 0.7–4.0)
MCH: 29.1 pg (ref 26.0–34.0)
MCHC: 32.1 g/dL (ref 30.0–36.0)
MCV: 90.9 fL (ref 80.0–100.0)
Monocytes Absolute: 0.6 10*3/uL (ref 0.1–1.0)
Monocytes Relative: 16 %
Neutro Abs: 2.1 10*3/uL (ref 1.7–7.7)
Neutrophils Relative %: 54 %
Platelets: 122 10*3/uL — ABNORMAL LOW (ref 150–400)
RBC: 4.7 MIL/uL (ref 3.87–5.11)
RDW: 13.2 % (ref 11.5–15.5)
WBC: 3.8 10*3/uL — ABNORMAL LOW (ref 4.0–10.5)
nRBC: 0 % (ref 0.0–0.2)

## 2021-01-13 LAB — C-REACTIVE PROTEIN: CRP: 0.9 mg/dL (ref <1.0)

## 2021-01-13 LAB — FERRITIN: Ferritin: 83 ng/mL (ref 11–307)

## 2021-01-13 LAB — D-DIMER, QUANTITATIVE: D-Dimer, Quant: 1.39 ug/mL-FEU — ABNORMAL HIGH (ref 0.00–0.50)

## 2021-01-13 MED ORDER — ASPIRIN EC 81 MG PO TBEC
81.0000 mg | DELAYED_RELEASE_TABLET | Freq: Two times a day (BID) | ORAL | Status: DC
  Administered 2021-01-13 – 2021-01-17 (×9): 81 mg via ORAL
  Filled 2021-01-13 (×9): qty 1

## 2021-01-13 MED ORDER — ONDANSETRON HCL 4 MG/2ML IJ SOLN
4.0000 mg | Freq: Four times a day (QID) | INTRAMUSCULAR | Status: DC | PRN
  Administered 2021-01-13: 4 mg via INTRAVENOUS
  Filled 2021-01-13: qty 2

## 2021-01-13 MED ORDER — MEMANTINE HCL 10 MG PO TABS
10.0000 mg | ORAL_TABLET | Freq: Two times a day (BID) | ORAL | Status: DC
  Administered 2021-01-13 – 2021-01-17 (×9): 10 mg via ORAL
  Filled 2021-01-13 (×9): qty 1

## 2021-01-13 MED ORDER — ZINC SULFATE 220 (50 ZN) MG PO CAPS
220.0000 mg | ORAL_CAPSULE | Freq: Every day | ORAL | Status: DC
  Administered 2021-01-13 – 2021-01-17 (×5): 220 mg via ORAL
  Filled 2021-01-13 (×5): qty 1

## 2021-01-13 MED ORDER — HYDROCOD POLST-CPM POLST ER 10-8 MG/5ML PO SUER
5.0000 mL | Freq: Two times a day (BID) | ORAL | Status: DC | PRN
Start: 2021-01-13 — End: 2021-01-18
  Administered 2021-01-14: 5 mL via ORAL
  Filled 2021-01-13: qty 5

## 2021-01-13 MED ORDER — SODIUM CHLORIDE 0.9 % IV SOLN
100.0000 mg | Freq: Every day | INTRAVENOUS | Status: DC
Start: 2021-01-14 — End: 2021-01-13

## 2021-01-13 MED ORDER — ASCORBIC ACID 500 MG PO TABS
500.0000 mg | ORAL_TABLET | Freq: Every day | ORAL | Status: DC
  Administered 2021-01-13 – 2021-01-17 (×5): 500 mg via ORAL
  Filled 2021-01-13 (×5): qty 1

## 2021-01-13 MED ORDER — PANTOPRAZOLE SODIUM 40 MG PO TBEC
80.0000 mg | DELAYED_RELEASE_TABLET | Freq: Every day | ORAL | Status: DC
  Administered 2021-01-14 – 2021-01-17 (×4): 80 mg via ORAL
  Filled 2021-01-13 (×4): qty 2

## 2021-01-13 MED ORDER — FLUDROCORTISONE ACETATE 0.1 MG PO TABS
100.0000 ug | ORAL_TABLET | Freq: Every day | ORAL | Status: DC
  Administered 2021-01-14 – 2021-01-17 (×4): 100 ug via ORAL
  Filled 2021-01-13 (×4): qty 1

## 2021-01-13 MED ORDER — SODIUM CHLORIDE 0.9 % IV SOLN
100.0000 mg | INTRAVENOUS | Status: DC
  Administered 2021-01-14: 100 mg via INTRAVENOUS
  Filled 2021-01-13: qty 20

## 2021-01-13 MED ORDER — IPRATROPIUM-ALBUTEROL 20-100 MCG/ACT IN AERS
1.0000 | INHALATION_SPRAY | Freq: Four times a day (QID) | RESPIRATORY_TRACT | Status: DC
  Administered 2021-01-13 – 2021-01-17 (×14): 1 via RESPIRATORY_TRACT
  Filled 2021-01-13: qty 4

## 2021-01-13 MED ORDER — LORATADINE 10 MG PO TABS
10.0000 mg | ORAL_TABLET | Freq: Every day | ORAL | Status: DC
  Administered 2021-01-14 – 2021-01-17 (×4): 10 mg via ORAL
  Filled 2021-01-13 (×4): qty 1

## 2021-01-13 MED ORDER — HYDRALAZINE HCL 25 MG PO TABS: 25.0000 mg | ORAL_TABLET | Freq: Three times a day (TID) | ORAL | Status: DC | PRN

## 2021-01-13 MED ORDER — ACETAMINOPHEN 325 MG PO TABS: 650.0000 mg | ORAL_TABLET | Freq: Four times a day (QID) | ORAL | Status: DC | PRN

## 2021-01-13 MED ORDER — FUROSEMIDE 20 MG PO TABS
20.0000 mg | ORAL_TABLET | ORAL | Status: DC
  Administered 2021-01-15: 20 mg via ORAL
  Filled 2021-01-13 (×2): qty 1

## 2021-01-13 MED ORDER — SODIUM CHLORIDE 0.9 % IV SOLN
200.0000 mg | Freq: Once | INTRAVENOUS | Status: AC
  Administered 2021-01-13: 200 mg via INTRAVENOUS
  Filled 2021-01-13: qty 40

## 2021-01-13 MED ORDER — ONDANSETRON HCL 4 MG PO TABS: 4.0000 mg | ORAL_TABLET | Freq: Four times a day (QID) | ORAL | Status: DC | PRN

## 2021-01-13 MED ORDER — ACETAMINOPHEN 650 MG RE SUPP: 650.0000 mg | Freq: Four times a day (QID) | RECTAL | Status: DC | PRN

## 2021-01-13 MED ORDER — DOCUSATE SODIUM 100 MG PO CAPS
100.0000 mg | ORAL_CAPSULE | Freq: Two times a day (BID) | ORAL | Status: DC
  Administered 2021-01-13 – 2021-01-17 (×9): 100 mg via ORAL
  Filled 2021-01-13 (×9): qty 1

## 2021-01-13 MED ORDER — PYRIDOSTIGMINE BROMIDE 60 MG PO TABS
60.0000 mg | ORAL_TABLET | Freq: Three times a day (TID) | ORAL | Status: DC
  Administered 2021-01-13 – 2021-01-17 (×14): 60 mg via ORAL
  Filled 2021-01-13 (×15): qty 1

## 2021-01-13 MED ORDER — DEXAMETHASONE 4 MG PO TABS
6.0000 mg | ORAL_TABLET | ORAL | Status: DC
  Administered 2021-01-13 – 2021-01-17 (×5): 6 mg via ORAL
  Filled 2021-01-13 (×5): qty 1

## 2021-01-13 MED ORDER — FLUOXETINE HCL 20 MG PO CAPS
40.0000 mg | ORAL_CAPSULE | Freq: Every day | ORAL | Status: DC
  Administered 2021-01-14 – 2021-01-17 (×4): 40 mg via ORAL
  Filled 2021-01-13 (×4): qty 2

## 2021-01-13 MED ORDER — ARIPIPRAZOLE 2 MG PO TABS
2.0000 mg | ORAL_TABLET | Freq: Every day | ORAL | Status: DC
  Administered 2021-01-13 – 2021-01-17 (×5): 2 mg via ORAL
  Filled 2021-01-13 (×5): qty 1

## 2021-01-13 MED ORDER — SODIUM CHLORIDE 0.9 % IV SOLN: 200.0000 mg | Freq: Once | INTRAVENOUS | Status: DC

## 2021-01-13 MED ORDER — POLYETHYLENE GLYCOL 3350 17 G PO PACK: 17.0000 g | PACK | Freq: Every day | ORAL | Status: DC | PRN

## 2021-01-13 MED ORDER — GUAIFENESIN-DM 100-10 MG/5ML PO SYRP: 10.0000 mL | ORAL_SOLUTION | ORAL | Status: DC | PRN

## 2021-01-13 MED ORDER — ENOXAPARIN SODIUM 40 MG/0.4ML IJ SOSY
40.0000 mg | PREFILLED_SYRINGE | INTRAMUSCULAR | Status: DC
  Administered 2021-01-13 – 2021-01-17 (×5): 40 mg via SUBCUTANEOUS
  Filled 2021-01-13 (×5): qty 0.4

## 2021-01-13 MED ORDER — TRAMADOL HCL 50 MG PO TABS: 50.0000 mg | ORAL_TABLET | Freq: Two times a day (BID) | ORAL | Status: DC | PRN

## 2021-01-13 NOTE — ED Triage Notes (Addendum)
Patient BIB [facility] c/o covid exposure, SOB, and fever (100.4) started yesterday.  Patient was given tylenol by caregiver.  Patient is very diaphoretic per EMS.  Patient is bed bound at baseline. Patient arrived with a yellow DNR.  Patient's husband tested positive this morning on a home test.  98.8-temp 88% on room air, placed on 6L via [place] with improvement to 96%  62-HR 169/58-BP 116-CBG

## 2021-01-13 NOTE — ED Notes (Signed)
Handoff report given to Rito Mlo, RN on 4W.

## 2021-01-13 NOTE — ED Provider Notes (Signed)
[facility] DEPT Provider Note   CSN: [id number] Arrival date & time: 01/13/21  1124     History Chief Complaint  Patient presents with   Fever   Covid Exposure   Shortness of Breath    Sabrina Page is a 87 y.o. female.  87 yo F with a chief complaint of cough and shortness of breath.  Going on since yesterday.  Her husband tested positive for COVID.  Symptoms worsened this morning.  Hypoxic with EMS.  The history is provided by the patient. The history is limited by a language barrier. A language interpreter was used.  Fever Associated symptoms: cough   Associated symptoms: no chest pain, no chills, no congestion, no dysuria, no headaches, no myalgias, no nausea, no rhinorrhea and no vomiting   Shortness of Breath Associated symptoms: cough and fever   Associated symptoms: no chest pain, no headaches, no vomiting and no wheezing   Illness Severity:  Moderate Onset quality:  Gradual Duration:  1 day Timing:  Constant Progression:  Worsening Chronicity:  New Associated symptoms: cough, fever and shortness of breath   Associated symptoms: no chest pain, no congestion, no headaches, no myalgias, no nausea, no rhinorrhea, no vomiting and no wheezing       Past Medical History:  Diagnosis Date   Allergy    rhinitis   Asthma    Chronic kidney disease    hx of cystitis    COPD (chronic obstructive pulmonary disease) (HCC)    Depression    Dizziness    vertigo    Gallstones    GERD (gastroesophageal reflux disease)    Hyperlipidemia    Hypertension    Obesity    Osteoarthritis    Osteoporosis    Peripheral vascular disease (HCC)    swelling    Pneumonia    Shortness of breath    with exertion    Shoulder injury    left   Sleep apnea    never diagnosed    Urinary incontinence     Patient Active Problem List   Diagnosis Date Noted   Pneumonia due to COVID-19 virus 01/13/2021   Acute respiratory failure with hypoxia  ([place]) 01/13/2021   Acute on chronic diastolic congestive heart failure ([place]) 11/17/2019   UTI (urinary tract infection) 05/29/2019   Urinary incontinence 11/01/2018   Seborrheic dermatitis 06/14/2018   Chronic respiratory failure with hypoxia ([place]) 04/14/2018   Alzheimer disease ([place]) 06/10/2017   Orthostatic hypotension dysautonomic syndrome 03/20/2017   Knee pain, right 12/27/2016   Closed right hip fracture ([place]) 04/15/2016   Hip fracture ([place]) 04/15/2016   Syncope due to orthostatic hypotension 01/18/2016   Orthostatic hypotension 01/18/2016   Hip pain, chronic 12/25/2015   Carotid bruit 06/26/2015   Fall at home 02/01/2015   Concussion without loss of consciousness 09/13/2014   Morbid obesity ([place]) 03/06/2014   Right tennis elbow 01/02/2014   Wart 01/02/2014   Chronic fatigue 06/25/2013   External hemorrhoid, bleeding 02/14/2013   Obstructive sleep apnea 02/10/2012   Low back pain radiating to both legs 05/14/2011   Falls frequently 05/14/2011   Weakness 04/22/2011   Hyperglycemia 04/22/2011   Neoplasm of uncertain behavior of skin 01/11/2011   Anxiety state 07/06/2010   SHOULDER PAIN 05/21/2010   CONCUSSION WITH LOSS OF CONSCIOUSNESS 05/21/2010   Actinic keratosis 04/08/2010   Carpal tunnel syndrome 11/28/2009   NECK PAIN 11/28/2009   DIZZINESS 11/28/2009   INSOMNIA, CHRONIC 06/23/2009   TOBACCO USE, QUIT  04/11/2009   DIVERTICULOSIS OF COLON 09/06/2008   DIVERTICULITIS OF COLON 09/06/2008   Unspecified chest pain 09/06/2008   GERD 06/05/2008   Osteoarthritis 06/05/2008   ABNORMAL GLUCOSE NEC 06/05/2008   BRONCHITIS, ACUTE 04/04/2008   APHTHOUS STOMATITIS 03/25/2008   Venous (peripheral) insufficiency 11/03/2007   Edema 11/03/2007   SHINGLES 10/23/2007   DENTAL PAIN 10/23/2007   PARESTHESIA 10/23/2007   Pain in limb 07/06/2007   HYPERLIPIDEMIA 02/17/2007   Depression 02/17/2007   Essential hypertension 02/17/2007   Allergic rhinitis 02/17/2007   Asthma,  moderate persistent, well-controlled 02/17/2007   Dyspnea on exertion 02/17/2007   SYMPTOM, INCONTINENCE, URGE 02/17/2007    Past Surgical History:  Procedure Laterality Date   CHOLECYSTECTOMY  09/14/2011   Procedure: LAPAROSCOPIC CHOLECYSTECTOMY;  Surgeon: Odis Hollingshead, MD;  Location: [facility]S;  Service: General;  Laterality: N/A;  attempted intraoperative cholangiogram    HIP ARTHROPLASTY Right 04/17/2016   Procedure: ARTHROPLASTY BIPOLAR HIP (HEMIARTHROPLASTY);  Surgeon: Paralee Cancel, MD;  Location: [facility];  Service: Orthopedics;  Laterality: Right;   L TKR     LIVER BIOPSY  09/14/2011   Procedure: LIVER BIOPSY;  Surgeon: Odis Hollingshead, MD;  Location: [facility];  Service: General;  Laterality: N/A;   OTHER SURGICAL HISTORY     left leg surgery    OTHER SURGICAL HISTORY     left breast surgery due to mastitis    right left knee arthroplast       OB History   No obstetric history on file.     Family History  Problem Relation Age of Onset   Hypertension Mother    Arthritis Mother    Diabetes Mother    Stroke Mother    Stroke Father    Hypertension Father    Hypertension Other    CAD Neg Hx     Social History   Tobacco Use   Smoking status: Former    Packs/day: 0.50    Years: 35.00    Pack years: 17.50    Types: Cigarettes    Quit date: 05/24/2001    Years since quitting: 19.6   Smokeless tobacco: Never  Vaping Use   Vaping Use: Never used  Substance Use Topics   Alcohol use: Yes    Comment: socially    Drug use: No    Home Medications Prior to Admission medications   Medication Sig Start Date End Date Taking? Authorizing Provider  acetaminophen (TYLENOL) 500 MG tablet Take 500 mg by mouth every 6 (six) hours as needed for headache (pain).   Yes [provider]  ARIPiprazole (ABILIFY) 2 MG tablet TAKE 1 TABLET BY MOUTH EVERY DAY Patient taking differently: Take 2 mg by mouth daily. 12/15/20  Yes Plotnikov, Evie Lacks, MD  aspirin 81 MG EC tablet  Take 1 tablet (81 mg total) by mouth 2 (two) times daily. 01/06/21  Yes Plotnikov, Evie Lacks, MD  B Complex-Folic Acid (B COMPLEX PLUS) TABS Take 1 tablet by mouth daily. 01/06/21  Yes Plotnikov, Evie Lacks, MD  Calcium-Vitamin D-Vitamin K 500-200-90 MG-UNT-MCG TABS 1 po qd Patient taking differently: Take 1 tablet by mouth daily. 01/06/21  Yes Plotnikov, Evie Lacks, MD  cephALEXin (KEFLEX) 500 MG capsule Take 500 mg by mouth in the morning and at bedtime.   Yes [provider]  docusate sodium (STOOL SOFTENER) 100 MG capsule Take 1 capsule (100 mg total) by mouth 2 (two) times daily. Patient taking differently: Take 100 mg by mouth every other day. 01/06/21  Yes Plotnikov, Evie Lacks, MD  fludrocortisone (FLORINEF) 0.1 MG tablet Take 1 tablet (100 mcg total) by mouth daily. Annual appt due in Oct must see provider for future refills Patient taking differently: Take 100 mcg by mouth daily. 01/06/21  Yes Plotnikov, Evie Lacks, MD  FLUoxetine (PROZAC) 40 MG capsule TAKE 1 CAPSULE BY MOUTH EVERY DAY Patient taking differently: Take 40 mg by mouth daily. 01/06/21  Yes Plotnikov, Evie Lacks, MD  furosemide (LASIX) 40 MG tablet TAKE 1 TABLET (40 MG TOTAL) BY MOUTH DAILY AS NEEDED FOR EDEMA. Patient taking differently: Take 20 mg by mouth every other day. 02/16/20 02/15/21 Yes Plotnikov, Evie Lacks, MD  loratadine (CLARITIN) 10 MG tablet TAKE 1 TABLET BY MOUTH EVERY DAY IN THE MORNING Patient taking differently: Take 10 mg by mouth daily. 01/06/21  Yes Plotnikov, Evie Lacks, MD  memantine (NAMENDA) 10 MG tablet Take 1 tablet (10 mg total) by mouth 2 (two) times daily. Annual appt due in Oct must see provider for future refills 01/06/21  Yes Plotnikov, Evie Lacks, MD  midodrine (PROAMATINE) 10 MG tablet Take 1 tablet (10 mg total) by mouth 3 (three) times daily. Patient taking differently: Take 5 mg by mouth in the morning and at bedtime. May also take an additional '5mg'$  if BP gets too low 01/06/21  Yes Plotnikov,  Evie Lacks, MD  Multiple Vitamins-Minerals (ZINC PO) Take 1 tablet by mouth daily.   Yes [provider]  nystatin cream (MYCOSTATIN) Apply 1 application topically daily as needed for dry skin (rash/irritation/itching). 01/06/21  Yes Plotnikov, Evie Lacks, MD  omeprazole (PRILOSEC) 20 MG capsule Take 2 capsules (40 mg total) by mouth daily. 01/06/21  Yes Plotnikov, Evie Lacks, MD  OVER THE COUNTER MEDICATION Apply 1 application topically daily as needed (knee pain). Hemp Freeze   Yes [provider]  potassium chloride (KLOR-CON) 8 MEQ tablet Take 1 tablet (8 mEq total) by mouth every morning. 01/06/21  Yes Plotnikov, Evie Lacks, MD  pyridostigmine (MESTINON) 60 MG tablet Take 1 tablet (60 mg total) by mouth 3 (three) times daily. 01/06/21  Yes Plotnikov, Evie Lacks, MD  traMADol (ULTRAM) 50 MG tablet Take 1 tablet (50 mg total) by mouth every 12 (twelve) hours as needed for severe pain. 09/20/17  Yes Plotnikov, Evie Lacks, MD    Allergies    Atorvastatin, Enalapril maleate, Hctz [hydrochlorothiazide], Lovastatin, Namenda [memantine hcl], Simvastatin, and Topamax [topiramate]  Review of Systems   Review of Systems  Constitutional:  Positive for fever. Negative for chills.  HENT:  Negative for congestion and rhinorrhea.   Eyes:  Negative for redness and visual disturbance.  Respiratory:  Positive for cough and shortness of breath. Negative for wheezing.   Cardiovascular:  Negative for chest pain and palpitations.  Gastrointestinal:  Negative for nausea and vomiting.  Genitourinary:  Negative for dysuria and urgency.  Musculoskeletal:  Negative for arthralgias and myalgias.  Skin:  Negative for pallor and wound.  Neurological:  Negative for dizziness and headaches.   Physical Exam Updated Vital Signs BP (!) 172/48   Pulse (!) 57   Temp 99.7 F (37.6 C) (Oral)   Resp (!) 21   SpO2 98%   Physical Exam Vitals and nursing note reviewed.  Constitutional:      General: She is not  in acute distress.    Appearance: She is well-developed. She is not diaphoretic.     Comments: Chronically ill-appearing  HENT:     Head: Normocephalic and atraumatic.  Eyes:  Pupils: Pupils are equal, round, and reactive to light.  Cardiovascular:     Rate and Rhythm: Normal rate and regular rhythm.     Heart sounds: No murmur heard.   No friction rub. No gallop.  Pulmonary:     Effort: Pulmonary effort is normal.     Breath sounds: No wheezing or rales.  Abdominal:     General: There is no distension.     Palpations: Abdomen is soft.     Tenderness: There is no abdominal tenderness.  Musculoskeletal:        General: No tenderness.     Cervical back: Normal range of motion and neck supple.  Skin:    General: Skin is warm and dry.  Neurological:     Mental Status: She is alert and oriented to person, place, and time.  Psychiatric:        Behavior: Behavior normal.    ED Results / Procedures / Treatments   Labs (all labs ordered are listed, but only abnormal results are displayed) Labs Reviewed  RESP PANEL BY RT-PCR (FLU A&B, COVID) ARPGX2 - Abnormal; Notable for the following components:      Result Value   SARS Coronavirus 2 by RT PCR POSITIVE (*)    All other components within normal limits  CBC WITH DIFFERENTIAL/PLATELET - Abnormal; Notable for the following components:   WBC 3.8 (*)    Platelets 122 (*)    All other components within normal limits  COMPREHENSIVE METABOLIC PANEL - Abnormal; Notable for the following components:   Sodium 134 (*)    Chloride 96 (*)    Calcium 8.4 (*)    Total Protein 6.2 (*)    Albumin 3.3 (*)    All other components within normal limits    EKG EKG Interpretation  Date/Time:  Tuesday January 13 2021 11:44:40 EDT Ventricular Rate:  58 PR Interval:  178 QRS Duration: 88 QT Interval:  471 QTC Calculation: 463 R Axis:   12 Text Interpretation: Sinus rhythm Abnormal R-wave progression, early transition No significant change  since last tracing Confirmed by Deno Etienne [phone]) on 01/13/2021 12:40:58 PM  Radiology DG Chest Port 1 View  Result Date: 01/13/2021 CLINICAL DATA:  Shortness of breath.  COVID positive. EXAM: PORTABLE CHEST 1 VIEW COMPARISON:  08/27/2020 and 04/18/2016. FINDINGS: Single view of the chest was obtained. There is vague opacity in the right upper chest just lateral to the anterior right first rib. This appears to be a chronic finding based on a chest radiograph from 2017. Heart size is within normal limits. No focal airspace disease or lung consolidation. Patient is slightly rotated on this examination. IMPRESSION: 1. No acute cardiopulmonary disease. Electronically Signed   By: Markus Daft M.D.   On: 01/13/2021 13:14    Procedures Procedures   Medications Ordered in ED Medications  remdesivir 200 mg in sodium chloride 0.9% 250 mL IVPB (has no administration in time range)    Followed by  remdesivir 100 mg in sodium chloride 0.9 % 100 mL IVPB (has no administration in time range)    ED Course  I have reviewed the triage vital signs and the nursing notes.  Pertinent labs & imaging results that were available during my care of the patient were reviewed by me and considered in my medical decision making (see chart for details).    MDM Rules/Calculators/A&P  87 yo F with a chief complaint of fever cough and shortness of breath.  Started yesterday.  Husband has similar symptoms and tested positive for COVID.  Hypoxic with EMS.  Patient not hypoxic here but in the very low 90s with no movement at all.  Suspect she is probably hypoxic with very minimal movement.  She is bedbound at baseline.  Will obtain a laboratory evaluation chest x-ray.  Chest x-ray without focal infiltrate is viewed by me.  Blood work without significant finding no significant anemia no significant lecture light abnormality.  Patient likely has COVID as her husband just is positive this morning and is  requiring oxygen not on oxygen at baseline.  Will discuss with hospitalist for admission.  CRITICAL CARE Performed by: Cecilio Asper   Total critical care time: 35 minutes  Critical care time was exclusive of separately billable procedures and treating other patients.  Critical care was necessary to treat or prevent imminent or life-threatening deterioration.  Critical care was time spent personally by me on the following activities: development of treatment plan with patient and/or surrogate as well as nursing, discussions with consultants, evaluation of patient's response to treatment, examination of patient, obtaining history from patient or surrogate, ordering and performing treatments and interventions, ordering and review of laboratory studies, ordering and review of radiographic studies, pulse oximetry and re-evaluation of patient's condition.   Final Clinical Impression(s) / ED Diagnoses Final diagnoses:  COVID-19 virus infection  Acute respiratory failure with hypoxia [facility])    Rx / DC Orders ED Discharge Orders     None        Deno Etienne, DO 01/13/21 1542

## 2021-01-13 NOTE — H&P (Signed)
History and Physical    Sabrina Page [name] DOB: 08/23/1933 DOA: 01/13/2021  PCP: Cassandria Anger, MD  Patient coming from:   I have personally briefly reviewed patient's old medical records in [place]  Chief Complaint:   HPI: Sabrina Page is a 87 y.o. 58 female with medical history significant of Alzheimer Dementia, OA, chronic low back pain, morbid obesity, adult failure to thrive, COPD/asthma, chronic diastolic congestive failure, essential hypertension, hyperlipidemia who has been bedbound for the last 1-2 months who presents to [facility] ED with fever, progressive shortness of breath, cough.  Positive contact with COVID infected individual (Husband).  Interview assisted with video interpreter, Dmitriy [phone].  Patient also with history of dementia and very poor historian.  Patient reports onset of shortness of breath with nonproductive cough over the last 3 days.  Reports her husband that she lives with at home also recently positive for COVID-19.  Patient denies headache, no chest pain, no abdominal pain, no nausea/vomiting/diarrhea.   ED Course: Temperature 99.7 F, HR 52, RR 20, BP 171/68, SPO2 85% on room air, placed on 2 L nasal cannula with SPO2 97%.  WBCs 3.8, hemoglobin 13.7, platelets 122.  Sodium 134, potassium 3.7, chloride 96, CO2 28, BUN 13, creatinine 0.87, glucose 90.  COVID-19 PCR positive.  Influenza A/B PCR negative.  High sensitive troponin 4.  Chest x-ray with no acute cardiopulmonary disease process.  Duration consulted for further evaluation and management of acute hypoxic respite failure secondary to COVID-19 viral pneumonia.  Review of Systems:  Constitutional - No Fatigue, No Weight Loss Vision - No impaired vision, decreased visual acuity Ear/Nose/Mouth/Throat - No decreased hearing, no congestion Respiratory - + shortness of breath, no exertional dyspnea, + cough Cardiovascular - No chest pain, no palpitations, no  peripheral edema Gastrointestinal - No nausea, no diarrhea, no constipation, Genitourinary - No excessive urination, no urinary incontinence Integumentary - No rashes or concerning skin lesions Neurologic - No numbness, no tingling, no dizziness, no headaches  Past Medical History:  Diagnosis Date   Allergy    rhinitis   Asthma    Chronic kidney disease    hx of cystitis    COPD (chronic obstructive pulmonary disease) (HCC)    Depression    Dizziness    vertigo    Gallstones    GERD (gastroesophageal reflux disease)    Hyperlipidemia    Hypertension    Obesity    Osteoarthritis    Osteoporosis    Peripheral vascular disease (HCC)    swelling    Pneumonia    Shortness of breath    with exertion    Shoulder injury    left   Sleep apnea    never diagnosed    Urinary incontinence     Past Surgical History:  Procedure Laterality Date   CHOLECYSTECTOMY  09/14/2011   Procedure: LAPAROSCOPIC CHOLECYSTECTOMY;  Surgeon: Odis Hollingshead, MD;  Location: [facility] ORS;  Service: General;  Laterality: N/A;  attempted intraoperative cholangiogram    HIP ARTHROPLASTY Right 04/17/2016   Procedure: ARTHROPLASTY BIPOLAR HIP (HEMIARTHROPLASTY);  Surgeon: Paralee Cancel, MD;  Location: [facility] ORS;  Service: Orthopedics;  Laterality: Right;   L TKR     LIVER BIOPSY  09/14/2011   Procedure: LIVER BIOPSY;  Surgeon: Odis Hollingshead, MD;  Location: [facility] ORS;  Service: General;  Laterality: N/A;   OTHER SURGICAL HISTORY     left leg surgery    OTHER SURGICAL HISTORY     left breast  surgery due to mastitis    right left knee arthroplast       reports that she quit smoking about 19 years ago. Her smoking use included cigarettes. She has a 17.50 pack-year smoking history. She has never used smokeless tobacco. She reports current alcohol use. She reports that she does not use drugs.  Allergies  Allergen Reactions   Atorvastatin Other (See Comments)    REACTION: upset stomach   Enalapril Maleate Other  (See Comments)    Unknown reaction   Hctz [Hydrochlorothiazide] Other (See Comments)    dizziness   Lovastatin Other (See Comments)    Unknown reaction   Namenda [Memantine Hcl]     Feeling bad   Simvastatin Other (See Comments)    REACTION: mouth sores   Topamax [Topiramate] Other (See Comments)    syncope    Family History  Problem Relation Age of Onset   Hypertension Mother    Arthritis Mother    Diabetes Mother    Stroke Mother    Stroke Father    Hypertension Father    Hypertension Other    CAD Neg Hx     Family history reviewed and not pertinent   Prior to Admission medications   Medication Sig Start Date End Date Taking? Authorizing Provider  acetaminophen (TYLENOL) 500 MG tablet Take 500 mg by mouth every 6 (six) hours as needed for headache (pain).   Yes [provider]  ARIPiprazole (ABILIFY) 2 MG tablet TAKE 1 TABLET BY MOUTH EVERY DAY Patient taking differently: Take 2 mg by mouth daily. 12/15/20  Yes Plotnikov, Evie Lacks, MD  aspirin 81 MG EC tablet Take 1 tablet (81 mg total) by mouth 2 (two) times daily. 01/06/21  Yes Plotnikov, Evie Lacks, MD  B Complex-Folic Acid (B COMPLEX PLUS) TABS Take 1 tablet by mouth daily. 01/06/21  Yes Plotnikov, Evie Lacks, MD  Calcium-Vitamin D-Vitamin K 500-200-90 MG-UNT-MCG TABS 1 po qd Patient taking differently: Take 1 tablet by mouth daily. 01/06/21  Yes Plotnikov, Evie Lacks, MD  cephALEXin (KEFLEX) 500 MG capsule Take 500 mg by mouth in the morning and at bedtime.   Yes [provider]  docusate sodium (STOOL SOFTENER) 100 MG capsule Take 1 capsule (100 mg total) by mouth 2 (two) times daily. Patient taking differently: Take 100 mg by mouth every other day. 01/06/21  Yes Plotnikov, Evie Lacks, MD  fludrocortisone (FLORINEF) 0.1 MG tablet Take 1 tablet (100 mcg total) by mouth daily. Annual appt due in Oct must see provider for future refills Patient taking differently: Take 100 mcg by mouth daily. 01/06/21  Yes  Plotnikov, Evie Lacks, MD  FLUoxetine (PROZAC) 40 MG capsule TAKE 1 CAPSULE BY MOUTH EVERY DAY Patient taking differently: Take 40 mg by mouth daily. 01/06/21  Yes Plotnikov, Evie Lacks, MD  furosemide (LASIX) 40 MG tablet TAKE 1 TABLET (40 MG TOTAL) BY MOUTH DAILY AS NEEDED FOR EDEMA. Patient taking differently: Take 20 mg by mouth every other day. 02/16/20 02/15/21 Yes Plotnikov, Evie Lacks, MD  loratadine (CLARITIN) 10 MG tablet TAKE 1 TABLET BY MOUTH EVERY DAY IN THE MORNING Patient taking differently: Take 10 mg by mouth daily. 01/06/21  Yes Plotnikov, Evie Lacks, MD  memantine (NAMENDA) 10 MG tablet Take 1 tablet (10 mg total) by mouth 2 (two) times daily. Annual appt due in Oct must see provider for future refills 01/06/21  Yes Plotnikov, Evie Lacks, MD  midodrine (PROAMATINE) 10 MG tablet Take 1 tablet (10 mg total) by mouth  3 (three) times daily. Patient taking differently: Take 5 mg by mouth in the morning and at bedtime. May also take an additional '5mg'$  if BP gets too low 01/06/21  Yes Plotnikov, Evie Lacks, MD  Multiple Vitamins-Minerals (ZINC PO) Take 1 tablet by mouth daily.   Yes [provider]  nystatin cream (MYCOSTATIN) Apply 1 application topically daily as needed for dry skin (rash/irritation/itching). 01/06/21  Yes Plotnikov, Evie Lacks, MD  omeprazole (PRILOSEC) 20 MG capsule Take 2 capsules (40 mg total) by mouth daily. 01/06/21  Yes Plotnikov, Evie Lacks, MD  OVER THE COUNTER MEDICATION Apply 1 application topically daily as needed (knee pain). Hemp Freeze   Yes [provider]  potassium chloride (KLOR-CON) 8 MEQ tablet Take 1 tablet (8 mEq total) by mouth every morning. 01/06/21  Yes Plotnikov, Evie Lacks, MD  pyridostigmine (MESTINON) 60 MG tablet Take 1 tablet (60 mg total) by mouth 3 (three) times daily. 01/06/21  Yes Plotnikov, Evie Lacks, MD  traMADol (ULTRAM) 50 MG tablet Take 1 tablet (50 mg total) by mouth every 12 (twelve) hours as needed for severe pain. 09/20/17  Yes  Plotnikov, Evie Lacks, MD    Physical Exam: Vitals:   01/13/21 1230 01/13/21 1300 01/13/21 1330 01/13/21 1400  BP: (!) 141/86 (!) 167/74 (!) 179/74 (!) 171/68  Pulse: 71 (!) 59 (!) 59 (!) 52  Resp: 14 (!) '21 15 20  '$ Temp:      TempSrc:      SpO2: 97% 97% 97% 97%    Constitutional: NAD, calm, comfortable, pleasantly confused, obese Eyes: PERRL, lids and conjunctivae normal ENMT: Mucous membranes are dry. Posterior pharynx clear of any exudate or lesions.Normal dentition.  Neck: normal, supple, no masses, no thyromegaly Respiratory: clear to auscultation bilaterally, no wheezing, no crackles.  Slightly increased  respiratory effort. No accessory muscle use.  On 2 L nasal cannula with SPO2 97% Cardiovascular: Regular rate and rhythm, no murmurs / rubs / gallops. No extremity edema. 2+ pedal pulses. No carotid bruits.  Abdomen: no tenderness, no masses palpated. No hepatosplenomegaly. Bowel sounds positive.  Musculoskeletal: no clubbing / cyanosis. No joint deformity upper and lower extremities. Good ROM, no contractures. Normal muscle tone.  Skin: no rashes, lesions, ulcers. No induration Neurologic: CN 2-12 grossly intact. Sensation intact, DTR normal. Strength 5/5 in all 4.  Psychiatric: Poor judgment and insight. Alert, not oriented to person/place/time nor situation. Normal mood.    Labs on Admission: I have personally reviewed following labs and imaging studies  CBC: Recent Labs  Lab 01/13/21 1204  WBC 3.8*  NEUTROABS 2.1  HGB 13.7  HCT 42.7  MCV 90.9  PLT [id number]*   Basic Metabolic Panel: Recent Labs  Lab 01/13/21 1204  NA 134*  K 3.7  CL 96*  CO2 28  GLUCOSE 90  BUN 13  CREATININE 0.87  CALCIUM 8.4*   GFR: CrCl cannot be calculated (Unknown ideal weight.). Liver Function Tests: Recent Labs  Lab 01/13/21 1204  AST 19  ALT 15  ALKPHOS 59  BILITOT 0.7  PROT 6.2*  ALBUMIN 3.3*   No results for input(s): LIPASE, AMYLASE in the last 168 hours. No results for  input(s): AMMONIA in the last 168 hours. Coagulation Profile: No results for input(s): INR, PROTIME in the last 168 hours. Cardiac Enzymes: No results for input(s): CKTOTAL, CKMB, CKMBINDEX, TROPONINI in the last 168 hours. BNP (last 3 results) No results for input(s): PROBNP in the last 8760 hours. HbA1C: No results for input(s): HGBA1C in  the last 72 hours. CBG: No results for input(s): GLUCAP in the last 168 hours. Lipid Profile: No results for input(s): CHOL, HDL, LDLCALC, TRIG, CHOLHDL, LDLDIRECT in the last 72 hours. Thyroid Function Tests: No results for input(s): TSH, T4TOTAL, FREET4, T3FREE, THYROIDAB in the last 72 hours. Anemia Panel: No results for input(s): VITAMINB12, FOLATE, FERRITIN, TIBC, IRON, RETICCTPCT in the last 72 hours. Urine analysis:    Component Value Date/Time   COLORURINE AMBER (A) 08/27/2020 1700   APPEARANCEUR HAZY (A) 08/27/2020 1700   LABSPEC 1.027 08/27/2020 1700   PHURINE 5.0 08/27/2020 1700   GLUCOSEU NEGATIVE 08/27/2020 1700   GLUCOSEU NEGATIVE 02/27/2020 0843   HGBUR NEGATIVE 08/27/2020 1700   BILIRUBINUR SMALL (A) 08/27/2020 1700   KETONESUR 5 (A) 08/27/2020 1700   PROTEINUR 30 (A) 08/27/2020 1700   UROBILINOGEN 0.2 02/27/2020 0843   NITRITE NEGATIVE 08/27/2020 1700   LEUKOCYTESUR MODERATE (A) 08/27/2020 1700    Radiological Exams on Admission: DG Chest Port 1 View  Result Date: 01/13/2021 CLINICAL DATA:  Shortness of breath.  COVID positive. EXAM: PORTABLE CHEST 1 VIEW COMPARISON:  08/27/2020 and 04/18/2016. FINDINGS: Single view of the chest was obtained. There is vague opacity in the right upper chest just lateral to the anterior right first rib. This appears to be a chronic finding based on a chest radiograph from 2017. Heart size is within normal limits. No focal airspace disease or lung consolidation. Patient is slightly rotated on this examination. IMPRESSION: 1. No acute cardiopulmonary disease. Electronically Signed   By: Markus Daft M.D.   On: 01/13/2021 13:14    EKG: Independently reviewed.   Assessment/Plan Active Problems:   Depression   Falls frequently   Orthostatic hypotension dysautonomic syndrome   Alzheimer disease ([place])   Pneumonia due to COVID-19 virus   Acute respiratory failure with hypoxia (HCC)    Acute hypoxic respiratory failure secondary to acute Covid-19 viral pneumonia during the ongoing Covid 19 Pandemic - POA Patient presenting to ED with progressive shortness of breath, nonproductive cough in the setting of positive COVID-19 exposure.  COVID-19 PCR positive.  Patient notably hypoxic on EMS arrival with SPO2 95% on room air.  Not oxygen dependent at baseline.  Patient also with low-grade fever 99.7; apparently had fever at home per daughter.  Patient without leukocytosis and chest x-ray with no acute cardiopulmonary disease process.  Patient has received Moderna vaccinations in the past, on 07/26/2019 and 08/16/2019; has not received booster because unable to leave home to receive. --COVID test: 01/13/2021 --CRP pending --ddimer pending --Remdesivir, plan 5-day course (Day #1/5) --Decadron 6 mg p.o. daily x10 days --Continue supplemental oxygen, titrate to maintain SPO2 greater than 92%, on 2 L nasal cannula --Continue supportive care with Combivent MDI prn, vitamin C, zinc, Tylenol, antitussives (benzonatate/ Mucinex/Tussionex) --Follow CBC, CMP, D-dimer, ferritin, and CRP daily --Continue airborne/contact isolation precautions for 10 days from the day of diagnosis  The treatment plan and use of medications and known side effects were discussed with patient/family. Some of the medications used are based on case reports/anecdotal data.  All other medications being used in the management of COVID-19 based on limited study data.  Complete risks and long-term side effects are unknown, however in the best clinical judgment they seem to be of some benefit.  Patient wanted to proceed with treatment  options provided.  COPD/asthma Patient has not oxygen dependent at baseline.  Not on any inhalers at baseline. --Continue supplemental oxygen, maintain SPO2 greater than 88%.  Chronic  diastolic congestive failure, compensated TTE 01/19/2016 with LVEF 0000000, grade 2 diastolic dysfunction.  Not on beta-blocker, ACE/ARB due to orthostatic hypotension dysautonomic syndrome history. --Furosemide 40 mg p.o. every other day --Strict I's and O's and daily weights  Essential hypertension Patient on midodrine '5mg'$  p.o. twice daily.  BP on admission 171/68. --Hold midodrine for now --Continue aspirin 81 mg p.o. twice daily --Monitor BP closely  Alzheimer's dementia Follows with palliative care outpatient, last seen by NP on 12/25/2020. --Namenda 10 mg p.o. twice daily  Orthostatic hypotension dysautonomic syndrome Patient now apparently bedbound at baseline over the last 1-2 months. --Continue Florinef --Holding home midodrine due to elevated BP --Continue monitor BP  OA chronic low back pain --Acetaminophen and tramadol as needed  Anxiety/depression:  --Fluoxetine 40 mg p.o. daily --Abilify 2 mg daily  morbid obesity This complicates all facets of care.    GERD: Continue PPI  Adult failure to thrive Patient is [facility].  She is apparently bedbound at baseline which is new over the last 1-2 months.  Daughter reports that she is extremely afraid of falling; and now refuses to mobilize any further.  Patient lives with husband with 24-7-hour caregivers present.  Utilizes a lift for movement.  Patient does require assistance with all meals per daughter. --Continue supportive measures and resumption of outpatient palliative care on discharge   DVT prophylaxis: Lovenox Code Status: DNR Family Communication: Updated daughter, [name] via telephone Disposition Plan: Anticipate discharge back home with home care/outpatient palliative care to  resume Consults called: None Admission status: Inpatient Level of care: Telemetry   Severity of Illness: The appropriate patient status for this patient is INPATIENT. Inpatient status is judged to be reasonable and necessary in order to provide the required intensity of service to ensure the patient's safety. The patient's presenting symptoms, physical exam findings, and initial radiographic and laboratory data in the context of their chronic comorbidities is felt to place them at high risk for further clinical deterioration. Furthermore, it is not anticipated that the patient will be medically stable for discharge from the hospital within 2 midnights of admission. The following factors support the patient status of inpatient.   " The patient's presenting symptoms include fever, shortness of breath, nonproductive cough " The worrisome physical exam findings include slightly increased respiratory effort, hypoxia " The initial radiographic and laboratory data are worrisome because of low platelet count, slightly low WBC count, positive COVID-19 PCR " The chronic co-morbidities include advanced Alzheimer's dementia, CHF, HTN, anxiety/depression, morbid obesity, COPD/asthma.   * I certify that at the point of admission it is my clinical judgment that the patient will require inpatient hospital care spanning beyond 2 midnights from the point of admission due to high intensity of service, high risk for further deterioration and high frequency of surveillance required.*    Sabrina Page J [place] DO [facility] Available via Epic secure chat 7am-7pm After these hours, please refer to coverage provider listed on [email or web address] 01/13/2021, 2:35 PM

## 2021-01-14 DIAGNOSIS — U071 COVID-19: Secondary | ICD-10-CM | POA: Diagnosis not present

## 2021-01-14 DIAGNOSIS — J9601 Acute respiratory failure with hypoxia: Secondary | ICD-10-CM | POA: Diagnosis not present

## 2021-01-14 DIAGNOSIS — I951 Orthostatic hypotension: Secondary | ICD-10-CM | POA: Diagnosis not present

## 2021-01-14 LAB — COMPREHENSIVE METABOLIC PANEL
ALT: 16 U/L (ref 0–44)
AST: 18 U/L (ref 15–41)
Albumin: 3.4 g/dL — ABNORMAL LOW (ref 3.5–5.0)
Alkaline Phosphatase: 64 U/L (ref 38–126)
Anion gap: 10 (ref 5–15)
BUN: 13 mg/dL (ref 8–23)
CO2: 28 mmol/L (ref 22–32)
Calcium: 8.9 mg/dL (ref 8.9–10.3)
Chloride: 101 mmol/L (ref 98–111)
Creatinine, Ser: 0.75 mg/dL (ref 0.44–1.00)
GFR, Estimated: >60 mL/min (ref >60)
Glucose, Bld: 138 mg/dL — ABNORMAL HIGH (ref 70–99)
Potassium: 4 mmol/L (ref 3.5–5.1)
Sodium: 139 mmol/L (ref 135–145)
Total Bilirubin: 0.6 mg/dL (ref 0.3–1.2)
Total Protein: 6.8 g/dL (ref 6.5–8.1)

## 2021-01-14 LAB — CBC WITH DIFFERENTIAL/PLATELET
Abs Immature Granulocytes: 0.03 10*3/uL (ref 0.00–0.07)
Basophils Absolute: 0 10*3/uL (ref 0.0–0.1)
Basophils Relative: 0 %
Eosinophils Absolute: 0 10*3/uL (ref 0.0–0.5)
Eosinophils Relative: 0 %
HCT: 46.9 % — ABNORMAL HIGH (ref 36.0–46.0)
Hemoglobin: 14.9 g/dL (ref 12.0–15.0)
Immature Granulocytes: 1 %
Lymphocytes Relative: 24 %
Lymphs Abs: 0.6 10*3/uL — ABNORMAL LOW (ref 0.7–4.0)
MCH: 29 pg (ref 26.0–34.0)
MCHC: 31.8 g/dL (ref 30.0–36.0)
MCV: 91.4 fL (ref 80.0–100.0)
Monocytes Absolute: 0.1 10*3/uL (ref 0.1–1.0)
Monocytes Relative: 5 %
Neutro Abs: 1.8 10*3/uL (ref 1.7–7.7)
Neutrophils Relative %: 70 %
Platelets: 129 10*3/uL — ABNORMAL LOW (ref 150–400)
RBC: 5.13 MIL/uL — ABNORMAL HIGH (ref 3.87–5.11)
RDW: 13.1 % (ref 11.5–15.5)
WBC: 2.6 10*3/uL — ABNORMAL LOW (ref 4.0–10.5)
nRBC: 0 % (ref 0.0–0.2)

## 2021-01-14 LAB — D-DIMER, QUANTITATIVE: D-Dimer, Quant: 1.1 ug/mL-FEU — ABNORMAL HIGH (ref 0.00–0.50)

## 2021-01-14 LAB — FERRITIN: Ferritin: 88 ng/mL (ref 11–307)

## 2021-01-14 LAB — C-REACTIVE PROTEIN: CRP: 0.6 mg/dL (ref <1.0)

## 2021-01-14 NOTE — Progress Notes (Signed)
Progress Note    Sabrina Page  [name] DOB: [birth year]  DOA: 01/13/2021 PCP: Cassandria Anger, MD    Brief Narrative:     Medical records reviewed and are as summarized below:  Sabrina Page is an 87 y.o. female with medical history significant of Alzheimer Dementia, OA, chronic low back pain, morbid obesity, adult failure to thrive, COPD/asthma, chronic diastolic congestive failure, essential hypertension, hyperlipidemia who has been bedbound for the last 1-2 months who presents to [facility] ED with fever, progressive shortness of breath, cough.  Positive contact with COVID infected individual (Husband).    Assessment/Plan:   Active Problems:   Depression   Falls frequently   Orthostatic hypotension dysautonomic syndrome   Alzheimer disease ([place])   Pneumonia due to COVID-19 virus   Acute respiratory failure with hypoxia (HCC)   Acute hypoxic respiratory failure secondary to acute Covid-19 viral pneumonia during the ongoing Covid 19 Pandemic - POA --COVID test: 01/13/2021 --Remdesivir, plan 5-day course (Day #2/5) --Decadron 6 mg p.o. daily x10 days --Continue supplemental oxygen, titrate to maintain SPO2 greater than 92%, on 2 L nasal cannula --Continue supportive care with Combivent MDI prn, vitamin C, zinc, Tylenol, antitussives (benzonatate/ Mucinex/Tussionex) --Follow CBC, CMP, D-dimer, ferritin, and CRP daily --Continue airborne/contact isolation precautions for 10 days from the day of diagnosis  COPD/asthma Patient has not oxygen dependent at baseline.  Not on any inhalers at baseline. --Continue supplemental oxygen, maintain SPO2 greater than 88%.   Chronic diastolic congestive failure, compensated TTE 01/19/2016 with LVEF 0000000, grade 2 diastolic dysfunction.  Not on beta-blocker, ACE/ARB due to orthostatic hypotension dysautonomic syndrome history. --Furosemide 40 mg p.o. every other day --Strict I's and O's and daily weights   Essential  hypertension Patient on midodrine '5mg'$  p.o. twice daily.  BP on admission 171/68. --Hold midodrine for now --Continue aspirin 81 mg p.o. twice daily --Monitor BP closely   Alzheimer's dementia Follows with palliative care outpatient, last seen by NP on 12/25/2020. --Namenda 10 mg p.o. twice daily   Orthostatic hypotension dysautonomic syndrome Patient now apparently bedbound at baseline over the last 1-2 months. --Continue Florinef --Holding home midodrine due to elevated BP --PT eval   OA chronic low back pain --Acetaminophen and tramadol as needed   Anxiety/depression:  --Fluoxetine 40 mg p.o. daily --Abilify 2 mg daily   GERD:  -Continue PPI   Adult failure to thrive Patient is followed by [facility] palliative care outpatient.  She is apparently bedbound at baseline which is new over the last 1-2 months.   -Patient lives with husband with 24-7-hour caregivers present.  Utilizes a lift for movement.  Patient does require assistance with all meals per daughter.   obesity Body mass index is 41.44 kg/m.   Family Communication/Anticipated D/C date and plan/Code Status   DVT prophylaxis: Lovenox ordered. Code Status: DNR Disposition Plan: Status is: Inpatient  Remains inpatient appropriate because:Inpatient level of care appropriate due to severity of illness  Dispo: The patient is from: Home              Anticipated d/c is to: Home              Patient currently is not medically stable to d/c.   Difficult to place patient No         Medical Consultants:   None.   Subjective:   Not hungry  Objective:    Vitals:   01/14/21 0453 01/14/21 1029 01/14/21 1032 01/14/21 1311  BP: (!) 171/82 Marland Kitchen)  157/76  128/71  Pulse: 62 (!) 59 62 61  Resp: '16 16  20  '$ Temp: 98.8 F (37.1 C) 98.1 F (36.7 C)  97.7 F (36.5 C)  TempSrc: Oral Oral  Oral  SpO2: 97% 100% 98% 94%  Weight:      Height:        Intake/Output Summary (Last 24 hours) at 01/14/2021 1330 Last  data filed at 01/14/2021 0455 Gross per 24 hour  Intake --  Output 1000 ml  Net -1000 ml   Filed Weights   01/13/21 1658  Weight: 109.5 kg    Exam:  General: Appearance:    Severely obese female in no acute distress- seen with interpreter     Lungs:     diminished  Heart:    Normal heart rate.    MS:   All extremities are intact.    Neurologic:   Pleasant and cooperative      Data Reviewed:   I have personally reviewed following labs and imaging studies:  Labs: Labs show the following:   Basic Metabolic Panel: Recent Labs  Lab 01/13/21 1204 01/14/21 0415  NA 134* 139  K 3.7 4.0  CL 96* 101  CO2 28 28  GLUCOSE 90 138*  BUN 13 13  CREATININE 0.87 0.75  CALCIUM 8.4* 8.9   GFR Estimated Creatinine Clearance: 62.2 mL/min (by C-G formula based on SCr of 0.75 mg/dL). Liver Function Tests: Recent Labs  Lab 01/13/21 1204 01/14/21 0415  AST 19 18  ALT 15 16  ALKPHOS 59 64  BILITOT 0.7 0.6  PROT 6.2* 6.8  ALBUMIN 3.3* 3.4*   No results for input(s): LIPASE, AMYLASE in the last 168 hours. No results for input(s): AMMONIA in the last 168 hours. Coagulation profile No results for input(s): INR, PROTIME in the last 168 hours.  CBC: Recent Labs  Lab 01/13/21 1204 01/14/21 0415  WBC 3.8* 2.6*  NEUTROABS 2.1 1.8  HGB 13.7 14.9  HCT 42.7 46.9*  MCV 90.9 91.4  PLT 122* 129*   Cardiac Enzymes: No results for input(s): CKTOTAL, CKMB, CKMBINDEX, TROPONINI in the last 168 hours. BNP (last 3 results) No results for input(s): PROBNP in the last 8760 hours. CBG: No results for input(s): GLUCAP in the last 168 hours. D-Dimer: Recent Labs    01/13/21 1730 01/14/21 0415  DDIMER 1.39* 1.10*   Hgb A1c: Recent Labs    01/13/21 1658  HGBA1C 5.5   Lipid Profile: No results for input(s): CHOL, HDL, LDLCALC, TRIG, CHOLHDL, LDLDIRECT in the last 72 hours. Thyroid function studies: No results for input(s): TSH, T4TOTAL, T3FREE, THYROIDAB in the last 72  hours.  Invalid input(s): FREET3 Anemia work up: Recent Labs    01/13/21 1730 01/14/21 0415  FERRITIN 83 88   Sepsis Labs: Recent Labs  Lab 01/13/21 1204 01/14/21 0415  WBC 3.8* 2.6*    Microbiology Recent Results (from the past 240 hour(s))  Resp Panel by RT-PCR (Flu A&B, Covid) Nasopharyngeal Swab     Status: Abnormal   Collection Time: 01/13/21 11:49 AM   Specimen: Nasopharyngeal Swab; Nasopharyngeal(NP) swabs in vial transport medium  Result Value Ref Range Status   SARS Coronavirus 2 by RT PCR POSITIVE (A) NEGATIVE Final    Comment: RESULT CALLED TO, READ BACK BY AND VERIFIED WITH: JACOB,D. RN '@1404'$  ON 08.23.2022 BY COHEN,K (NOTE) SARS-CoV-2 target nucleic acids are DETECTED.  The SARS-CoV-2 RNA is generally detectable in upper respiratory specimens during the acute phase of infection. Positive results are  indicative of the presence of the identified virus, but do not rule out bacterial infection or co-infection with other pathogens not detected by the test. Clinical correlation with patient history and other diagnostic information is necessary to determine patient infection status. The expected result is Negative.  Fact Sheet for Patients: EntrepreneurPulse.com.au  Fact Sheet for Healthcare Providers: IncredibleEmployment.be  This test is not yet approved or cleared by the [place] FDA and  has been authorized for detection and/or diagnosis of SARS-CoV-2 by FDA under an Emergency Use Authorization (EUA).  This EUA will remain in effect (meaning this te st can be used) for the duration of  the COVID-19 declaration under Section 564(b)(1) of the Act, 21 U.S.C. section 360bbb-3(b)(1), unless the authorization is terminated or revoked sooner.     Influenza A by PCR NEGATIVE NEGATIVE Final   Influenza B by PCR NEGATIVE NEGATIVE Final    Comment: (NOTE) The Xpert Xpress SARS-CoV-2/FLU/RSV plus assay is intended as an  aid in the diagnosis of influenza from Nasopharyngeal swab specimens and should not be used as a sole basis for treatment. Nasal washings and aspirates are unacceptable for Xpert Xpress SARS-CoV-2/FLU/RSV testing.  Fact Sheet for Patients: EntrepreneurPulse.com.au  Fact Sheet for Healthcare Providers: IncredibleEmployment.be  This test is not yet approved or cleared by the [place] FDA and has been authorized for detection and/or diagnosis of SARS-CoV-2 by FDA under an Emergency Use Authorization (EUA). This EUA will remain in effect (meaning this test can be used) for the duration of the COVID-19 declaration under Section 564(b)(1) of the Act, 21 U.S.C. section 360bbb-3(b)(1), unless the authorization is terminated or revoked.  Performed at [facility], [place] [address] [place], [place] [address]     Procedures and diagnostic studies:  DG Chest Port 1 View  Result Date: 01/13/2021 CLINICAL DATA:  Shortness of breath.  COVID positive. EXAM: PORTABLE CHEST 1 VIEW COMPARISON:  08/27/2020 and 04/18/2016. FINDINGS: Single view of the chest was obtained. There is vague opacity in the right upper chest just lateral to the anterior right first rib. This appears to be a chronic finding based on a chest radiograph from 2017. Heart size is within normal limits. No focal airspace disease or lung consolidation. Patient is slightly rotated on this examination. IMPRESSION: 1. No acute cardiopulmonary disease. Electronically Signed   By: Markus Daft M.D.   On: 01/13/2021 13:14    Medications:    ARIPiprazole  2 mg Oral Daily   vitamin C  500 mg Oral Daily   aspirin EC  81 mg Oral BID   dexamethasone  6 mg Oral Q24H   docusate sodium  100 mg Oral BID   enoxaparin (LOVENOX) injection  40 mg Subcutaneous Q24H   fludrocortisone  100 mcg Oral Daily   FLUoxetine  40 mg Oral Daily   [START ON 01/15/2021] furosemide  20 mg Oral QODAY    Ipratropium-Albuterol  1 puff Inhalation Q6H   loratadine  10 mg Oral Daily   memantine  10 mg Oral BID   pantoprazole  80 mg Oral Daily   pyridostigmine  60 mg Oral TID   zinc sulfate  220 mg Oral Daily   Continuous Infusions:  remdesivir 100 mg in NS 100 mL 100 mg (01/14/21 1035)     LOS: 1 day   Geradine Girt  [facility]   How to contact the [facility] Attending or Consulting provider [place] or covering provider during after hours [place], for  this patient?  Check the care team in [facility] and look for a) attending/consulting [facility] provider listed and b) the [facility] team listed Log into [email or web address] and use [place]'s universal password to access. If you do not have the password, please contact the hospital operator. Locate the [facility] provider you are looking for under [facility] and page to a number that you can be directly reached. If you still have difficulty reaching the provider, please page the [facility] (Director on Call) for the Hospitalists listed on amion for assistance.  01/14/2021, 1:30 PM

## 2021-01-15 ENCOUNTER — Inpatient Hospital Stay (HOSPITAL_COMMUNITY): Payer: Medicare Other

## 2021-01-15 DIAGNOSIS — G309 Alzheimer's disease, unspecified: Secondary | ICD-10-CM | POA: Diagnosis not present

## 2021-01-15 DIAGNOSIS — I951 Orthostatic hypotension: Secondary | ICD-10-CM | POA: Diagnosis not present

## 2021-01-15 DIAGNOSIS — F0280 Dementia in other diseases classified elsewhere without behavioral disturbance: Secondary | ICD-10-CM | POA: Diagnosis not present

## 2021-01-15 DIAGNOSIS — U071 COVID-19: Secondary | ICD-10-CM | POA: Diagnosis not present

## 2021-01-15 LAB — FERRITIN: Ferritin: 92 ng/mL (ref 11–307)

## 2021-01-15 LAB — CBC WITH DIFFERENTIAL/PLATELET
Abs Immature Granulocytes: 0.02 10*3/uL (ref 0.00–0.07)
Basophils Absolute: 0 10*3/uL (ref 0.0–0.1)
Basophils Relative: 0 %
Eosinophils Absolute: 0 10*3/uL (ref 0.0–0.5)
Eosinophils Relative: 0 %
HCT: 45.7 % (ref 36.0–46.0)
Hemoglobin: 14.5 g/dL (ref 12.0–15.0)
Immature Granulocytes: 1 %
Lymphocytes Relative: 24 %
Lymphs Abs: 0.7 10*3/uL (ref 0.7–4.0)
MCH: 28.6 pg (ref 26.0–34.0)
MCHC: 31.7 g/dL (ref 30.0–36.0)
MCV: 90.1 fL (ref 80.0–100.0)
Monocytes Absolute: 0.2 10*3/uL (ref 0.1–1.0)
Monocytes Relative: 8 %
Neutro Abs: 1.9 10*3/uL (ref 1.7–7.7)
Neutrophils Relative %: 67 %
Platelets: 141 10*3/uL — ABNORMAL LOW (ref 150–400)
RBC: 5.07 MIL/uL (ref 3.87–5.11)
RDW: 13 % (ref 11.5–15.5)
WBC: 2.8 10*3/uL — ABNORMAL LOW (ref 4.0–10.5)
nRBC: 0 % (ref 0.0–0.2)

## 2021-01-15 LAB — COMPREHENSIVE METABOLIC PANEL
ALT: 14 U/L (ref 0–44)
AST: 16 U/L (ref 15–41)
Albumin: 3.4 g/dL — ABNORMAL LOW (ref 3.5–5.0)
Alkaline Phosphatase: 56 U/L (ref 38–126)
Anion gap: 7 (ref 5–15)
BUN: 17 mg/dL (ref 8–23)
CO2: 31 mmol/L (ref 22–32)
Calcium: 9.1 mg/dL (ref 8.9–10.3)
Chloride: 102 mmol/L (ref 98–111)
Creatinine, Ser: 0.73 mg/dL (ref 0.44–1.00)
GFR, Estimated: >60 mL/min (ref >60)
Glucose, Bld: 152 mg/dL — ABNORMAL HIGH (ref 70–99)
Potassium: 4.2 mmol/L (ref 3.5–5.1)
Sodium: 140 mmol/L (ref 135–145)
Total Bilirubin: 0.7 mg/dL (ref 0.3–1.2)
Total Protein: 6.6 g/dL (ref 6.5–8.1)

## 2021-01-15 LAB — C-REACTIVE PROTEIN: CRP: 0.6 mg/dL (ref <1.0)

## 2021-01-15 LAB — D-DIMER, QUANTITATIVE: D-Dimer, Quant: 0.71 ug/mL-FEU — ABNORMAL HIGH (ref 0.00–0.50)

## 2021-01-15 NOTE — Evaluation (Addendum)
Clinical/Bedside Swallow Evaluation Patient Details  Name: Sabrina Page MRN: [id number] Date of Birth: 01/02/1934  Today's Date: 01/15/2021 Time: SLP Start Time (ACUTE ONLY): 1145 SLP Stop Time (ACUTE ONLY): 1237 SLP Time Calculation (min) (ACUTE ONLY): 52 min  Past Medical History:  Past Medical History:  Diagnosis Date   Allergy    rhinitis   Asthma    Chronic kidney disease    hx of cystitis    COPD (chronic obstructive pulmonary disease) (HCC)    Depression    Dizziness    vertigo    Gallstones    GERD (gastroesophageal reflux disease)    Hyperlipidemia    Hypertension    Obesity    Osteoarthritis    Osteoporosis    Peripheral vascular disease (HCC)    swelling    Pneumonia    Shortness of breath    with exertion    Shoulder injury    left   Sleep apnea    never diagnosed    Urinary incontinence    Past Surgical History:  Past Surgical History:  Procedure Laterality Date   CHOLECYSTECTOMY  09/14/2011   Procedure: LAPAROSCOPIC CHOLECYSTECTOMY;  Surgeon: Odis Hollingshead, MD;  Location: [facility];  Service: General;  Laterality: N/A;  attempted intraoperative cholangiogram    HIP ARTHROPLASTY Right 04/17/2016   Procedure: ARTHROPLASTY BIPOLAR HIP (HEMIARTHROPLASTY);  Surgeon: Paralee Cancel, MD;  Location: [facility];  Service: Orthopedics;  Laterality: Right;   L TKR     LIVER BIOPSY  09/14/2011   Procedure: LIVER BIOPSY;  Surgeon: Odis Hollingshead, MD;  Location: [facility];  Service: General;  Laterality: N/A;   OTHER SURGICAL HISTORY     left leg surgery    OTHER SURGICAL HISTORY     left breast surgery due to mastitis    right left knee arthroplast     HPI:  Per MD note,  "Sabrina Page is an 87 y.o. female with medical history significant of Alzheimer Dementia, OA, chronic low back pain, morbid obesity, adult failure to thrive, COPD/asthma, chronic diastolic congestive failure, essential hypertension, hyperlipidemia who has been bedbound for the last  1-2 months who presents to [facility] ED with fever, progressive shortness of breath, cough.  Positive contact with COVID infected individual (Husband).  " Swallow eval ordered per daughter request due to pt coughing with intake. CXR negative.   Daughter, [name], called and reports pt coughing with intake prior to admission - with both solid and liquids - especially via straw.  States this occurs intermittently, reports caregivers feed pt. She reports pt takes "teeny tiny" bites and eats slowly.  No pneumonias reported, per daughter pt has CHF.   Assessment / Plan / Recommendation Clinical Impression  Pt presents with suspected cognitive based dysphagia c/b clinical appearance of prolonged oral transiting with delayed swallow.  Pt with overt cough s/p swallow of 2nd cracker bolus ? due to infiltration of cracker into airway.  Otherwise not indications of aspiration with intake of tea via cup, chopped peaches, mashed potatoes, small bite of carrot nor 2 small bites of pot roast.  Did not test straw due to daughter's report of pt coughing with liquids via straw. Pt encouraged to self feed to control her rate.  She admits she is fed too quickly at home. Frequently states "I don't know" to SLP ?s.  Pt requires extended amount of time to eat - and throughout eval/tx session, she only consumed approx 10% of her lunch.  She subtley grimaces and  advises that the food does not taste good.  Offered Ensure - as liquid nutrition with be most effiicient for pt to consume.  She was wiling to try Strawberry Ensure -  requested NT take her one later when he sees her. Will follow up to assure po tolerance, determine indication for instrumental evaluation.  She will need full supervision for airway protection given her cognitive deficits. SLP Visit Diagnosis: Dysphagia, unspecified (R13.10)    Aspiration Risk  Mild aspiration risk;Risk for inadequate nutrition/hydration (with precautions)    Diet Recommendation Dysphagia 3 (Mech  soft);Thin liquid   Liquid Administration via: Cup;No straw Medication Administration: Whole meds with puree Supervision: Staff to assist with self feeding Compensations: Slow rate;Small sips/bites Postural Changes: Seated upright at 90 degrees;Remain upright for at least 30 minutes after po intake    Other  Recommendations Oral Care Recommendations: Oral care BID   Follow up Recommendations    TBD    Frequency and Duration min 1 x/week  1 week       Prognosis Prognosis for Safe Diet Advancement: Fair      Swallow Study   General Date of Onset: 01/15/21 HPI: Per MD note,  "Sabrina Page is an 87 y.o. female with medical history significant of Alzheimer Dementia, OA, chronic low back pain, morbid obesity, adult failure to thrive, COPD/asthma, chronic diastolic congestive failure, essential hypertension, hyperlipidemia who has been bedbound for the last 1-2 months who presents to [facility] ED with fever, progressive shortness of breath, cough.  Positive contact with COVID infected individual (Husband).  " Swallow eval ordered per daughter request due to pt coughing with intake. CXR negative. Type of Study: Bedside Swallow Evaluation Previous Swallow Assessment: none in EPIC Diet Prior to this Study: Regular Temperature Spikes Noted: No Respiratory Status: Room air History of Recent Intubation: No Behavior/Cognition: Alert Oral Cavity Assessment: Within Functional Limits Oral Care Completed by SLP: No Oral Cavity - Dentition: Dentures, top;Dentures, bottom Vision: Functional for self-feeding Self-Feeding Abilities: Needs assist Patient Positioning: Upright in bed Baseline Vocal Quality: Normal Volitional Cough: Strong Volitional Swallow: Able to elicit    Oral/Motor/Sensory Function Overall Oral Motor/Sensory Function: Within functional limits   Ice Chips Ice chips: Within functional limits   Thin Liquid Thin Liquid: Impaired Presentation: Self Fed;Cup Pharyngeal  Phase  Impairments: Suspected delayed Swallow    Nectar Thick Nectar Thick Liquid: Not tested   Honey Thick Honey Thick Liquid: Not tested   Puree Puree: Impaired Presentation: Self Fed;Spoon Pharyngeal Phase Impairments: Suspected delayed Swallow Other Comments: mashed potatoes, applesauce   Solid     Solid: Impaired Presentation: Self Fed Oral Phase Functional Implications: Prolonged oral transit Pharyngeal Phase Impairments: Cough - Delayed;Suspected delayed Swallow Other Comments: crackers, pot roast, peaches      Macario Golds 01/15/2021,12:56 PM   Kathleen Lime, MS [facility] SLP [place] Office [phone] Pager [phone]

## 2021-01-15 NOTE — Progress Notes (Signed)
Progress Note    Sabrina Page  [id number] DOB: 24-Nov-1933  DOA: 01/13/2021 PCP: Cassandria Anger, MD    Brief Narrative:     Medical records reviewed and are as summarized below:  Sabrina Page is an 87 y.o. female with medical history significant of Alzheimer Dementia, OA, chronic low back pain, morbid obesity, adult failure to thrive, COPD/asthma, chronic diastolic congestive failure, essential hypertension, hyperlipidemia who has been bedbound for the last 1-2 months who presents to [facility] H ED with fever, progressive shortness of breath, cough.  Positive contact with COVID infected individual (Husband).  ? CVA per family for 1 months with symptoms of leaning to the right  Assessment/Plan:   Active Problems:   Depression   Falls frequently   Orthostatic hypotension dysautonomic syndrome   Alzheimer disease ([place])   Pneumonia due to COVID-19 virus   Acute respiratory failure with hypoxia (HCC)   Acute hypoxic respiratory failure secondary to acute Covid-19 viral pneumonia during the ongoing Covid 19 Pandemic - POA --COVID test: 01/13/2021 --Remdesivir, plan 5-day course (Day #3/5) --Decadron 6 mg p.o. daily x10 days --wean to RA --Continue supportive care with Combivent MDI prn, vitamin C, zinc, Tylenol, antitussives (benzonatate/ Mucinex/Tussionex) --Follow CBC, CMP, D-dimer, ferritin, and CRP daily --Continue airborne/contact isolation precautions for 10 days from the day of diagnosis  Stroke like symptoms > 1 month -check head CT -SLP eval -PT/OT-- family wants to take home -needs hydraulic lift  COPD/asthma Patient has not oxygen dependent at baseline.  Not on any inhalers at baseline. --Continue supplemental oxygen, maintain SPO2 greater than 88%.   Chronic diastolic congestive failure, compensated TTE 01/19/2016 with LVEF 0000000, grade 2 diastolic dysfunction.  Not on beta-blocker, ACE/ARB due to orthostatic hypotension dysautonomic  syndrome history. --Furosemide - held for poor PO intake --Strict I's and O's and daily weights   Essential hypertension Patient on midodrine '5mg'$  p.o. twice daily.  BP on admission 171/68. --Hold midodrine for now --Continue aspirin 81 mg p.o. twice daily --Monitor BP closely   Alzheimer's dementia Follows with palliative care outpatient, last seen by NP on 12/25/2020. --Namenda 10 mg p.o. twice daily   Orthostatic hypotension dysautonomic syndrome Patient now apparently bedbound at baseline over the last 1-2 months. --Continue Florinef --Holding home midodrine due to elevated BP --PT eval   OA chronic low back pain --Acetaminophen and tramadol as needed   Anxiety/depression:  --Fluoxetine 40 mg p.o. daily --Abilify 2 mg daily   GERD:  -Continue PPI   Adult failure to thrive Patient is followed by [facility] palliative care outpatient.  She is apparently bedbound at baseline which is new over the last 1-2 months.   -Patient lives with husband with 24-7-hour caregivers present.     obesity Body mass index is 41.44 kg/m.   Family Communication/Anticipated D/C date and plan/Code Status   DVT prophylaxis: Lovenox ordered. Code Status: DNR Disposition Plan: Status is: Inpatient  Remains inpatient appropriate because:Inpatient level of care appropriate due to severity of illness  Dispo: The patient is from: Home              Anticipated d/c is to: Home              Patient currently is not medically stable to d/c.   Difficult to place patient No         Medical Consultants:   None.   Subjective:   Is wondering what is wrong with her  Objective:    Vitals:  01/14/21 1311 01/14/21 2125 01/15/21 0629 01/15/21 1137  BP: 128/71 130/60 (!) 171/86 (!) 156/73  Pulse: 61 (!) 56 (!) 52   Resp: '20 19 18   '$ Temp: 97.7 F (36.5 C) 98.4 F (36.9 C) 98 F (36.7 C)   TempSrc: Oral Oral Oral   SpO2: 94% 98% 100% 97%  Weight:      Height:         Intake/Output Summary (Last 24 hours) at 01/15/2021 1257 Last data filed at 01/14/2021 1900 Gross per 24 hour  Intake 46.24 ml  Output 401 ml  Net -354.76 ml   Filed Weights   01/13/21 1658  Weight: 109.5 kg    Exam:  General: Appearance:    Severely obese female in no acute distress     Lungs:     respirations unlabored  Heart:    Bradycardic.   MS:   All extremities are intact.    Neurologic:   Awake, flat affect       Data Reviewed:   I have personally reviewed following labs and imaging studies:  Labs: Labs show the following:   Basic Metabolic Panel: Recent Labs  Lab 01/13/21 1204 01/14/21 0415 01/15/21 0402  NA 134* 139 140  K 3.7 4.0 4.2  CL 96* 101 102  CO2 '28 28 31  '$ GLUCOSE 90 138* 152*  BUN '13 13 17  '$ CREATININE 0.87 0.75 0.73  CALCIUM 8.4* 8.9 9.1   GFR Estimated Creatinine Clearance: 62.2 mL/min (by C-G formula based on SCr of 0.73 mg/dL). Liver Function Tests: Recent Labs  Lab 01/13/21 1204 01/14/21 0415 01/15/21 0402  AST '19 18 16  '$ ALT '15 16 14  '$ ALKPHOS 59 64 56  BILITOT 0.7 0.6 0.7  PROT 6.2* 6.8 6.6  ALBUMIN 3.3* 3.4* 3.4*   No results for input(s): LIPASE, AMYLASE in the last 168 hours. No results for input(s): AMMONIA in the last 168 hours. Coagulation profile No results for input(s): INR, PROTIME in the last 168 hours.  CBC: Recent Labs  Lab 01/13/21 1204 01/14/21 0415 01/15/21 0402  WBC 3.8* 2.6* 2.8*  NEUTROABS 2.1 1.8 1.9  HGB 13.7 14.9 14.5  HCT 42.7 46.9* 45.7  MCV 90.9 91.4 90.1  PLT 122* 129* 141*   Cardiac Enzymes: No results for input(s): CKTOTAL, CKMB, CKMBINDEX, TROPONINI in the last 168 hours. BNP (last 3 results) No results for input(s): PROBNP in the last 8760 hours. CBG: No results for input(s): GLUCAP in the last 168 hours. D-Dimer: Recent Labs    01/14/21 0415 01/15/21 0402  DDIMER 1.10* 0.71*   Hgb A1c: Recent Labs    01/13/21 1658  HGBA1C 5.5   Lipid Profile: No results for  input(s): CHOL, HDL, LDLCALC, TRIG, CHOLHDL, LDLDIRECT in the last 72 hours. Thyroid function studies: No results for input(s): TSH, T4TOTAL, T3FREE, THYROIDAB in the last 72 hours.  Invalid input(s): FREET3 Anemia work up: Recent Labs    01/14/21 0415 01/15/21 0402  FERRITIN 88 92   Sepsis Labs: Recent Labs  Lab 01/13/21 1204 01/14/21 0415 01/15/21 0402  WBC 3.8* 2.6* 2.8*    Microbiology Recent Results (from the past 240 hour(s))  Resp Panel by RT-PCR (Flu A&B, Covid) Nasopharyngeal Swab     Status: Abnormal   Collection Time: 01/13/21 11:49 AM   Specimen: Nasopharyngeal Swab; Nasopharyngeal(NP) swabs in vial transport medium  Result Value Ref Range Status   SARS Coronavirus 2 by RT PCR POSITIVE (A) NEGATIVE Final    Comment: RESULT CALLED TO,  READ BACK BY AND VERIFIED WITH: JACOB,D. RN '@1404'$  ON 08.23.2022 BY COHEN,K (NOTE) SARS-CoV-2 target nucleic acids are DETECTED.  The SARS-CoV-2 RNA is generally detectable in upper respiratory specimens during the acute phase of infection. Positive results are indicative of the presence of the identified virus, but do not rule out bacterial infection or co-infection with other pathogens not detected by the test. Clinical correlation with patient history and other diagnostic information is necessary to determine patient infection status. The expected result is Negative.  Fact Sheet for Patients: EntrepreneurPulse.com.au  Fact Sheet for Healthcare Providers: IncredibleEmployment.be  This test is not yet approved or cleared by the [place] FDA and  has been authorized for detection and/or diagnosis of SARS-CoV-2 by FDA under an Emergency Use Authorization (EUA).  This EUA will remain in effect (meaning this te st can be used) for the duration of  the COVID-19 declaration under Section 564(b)(1) of the Act, 21 U.S.C. section 360bbb-3(b)(1), unless the authorization is terminated or  revoked sooner.     Influenza A by PCR NEGATIVE NEGATIVE Final   Influenza B by PCR NEGATIVE NEGATIVE Final    Comment: (NOTE) The Xpert Xpress SARS-CoV-2/FLU/RSV plus assay is intended as an aid in the diagnosis of influenza from Nasopharyngeal swab specimens and should not be used as a sole basis for treatment. Nasal washings and aspirates are unacceptable for Xpert Xpress SARS-CoV-2/FLU/RSV testing.  Fact Sheet for Patients: EntrepreneurPulse.com.au  Fact Sheet for Healthcare Providers: IncredibleEmployment.be  This test is not yet approved or cleared by the [place] FDA and has been authorized for detection and/or diagnosis of SARS-CoV-2 by FDA under an Emergency Use Authorization (EUA). This EUA will remain in effect (meaning this test can be used) for the duration of the COVID-19 declaration under Section 564(b)(1) of the Act, 21 U.S.C. section 360bbb-3(b)(1), unless the authorization is terminated or revoked.  Performed at [facility], [place] [address] [place], [place] [address]     Procedures and diagnostic studies:  No results found.  Medications:    ARIPiprazole  2 mg Oral Daily   vitamin C  500 mg Oral Daily   aspirin EC  81 mg Oral BID   dexamethasone  6 mg Oral Q24H   docusate sodium  100 mg Oral BID   enoxaparin (LOVENOX) injection  40 mg Subcutaneous Q24H   fludrocortisone  100 mcg Oral Daily   FLUoxetine  40 mg Oral Daily   Ipratropium-Albuterol  1 puff Inhalation Q6H   loratadine  10 mg Oral Daily   memantine  10 mg Oral BID   pantoprazole  80 mg Oral Daily   pyridostigmine  60 mg Oral TID   zinc sulfate  220 mg Oral Daily   Continuous Infusions:  remdesivir 100 mg in NS 100 mL Stopped (01/14/21 1050)     LOS: 2 days   Geradine Girt  [facility]   How to contact the [facility] Attending or Consulting provider [place] or covering provider during after hours [place], for this  patient?  Check the care team in [facility] and look for a) attending/consulting TRH provider listed and b) the [facility] team listed Log into [email or web address] and use [place]'s universal password to access. If you do not have the password, please contact the hospital operator. Locate the [facility] provider you are looking for under [facility] and page to a number that you can be directly reached. If you still have difficulty reaching the provider, please page  the [facility] (Director on Call) for the Hospitalists listed on amion for assistance.  01/15/2021, 12:57 PM

## 2021-01-15 NOTE — Evaluation (Signed)
Physical Therapy Evaluation Patient Details Name: Sabrina Page MRN: [id number] DOB: [birth year] Today's Date: 01/15/2021   History of Present Illness  87 y.o. female with medical history significant of Alzheimer Dementia, OA, chronic low back pain, morbid obesity, adult failure to thrive, COPD/asthma, chronic diastolic congestive failure, essential hypertension, hyperlipidemia who has been bedbound for the last 1-2 months who presents to [facility] H ED with fever, progressive shortness of breath, cough.  Positive contact with COVID infected individual (Husband).  Clinical Impression  Pt admitted with above diagnosis. +2 max assist for supine to sit, +2 to roll. Pt leans R in sitting requiring min to mod assist for static balance. +2 mod assist sit to stand with Stedy lift equipment. Pt stood ~30 seconds, then needed to sit on Stedy seat 2* fatigue and L knee pain. Pt sat EOB and in stedy for ~12 minutes. SaO2 97% on room air. Pt with flat affect. Daughter hopes to have pt DC home with 24* private care giver. They will need a hydraulic hoyer and PTAR transport home. Daughter is concerned about potential CVA due to R lean, MD notified. Pt follows commands inconsistently via interpreter and was transferring with lift equipment at baseline at home.   Pt currently with functional limitations due to the deficits listed below (see PT Problem List). Pt will benefit from skilled PT to increase their independence and safety with mobility to allow discharge to the venue listed below.       Follow Up Recommendations Supervision/Assistance - 24 hour;Supervision for mobility/OOB;SNF (HHPT if family can provide needed level of assistance.)    Equipment Recommendations  Other (comment) (If DC home, hydrolic hoyer recommended, and PTAR transport home.)    Recommendations for Other Services       Precautions / Restrictions Precautions Precautions: Fall Restrictions Weight Bearing Restrictions: No       Mobility  Bed Mobility Overal bed mobility: Needs Assistance Bed Mobility: Rolling;Supine to Sit Rolling: Total assist;+2 for physical assistance   Supine to sit: +2 for physical assistance;Max assist     General bed mobility comments: assist to roll for pericare, assist to raise trunk, advance BLEs  and pivot hips to edge of bed    Transfers Overall transfer level: Needs assistance   Transfers: Sit to/from Stand Sit to Stand: From elevated surface;Mod assist;+2 physical assistance         General transfer comment: +2 mod A to power up, pt stood for ~30 sec then stated she was weak and needed to sit on Stedy flaps. She became tremulous, c/o L knee pain and stated she was dizzy, so assisted back to supine. Vitals signs stable. SaO2 97% on room air. Pt soiled with BM. +2 total to roll for clean up.  Ambulation/Gait             General Gait Details: non ambulatory at baseline  Stairs            Wheelchair Mobility    Modified Rankin (Stroke Patients Only)       Balance Overall balance assessment: Needs assistance Sitting-balance support: Bilateral upper extremity supported;Feet supported Sitting balance-Leahy Scale: Poor Sitting balance - Comments: leans R, min to mod A for static balance Postural control: Right lateral lean                                   Pertinent Vitals/Pain Pain Assessment: Faces Faces Pain Scale: Hurts even more  Pain Location: L knee in standing Pain Descriptors / Indicators: Grimacing Pain Intervention(s): Limited activity within patient's tolerance;Monitored during session;Repositioned    Home Living Family/patient expects to be discharged to:: Private residence Living Arrangements: Spouse/significant other Available Help at Discharge: Available 24 hours/day;Personal care attendant   Home Access: Level entry     Home Layout: One level Home Equipment: Walker - 4 wheels;Wheelchair - manual;Shower seat -  built in;Bedside commode Additional Comments: info obtained by phone from daughter [name] (who is symptomatic of covid)    Prior Function Level of Independence: Needs assistance   Gait / Transfers Assistance Needed: transfers with assist using stedy  ADL's / Homemaking Assistance Needed: 24 hour caregiver assists        Hand Dominance        Extremity/Trunk Assessment   Upper Extremity Assessment Upper Extremity Assessment: Defer to OT evaluation    Lower Extremity Assessment Lower Extremity Assessment: Generalized weakness;Difficult to assess due to impaired cognition    Cervical / Trunk Assessment Cervical / Trunk Assessment: Normal  Communication   Communication: Interpreter utilized;Prefers language other than English (russian)  Cognition Arousal/Alertness: Awake/alert Behavior During Therapy: Flat affect Overall Cognitive Status: No family/caregiver present to determine baseline cognitive functioning                                 General Comments: sometimes didn't respond to questions via interpreter, noted h/o dementia, was able to state her birthdate and follow simple commands but had trouble with multistep commands      General Comments      Exercises General Exercises - Lower Extremity Ankle Circles/Pumps: AROM;Both;15 reps Quad Sets:  (attempted, pt unable to understand technique)   Assessment/Plan    PT Assessment Patient needs continued PT services  PT Problem List Decreased strength;Decreased mobility;Decreased activity tolerance;Pain;Decreased cognition       PT Treatment Interventions DME instruction;Therapeutic activities;Therapeutic exercise;Functional mobility training;Balance training;Patient/family education    PT Goals (Current goals can be found in the Care Plan section)  Acute Rehab PT Goals Patient Stated Goal: to be able to transfer to a chair PT Goal Formulation: With family Time For Goal Achievement:  01/29/21 Potential to Achieve Goals: Fair    Frequency Min 3X/week   Barriers to discharge        Co-evaluation               AM-PAC PT "6 Clicks" Mobility  Outcome Measure Help needed turning from your back to your side while in a flat bed without using bedrails?: Total Help needed moving from lying on your back to sitting on the side of a flat bed without using bedrails?: Total Help needed moving to and from a bed to a chair (including a wheelchair)?: Total Help needed standing up from a chair using your arms (e.g., wheelchair or bedside chair)?: Total Help needed to walk in hospital room?: Total Help needed climbing 3-5 steps with a railing? : Total 6 Click Score: 6    End of Session Equipment Utilized During Treatment: Gait belt Activity Tolerance: Patient limited by fatigue Patient left: in bed;with call bell/phone within reach;with bed alarm set Nurse Communication: Mobility status;Need for lift equipment PT Visit Diagnosis: Muscle weakness (generalized) (M62.81);Other abnormalities of gait and mobility (R26.89);Unsteadiness on feet (R26.81)    Time: [id number] PT Time Calculation (min) (ACUTE ONLY): 42 min   Charges:   PT Evaluation $PT Eval High Complexity: 1 High  PT Treatments $Therapeutic Activity: 23-37 mins       Blondell Reveal Kistler PT 01/15/2021  Acute Rehabilitation Services Pager [phone] Office [phone]

## 2021-01-15 NOTE — TOC Progression Note (Signed)
Transition of Care [facility]) - Progression Note    Patient Details  Name: Sabrina Page MRN: [id number] Date of Birth: 02/28/1934  Transition of Care [facility]) CM/SW Contact  Purcell Mouton, RN Phone Number: 01/15/2021, 2:22 PM  Clinical Narrative:     Pt from home with spouse and adult daughter. Daughter Sabrina Page a PT, will care for pt at home. Sabrina Page asked for a Mudlogger.  Expected Discharge Plan: Home/Self Care Barriers to Discharge: No Barriers Identified  Expected Discharge Plan and Services Expected Discharge Plan: Home/Self Care       Living arrangements for the past 2 months: Single Family Home                                       Social Determinants of Health (SDOH) Interventions    Readmission Risk Interventions No flowsheet data found.

## 2021-01-16 DIAGNOSIS — J9601 Acute respiratory failure with hypoxia: Secondary | ICD-10-CM | POA: Diagnosis not present

## 2021-01-16 DIAGNOSIS — I951 Orthostatic hypotension: Secondary | ICD-10-CM | POA: Diagnosis not present

## 2021-01-16 DIAGNOSIS — U071 COVID-19: Secondary | ICD-10-CM | POA: Diagnosis not present

## 2021-01-16 DIAGNOSIS — G309 Alzheimer's disease, unspecified: Secondary | ICD-10-CM | POA: Diagnosis not present

## 2021-01-16 LAB — CBC WITH DIFFERENTIAL/PLATELET
Abs Immature Granulocytes: 0.04 10*3/uL (ref 0.00–0.07)
Basophils Absolute: 0 10*3/uL (ref 0.0–0.1)
Basophils Relative: 0 %
Eosinophils Absolute: 0 10*3/uL (ref 0.0–0.5)
Eosinophils Relative: 0 %
HCT: 46.4 % — ABNORMAL HIGH (ref 36.0–46.0)
Hemoglobin: 14.7 g/dL (ref 12.0–15.0)
Immature Granulocytes: 1 %
Lymphocytes Relative: 21 %
Lymphs Abs: 0.7 10*3/uL (ref 0.7–4.0)
MCH: 29 pg (ref 26.0–34.0)
MCHC: 31.7 g/dL (ref 30.0–36.0)
MCV: 91.5 fL (ref 80.0–100.0)
Monocytes Absolute: 0.3 10*3/uL (ref 0.1–1.0)
Monocytes Relative: 7 %
Neutro Abs: 2.5 10*3/uL (ref 1.7–7.7)
Neutrophils Relative %: 71 %
Platelets: 135 10*3/uL — ABNORMAL LOW (ref 150–400)
RBC: 5.07 MIL/uL (ref 3.87–5.11)
RDW: 12.9 % (ref 11.5–15.5)
WBC: 3.5 10*3/uL — ABNORMAL LOW (ref 4.0–10.5)
nRBC: 0 % (ref 0.0–0.2)

## 2021-01-16 LAB — COMPREHENSIVE METABOLIC PANEL
ALT: 14 U/L (ref 0–44)
AST: 16 U/L (ref 15–41)
Albumin: 3.2 g/dL — ABNORMAL LOW (ref 3.5–5.0)
Alkaline Phosphatase: 52 U/L (ref 38–126)
Anion gap: 11 (ref 5–15)
BUN: 18 mg/dL (ref 8–23)
CO2: 27 mmol/L (ref 22–32)
Calcium: 8.7 mg/dL — ABNORMAL LOW (ref 8.9–10.3)
Chloride: 98 mmol/L (ref 98–111)
Creatinine, Ser: 0.74 mg/dL (ref 0.44–1.00)
GFR, Estimated: >60 mL/min (ref >60)
Glucose, Bld: 134 mg/dL — ABNORMAL HIGH (ref 70–99)
Potassium: 4.3 mmol/L (ref 3.5–5.1)
Sodium: 136 mmol/L (ref 135–145)
Total Bilirubin: 0.7 mg/dL (ref 0.3–1.2)
Total Protein: 6.2 g/dL — ABNORMAL LOW (ref 6.5–8.1)

## 2021-01-16 LAB — D-DIMER, QUANTITATIVE: D-Dimer, Quant: 0.56 ug/mL-FEU — ABNORMAL HIGH (ref 0.00–0.50)

## 2021-01-16 LAB — FERRITIN: Ferritin: 91 ng/mL (ref 11–307)

## 2021-01-16 LAB — C-REACTIVE PROTEIN: CRP: 0.5 mg/dL (ref <1.0)

## 2021-01-16 MED ORDER — MIDODRINE HCL 5 MG PO TABS
5.0000 mg | ORAL_TABLET | Freq: Two times a day (BID) | ORAL | Status: DC
  Administered 2021-01-16: 5 mg via ORAL
  Filled 2021-01-16: qty 1

## 2021-01-16 MED ORDER — MIDODRINE HCL 5 MG PO TABS: 5.0000 mg | ORAL_TABLET | Freq: Two times a day (BID) | ORAL | Status: DC

## 2021-01-16 NOTE — Progress Notes (Signed)
  Speech Language Pathology Treatment: Dysphagia  Patient Details Name: Sabrina Page MRN: [id number] DOB: July 07, 1933 Today's Date: 01/16/2021 Time: [id number] SLP Time Calculation (min) (ACUTE ONLY): 32 min  Assessment / Plan / Recommendation Clinical Impression  Pt encountered sitting upright in bed, feeding herself dinner.  SLP faciliated session by helping further with set up and with functional meal observation. Despite advising not to use straws, pt with several straws in drinks in her room.  Observed pt consuming baked potatoes, meat loaf, pears and warm tea via cup and straw.     Overt cough noted x1 when drinking tea via straw - but no indication of aspiration with all other boluses - approximately 15 total. Intake was limited as pt reported she was not hungry.   Removed all of pt's straws and posted sign on the door (two signs in room) to indicate pt not to have straws.   Suspect if pt is having aspiration PTA, it is due to decreased oral control/coordination.    Strict self feeding advised to allow pt to control her rate and maximize her airway protection.  Do not recommend MBS as pharyngeal swallow appears intact clinically.  SLP will phone pt's daughter with recommendations and will sign off at this time.   Thanks for allow me to help care for his most pleasant pt.    Left pt in room with [place] music on the computer.    HPI HPI: Per MD note,  "Sabrina Page is an 87 y.o. female with medical history significant of Alzheimer Dementia, OA, chronic low back pain, morbid obesity, adult failure to thrive, COPD/asthma, chronic diastolic congestive failure, essential hypertension, hyperlipidemia who has been bedbound for the last 1-2 months who presents to [facility] H ED with fever, progressive shortness of breath, cough.  Positive contact with COVID infected individual (Husband).  " Swallow eval ordered per daughter request due to pt coughing with intake. CXR negative.  Pt's CT  brain was negative for acute change. Follow up to address dysphagia goals.      SLP Plan   DC SLP       Recommendations  Diet recommendations: Dysphagia 3 (mechanical soft);Thin liquid Liquids provided via: Cup;No straw Medication Administration: Whole meds with puree Supervision: Patient able to self feed Compensations: Slow rate;Small sips/bites                        GO              Kathleen Lime, MS [facility] SLP Acute Rehab Services Office [phone] Pager [phone]   Macario Golds 01/16/2021, 7:53 PM

## 2021-01-16 NOTE — Care Management Important Message (Signed)
Medicare IM printed for [employer] to give to the patient. 

## 2021-01-16 NOTE — Progress Notes (Signed)
Progress Note    Sabrina Page  [name] DOB: 05/24/1934  DOA: 01/13/2021 PCP: Cassandria Anger, MD    Brief Narrative:     Medical records reviewed and are as summarized below:  Sabrina Page is an 87 y.o. female with medical history significant of Alzheimer Dementia, OA, chronic low back pain, morbid obesity, adult failure to thrive, COPD/asthma, chronic diastolic congestive failure, essential hypertension, hyperlipidemia who has been bedbound for the last 1-2 months who presents to [facility] ED with fever, progressive shortness of breath, cough.  Positive contact with COVID infected individual (Husband).     Assessment/Plan:   Active Problems:   Depression   Falls frequently   Orthostatic hypotension dysautonomic syndrome   Alzheimer disease ([place])   Pneumonia due to COVID-19 virus   Acute respiratory failure with hypoxia (HCC)   Acute hypoxic respiratory failure secondary to acute Covid-19 viral pneumonia during the ongoing Covid 19 Pandemic - POA --COVID test: 01/13/2021 --Remdesivir- refused by family --Decadron 6 mg p.o. daily x10 days per protocol --wean to RA --Continue supportive care with Combivent MDI prn, vitamin C, zinc, Tylenol, antitussives (benzonatate/ Mucinex/Tussionex) --Continue airborne/contact isolation precautions for 10 days from the day of diagnosis  Stroke like symptoms > 1 month -head CT negative for stroke but has atrophy -SLP eval- DYS diet -PT/OT-- family wants to take home -hydraulic lift- delivered to home  COPD/asthma Patient has not oxygen dependent at baseline.  Not on any inhalers at baseline. --wean to room air   Chronic diastolic congestive failure, compensated TTE 01/19/2016 with LVEF 0000000, grade 2 diastolic dysfunction.  Not on beta-blocker, ACE/ARB due to orthostatic hypotension dysautonomic syndrome history. --Furosemide - held for poor PO intake --Strict I's and O's and daily weights   Essential  hypertension Patient on midodrine '5mg'$  p.o. twice daily.  BP on admission 171/68. --Hold midodrine for now --Continue aspirin 81 mg p.o. twice daily --Monitor BP closely   Alzheimer's dementia Follows with palliative care outpatient, last seen by NP on 12/25/2020. --Namenda 10 mg p.o. twice daily   Orthostatic hypotension dysautonomic syndrome Patient now apparently bedbound at baseline over the last 1-2 months. --Continue Florinef --PT eval   OA chronic low back pain --Acetaminophen and tramadol as needed   Anxiety/depression:  --Fluoxetine 40 mg p.o. daily --Abilify 2 mg daily   GERD:  -Continue PPI   Adult failure to thrive Patient is followed by [facility] palliative care outpatient.  She is apparently bedbound at baseline which is new over the last 1-2 months.   -Patient lives with husband with 24-7-hour caregivers present.     obesity Body mass index is 40.23 kg/m.   Family Communication/Anticipated D/C date and plan/Code Status   DVT prophylaxis: Lovenox ordered. Code Status: DNR Disposition Plan: Status is: Inpatient Called daughter: she also has COVID   Remains inpatient appropriate because:Inpatient level of care appropriate due to severity of illness  Dispo: The patient is from: Home              Anticipated d/c is to: Home              Patient currently is not medically stable to d/c.   Difficult to place patient No         Medical Consultants:   None.   Subjective:   Flushed and feeling warm this AM- room temp on 75  Objective:    Vitals:   01/15/21 2127 01/16/21 0500 01/16/21 0528 01/16/21 1302  BP: (!) 143/64  Marland Kitchen)  164/76 137/62  Pulse: (!) 56  81 63  Resp: '20  18 16  '$ Temp: 98.2 F (36.8 C)  98 F (36.7 C) 98.7 F (37.1 C)  TempSrc: Oral  Oral Oral  SpO2: 93%  95% 93%  Weight:  106.3 kg    Height:        Intake/Output Summary (Last 24 hours) at 01/16/2021 1521 Last data filed at 01/16/2021 1310 Gross per 24 hour  Intake  120 ml  Output 650 ml  Net -530 ml   Filed Weights   01/13/21 1658 01/16/21 0500  Weight: 109.5 kg 106.3 kg    Exam:  General: Appearance:    Severely obese female who appears flushed     Lungs:     respirations unlabored  Heart:    Normal heart rate.    MS:   All extremities are intact.    Neurologic:   Awake, alert, more interactive       Data Reviewed:   I have personally reviewed following labs and imaging studies:  Labs: Labs show the following:   Basic Metabolic Panel: Recent Labs  Lab 01/13/21 1204 01/14/21 0415 01/15/21 0402 01/16/21 0418  NA 134* 139 140 136  K 3.7 4.0 4.2 4.3  CL 96* 101 102 98  CO2 '28 28 31 27  '$ GLUCOSE 90 138* 152* 134*  BUN '13 13 17 18  '$ CREATININE 0.87 0.75 0.73 0.74  CALCIUM 8.4* 8.9 9.1 8.7*   GFR Estimated Creatinine Clearance: 61.1 mL/min (by C-G formula based on SCr of 0.74 mg/dL). Liver Function Tests: Recent Labs  Lab 01/13/21 1204 01/14/21 0415 01/15/21 0402 01/16/21 0418  AST '19 18 16 16  '$ ALT '15 16 14 14  '$ ALKPHOS 59 64 56 52  BILITOT 0.7 0.6 0.7 0.7  PROT 6.2* 6.8 6.6 6.2*  ALBUMIN 3.3* 3.4* 3.4* 3.2*   No results for input(s): LIPASE, AMYLASE in the last 168 hours. No results for input(s): AMMONIA in the last 168 hours. Coagulation profile No results for input(s): INR, PROTIME in the last 168 hours.  CBC: Recent Labs  Lab 01/13/21 1204 01/14/21 0415 01/15/21 0402 01/16/21 0418  WBC 3.8* 2.6* 2.8* 3.5*  NEUTROABS 2.1 1.8 1.9 2.5  HGB 13.7 14.9 14.5 14.7  HCT 42.7 46.9* 45.7 46.4*  MCV 90.9 91.4 90.1 91.5  PLT 122* 129* 141* 135*   Cardiac Enzymes: No results for input(s): CKTOTAL, CKMB, CKMBINDEX, TROPONINI in the last 168 hours. BNP (last 3 results) No results for input(s): PROBNP in the last 8760 hours. CBG: No results for input(s): GLUCAP in the last 168 hours. D-Dimer: Recent Labs    01/15/21 0402 01/16/21 0418  DDIMER 0.71* 0.56*   Hgb A1c: Recent Labs    01/13/21 1658  HGBA1C  5.5   Lipid Profile: No results for input(s): CHOL, HDL, LDLCALC, TRIG, CHOLHDL, LDLDIRECT in the last 72 hours. Thyroid function studies: No results for input(s): TSH, T4TOTAL, T3FREE, THYROIDAB in the last 72 hours.  Invalid input(s): FREET3 Anemia work up: Recent Labs    01/15/21 0402 01/16/21 0418  FERRITIN 92 91   Sepsis Labs: Recent Labs  Lab 01/13/21 1204 01/14/21 0415 01/15/21 0402 01/16/21 0418  WBC 3.8* 2.6* 2.8* 3.5*    Microbiology Recent Results (from the past 240 hour(s))  Resp Panel by RT-PCR (Flu A&B, Covid) Nasopharyngeal Swab     Status: Abnormal   Collection Time: 01/13/21 11:49 AM   Specimen: Nasopharyngeal Swab; Nasopharyngeal(NP) swabs in vial transport medium  Result Value  Ref Range Status   SARS Coronavirus 2 by RT PCR POSITIVE (A) NEGATIVE Final    Comment: RESULT CALLED TO, READ BACK BY AND VERIFIED WITH: JACOB,D. RN '@1404'$  ON 08.23.2022 BY COHEN,K (NOTE) SARS-CoV-2 target nucleic acids are DETECTED.  The SARS-CoV-2 RNA is generally detectable in upper respiratory specimens during the acute phase of infection. Positive results are indicative of the presence of the identified virus, but do not rule out bacterial infection or co-infection with other pathogens not detected by the test. Clinical correlation with patient history and other diagnostic information is necessary to determine patient infection status. The expected result is Negative.  Fact Sheet for Patients: EntrepreneurPulse.com.au  Fact Sheet for Healthcare Providers: IncredibleEmployment.be  This test is not yet approved or cleared by the [place] FDA and  has been authorized for detection and/or diagnosis of SARS-CoV-2 by FDA under an Emergency Use Authorization (EUA).  This EUA will remain in effect (meaning this te st can be used) for the duration of  the COVID-19 declaration under Section 564(b)(1) of the Act, 21 U.S.C. section  360bbb-3(b)(1), unless the authorization is terminated or revoked sooner.     Influenza A by PCR NEGATIVE NEGATIVE Final   Influenza B by PCR NEGATIVE NEGATIVE Final    Comment: (NOTE) The Xpert Xpress SARS-CoV-2/FLU/RSV plus assay is intended as an aid in the diagnosis of influenza from Nasopharyngeal swab specimens and should not be used as a sole basis for treatment. Nasal washings and aspirates are unacceptable for Xpert Xpress SARS-CoV-2/FLU/RSV testing.  Fact Sheet for Patients: EntrepreneurPulse.com.au  Fact Sheet for Healthcare Providers: IncredibleEmployment.be  This test is not yet approved or cleared by the [place] FDA and has been authorized for detection and/or diagnosis of SARS-CoV-2 by FDA under an Emergency Use Authorization (EUA). This EUA will remain in effect (meaning this test can be used) for the duration of the COVID-19 declaration under Section 564(b)(1) of the Act, 21 U.S.C. section 360bbb-3(b)(1), unless the authorization is terminated or revoked.  Performed at [facility], [place] [address], [place], [place] [address]     Procedures and diagnostic studies:  CT HEAD WO CONTRAST (5MM)  Result Date: 01/15/2021 CLINICAL DATA:  Stroke, follow-up. Failure to thrive. Weakness. Dementia. EXAM: CT HEAD WITHOUT CONTRAST TECHNIQUE: Contiguous axial images were obtained from the base of the skull through the vertex without intravenous contrast. COMPARISON:  Head CT 04/28/2020 FINDINGS: Brain: Generalized brain atrophy. Chronic small-vessel ischemic changes of the hemispheric white matter. Chronic ventriculomegaly consistent with central atrophy. No sign of acute infarction, mass lesion, hemorrhage, obstructive hydrocephalus or extra-axial collection. Normal pressure hydrocephalus can not be absolutely excluded on the basis of the imaging, but is not favored. Vascular: There is atherosclerotic calcification of  the major vessels at the base of the brain. Skull: Negative.  Incidental hyperostosis interna. Sinuses/Orbits: Inflammatory changes of the left posterior ethmoid and sphenoid sinus which could relate to headache. This is similar to the study of last December. Other: None IMPRESSION: Generalized atrophy and chronic small-vessel ischemic change of the white matter. Chronic ventriculomegaly, essentially stable since December of last year. This is felt to be due to central atrophy. Normal pressure hydrocephalus is not favored but not absolutely excluded. Chronic opacification of the left posterior ethmoid region and left division of the sphenoid sinus. Electronically Signed   By: Nelson Chimes M.D.   On: 01/15/2021 14:24    Medications:    ARIPiprazole  2 mg Oral Daily   vitamin C  500 mg Oral Daily   aspirin EC  81 mg Oral BID   dexamethasone  6 mg Oral Q24H   docusate sodium  100 mg Oral BID   enoxaparin (LOVENOX) injection  40 mg Subcutaneous Q24H   fludrocortisone  100 mcg Oral Daily   FLUoxetine  40 mg Oral Daily   Ipratropium-Albuterol  1 puff Inhalation Q6H   loratadine  10 mg Oral Daily   memantine  10 mg Oral BID   pantoprazole  80 mg Oral Daily   pyridostigmine  60 mg Oral TID   zinc sulfate  220 mg Oral Daily   Continuous Infusions:     LOS: 3 days   Geradine Girt  [facility]   How to contact the [facility] Attending or Consulting provider [place] or covering provider during after hours [place], for this patient?  Check the care team in [facility] and look for a) attending/consulting [facility] provider listed and b) the [facility] team listed Log into [email or web address] and use [place]'s universal password to access. If you do not have the password, please contact the hospital operator. Locate the [facility] provider you are looking for under [facility] and page to a number that you can be directly reached. If you still have difficulty reaching the provider, please page the [facility] (Director on Call)  for the Hospitalists listed on amion for assistance.  01/16/2021, 3:21 PM

## 2021-01-17 ENCOUNTER — Inpatient Hospital Stay (HOSPITAL_COMMUNITY): Payer: Medicare Other

## 2021-01-17 DIAGNOSIS — I951 Orthostatic hypotension: Secondary | ICD-10-CM | POA: Diagnosis not present

## 2021-01-17 DIAGNOSIS — G309 Alzheimer's disease, unspecified: Secondary | ICD-10-CM | POA: Diagnosis not present

## 2021-01-17 DIAGNOSIS — U071 COVID-19: Secondary | ICD-10-CM | POA: Diagnosis not present

## 2021-01-17 DIAGNOSIS — J9601 Acute respiratory failure with hypoxia: Secondary | ICD-10-CM | POA: Diagnosis not present

## 2021-01-17 LAB — CBC WITH DIFFERENTIAL/PLATELET
Abs Immature Granulocytes: 0.03 10*3/uL (ref 0.00–0.07)
Basophils Absolute: 0 10*3/uL (ref 0.0–0.1)
Basophils Relative: 0 %
Eosinophils Absolute: 0 10*3/uL (ref 0.0–0.5)
Eosinophils Relative: 0 %
HCT: 46.7 % — ABNORMAL HIGH (ref 36.0–46.0)
Hemoglobin: 15.2 g/dL — ABNORMAL HIGH (ref 12.0–15.0)
Immature Granulocytes: 1 %
Lymphocytes Relative: 30 %
Lymphs Abs: 1.3 10*3/uL (ref 0.7–4.0)
MCH: 28.8 pg (ref 26.0–34.0)
MCHC: 32.5 g/dL (ref 30.0–36.0)
MCV: 88.6 fL (ref 80.0–100.0)
Monocytes Absolute: 0.5 10*3/uL (ref 0.1–1.0)
Monocytes Relative: 11 %
Neutro Abs: 2.6 10*3/uL (ref 1.7–7.7)
Neutrophils Relative %: 58 %
Platelets: 168 10*3/uL (ref 150–400)
RBC: 5.27 MIL/uL — ABNORMAL HIGH (ref 3.87–5.11)
RDW: 12.9 % (ref 11.5–15.5)
WBC: 4.5 10*3/uL (ref 4.0–10.5)
nRBC: 0 % (ref 0.0–0.2)

## 2021-01-17 LAB — COMPREHENSIVE METABOLIC PANEL
ALT: 16 U/L (ref 0–44)
AST: 16 U/L (ref 15–41)
Albumin: 3.6 g/dL (ref 3.5–5.0)
Alkaline Phosphatase: 56 U/L (ref 38–126)
Anion gap: 11 (ref 5–15)
BUN: 17 mg/dL (ref 8–23)
CO2: 29 mmol/L (ref 22–32)
Calcium: 9.2 mg/dL (ref 8.9–10.3)
Chloride: 99 mmol/L (ref 98–111)
Creatinine, Ser: 0.85 mg/dL (ref 0.44–1.00)
GFR, Estimated: >60 mL/min (ref >60)
Glucose, Bld: 128 mg/dL — ABNORMAL HIGH (ref 70–99)
Potassium: 4.5 mmol/L (ref 3.5–5.1)
Sodium: 139 mmol/L (ref 135–145)
Total Bilirubin: 1 mg/dL (ref 0.3–1.2)
Total Protein: 6.9 g/dL (ref 6.5–8.1)

## 2021-01-17 LAB — GLUCOSE, CAPILLARY: Glucose-Capillary: 127 mg/dL — ABNORMAL HIGH (ref 70–99)

## 2021-01-17 LAB — C-REACTIVE PROTEIN: CRP: 0.5 mg/dL (ref <1.0)

## 2021-01-17 LAB — FERRITIN: Ferritin: 99 ng/mL (ref 11–307)

## 2021-01-17 LAB — D-DIMER, QUANTITATIVE: D-Dimer, Quant: 0.43 ug/mL-FEU (ref 0.00–0.50)

## 2021-01-17 MED ORDER — DEXAMETHASONE 6 MG PO TABS: 6.0000 mg | ORAL_TABLET | ORAL | 0 refills | Status: AC

## 2021-01-17 MED ORDER — FUROSEMIDE 20 MG PO TABS: 20.0000 mg | ORAL_TABLET | ORAL | 11 refills | Status: AC

## 2021-01-17 MED ORDER — MIDODRINE HCL 10 MG PO TABS: 5.0000 mg | ORAL_TABLET | Freq: Three times a day (TID) | ORAL | 1 refills | Status: DC | PRN

## 2021-01-17 NOTE — Discharge Summary (Addendum)
Physician Discharge Summary  Sabrina Page [name] DOB: 1933-08-05 DOA: 01/13/2021  PCP: Cassandria Anger, MD  Admit date: 01/13/2021 Discharge date: 01/17/2021  Time spent: 60 minutes  Recommendations for Outpatient Follow-up:  Follow-up PCP in 2 weeks -Continue isolation for 10 days till 01/24/2021   Discharge Diagnoses:  Active Problems:   Depression   Falls frequently   Orthostatic hypotension dysautonomic syndrome   Alzheimer disease ([place])   Pneumonia due to COVID-19 virus   Acute respiratory failure with hypoxia ([place])   Discharge Condition: Stable  Diet recommendation: Diet recommendations: Dysphagia 3 (mechanical soft);Thin liquid Liquids provided via: Cup;No straw Medication Administration: Whole meds with puree Supervision: Patient able to self feed Compensations: Slow rate;Small sips/bites  Filed Weights   01/13/21 1658 01/16/21 0500 01/17/21 0500  Weight: 109.5 kg 106.3 kg 106 kg    History of present illness:  87 y.o. female with medical history significant of Alzheimer Dementia, OA, chronic low back pain, morbid obesity, adult failure to thrive, COPD/asthma, chronic diastolic congestive failure, essential hypertension, hyperlipidemia who has been bedbound for the last 1-2 months who presents to [facility] ED with fever, progressive shortness of breath, cough.  Positive contact with COVID infected individual (Husband).      Hospital Course:  Acute hypoxemic respiratory failure -Secondary to COVID-19 pneumonia -COVID test was positive on 01/13/2021 -Family refused remdesivir -Started on Decadron 6 mg p.o. daily for 10 days per protocol -Oxygen has been weaned off to room air -We will discharge on Decadron 6 mg daily for 5 more days  Stroke like symptoms -Symptoms present for more than 1 month -CT head was negative for stroke but showed atrophy -MRI brain without contrast today shows no acute abnormality -Speech therapy evaluation, patient started  on dysphagia 3 diet -PT OT was consulted, family wants to take patient home -Hydraulic lift, delivered to home  COPD/asthma -Patient is not O2 dependent at home -Oxygen has been weaned off to room air  Chronic diastolic CHF -TTE from 0000000 showed EF 55 to [id number], grade 2 diastolic dysfunction -Patient takes Lasix 20 mg every other day -Continue at the same dose  Essential hypertension -Patient becomes hypotensive at home and is on midodrine 5 mg p.o. twice daily -Patient blood pressure was significantly elevated in the hospital -She did not become hypotensive; we will change midodrine to 5 mg p.o. 3 times daily as needed for SBP less than 110 mmHg  Alzheimer's dementia -Follows with palliative care as outpatient, continue Namenda 10 mg p.o. twice daily  Orthostatic hypotension/dysautonomic syndrome -Patient is bedbound at baseline for past 2 months -Continue Florinef  Adult failure to thrive Patient is followed by [name].  She is apparently bedbound at baseline which is new over the last 1-2 months.   -Patient lives with husband with 24-7-hour caregivers present.    Procedures:   Consultations:   Discharge Exam: Vitals:   01/17/21 0559 01/17/21 1104  BP: (!) 193/94 133/62  Pulse: 65 61  Resp:  18  Temp: 98.2 F (36.8 C)   SpO2: 97% 96%    General: Appears in no acute distress Cardiovascular: S1-S2, regular Respiratory: Clear to auscultation bilaterally  Discharge Instructions   Discharge Instructions     Discharge instructions   Complete by: As directed    Diet recommendations: Dysphagia 3 (mechanical soft);Thin liquid Liquids provided via: Cup;No straw Medication Administration: Whole meds with puree Supervision: Patient able to self feed Compensations: Slow rate;Small sips/bites   Increase activity slowly  Complete by: As directed       Allergies as of 01/17/2021       Reactions   Atorvastatin Other (See Comments)    REACTION: upset stomach   Enalapril Maleate Other (See Comments)   Unknown reaction   Hctz [hydrochlorothiazide] Other (See Comments)   dizziness   Lovastatin Other (See Comments)   Unknown reaction   Namenda [memantine Hcl]    Feeling bad   Simvastatin Other (See Comments)   REACTION: mouth sores   Topamax [topiramate] Other (See Comments)   syncope        Medication List     STOP taking these medications    cephALEXin 500 MG capsule Commonly known as: KEFLEX       TAKE these medications    acetaminophen 500 MG tablet Commonly known as: TYLENOL Take 500 mg by mouth every 6 (six) hours as needed for headache (pain).   ARIPiprazole 2 MG tablet Commonly known as: ABILIFY TAKE 1 TABLET BY MOUTH EVERY DAY   aspirin 81 MG EC tablet Take 1 tablet (81 mg total) by mouth 2 (two) times daily.   B Complex Plus Tabs Take 1 tablet by mouth daily.   Calcium-Vitamin D-Vitamin K 500-200-90 MG-UNT-MCG Tabs 1 po qd What changed:  how much to take how to take this when to take this additional instructions   dexamethasone 6 MG tablet Commonly known as: DECADRON Take 1 tablet (6 mg total) by mouth daily for 5 days.   docusate sodium 100 MG capsule Commonly known as: Stool Softener Take 1 capsule (100 mg total) by mouth 2 (two) times daily. What changed: when to take this   fludrocortisone 0.1 MG tablet Commonly known as: FLORINEF Take 1 tablet (100 mcg total) by mouth daily. Annual appt due in Oct must see provider for future refills What changed: additional instructions   FLUoxetine 40 MG capsule Commonly known as: PROZAC TAKE 1 CAPSULE BY MOUTH EVERY DAY What changed:  how much to take how to take this when to take this additional instructions   furosemide 20 MG tablet Commonly known as: LASIX Take 1 tablet (20 mg total) by mouth every other day. What changed:  medication strength how much to take when to take this reasons to take this   loratadine  10 MG tablet Commonly known as: CLARITIN TAKE 1 TABLET BY MOUTH EVERY DAY IN THE MORNING What changed:  how much to take how to take this when to take this additional instructions   memantine 10 MG tablet Commonly known as: NAMENDA Take 1 tablet (10 mg total) by mouth 2 (two) times daily. Annual appt due in Oct must see provider for future refills   midodrine 10 MG tablet Commonly known as: PROAMATINE Take 0.5 tablets (5 mg total) by mouth 3 (three) times daily as needed (For SBP less than 110). May also take an additional '5mg'$  if BP gets too low What changed:  how much to take when to take this reasons to take this additional instructions   nystatin cream Commonly known as: MYCOSTATIN Apply 1 application topically daily as needed for dry skin (rash/irritation/itching).   omeprazole 20 MG capsule Commonly known as: PRILOSEC Take 2 capsules (40 mg total) by mouth daily.   OVER THE COUNTER MEDICATION Apply 1 application topically daily as needed (knee pain). Hemp Freeze   potassium chloride 8 MEQ tablet Commonly known as: KLOR-CON Take 1 tablet (8 mEq total) by mouth every morning.   pyridostigmine 60 MG  tablet Commonly known as: MESTINON Take 1 tablet (60 mg total) by mouth 3 (three) times daily.   traMADol 50 MG tablet Commonly known as: ULTRAM Take 1 tablet (50 mg total) by mouth every 12 (twelve) hours as needed for severe pain.   ZINC PO Take 1 tablet by mouth daily.               Durable Medical Equipment  (From admission, onward)           Start     Ordered   01/15/21 1109  For home use only DME Other see comment  Once       Comments: Museum/gallery conservator  Question:  Length of Need  Answer:  Lifetime   01/15/21 1108           Allergies  Allergen Reactions   Atorvastatin Other (See Comments)    REACTION: upset stomach   Enalapril Maleate Other (See Comments)    Unknown reaction   Hctz [Hydrochlorothiazide] Other (See Comments)    dizziness    Lovastatin Other (See Comments)    Unknown reaction   Namenda [Memantine Hcl]     Feeling bad   Simvastatin Other (See Comments)    REACTION: mouth sores   Topamax [Topiramate] Other (See Comments)    syncope      The results of significant diagnostics from this hospitalization (including imaging, microbiology, ancillary and laboratory) are listed below for reference.    Significant Diagnostic Studies: CT HEAD WO CONTRAST (5MM)  Result Date: 01/15/2021 CLINICAL DATA:  Stroke, follow-up. Failure to thrive. Weakness. Dementia. EXAM: CT HEAD WITHOUT CONTRAST TECHNIQUE: Contiguous axial images were obtained from the base of the skull through the vertex without intravenous contrast. COMPARISON:  Head CT 04/28/2020 FINDINGS: Brain: Generalized brain atrophy. Chronic small-vessel ischemic changes of the hemispheric white matter. Chronic ventriculomegaly consistent with central atrophy. No sign of acute infarction, mass lesion, hemorrhage, obstructive hydrocephalus or extra-axial collection. Normal pressure hydrocephalus can not be absolutely excluded on the basis of the imaging, but is not favored. Vascular: There is atherosclerotic calcification of the major vessels at the base of the brain. Skull: Negative.  Incidental hyperostosis interna. Sinuses/Orbits: Inflammatory changes of the left posterior ethmoid and sphenoid sinus which could relate to headache. This is similar to the study of last December. Other: None IMPRESSION: Generalized atrophy and chronic small-vessel ischemic change of the white matter. Chronic ventriculomegaly, essentially stable since December of last year. This is felt to be due to central atrophy. Normal pressure hydrocephalus is not favored but not absolutely excluded. Chronic opacification of the left posterior ethmoid region and left division of the sphenoid sinus. Electronically Signed   By: Nelson Chimes M.D.   On: 01/15/2021 14:24   DG Chest Port 1 View  Result Date:  01/13/2021 CLINICAL DATA:  Shortness of breath.  COVID positive. EXAM: PORTABLE CHEST 1 VIEW COMPARISON:  08/27/2020 and 04/18/2016. FINDINGS: Single view of the chest was obtained. There is vague opacity in the right upper chest just lateral to the anterior right first rib. This appears to be a chronic finding based on a chest radiograph from 2017. Heart size is within normal limits. No focal airspace disease or lung consolidation. Patient is slightly rotated on this examination. IMPRESSION: 1. No acute cardiopulmonary disease. Electronically Signed   By: Markus Daft M.D.   On: 01/13/2021 13:14    Microbiology: Recent Results (from the past 240 hour(s))  Resp Panel by RT-PCR (Flu A&B, Covid) Nasopharyngeal Swab  Status: Abnormal   Collection Time: 01/13/21 11:49 AM   Specimen: Nasopharyngeal Swab; Nasopharyngeal(NP) swabs in vial transport medium  Result Value Ref Range Status   SARS Coronavirus 2 by RT PCR POSITIVE (A) NEGATIVE Final    Comment: RESULT CALLED TO, READ BACK BY AND VERIFIED WITH: JACOB,D. RN '@1404'$  ON 08.23.2022 BY COHEN,K (NOTE) SARS-CoV-2 target nucleic acids are DETECTED.  The SARS-CoV-2 RNA is generally detectable in upper respiratory specimens during the acute phase of infection. Positive results are indicative of the presence of the identified virus, but do not rule out bacterial infection or co-infection with other pathogens not detected by the test. Clinical correlation with patient history and other diagnostic information is necessary to determine patient infection status. The expected result is Negative.  Fact Sheet for Patients: EntrepreneurPulse.com.au  Fact Sheet for Healthcare Providers: IncredibleEmployment.be  This test is not yet approved or cleared by the [place] FDA and  has been authorized for detection and/or diagnosis of SARS-CoV-2 by FDA under an Emergency Use Authorization (EUA).  This EUA will remain in  effect (meaning this te st can be used) for the duration of  the COVID-19 declaration under Section 564(b)(1) of the Act, 21 U.S.C. section 360bbb-3(b)(1), unless the authorization is terminated or revoked sooner.     Influenza A by PCR NEGATIVE NEGATIVE Final   Influenza B by PCR NEGATIVE NEGATIVE Final    Comment: (NOTE) The Xpert Xpress SARS-CoV-2/FLU/RSV plus assay is intended as an aid in the diagnosis of influenza from Nasopharyngeal swab specimens and should not be used as a sole basis for treatment. Nasal washings and aspirates are unacceptable for Xpert Xpress SARS-CoV-2/FLU/RSV testing.  Fact Sheet for Patients: EntrepreneurPulse.com.au  Fact Sheet for Healthcare Providers: IncredibleEmployment.be  This test is not yet approved or cleared by the [place] FDA and has been authorized for detection and/or diagnosis of SARS-CoV-2 by FDA under an Emergency Use Authorization (EUA). This EUA will remain in effect (meaning this test can be used) for the duration of the COVID-19 declaration under Section 564(b)(1) of the Act, 21 U.S.C. section 360bbb-3(b)(1), unless the authorization is terminated or revoked.  Performed at [facility], [place] [address], [place], [place] [address]      Labs: Basic Metabolic Panel: Recent Labs  Lab 01/13/21 1204 01/14/21 0415 01/15/21 0402 01/16/21 0418 01/17/21 0511  NA 134* 139 140 136 139  K 3.7 4.0 4.2 4.3 4.5  CL 96* 101 102 98 99  CO2 '28 28 31 27 29  '$ GLUCOSE 90 138* 152* 134* 128*  BUN '13 13 17 18 17  '$ CREATININE 0.87 0.75 0.73 0.74 0.85  CALCIUM 8.4* 8.9 9.1 8.7* 9.2   Liver Function Tests: Recent Labs  Lab 01/13/21 1204 01/14/21 0415 01/15/21 0402 01/16/21 0418 01/17/21 0511  AST '19 18 16 16 16  '$ ALT '15 16 14 14 16  '$ ALKPHOS 59 64 56 52 56  BILITOT 0.7 0.6 0.7 0.7 1.0  PROT 6.2* 6.8 6.6 6.2* 6.9  ALBUMIN 3.3* 3.4* 3.4* 3.2* 3.6    CBC: Recent Labs  Lab  01/13/21 1204 01/14/21 0415 01/15/21 0402 01/16/21 0418 01/17/21 0848  WBC 3.8* 2.6* 2.8* 3.5* 4.5  NEUTROABS 2.1 1.8 1.9 2.5 2.6  HGB 13.7 14.9 14.5 14.7 15.2*  HCT 42.7 46.9* 45.7 46.4* 46.7*  MCV 90.9 91.4 90.1 91.5 88.6  PLT 122* 129* 141* 135* 168       Signed:  Oswald Hillock MD.  [facility] 01/17/2021, 3:51 PM

## 2021-01-17 NOTE — TOC Transition Note (Signed)
Transition of Care [facility]) - CM/SW Discharge Note   Patient Details  Name: Sabrina Page MRN: [id number] Date of Birth: 1933-12-06  [facility] CM/SW Contact:  Lynnell Catalan, RN Phone Number: 01/17/2021, 3:55 PM   Clinical Narrative:    Pt to dc home via [facility] today. Yellow DNR to be signed by MD and sent home with pt. Spoke with daughter via phone who states that pt husband and caregiver are at the home to accept her. [facility] contacted for transport.  Daughter states that the hoyer lift that was brought by [employer] yesterday does not have the correct charger. [employer] contacted to alert that pt is in need of a charger.    Final next level of care: Home/Self Care Barriers to Discharge: No Barriers Identified   Patient Goals and CMS Choice Patient states their goals for this hospitalization and ongoing recovery are:: To get better CMS Medicare.gov Compare Post Acute Care list provided to:: Patient Choice offered to / list presented to : Patient, Adult Children  Readmission Risk Interventions No flowsheet data found.

## 2021-01-17 NOTE — Discharge Instructions (Signed)
Continue isolation at home for 5 more days till 01/24/2021

## 2021-01-17 NOTE — Progress Notes (Signed)
Discharge instructions called to daughter, [name]. Copy of AVS  to be sent home with patient. [facility] arranged for transport.Eulas Post, RN

## 2021-02-06 ENCOUNTER — Other Ambulatory Visit: Payer: Self-pay

## 2021-02-06 ENCOUNTER — Inpatient Hospital Stay (HOSPITAL_COMMUNITY)
Admission: EM | Admit: 2021-02-06 | Discharge: 2021-02-11 | DRG: 480 | Disposition: A | Discharge status: Home Health | Payer: Medicare Other | Source: Skilled Nursing Facility | Attending: Internal Medicine | Admitting: Internal Medicine

## 2021-02-06 ENCOUNTER — Encounter (HOSPITAL_COMMUNITY): Payer: Self-pay

## 2021-02-06 ENCOUNTER — Emergency Department (HOSPITAL_COMMUNITY): Payer: Medicare Other

## 2021-02-06 ENCOUNTER — Inpatient Hospital Stay (HOSPITAL_COMMUNITY): Payer: Medicare Other

## 2021-02-06 DIAGNOSIS — S72401A Unspecified fracture of lower end of right femur, initial encounter for closed fracture: Secondary | ICD-10-CM | POA: Diagnosis present

## 2021-02-06 DIAGNOSIS — J449 Chronic obstructive pulmonary disease, unspecified: Secondary | ICD-10-CM | POA: Diagnosis present

## 2021-02-06 DIAGNOSIS — T148XXA Other injury of unspecified body region, initial encounter: Secondary | ICD-10-CM

## 2021-02-06 DIAGNOSIS — I951 Orthostatic hypotension: Secondary | ICD-10-CM

## 2021-02-06 DIAGNOSIS — Z01810 Encounter for preprocedural cardiovascular examination: Secondary | ICD-10-CM

## 2021-02-06 DIAGNOSIS — F418 Other specified anxiety disorders: Secondary | ICD-10-CM

## 2021-02-06 DIAGNOSIS — U071 COVID-19: Secondary | ICD-10-CM

## 2021-02-06 DIAGNOSIS — F0280 Dementia in other diseases classified elsewhere without behavioral disturbance: Secondary | ICD-10-CM

## 2021-02-06 DIAGNOSIS — K219 Gastro-esophageal reflux disease without esophagitis: Secondary | ICD-10-CM

## 2021-02-06 DIAGNOSIS — I5032 Chronic diastolic (congestive) heart failure: Secondary | ICD-10-CM | POA: Diagnosis present

## 2021-02-06 DIAGNOSIS — R9431 Abnormal electrocardiogram [ECG] [EKG]: Secondary | ICD-10-CM | POA: Diagnosis present

## 2021-02-06 DIAGNOSIS — S728X1A Other fracture of right femur, initial encounter for closed fracture: Principal | ICD-10-CM

## 2021-02-06 DIAGNOSIS — J9601 Acute respiratory failure with hypoxia: Secondary | ICD-10-CM | POA: Diagnosis not present

## 2021-02-06 DIAGNOSIS — W07XXXA Fall from chair, initial encounter: Secondary | ICD-10-CM | POA: Diagnosis present

## 2021-02-06 DIAGNOSIS — Z96652 Presence of left artificial knee joint: Secondary | ICD-10-CM | POA: Diagnosis present

## 2021-02-06 DIAGNOSIS — J9811 Atelectasis: Secondary | ICD-10-CM | POA: Diagnosis not present

## 2021-02-06 DIAGNOSIS — Y92098 Other place in other non-institutional residence as the place of occurrence of the external cause: Secondary | ICD-10-CM | POA: Diagnosis not present

## 2021-02-06 DIAGNOSIS — S72391A Other fracture of shaft of right femur, initial encounter for closed fracture: Principal | ICD-10-CM | POA: Diagnosis present

## 2021-02-06 DIAGNOSIS — G4733 Obstructive sleep apnea (adult) (pediatric): Secondary | ICD-10-CM | POA: Diagnosis present

## 2021-02-06 DIAGNOSIS — Z87891 Personal history of nicotine dependence: Secondary | ICD-10-CM

## 2021-02-06 DIAGNOSIS — G901 Familial dysautonomia [Riley-Day]: Secondary | ICD-10-CM | POA: Diagnosis present

## 2021-02-06 DIAGNOSIS — M47816 Spondylosis without myelopathy or radiculopathy, lumbar region: Secondary | ICD-10-CM | POA: Diagnosis not present

## 2021-02-06 DIAGNOSIS — Z79899 Other long term (current) drug therapy: Secondary | ICD-10-CM | POA: Diagnosis not present

## 2021-02-06 DIAGNOSIS — S72401P Unspecified fracture of lower end of right femur, subsequent encounter for closed fracture with malunion: Secondary | ICD-10-CM | POA: Diagnosis not present

## 2021-02-06 DIAGNOSIS — Z7401 Bed confinement status: Secondary | ICD-10-CM | POA: Diagnosis not present

## 2021-02-06 DIAGNOSIS — R296 Repeated falls: Secondary | ICD-10-CM | POA: Diagnosis present

## 2021-02-06 DIAGNOSIS — S79911A Unspecified injury of right hip, initial encounter: Secondary | ICD-10-CM | POA: Diagnosis not present

## 2021-02-06 DIAGNOSIS — F419 Anxiety disorder, unspecified: Secondary | ICD-10-CM | POA: Diagnosis present

## 2021-02-06 DIAGNOSIS — Z96641 Presence of right artificial hip joint: Secondary | ICD-10-CM | POA: Diagnosis present

## 2021-02-06 DIAGNOSIS — G309 Alzheimer's disease, unspecified: Secondary | ICD-10-CM | POA: Diagnosis present

## 2021-02-06 DIAGNOSIS — Z8616 Personal history of COVID-19: Secondary | ICD-10-CM | POA: Diagnosis not present

## 2021-02-06 DIAGNOSIS — F32A Depression, unspecified: Secondary | ICD-10-CM | POA: Diagnosis present

## 2021-02-06 DIAGNOSIS — Z66 Do not resuscitate: Secondary | ICD-10-CM | POA: Diagnosis present

## 2021-02-06 DIAGNOSIS — S79929A Unspecified injury of unspecified thigh, initial encounter: Secondary | ICD-10-CM | POA: Diagnosis not present

## 2021-02-06 DIAGNOSIS — Z8249 Family history of ischemic heart disease and other diseases of the circulatory system: Secondary | ICD-10-CM

## 2021-02-06 DIAGNOSIS — I959 Hypotension, unspecified: Secondary | ICD-10-CM | POA: Diagnosis not present

## 2021-02-06 DIAGNOSIS — I5042 Chronic combined systolic (congestive) and diastolic (congestive) heart failure: Secondary | ICD-10-CM | POA: Diagnosis present

## 2021-02-06 DIAGNOSIS — M25461 Effusion, right knee: Secondary | ICD-10-CM | POA: Diagnosis not present

## 2021-02-06 DIAGNOSIS — G609 Hereditary and idiopathic neuropathy, unspecified: Secondary | ICD-10-CM | POA: Diagnosis present

## 2021-02-06 DIAGNOSIS — Z01818 Encounter for other preprocedural examination: Secondary | ICD-10-CM | POA: Diagnosis not present

## 2021-02-06 DIAGNOSIS — S72451A Displaced supracondylar fracture without intracondylar extension of lower end of right femur, initial encounter for closed fracture: Secondary | ICD-10-CM | POA: Diagnosis not present

## 2021-02-06 HISTORY — DX: Dementia in other diseases classified elsewhere, unspecified severity, without behavioral disturbance, psychotic disturbance, mood disturbance, and anxiety: F02.80

## 2021-02-06 HISTORY — DX: Chronic obstructive pulmonary disease, unspecified: J44.9

## 2021-02-06 HISTORY — DX: Gastro-esophageal reflux disease without esophagitis: K21.9

## 2021-02-06 HISTORY — DX: Other specified anxiety disorders: F41.8

## 2021-02-06 HISTORY — DX: Orthostatic hypotension: I95.1

## 2021-02-06 HISTORY — DX: Chronic diastolic (congestive) heart failure: I50.32

## 2021-02-06 HISTORY — DX: COVID-19: U07.1

## 2021-02-06 HISTORY — DX: Dementia in other diseases classified elsewhere without behavioral disturbance: F02.80

## 2021-02-06 LAB — CBC WITH DIFFERENTIAL/PLATELET
Abs Immature Granulocytes: 0.1 10*3/uL — ABNORMAL HIGH (ref 0.00–0.07)
Basophils Absolute: 0 10*3/uL (ref 0.0–0.1)
Basophils Relative: 0 %
Eosinophils Absolute: 0.1 10*3/uL (ref 0.0–0.5)
Eosinophils Relative: 1 %
HCT: 44.5 % (ref 36.0–46.0)
Hemoglobin: 13.7 g/dL (ref 12.0–15.0)
Immature Granulocytes: 1 %
Lymphocytes Relative: 14 %
Lymphs Abs: 1.2 10*3/uL (ref 0.7–4.0)
MCH: 29 pg (ref 26.0–34.0)
MCHC: 30.8 g/dL (ref 30.0–36.0)
MCV: 94.1 fL (ref 80.0–100.0)
Monocytes Absolute: 0.6 10*3/uL (ref 0.1–1.0)
Monocytes Relative: 7 %
Neutro Abs: 6.6 10*3/uL (ref 1.7–7.7)
Neutrophils Relative %: 77 %
Platelets: 145 10*3/uL — ABNORMAL LOW (ref 150–400)
RBC: 4.73 MIL/uL (ref 3.87–5.11)
RDW: 13.6 % (ref 11.5–15.5)
WBC: 8.6 10*3/uL (ref 4.0–10.5)
nRBC: 0 % (ref 0.0–0.2)

## 2021-02-06 LAB — BASIC METABOLIC PANEL
Anion gap: 6 (ref 5–15)
BUN: 13 mg/dL (ref 8–23)
CO2: 31 mmol/L (ref 22–32)
Calcium: 9.2 mg/dL (ref 8.9–10.3)
Chloride: 107 mmol/L (ref 98–111)
Creatinine, Ser: 0.88 mg/dL (ref 0.44–1.00)
GFR, Estimated: >60 mL/min (ref >60)
Glucose, Bld: 120 mg/dL — ABNORMAL HIGH (ref 70–99)
Potassium: 4.3 mmol/L (ref 3.5–5.1)
Sodium: 144 mmol/L (ref 135–145)

## 2021-02-06 LAB — TYPE AND SCREEN
ABO/RH(D): B POS
Antibody Screen: NEGATIVE

## 2021-02-06 MED ORDER — ONDANSETRON HCL 4 MG/2ML IJ SOLN
4.0000 mg | Freq: Once | INTRAMUSCULAR | Status: AC
  Administered 2021-02-06: 4 mg via INTRAVENOUS
  Filled 2021-02-06: qty 2

## 2021-02-06 MED ORDER — ACETAMINOPHEN 325 MG PO TABS: 650.0000 mg | ORAL_TABLET | Freq: Four times a day (QID) | ORAL | Status: DC | PRN

## 2021-02-06 MED ORDER — OXYCODONE HCL 5 MG PO TABS: 2.5000 mg | ORAL_TABLET | ORAL | Status: DC | PRN

## 2021-02-06 MED ORDER — SODIUM CHLORIDE 0.9 % IV BOLUS
500.0000 mL | Freq: Once | INTRAVENOUS | Status: AC
  Administered 2021-02-06: 500 mL via INTRAVENOUS

## 2021-02-06 MED ORDER — ONDANSETRON HCL 4 MG PO TABS: 4.0000 mg | ORAL_TABLET | Freq: Four times a day (QID) | ORAL | Status: DC | PRN

## 2021-02-06 MED ORDER — ONDANSETRON HCL 4 MG/2ML IJ SOLN: 4.0000 mg | Freq: Four times a day (QID) | INTRAMUSCULAR | Status: DC | PRN

## 2021-02-06 MED ORDER — MORPHINE SULFATE (PF) 4 MG/ML IV SOLN
4.0000 mg | Freq: Once | INTRAVENOUS | Status: AC
  Administered 2021-02-06: 4 mg via INTRAVENOUS
  Filled 2021-02-06: qty 1

## 2021-02-06 MED ORDER — ACETAMINOPHEN 650 MG RE SUPP: 650.0000 mg | Freq: Four times a day (QID) | RECTAL | Status: DC | PRN

## 2021-02-06 MED ORDER — HYDROMORPHONE HCL 1 MG/ML IJ SOLN
0.5000 mg | INTRAMUSCULAR | Status: DC | PRN
  Administered 2021-02-07 (×2): 0.5 mg via INTRAVENOUS
  Filled 2021-02-06 (×2): qty 1

## 2021-02-06 MED ORDER — SENNOSIDES-DOCUSATE SODIUM 8.6-50 MG PO TABS
1.0000 | ORAL_TABLET | Freq: Two times a day (BID) | ORAL | Status: DC
  Administered 2021-02-07 – 2021-02-11 (×8): 1 via ORAL
  Filled 2021-02-06 (×9): qty 1

## 2021-02-06 NOTE — ED Triage Notes (Signed)
Patient BIB [facility] from home after fall off her lift chair. Her right leg was caught under it, obvious shorting and rotating to the right hip. Patient was crying out in pain. Pulses equally strong in both feet. Patient naturally has a rotating knee cap. Patient speaks [place].  EMS gave 111mg Fentanyl IM

## 2021-02-06 NOTE — H&P (Signed)
History and Physical    Sabrina Page [id number] DOB: Jun 12, 1933 DOA: 02/06/2021  PCP: Cassandria Anger, MD   Patient coming from: Assisted living.   I have personally briefly reviewed patient's old medical records in [place]  Chief Complaint: Fall.  HPI: Sabrina Page is a 87 y.o. female with medical history significant of anxiety, chronic diastolic heart failure, COPD, Alzheimer's dementia, GERD, orthostatic hypotension who was admitted and discharged last month due to COVID-19 and is brought from her assisted living facility due to having a mechanical fall.  HPI is taken from the patient's daughter given the translation and dementia communication limitations.  The patient's daughter stated that she is usually able to stand but does not ambulate.  No complaints of headache, chest pain, back or abdominal pain.  ED Course: 98.8 F, pulse 73, respirations 20, BP 130/67 mmHg O2 sat 98% on room air.  The patient received a 500 mL NS bolus, morphine 4 mg IVP x1 and Zofran 4 mg IVP x1.  Lab work: CBC showed a white count of 8.6, hemoglobin 13.7 g/dL and platelets 145.  BMP showed a glucose of 120 mg/dL but was otherwise unremarkable.  Imaging: One-view portable chest radiograph did not show any active cardiopulmonary disease.  Right hip x-ray did not show any acute abnormality.  Right knee showed an acute fracture of the distal right femoral shaft with severe DJD and small effusion.  Please see images and full radiology report for further detail.  Review of Systems: As per HPI otherwise all other systems reviewed and are negative.  Past Medical History:  Diagnosis Date   Anxiety with depression 02/06/2021   Chronic diastolic heart failure ([place]) 02/06/2021   COPD (chronic obstructive pulmonary disease) ([place]) 02/06/2021   COVID-19 virus infection 02/06/2021   Dementia due to Alzheimer's disease ([place]) 02/06/2021   GERD (gastroesophageal reflux disease) 02/06/2021    Orthostatic hypotension 02/06/2021   Past Surgical History:  Procedure Laterality Date   BREAST SURGERY     due to mastitis   CHOLECYSTECTOMY     HIP ARTHROPLASTY Right    TOTAL KNEE ARTHROPLASTY Left    Social History  reports that she has quit smoking. Her smoking use included cigarettes. She has never used smokeless tobacco. She reports current alcohol use. She reports that she does not use drugs.  No Known Allergies  Family History  Problem Relation Age of Onset   Hypertension Mother    Stroke Mother    Stroke Father    Hypertension Father    Discharge medications:  acetaminophen 500 MG tablet Commonly known as: TYLENOL Take 500 mg by mouth every 6 (six) hours as needed for headache (pain).    ARIPiprazole 2 MG tablet Commonly known as: ABILIFY TAKE 1 TABLET BY MOUTH EVERY DAY    aspirin 81 MG EC tablet Take 1 tablet (81 mg total) by mouth 2 (two) times daily.    B Complex Plus Tabs Take 1 tablet by mouth daily.    Calcium-Vitamin D-Vitamin K 500-200-90 MG-UNT-MCG Tabs 1 po qd  dexamethasone 6 MG tablet Commonly known as: DECADRON Take 1 tablet (6 mg total) by mouth daily for 5 days.    docusate sodium 100 MG capsule Commonly known as: Stool Softener Take 1 capsule (100 mg total) by mouth 2 (two) times daily.    fludrocortisone 0.1 MG tablet Commonly known as: FLORINEF Take 1 tablet (100 mcg total) by mouth daily. Annual appt due in Oct must see provider for future  refills   FLUoxetine 40 MG capsule Commonly known as: PROZAC TAKE 1 CAPSULE BY MOUTH EVERY DAY    furosemide 20 MG tablet Commonly known as: LASIX Take 1 tablet (20 mg total) by mouth every other day.    loratadine 10 MG tablet Commonly known as: CLARITIN TAKE 1 TABLET BY MOUTH EVERY DAY IN THE MORNING    memantine 10 MG tablet Commonly known as: NAMENDA Take 1 tablet (10 mg total) by mouth 2 (two) times daily. Annual appt due in Oct must see provider for future refills    midodrine 10  MG tablet Commonly known as: PROAMATINE Take 0.5 tablets (5 mg total) by mouth 3 (three) times daily as needed (For SBP less than 110). May also take an additional '5mg'$  if BP gets too low    nystatin cream Commonly known as: MYCOSTATIN Apply 1 application topically daily as needed for dry skin (rash/irritation/itching).    omeprazole 20 MG capsule Commonly known as: PRILOSEC Take 2 capsules (40 mg total) by mouth daily.    OVER THE COUNTER MEDICATION Apply 1 application topically daily as needed (knee pain). Hemp Freeze    potassium chloride 8 MEQ tablet Commonly known as: KLOR-CON Take 1 tablet (8 mEq total) by mouth every morning.    pyridostigmine 60 MG tablet Commonly known as: MESTINON Take 1 tablet (60 mg total) by mouth 3 (three) times daily.    traMADol 50 MG tablet Commonly known as: ULTRAM Take 1 tablet (50 mg total) by mouth every 12 (twelve) hours as needed for severe pain.    ZINC PO Take 1 tablet by mouth daily.    Physical Exam: Vitals:   02/06/21 2046 02/06/21 2131 02/06/21 2230 02/06/21 2301  BP: 130/67 (!) 121/37 135/60 (!) 122/48  Pulse: 73 73 71 77  Resp: '20 20 18 16  '$ Temp: 98.8 F (37.1 C)     TempSrc: Oral     SpO2: 98% 95% 97% 94%   Constitutional: Chronically ill-appearing.  NAD, calm, comfortable Eyes: PERRL, lids and conjunctivae normal ENMT: Mucous membranes are mildly dry.  Posterior pharynx clear of any exudate or lesions. Neck: normal, supple, no masses, no thyromegaly Respiratory: Poor inspiration with decreased breath sounds in bases, otherwise clear to auscultation bilaterally, no wheezing, no crackles. Normal respiratory effort. No accessory muscle use.  Cardiovascular: Regular rate and rhythm, no murmurs / rubs / gallops. No extremity edema. 2+ pedal pulses. No carotid bruits.  Abdomen: Obese, no distention.  Bowel sounds positive.  Soft, no tenderness, no masses palpated. No hepatosplenomegaly. Musculoskeletal: Moderate generalized  weakness.  Shortened RLE.  Right knee edema and TTP.  Good pulses and capillary refill.  Severely limited ROM of RLE.  No clubbing / cyanosis.  No contractures. Normal muscle tone.  Skin: no acute rashes, lesions, ulcers on very limited dermatological examination. Neurologic: CN 2-12 grossly intact. Sensation intact, DTR normal. Strength 5/5 in all 4.  Psychiatric: Oriented to person, knows she is in the hospital.  Labs on Admission: I have personally reviewed following labs and imaging studies  CBC: Recent Labs  Lab 02/06/21 2210  WBC 8.6  NEUTROABS 6.6  HGB 13.7  HCT 44.5  MCV 94.1  PLT 145*    Basic Metabolic Panel: Recent Labs  Lab 02/06/21 2210  NA 144  K 4.3  CL 107  CO2 31  GLUCOSE 120*  BUN 13  CREATININE 0.88  CALCIUM 9.2    GFR: CrCl cannot be calculated (Unknown ideal weight.).  Liver Function  Tests: No results for input(s): AST, ALT, ALKPHOS, BILITOT, PROT, ALBUMIN in the last 168 hours.  Urine analysis: No results found for: COLORURINE, APPEARANCEUR, LABSPEC, [facility], GLUCOSEU, HGBUR, BILIRUBINUR, KETONESUR, PROTEINUR, UROBILINOGEN, NITRITE, LEUKOCYTESUR  Radiological Exams on Admission: Chest Portable 1 View  Result Date: 02/06/2021 CLINICAL DATA:  Preop knee surgery EXAM: PORTABLE CHEST 1 VIEW COMPARISON:  01/13/2021 FINDINGS: The heart size and mediastinal contours are within normal limits. Both lungs are clear. The visualized skeletal structures are unremarkable. Low lung volumes. IMPRESSION: No active disease. Electronically Signed   By: Donavan Foil M.D.   On: 02/06/2021 23:20   DG Knee Complete 4 Views Right  Result Date: 02/06/2021 CLINICAL DATA:  Status post fall. EXAM: RIGHT KNEE - COMPLETE 4+ VIEW COMPARISON:  None. FINDINGS: Acute fracture deformity is seen involving the distal right femoral shaft. Medial angulation of the distal fracture site is seen. There is no evidence of dislocation. There is marked severity medial and lateral  tibiofemoral compartment space narrowing. A small joint effusion is seen. IMPRESSION: 1. Acute fracture of the distal right femoral shaft. 2. Marked severity degenerative changes. 3. Small joint effusion. Electronically Signed   By: Virgina Norfolk M.D.   On: 02/06/2021 21:30   DG Hip Unilat W or Wo Pelvis 2-3 Views Right  Result Date: 02/06/2021 CLINICAL DATA:  Fall, injury. EXAM: DG HIP (WITH OR WITHOUT PELVIS) 2-3V RIGHT COMPARISON:  None. FINDINGS: The bones are diffusely osteopenic. This limits evaluation for subtle nondisplaced fracture. A right hip arthroplasty appears in anatomic alignment. There is no acute fracture or hardware loosening identified. Soft tissues are within normal limits. Degenerative changes affect the lumbar spine. IMPRESSION: 1. Examination is limited by osteopenia. No definite acute fracture or dislocation. 2. Right hip arthroplasty in anatomic alignment. Electronically Signed   By: Ronney Asters M.D.   On: 02/06/2021 21:32    01/19/2016 echo  -------------------------------------------------------------------  LV EF: 55% -   60%   -------------------------------------------------------------------  Indications:      Dyspnea 786.09.   -------------------------------------------------------------------  History:   PMH:  Orthostatic hypotension. DOE. Asthma/COPD.  Recurrent falls at home. OSA. Anxiety.   -------------------------------------------------------------------  Study Conclusions   - Left ventricle: The cavity size was normal. Wall thickness was    increased in a pattern of moderate LVH. Systolic function was    normal. The estimated ejection fraction was in the range of 55%    to 60%. Wall motion was normal; there were no regional wall    motion abnormalities. Doppler parameters are consistent with    pseudormal left ventricular relaxation (grade 2 diastolic    dysfunction). The E/e&' ratio is between 8-15, suggesting    indeterminate LV fillig  pressure.  - Aortic valve: Trileaflet. Sclerosis without stenosis.  - Mitral valve: Mildly thickened leaflets . There was trivial    regurgitation.  - Left atrium: The atrium was mildly dilated.  - Atrial septum: A patent foramen ovale cannot be excluded.  - Tricuspid valve: There was trivial regurgitation.  - Pulmonary arteries: PA peak pressure: 28 mm Hg (S).  - Inferior vena cava: The vessel was normal in size. The    respirophasic diameter changes were in the normal range (>= 50%),    consistent with normal central venous pressure.   Impressions:   - Compared to a prior echo in 2015, there are few changes except    there is now moderate LVH.   EKG: Independently reviewed.  Vent. rate 82 BPM PR interval 170  ms QRS duration 93 ms QT/QTcB 440/514 ms P-R-T axes 47 -6 6 Sinus rhythm LVH by voltage Prolonged QT interval  Assessment/Plan Principal Problem:   Closed fracture of right distal femur (HCC) Admit to telemetry/inpatient. Keep NPO. Buck's traction per protocol. Analgesics as needed. Antiemetics as needed. Consult TOC team. Orthopedic surgery will evaluate in AM.  Active Problems:   Prolonged QT interval Magnesium sulfate 2 g IVPB x1. Avoid QT prolonging medications.    Dementia due to Alzheimer's disease ([place]) Continue memantine 10 mg p.o. twice daily.    Anxiety with depression Continue Abilify 2 mg p.o. daily.    Orthostatic hypotension Continue midodrine.    COPD (chronic obstructive pulmonary disease) (HCC) Supplemental oxygen bronchodilators as needed.    Chronic diastolic heart failure (HCC) No signs of decompensation.    GERD (gastroesophageal reflux disease) Continue PPI.    On pyridostigmine  Does the patient have myasthenia gravis? I was unable to find MG on PMH or recent documentation.   DVT prophylaxis: SCDs. Code Status:   DNR. Family Communication:  Her daughter Sabrina Page was present in the ED room. Disposition Plan:   Patient is  from:  Assisted living.  Anticipated DC to:  SNF.  Anticipated DC date:  02/10/2021.  Anticipated DC barriers: Clinical status/consultant sign off. Consults called:  Orthopedic surgery was contacted by the ED. Admission status:  Inpatient/telemetry.   Severity of Illness: High severity after presenting with right distal femoral fracture after having a fall at the assisted living facility.  The patient will need to be surgically intervened.  Reubin Milan MD [facility]  How to contact the [facility] Attending or Consulting provider [place] or covering provider during after hours [place], for this patient?   Check the care team in [facility] and look for a) attending/consulting [facility] provider listed and b) the [facility] team listed Log into [email or web address] and use [place]'s universal password to access. If you do not have the password, please contact the hospital operator. Locate the [facility] provider you are looking for under [facility] and page to a number that you can be directly reached. If you still have difficulty reaching the provider, please page the [facility] (Director on Call) for the Hospitalists listed on amion for assistance.  02/06/2021, 11:35 PM   This document was prepared using Dragon voice recognition software may contain some unintended transcription errors.

## 2021-02-06 NOTE — ED Provider Notes (Signed)
[facility] DEPT Provider Note   CSN: [id number] Arrival date & time: 02/06/21  2036     History Chief Complaint  Patient presents with   Fall   Hip Pain    Sabrina Page is a 87 y.o. female.  HPI  87 year old female with previous right hip replacement presents the emergency department with right leg pain after mechanical fall.  It is reported that the patient was being lifted out of a chair at her facility when she fell down onto the right knee.  Patient is complaining of immediate pain.  Patient is standing and weightbearing but otherwise nonambulatory per the daughter.  Patient is a minimal historian.  There was no reported syncope, head injury, loss of consciousness, seizure activity.  Patient is not on any anticoagulation.  History reviewed. No pertinent past medical history.  There are no problems to display for this patient.   History reviewed. No pertinent surgical history.   OB History   No obstetric history on file.     History reviewed. No pertinent family history.  Social History   Tobacco Use   Smoking status: Unknown    Home Medications Prior to Admission medications   Not on File    Allergies    Patient has no known allergies.  Review of Systems   Review of Systems  Unable to perform ROS: Dementia   Physical Exam Updated Vital Signs BP 135/60   Pulse 71   Temp 98.8 F (37.1 C) (Oral)   Resp 18   SpO2 97%   Physical Exam Vitals and nursing note reviewed.  HENT:     Head: Normocephalic.     Mouth/Throat:     Mouth: Mucous membranes are moist.  Eyes:     Pupils: Pupils are equal, round, and reactive to light.  Cardiovascular:     Rate and Rhythm: Normal rate.  Pulmonary:     Effort: Pulmonary effort is normal. No respiratory distress.  Musculoskeletal:        General: Swelling present.     Comments: Swollen right knee, TTP and with ROM, leg is neurovasc intact, right hip non tender  Skin:     General: Skin is warm.  Neurological:     Mental Status: She is alert. Mental status is at baseline.    ED Results / Procedures / Treatments   Labs (all labs ordered are listed, but only abnormal results are displayed) Labs Reviewed  RESP PANEL BY RT-PCR (FLU A&B, COVID) ARPGX2  CBC WITH DIFFERENTIAL/PLATELET  BASIC METABOLIC PANEL  TYPE AND SCREEN    EKG None  Radiology DG Knee Complete 4 Views Right  Result Date: 02/06/2021 CLINICAL DATA:  Status post fall. EXAM: RIGHT KNEE - COMPLETE 4+ VIEW COMPARISON:  None. FINDINGS: Acute fracture deformity is seen involving the distal right femoral shaft. Medial angulation of the distal fracture site is seen. There is no evidence of dislocation. There is marked severity medial and lateral tibiofemoral compartment space narrowing. A small joint effusion is seen. IMPRESSION: 1. Acute fracture of the distal right femoral shaft. 2. Marked severity degenerative changes. 3. Small joint effusion. Electronically Signed   By: Virgina Norfolk M.D.   On: 02/06/2021 21:30   DG Hip Unilat W or Wo Pelvis 2-3 Views Right  Result Date: 02/06/2021 CLINICAL DATA:  Fall, injury. EXAM: DG HIP (WITH OR WITHOUT PELVIS) 2-3V RIGHT COMPARISON:  None. FINDINGS: The bones are diffusely osteopenic. This limits evaluation for subtle nondisplaced fracture. A right hip  arthroplasty appears in anatomic alignment. There is no acute fracture or hardware loosening identified. Soft tissues are within normal limits. Degenerative changes affect the lumbar spine. IMPRESSION: 1. Examination is limited by osteopenia. No definite acute fracture or dislocation. 2. Right hip arthroplasty in anatomic alignment. Electronically Signed   By: Ronney Asters M.D.   On: 02/06/2021 21:32    Procedures Procedures   Medications Ordered in ED Medications  ondansetron [facility]) injection 4 mg (4 mg Intravenous Given 02/06/21 2229)  sodium chloride 0.9 % bolus 500 mL (500 mLs Intravenous New  Bag/Given 02/06/21 2229)  morphine 4 MG/ML injection 4 mg (4 mg Intravenous Given 02/06/21 2230)    ED Course  I have reviewed the triage vital signs and the nursing notes.  Pertinent labs & imaging results that were available during my care of the patient were reviewed by me and considered in my medical decision making (see chart for details).    MDM Rules/Calculators/A&P                           87 year old 5 female presents to the emergency department from facility with a fall down onto the right knee and right knee pain.  Vitals are stable on arrival, right knee is swollen and tender to palpation, otherwise neurovascularly intact. No LOC or head injury.  X-ray imaging shows a distal right femur fracture.  Spoke with Dr. Percell Miller, on-call orthopedic surgeon who recommends preoperative evaluation and possible surgical intervention tomorrow.  Daughter is at bedside and makes the patients decisions. Patient stable at time of admission.  Final Clinical Impression(s) / ED Diagnoses Final diagnoses:  Other closed fracture of right femur, unspecified portion of femur, initial encounter [name])    Rx / DC Orders ED Discharge Orders     None        Lorelle Gibbs, DO 02/06/21 2256

## 2021-02-07 ENCOUNTER — Inpatient Hospital Stay: Admit: 2021-02-07 | Payer: Medicare Other | Admitting: Orthopedic Surgery

## 2021-02-07 ENCOUNTER — Encounter (HOSPITAL_COMMUNITY)
Admission: EM | Disposition: A | Discharge status: Home Health | Payer: Self-pay | Source: Skilled Nursing Facility | Attending: Internal Medicine

## 2021-02-07 ENCOUNTER — Inpatient Hospital Stay (HOSPITAL_COMMUNITY): Payer: Medicare Other

## 2021-02-07 ENCOUNTER — Inpatient Hospital Stay (HOSPITAL_COMMUNITY): Payer: Medicare Other | Admitting: Certified Registered Nurse Anesthetist

## 2021-02-07 DIAGNOSIS — R9431 Abnormal electrocardiogram [ECG] [EKG]: Secondary | ICD-10-CM | POA: Diagnosis present

## 2021-02-07 DIAGNOSIS — G309 Alzheimer's disease, unspecified: Secondary | ICD-10-CM | POA: Diagnosis not present

## 2021-02-07 DIAGNOSIS — F0280 Dementia in other diseases classified elsewhere without behavioral disturbance: Secondary | ICD-10-CM

## 2021-02-07 DIAGNOSIS — I5032 Chronic diastolic (congestive) heart failure: Secondary | ICD-10-CM | POA: Diagnosis not present

## 2021-02-07 DIAGNOSIS — F418 Other specified anxiety disorders: Secondary | ICD-10-CM

## 2021-02-07 DIAGNOSIS — S72401A Unspecified fracture of lower end of right femur, initial encounter for closed fracture: Secondary | ICD-10-CM | POA: Diagnosis not present

## 2021-02-07 HISTORY — PX: ORIF FEMUR FRACTURE: SHX2119

## 2021-02-07 LAB — RESP PANEL BY RT-PCR (FLU A&B, COVID) ARPGX2
Influenza A by PCR: NEGATIVE
Influenza B by PCR: NEGATIVE
SARS Coronavirus 2 by RT PCR: NEGATIVE

## 2021-02-07 LAB — COMPREHENSIVE METABOLIC PANEL
ALT: 25 U/L (ref 0–44)
AST: 33 U/L (ref 15–41)
Albumin: 3.2 g/dL — ABNORMAL LOW (ref 3.5–5.0)
Alkaline Phosphatase: 70 U/L (ref 38–126)
Anion gap: 9 (ref 5–15)
BUN: 13 mg/dL (ref 8–23)
CO2: 28 mmol/L (ref 22–32)
Calcium: 8.7 mg/dL — ABNORMAL LOW (ref 8.9–10.3)
Chloride: 103 mmol/L (ref 98–111)
Creatinine, Ser: 0.88 mg/dL (ref 0.44–1.00)
GFR, Estimated: >60 mL/min (ref >60)
Glucose, Bld: 145 mg/dL — ABNORMAL HIGH (ref 70–99)
Potassium: 4.7 mmol/L (ref 3.5–5.1)
Sodium: 140 mmol/L (ref 135–145)
Total Bilirubin: 0.8 mg/dL (ref 0.3–1.2)
Total Protein: 6.1 g/dL — ABNORMAL LOW (ref 6.5–8.1)

## 2021-02-07 LAB — CBC
HCT: 40.5 % (ref 36.0–46.0)
Hemoglobin: 12.4 g/dL (ref 12.0–15.0)
MCH: 28.7 pg (ref 26.0–34.0)
MCHC: 30.6 g/dL (ref 30.0–36.0)
MCV: 93.8 fL (ref 80.0–100.0)
Platelets: 152 10*3/uL (ref 150–400)
RBC: 4.32 MIL/uL (ref 3.87–5.11)
RDW: 13.8 % (ref 11.5–15.5)
WBC: 7.1 10*3/uL (ref 4.0–10.5)
nRBC: 0 % (ref 0.0–0.2)

## 2021-02-07 LAB — ABO/RH: ABO/RH(D): B POS

## 2021-02-07 SURGERY — OPEN REDUCTION INTERNAL FIXATION (ORIF) DISTAL FEMUR FRACTURE
Anesthesia: Spinal | Site: Leg Upper | Laterality: Right

## 2021-02-07 MED ORDER — FLUOXETINE HCL 40 MG PO CAPS: 40.0000 mg | ORAL_CAPSULE | Freq: Every day | ORAL | Status: DC

## 2021-02-07 MED ORDER — PHENYLEPHRINE HCL-NACL 20-0.9 MG/250ML-% IV SOLN
INTRAVENOUS | Status: DC | PRN
  Administered 2021-02-07: 50 ug/min via INTRAVENOUS

## 2021-02-07 MED ORDER — PROPOFOL 500 MG/50ML IV EMUL
INTRAVENOUS | Status: DC | PRN
  Administered 2021-02-07: 50 ug/kg/min via INTRAVENOUS

## 2021-02-07 MED ORDER — ENSURE ENLIVE PO LIQD
237.0000 mL | Freq: Two times a day (BID) | ORAL | Status: DC
  Administered 2021-02-08 – 2021-02-10 (×5): 237 mL via ORAL

## 2021-02-07 MED ORDER — POLYETHYLENE GLYCOL 3350 17 G PO PACK
17.0000 g | PACK | Freq: Every day | ORAL | Status: DC | PRN
  Administered 2021-02-09: 17 g via ORAL
  Filled 2021-02-07: qty 1

## 2021-02-07 MED ORDER — MAGNESIUM SULFATE 2 GM/50ML IV SOLN
2.0000 g | Freq: Once | INTRAVENOUS | Status: AC
  Administered 2021-02-07: 2 g via INTRAVENOUS
  Filled 2021-02-07: qty 50

## 2021-02-07 MED ORDER — TRANEXAMIC ACID-NACL 1000-0.7 MG/100ML-% IV SOLN: 1000.0000 mg | INTRAVENOUS | Status: DC

## 2021-02-07 MED ORDER — ACETAMINOPHEN 10 MG/ML IV SOLN: 1000.0000 mg | Freq: Once | INTRAVENOUS | Status: DC | PRN

## 2021-02-07 MED ORDER — MEMANTINE HCL 10 MG PO TABS: 10.0000 mg | ORAL_TABLET | Freq: Two times a day (BID) | ORAL | Status: DC

## 2021-02-07 MED ORDER — METHOCARBAMOL 500 MG PO TABS: 500.0000 mg | ORAL_TABLET | Freq: Four times a day (QID) | ORAL | Status: DC | PRN

## 2021-02-07 MED ORDER — VITAMIN D3 25 MCG (1000 UNIT) PO TABS
125.0000 ug | ORAL_TABLET | Freq: Every day | ORAL | Status: DC
  Administered 2021-02-07 – 2021-02-11 (×5): 5000 [IU] via ORAL
  Filled 2021-02-07 (×5): qty 5

## 2021-02-07 MED ORDER — MEMANTINE HCL 10 MG PO TABS
10.0000 mg | ORAL_TABLET | Freq: Two times a day (BID) | ORAL | Status: DC
  Administered 2021-02-07 – 2021-02-11 (×9): 10 mg via ORAL
  Filled 2021-02-07 (×9): qty 1

## 2021-02-07 MED ORDER — BISACODYL 10 MG RE SUPP: 10.0000 mg | Freq: Every day | RECTAL | Status: DC | PRN

## 2021-02-07 MED ORDER — ONDANSETRON HCL 4 MG/2ML IJ SOLN
INTRAMUSCULAR | Status: DC | PRN
  Administered 2021-02-07: 4 mg via INTRAVENOUS

## 2021-02-07 MED ORDER — ACETAMINOPHEN 500 MG PO TABS
500.0000 mg | ORAL_TABLET | Freq: Four times a day (QID) | ORAL | Status: AC
  Administered 2021-02-07 – 2021-02-08 (×4): 500 mg via ORAL
  Filled 2021-02-07 (×4): qty 1

## 2021-02-07 MED ORDER — CEFAZOLIN SODIUM-DEXTROSE 2-4 GM/100ML-% IV SOLN
2.0000 g | INTRAVENOUS | Status: AC
  Administered 2021-02-07: 2 g via INTRAVENOUS

## 2021-02-07 MED ORDER — ROCURONIUM BROMIDE 10 MG/ML (PF) SYRINGE
PREFILLED_SYRINGE | INTRAVENOUS | Status: AC
  Filled 2021-02-07: qty 10

## 2021-02-07 MED ORDER — DEXAMETHASONE SODIUM PHOSPHATE 10 MG/ML IJ SOLN
INTRAMUSCULAR | Status: DC | PRN
  Administered 2021-02-07: 4 mg via INTRAVENOUS

## 2021-02-07 MED ORDER — STERILE WATER FOR IRRIGATION IR SOLN
Status: DC | PRN
  Administered 2021-02-07: 1000 mL

## 2021-02-07 MED ORDER — LORATADINE 10 MG PO TABS
10.0000 mg | ORAL_TABLET | Freq: Every morning | ORAL | Status: DC
  Administered 2021-02-07 – 2021-02-11 (×5): 10 mg via ORAL
  Filled 2021-02-07 (×5): qty 1

## 2021-02-07 MED ORDER — FENTANYL CITRATE PF 50 MCG/ML IJ SOSY: 25.0000 ug | PREFILLED_SYRINGE | INTRAMUSCULAR | Status: DC | PRN

## 2021-02-07 MED ORDER — PANTOPRAZOLE SODIUM 40 MG PO TBEC
40.0000 mg | DELAYED_RELEASE_TABLET | Freq: Every day | ORAL | Status: DC
  Administered 2021-02-07 – 2021-02-10 (×4): 40 mg via ORAL
  Filled 2021-02-07 (×5): qty 1

## 2021-02-07 MED ORDER — AMISULPRIDE (ANTIEMETIC) 5 MG/2ML IV SOLN: 10.0000 mg | Freq: Once | INTRAVENOUS | Status: DC | PRN

## 2021-02-07 MED ORDER — PHENYLEPHRINE 40 MCG/ML (10ML) SYRINGE FOR IV PUSH (FOR BLOOD PRESSURE SUPPORT)
PREFILLED_SYRINGE | INTRAVENOUS | Status: DC | PRN
  Administered 2021-02-07 (×2): 120 ug via INTRAVENOUS
  Administered 2021-02-07: 160 ug via INTRAVENOUS

## 2021-02-07 MED ORDER — POTASSIUM CHLORIDE CRYS ER 10 MEQ PO TBCR
10.0000 meq | EXTENDED_RELEASE_TABLET | Freq: Every morning | ORAL | Status: DC
  Administered 2021-02-07 – 2021-02-10 (×4): 10 meq via ORAL
  Filled 2021-02-07 (×4): qty 1

## 2021-02-07 MED ORDER — ACETAMINOPHEN 325 MG PO TABS
325.0000 mg | ORAL_TABLET | Freq: Four times a day (QID) | ORAL | Status: DC | PRN
  Administered 2021-02-09: 650 mg via ORAL
  Filled 2021-02-07: qty 2

## 2021-02-07 MED ORDER — CEFAZOLIN SODIUM-DEXTROSE 2-4 GM/100ML-% IV SOLN
2.0000 g | Freq: Four times a day (QID) | INTRAVENOUS | Status: AC
Start: 2021-02-07 — End: 2021-02-07
  Administered 2021-02-07: 2 g via INTRAVENOUS
  Filled 2021-02-07 (×3): qty 100

## 2021-02-07 MED ORDER — PHENYLEPHRINE 40 MCG/ML (10ML) SYRINGE FOR IV PUSH (FOR BLOOD PRESSURE SUPPORT)
PREFILLED_SYRINGE | INTRAVENOUS | Status: AC
  Filled 2021-02-07: qty 10

## 2021-02-07 MED ORDER — PYRIDOSTIGMINE BROMIDE 60 MG PO TABS
60.0000 mg | ORAL_TABLET | Freq: Three times a day (TID) | ORAL | Status: DC
  Administered 2021-02-07 – 2021-02-11 (×12): 60 mg via ORAL
  Filled 2021-02-07 (×14): qty 1

## 2021-02-07 MED ORDER — CEFAZOLIN SODIUM-DEXTROSE 2-4 GM/100ML-% IV SOLN
INTRAVENOUS | Status: AC
  Administered 2021-02-07: 2 g via INTRAVENOUS
  Filled 2021-02-07: qty 100

## 2021-02-07 MED ORDER — PROCHLORPERAZINE EDISYLATE 10 MG/2ML IJ SOLN: 5.0000 mg | INTRAMUSCULAR | Status: DC | PRN

## 2021-02-07 MED ORDER — METOCLOPRAMIDE HCL 5 MG/ML IJ SOLN: 5.0000 mg | Freq: Three times a day (TID) | INTRAMUSCULAR | Status: DC | PRN

## 2021-02-07 MED ORDER — TRANEXAMIC ACID-NACL 1000-0.7 MG/100ML-% IV SOLN
INTRAVENOUS | Status: AC
  Administered 2021-02-07: 1000 mg via INTRAVENOUS
  Filled 2021-02-07: qty 100

## 2021-02-07 MED ORDER — ALUM & MAG HYDROXIDE-SIMETH 200-200-20 MG/5ML PO SUSP: 30.0000 mL | ORAL | Status: DC | PRN

## 2021-02-07 MED ORDER — DEXAMETHASONE SODIUM PHOSPHATE 10 MG/ML IJ SOLN: 8.0000 mg | Freq: Once | INTRAMUSCULAR | Status: DC

## 2021-02-07 MED ORDER — LIDOCAINE HCL (CARDIAC) PF 100 MG/5ML IV SOSY
PREFILLED_SYRINGE | INTRAVENOUS | Status: DC | PRN
  Administered 2021-02-07: 40 mg via INTRAVENOUS

## 2021-02-07 MED ORDER — PYRIDOSTIGMINE BROMIDE 60 MG PO TABS: 60.0000 mg | ORAL_TABLET | Freq: Three times a day (TID) | ORAL | Status: DC

## 2021-02-07 MED ORDER — HYDROCODONE-ACETAMINOPHEN 7.5-325 MG PO TABS: 1.0000 | ORAL_TABLET | ORAL | Status: DC | PRN

## 2021-02-07 MED ORDER — 0.9 % SODIUM CHLORIDE (POUR BTL) OPTIME
TOPICAL | Status: DC | PRN
  Administered 2021-02-07: 1000 mL

## 2021-02-07 MED ORDER — TRANEXAMIC ACID-NACL 1000-0.7 MG/100ML-% IV SOLN
INTRAVENOUS | Status: DC | PRN
  Administered 2021-02-07: 1000 mg via INTRAVENOUS

## 2021-02-07 MED ORDER — FLUDROCORTISONE ACETATE 0.1 MG PO TABS
0.1000 mg | ORAL_TABLET | Freq: Every day | ORAL | Status: DC
  Administered 2021-02-07 – 2021-02-11 (×5): 0.1 mg via ORAL
  Filled 2021-02-07 (×5): qty 1

## 2021-02-07 MED ORDER — HYDROCODONE-ACETAMINOPHEN 5-325 MG PO TABS
1.0000 | ORAL_TABLET | ORAL | Status: DC | PRN
  Administered 2021-02-08 (×2): 2 via ORAL
  Administered 2021-02-09: 1 via ORAL
  Administered 2021-02-09: 2 via ORAL
  Administered 2021-02-09 – 2021-02-10 (×2): 1 via ORAL
  Filled 2021-02-07 (×2): qty 2
  Filled 2021-02-07 (×3): qty 1
  Filled 2021-02-07: qty 2

## 2021-02-07 MED ORDER — PROPOFOL 10 MG/ML IV BOLUS
INTRAVENOUS | Status: DC | PRN
  Administered 2021-02-07 (×2): 20 mg via INTRAVENOUS

## 2021-02-07 MED ORDER — PHENOL 1.4 % MT LIQD
1.0000 | OROMUCOSAL | Status: DC | PRN
  Filled 2021-02-07: qty 177

## 2021-02-07 MED ORDER — FENTANYL CITRATE (PF) 100 MCG/2ML IJ SOLN
INTRAMUSCULAR | Status: AC
  Filled 2021-02-07: qty 2

## 2021-02-07 MED ORDER — MIDODRINE HCL 5 MG PO TABS: 5.0000 mg | ORAL_TABLET | Freq: Two times a day (BID) | ORAL | Status: DC

## 2021-02-07 MED ORDER — POVIDONE-IODINE 10 % EX SWAB: 2.0000 "application " | Freq: Once | CUTANEOUS | Status: DC

## 2021-02-07 MED ORDER — ACETAMINOPHEN 500 MG PO TABS: 1000.0000 mg | ORAL_TABLET | Freq: Once | ORAL | Status: DC

## 2021-02-07 MED ORDER — FENTANYL CITRATE (PF) 100 MCG/2ML IJ SOLN
INTRAMUSCULAR | Status: DC | PRN
  Administered 2021-02-07: 50 ug via INTRAVENOUS

## 2021-02-07 MED ORDER — ARIPIPRAZOLE 2 MG PO TABS: 2.0000 mg | ORAL_TABLET | Freq: Every day | ORAL | Status: DC

## 2021-02-07 MED ORDER — BUPIVACAINE IN DEXTROSE 0.75-8.25 % IT SOLN
INTRATHECAL | Status: DC | PRN
  Administered 2021-02-07: 1.8 mL via INTRATHECAL

## 2021-02-07 MED ORDER — LACTATED RINGERS IV SOLN: INTRAVENOUS | Status: DC | PRN

## 2021-02-07 MED ORDER — PROPOFOL 10 MG/ML IV BOLUS
INTRAVENOUS | Status: AC
  Filled 2021-02-07: qty 20

## 2021-02-07 MED ORDER — DOCUSATE SODIUM 100 MG PO CAPS
100.0000 mg | ORAL_CAPSULE | Freq: Two times a day (BID) | ORAL | Status: DC
  Administered 2021-02-07 – 2021-02-11 (×9): 100 mg via ORAL
  Filled 2021-02-07 (×9): qty 1

## 2021-02-07 MED ORDER — FLUOXETINE HCL 20 MG PO CAPS
40.0000 mg | ORAL_CAPSULE | Freq: Every day | ORAL | Status: DC
  Administered 2021-02-07 – 2021-02-11 (×5): 40 mg via ORAL
  Filled 2021-02-07 (×5): qty 2

## 2021-02-07 MED ORDER — FLUDROCORTISONE ACETATE 0.1 MG PO TABS: 0.1000 mg | ORAL_TABLET | Freq: Every day | ORAL | Status: DC

## 2021-02-07 MED ORDER — ENOXAPARIN SODIUM 40 MG/0.4ML IJ SOSY
40.0000 mg | PREFILLED_SYRINGE | INTRAMUSCULAR | Status: DC
  Administered 2021-02-08 – 2021-02-11 (×4): 40 mg via SUBCUTANEOUS
  Filled 2021-02-07 (×5): qty 0.4

## 2021-02-07 MED ORDER — FUROSEMIDE 20 MG PO TABS
20.0000 mg | ORAL_TABLET | ORAL | Status: DC
  Administered 2021-02-07: 20 mg via ORAL
  Filled 2021-02-07: qty 1

## 2021-02-07 MED ORDER — TRANEXAMIC ACID-NACL 1000-0.7 MG/100ML-% IV SOLN
1000.0000 mg | Freq: Once | INTRAVENOUS | Status: AC
  Filled 2021-02-07: qty 100

## 2021-02-07 MED ORDER — ADULT MULTIVITAMIN W/MINERALS CH
1.0000 | ORAL_TABLET | Freq: Every day | ORAL | Status: DC
  Administered 2021-02-08 – 2021-02-11 (×4): 1 via ORAL
  Filled 2021-02-07 (×4): qty 1

## 2021-02-07 MED ORDER — METOCLOPRAMIDE HCL 5 MG PO TABS
5.0000 mg | ORAL_TABLET | Freq: Three times a day (TID) | ORAL | Status: DC | PRN
Start: 2021-02-07 — End: 2021-02-08

## 2021-02-07 MED ORDER — METHOCARBAMOL 1000 MG/10ML IJ SOLN
500.0000 mg | Freq: Four times a day (QID) | INTRAVENOUS | Status: DC | PRN
  Filled 2021-02-07: qty 5

## 2021-02-07 MED ORDER — MENTHOL 3 MG MT LOZG: 1.0000 | LOZENGE | OROMUCOSAL | Status: DC | PRN

## 2021-02-07 SURGICAL SUPPLY — 71 items
BAG COUNTER SPONGE SURGICOUNT (BAG) ×2 IMPLANT
BAG SPNG CNTER NS LX DISP (BAG) ×1
BIT DRILL CNTRSNK 0.8X100 AO (DRILL) IMPLANT
BIT DRILL LOCK 4.3 (BIT) ×1 IMPLANT
BIT DRILL NLOCK SHRT 3.2X216 (BIT) ×1 IMPLANT
BIT DRILL TWST MATTA 4.5MX6.5M (BIT) IMPLANT
BLADE CLIPPER SURG (BLADE) IMPLANT
BNDG ELASTIC 4X5.8 VLCR STR LF (GAUZE/BANDAGES/DRESSINGS) ×2 IMPLANT
BNDG ELASTIC 6X5.8 VLCR STR LF (GAUZE/BANDAGES/DRESSINGS) ×2 IMPLANT
BNDG GAUZE ELAST 4 BULKY (GAUZE/BANDAGES/DRESSINGS) ×2 IMPLANT
CLSR STERI-STRIP ANTIMIC 1/2X4 (GAUZE/BANDAGES/DRESSINGS) ×1 IMPLANT
DRAPE C-ARM 42X120 X-RAY (DRAPES) ×2 IMPLANT
DRAPE C-ARMOR (DRAPES) ×2 IMPLANT
DRAPE IMP U-DRAPE 54X76 (DRAPES) ×2 IMPLANT
DRAPE ORTHO 2.5IN SPLIT 77X108 (DRAPES) IMPLANT
DRAPE ORTHO SPLIT 77X108 STRL (DRAPES) ×8
DRAPE SURG ORHT 6 SPLT 77X108 (DRAPES) ×2 IMPLANT
DRAPE U-SHAPE 47X51 STRL (DRAPES) ×2 IMPLANT
DRILL COUNTERSINK 0.8X100 AO (DRILL) ×2
DRILL TWIST MATTA 4.5MX6.5M (BIT) ×2
DRSG ADAPTIC 3X8 NADH LF (GAUZE/BANDAGES/DRESSINGS) ×2 IMPLANT
DRSG AQUACEL AG ADV 3.5X10 (GAUZE/BANDAGES/DRESSINGS) ×1 IMPLANT
DRSG AQUACEL AG ADV 3.5X14 (GAUZE/BANDAGES/DRESSINGS) IMPLANT
DRSG MEPILEX BORDER 4X12 (GAUZE/BANDAGES/DRESSINGS) ×2 IMPLANT
DRSG PAD ABDOMINAL 8X10 ST (GAUZE/BANDAGES/DRESSINGS) ×8 IMPLANT
ELECT BLADE TIP CTD 4 INCH (ELECTRODE) ×1 IMPLANT
ELECT REM PT RETURN 15FT ADLT (MISCELLANEOUS) ×2 IMPLANT
GAUZE SPONGE 4X4 12PLY STRL (GAUZE/BANDAGES/DRESSINGS) ×2 IMPLANT
GLOVE SRG 8 PF TXTR STRL LF DI (GLOVE) ×1 IMPLANT
GLOVE SURG ENC MOIS LTX SZ7.5 (GLOVE) ×2 IMPLANT
GLOVE SURG POLYISO LF SZ7.5 (GLOVE) ×2 IMPLANT
GLOVE SURG UNDER POLY LF SZ7.5 (GLOVE) ×2 IMPLANT
GLOVE SURG UNDER POLY LF SZ8 (GLOVE) ×2
GOWN STRL REUS W/TWL LRG LVL3 (GOWN DISPOSABLE) ×2 IMPLANT
GOWN STRL REUS W/TWL XL LVL3 (GOWN DISPOSABLE) ×4 IMPLANT
IMMOBILIZER KNEE 20 (SOFTGOODS) ×2 IMPLANT
IMMOBILIZER KNEE 20 THIGH 36 (SOFTGOODS) IMPLANT
K-WIRE SMOOTH 2.0X150 (WIRE) ×6
KIT BASIN OR (CUSTOM PROCEDURE TRAY) ×2 IMPLANT
KIT TURNOVER KIT A (KITS) ×2 IMPLANT
KWIRE SMOOTH 2.0X150 (WIRE) IMPLANT
MANIFOLD NEPTUNE II (INSTRUMENTS) ×2 IMPLANT
NS IRRIG 1000ML POUR BTL (IV SOLUTION) ×2 IMPLANT
PACK TOTAL JOINT (CUSTOM PROCEDURE TRAY) ×2 IMPLANT
PACK UNIVERSAL I (CUSTOM PROCEDURE TRAY) ×2 IMPLANT
PAD ARMBOARD 7.5X6 YLW CONV (MISCELLANEOUS) ×4 IMPLANT
PAD CAST 4YDX4 CTTN HI CHSV (CAST SUPPLIES) ×1 IMPLANT
PADDING CAST COTTON 4X4 STRL (CAST SUPPLIES) ×2
PADDING CAST COTTON 6X4 STRL (CAST SUPPLIES) ×2 IMPLANT
PLATE FEM DIST 202 8H RT (Plate) ×1 IMPLANT
SCREW CANC AXSOS 6X65 (Screw) ×1 IMPLANT
SCREW CORT 38X4.5XST LCK NS (Screw) IMPLANT
SCREW CORTICAL 4.5X36MM (Screw) ×2 IMPLANT
SCREW CORTICAL 4.5X38MM (Screw) ×4 IMPLANT
SCREW CORTICAL 4.5X42MM (Screw) ×1 IMPLANT
SCREW LOCK 70X5XSTNS TI (Screw) IMPLANT
SCREW LOCKING 40X5.0MM (Screw) ×1 IMPLANT
SCREW LOCKING 5.0X70MM (Screw) ×4 IMPLANT
SCREW LOCKING 5.0X75MM (Screw) ×2 IMPLANT
STAPLER VISISTAT 35W (STAPLE) IMPLANT
SUT MNCRL AB 4-0 PS2 18 (SUTURE) IMPLANT
SUT VIC AB 0 CT1 27 (SUTURE) ×4
SUT VIC AB 0 CT1 27XBRD ANBCTR (SUTURE) ×2 IMPLANT
SUT VIC AB 2-0 CT1 27 (SUTURE) ×4
SUT VIC AB 2-0 CT1 TAPERPNT 27 (SUTURE) ×2 IMPLANT
SUT VIC AB 2-0 SH 27 (SUTURE)
SUT VIC AB 2-0 SH 27XBRD (SUTURE) IMPLANT
TOWEL OR 17X26 10 PK STRL BLUE (TOWEL DISPOSABLE) ×4 IMPLANT
TOWEL OR NON WOVEN STRL DISP B (DISPOSABLE) ×2 IMPLANT
TRAY FOLEY MTR SLVR 16FR STAT (SET/KITS/TRAYS/PACK) IMPLANT
WATER STERILE IRR 1000ML POUR (IV SOLUTION) ×4 IMPLANT

## 2021-02-07 NOTE — Anesthesia Postprocedure Evaluation (Signed)
Anesthesia Post Note  Patient: Sabrina Page  Procedure(s) Performed: OPEN REDUCTION INTERNAL FIXATION (ORIF) DISTAL FEMUR FRACTURE (Right: Leg Upper)     Patient location during evaluation: PACU Anesthesia Type: Spinal Level of consciousness: awake Pain management: pain level controlled Vital Signs Assessment: post-procedure vital signs reviewed and stable Respiratory status: spontaneous breathing, respiratory function stable and patient connected to nasal cannula oxygen Cardiovascular status: blood pressure returned to baseline and stable Postop Assessment: no headache, no backache and no apparent nausea or vomiting Anesthetic complications: no   No notable events documented.  Last Vitals:  Vitals:   02/07/21 1226 02/07/21 1308  BP:  (!) 130/56  Pulse: 78 79  Resp: 18 20  Temp:  36.9 C  SpO2: 100% 100%    Last Pain:  Vitals:   02/07/21 1308  TempSrc: Oral  PainSc:                  Jayen Bromwell P Carla Whilden

## 2021-02-07 NOTE — Consult Note (Addendum)
ORTHOPAEDIC CONSULTATION  REQUESTING PHYSICIAN: Domenic Polite, MD  Chief Complaint: right leg pain  HPI: Sabrina Page is a 87 y.o. female who complains of right leg pain after falling out of a lift chair at her assisting living facility. Her right leg was stuck under the chair. She had immediate pain and inability to bear weight on the right leg. She does not usually ambulate on her own but is normally able to stand. She speaks [place] so the majority of this information is from other providers and the patient's daughter.   Imaging shows acute fracture of the distal right femoral shaft. Marked severity degenerative changes. Small joint effusion.   Orthopedics was consulted for evaluation.    No history of MI, CVA, DVT, PE.  Previously non ambulatory.  The patient is living at an assisted living facility.    Past Medical History:  Diagnosis Date   Anxiety with depression 02/06/2021   Chronic diastolic heart failure ([place]) 02/06/2021   COPD (chronic obstructive pulmonary disease) ([place]) 02/06/2021   COVID-19 virus infection 02/06/2021   Dementia due to Alzheimer's disease ([place]) 02/06/2021   GERD (gastroesophageal reflux disease) 02/06/2021   Orthostatic hypotension 02/06/2021   Past Surgical History:  Procedure Laterality Date   BREAST SURGERY     due to mastitis   CHOLECYSTECTOMY     HIP ARTHROPLASTY Right    TOTAL KNEE ARTHROPLASTY Left    Social History   Socioeconomic History   Marital status: Married    Spouse name: Not on file   Number of children: Not on file   Years of education: Not on file   Highest education level: Not on file  Occupational History   Not on file  Tobacco Use   Smoking status: Former    Types: Cigarettes   Smokeless tobacco: Never  Substance and Sexual Activity   Alcohol use: Yes    Comment: Occasionally.   Drug use: Never   Sexual activity: Not on file  Other Topics Concern   Not on file  Social History Narrative   Not on  file   Social Determinants of Health   Financial Resource Strain: Not on file  Food Insecurity: Not on file  Transportation Needs: Not on file  Physical Activity: Not on file  Stress: Not on file  Social Connections: Not on file   Family History  Problem Relation Age of Onset   Hypertension Mother    Stroke Mother    Stroke Father    Hypertension Father    No Known Allergies Prior to Admission medications   Medication Sig Start Date End Date Taking? Authorizing Provider  ARIPiprazole (ABILIFY) 2 MG tablet Take 2 mg by mouth daily.   Yes [provider]  ASPIRIN 81 PO Take 1 tablet by mouth 2 (two) times daily. 01/06/21  Yes [provider]  Cholecalciferol (VITAMIN D) 125 MCG (5000 UT) CAPS Take 1 capsule by mouth daily.   Yes [provider]  docusate sodium (COLACE) 100 MG capsule Take 100 mg by mouth 2 (two) times daily. 01/06/21  Yes [provider]  fludrocortisone (FLORINEF) 0.1 MG tablet Take 0.1 mg by mouth daily. PLEASE SEE ATTACHED FOR DETAILED DIRECTIONS 12/29/20  Yes [provider]  FLUoxetine (PROZAC) 40 MG capsule Take 40 mg by mouth daily. 12/13/20  Yes [provider]  furosemide (LASIX) 20 MG tablet Take 20 mg by mouth every other day. 01/17/21  Yes [provider]  loratadine (CLARITIN) 10 MG  tablet Take 10 mg by mouth every morning. 11/04/20  Yes [provider]  memantine (NAMENDA) 10 MG tablet Take 10 mg by mouth 2 (two) times daily. 12/29/20  Yes [provider]  midodrine (PROAMATINE) 10 MG tablet Take 5 mg by mouth 2 (two) times daily. 12/16/20  Yes [provider]  omeprazole (PRILOSEC) 20 MG capsule Take 40 mg by mouth daily. 12/26/20  Yes [provider]  potassium chloride (KLOR-CON) 8 MEQ tablet Take 8 mEq by mouth every morning. 01/04/21  Yes [provider]  pyridostigmine (MESTINON) 60 MG tablet Take 60 mg by mouth 3 (three) times daily. 01/04/21  Yes  [provider]  nystatin cream (MYCOSTATIN) Apply 1 application topically daily as needed. 01/06/21   [provider]   Chest Portable 1 View  Result Date: 02/06/2021 CLINICAL DATA:  Preop knee surgery EXAM: PORTABLE CHEST 1 VIEW COMPARISON:  01/13/2021 FINDINGS: The heart size and mediastinal contours are within normal limits. Both lungs are clear. The visualized skeletal structures are unremarkable. Low lung volumes. IMPRESSION: No active disease. Electronically Signed   By: Donavan Foil M.D.   On: 02/06/2021 23:20   DG Knee Complete 4 Views Right  Result Date: 02/06/2021 CLINICAL DATA:  Status post fall. EXAM: RIGHT KNEE - COMPLETE 4+ VIEW COMPARISON:  None. FINDINGS: Acute fracture deformity is seen involving the distal right femoral shaft. Medial angulation of the distal fracture site is seen. There is no evidence of dislocation. There is marked severity medial and lateral tibiofemoral compartment space narrowing. A small joint effusion is seen. IMPRESSION: 1. Acute fracture of the distal right femoral shaft. 2. Marked severity degenerative changes. 3. Small joint effusion. Electronically Signed   By: Virgina Norfolk M.D.   On: 02/06/2021 21:30   DG Hip Unilat W or Wo Pelvis 2-3 Views Right  Result Date: 02/06/2021 CLINICAL DATA:  Fall, injury. EXAM: DG HIP (WITH OR WITHOUT PELVIS) 2-3V RIGHT COMPARISON:  None. FINDINGS: The bones are diffusely osteopenic. This limits evaluation for subtle nondisplaced fracture. A right hip arthroplasty appears in anatomic alignment. There is no acute fracture or hardware loosening identified. Soft tissues are within normal limits. Degenerative changes affect the lumbar spine. IMPRESSION: 1. Examination is limited by osteopenia. No definite acute fracture or dislocation. 2. Right hip arthroplasty in anatomic alignment. Electronically Signed   By: Ronney Asters M.D.   On: 02/06/2021 21:32    Positive ROS: All other systems have been reviewed  and were otherwise negative with the exception of those mentioned in the HPI and as above.  Objective: Labs cbc Recent Labs    02/06/21 2210 02/07/21 0430  WBC 8.6 7.1  HGB 13.7 12.4  HCT 44.5 40.5  PLT 145* 152    Labs inflam No results for input(s): CRP in the last 72 hours.  Invalid input(s): ESR  Labs coag No results for input(s): INR, PTT in the last 72 hours.  Invalid input(s): PT  Recent Labs    02/06/21 2210 02/07/21 0430  NA 144 140  K 4.3 4.7  CL 107 103  CO2 31 28  GLUCOSE 120* 145*  BUN 13 13  CREATININE 0.88 0.88  CALCIUM 9.2 8.7*    Physical Exam: Vitals:   02/07/21 0545 02/07/21 0546  BP:    Pulse: 86 84  Resp: 17 14  Temp:    SpO2: (!) 89% 94%   General: Alert, no acute distress.  Laying in Biomedical scientist, calm. Mental status: Alert and Oriented x3  Neurologic: Speech Clear and organized, no gross focal findings or movement disorder appreciated. Respiratory: No cyanosis, no use of accessory musculature Cardiovascular: No pedal edema GI: Abdomen is soft and non-tender, non-distended. Skin: Warm and dry.  No lesions in the area of chief complaint. Extremities: Warm and well perfused w/o edema. Well healed L knee and R hip surgical incisions. Psychiatric: Patient is competent for consent with normal mood and affect  MUSCULOSKELETAL:  RLE shortened and internally rotated. R knee edema and ecchymosis. R knee TTP. R hip and R knee ROM limited d/t pain. Calf soft and compressible. NVI. Other extremities are atraumatic with painless ROM and NVI.  Assessment / Plan: Principal Problem:   Closed fracture of right distal femur (HCC) Active Problems:   Dementia due to Alzheimer's disease (HCC)   Orthostatic hypotension   COPD (chronic obstructive pulmonary disease) (HCC)   Chronic diastolic heart failure (HCC)   GERD (gastroesophageal reflux disease)   Anxiety with depression   Prolonged QT interval   Will plan to take the patient to the OR this  morning for surgical fixation of her right distal femur fracture. Keep her NPO.   Weightbearing: PWB RLE Insicional and dressing care: Dressings left intact until follow-up and Reinforce dressings as needed Orthopedic device(s):  KI Showering: POD 3, keep incisions dry VTE prophylaxis: Lovenox 69m qd  while inpatient, can switch to ASA 826mbid x 30 days once d/c Pain control: Limit narcotics due to dementia  Follow - up plan: 2 weeks post-op in the office Contact information:  TiEdmonia LynchD, [facility] Hospital[facility]-C   MeBritt BottomA-C Office [phone]/17/2022 8:08 AM

## 2021-02-07 NOTE — Op Note (Signed)
02/06/2021 - 02/07/2021  9:29 AM  PATIENT:  Sabrina Page    PRE-OPERATIVE DIAGNOSIS:  right femur fracture  POST-OPERATIVE DIAGNOSIS:  Same  PROCEDURE:  OPEN REDUCTION INTERNAL FIXATION (ORIF) DISTAL FEMUR FRACTURE  SURGEON:  Renette Butters, MD  ASSISTANT: Aggie Moats, PA-C, he was present and scrubbed throughout the case, critical for completion in a timely fashion, and for retraction, instrumentation, and closure.   ANESTHESIA:   spinal  PREOPERATIVE INDICATIONS:  [name] is a  87 y.o. female with a diagnosis of right femur fracture who failed conservative measures and elected for surgical management.    The risks benefits and alternatives were discussed with the patient preoperatively including but not limited to the risks of infection, bleeding, nerve injury, cardiopulmonary complications, the need for revision surgery, among others, and the patient was willing to proceed.  OPERATIVE IMPLANTS: Stryker distal femur locking plate  OPERATIVE FINDINGS: supracondylar fracture  BLOOD LOSS: [id number]  COMPLICATIONS: none  TOURNIQUET TIME: none  OPERATIVE PROCEDURE:  Patient was identified in the preoperative holding area and site was marked by me She was transported to the operating theater and placed on the table in supine position taking care to pad all bony prominences. After a preincinduction time out anesthesia was induced. The right lower extremity was prepped and draped in normal sterile fashion and a pre-incision timeout was performed. She received ancef for preoperative antibiotics.   I made a lateral leg incision from the joint line superiorly of roughly 10 cm. I carried my dissection down sharply to the IT band which was split in line with the incision. I then used a Cobb to elevate the muscle off of the lateral femur and the fracture was identified immediately.  I then debrided the fracture site and a performed a reduction maneuver I selected the  appropriate length locking plate for the distal femur and pinned this into place with a K wire holding the fracture reduced as well. I placed a lag screw across the fracture  I then placed a combination of locking and nonlocking screws to help with fracture reduction and stabilization in the proximal aspect of the plate as well as the distal aspect of the plate. I uses many distal locking screws as possible. Care was taken to not penetrate the distal end of the bone.   Had good bites of my locking screws proximal bicortical screws had good bites the bone was soft distally.  I then took multiple fluoroscopic views and was happy with the reduction and placement of all hardware. I then thoroughly irrigated the wound and closed the IT band followed by the skin in layers with absorbable stitch.  POST OPERATIVE PLAN: Knee immobilizer full-time, partial WB. dvt px: scd's/TED's, and mobilization and chemical px as indicated

## 2021-02-07 NOTE — Progress Notes (Addendum)
PROGRESS NOTE    Sabrina Page  [id number] DOB: 06-Nov-1933 DOA: 02/06/2021 PCP: Cassandria Anger, MD  Brief Narrative:Sabrina Page is a 87 y.o. female with medical history significant of anxiety, chronic diastolic heart failure, COPD, Alzheimer's dementia, GERD, orthostatic hypotension who was admitted and discharged last month due to COVID-19 and was brought to the ED from her ALF following a mechanical fall, history was obtained from patient's daughter reported that she is able to stand but does not ambulate at baseline. -Denies any history of dizziness, loss of consciousness -Work-up in the ED noted right distal femur shaft fracture with severe DJD. -Orthopedics was consulted and patient was admitted to [facility]:   Closed fracture of right distal femur ([place]) -Orthopedics consulting, plan for surgery today -Will need DVT prophylaxis with Lovenox. -PT OT consult -May need short-term rehab   Prolonged QT interval -Given IV mag earlier today, monitor on telemetry -Repeat EKG later     Dementia due to Alzheimer's disease (HCC) Continue memantine 10 mg p.o. twice daily.     Anxiety with depression Continue Abilify 2 mg p.o. daily.     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     Chronic diastolic heart failure (HCC) -Clinically euvolemic, monitor     GERD (gastroesophageal reflux disease) Continue PPI.     DVT prophylaxis:      SCDs. Code Status:                         DNR. Family Communication:   No family at bedside this morning, will update daughter Disposition Plan:  Status is: Inpatient  Remains inpatient appropriate because:Inpatient level of care appropriate due to severity of illness  Dispo: The patient is from: ALF               Anticipated d/c is to: SNF              Patient currently is not medically stable to d/c.   Difficult to place patient No        Consultants:  Orthopedics Dr. Percell Miller  Procedures:   Antimicrobials:    Subjective: -Feels okay overall, got a pain medicine dose earlier this morning, sitting up in bed comfortably  Objective: Vitals:   02/07/21 0500 02/07/21 0530 02/07/21 0545 02/07/21 0546  BP: 127/67 107/68    Pulse: 87 84 86 84  Resp: '15 14 17 14  '$ Temp:      TempSrc:      SpO2: 91% 90% (!) 89% 94%    Intake/Output Summary (Last 24 hours) at 02/07/2021 0936 Last data filed at 02/07/2021 0925 Gross per 24 hour  Intake 650 ml  Output --  Net 650 ml   There were no vitals filed for this visit.  Examination:  General exam: Awake alert oriented to self and partly to place, communication limited due to dementia and language barrier  HEENT: No JVD CVS: S1-S2, regular rate rhythm Lungs: Poor air movement bilaterally Abdomen: Soft, nontender, bowel sounds present Extremities: Right leg immobilized in Buck's traction Skin: No rash on exposed skin Psych: Pleasant, poor insight and judgment   Data Reviewed:   CBC: Recent Labs  Lab 02/06/21 2210 02/07/21 0430  WBC 8.6 7.1  NEUTROABS 6.6  --   HGB 13.7 12.4  HCT  44.5 40.5  MCV 94.1 93.8  PLT 145* 0000000   Basic Metabolic Panel: Recent Labs  Lab 02/06/21 2210 02/07/21 0430  NA 144 140  K 4.3 4.7  CL 107 103  CO2 31 28  GLUCOSE 120* 145*  BUN 13 13  CREATININE 0.88 0.88  CALCIUM 9.2 8.7*   GFR: CrCl cannot be calculated (Unknown ideal weight.). Liver Function Tests: Recent Labs  Lab 02/07/21 0430  AST 33  ALT 25  ALKPHOS 70  BILITOT 0.8  PROT 6.1*  ALBUMIN 3.2*   No results for input(s): LIPASE, AMYLASE in the last 168 hours. No results for input(s): AMMONIA in the last 168 hours. Coagulation Profile: No results for input(s): INR, PROTIME in the last 168 hours. Cardiac Enzymes: No  results for input(s): CKTOTAL, CKMB, CKMBINDEX, TROPONINI in the last 168 hours. BNP (last 3 results) No results for input(s): PROBNP in the last 8760 hours. HbA1C: No results for input(s): HGBA1C in the last 72 hours. CBG: No results for input(s): GLUCAP in the last 168 hours. Lipid Profile: No results for input(s): CHOL, HDL, LDLCALC, TRIG, CHOLHDL, LDLDIRECT in the last 72 hours. Thyroid Function Tests: No results for input(s): TSH, T4TOTAL, FREET4, T3FREE, THYROIDAB in the last 72 hours. Anemia Panel: No results for input(s): VITAMINB12, FOLATE, FERRITIN, TIBC, IRON, RETICCTPCT in the last 72 hours. Urine analysis: No results found for: COLORURINE, APPEARANCEUR, LABSPEC, PHURINE, GLUCOSEU, HGBUR, BILIRUBINUR, KETONESUR, PROTEINUR, UROBILINOGEN, NITRITE, LEUKOCYTESUR Sepsis Labs: '@LABRCNTIP'$ (procalcitonin:4,lacticidven:4)  ) Recent Results (from the past 240 hour(s))  Resp Panel by RT-PCR (Flu A&B, Covid) Nasopharyngeal Swab     Status: None   Collection Time: 02/06/21 10:32 PM   Specimen: Nasopharyngeal Swab; Nasopharyngeal(NP) swabs in vial transport medium  Result Value Ref Range Status   SARS Coronavirus 2 by RT PCR NEGATIVE NEGATIVE Final    Comment: (NOTE) SARS-CoV-2 target nucleic acids are NOT DETECTED.  The SARS-CoV-2 RNA is generally detectable in upper respiratory specimens during the acute phase of infection. The lowest concentration of SARS-CoV-2 viral copies this assay can detect is 138 copies/mL. A negative result does not preclude SARS-Cov-2 infection and should not be used as the sole basis for treatment or other patient management decisions. A negative result may occur with  improper specimen collection/handling, submission of specimen other than nasopharyngeal swab, presence of viral mutation(s) within the areas targeted by this assay, and inadequate number of viral copies(<138 copies/mL). A negative result must be combined with clinical observations,  patient history, and epidemiological information. The expected result is Negative.  Fact Sheet for Patients:  EntrepreneurPulse.com.au  Fact Sheet for Healthcare Providers:  IncredibleEmployment.be  This test is no t yet approved or cleared by the [place] FDA and  has been authorized for detection and/or diagnosis of SARS-CoV-2 by FDA under an Emergency Use Authorization (EUA). This EUA will remain  in effect (meaning this test can be used) for the duration of the COVID-19 declaration under Section 564(b)(1) of the Act, 21 U.S.C.section 360bbb-3(b)(1), unless the authorization is terminated  or revoked sooner.       Influenza A by PCR NEGATIVE NEGATIVE Final   Influenza B by PCR NEGATIVE NEGATIVE Final    Comment: (NOTE) The Xpert Xpress SARS-CoV-2/FLU/RSV plus assay is intended as an aid in the diagnosis of influenza from Nasopharyngeal swab specimens and should not be used as a sole basis for treatment. Nasal washings and aspirates are unacceptable for Xpert Xpress SARS-CoV-2/FLU/RSV testing.  Fact Sheet for Patients: EntrepreneurPulse.com.au  Fact Sheet for  Healthcare Providers: IncredibleEmployment.be  This test is not yet approved or cleared by the [place] and has been authorized for detection and/or diagnosis of SARS-CoV-2 by FDA under an Emergency Use Authorization (EUA). This EUA will remain in effect (meaning this test can be used) for the duration of the COVID-19 declaration under Section 564(b)(1) of the Act, 21 U.S.C. section 360bbb-3(b)(1), unless the authorization is terminated or revoked.  Performed at [facility], [place] [address] [place], [place] [address]          Radiology Studies: Chest Portable 1 View  Result Date: 02/06/2021 CLINICAL DATA:  Preop knee surgery EXAM: PORTABLE CHEST 1 VIEW COMPARISON:  01/13/2021 FINDINGS: The heart size and  mediastinal contours are within normal limits. Both lungs are clear. The visualized skeletal structures are unremarkable. Low lung volumes. IMPRESSION: No active disease. Electronically Signed   By: Donavan Foil M.D.   On: 02/06/2021 23:20   DG Knee Complete 4 Views Right  Result Date: 02/06/2021 CLINICAL DATA:  Status post fall. EXAM: RIGHT KNEE - COMPLETE 4+ VIEW COMPARISON:  None. FINDINGS: Acute fracture deformity is seen involving the distal right femoral shaft. Medial angulation of the distal fracture site is seen. There is no evidence of dislocation. There is marked severity medial and lateral tibiofemoral compartment space narrowing. A small joint effusion is seen. IMPRESSION: 1. Acute fracture of the distal right femoral shaft. 2. Marked severity degenerative changes. 3. Small joint effusion. Electronically Signed   By: Virgina Norfolk M.D.   On: 02/06/2021 21:30   DG Hip Unilat W or Wo Pelvis 2-3 Views Right  Result Date: 02/06/2021 CLINICAL DATA:  Fall, injury. EXAM: DG HIP (WITH OR WITHOUT PELVIS) 2-3V RIGHT COMPARISON:  None. FINDINGS: The bones are diffusely osteopenic. This limits evaluation for subtle nondisplaced fracture. A right hip arthroplasty appears in anatomic alignment. There is no acute fracture or hardware loosening identified. Soft tissues are within normal limits. Degenerative changes affect the lumbar spine. IMPRESSION: 1. Examination is limited by osteopenia. No definite acute fracture or dislocation. 2. Right hip arthroplasty in anatomic alignment. Electronically Signed   By: Ronney Asters M.D.   On: 02/06/2021 21:32        Scheduled Meds:  acetaminophen  1,000 mg Oral Once   dexamethasone (DECADRON) injection  8 mg Intravenous Once   povidone-iodine  2 application Topical Once   [MAR Hold] senna-docusate  1 tablet Oral BID   Continuous Infusions:   ceFAZolin (ANCEF) IV     tranexamic acid     tranexamic acid       LOS: 1 day    Time spent:  91mn    PDomenic Polite MD [facility]   02/07/2021, 9:36 AM

## 2021-02-07 NOTE — Transfer of Care (Signed)
Immediate Anesthesia Transfer of Care Note  Patient: Sabrina Page  Procedure(s) Performed: OPEN REDUCTION INTERNAL FIXATION (ORIF) DISTAL FEMUR FRACTURE (Right: Leg Upper)  Patient Location: PACU  Anesthesia Type:Spinal  Level of Consciousness: awake and patient cooperative  Airway & Oxygen Therapy: Patient Spontanous Breathing and Patient connected to face mask oxygen  Post-op Assessment: Report given to RN and Post -op Vital signs reviewed and stable  Post vital signs: Reviewed and stable  Last Vitals:  Vitals Value Taken Time  BP 103/62 02/07/21 1135  Temp    Pulse 80 02/07/21 1136  Resp 15 02/07/21 1138  SpO2 96 % 02/07/21 1136  Vitals shown include unvalidated device data.  Last Pain:  Vitals:   02/07/21 0611  TempSrc:   PainSc: 3          Complications: No notable events documented.

## 2021-02-07 NOTE — Progress Notes (Signed)
Initial Nutrition Assessment  DOCUMENTATION CODES:   Not applicable  INTERVENTION:   Ensure Enlive po BID, each supplement provides 350 kcal and 20 grams of protein  MVI po daily   Recommend dysphagia 3 diet   NUTRITION DIAGNOSIS:   Increased nutrient needs related to hip fracture as evidenced by estimated needs.  GOAL:   Patient will meet greater than or equal to 90% of their needs  MONITOR:   Supplement acceptance, PO intake, Labs, Weight trends, Skin, I & O's  REASON FOR ASSESSMENT:   Consult Hip fracture protocol  ASSESSMENT:   87 y.o. female with medical history significant of anxiety, chronic diastolic heart failure, COPD, Alzheimer's dementia, GERD and orthostatic hypotension who is admitted with hip fracture after fall now s/p R ORIF 9/17  RD working remotely.  Pt in surgery at time of RD visit. RD unsure if pt will be able to provide nutrition related history r/t dementia. Pt is also noted to speak [place]. There is no height or weight documented in chart to determine if any significant weight changes. RD will add supplements and MVI to help pt meet her estimated needs. Pt NPO today for ORIF. RD will obtain nutrition related exam at follow up.   Medications reviewed and include: vitamin D, colace, lovenox, lasix, KCl, protonix, senokot, cefazolin  Labs reviewed:   NUTRITION - FOCUSED PHYSICAL EXAM: Unable to perform at this time   Diet Order:   Diet Order     None      EDUCATION NEEDS:   Not appropriate for education at this time  Skin:  Skin Assessment: Reviewed RN Assessment (incision R hip)  Last BM:  pta  Height:   Ht Readings from Last 1 Encounters:  No data found for Ht    Weight:   Wt Readings from Last 1 Encounters:  No data found for Wt   Estimated Nutritional Needs:   Kcal:  1400-1600kcal/day  Protein:  70-80g/day  Fluid:  1.4-1.6L/day  Koleen Distance MS, RD, LDN Please refer to [facility] for RD and/or RD  on-call/weekend/after hours pager

## 2021-02-07 NOTE — Anesthesia Preprocedure Evaluation (Addendum)
Anesthesia Evaluation  Patient identified by MRN, date of birth, ID band Patient confused    Reviewed: Allergy & Precautions, NPO status , Patient's Chart, lab work & pertinent test results  Airway Mallampati: III  TM Distance: >3 FB Neck ROM: Full    Dental no notable dental hx.    Pulmonary COPD, former smoker,    Pulmonary exam normal breath sounds clear to auscultation       Cardiovascular +CHF  Normal cardiovascular exam Rhythm:Regular Rate:Normal  ECG: SR, rate 82   Neuro/Psych PSYCHIATRIC DISORDERS Anxiety Depression Dementia negative neurological ROS     GI/Hepatic Neg liver ROS, GERD  Medicated and Controlled,  Endo/Other  negative endocrine ROS  Renal/GU negative Renal ROS     Musculoskeletal Does not ambulate   Abdominal (+) + obese,   Peds  Hematology negative hematology ROS (+)   Anesthesia Other Findings right femur fracture  Reproductive/Obstetrics                            Anesthesia Physical Anesthesia Plan  ASA: 3  Anesthesia Plan: Spinal   Post-op Pain Management:    Induction: Intravenous  PONV Risk Score and Plan: 2 and Ondansetron, Dexamethasone, Propofol infusion and Treatment may vary due to age or medical condition  Airway Management Planned: Simple Face Mask  Additional Equipment:   Intra-op Plan:   Post-operative Plan: Extubation in OR  Informed Consent: I have reviewed the patients History and Physical, chart, labs and discussed the procedure including the risks, benefits and alternatives for the proposed anesthesia with the patient or authorized representative who has indicated his/her understanding and acceptance.   Patient has DNR.  Discussed DNR with power of attorney and Suspend DNR.   Dental advisory given, Interpreter used for interveiw and Consent reviewed with POA  Plan Discussed with: CRNA  Anesthesia Plan Comments: (Anesthetic plan  discussed with daughter via telephone)      Anesthesia Quick Evaluation

## 2021-02-07 NOTE — Anesthesia Postprocedure Evaluation (Signed)
Anesthesia Post Note  Patient: Merritt Pella  Procedure(s) Performed: OPEN REDUCTION INTERNAL FIXATION (ORIF) DISTAL FEMUR FRACTURE (Right: Leg Upper)     Patient location during evaluation: PACU Anesthesia Type: Spinal Level of consciousness: awake Pain management: pain level controlled Vital Signs Assessment: post-procedure vital signs reviewed and stable Respiratory status: spontaneous breathing, respiratory function stable and patient connected to nasal cannula oxygen Cardiovascular status: blood pressure returned to baseline and stable Postop Assessment: no headache, no backache and no apparent nausea or vomiting Anesthetic complications: no   No notable events documented.  Last Vitals:  Vitals:   02/07/21 1226 02/07/21 1308  BP:  (!) 130/56  Pulse: 78 79  Resp: 18 20  Temp:  36.9 C  SpO2: 100% 100%    Last Pain:  Vitals:   02/07/21 1308  TempSrc: Oral  PainSc:                  Kaegan Stigler P Indya Oliveria

## 2021-02-07 NOTE — Anesthesia Procedure Notes (Signed)
Spinal  Patient location during procedure: OR Start time: 02/07/2021 9:20 AM End time: 02/07/2021 9:25 AM Reason for block: surgical anesthesia Staffing Performed: anesthesiologist  Anesthesiologist: Murvin Natal, MD Preanesthetic Checklist Completed: patient identified, IV checked, risks and benefits discussed, surgical consent, monitors and equipment checked, pre-op evaluation and timeout performed Spinal Block Patient position: right lateral decubitus Prep: DuraPrep Patient monitoring: cardiac monitor, continuous pulse ox and blood pressure Approach: midline Location: L4-5 Injection technique: single-shot Needle Needle type: Pencan  Needle gauge: 24 G Needle length: 9 cm Assessment Sensory level: T10 Events: CSF return Additional Notes Functioning IV was confirmed and monitors were applied. Sterile prep and drape, including hand hygiene and sterile gloves were used. The patient was positioned and the spine was prepped. The skin was anesthetized with lidocaine.  Free flow of clear CSF was obtained prior to injecting local anesthetic into the CSF.  The spinal needle aspirated freely following injection.  The needle was carefully withdrawn.  The patient tolerated the procedure well.

## 2021-02-08 DIAGNOSIS — F418 Other specified anxiety disorders: Secondary | ICD-10-CM | POA: Diagnosis not present

## 2021-02-08 DIAGNOSIS — G309 Alzheimer's disease, unspecified: Secondary | ICD-10-CM | POA: Diagnosis not present

## 2021-02-08 DIAGNOSIS — S72401A Unspecified fracture of lower end of right femur, initial encounter for closed fracture: Secondary | ICD-10-CM | POA: Diagnosis not present

## 2021-02-08 DIAGNOSIS — I5032 Chronic diastolic (congestive) heart failure: Secondary | ICD-10-CM | POA: Diagnosis not present

## 2021-02-08 LAB — BASIC METABOLIC PANEL
Anion gap: 9 (ref 5–15)
BUN: 15 mg/dL (ref 8–23)
CO2: 29 mmol/L (ref 22–32)
Calcium: 8.6 mg/dL — ABNORMAL LOW (ref 8.9–10.3)
Chloride: 100 mmol/L (ref 98–111)
Creatinine, Ser: 0.7 mg/dL (ref 0.44–1.00)
GFR, Estimated: >60 mL/min (ref >60)
Glucose, Bld: 119 mg/dL — ABNORMAL HIGH (ref 70–99)
Potassium: 4.5 mmol/L (ref 3.5–5.1)
Sodium: 138 mmol/L (ref 135–145)

## 2021-02-08 LAB — CBC
HCT: 33.6 % — ABNORMAL LOW (ref 36.0–46.0)
Hemoglobin: 10.7 g/dL — ABNORMAL LOW (ref 12.0–15.0)
MCH: 29.1 pg (ref 26.0–34.0)
MCHC: 31.8 g/dL (ref 30.0–36.0)
MCV: 91.3 fL (ref 80.0–100.0)
Platelets: 122 10*3/uL — ABNORMAL LOW (ref 150–400)
RBC: 3.68 MIL/uL — ABNORMAL LOW (ref 3.87–5.11)
RDW: 13.5 % (ref 11.5–15.5)
WBC: 7.2 10*3/uL (ref 4.0–10.5)
nRBC: 0 % (ref 0.0–0.2)

## 2021-02-08 MED ORDER — CHLORHEXIDINE GLUCONATE CLOTH 2 % EX PADS
6.0000 | MEDICATED_PAD | Freq: Every day | CUTANEOUS | Status: DC
  Administered 2021-02-08 – 2021-02-10 (×3): 6 via TOPICAL

## 2021-02-08 NOTE — Progress Notes (Signed)
Foley removed per order, purewick in place.

## 2021-02-08 NOTE — Evaluation (Signed)
Occupational Therapy Evaluation Patient Details Name: Sabrina Page MRN: [id number] DOB: [birth year] Today's Date: 02/08/2021   History of Present Illness Patient is a 87 year old female who presented to the hospital after a fall at home. patient was found to have acute fracture of right distal femoral shaft with severe DJD and small effusion. PMH: chronic diastolic CHF, COPD, alzheimer's, GERD, orthostatic   Clinical Impression   Patient is a 87 year old female who was admitted for above. Patient was livign at home with family support for ADL tasks. Currently, patient is max A x 2 for supine to sit on edge of bed with max A for sitting balance on edge of bed with patient demonstrating posterior leaning.  Patient was also noted to need increased assistance with self feeding tasks. Patient would continue to benefit from skilled OT services at this time while admitted to address noted deficits in order to improve overall safety, prevent learned helplessness and independence in ADLs.       Recommendations for follow up therapy are one component of a multi-disciplinary discharge planning process, led by the attending physician.  Recommendations may be updated based on patient status, additional functional criteria and insurance authorization.   Follow Up Recommendations  SNF (pending family level of assist at home)    Equipment Recommendations  None recommended by OT    Recommendations for Other Services       Precautions / Restrictions Precautions Precautions: Fall Precaution Comments: orthostatic, speaks russian Required Braces or Orthoses: Knee Immobilizer - Right Restrictions Weight Bearing Restrictions: Yes RLE Weight Bearing: Partial weight bearing RLE Partial Weight Bearing Percentage or Pounds: 50%      Mobility Bed Mobility Overal bed mobility: Needs Assistance Bed Mobility: Supine to Sit;Rolling;Sit to Supine Rolling: Max assist   Supine to sit: Max assist;+2 for  safety/equipment;+2 for physical assistance;HOB elevated Sit to supine: Max assist;+2 for physical assistance;+2 for safety/equipment   General bed mobility comments: patient reported feeling dizziness sitting on edge of bed and was transitioned back to supine.    Transfers                      Balance Overall balance assessment: Needs assistance Sitting-balance support: Feet supported;Bilateral upper extremity supported Sitting balance-Leahy Scale: Zero   Postural control: Posterior lean                                 ADL either performed or assessed with clinical judgement   ADL Overall ADL's : At baseline                                       General ADL Comments: patient would appear to be at baseline for toileting, bathing and dressing tasks at this time. patients caregiver did report that patient was able to participate in self feeding herself at home. patient was observed during breakfast this AM with patient not intiating any participation in self feeding with education provided to  NT assisting to participate in meal.     Vision   Additional Comments: difficult to assess with patients cogntiive deficits and language barrier.     Perception     Praxis      Pertinent Vitals/Pain Pain Assessment: Faces Faces Pain Scale: Hurts even more Pain Location: R knee Pain Descriptors / Indicators: Guarding;Discomfort;Grimacing  Pain Intervention(s): Limited activity within patient's tolerance;Monitored during session;Repositioned;Patient requesting pain meds-RN notified     Hand Dominance     Extremity/Trunk Assessment Upper Extremity Assessment Upper Extremity Assessment: Overall WFL for tasks assessed   Lower Extremity Assessment Lower Extremity Assessment: Defer to PT evaluation       Communication Communication Communication: Prefers language other than English (russian)   Cognition Arousal/Alertness: Awake/alert Behavior  During Therapy: WFL for tasks assessed/performed;Flat affect Overall Cognitive Status: History of cognitive impairments - at baseline                                 General Comments: patients caregiver (neighbor) was present in room and was able to assist with translating for patient. also able to provide PLOF from last two years as well.   General Comments       Exercises     Shoulder Instructions      Home Living Family/patient expects to be discharged to:: Skilled nursing facility Living Arrangements: Spouse/significant other;Other (Comment)                                      Prior Functioning/Environment Level of Independence: Needs assistance  Gait / Transfers Assistance Needed: patient's family would use total lift to move patient from bed to chair. patients family were moving wheelchair to each location for patient. ADL's / Homemaking Assistance Needed: patient had assistance from caregivers for all ADLs. patient was able to participate in self feeding at home. patient was able to engage in standing for seconds at home with caregivers/family            OT Problem List: Decreased strength;Decreased activity tolerance;Impaired balance (sitting and/or standing);Decreased cognition;Decreased knowledge of precautions;Decreased safety awareness;Pain      OT Treatment/Interventions: Self-care/ADL training;Neuromuscular education;DME and/or AE instruction;Therapeutic activities;Balance training;Patient/family education    OT Goals(Current goals can be found in the care plan section) Acute Rehab OT Goals Patient Stated Goal: none stated OT Goal Formulation: Patient unable to participate in goal setting Time For Goal Achievement: 02/22/21 Potential to Achieve Goals: Fair  OT Frequency: Min 2X/week   Barriers to D/C:            Co-evaluation PT/OT/SLP Co-Evaluation/Treatment: Yes Reason for Co-Treatment: For patient/therapist safety;To  address functional/ADL transfers PT goals addressed during session: Mobility/safety with mobility OT goals addressed during session: ADL's and self-care      AM-PAC OT "6 Clicks" Daily Activity     Outcome Measure Help from another person eating meals?: Total Help from another person taking care of personal grooming?: Total Help from another person toileting, which includes using toliet, bedpan, or urinal?: Total Help from another person bathing (including washing, rinsing, drying)?: Total Help from another person to put on and taking off regular upper body clothing?: Total Help from another person to put on and taking off regular lower body clothing?: Total 6 Click Score: 6   End of Session Equipment Utilized During Treatment: Right knee immobilizer Nurse Communication: Other (comment) (patients pain levels and particiaption in therapy)  Activity Tolerance: Patient limited by pain Patient left: in bed;with call bell/phone within reach;with family/visitor present  OT Visit Diagnosis: Muscle weakness (generalized) (M62.81);Feeding difficulties (R63.3)                Time: [id number] OT Time Calculation (min): 35 min Charges:  OT  General Charges $OT Visit: 1 Visit OT Evaluation $OT Eval Moderate Complexity: 1 Mod  Jackelyn Poling OTR/L, MS Acute Rehabilitation Department Office# [phone] Pager# [phone]   Sabrina Page 02/08/2021, 4:47 PM

## 2021-02-08 NOTE — Progress Notes (Signed)
    Subjective: Patient reports pain as moderate. Worse with movement. Tolerating diet. Urinating. No CP, SOB. Not able to mobilize OOB at baseline so may not be able to take part in PT/OT. Speaks [place]. Felt dizzy when sitting up on side of bed.   Objective:   VITALS:   Vitals:   02/07/21 1716 02/08/21 0109 02/08/21 0630 02/08/21 1337  BP: 128/65 (!) 117/55 (!) 105/53 (!) 124/49  Pulse: 78 71 72 81  Resp: 17 (!) '22 20 16  '$ Temp: 97.7 F (36.5 C) 99.5 F (37.5 C) 98.7 F (37.1 C) 98.5 F (36.9 C)  TempSrc: Oral Oral  Oral  SpO2: 99% 98% 98% 99%   CBC Latest Ref Rng & Units 02/08/2021 02/07/2021 02/06/2021  WBC 4.0 - 10.5 K/uL 7.2 7.1 8.6  Hemoglobin 12.0 - 15.0 g/dL 10.7(L) 12.4 13.7  Hematocrit 36.0 - 46.0 % 33.6(L) 40.5 44.5  Platelets 150 - 400 K/uL 122(L) 152 145(L)   BMP Latest Ref Rng & Units 02/08/2021 02/07/2021 02/06/2021  Glucose 70 - 99 mg/dL 119(H) 145(H) 120(H)  BUN 8 - 23 mg/dL '15 13 13  '$ Creatinine 0.44 - 1.00 mg/dL 0.70 0.88 0.88  Sodium 135 - 145 mmol/L 138 140 144  Potassium 3.5 - 5.1 mmol/L 4.5 4.7 4.3  Chloride 98 - 111 mmol/L 100 103 107  CO2 22 - 32 mmol/L '29 28 31  '$ Calcium 8.9 - 10.3 mg/dL 8.6(L) 8.7(L) 9.2   Intake/Output      09/17 0701 09/18 0700 09/18 0701 09/19 0700   I.V. 750    IV Piggyback 200    Total Intake 950    Urine 500    Blood 500    Total Output 1000    Net -50            Physical Exam: General: NAD. Sitting up on side of bed trying to work with PT/OT. Ok until moved back to supine position then crying out in pain Resp: No increased wob Cardio: regular rate and rhythm ABD soft Neurologically intact MSK Neurovascularly intact Sensation intact distally Intact pulses distally Dorsiflexion/Plantar flexion intact Very TTP Incision: dressing C/D/I KI in place   Assessment: 1 Day Post-Op  S/P Procedure(s) (LRB): OPEN REDUCTION INTERNAL FIXATION (ORIF) DISTAL FEMUR FRACTURE (Right) by Dr. Ernesta Amble. Percell Miller on  02/07/21  Principal Problem:   Closed fracture of right distal femur ([place]) Active Problems:   Dementia due to Alzheimer's disease ([place])   Orthostatic hypotension   COPD (chronic obstructive pulmonary disease) ([facility])   Chronic diastolic heart failure ([facility])   GERD (gastroesophageal reflux disease)   Anxiety with depression   Prolonged QT interval   Plan:  Advance diet Up with therapy if possible Incentive Spirometry Elevate and Apply ice  Weightbearing: PWB 50%  RLE Insicional and dressing care: Dressings left intact until follow-up and Reinforce dressings as needed Orthopedic device(s):  KI Showering: Keep dressing dry VTE prophylaxis: Lovenox '40mg'$  qd  while inpatient, can switch to ASA '81mg'$  bid x 30 days upon d/c , SCDs, ambulation Pain control: Continue current regimen. Limit narcotics due to dementia. Follow - up plan: 2 weeks in the office Contact information:  Edmonia Lynch MD, Aggie Moats PA-C  Dispo:  TBD based on evals.  Will likely need SNF    Alisa Graff Office [phone] 02/08/2021, 1:50 PM

## 2021-02-08 NOTE — Evaluation (Signed)
Physical Therapy Evaluation Patient Details Name: Sabrina Page MRN: [id number] DOB: [birth year] Today's Date: 02/08/2021  History of Present Illness  Patient is a 87 year old female who presented to the hospital after a fall at home. patient was found to have acute fracture of right distal femoral shaft with severe DJD and small effusion. PMH: chronic diastolic CHF, COPD, alzheimer's, GERD, orthostatic  Clinical Impression   Patient is a 87 year old female who was admitted for above. Patient was living at home with family support for all mobility and ADL tasks. Currently, patient is max A x 2 for supine to sit on edge of bed with mod A for sitting balance on edge of bed with patient demonstrating posterior leaning.  At baseline, family utilizes lift equipment to move pt between chair and bed. Patient would continue to benefit from skilled PT services at this time while admitted to address noted deficits in order to improve overall safety, prevent learned helplessness and promote independence.  Pt would benefit from follow up rehab at SNF level to maximize IND and safety prior to return home - unless family feels they can continue to manage pt as she is not far below baseline level.     Recommendations for follow up therapy are one component of a multi-disciplinary discharge planning process, led by the attending physician.  Recommendations may be updated based on patient status, additional functional criteria and insurance authorization.  Follow Up Recommendations SNF    Equipment Recommendations  None recommended by PT    Recommendations for Other Services       Precautions / Restrictions Precautions Precautions: Fall Precaution Comments: Dizzy with sitting.  Caregiver reports orthostatic at home, speaks russian but understands limited english Required Braces or Orthoses: Knee Immobilizer - Right Restrictions Weight Bearing Restrictions: Yes RLE Weight Bearing: Partial weight  bearing RLE Partial Weight Bearing Percentage or Pounds: 50%      Mobility  Bed Mobility Overal bed mobility: Needs Assistance Bed Mobility: Supine to Sit;Rolling;Sit to Supine Rolling: Max assist   Supine to sit: Max assist;+2 for safety/equipment;+2 for physical assistance;HOB elevated Sit to supine: Max assist;+2 for physical assistance;+2 for safety/equipment   General bed mobility comments: Assist with LEs and trunk; utilizing bed pad to rotate to/from EOB sitting.  Pt balanced at EOB with min assist x 6 minutes but with c/o ongoing but not worsening dizziness.  BP supine 124/49 and sitting 129/60.  Neighbor reports pt is orthostatic at home    Transfers                 General transfer comment: NT 2* dizziness and limited participation with move to sitting.  Pt can stand for only "few seconds" at home  Ambulation/Gait             General Gait Details: Pt was nonambulatory prior to this admit  Stairs            Wheelchair Mobility    Modified Rankin (Stroke Patients Only)       Balance Overall balance assessment: Needs assistance Sitting-balance support: Feet supported;Bilateral upper extremity supported Sitting balance-Leahy Scale: Poor   Postural control: Posterior lean                                   Pertinent Vitals/Pain Pain Assessment: Faces Faces Pain Scale: Hurts even more Pain Location: R knee Pain Descriptors / Indicators: Guarding;Discomfort;Grimacing Pain Intervention(s): Limited  activity within patient's tolerance;Monitored during session;Patient requesting pain meds-RN notified;Ice applied    Home Living Family/patient expects to be discharged to:: Unsure Living Arrangements: Spouse/significant other;Other (Comment)               Additional Comments: Caregivers 24/7 per neighbor in room.  Pt has hospital bed, lift chair, wc and lift equipment    Prior Function Level of Independence: Needs assistance    Gait / Transfers Assistance Needed: patient's family would use total lift to move patient from bed to chair. patients family were moving wheelchair to each location for patient.  ADL's / Homemaking Assistance Needed: patient had assistance from caregivers for all ADLs. patient was able to participate in self feeding at home. patient was able to engage in standing for seconds at home with caregivers/family        Hand Dominance        Extremity/Trunk Assessment   Upper Extremity Assessment Upper Extremity Assessment: Overall WFL for tasks assessed    Lower Extremity Assessment Lower Extremity Assessment: RLE deficits/detail;Generalized weakness;Difficult to assess due to impaired cognition RLE Deficits / Details: KI in place       Communication   Communication: Prefers language other than English  Cognition Arousal/Alertness: Awake/alert Behavior During Therapy: WFL for tasks assessed/performed;Flat affect Overall Cognitive Status: History of cognitive impairments - at baseline                                 General Comments: patients caregiver (neighbor) was present in room and was able to assist with translating for patient. also able to provide PLOF from last two years as well.      General Comments      Exercises     Assessment/Plan    PT Assessment Patient needs continued PT services  PT Problem List Decreased strength;Decreased range of motion;Decreased activity tolerance;Decreased balance;Decreased mobility;Decreased cognition;Decreased knowledge of use of DME;Obesity;Pain       PT Treatment Interventions DME instruction;Functional mobility training;Therapeutic activities;Therapeutic exercise;Balance training;Cognitive remediation;Patient/family education    PT Goals (Current goals can be found in the Care Plan section)  Acute Rehab PT Goals Patient Stated Goal: none stated PT Goal Formulation: With patient Time For Goal Achievement:  02/22/21 Potential to Achieve Goals: Fair    Frequency Min 2X/week   Barriers to discharge        Co-evaluation PT/OT/SLP Co-Evaluation/Treatment: Yes Reason for Co-Treatment: For patient/therapist safety PT goals addressed during session: Mobility/safety with mobility OT goals addressed during session: ADL's and self-care       AM-PAC PT "6 Clicks" Mobility  Outcome Measure Help needed turning from your back to your side while in a flat bed without using bedrails?: A Lot Help needed moving from lying on your back to sitting on the side of a flat bed without using bedrails?: A Lot Help needed moving to and from a bed to a chair (including a wheelchair)?: Total Help needed standing up from a chair using your arms (e.g., wheelchair or bedside chair)?: Total Help needed to walk in hospital room?: Total Help needed climbing 3-5 steps with a railing? : Total 6 Click Score: 8    End of Session   Activity Tolerance: Patient limited by fatigue Patient left: in bed;with call bell/phone within reach;with bed alarm set;with family/visitor present Nurse Communication: Mobility status PT Visit Diagnosis: Muscle weakness (generalized) (M62.81);Pain;Difficulty in walking, not elsewhere classified (R26.2) Pain - Right/Left: Right Pain -  part of body: Knee    Time: 1344-1415 PT Time Calculation (min) (ACUTE ONLY): 31 min   Charges:   PT Evaluation $PT Eval Moderate Complexity: [place] Pager [phone] Office [phone]   Mozell Hardacre 02/08/2021, 5:43 PM

## 2021-02-08 NOTE — Progress Notes (Addendum)
PROGRESS NOTE    Sabrina Page  [id number] DOB: [birth year] DOA: 02/06/2021 PCP: Cassandria Anger, MD  Brief Narrative:Sabrina Page is a 87 y.o. female with medical history significant of anxiety, chronic diastolic heart failure, COPD, Alzheimer's dementia, GERD, orthostatic hypotension who was admitted and discharged last month due to COVID-19 and was brought to the ED from home, lives w/ spouse and caregiver following a mechanical fall, history was obtained from patient's daughter reported that she is able to stand but does not ambulate at baseline. -Denies any history of dizziness, no loss of consciousness -Work-up in the ED noted right distal femur shaft fracture with severe DJD. -Orthopedics was consulted and patient was admitted to [facility]:   Closed fracture of right distal femur ([place]) -Orthopedics consulting, underwent ORIF yesterday -Lovenox for DVT prophylaxis -PT OT consult -may not be able to do rehab, not ambulatory at baseline   Orthostatic hypotension from dysautonomia related to idiopathic peripheral neuropathy -Continue Florinef and Mestinon, midodrine held for prolonged QTC -h/o recurrent syncope from this   Prolonged QT interval -Given IV mag on admission, monitor on telemetry -Held Abilify, midodrine, recheck EKG     Dementia due to Alzheimer's disease ([place]) Continue memantine 10 mg p.o. twice daily.     Anxiety with depression -Abilify held with prolonged QT     COPD (chronic obstructive pulmonary disease) (HCC) -Stable, no wheezing at this time , bronchodilators as needed.     Chronic diastolic heart failure (HCC) -Clinically euvolemic, monitor     GERD (gastroesophageal reflux disease) Continue PPI.    DVT prophylaxis:      Lovenox Code Status:                         DNR. Family Communication:   No family at bedside, called and updated daughter Disposition Plan:  Status is: Inpatient  Remains inpatient  appropriate because:Inpatient level of care appropriate due to severity of illness  Dispo: The patient is from: ALF              Anticipated d/c is to: SNF              Patient currently is not medically stable to d/c.   Difficult to place patient No        Consultants:  Orthopedics Dr. Percell Miller  Procedures: [id number]  PROCEDURE:  OPEN REDUCTION INTERNAL FIXATION (ORIF) DISTAL FEMUR FRACTURE  SURGEON:  Renette Butters, MD  Antimicrobials:    Subjective: -Foley catheter removed this morning, no other events overnight  Objective: Vitals:   02/07/21 1308 02/07/21 1716 02/08/21 0109 02/08/21 0630  BP: (!) 130/56 128/65 (!) 117/55 (!) 105/53  Pulse: 79 78 71 72  Resp: 20 17 (!) 22 20  Temp: 98.4 F (36.9 C) 97.7 F (36.5 C) 99.5 F (37.5 C) 98.7 F (37.1 C)  TempSrc: Oral Oral Oral   SpO2: 100% 99% 98% 98%    Intake/Output Summary (Last 24 hours) at 02/08/2021 1023 Last data filed at 02/08/2021 0100 Gross per 24 hour  Intake 750 ml  Output 1000 ml  Net -250 ml   There were no vitals filed for this visit.  Examination:  General exam: Pleasant female, sitting up in bed, awake alert oriented to self, speaks few words English HEENT: No JVD CVS: Rhythm Lungs: Clear bilaterally Abdomen: Soft, nontender, bowel sounds present, bruise noted Extremities: Right leg with brace  Skin: No rash on exposed skin Psych: Pleasant,  poor insight and judgment   Data Reviewed:   CBC: Recent Labs  Lab 02/06/21 2210 02/07/21 0430 02/08/21 0536  WBC 8.6 7.1 7.2  NEUTROABS 6.6  --   --   HGB 13.7 12.4 10.7*  HCT 44.5 40.5 33.6*  MCV 94.1 93.8 91.3  PLT 145* 152 [id number]*   Basic Metabolic Panel: Recent Labs  Lab 02/06/21 2210 02/07/21 0430 02/08/21 0536  NA 144 140 138  K 4.3 4.7 4.5  CL 107 103 100  CO2 '31 28 29  '$ GLUCOSE 120* 145* 119*  BUN '13 13 15  '$ CREATININE 0.88 0.88 0.70  CALCIUM 9.2 8.7* 8.6*   GFR: CrCl cannot be calculated (Unknown ideal weight.). Liver Function  Tests: Recent Labs  Lab 02/07/21 0430  AST 33  ALT 25  ALKPHOS 70  BILITOT 0.8  PROT 6.1*  ALBUMIN 3.2*   No results for input(s): LIPASE, AMYLASE in the last 168 hours. No results for input(s): AMMONIA in the last 168 hours. Coagulation Profile: No results for input(s): INR, PROTIME in the last 168 hours. Cardiac Enzymes: No results for input(s): CKTOTAL, CKMB, CKMBINDEX, TROPONINI in the last 168 hours. BNP (last 3 results) No results for input(s): PROBNP in the last 8760 hours. HbA1C: No results for input(s): HGBA1C in the last 72 hours. CBG: No results for input(s): GLUCAP in the last 168 hours. Lipid Profile: No results for input(s): CHOL, HDL, LDLCALC, TRIG, CHOLHDL, LDLDIRECT in the last 72 hours. Thyroid Function Tests: No results for input(s): TSH, T4TOTAL, FREET4, T3FREE, THYROIDAB in the last 72 hours. Anemia Panel: No results for input(s): VITAMINB12, FOLATE, FERRITIN, TIBC, IRON, RETICCTPCT in the last 72 hours. Urine analysis: No results found for: COLORURINE, APPEARANCEUR, LABSPEC, PHURINE, GLUCOSEU, HGBUR, BILIRUBINUR, KETONESUR, PROTEINUR, UROBILINOGEN, NITRITE, LEUKOCYTESUR Sepsis Labs: '@LABRCNTIP'$ (procalcitonin:4,lacticidven:4)  ) Recent Results (from the past 240 hour(s))  Resp Panel by RT-PCR (Flu A&B, Covid) Nasopharyngeal Swab     Status: None   Collection Time: 02/06/21 10:32 PM   Specimen: Nasopharyngeal Swab; Nasopharyngeal(NP) swabs in vial transport medium  Result Value Ref Range Status   SARS Coronavirus 2 by RT PCR NEGATIVE NEGATIVE Final    Comment: (NOTE) SARS-CoV-2 target nucleic acids are NOT DETECTED.  The SARS-CoV-2 RNA is generally detectable in upper respiratory specimens during the acute phase of infection. The lowest concentration of SARS-CoV-2 viral copies this assay can detect is 138 copies/mL. A negative result does not preclude SARS-Cov-2 infection and should not be used as the sole basis for treatment or other patient  management decisions. A negative result may occur with  improper specimen collection/handling, submission of specimen other than nasopharyngeal swab, presence of viral mutation(s) within the areas targeted by this assay, and inadequate number of viral copies(<138 copies/mL). A negative result must be combined with clinical observations, patient history, and epidemiological information. The expected result is Negative.  Fact Sheet for Patients:  EntrepreneurPulse.com.au  Fact Sheet for Healthcare Providers:  IncredibleEmployment.be  This test is no t yet approved or cleared by the [place] FDA and  has been authorized for detection and/or diagnosis of SARS-CoV-2 by FDA under an Emergency Use Authorization (EUA). This EUA will remain  in effect (meaning this test can be used) for the duration of the COVID-19 declaration under Section 564(b)(1) of the Act, 21 U.S.C.section 360bbb-3(b)(1), unless the authorization is terminated  or revoked sooner.       Influenza A by PCR NEGATIVE NEGATIVE Final   Influenza B by PCR NEGATIVE NEGATIVE Final  Comment: (NOTE) The Xpert Xpress SARS-CoV-2/FLU/RSV plus assay is intended as an aid in the diagnosis of influenza from Nasopharyngeal swab specimens and should not be used as a sole basis for treatment. Nasal washings and aspirates are unacceptable for Xpert Xpress SARS-CoV-2/FLU/RSV testing.  Fact Sheet for Patients: EntrepreneurPulse.com.au  Fact Sheet for Healthcare Providers: IncredibleEmployment.be  This test is not yet approved or cleared by the [place] FDA and has been authorized for detection and/or diagnosis of SARS-CoV-2 by FDA under an Emergency Use Authorization (EUA). This EUA will remain in effect (meaning this test can be used) for the duration of the COVID-19 declaration under Section 564(b)(1) of the Act, 21 U.S.C. section 360bbb-3(b)(1),  unless the authorization is terminated or revoked.  Performed at [facility], [place] [address] [place], [place] [address]      Radiology Studies: DG Knee 1-2 Views Right  Result Date: 02/07/2021 CLINICAL DATA:  Right distal femur fracture ORIF EXAM: RIGHT KNEE - 1-2 VIEW COMPARISON:  Knee radiographs dated 1 day prior FINDINGS: Three C-arm fluoroscopic images were obtained intraoperatively and submitted for post operative interpretation. There has been interval sideplate and screw fixation of the displaced distal femur fracture. Alignment is significantly improved. There is no evidence of immediate complication. Fluoro time 34 seconds. Please see the performing provider's procedural report for further detail. IMPRESSION: Status post sideplate and screw fixation of the distal femur with improved fracture alignment. Electronically Signed   By: Valetta Mole M.D.   On: 02/07/2021 11:56   Chest Portable 1 View  Result Date: 02/06/2021 CLINICAL DATA:  Preop knee surgery EXAM: PORTABLE CHEST 1 VIEW COMPARISON:  01/13/2021 FINDINGS: The heart size and mediastinal contours are within normal limits. Both lungs are clear. The visualized skeletal structures are unremarkable. Low lung volumes. IMPRESSION: No active disease. Electronically Signed   By: Donavan Foil M.D.   On: 02/06/2021 23:20   DG Knee Complete 4 Views Right  Result Date: 02/06/2021 CLINICAL DATA:  Status post fall. EXAM: RIGHT KNEE - COMPLETE 4+ VIEW COMPARISON:  None. FINDINGS: Acute fracture deformity is seen involving the distal right femoral shaft. Medial angulation of the distal fracture site is seen. There is no evidence of dislocation. There is marked severity medial and lateral tibiofemoral compartment space narrowing. A small joint effusion is seen. IMPRESSION: 1. Acute fracture of the distal right femoral shaft. 2. Marked severity degenerative changes. 3. Small joint effusion. Electronically Signed   By: Virgina Norfolk M.D.   On: 02/06/2021 21:30   DG C-Arm 1-60 Min-No Report  Result Date: 02/07/2021 Fluoroscopy was utilized by the requesting physician.  No radiographic interpretation.   DG C-Arm 1-60 Min-No Report  Result Date: 02/07/2021 Fluoroscopy was utilized by the requesting physician.  No radiographic interpretation.   DG Hip Unilat W or Wo Pelvis 2-3 Views Right  Result Date: 02/06/2021 CLINICAL DATA:  Fall, injury. EXAM: DG HIP (WITH OR WITHOUT PELVIS) 2-3V RIGHT COMPARISON:  None. FINDINGS: The bones are diffusely osteopenic. This limits evaluation for subtle nondisplaced fracture. A right hip arthroplasty appears in anatomic alignment. There is no acute fracture or hardware loosening identified. Soft tissues are within normal limits. Degenerative changes affect the lumbar spine. IMPRESSION: 1. Examination is limited by osteopenia. No definite acute fracture or dislocation. 2. Right hip arthroplasty in anatomic alignment. Electronically Signed   By: Ronney Asters M.D.   On: 02/06/2021 21:32        Scheduled Meds:  Chlorhexidine Gluconate Cloth  6  each Topical Daily   cholecalciferol  125 mcg Oral Daily   docusate sodium  100 mg Oral BID   enoxaparin (LOVENOX) injection  40 mg Subcutaneous Q24H   feeding supplement  237 mL Oral BID BM   fludrocortisone  0.1 mg Oral Daily   FLUoxetine  40 mg Oral Daily   furosemide  20 mg Oral QODAY   loratadine  10 mg Oral q morning   memantine  10 mg Oral BID   multivitamin with minerals  1 tablet Oral Daily   pantoprazole  40 mg Oral Daily   potassium chloride  10 mEq Oral q morning   pyridostigmine  60 mg Oral TID   senna-docusate  1 tablet Oral BID   Continuous Infusions:  methocarbamol (ROBAXIN) IV       LOS: 2 days    Time spent: 10mn    PDomenic Polite MD [facility]   02/08/2021, 10:23 AM

## 2021-02-09 DIAGNOSIS — J449 Chronic obstructive pulmonary disease, unspecified: Secondary | ICD-10-CM

## 2021-02-09 DIAGNOSIS — S72401P Unspecified fracture of lower end of right femur, subsequent encounter for closed fracture with malunion: Secondary | ICD-10-CM

## 2021-02-09 DIAGNOSIS — F418 Other specified anxiety disorders: Secondary | ICD-10-CM | POA: Diagnosis not present

## 2021-02-09 DIAGNOSIS — I5032 Chronic diastolic (congestive) heart failure: Secondary | ICD-10-CM | POA: Diagnosis not present

## 2021-02-09 DIAGNOSIS — R9431 Abnormal electrocardiogram [ECG] [EKG]: Secondary | ICD-10-CM

## 2021-02-09 DIAGNOSIS — I951 Orthostatic hypotension: Secondary | ICD-10-CM

## 2021-02-09 DIAGNOSIS — K219 Gastro-esophageal reflux disease without esophagitis: Secondary | ICD-10-CM

## 2021-02-09 LAB — MAGNESIUM: Magnesium: 1.9 mg/dL (ref 1.7–2.4)

## 2021-02-09 LAB — CBC
HCT: 32.5 % — ABNORMAL LOW (ref 36.0–46.0)
Hemoglobin: 10.3 g/dL — ABNORMAL LOW (ref 12.0–15.0)
MCH: 29.2 pg (ref 26.0–34.0)
MCHC: 31.7 g/dL (ref 30.0–36.0)
MCV: 92.1 fL (ref 80.0–100.0)
Platelets: 125 10*3/uL — ABNORMAL LOW (ref 150–400)
RBC: 3.53 MIL/uL — ABNORMAL LOW (ref 3.87–5.11)
RDW: 13.7 % (ref 11.5–15.5)
WBC: 6.4 10*3/uL (ref 4.0–10.5)
nRBC: 0 % (ref 0.0–0.2)

## 2021-02-09 LAB — BASIC METABOLIC PANEL
Anion gap: 6 (ref 5–15)
BUN: 17 mg/dL (ref 8–23)
CO2: 31 mmol/L (ref 22–32)
Calcium: 8.8 mg/dL — ABNORMAL LOW (ref 8.9–10.3)
Chloride: 102 mmol/L (ref 98–111)
Creatinine, Ser: 0.62 mg/dL (ref 0.44–1.00)
GFR, Estimated: >60 mL/min (ref >60)
Glucose, Bld: 108 mg/dL — ABNORMAL HIGH (ref 70–99)
Potassium: 4.7 mmol/L (ref 3.5–5.1)
Sodium: 139 mmol/L (ref 135–145)

## 2021-02-09 MED ORDER — POLYETHYLENE GLYCOL 3350 17 G PO PACK
17.0000 g | PACK | Freq: Two times a day (BID) | ORAL | Status: DC
  Administered 2021-02-09 – 2021-02-10 (×2): 17 g via ORAL
  Filled 2021-02-09 (×2): qty 1

## 2021-02-09 MED ORDER — SORBITOL 70 % SOLN
30.0000 mL | Freq: Once | Status: AC
  Administered 2021-02-09: 30 mL via ORAL
  Filled 2021-02-09: qty 30

## 2021-02-09 MED ORDER — MIDODRINE HCL 5 MG PO TABS
5.0000 mg | ORAL_TABLET | Freq: Two times a day (BID) | ORAL | Status: DC
  Administered 2021-02-09 – 2021-02-11 (×4): 5 mg via ORAL
  Filled 2021-02-09 (×5): qty 1

## 2021-02-09 NOTE — Progress Notes (Signed)
    Subjective: Patient reports pain as marked. Actually speaking some today. Not everything makes sense but I can clearly hear her for the first time. Tolerating diet. Urinating. No CP, SOB. Still needs to work with PT/OT on mobilization. I know at baseline she doesn't walk but I'd still like her to do exercises in the bed to increase strength if possible.  Objective:   VITALS:   Vitals:   02/08/21 2158 02/09/21 0502 02/09/21 0800 02/09/21 1346  BP: (!) 116/58 (!) 105/56  (!) 120/58  Pulse: (!) 102 (!) 101  74  Resp: 18 18    Temp: 99.6 F (37.6 C) 100.2 F (37.9 C) 98.8 F (37.1 C) 98.2 F (36.8 C)  TempSrc:  Oral Axillary Oral  SpO2: 99% 90%  99%   CBC Latest Ref Rng & Units 02/09/2021 02/08/2021 02/07/2021  WBC 4.0 - 10.5 K/uL 6.4 7.2 7.1  Hemoglobin 12.0 - 15.0 g/dL 10.3(L) 10.7(L) 12.4  Hematocrit 36.0 - 46.0 % 32.5(L) 33.6(L) 40.5  Platelets 150 - 400 K/uL 125(L) 122(L) 152   BMP Latest Ref Rng & Units 02/09/2021 02/08/2021 02/07/2021  Glucose 70 - 99 mg/dL 108(H) 119(H) 145(H)  BUN 8 - 23 mg/dL 17 15 13   Creatinine 0.44 - 1.00 mg/dL 0.62 0.70 0.88  Sodium 135 - 145 mmol/L 139 138 140  Potassium 3.5 - 5.1 mmol/L 4.7 4.5 4.7  Chloride 98 - 111 mmol/L 102 100 103  CO2 22 - 32 mmol/L 31 29 28   Calcium 8.9 - 10.3 mg/dL 8.8(L) 8.6(L) 8.7(L)   Intake/Output      09/18 0701 09/19 0700 09/19 0701 09/20 0700   P.O. 120    I.V.     IV Piggyback     Total Intake 120    Urine     Blood     Total Output     Net +120            Physical Exam: General: NAD.  Sitting up in bed. Calm, speaking in English for first time Resp: No increased wob Cardio: regular rate and rhythm ABD soft Neurologically intact MSK Neurovascularly intact Sensation intact distally Intact pulses distally Dorsiflexion/Plantar flexion intact KI in place Incision: dressing C/D/I   Assessment: 2 Days Post-Op  S/P Procedure(s) (LRB): OPEN REDUCTION INTERNAL FIXATION (ORIF) DISTAL FEMUR  FRACTURE (Right) by Dr. Ernesta Amble. Percell Miller on 02/07/21  Principal Problem:   Closed fracture of right distal femur ([place]) Active Problems:   Dementia due to Alzheimer's disease ([place])   Orthostatic hypotension   COPD (chronic obstructive pulmonary disease) (HCC)   Chronic diastolic heart failure (HCC)   GERD (gastroesophageal reflux disease)   Anxiety with depression   Prolonged QT interval   Plan:  Advance diet Up with therapy as tolerated. Not mobile at baseline but don't want her to decline from not moving at all Incentive Spirometry Elevate and Apply ice to knee  Weightbearing: PWB 50%  RLE Insicional and dressing care: Dressings left intact until follow-up and Reinforce dressings as needed Orthopedic device(s):  KI Showering: Keep dressing dry VTE prophylaxis: Lovenox 40mg  qd  while inpatient, can switch to ASA 81mg  bid x 30 days upon d/c , SCDs, ambulation Pain control: Continue current regimen Follow - up plan: 2 weeks post-op in the office Contact information:  Edmonia Lynch MD, Aggie Moats PA-C  Dispo:  TBD. Likely needs to go to SNF     Britt Bottom, [place] Office [phone] 02/09/2021, 2:24 PM

## 2021-02-09 NOTE — Progress Notes (Signed)
PROGRESS NOTE    Sabrina Page  QQP:[id number] DOB: 15-Mar-1934 DOA: 02/06/2021 PCP: Cassandria Anger, MD (Confirm with patient/family/NH records and if not entered, this HAS to be entered at [facility] point of entry. "No PCP" if truly none.)   Chief Complaint  Patient presents with   Fall   Hip Pain    Brief Narrative:  Sabrina Page is a 87 y.o. female with medical history significant of anxiety, chronic diastolic heart failure, COPD, Alzheimer's dementia, GERD, orthostatic hypotension who was admitted and discharged last month due to COVID-19 and was brought to the ED from home, lives w/ spouse and caregiver following a mechanical fall, history was obtained from patient's daughter reported that she is able to stand but does not ambulate at baseline. -Denies any history of dizziness, no loss of consciousness -Work-up in the ED noted right distal femur shaft fracture with severe DJD. -Orthopedics was consulted and patient was admitted to [facility] [place]:   Principal Problem:   Closed fracture of right distal femur ([place]) Active Problems:   Dementia due to Alzheimer's disease (HCC)   Orthostatic hypotension   COPD (chronic obstructive pulmonary disease) (HCC)   Chronic diastolic heart failure (HCC)   GERD (gastroesophageal reflux disease)   Anxiety with depression   Prolonged QT interval  1 closed fracture of distal right femur -Patient seen in consultation by orthopedics and underwent ORIF 02/07/2021. -Continue Lovenox for DVT prophylaxis and transition to aspirin 81 mg twice daily x30 days on discharge. -Continue current pain management. -Concerned that patient may not be able to do rehab as at baseline usually just transfers. -TOC consulted for SNF placement. -Outpatient follow-up with orthopedics.  2.  Orthostatic hypotension from dysautonomia related to idiopathic peripheral neuropathy -Patient with some complaints of dizziness today. -Continue  Florinef and Mestinon. -Repeat EKG with resolution of QTC prolongation and as such we will resume home regimen of midodrine. -Patient noted to have history of recurrent syncope secondary to orthostasis. -Follow.  3.  QTC prolongation -Patient's magnesium replaced and magnesium currently at 1.9. -Repeat EKG with resolution of QTC prolongation. -Resume home regimen midodrine. -Continue to hold Abilify and if repeat EKG tomorrow with continued resolution of QTC prolongation we will resume Abilify.  4.  Dementia due to Alzheimer's disease -Memantine 10 mg twice daily.  5.  Depression and anxiety -Abilify held secondary to QTC prolongation. -Repeat EKG today with resolution of QTC prolongation -We will repeat EKG tomorrow and if QTC prolongation remains resolved we will resume Abilify. -Continue Prozac.  6.  COPD -Stable. -Continue bronchodilators as needed.  7.  Chronic systolic heart failure -Stable. -Euvolemic on examination.  8.  GERD -PPI.  9.  Constipation -Daughter states patient has not had a bowel movement in several days. -Placed on MiraLAX twice daily. -Continue Senokot-S twice daily -Sorbitol p.o. x1.  10.  Coughing/choking with oral intake -Daughter concerned that patient may be aspirating as she states patient with cough and with oral intake. -SLP evaluation. -Continue current diet until patient assessed by SLP.   DVT prophylaxis: Lovenox Code Status: Full Family Communication: Updated patient and daughter at bedside. Disposition:   Status is: Inpatient  Remains inpatient appropriate because:Inpatient level of care appropriate due to severity of illness  Dispo: The patient is from: Home              Anticipated d/c is to: SNF              Patient currently is not  medically stable to d/c.   Difficult to place patient No       Consultants:  Orthopedics: Dr. Edmonia Lynch 02/07/2021  Procedures:  Plain films of the right knee 02/07/2022 Chest  x-ray 02/06/2022 ORIF distal right femur per Dr. Percell Miller 02/07/2021  Antimicrobials:  None   Subjective: Patient laying in bed.  Daughter at bedside who translated to patient.  Patient with some complaints of constipation.  Patient also with complaints of abdominal pain when she takes a deep breath.  No substernal chest pain.  Per daughter patient able to stand up and transfer.  Per daughter patient with some coughing episodes with oral intake.  Objective: Vitals:   02/08/21 0630 02/08/21 1337 02/08/21 2158 02/09/21 0502  BP: (!) 105/53 (!) 124/49 (!) 116/58 (!) 105/56  Pulse: 72 81 (!) 102 (!) 101  Resp: 20 16 18 18   Temp: 98.7 F (37.1 C) 98.5 F (36.9 C) 99.6 F (37.6 C) 100.2 F (37.9 C)  TempSrc:  Oral  Oral  SpO2: 98% 99% 99% 90%    Intake/Output Summary (Last 24 hours) at 02/09/2021 1246 Last data filed at 02/08/2021 1351 Gross per 24 hour  Intake 120 ml  Output --  Net 120 ml   There were no vitals filed for this visit.  Examination:  General exam: Appears calm and comfortable  Respiratory system: Clear to auscultation anterior lung fields.Marland Kitchen Respiratory effort normal. Cardiovascular system: S1 & S2 heard, RRR. No JVD, murmurs, rubs, gallops or clicks. No pedal edema. Gastrointestinal system: Abdomen is nondistended, soft and nontender. No organomegaly or masses felt. Normal bowel sounds heard. Central nervous system: Alert and oriented. No focal neurological deficits. Extremities: Symmetric 5 x 5 power. Skin: No rashes, lesions or ulcers Psychiatry: Judgement and insight appear normal. Mood & affect appropriate.     Data Reviewed: I have personally reviewed following labs and imaging studies  CBC: Recent Labs  Lab 02/06/21 2210 02/07/21 0430 02/08/21 0536 02/09/21 0437  WBC 8.6 7.1 7.2 6.4  NEUTROABS 6.6  --   --   --   HGB 13.7 12.4 10.7* 10.3*  HCT 44.5 40.5 33.6* 32.5*  MCV 94.1 93.8 91.3 92.1  PLT 145* 152 122* 125*    Basic Metabolic  Panel: Recent Labs  Lab 02/06/21 2210 02/07/21 0430 02/08/21 0536 02/09/21 0437  NA 144 140 138 139  K 4.3 4.7 4.5 4.7  CL 107 103 100 102  CO2 31 28 29 31   GLUCOSE 120* 145* 119* 108*  BUN 13 13 15 17   CREATININE 0.88 0.88 0.70 0.62  CALCIUM 9.2 8.7* 8.6* 8.8*  MG  --   --   --  1.9    GFR: CrCl cannot be calculated (Unknown ideal weight.).  Liver Function Tests: Recent Labs  Lab 02/07/21 0430  AST 33  ALT 25  ALKPHOS 70  BILITOT 0.8  PROT 6.1*  ALBUMIN 3.2*    CBG: No results for input(s): GLUCAP in the last 168 hours.   Recent Results (from the past 240 hour(s))  Resp Panel by RT-PCR (Flu A&B, Covid) Nasopharyngeal Swab     Status: None   Collection Time: 02/06/21 10:32 PM   Specimen: Nasopharyngeal Swab; Nasopharyngeal(NP) swabs in vial transport medium  Result Value Ref Range Status   SARS Coronavirus 2 by RT PCR NEGATIVE NEGATIVE Final    Comment: (NOTE) SARS-CoV-2 target nucleic acids are NOT DETECTED.  The SARS-CoV-2 RNA is generally detectable in upper respiratory specimens during the acute phase of infection.  The lowest concentration of SARS-CoV-2 viral copies this assay can detect is 138 copies/mL. A negative result does not preclude SARS-Cov-2 infection and should not be used as the sole basis for treatment or other patient management decisions. A negative result may occur with  improper specimen collection/handling, submission of specimen other than nasopharyngeal swab, presence of viral mutation(s) within the areas targeted by this assay, and inadequate number of viral copies(<138 copies/mL). A negative result must be combined with clinical observations, patient history, and epidemiological information. The expected result is Negative.  Fact Sheet for Patients:  EntrepreneurPulse.com.au  Fact Sheet for Healthcare Providers:  IncredibleEmployment.be  This test is no t yet approved or cleared by the [place] FDA and  has been authorized for detection and/or diagnosis of SARS-CoV-2 by FDA under an Emergency Use Authorization (EUA). This EUA will remain  in effect (meaning this test can be used) for the duration of the COVID-19 declaration under Section 564(b)(1) of the Act, 21 U.S.C.section 360bbb-3(b)(1), unless the authorization is terminated  or revoked sooner.       Influenza A by PCR NEGATIVE NEGATIVE Final   Influenza B by PCR NEGATIVE NEGATIVE Final    Comment: (NOTE) The Xpert Xpress SARS-CoV-2/FLU/RSV plus assay is intended as an aid in the diagnosis of influenza from Nasopharyngeal swab specimens and should not be used as a sole basis for treatment. Nasal washings and aspirates are unacceptable for Xpert Xpress SARS-CoV-2/FLU/RSV testing.  Fact Sheet for Patients: EntrepreneurPulse.com.au  Fact Sheet for Healthcare Providers: IncredibleEmployment.be  This test is not yet approved or cleared by the [place] FDA and has been authorized for detection and/or diagnosis of SARS-CoV-2 by FDA under an Emergency Use Authorization (EUA). This EUA will remain in effect (meaning this test can be used) for the duration of the COVID-19 declaration under Section 564(b)(1) of the Act, 21 U.S.C. section 360bbb-3(b)(1), unless the authorization is terminated or revoked.  Performed at [facility], [place] [address], [place], [place] [address]          Radiology Studies: No results found.      Scheduled Meds:  Chlorhexidine Gluconate Cloth  6 each Topical Daily   cholecalciferol  125 mcg Oral Daily   docusate sodium  100 mg Oral BID   enoxaparin (LOVENOX) injection  40 mg Subcutaneous Q24H   feeding supplement  237 mL Oral BID BM   fludrocortisone  0.1 mg Oral Daily   FLUoxetine  40 mg Oral Daily   loratadine  10 mg Oral q morning   memantine  10 mg Oral BID   midodrine  5 mg Oral BID WC   multivitamin with  minerals  1 tablet Oral Daily   pantoprazole  40 mg Oral Daily   polyethylene glycol  17 g Oral BID   potassium chloride  10 mEq Oral q morning   pyridostigmine  60 mg Oral TID   senna-docusate  1 tablet Oral BID   Continuous Infusions:  methocarbamol (ROBAXIN) IV       LOS: 3 days    Time spent: 35 minutes    Irine Seal, MD [facility]   To contact the attending provider between 7A-7P or the covering provider during after hours 7P-7A, please log into the web site [email or web address] and access using universal [place] password for that web site. If you do not have the password, please call the hospital operator.  02/09/2021, 12:46 PM

## 2021-02-09 NOTE — Evaluation (Signed)
Clinical/Bedside Swallow Evaluation Patient Details  Name: Sabrina Page MRN: [id number] Date of Birth: Jun 20, 1933  Today's Date: 02/09/2021 Time: SLP Start Time (ACUTE ONLY): 74 SLP Stop Time (ACUTE ONLY): 1445 SLP Time Calculation (min) (ACUTE ONLY): 30 min  Past Medical History:  Past Medical History:  Diagnosis Date   Anxiety with depression 02/06/2021   Chronic diastolic heart failure ([place]) 02/06/2021   COPD (chronic obstructive pulmonary disease) ([place]) 02/06/2021   COVID-19 virus infection 02/06/2021   Dementia due to Alzheimer's disease ([place]) 02/06/2021   GERD (gastroesophageal reflux disease) 02/06/2021   Orthostatic hypotension 02/06/2021   Past Surgical History:  Past Surgical History:  Procedure Laterality Date   BREAST SURGERY     due to mastitis   CHOLECYSTECTOMY     HIP ARTHROPLASTY Right    TOTAL KNEE ARTHROPLASTY Left    HPI:  Patient is a 87 year old female who presented to the hospital after a fall at home. Patient was found to have acute fracture of right distal femoral shaft with severe DJD and small effusion. PMH: chronic diastolic CHF, COPD, Alzheimer's dementia, GERD, orthostatic    Assessment / Plan / Recommendation  Clinical Impression  Patient presents with what appears to be a mild cognitive-based dysphagia. No overt s/s aspiration or penetration observed with straw sips thin liquids or bites of dysphagia 3 solids. Patient was able to feed self from meal tray using spoon and after initial assistance from daughter and SLP in helping to put food onto spoon, patient then started to use left hand to push food onto spoon. Mastication was slow but no significant oral residuals observed post swallows. Patient demonstrated ability to pick up cup and drink liquids via straw sips without assistance. Patient's daughter reported that she has noticed a decline in patient's overall PO intake and whereas she had been feeding herself, recently caregivers have had to feed  her. SLP is recommending to continue on Dys 3 solids, thin liquids diet at this time and will follow patient briefly to ensure she is tolerating without difficulty.  SLP deferring SLE as patient with h/o Alzheimer's dementia and appears at her baseline per daughter [name]) SLP Visit Diagnosis: Dysphagia, unspecified (R13.10)    Aspiration Risk  Mild aspiration risk;No limitations    Diet Recommendation Dysphagia 3 (Mech soft);Thin liquid   Liquid Administration via: Cup;Straw Medication Administration: Whole meds with puree Supervision: Patient able to self feed;Full supervision/cueing for compensatory strategies;Staff to assist with self feeding Compensations: Minimize environmental distractions;Small sips/bites;Slow rate Postural Changes: Seated upright at 90 degrees    Other  Recommendations Oral Care Recommendations: Oral care BID;Staff/trained caregiver to provide oral care    Recommendations for follow up therapy are one component of a multi-disciplinary discharge planning process, led by the attending physician.  Recommendations may be updated based on patient status, additional functional criteria and insurance authorization.  Follow up Recommendations Other (comment) (TBD)      Frequency and Duration min 1 x/week  1 week       Prognosis Prognosis for Safe Diet Advancement: Good Barriers to Reach Goals: Cognitive deficits Barriers/Prognosis Comment: h/o Alzheimer's dementia      Swallow Study   General Date of Onset: 02/06/21 HPI: Patient is a 87 year old female who presented to the hospital after a fall at home. Patient was found to have acute fracture of right distal femoral shaft with severe DJD and small effusion. PMH: chronic diastolic CHF, COPD, Alzheimer's dementia, GERD, orthostatic Type of Study: Bedside Swallow Evaluation  Previous Swallow Assessment: None found Diet Prior to this Study: Dysphagia 3 (soft);Thin liquids Temperature Spikes Noted:  No Respiratory Status: Nasal cannula History of Recent Intubation: No Behavior/Cognition: Alert;Cooperative;Pleasant mood Oral Cavity Assessment: Within Functional Limits Oral Care Completed by SLP: No Oral Cavity - Dentition: Dentures, top;Dentures, bottom Vision: Functional for self-feeding Self-Feeding Abilities: Able to feed self;Needs set up;Needs assist Patient Positioning: Upright in bed Baseline Vocal Quality: Normal Volitional Cough: Cognitively unable to elicit Volitional Swallow: Unable to elicit    Oral/Motor/Sensory Function Overall Oral Motor/Sensory Function: Within functional limits   Ice Chips     Thin Liquid Thin Liquid: Within functional limits Presentation: Straw Other Comments: No overt s/s aspiration or penetration observed    Nectar Thick     Honey Thick     Puree Puree: Not tested   Solid     Solid: Impaired Oral Phase Impairments: Impaired mastication Oral Phase Functional Implications: Prolonged oral transit      Sonia Baller, MA, CCC-SLP Speech Therapy

## 2021-02-10 DIAGNOSIS — I5032 Chronic diastolic (congestive) heart failure: Secondary | ICD-10-CM | POA: Diagnosis not present

## 2021-02-10 DIAGNOSIS — F418 Other specified anxiety disorders: Secondary | ICD-10-CM | POA: Diagnosis not present

## 2021-02-10 DIAGNOSIS — S72401P Unspecified fracture of lower end of right femur, subsequent encounter for closed fracture with malunion: Secondary | ICD-10-CM | POA: Diagnosis not present

## 2021-02-10 DIAGNOSIS — J449 Chronic obstructive pulmonary disease, unspecified: Secondary | ICD-10-CM | POA: Diagnosis not present

## 2021-02-10 LAB — MAGNESIUM: Magnesium: 1.9 mg/dL (ref 1.7–2.4)

## 2021-02-10 LAB — BASIC METABOLIC PANEL
Anion gap: 5 (ref 5–15)
BUN: 14 mg/dL (ref 8–23)
CO2: 31 mmol/L (ref 22–32)
Calcium: 8.7 mg/dL — ABNORMAL LOW (ref 8.9–10.3)
Chloride: 98 mmol/L (ref 98–111)
Creatinine, Ser: 0.69 mg/dL (ref 0.44–1.00)
GFR, Estimated: >60 mL/min (ref >60)
Glucose, Bld: 96 mg/dL (ref 70–99)
Potassium: 4.9 mmol/L (ref 3.5–5.1)
Sodium: 134 mmol/L — ABNORMAL LOW (ref 135–145)

## 2021-02-10 LAB — CBC
HCT: 32.3 % — ABNORMAL LOW (ref 36.0–46.0)
Hemoglobin: 10.6 g/dL — ABNORMAL LOW (ref 12.0–15.0)
MCH: 29.4 pg (ref 26.0–34.0)
MCHC: 32.8 g/dL (ref 30.0–36.0)
MCV: 89.7 fL (ref 80.0–100.0)
Platelets: 157 10*3/uL (ref 150–400)
RBC: 3.6 MIL/uL — ABNORMAL LOW (ref 3.87–5.11)
RDW: 13.5 % (ref 11.5–15.5)
WBC: 6 10*3/uL (ref 4.0–10.5)
nRBC: 0 % (ref 0.0–0.2)

## 2021-02-10 LAB — URINALYSIS, ROUTINE W REFLEX MICROSCOPIC
Bilirubin Urine: NEGATIVE
Glucose, UA: NEGATIVE mg/dL
Hgb urine dipstick: NEGATIVE
Ketones, ur: NEGATIVE mg/dL
Leukocytes,Ua: NEGATIVE
Nitrite: NEGATIVE
Protein, ur: NEGATIVE mg/dL
Specific Gravity, Urine: 1.01 (ref 1.005–1.030)
pH: 7.5 (ref 5.0–8.0)

## 2021-02-10 LAB — SARS CORONAVIRUS 2 (TAT 6-24 HRS): SARS Coronavirus 2: NEGATIVE

## 2021-02-10 MED ORDER — LACTULOSE 10 GM/15ML PO SOLN
20.0000 g | Freq: Two times a day (BID) | ORAL | Status: DC
  Administered 2021-02-10 – 2021-02-11 (×2): 20 g via ORAL
  Filled 2021-02-10 (×2): qty 30

## 2021-02-10 MED ORDER — SODIUM CHLORIDE 0.9 % IV SOLN: INTRAVENOUS | Status: AC

## 2021-02-10 MED ORDER — FLEET ENEMA 7-19 GM/118ML RE ENEM
1.0000 | ENEMA | Freq: Once | RECTAL | Status: AC
  Administered 2021-02-10: 1 via RECTAL
  Filled 2021-02-10: qty 1

## 2021-02-10 MED ORDER — ARIPIPRAZOLE 2 MG PO TABS
2.0000 mg | ORAL_TABLET | Freq: Every day | ORAL | Status: DC
  Administered 2021-02-10 – 2021-02-11 (×2): 2 mg via ORAL
  Filled 2021-02-10 (×2): qty 1

## 2021-02-10 NOTE — NC FL2 (Signed)
[place] LEVEL OF CARE SCREENING TOOL     IDENTIFICATION  Patient Name: Sabrina Page Birthdate: 18-Jul-1933 Sex: female Admission Date (Current Location): 02/06/2021  [facility] and [place] Number:  Herbalist and Address:  [facility],  [place], [place]      Provider Number: [id number]  Attending Physician Name and Address:  Eugenie Filler, MD  Relative Name and Phone Number:  [name] (Daughter)   [phone] (    Current Level of Care: Hospital Recommended Level of Care: [place] Prior Approval Number:    Date Approved/Denied:   PASRR Number:    Discharge Plan: SNF    Current Diagnoses: Patient Active Problem List   Diagnosis Date Noted   Prolonged QT interval 02/07/2021   Closed fracture of right distal femur ([place]) 02/06/2021   Dementia due to Alzheimer's disease ([place]) 02/06/2021   Orthostatic hypotension 02/06/2021   COPD (chronic obstructive pulmonary disease) ([place]) 02/06/2021   Chronic diastolic heart failure ([place]) 02/06/2021   GERD (gastroesophageal reflux disease) 02/06/2021   Anxiety with depression 02/06/2021   COVID-19 virus infection 02/06/2021    Orientation RESPIRATION BLADDER Height & Weight     Self, Situation  O2 (2L/[place]) Incontinent, External catheter Weight:   Height:     BEHAVIORAL SYMPTOMS/MOOD NEUROLOGICAL BOWEL NUTRITION STATUS      Incontinent Diet (see d/c summary)  AMBULATORY STATUS COMMUNICATION OF NEEDS Skin   Extensive Assist Verbally Surgical wounds (R Hip)                       Personal Care Assistance Level of Assistance  Bathing, Feeding, Dressing Bathing Assistance: Maximum assistance Feeding assistance: Independent Dressing Assistance: Maximum assistance     Functional Limitations Info  Sight, Hearing, Speech Sight Info: Adequate Hearing Info: Adequate Speech Info: Adequate    SPECIAL CARE FACTORS FREQUENCY  PT (By  licensed PT), OT (By licensed OT)     PT Frequency: 5X/W OT Frequency: 5X/W            Contractures Contractures Info: Not present    Additional Factors Info  Code Status, Allergies Code Status Info: DNR Allergies Info: NKA           Current Medications (02/10/2021):  This is the current hospital active medication list Current Facility-Administered Medications  Medication Dose Route Frequency Provider Last Rate Last Admin   acetaminophen (TYLENOL) tablet 325-650 mg  325-650 mg Oral Q6H PRN Britt Bottom, PA-C   650 mg at 02/09/21 0504   alum & mag hydroxide-simeth (MAALOX/MYLANTA) 200-200-20 MG/5ML suspension 30 mL  30 mL Oral Q4H PRN Gawne, Meghan M, PA-C       bisacodyl (DULCOLAX) suppository 10 mg  10 mg Rectal Daily PRN Gawne, Meghan M, PA-C       Chlorhexidine Gluconate Cloth 2 % PADS 6 each  6 each Topical Daily Renette Butters, MD   6 each at 02/09/21 0954   cholecalciferol (VITAMIN D) tablet 5,000 Units  125 mcg Oral Daily Britt Bottom, PA-C   5,000 Units at 02/09/21 5732   docusate sodium (COLACE) capsule 100 mg  100 mg Oral BID Aggie Moats M, PA-C   100 mg at 02/09/21 2105   enoxaparin (LOVENOX) injection 40 mg  40 mg Subcutaneous Q24H Aggie Moats M, PA-C   40 mg at 02/10/21 0510   feeding supplement (ENSURE ENLIVE / ENSURE PLUS) liquid 237 mL  237 mL Oral BID  BM Domenic Polite, MD   237 mL at 02/09/21 1415   fludrocortisone (FLORINEF) tablet 0.1 mg  0.1 mg Oral Daily Aggie Moats M, PA-C   0.1 mg at 02/09/21 0953   FLUoxetine (PROZAC) capsule 40 mg  40 mg Oral Daily Aggie Moats M, PA-C   40 mg at 02/09/21 0954   HYDROcodone-acetaminophen (NORCO) 7.5-325 MG per tablet 1-2 tablet  1-2 tablet Oral Q4H PRN Aggie Moats M, PA-C       HYDROcodone-acetaminophen (NORCO/VICODIN) 5-325 MG per tablet 1-2 tablet  1-2 tablet Oral Q4H PRN Britt Bottom, PA-C   1 tablet at 02/10/21 0517   loratadine (CLARITIN) tablet 10 mg  10 mg Oral q morning Aggie Moats M, PA-C    10 mg at 02/09/21 0954   memantine (NAMENDA) tablet 10 mg  10 mg Oral BID Aggie Moats M, PA-C   10 mg at 02/09/21 2105   menthol-cetylpyridinium (CEPACOL) lozenge 3 mg  1 lozenge Oral PRN Aggie Moats M, PA-C       Or   phenol (CHLORASEPTIC) mouth spray 1 spray  1 spray Mouth/Throat PRN Gawne, Trinda Pascal M, PA-C       methocarbamol (ROBAXIN) tablet 500 mg  500 mg Oral Q6H PRN Gawne, Meghan M, PA-C       Or   methocarbamol (ROBAXIN) 500 mg in dextrose 5 % 50 mL IVPB  500 mg Intravenous Q6H PRN Gawne, Meghan M, PA-C       midodrine (PROAMATINE) tablet 5 mg  5 mg Oral BID WC Eugenie Filler, MD   5 mg at 02/09/21 2105   multivitamin with minerals tablet 1 tablet  1 tablet Oral Daily Domenic Polite, MD   1 tablet at 02/09/21 0953   pantoprazole (PROTONIX) EC tablet 40 mg  40 mg Oral Daily Aggie Moats M, PA-C   40 mg at 02/09/21 0953   polyethylene glycol (MIRALAX / GLYCOLAX) packet 17 g  17 g Oral Daily PRN Britt Bottom, PA-C   17 g at 02/09/21 1133   polyethylene glycol (MIRALAX / GLYCOLAX) packet 17 g  17 g Oral BID Eugenie Filler, MD   17 g at 02/09/21 2258   potassium chloride (KLOR-CON) CR tablet 10 mEq  10 mEq Oral q morning Aggie Moats M, PA-C   10 mEq at 02/09/21 1133   prochlorperazine (COMPAZINE) injection 5 mg  5 mg Intravenous Q4H PRN Gawne, Meghan M, PA-C       pyridostigmine (MESTINON) tablet 60 mg  60 mg Oral TID Gawne, Meghan M, PA-C   60 mg at 02/09/21 2105   senna-docusate (Senokot-S) tablet 1 tablet  1 tablet Oral BID Aggie Moats M, PA-C   1 tablet at 02/09/21 2105   sodium phosphate (FLEET) 7-19 GM/118ML enema 1 enema  1 enema Rectal Once Eugenie Filler, MD         Discharge Medications: Please see discharge summary for a list of discharge medications.  Relevant Imaging Results:  Relevant Lab Results:   Additional [place], LCSW

## 2021-02-10 NOTE — Progress Notes (Signed)
Physical Therapy Treatment Patient Details Name: Sabrina Page MRN: [id number] DOB: Oct 14, 1933 Today's Date: 02/10/2021   History of Present Illness Patient is a 87 year old female who presented to the hospital on 02/06/21 after a fall at home. patient was found to have acute fracture of right distal femoral shaft with severe DJD and small effusion. Pt s/p ORIF on 02/07/21 and is PWB 50% post op with KI while weight bearing. PMH: chronic diastolic CHF, COPD, alzheimer's, GERD, orthostatic    PT Comments    Pt was able to sit EOB for 45 minutes today working on tolerance of upright and hand over hand self feeding (she used her left hand primarily), continues to report dizziness, but did not get worse in sitting.  Pt's daughter was assisting in her care and mobility throughout the session and wants to take the patient home at discharge as she has 24/7 caregivers and a total lift.  We talked through how she would get her to the MD (lift to WC, WC via SCAT to MD appointment).   Recommendations for follow up therapy are one component of a multi-disciplinary discharge planning process, led by the attending physician.  Recommendations may be updated based on patient status, additional functional criteria and insurance authorization.  Follow Up Recommendations  SNF (remains SNF appropriate, but sounds like dtr is preparing to take pt home)     Equipment Recommendations  Wheelchair (measurements PT);Wheelchair cushion (measurements PT);Other (comment) (20x20 WC with elevating leg rests, ambulance transport home.)    Recommendations for Other Services       Precautions / Restrictions Precautions Precautions: Fall Precaution Comments: continues to be dizzy in sitting, assess O2 sats for O2 sat qualification each day (daughter is hopeful to qualify because she would like it at home). Restrictions RLE Weight Bearing: Partial weight bearing RLE Partial Weight Bearing Percentage or Pounds: 50      Mobility  Bed Mobility Overal bed mobility: Needs Assistance Bed Mobility: Rolling;Supine to Sit;Sit to Supine Rolling: Total assist   Supine to sit: Max assist;+2 for physical assistance;HOB elevated Sit to supine: Max assist;+2 for physical assistance;HOB elevated   General bed mobility comments: Two person max assist to come to sitting EOB with HOB maximally elevated.  Assist at trunk, with bil legs and to scoot hips in sitting.    Transfers                 General transfer comment: NT-not able to in [place].  uses [place] standing frame at home to stand and transfer at baseline.  Ambulation/Gait                 Stairs             Wheelchair Mobility    Modified Rankin (Stroke Patients Only)       Balance Overall balance assessment: Needs assistance Sitting-balance support: Feet supported;Bilateral upper extremity supported Sitting balance-Leahy Scale: Poor Sitting balance - Comments: up to max assist with fatigue and as light as min assist, but unable to sit without support as pt slowly falls to the right when unsupported after ~15-20 seconds.  Dtr reports R lateral lean is her normal at baseline. Postural control: Right lateral lean;Posterior lean                                  Cognition Arousal/Alertness: Awake/alert Behavior During Therapy: WFL for tasks assessed/performed;Flat affect Overall Cognitive Status:  History of cognitive impairments - at baseline                                 General Comments: Daughter in room translating today, pt responsive to daughter, speaks very little [place], per daughter is hallucinating which is not her baseline.  Very sweet patient.      Exercises      General Comments General comments (skin integrity, edema, etc.): removed KI as the hard ends were causing some pressure on her posterior gastroc.  Order for [place] states "when weightbearing", so when we were done EOB I removed it  in the bed and made her comfortable with ice packs, heel floating turned to her right for pressure relief on her bottom. O2 taken off at the beginning of the session and pt dropped lowest 89% on RA during mobility.      Pertinent Vitals/Pain Pain Assessment: Faces Faces Pain Scale: Hurts even more Pain Location: R knee Pain Descriptors / Indicators: Grimacing;Guarding Pain Intervention(s): Limited activity within patient's tolerance;Monitored during session;Repositioned;Ice applied    Home Living                      Prior Function            PT Goals (current goals can now be found in the care plan section) Acute Rehab PT Goals Patient Stated Goal: daughter in room expressing that she would like to take her mom home with 24/7 caregivers. Progress towards PT goals: Progressing toward goals    Frequency    Min 3X/week (increased because daughter plans to take her home)      PT Plan Current plan remains appropriate    Co-evaluation              AM-PAC PT "6 Clicks" Mobility   Outcome Measure  Help needed turning from your back to your side while in a flat bed without using bedrails?: Total Help needed moving from lying on your back to sitting on the side of a flat bed without using bedrails?: Total Help needed moving to and from a bed to a chair (including a wheelchair)?: Total Help needed standing up from a chair using your arms (e.g., wheelchair or bedside chair)?: Total Help needed to walk in hospital room?: Total Help needed climbing 3-5 steps with a railing? : Total 6 Click Score: 6    End of Session Equipment Utilized During Treatment: Oxygen Activity Tolerance: Patient limited by fatigue;Patient limited by pain Patient left: in bed;with call bell/phone within reach;with bed alarm set   PT Visit Diagnosis: Muscle weakness (generalized) (M62.81);Pain;Difficulty in walking, not elsewhere classified (R26.2) Pain - Right/Left: Right Pain - part of  body: Knee     Time: 2248-2500 PT Time Calculation (min) (ACUTE ONLY): 61 min  Charges:  $Therapeutic Activity: 53-67 mins                    Verdene Lennert, PT, DPT  Acute Rehabilitation Ortho Tech Supervisor [phone] pager [phone]) [phone] office

## 2021-02-10 NOTE — Progress Notes (Signed)
SATURATION QUALIFICATIONS: (This note is used to comply with regulatory documentation for home oxygen)  Patient Saturations on Room Air at Rest = 94%  Patient Saturations on Room Air while Ambulating/mobilizing = 89%  Patient Saturations on 2 Liters of oxygen while Ambulating/mobilizing = 94%  Please briefly explain why patient needs home oxygen:  Verdene Lennert, PT, DPT  Acute Rehabilitation Ortho Tech Supervisor [phone] pager [phone]) [phone] office

## 2021-02-10 NOTE — Progress Notes (Signed)
PROGRESS NOTE    Sabrina Page  HYW:[id number] DOB: 06-May-1934 DOA: 02/06/2021 PCP: Cassandria Anger, MD (Confirm with patient/family/NH records and if not entered, this HAS to be entered at [facility] point of entry. "No PCP" if truly none.)   Chief Complaint  Patient presents with   Fall   Hip Pain    Brief Narrative:  Sabrina Page is a 87 y.o. female with medical history significant of anxiety, chronic diastolic heart failure, COPD, Alzheimer's dementia, GERD, orthostatic hypotension who was admitted and discharged last month due to COVID-19 and was brought to the ED from home, lives w/ spouse and caregiver following a mechanical fall, history was obtained from patient's daughter reported that she is able to stand but does not ambulate at baseline. -Denies any history of dizziness, no loss of consciousness -Work-up in the ED noted right distal femur shaft fracture with severe DJD. -Orthopedics was consulted and patient was admitted to [facility]:   Principal Problem:   Closed fracture of right distal femur ([place]) Active Problems:   Dementia due to Alzheimer's disease (HCC)   Orthostatic hypotension   COPD (chronic obstructive pulmonary disease) (HCC)   Chronic diastolic heart failure (HCC)   GERD (gastroesophageal reflux disease)   Anxiety with depression   Prolonged QT interval  1 closed fracture of distal right femur -Patient seen in consultation by orthopedics and underwent ORIF 02/07/2021. -Continue Lovenox for DVT prophylaxis and transition to aspirin 81 mg twice daily x30 days on discharge. -Continue current pain management. -Concerned that patient may not be able to do rehab as at baseline usually just transfers. -TOC consulted for SNF placement. -Outpatient follow-up with orthopedics.  2.  Orthostatic hypotension from dysautonomia related to idiopathic peripheral neuropathy -Patient with some complaints of dizziness yesterday which is  improving.  -Continue Florinef and Mestinon and midodrine which was resumed 02/09/2021 with resolution of QTC prolongation.. -Patient noted to have history of recurrent syncope secondary to orthostasis. -Follow.  3.  QTC prolongation -Patient's magnesium replaced and magnesium currently at 1.9. -Repeat EKG with resolution of QTC prolongation. -Continue midodrine.   -We will resume Abilify.   -Repeat EKG in the morning.  4.  Dementia due to Alzheimer's disease -Continue memantine 10 mg twice daily.  5.  Depression and anxiety -Abilify held secondary to QTC prolongation. -Repeat EKG over the past 2 days with resolution of QTC prolongation.  -Continue Prozac.   -Resume home regimen Abilify and repeat EKG in the morning.   6.  COPD -Stable. -Continue bronchodilators as needed.  7.  Chronic systolic heart failure -Stable. -Euvolemic on examination.  8.  GERD -Continue PPI  9.  Constipation -Daughter states patient has not had a bowel movement in several days. -Placed on MiraLAX twice daily and Senokot-S twice daily and given a dose of sorbitol with no bowel movements. -On Dulcolax suppositories as needed. -Fleets enema x1. -Discontinue MiraLAX and placed on lactulose twice daily. -Supportive care.  10.  Coughing/choking with oral intake -Daughter concerned that patient may be aspirating as she states patient with cough and with oral intake. -Patient assessed by speech therapy and patient placed on dysphagia 3 diet with thin liquids.   -Outpatient follow-up with SLP.   DVT prophylaxis: Lovenox Code Status: Full Family Communication: Updated patient and daughter at bedside. Disposition:   Status is: Inpatient  Remains inpatient appropriate because:Inpatient level of care appropriate due to severity of illness  Dispo: The patient is from: Home  Anticipated d/c is to: SNF versus home with home health therapies.              Patient currently is not medically  stable to d/c.   Difficult to place patient No       Consultants:  Orthopedics: Dr. Edmonia Lynch 02/07/2021  Procedures:  Plain films of the right knee 02/07/2022 Chest x-ray 02/06/2022 ORIF distal right femur per Dr. Percell Miller 02/07/2021  Antimicrobials:  None   Subjective: Patient sitting up on the side of the bed working with physical therapy.  Denies any chest pain.  No shortness of breath.  States some improvement with dizziness.  Daughter concerned patient may have a UTI she states patient is having some intermittent hallucinations.   Objective: Vitals:   02/09/21 1730 02/09/21 1921 02/10/21 0511 02/10/21 1137  BP: (!) 141/69 (!) 101/58 (!) 119/58 (!) 111/46  Pulse:  87 85 84  Resp:  18 18 18   Temp:  98 F (36.7 C) 98.2 F (36.8 C)   TempSrc:  Oral Oral   SpO2:  99% 100% 100%    Intake/Output Summary (Last 24 hours) at 02/10/2021 1240 Last data filed at 02/10/2021 1209 Gross per 24 hour  Intake 270 ml  Output 651 ml  Net -381 ml    There were no vitals filed for this visit.  Examination:  General exam: NAD Respiratory system: Lungs auscultation bilaterally.  No wheezes, no crackles, no rhonchi.  Normal respiratory effort. Cardiovascular system: Regular rate rhythm no murmurs rubs or gallops.  No JVD.  No lower extremity edema.  Gastrointestinal system: Abdomen is soft, nontender, nondistended, positive bowel sounds.  No rebound.  No guarding.  Central nervous system: Alert and oriented. No focal neurological deficits. Extremities: Right lower extremity in knee immobilizer.  Symmetric 5 x 5 power. Skin: No rashes, lesions or ulcers Psychiatry: Judgement and insight appear poor to fair. Mood & affect appropriate.     Data Reviewed: I have personally reviewed following labs and imaging studies  CBC: Recent Labs  Lab 02/06/21 2210 02/07/21 0430 02/08/21 0536 02/09/21 0437 02/10/21 0509  WBC 8.6 7.1 7.2 6.4 6.0  NEUTROABS 6.6  --   --   --   --   HGB  13.7 12.4 10.7* 10.3* 10.6*  HCT 44.5 40.5 33.6* 32.5* 32.3*  MCV 94.1 93.8 91.3 92.1 89.7  PLT 145* 152 122* 125* 157     Basic Metabolic Panel: Recent Labs  Lab 02/06/21 2210 02/07/21 0430 02/08/21 0536 02/09/21 0437 02/10/21 0509  NA 144 140 138 139 134*  K 4.3 4.7 4.5 4.7 4.9  CL 107 103 100 102 98  CO2 31 28 29 31 31   GLUCOSE 120* 145* 119* 108* 96  BUN 13 13 15 17 14   CREATININE 0.88 0.88 0.70 0.62 0.69  CALCIUM 9.2 8.7* 8.6* 8.8* 8.7*  MG  --   --   --  1.9 1.9     GFR: CrCl cannot be calculated (Unknown ideal weight.).  Liver Function Tests: Recent Labs  Lab 02/07/21 0430  AST 33  ALT 25  ALKPHOS 70  BILITOT 0.8  PROT 6.1*  ALBUMIN 3.2*     CBG: No results for input(s): GLUCAP in the last 168 hours.   Recent Results (from the past 240 hour(s))  Resp Panel by RT-PCR (Flu A&B, Covid) Nasopharyngeal Swab     Status: None   Collection Time: 02/06/21 10:32 PM   Specimen: Nasopharyngeal Swab; Nasopharyngeal(NP) swabs in vial transport medium  Result Value Ref Range Status   SARS Coronavirus 2 by RT PCR NEGATIVE NEGATIVE Final    Comment: (NOTE) SARS-CoV-2 target nucleic acids are NOT DETECTED.  The SARS-CoV-2 RNA is generally detectable in upper respiratory specimens during the acute phase of infection. The lowest concentration of SARS-CoV-2 viral copies this assay can detect is 138 copies/mL. A negative result does not preclude SARS-Cov-2 infection and should not be used as the sole basis for treatment or other patient management decisions. A negative result may occur with  improper specimen collection/handling, submission of specimen other than nasopharyngeal swab, presence of viral mutation(s) within the areas targeted by this assay, and inadequate number of viral copies(<138 copies/mL). A negative result must be combined with clinical observations, patient history, and epidemiological information. The expected result is Negative.  Fact Sheet  for Patients:  EntrepreneurPulse.com.au  Fact Sheet for Healthcare Providers:  IncredibleEmployment.be  This test is no t yet approved or cleared by the [place] FDA and  has been authorized for detection and/or diagnosis of SARS-CoV-2 by FDA under an Emergency Use Authorization (EUA). This EUA will remain  in effect (meaning this test can be used) for the duration of the COVID-19 declaration under Section 564(b)(1) of the Act, 21 U.S.C.section 360bbb-3(b)(1), unless the authorization is terminated  or revoked sooner.       Influenza A by PCR NEGATIVE NEGATIVE Final   Influenza B by PCR NEGATIVE NEGATIVE Final    Comment: (NOTE) The Xpert Xpress SARS-CoV-2/FLU/RSV plus assay is intended as an aid in the diagnosis of influenza from Nasopharyngeal swab specimens and should not be used as a sole basis for treatment. Nasal washings and aspirates are unacceptable for Xpert Xpress SARS-CoV-2/FLU/RSV testing.  Fact Sheet for Patients: EntrepreneurPulse.com.au  Fact Sheet for Healthcare Providers: IncredibleEmployment.be  This test is not yet approved or cleared by the [place] FDA and has been authorized for detection and/or diagnosis of SARS-CoV-2 by FDA under an Emergency Use Authorization (EUA). This EUA will remain in effect (meaning this test can be used) for the duration of the COVID-19 declaration under Section 564(b)(1) of the Act, 21 U.S.C. section 360bbb-3(b)(1), unless the authorization is terminated or revoked.  Performed at [facility], [place] [address], [place], [place] [address]           Radiology Studies: No results found.      Scheduled Meds:  ARIPiprazole  2 mg Oral Daily   Chlorhexidine Gluconate Cloth  6 each Topical Daily   cholecalciferol  125 mcg Oral Daily   docusate sodium  100 mg Oral BID   enoxaparin (LOVENOX) injection  40 mg Subcutaneous  Q24H   feeding supplement  237 mL Oral BID BM   fludrocortisone  0.1 mg Oral Daily   FLUoxetine  40 mg Oral Daily   loratadine  10 mg Oral q morning   memantine  10 mg Oral BID   midodrine  5 mg Oral BID WC   multivitamin with minerals  1 tablet Oral Daily   pantoprazole  40 mg Oral Daily   polyethylene glycol  17 g Oral BID   potassium chloride  10 mEq Oral q morning   pyridostigmine  60 mg Oral TID   senna-docusate  1 tablet Oral BID   Continuous Infusions:  methocarbamol (ROBAXIN) IV       LOS: 4 days    Time spent: 35 minutes    Irine Seal, MD [facility]   To contact the attending provider  between 7A-7P or the covering provider during after hours 7P-7A, please log into the web site [email or web address] and access using universal [place] password for that web site. If you do not have the password, please call the hospital operator.  02/10/2021, 12:40 PM

## 2021-02-10 NOTE — TOC Initial Note (Addendum)
Transition of Care [facility]) - Initial/Assessment Note    Patient Details  Name: Sabrina Page MRN: [id number] Date of Birth: [birth year]  Transition of [facility]) CM/SW Contact:    Trish Mage, LCSW Phone Number: 02/10/2021, 10:09 AM  Clinical Narrative:   Patient seen in follow up to PT recommendation of SNF.  Spoke with daughter at bedside. Sabrina Page states she works full time while also providing full time care for both parents; father has Alzheimers and patient is 2 person minimal assist for ambulation.  Sabrina Page has hired full time aide to be in home at all times.  Since patient broke her femur, she is 2 person max assist and as such cannot currently be cared for at home.  She is hoping her mother can go to short term rehab, but is wanting information sent out to only certain facilities.  If no bed offers, she plans on taking her mother home and figuring out plan B.  Bed search process explained and initiated. TOC will continue to follow during the course of hospitalization.  Addendum:  Upon hearing that [facility] declined her mother, Sabrina Page decided to take her mother home at d/c.  She asked that we get a bariatric wheelchair with elevated foot rest.               Expected Discharge Plan: [place] Barriers to Discharge: SNF Pending bed offer   Patient Goals and CMS Choice     Choice offered to / list presented to : Adult Children  Expected Discharge Plan and Services Expected Discharge Plan: [place]   Discharge Planning Services: CM Consult Post Acute Care Choice: [place] Living arrangements for the past 2 months: Single Family Home                                      Prior Living Arrangements/Services Living arrangements for the past 2 months: Single Family Home Lives with:: Spouse, Adult Children Patient language and need for interpreter reviewed:: Yes        Need for Family Participation in Patient Care:  Yes (Comment) Care giver support system in place?: Yes (comment) Current home services: DME Criminal Activity/Legal Involvement Pertinent to Current Situation/Hospitalization: No - Comment as needed  Activities of Daily Living Home Assistive Devices/Equipment: Other (Comment) (pt confused, unable to answer) ADL Screening (condition at time of admission) Patient's cognitive ability adequate to safely complete daily activities?: No Is the patient deaf or have difficulty hearing?: No Does the patient have difficulty seeing, even when wearing glasses/contacts?: No Does the patient have difficulty concentrating, remembering, or making decisions?: Yes Patient able to express need for assistance with ADLs?: No Does the patient have difficulty dressing or bathing?: Yes Independently performs ADLs?: No Does the patient have difficulty walking or climbing stairs?: Yes Weakness of Legs: Both Weakness of Arms/Hands: Both  Permission Sought/Granted Permission sought to share information with : Family Supports Permission granted to share information with : Yes, Verbal Permission Granted  Share Information with NAME: Sabrina Page (Daughter)   [phone]           Emotional Assessment Appearance:: Appears stated age Attitude/Demeanor/Rapport: Unable to Assess Affect (typically observed): Unable to Assess Orientation: : Oriented to Self Alcohol / Substance Use: Not Applicable Psych Involvement: No (comment)  Admission diagnosis:  Preop cardiovascular exam [C62.376] Closed fracture of right distal femur ([place]) [S72.401A] Other closed fracture of right  femur, unspecified portion of femur, initial encounter [facility]) [S72.8X1A] Patient Active Problem List   Diagnosis Date Noted   Prolonged QT interval 02/07/2021   Closed fracture of right distal femur ([place]) 02/06/2021   Dementia due to Alzheimer's disease ([place]) 02/06/2021   Orthostatic hypotension 02/06/2021   COPD (chronic obstructive  pulmonary disease) ([place]) 02/06/2021   Chronic diastolic heart failure ([place]) 02/06/2021   GERD (gastroesophageal reflux disease) 02/06/2021   Anxiety with depression 02/06/2021   COVID-19 virus infection 02/06/2021   PCP:  Cassandria Anger, MD Pharmacy:   [id number] - [place], [place] [address] [place] [address] Phone: [phone] Fax: [phone]     Social Determinants of Health (SDOH) Interventions    Readmission Risk Interventions No flowsheet data found.

## 2021-02-10 NOTE — Progress Notes (Signed)
Subjective: Patient reports pain as moderate. Complaining more so about not feeling well. When I asked where she didn't feel well she pointed to her stomach. Tolerating diet but no BM yet. Probably why her stomach hurts. Urinating. No CP, SOB. Working with PT on mobilizing and adjusting herself in and around the bed. Sounds like today's session went better than previous one.   Objective:   VITALS:   Vitals:   02/09/21 1730 02/09/21 1921 02/10/21 0511 02/10/21 1137  BP: (!) 141/69 (!) 101/58 (!) 119/58 (!) 111/46  Pulse:  87 85 84  Resp:  18 18 18   Temp:  98 F (36.7 C) 98.2 F (36.8 C)   TempSrc:  Oral Oral   SpO2:  99% 100% 100%   CBC Latest Ref Rng & Units 02/10/2021 02/09/2021 02/08/2021  WBC 4.0 - 10.5 K/uL 6.0 6.4 7.2  Hemoglobin 12.0 - 15.0 g/dL 10.6(L) 10.3(L) 10.7(L)  Hematocrit 36.0 - 46.0 % 32.3(L) 32.5(L) 33.6(L)  Platelets 150 - 400 K/uL 157 125(L) 122(L)   BMP Latest Ref Rng & Units 02/10/2021 02/09/2021 02/08/2021  Glucose 70 - 99 mg/dL 96 108(H) 119(H)  BUN 8 - 23 mg/dL 14 17 15   Creatinine 0.44 - 1.00 mg/dL 0.69 0.62 0.70  Sodium 135 - 145 mmol/L 134(L) 139 138  Potassium 3.5 - 5.1 mmol/L 4.9 4.7 4.5  Chloride 98 - 111 mmol/L 98 102 100  CO2 22 - 32 mmol/L 31 31 29   Calcium 8.9 - 10.3 mg/dL 8.7(L) 8.8(L) 8.6(L)   Intake/Output      09/19 0701 09/20 0700 09/20 0701 09/21 0700   P.O.  270   Total Intake  270   Urine 651    Total Output 651    Net -651 +270           Physical Exam: General: NAD.  Laying in bed. Calm but obvious discomfort Resp: No increased wob Cardio: regular rate and rhythm ABD soft Neurologically intact MSK Neurovascularly intact Sensation intact distally Intact pulses distally Dorsiflexion/Plantar flexion intact KI not currently on Incision: dressing C/D/I Some ecchymosis seen around knee Compartments soft   Assessment: 3 Days Post-Op  S/P Procedure(s) (LRB): OPEN REDUCTION INTERNAL FIXATION (ORIF) DISTAL FEMUR  FRACTURE (Right) by Dr. Ernesta Amble. Percell Miller on 02/07/21  Principal Problem:   Closed fracture of right distal femur ([place]) Active Problems:   Dementia due to Alzheimer's disease ([place])   Orthostatic hypotension   COPD (chronic obstructive pulmonary disease) ([facility])   Chronic diastolic heart failure ([facility])   GERD (gastroesophageal reflux disease)   Anxiety with depression   Prolonged QT interval    Plan:  Advance diet Up with therapy as tolerated.  Incentive Spirometry Elevate and Apply ice to knee I ordered PRN meds post-op for constipation if needed  Weightbearing: PWB 50%  RLE Insicional and dressing care: Dressings left intact until follow-up and Reinforce dressings as needed Orthopedic device(s):  KI Showering: Keep dressing dry VTE prophylaxis: Lovenox 40mg  qd  while inpatient, can switch to ASA 81mg  bid x 30 days upon d/c , SCDs, ambulation Pain control: Continue current regimen. Narcotics could be source of constipation although she is only getting Norco 5 it looks like Follow - up plan: 2 weeks post-op in the office Contact information:  Edmonia Lynch MD, Aggie Moats PA-C  Dispo:  Likely needs to go to SNF but sounds like her daughter is resistant to that. Would rather take her mother home and provide her with 24/7 care with the  help of an aid.  Perhaps HHPT could be set up for her. Ok to d/c from an ortho standpoint once medically ready.      Britt Bottom, PA-C Office [phone] 02/10/2021, 2:51 PM

## 2021-02-11 ENCOUNTER — Telehealth: Payer: Medicare Other | Admitting: Internal Medicine

## 2021-02-11 DIAGNOSIS — S72401P Unspecified fracture of lower end of right femur, subsequent encounter for closed fracture with malunion: Secondary | ICD-10-CM | POA: Diagnosis not present

## 2021-02-11 LAB — CBC
HCT: 30.8 % — ABNORMAL LOW (ref 36.0–46.0)
Hemoglobin: 9.8 g/dL — ABNORMAL LOW (ref 12.0–15.0)
MCH: 29.5 pg (ref 26.0–34.0)
MCHC: 31.8 g/dL (ref 30.0–36.0)
MCV: 92.8 fL (ref 80.0–100.0)
Platelets: 191 10*3/uL (ref 150–400)
RBC: 3.32 MIL/uL — ABNORMAL LOW (ref 3.87–5.11)
RDW: 13.7 % (ref 11.5–15.5)
WBC: 5 10*3/uL (ref 4.0–10.5)
nRBC: 0 % (ref 0.0–0.2)

## 2021-02-11 LAB — BASIC METABOLIC PANEL
Anion gap: 7 (ref 5–15)
BUN: 10 mg/dL (ref 8–23)
CO2: 30 mmol/L (ref 22–32)
Calcium: 8.3 mg/dL — ABNORMAL LOW (ref 8.9–10.3)
Chloride: 100 mmol/L (ref 98–111)
Creatinine, Ser: 0.56 mg/dL (ref 0.44–1.00)
GFR, Estimated: >60 mL/min (ref >60)
Glucose, Bld: 94 mg/dL (ref 70–99)
Potassium: 4.2 mmol/L (ref 3.5–5.1)
Sodium: 137 mmol/L (ref 135–145)

## 2021-02-11 LAB — MAGNESIUM: Magnesium: 2.1 mg/dL (ref 1.7–2.4)

## 2021-02-11 MED ORDER — ACETAMINOPHEN 500 MG PO TABS: 500.0000 mg | ORAL_TABLET | Freq: Four times a day (QID) | ORAL | 0 refills | Status: DC | PRN

## 2021-02-11 MED ORDER — ASPIRIN EC 81 MG PO TBEC: 81.0000 mg | DELAYED_RELEASE_TABLET | Freq: Two times a day (BID) | ORAL | 0 refills | Status: DC

## 2021-02-11 MED ORDER — POLYETHYLENE GLYCOL 3350 17 G PO PACK: 17.0000 g | PACK | Freq: Every day | ORAL | 0 refills | Status: AC | PRN

## 2021-02-11 MED ORDER — SENNOSIDES-DOCUSATE SODIUM 8.6-50 MG PO TABS: 1.0000 | ORAL_TABLET | Freq: Two times a day (BID) | ORAL | 0 refills | Status: AC

## 2021-02-11 MED ORDER — HYDROCODONE-ACETAMINOPHEN 5-325 MG PO TABS: 1.0000 | ORAL_TABLET | Freq: Four times a day (QID) | ORAL | 0 refills | Status: DC | PRN

## 2021-02-11 MED ORDER — METHOCARBAMOL 500 MG PO TABS
500.0000 mg | ORAL_TABLET | Freq: Four times a day (QID) | ORAL | 0 refills | Status: DC | PRN
Start: 2021-02-11 — End: 2021-08-04

## 2021-02-11 MED ORDER — BISACODYL 10 MG RE SUPP
10.0000 mg | Freq: Every day | RECTAL | 0 refills | Status: DC | PRN
Start: 2021-02-11 — End: 2021-08-01

## 2021-02-11 NOTE — Progress Notes (Signed)
Pt being discharged to home via [place]. Pt going home with oxygen and home health. Discharge instructions and medication education provided to pt's daughter at bedside.

## 2021-02-11 NOTE — Discharge Summary (Signed)
Physician Discharge Summary  Sabrina Page JXB:[id number] DOB: 1934/01/04 DOA: 02/06/2021  PCP: Cassandria Anger, MD  Admit date: 02/06/2021 Discharge date: 02/11/2021  Admitted From: Home Disposition: Home  Recommendations for Outpatient Follow-up:  Follow up with PCP in 1 week with repeat CBC/BMP Outpatient follow-up with orthopedics.  Wound care and discharge pain medication regimen as per orthopedics recommendations. Recommend outpatient evaluation and follow-up by palliative care if condition were to worsen Follow up in ED if symptoms worsen or new appear   Home Health: PT/OT Equipment/Devices: Oxygen via nasal cannula at 2 L/min  Discharge Condition: Guarded CODE STATUS: DNR Diet recommendation: Heart healthy/diet as per SLP recommendations: Dysphagia 3 (mechanical soft); thin liquid  Brief/Interim Summary: 87 year old female with history of anxiety, chronic diastolic heart failure, COPD, Alzheimer's dementia, GERD, orthostatic hypotension, COVID-19 last month requiring admission presented with a mechanical fall.  She was found to have right distal femur shaft fracture.  She underwent ORIF on 02/07/2021.  Subsequently, PT recommended SNF placement but eventually daughter decided to take her home.  She will be discharged home today with home health PT/OT.  Recommend palliative care consultation as an outpatient if condition were to worsen at home.  Discharge Diagnoses:   Closed fracture of distal right femur -Status post ORIF on 02/07/2021 by orthopedics.  Subsequently PT recommended SNF placement but eventually daughter decided to take her home. -Discharge patient home today  -Outpatient follow-up with orthopedics.  Wound care and discharge pain medication regimen as per orthopedics recommendations.  Continue aspirin 81 mg twice a day for 30 days for DVT prophylaxis as per orthopedics recommendations.  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  Blood pressure currently stable.  Outpatient follow-up.  Acute hypoxic respiratory failure -Possibly from atelectasis and poor inspiratory effort after hip surgery -Oxygen saturations dropping to the 80s on room air.  She will need oxygen via nasal cannula at 2 L/min upon discharge.  QTC prolongation -Improved.  Outpatient follow-up  Alzheimer's dementia -Continue memantine  Depression and anxiety -Continue Prozac and Abilify.  Outpatient follow-up with PCP/psychiatry  COPD -Continue home regimen.  Outpatient follow-up  Chronic systolic heart failure -Stable.  Resume Lasix as needed.  Outpatient follow-up.  Constipation -Continue bowel regimen   Discharge Instructions  Discharge Instructions     Diet - low sodium heart healthy   Complete by: As directed    Discharge wound care:   Complete by: As directed    As per orthopedics recommendations   Increase activity slowly   Complete by: As directed       Allergies as of 02/11/2021   No Known Allergies      Medication List     STOP taking these medications    docusate sodium 100 MG capsule Commonly known as: COLACE       TAKE these medications    acetaminophen 500 MG tablet Commonly known as: TYLENOL Take 1 tablet (500 mg total) by mouth every 6 (six) hours as needed for mild pain or moderate pain.   ARIPiprazole 2 MG tablet Commonly known as: ABILIFY Take 2 mg by mouth daily.   ASPIRIN 81 PO Take 1 tablet by mouth 2 (two) times daily. What changed: Another medication with the same name was added. Make sure you understand how and when to take each.   aspirin EC 81 MG tablet Take 1 tablet (81 mg total) by mouth 2 (two) times daily. For DVT prophylaxis for 30 days  after surgery. What changed: You were already taking a medication with the same name, and this prescription was added. Make sure you understand how and when to take each.    bisacodyl 10 MG suppository Commonly known as: DULCOLAX Place 1 suppository (10 mg total) rectally daily as needed for moderate constipation.   fludrocortisone 0.1 MG tablet Commonly known as: FLORINEF Take 0.1 mg by mouth daily. PLEASE SEE ATTACHED FOR DETAILED DIRECTIONS   FLUoxetine 40 MG capsule Commonly known as: PROZAC Take 40 mg by mouth daily.   furosemide 20 MG tablet Commonly known as: LASIX Take 20 mg by mouth every other day.   HYDROcodone-acetaminophen 5-325 MG tablet Commonly known as: Norco Take 1-2 tablets by mouth every 6 (six) hours as needed for moderate pain. MAXIMUM TOTAL ACETAMINOPHEN DOSE IS 4000 MG PER DAY   loratadine 10 MG tablet Commonly known as: CLARITIN Take 10 mg by mouth every morning.   memantine 10 MG tablet Commonly known as: NAMENDA Take 10 mg by mouth 2 (two) times daily.   methocarbamol 500 MG tablet Commonly known as: ROBAXIN Take 1 tablet (500 mg total) by mouth every 6 (six) hours as needed for muscle spasms.   midodrine 10 MG tablet Commonly known as: PROAMATINE Take 5 mg by mouth 2 (two) times daily.   nystatin cream Commonly known as: MYCOSTATIN Apply 1 application topically daily as needed.   omeprazole 20 MG capsule Commonly known as: PRILOSEC Take 40 mg by mouth daily.   polyethylene glycol 17 g packet Commonly known as: MIRALAX / GLYCOLAX Take 17 g by mouth daily as needed for mild constipation.   potassium chloride 8 MEQ tablet Commonly known as: KLOR-CON Take 8 mEq by mouth every morning.   pyridostigmine 60 MG tablet Commonly known as: MESTINON Take 60 mg by mouth 3 (three) times daily.   senna-docusate 8.6-50 MG tablet Commonly known as: Senokot-S Take 1 tablet by mouth 2 (two) times daily.   Vitamin D 125 MCG (5000 UT) Caps Take 1 capsule by mouth daily.               Durable Medical Equipment  (From admission, onward)           Start     Ordered   02/11/21 1304  For home use only DME  oxygen  Once       Question Answer Comment  Length of Need 6 Months   Mode or (Route) Nasal cannula   Liters per Minute 2   Frequency Continuous (stationary and portable oxygen unit needed)   Oxygen conserving device Yes   Oxygen delivery system Gas      02/11/21 1303   02/10/21 1734  For home use only DME high strength lightweight manual wheelchair with seat cushion  Once       Comments: Patient suffers from right distal femur fracture/debility which impairs their ability to perform daily activities like bathing, toileting, grooming in the home.  A walker will not resolve  issue with performing activities of daily living. A wheelchair will allow patient to safely perform daily activities.Length of need Lifetime.  Patient requires a size of bariatric which is not available in a standard or lightweight wheelchair and patient spends at least two hours per day in their chair. Accessories: elevating leg rests (ELRs), wheel locks, extensions and anti-tippers.   02/10/21 1735              Discharge Care Instructions  (From admission, onward)  Start     Ordered   02/11/21 0000  Discharge wound care:       Comments: As per orthopedics recommendations   02/11/21 0957            Follow-up Information     Plotnikov, Evie Lacks, MD. Schedule an appointment as soon as possible for a visit in 1 week(s).   Specialty: Internal Medicine Contact information: [place] [address] [phone]         Renette Butters, MD. Schedule an appointment as soon as possible for a visit in 1 week(s).   Specialty: Orthopedic Surgery Contact information: [address] [place] [address] [place] [address] [phone]                No Known Allergies  Consultations: Orthopedics   Procedures/Studies: DG Knee 1-2 Views Right  Result Date: 02/07/2021 CLINICAL DATA:  Right distal femur fracture ORIF EXAM: RIGHT KNEE - 1-2 VIEW COMPARISON:   Knee radiographs dated 1 day prior FINDINGS: Three C-arm fluoroscopic images were obtained intraoperatively and submitted for post operative interpretation. There has been interval sideplate and screw fixation of the displaced distal femur fracture. Alignment is significantly improved. There is no evidence of immediate complication. Fluoro time 34 seconds. Please see the performing provider's procedural report for further detail. IMPRESSION: Status post sideplate and screw fixation of the distal femur with improved fracture alignment. Electronically Signed   By: Valetta Mole M.D.   On: 02/07/2021 11:56   Chest Portable 1 View  Result Date: 02/06/2021 CLINICAL DATA:  Preop knee surgery EXAM: PORTABLE CHEST 1 VIEW COMPARISON:  01/13/2021 FINDINGS: The heart size and mediastinal contours are within normal limits. Both lungs are clear. The visualized skeletal structures are unremarkable. Low lung volumes. IMPRESSION: No active disease. Electronically Signed   By: Donavan Foil M.D.   On: 02/06/2021 23:20   DG Knee Complete 4 Views Right  Result Date: 02/06/2021 CLINICAL DATA:  Status post fall. EXAM: RIGHT KNEE - COMPLETE 4+ VIEW COMPARISON:  None. FINDINGS: Acute fracture deformity is seen involving the distal right femoral shaft. Medial angulation of the distal fracture site is seen. There is no evidence of dislocation. There is marked severity medial and lateral tibiofemoral compartment space narrowing. A small joint effusion is seen. IMPRESSION: 1. Acute fracture of the distal right femoral shaft. 2. Marked severity degenerative changes. 3. Small joint effusion. Electronically Signed   By: Virgina Norfolk M.D.   On: 02/06/2021 21:30   DG C-Arm 1-60 Min-No Report  Result Date: 02/07/2021 Fluoroscopy was utilized by the requesting physician.  No radiographic interpretation.   DG C-Arm 1-60 Min-No Report  Result Date: 02/07/2021 Fluoroscopy was utilized by the requesting physician.  No radiographic  interpretation.   DG Hip Unilat W or Wo Pelvis 2-3 Views Right  Result Date: 02/06/2021 CLINICAL DATA:  Fall, injury. EXAM: DG HIP (WITH OR WITHOUT PELVIS) 2-3V RIGHT COMPARISON:  None. FINDINGS: The bones are diffusely osteopenic. This limits evaluation for subtle nondisplaced fracture. A right hip arthroplasty appears in anatomic alignment. There is no acute fracture or hardware loosening identified. Soft tissues are within normal limits. Degenerative changes affect the lumbar spine. IMPRESSION: 1. Examination is limited by osteopenia. No definite acute fracture or dislocation. 2. Right hip arthroplasty in anatomic alignment. Electronically Signed   By: Ronney Asters M.D.   On: 02/06/2021 21:32      Subjective: Patient seen and examined at bedside.  Sleepy, wakes up slightly,  hardly answers any questions.  No overnight fever or vomiting reported.  Discharge Exam: Vitals:   02/10/21 2056 02/11/21 0356  BP: (!) 130/55 (!) 153/70  Pulse: 75 83  Resp: 18 20  Temp: 98.9 F (37.2 C) 97.8 F (36.6 C)  SpO2: 100% 99%    General: Elderly female lying in bed.  Currently on 2 L oxygen by nasal cannula.  Poor historian. Cardiovascular: rate controlled, S1/S2 + Respiratory: bilateral decreased breath sounds at bases with scattered crackles Abdominal: Soft, NT, ND, bowel sounds + Extremities: Trace lower extremity edema; no cyanosis    The results of significant diagnostics from this hospitalization (including imaging, microbiology, ancillary and laboratory) are listed below for reference.     Microbiology: Recent Results (from the past 240 hour(s))  Resp Panel by RT-PCR (Flu A&B, Covid) Nasopharyngeal Swab     Status: None   Collection Time: 02/06/21 10:32 PM   Specimen: Nasopharyngeal Swab; Nasopharyngeal(NP) swabs in vial transport medium  Result Value Ref Range Status   SARS Coronavirus 2 by RT PCR NEGATIVE NEGATIVE Final    Comment: (NOTE) SARS-CoV-2 target nucleic acids are NOT  DETECTED.  The SARS-CoV-2 RNA is generally detectable in upper respiratory specimens during the acute phase of infection. The lowest concentration of SARS-CoV-2 viral copies this assay can detect is 138 copies/mL. A negative result does not preclude SARS-Cov-2 infection and should not be used as the sole basis for treatment or other patient management decisions. A negative result may occur with  improper specimen collection/handling, submission of specimen other than nasopharyngeal swab, presence of viral mutation(s) within the areas targeted by this assay, and inadequate number of viral copies(<138 copies/mL). A negative result must be combined with clinical observations, patient history, and epidemiological information. The expected result is Negative.  Fact Sheet for Patients:  EntrepreneurPulse.com.au  Fact Sheet for Healthcare Providers:  IncredibleEmployment.be  This test is no t yet approved or cleared by the [place] FDA and  has been authorized for detection and/or diagnosis of SARS-CoV-2 by FDA under an Emergency Use Authorization (EUA). This EUA will remain  in effect (meaning this test can be used) for the duration of the COVID-19 declaration under Section 564(b)(1) of the Act, 21 U.S.C.section 360bbb-3(b)(1), unless the authorization is terminated  or revoked sooner.       Influenza A by PCR NEGATIVE NEGATIVE Final   Influenza B by PCR NEGATIVE NEGATIVE Final    Comment: (NOTE) The Xpert Xpress SARS-CoV-2/FLU/RSV plus assay is intended as an aid in the diagnosis of influenza from Nasopharyngeal swab specimens and should not be used as a sole basis for treatment. Nasal washings and aspirates are unacceptable for Xpert Xpress SARS-CoV-2/FLU/RSV testing.  Fact Sheet for Patients: EntrepreneurPulse.com.au  Fact Sheet for Healthcare Providers: IncredibleEmployment.be  This test is not yet  approved or cleared by the [place] FDA and has been authorized for detection and/or diagnosis of SARS-CoV-2 by FDA under an Emergency Use Authorization (EUA). This EUA will remain in effect (meaning this test can be used) for the duration of the COVID-19 declaration under Section 564(b)(1) of the Act, 21 U.S.C. section 360bbb-3(b)(1), unless the authorization is terminated or revoked.  Performed at [facility], [place] [address], [place], [place] [address]   SARS CORONAVIRUS 2 (TAT 6-24 HRS) Nasopharyngeal Nasopharyngeal Swab     Status: None   Collection Time: 02/10/21 11:42 AM   Specimen: Nasopharyngeal Swab  Result Value Ref Range Status   SARS Coronavirus 2 NEGATIVE NEGATIVE  Final    Comment: (NOTE) SARS-CoV-2 target nucleic acids are NOT DETECTED.  The SARS-CoV-2 RNA is generally detectable in upper and lower respiratory specimens during the acute phase of infection. Negative results do not preclude SARS-CoV-2 infection, do not rule out co-infections with other pathogens, and should not be used as the sole basis for treatment or other patient management decisions. Negative results must be combined with clinical observations, patient history, and epidemiological information. The expected result is Negative.  Fact Sheet for Patients: SugarRoll.be  Fact Sheet for Healthcare Providers: https://www.woods-mathews.com/  This test is not yet approved or cleared by the [place] FDA and  has been authorized for detection and/or diagnosis of SARS-CoV-2 by FDA under an Emergency Use Authorization (EUA). This EUA will remain  in effect (meaning this test can be used) for the duration of the COVID-19 declaration under Se ction 564(b)(1) of the Act, 21 U.S.C. section 360bbb-3(b)(1), unless the authorization is terminated or revoked sooner.  Performed at [facility], [place] [address] [place], [place] [address]       Labs: BNP (last 3 results) No results for input(s): BNP in the last 8760 hours. Basic Metabolic Panel: Recent Labs  Lab 02/07/21 0430 02/08/21 0536 02/09/21 0437 02/10/21 0509 02/11/21 0442  NA 140 138 139 134* 137  K 4.7 4.5 4.7 4.9 4.2  CL 103 100 102 98 100  CO2 28 29 31 31 30   GLUCOSE 145* 119* 108* 96 94  BUN 13 15 17 14 10   CREATININE 0.88 0.70 0.62 0.69 0.56  CALCIUM 8.7* 8.6* 8.8* 8.7* 8.3*  MG  --   --  1.9 1.9 2.1   Liver Function Tests: Recent Labs  Lab 02/07/21 0430  AST 33  ALT 25  ALKPHOS 70  BILITOT 0.8  PROT 6.1*  ALBUMIN 3.2*   No results for input(s): LIPASE, AMYLASE in the last 168 hours. No results for input(s): AMMONIA in the last 168 hours. CBC: Recent Labs  Lab 02/06/21 2210 02/07/21 0430 02/08/21 0536 02/09/21 0437 02/10/21 0509 02/11/21 0442  WBC 8.6 7.1 7.2 6.4 6.0 5.0  NEUTROABS 6.6  --   --   --   --   --   HGB 13.7 12.4 10.7* 10.3* 10.6* 9.8*  HCT 44.5 40.5 33.6* 32.5* 32.3* 30.8*  MCV 94.1 93.8 91.3 92.1 89.7 92.8  PLT 145* 152 122* 125* 157 191   Cardiac Enzymes: No results for input(s): CKTOTAL, CKMB, CKMBINDEX, TROPONINI in the last 168 hours. BNP: Invalid input(s): POCBNP CBG: No results for input(s): GLUCAP in the last 168 hours. D-Dimer No results for input(s): DDIMER in the last 72 hours. Hgb A1c No results for input(s): HGBA1C in the last 72 hours. Lipid Profile No results for input(s): CHOL, HDL, LDLCALC, TRIG, CHOLHDL, LDLDIRECT in the last 72 hours. Thyroid function studies No results for input(s): TSH, T4TOTAL, T3FREE, THYROIDAB in the last 72 hours.  Invalid input(s): FREET3 Anemia work up No results for input(s): VITAMINB12, FOLATE, FERRITIN, TIBC, IRON, RETICCTPCT in the last 72 hours. Urinalysis    Component Value Date/Time   COLORURINE YELLOW 02/10/2021 2100   APPEARANCEUR CLEAR 02/10/2021 2100   LABSPEC 1.010 02/10/2021 2100   PHURINE 7.5 02/10/2021 2100   GLUCOSEU NEGATIVE 02/10/2021  2100   HGBUR NEGATIVE 02/10/2021 2100   [place] NEGATIVE 02/10/2021 2100   [place] NEGATIVE 02/10/2021 2100   PROTEINUR NEGATIVE 02/10/2021 2100   NITRITE NEGATIVE 02/10/2021 2100   LEUKOCYTESUR NEGATIVE 02/10/2021 2100   Sepsis Labs  Invalid input(s): PROCALCITONIN,  WBC,  LACTICIDVEN Microbiology Recent Results (from the past 240 hour(s))  Resp Panel by RT-PCR (Flu A&B, Covid) Nasopharyngeal Swab     Status: None   Collection Time: 02/06/21 10:32 PM   Specimen: Nasopharyngeal Swab; Nasopharyngeal(NP) swabs in vial transport medium  Result Value Ref Range Status   SARS Coronavirus 2 by RT PCR NEGATIVE NEGATIVE Final    Comment: (NOTE) SARS-CoV-2 target nucleic acids are NOT DETECTED.  The SARS-CoV-2 RNA is generally detectable in upper respiratory specimens during the acute phase of infection. The lowest concentration of SARS-CoV-2 viral copies this assay can detect is 138 copies/mL. A negative result does not preclude SARS-Cov-2 infection and should not be used as the sole basis for treatment or other patient management decisions. A negative result may occur with  improper specimen collection/handling, submission of specimen other than nasopharyngeal swab, presence of viral mutation(s) within the areas targeted by this assay, and inadequate number of viral copies(<138 copies/mL). A negative result must be combined with clinical observations, patient history, and epidemiological information. The expected result is Negative.  Fact Sheet for Patients:  EntrepreneurPulse.com.au  Fact Sheet for Healthcare Providers:  IncredibleEmployment.be  This test is no t yet approved or cleared by the [place] FDA and  has been authorized for detection and/or diagnosis of SARS-CoV-2 by FDA under an Emergency Use Authorization (EUA). This EUA will remain  in effect (meaning this test can be used) for the duration of the COVID-19 declaration  under Section 564(b)(1) of the Act, 21 U.S.C.section 360bbb-3(b)(1), unless the authorization is terminated  or revoked sooner.       Influenza A by PCR NEGATIVE NEGATIVE Final   Influenza B by PCR NEGATIVE NEGATIVE Final    Comment: (NOTE) The Xpert Xpress SARS-CoV-2/FLU/RSV plus assay is intended as an aid in the diagnosis of influenza from Nasopharyngeal swab specimens and should not be used as a sole basis for treatment. Nasal washings and aspirates are unacceptable for Xpert Xpress SARS-CoV-2/FLU/RSV testing.  Fact Sheet for Patients: EntrepreneurPulse.com.au  Fact Sheet for Healthcare Providers: IncredibleEmployment.be  This test is not yet approved or cleared by the [place] FDA and has been authorized for detection and/or diagnosis of SARS-CoV-2 by FDA under an Emergency Use Authorization (EUA). This EUA will remain in effect (meaning this test can be used) for the duration of the COVID-19 declaration under Section 564(b)(1) of the Act, 21 U.S.C. section 360bbb-3(b)(1), unless the authorization is terminated or revoked.  Performed at [facility], [place] [address] [place], [place] [address]   SARS CORONAVIRUS 2 (TAT 6-24 HRS) Nasopharyngeal Nasopharyngeal Swab     Status: None   Collection Time: 02/10/21 11:42 AM   Specimen: Nasopharyngeal Swab  Result Value Ref Range Status   SARS Coronavirus 2 NEGATIVE NEGATIVE Final    Comment: (NOTE) SARS-CoV-2 target nucleic acids are NOT DETECTED.  The SARS-CoV-2 RNA is generally detectable in upper and lower respiratory specimens during the acute phase of infection. Negative results do not preclude SARS-CoV-2 infection, do not rule out co-infections with other pathogens, and should not be used as the sole basis for treatment or other patient management decisions. Negative results must be combined with clinical observations, patient history, and epidemiological  information. The expected result is Negative.  Fact Sheet for Patients: SugarRoll.be  Fact Sheet for Healthcare Providers: https://www.woods-mathews.com/  This test is not yet approved or cleared by the [place] FDA and  has been authorized for detection and/or diagnosis of  SARS-CoV-2 by FDA under an Emergency Use Authorization (EUA). This EUA will remain  in effect (meaning this test can be used) for the duration of the COVID-19 declaration under Se ction 564(b)(1) of the Act, 21 U.S.C. section 360bbb-3(b)(1), unless the authorization is terminated or revoked sooner.  Performed at [facility], [place] [address] [place],  [address]      Time coordinating discharge: 35 minutes  SIGNED:   Aline August, MD  [facility] 02/11/2021, 1:07 PM

## 2021-02-11 NOTE — TOC Transition Note (Addendum)
Transition of Care [facility]) - CM/SW Discharge Note   Patient Details  Name: Sabrina Page MRN: [id number] Date of Birth: 1933/06/29  [facility] of [facility] CM/SW Contact:  Trish Mage, LCSW Phone Number: 02/11/2021, 12:27 PM   Clinical Narrative:   Patient who is stable for d/c will return home today.  Wheelchair delivered.  Set up for [facility] services through [facility]-accepted by Seth Bake. PTAR arranged. Have contacted IT for help as daughter is unable to see My Chart.  They gave me number for daughter to call, which I passed on to her. No further needs identified.  TOC sign off.  Addendum:  After being informed that patient qualifies for home O2, contacted Ashely with [employer] who will arrange for delivery of home unit, travel cannister.    Final next level of care: [place] Barriers to Discharge: Barriers Resolved   Patient Goals and CMS Choice     Choice offered to / list presented to : Adult Children  Discharge Placement                       Discharge Plan and Services   Discharge Planning Services: CM Consult Post Acute Care Choice: Skilled Nursing Facility                               Social Determinants of Health (SDOH) Interventions     Readmission Risk Interventions No flowsheet data found.

## 2021-02-11 NOTE — Progress Notes (Signed)
Physical Therapy Treatment Patient Details Name: Sabrina Page MRN: [id number] DOB: 11-22-1933 Today's Date: 02/11/2021   History of Present Illness Patient is a 87 year old female who presented to the hospital on 02/06/21 after a fall at home. patient was found to have acute fracture of right distal femoral shaft with severe DJD and small effusion. Pt s/p ORIF on 02/07/21 and is PWB 50% post op with KI while weight bearing. PMH: chronic diastolic CHF, COPD, alzheimer's, GERD, orthostatic    PT Comments    Pt appears tired today.  She was able to sit up EOB and tolerated 4 extremity ROM.  O2 sats during mobility were borderline and she did drop when we returned to supine and she started to fall to sleep to 86% on RA, increasing to 95% with 2 L O2 [place].  MD and CSW made aware and home O2 ordered.  I had plans to lift OOB to chair today, however, she is planning to d/c home this afternoon and PTAR will want her in the bed to get her on the gurney.  PT to follow acutely until d/c confirmed.     Recommendations for follow up therapy are one component of a multi-disciplinary discharge planning process, led by the attending physician.  Recommendations may be updated based on patient status, additional functional criteria and insurance authorization.  Follow Up Recommendations  SNF (daughter plans to take her home with 24/7 caregivers)     Equipment Recommendations  Wheelchair (measurements PT);Wheelchair cushion (measurements PT);Other (comment) (20 x20 WC with elevating leg rests)    Recommendations for Other Services       Precautions / Restrictions Precautions Precautions: Fall Precaution Comments: Continue to monitor O2, especially at rest when dozing. Required Braces or Orthoses: Knee Immobilizer - Right Knee Immobilizer - Right: On when out of bed or walking ("when weight bearing") Restrictions RLE Weight Bearing: Partial weight bearing RLE Partial Weight Bearing Percentage or  Pounds: 50     Mobility  Bed Mobility Overal bed mobility: Needs Assistance Bed Mobility: Rolling;Supine to Sit;Sit to Supine Rolling: Max assist;+2 for physical assistance   Supine to sit: Max assist;+2 for physical assistance;HOB elevated Sit to supine: Max assist;+2 for physical assistance   General bed mobility comments: Max assist today to roll bil for pericare, max assist from elevated HOB to support trunk and move both legs to the side as well as weight shift using bed pad to scoot.    Transfers                 General transfer comment: NT-plans to lift OOB to chair today, but she is d/c home, so kept in the bed for PTAR transport (ambulance) home.  Ambulation/Gait                 Stairs             Wheelchair Mobility    Modified Rankin (Stroke Patients Only)       Balance Overall balance assessment: Needs assistance Sitting-balance support: Feet supported;Bilateral upper extremity supported Sitting balance-Leahy Scale: Poor Sitting balance - Comments: up to max assist EOB, initial sit was better today than yesterday, but pt fatigued quickly nodding off to sleep while EOB.  Pt did not have any pain meds this AM per RN, so drowsiness is not from pain meds.  Sat EOB ~15 mins today to take morning medications before repositioned back in bed.  Cognition Arousal/Alertness: Lethargic (seems very fatigued today) Behavior During Therapy: WFL for tasks assessed/performed Overall Cognitive Status: Difficult to assess                                        Exercises General Exercises - Upper Extremity Shoulder Flexion: AAROM;Both;10 reps Elbow Extension: AAROM;Both;10 reps General Exercises - Lower Extremity Ankle Circles/Pumps: AAROM;Both;20 reps Heel Slides: AAROM;Both;10 reps Hip ABduction/ADduction: AAROM;Both;10 reps    General Comments General comments (skin integrity, edema,  etc.): removed KI in the bed to prevent any skin breakdown, only needs to be on when WB on right leg.  O2 did decrease when dozing off in bed at rest to 86%, MD and RN case manager made aware.      Pertinent Vitals/Pain Pain Assessment: Faces Faces Pain Scale: Hurts little more Pain Location: R knee Pain Descriptors / Indicators: Grimacing;Guarding Pain Intervention(s): Limited activity within patient's tolerance;Monitored during session;Repositioned    Home Living                      Prior Function            PT Goals (current goals can now be found in the care plan section) Acute Rehab PT Goals Patient Stated Goal: daughter reports caregivers are nervous. Progress towards PT goals: Progressing toward goals    Frequency    Min 3X/week      PT Plan Current plan remains appropriate    Co-evaluation              AM-PAC PT "6 Clicks" Mobility   Outcome Measure  Help needed turning from your back to your side while in a flat bed without using bedrails?: Total Help needed moving from lying on your back to sitting on the side of a flat bed without using bedrails?: Total Help needed moving to and from a bed to a chair (including a wheelchair)?: Total Help needed standing up from a chair using your arms (e.g., wheelchair or bedside chair)?: Total Help needed to walk in hospital room?: Total Help needed climbing 3-5 steps with a railing? : Total 6 Click Score: 6    End of Session Equipment Utilized During Treatment: Oxygen Activity Tolerance: Patient limited by fatigue;Patient limited by pain Patient left: in bed;with call bell/phone within reach;with family/visitor present Nurse Communication: Other (comment) (O2 sat decrease) PT Visit Diagnosis: Muscle weakness (generalized) (M62.81);Pain;Difficulty in walking, not elsewhere classified (R26.2) Pain - Right/Left: Right Pain - part of body: Knee     Time: 1207-1300 PT Time Calculation (min) (ACUTE ONLY):  53 min  Charges:  $Therapeutic Exercise: 23-37 mins $Therapeutic Activity: 23-37 mins                     Verdene Lennert, PT, DPT  Acute Rehabilitation Ortho Tech Supervisor [phone] pager [phone]) [phone] office

## 2021-02-11 NOTE — Clinical Social Work Note (Signed)
    Durable Medical Equipment  (From admission, onward)           Start     Ordered   02/10/21 1734  For home use only DME high strength lightweight manual wheelchair with seat cushion  Once       Comments: Patient suffers from right distal femur fracture/debility which impairs their ability to perform daily activities like bathing, toileting, grooming in the home.  A walker will not resolve  issue with performing activities of daily living. A wheelchair will allow patient to safely perform daily activities.Length of need Lifetime.  Patient requires a size of bariatric which is not available in a standard or lightweight wheelchair and patient spends at least two hours per day in their chair. Accessories: elevating leg rests (ELRs), wheel locks, extensions and anti-tippers.   02/10/21 1735

## 2021-02-11 NOTE — Progress Notes (Signed)
SATURATION QUALIFICATIONS: (This note is used to comply with regulatory documentation for home oxygen)  Patient Saturations on Room Air at Rest = 86%  Patient Saturations on Room Air while Ambulating/mobilizing = 89%  Patient Saturations on 2 Liters of oxygen while Ambulating/mobilizing and while at rest = 95%  Please briefly explain why patient needs home oxygen: Pt desaturates while in the bed at rest dozing off to sleep.  She does better when mobilizing, but will have limited mobility until she recovers from her fracture.   Thanks,  Verdene Lennert, PT, DPT  Acute Rehabilitation Ortho Tech Supervisor [phone] pager [phone] office

## 2021-02-12 ENCOUNTER — Encounter (HOSPITAL_COMMUNITY): Payer: Self-pay | Admitting: Student

## 2021-02-12 LAB — URINE CULTURE

## 2021-02-13 DIAGNOSIS — I5042 Chronic combined systolic (congestive) and diastolic (congestive) heart failure: Secondary | ICD-10-CM | POA: Diagnosis not present

## 2021-02-13 DIAGNOSIS — F0280 Dementia in other diseases classified elsewhere without behavioral disturbance: Secondary | ICD-10-CM | POA: Diagnosis not present

## 2021-02-13 DIAGNOSIS — G309 Alzheimer's disease, unspecified: Secondary | ICD-10-CM | POA: Diagnosis not present

## 2021-02-13 DIAGNOSIS — G9009 Other idiopathic peripheral autonomic neuropathy: Secondary | ICD-10-CM | POA: Diagnosis not present

## 2021-02-13 DIAGNOSIS — Z9181 History of falling: Secondary | ICD-10-CM | POA: Diagnosis not present

## 2021-02-13 DIAGNOSIS — Z87440 Personal history of urinary (tract) infections: Secondary | ICD-10-CM | POA: Diagnosis not present

## 2021-02-13 DIAGNOSIS — K219 Gastro-esophageal reflux disease without esophagitis: Secondary | ICD-10-CM | POA: Diagnosis not present

## 2021-02-13 DIAGNOSIS — Z7401 Bed confinement status: Secondary | ICD-10-CM | POA: Diagnosis not present

## 2021-02-13 DIAGNOSIS — I951 Orthostatic hypotension: Secondary | ICD-10-CM | POA: Diagnosis not present

## 2021-02-13 DIAGNOSIS — F32A Depression, unspecified: Secondary | ICD-10-CM | POA: Diagnosis not present

## 2021-02-13 DIAGNOSIS — I110 Hypertensive heart disease with heart failure: Secondary | ICD-10-CM | POA: Diagnosis not present

## 2021-02-13 DIAGNOSIS — K5900 Constipation, unspecified: Secondary | ICD-10-CM | POA: Diagnosis not present

## 2021-02-13 DIAGNOSIS — J449 Chronic obstructive pulmonary disease, unspecified: Secondary | ICD-10-CM | POA: Diagnosis not present

## 2021-02-13 DIAGNOSIS — F419 Anxiety disorder, unspecified: Secondary | ICD-10-CM | POA: Diagnosis not present

## 2021-02-13 DIAGNOSIS — S72401D Unspecified fracture of lower end of right femur, subsequent encounter for closed fracture with routine healing: Secondary | ICD-10-CM | POA: Diagnosis not present

## 2021-02-13 DIAGNOSIS — Z7982 Long term (current) use of aspirin: Secondary | ICD-10-CM | POA: Diagnosis not present

## 2021-02-13 DIAGNOSIS — F0390 Unspecified dementia without behavioral disturbance: Secondary | ICD-10-CM | POA: Diagnosis not present

## 2021-02-13 DIAGNOSIS — Z8616 Personal history of COVID-19: Secondary | ICD-10-CM | POA: Diagnosis not present

## 2021-02-13 DIAGNOSIS — M48062 Spinal stenosis, lumbar region with neurogenic claudication: Secondary | ICD-10-CM | POA: Diagnosis not present

## 2021-02-17 ENCOUNTER — Telehealth: Payer: Self-pay | Admitting: Internal Medicine

## 2021-02-17 ENCOUNTER — Telehealth: Payer: Self-pay

## 2021-02-17 DIAGNOSIS — I5042 Chronic combined systolic (congestive) and diastolic (congestive) heart failure: Secondary | ICD-10-CM | POA: Diagnosis not present

## 2021-02-17 DIAGNOSIS — G309 Alzheimer's disease, unspecified: Secondary | ICD-10-CM | POA: Diagnosis not present

## 2021-02-17 DIAGNOSIS — J449 Chronic obstructive pulmonary disease, unspecified: Secondary | ICD-10-CM | POA: Diagnosis not present

## 2021-02-17 DIAGNOSIS — S72401D Unspecified fracture of lower end of right femur, subsequent encounter for closed fracture with routine healing: Secondary | ICD-10-CM | POA: Diagnosis not present

## 2021-02-17 DIAGNOSIS — F0280 Dementia in other diseases classified elsewhere without behavioral disturbance: Secondary | ICD-10-CM | POA: Diagnosis not present

## 2021-02-17 DIAGNOSIS — I110 Hypertensive heart disease with heart failure: Secondary | ICD-10-CM | POA: Diagnosis not present

## 2021-02-17 NOTE — Telephone Encounter (Signed)
Called Sabrina Page gave verbal for speech therapy. Pt is in good standing w/ appts. Next ov 03/04/21.Marland KitchenJohny Chess

## 2021-02-17 NOTE — Telephone Encounter (Signed)
[facility] for UA, CBC, iron drawn at home OK air matress - do we need VOV for it? Thx

## 2021-02-17 NOTE — Telephone Encounter (Signed)
Please advise as Sonia Baller from [facility] is calling in for verbal orders [facility] speech Therapy 1xweek for 2weeks for swallowing difficulties.  Sonia [phone]

## 2021-02-18 ENCOUNTER — Other Ambulatory Visit: Payer: Medicare Other | Admitting: Hospice

## 2021-02-18 ENCOUNTER — Other Ambulatory Visit: Payer: Self-pay

## 2021-02-18 DIAGNOSIS — S72491D Other fracture of lower end of right femur, subsequent encounter for closed fracture with routine healing: Secondary | ICD-10-CM | POA: Diagnosis not present

## 2021-02-18 DIAGNOSIS — R52 Pain, unspecified: Secondary | ICD-10-CM | POA: Diagnosis not present

## 2021-02-18 DIAGNOSIS — F0390 Unspecified dementia without behavioral disturbance: Secondary | ICD-10-CM

## 2021-02-18 DIAGNOSIS — Z515 Encounter for palliative care: Secondary | ICD-10-CM | POA: Diagnosis not present

## 2021-02-18 NOTE — Progress Notes (Signed)
[place] Consult Note Telephone: [phone]  Fax: [phone]  PATIENT NAME: Sabrina Page DOB: 27-Jun-1933 MRN: [id number]  PRIMARY CARE PROVIDER:   Cassandria Anger, MD Plotnikov, Evie Lacks, MD [address] [place],  [place] [address]  REFERRING PROVIDER: Cassandria Anger, MD Plotnikov, Evie Lacks, MD [place],  [place] [address]  RESPONSIBLE PARTY:  [name] -  Physical therapist, best to call Contact Information     Name Relation Home Work Mobile   [place] Daughter [phone]     [name] Daughter   [phone]     [place]: [phone] - caregiver, next to call [place] caregiver Jacqulynn Cadet - caregiver [phone]   Visit is to build trust and highlight Palliative Medicine as specialized medical care for people living with serious illness, aimed at facilitating better quality of life through symptoms relief, assisting with advance care planning and complex medical decision making. NP called [name] before and after the visit to update her on visit. Jacqulynn Cadet and patient's spouse are at home with patient during visit. This is a follow up visit.  RECOMMENDATIONS/PLAN:   Advance Care Planning/Code Status: Patient is a Do Not Resuscitate  Goals of Care: Goals of care include to maximize quality of life and symptom management. [name] is interested in hospice service in the future  Visit consisted of counseling and education dealing with the complex and emotionally intense issues of symptom management and palliative care in the setting of serious and potentially life-threatening illness. Palliative care team will continue to support patient, patient's family, and medical team.  Symptom management/Plan:  Right femur fracture: s/p ORIF. ST and PT/OT is ongoing. Follow up with Ortho as scheduled. Pain: Hydrocodone on hand. Not needed in day, per caregiver Yelana.  Dementia:  Advanced. Memory loss and confusion in line with Dementia disease trajectory. FAST 7b. Total care, uses stander for transfers. Continue ongoing supportive care.  Weakness: PT/OT is ongoing. Pressure wound: Yelena reports sacral pressure wound, open with skin layer disruption. Spoke with [name] who said home health nurse is expected at home, not sure when.  Education provided on position changes Recommendation:  Home Health nurse for wound care Air mattress bed will be beneficial to patient- promote better blood circulation, pressure relief and reduction of risk of pressure ulcers.  Follow up: Palliative care will continue to follow for complex medical decision making, advance care planning, and clarification of goals. Return 6 weeks or prn. Encouraged to call provider sooner with any concerns.  CHIEF COMPLAINT: Palliative follow up  HISTORY OF PRESENT ILLNESS:  Sabrina Page a 87 y.o. female with multiple medical problems including right femur fracture for which patient was hospitalized earlier this month, s/p ORIF. History of  advanced dementia, COPD/asthma , chronic diastolic CHF, UTI, obesity, HTN, hyperlipidemia, sacral pressure ulcer. Patient denies pain/discomfort; no urinary symptoms. History obtained from review of EMR, discussion with primary team, family and/or patient. Records reviewed and summarized above. All 10 point systems reviewed and are negative except as documented in history of present illness above  Review and summarization of Epic records shows history from other than patient.   Palliative Care was asked to follow this patient o help address complex decision making in the context of advance care planning and goals of care clarification.   PHYSICAL EXAM  Height/Weight 5 feet 4 inches/ 233Ibs down from 270 Ibs 2 months ago. Weight loss is desired. General: In no acute distress, appropriately dressed,  FLACC 0 Cardiovascular: regular rate and rhythm; no edema in  BLE Pulmonary: no cough, no increased work of breathing, normal respiratory effort Abdomen: soft, non tender, no guarding, positive bowel sounds in all quadrants GU:  no suprapubic tenderness Eyes: Normal lids, no discharge ENMT: Moist mucous membranes Musculoskeletal:  weakness, compression hose to BLE, max assist for transfers, non ambulatory. Skin: no rash to visible skin. Dressing to fracture repair site in right distal femur clean, dry and intact Psych: non-anxious affect Neurological: Weakness but otherwise non focal Heme/lymph/immuno: no bruises, no bleeding  PERTINENT MEDICATIONS:  Outpatient Encounter Medications as of 02/18/2021  Medication Sig   acetaminophen (TYLENOL) 500 MG tablet Take 500 mg by mouth every 6 (six) hours as needed for headache (pain).   acetaminophen (TYLENOL) 500 MG tablet Take 1 tablet (500 mg total) by mouth every 6 (six) hours as needed for mild pain or moderate pain.   ARIPiprazole (ABILIFY) 2 MG tablet TAKE 1 TABLET BY MOUTH EVERY DAY (Patient taking differently: Take 2 mg by mouth daily.)   ARIPiprazole (ABILIFY) 2 MG tablet Take 2 mg by mouth daily.   aspirin 81 MG EC tablet Take 1 tablet (81 mg total) by mouth 2 (two) times daily.   ASPIRIN 81 PO Take 1 tablet by mouth 2 (two) times daily.   aspirin EC 81 MG tablet Take 1 tablet (81 mg total) by mouth 2 (two) times daily. For DVT prophylaxis for 30 days after surgery.   B Complex-Folic Acid (B COMPLEX PLUS) TABS Take 1 tablet by mouth daily.   bisacodyl (DULCOLAX) 10 MG suppository Place 1 suppository (10 mg total) rectally daily as needed for moderate constipation.   Calcium-Vitamin D-Vitamin K 500-200-90 MG-UNT-MCG TABS 1 po qd (Patient taking differently: Take 1 tablet by mouth daily.)   Cholecalciferol (VITAMIN D) 125 MCG (5000 UT) CAPS Take 1 capsule by mouth daily.   docusate sodium (STOOL SOFTENER) 100 MG capsule Take 1 capsule (100 mg total) by mouth 2 (two) times daily. (Patient taking  differently: Take 100 mg by mouth every other day.)   fludrocortisone (FLORINEF) 0.1 MG tablet Take 1 tablet (100 mcg total) by mouth daily. Annual appt due in Oct must see provider for future refills (Patient taking differently: Take 100 mcg by mouth daily.)   fludrocortisone (FLORINEF) 0.1 MG tablet Take 0.1 mg by mouth daily. PLEASE SEE ATTACHED FOR DETAILED DIRECTIONS   FLUoxetine (PROZAC) 40 MG capsule TAKE 1 CAPSULE BY MOUTH EVERY DAY (Patient taking differently: Take 40 mg by mouth daily.)   FLUoxetine (PROZAC) 40 MG capsule Take 40 mg by mouth daily.   furosemide (LASIX) 20 MG tablet Take 1 tablet (20 mg total) by mouth every other day.   furosemide (LASIX) 20 MG tablet Take 20 mg by mouth every other day.   HYDROcodone-acetaminophen (NORCO) 5-325 MG tablet Take 1-2 tablets by mouth every 6 (six) hours as needed for moderate pain. MAXIMUM TOTAL ACETAMINOPHEN DOSE IS 4000 MG PER DAY   loratadine (CLARITIN) 10 MG tablet TAKE 1 TABLET BY MOUTH EVERY DAY IN THE MORNING (Patient taking differently: Take 10 mg by mouth daily.)   loratadine (CLARITIN) 10 MG tablet Take 10 mg by mouth every morning.   memantine (NAMENDA) 10 MG tablet Take 1 tablet (10 mg total) by mouth 2 (two) times daily. Annual appt due in Oct must see provider for future refills   memantine (NAMENDA) 10 MG tablet Take 10 mg by mouth 2 (two) times daily.   methocarbamol (ROBAXIN)  500 MG tablet Take 1 tablet (500 mg total) by mouth every 6 (six) hours as needed for muscle spasms.   midodrine (PROAMATINE) 10 MG tablet Take 0.5 tablets (5 mg total) by mouth 3 (three) times daily as needed (For SBP less than 110). May also take an additional 5mg  if BP gets too low   midodrine (PROAMATINE) 10 MG tablet Take 5 mg by mouth 2 (two) times daily.   Multiple Vitamins-Minerals (ZINC PO) Take 1 tablet by mouth daily.   nystatin cream (MYCOSTATIN) Apply 1 application topically daily as needed for dry skin (rash/irritation/itching).   nystatin  cream (MYCOSTATIN) Apply 1 application topically daily as needed.   omeprazole (PRILOSEC) 20 MG capsule Take 2 capsules (40 mg total) by mouth daily.   omeprazole (PRILOSEC) 20 MG capsule Take 40 mg by mouth daily.   OVER THE COUNTER MEDICATION Apply 1 application topically daily as needed (knee pain). Hemp Freeze   polyethylene glycol (MIRALAX / GLYCOLAX) 17 g packet Take 17 g by mouth daily as needed for mild constipation.   potassium chloride (KLOR-CON) 8 MEQ tablet Take 1 tablet (8 mEq total) by mouth every morning.   potassium chloride (KLOR-CON) 8 MEQ tablet Take 8 mEq by mouth every morning.   pyridostigmine (MESTINON) 60 MG tablet Take 1 tablet (60 mg total) by mouth 3 (three) times daily.   pyridostigmine (MESTINON) 60 MG tablet Take 60 mg by mouth 3 (three) times daily.   senna-docusate (SENOKOT-S) 8.6-50 MG tablet Take 1 tablet by mouth 2 (two) times daily.   traMADol (ULTRAM) 50 MG tablet Take 1 tablet (50 mg total) by mouth every 12 (twelve) hours as needed for severe pain.   No facility-administered encounter medications on file as of 02/18/2021.    HOSPICE ELIGIBILITY/DIAGNOSIS: TBD  PAST MEDICAL HISTORY:  Past Medical History:  Diagnosis Date   Allergy    rhinitis   Anxiety with depression 02/06/2021   Asthma    Chronic diastolic heart failure ([place]) 02/06/2021   Chronic kidney disease    hx of cystitis    COPD (chronic obstructive pulmonary disease) (HCC)    COPD (chronic obstructive pulmonary disease) ([place]) 02/06/2021   COVID-19 virus infection 02/06/2021   Dementia due to Alzheimer's disease ([place]) 02/06/2021   Depression    Dizziness    vertigo    Gallstones    GERD (gastroesophageal reflux disease)    GERD (gastroesophageal reflux disease) 02/06/2021   Hyperlipidemia    Hypertension    Obesity    Orthostatic hypotension 02/06/2021   Osteoarthritis    Osteoporosis    Peripheral vascular disease (HCC)    swelling    Pneumonia    Shortness of breath    with  exertion    Shoulder injury    left   Sleep apnea    never diagnosed    Urinary incontinence      ALLERGIES:  Allergies  Allergen Reactions   Atorvastatin Other (See Comments)    REACTION: upset stomach   Enalapril Maleate Other (See Comments)    Unknown reaction   Hctz [Hydrochlorothiazide] Other (See Comments)    dizziness   Lovastatin Other (See Comments)    Unknown reaction   Namenda [Memantine Hcl]     Feeling bad   Simvastatin Other (See Comments)    REACTION: mouth sores   Topamax [Topiramate] Other (See Comments)    syncope      I spent 60 minutes providing this consultation; this includes time spent with patient/family,  chart review and documentation. More than 50% of the time in this consultation was spent on counseling and coordinating communication   Thank you for the opportunity to participate in the care of [place] Please call our office at [phone] if we can be of additional assistance.  Note: Portions of this note were generated with Lobbyist. Dictation errors may occur despite best attempts at proofreading.  Teodoro Spray, NP

## 2021-02-18 NOTE — Telephone Encounter (Signed)
Ok Thx 

## 2021-02-20 DIAGNOSIS — J449 Chronic obstructive pulmonary disease, unspecified: Secondary | ICD-10-CM | POA: Diagnosis not present

## 2021-02-20 DIAGNOSIS — I110 Hypertensive heart disease with heart failure: Secondary | ICD-10-CM | POA: Diagnosis not present

## 2021-02-20 DIAGNOSIS — G309 Alzheimer's disease, unspecified: Secondary | ICD-10-CM | POA: Diagnosis not present

## 2021-02-20 DIAGNOSIS — S72401D Unspecified fracture of lower end of right femur, subsequent encounter for closed fracture with routine healing: Secondary | ICD-10-CM | POA: Diagnosis not present

## 2021-02-20 DIAGNOSIS — I5042 Chronic combined systolic (congestive) and diastolic (congestive) heart failure: Secondary | ICD-10-CM | POA: Diagnosis not present

## 2021-02-20 DIAGNOSIS — F0280 Dementia in other diseases classified elsewhere without behavioral disturbance: Secondary | ICD-10-CM | POA: Diagnosis not present

## 2021-02-21 DIAGNOSIS — I5042 Chronic combined systolic (congestive) and diastolic (congestive) heart failure: Secondary | ICD-10-CM | POA: Diagnosis not present

## 2021-02-21 DIAGNOSIS — F0280 Dementia in other diseases classified elsewhere without behavioral disturbance: Secondary | ICD-10-CM | POA: Diagnosis not present

## 2021-02-21 DIAGNOSIS — J449 Chronic obstructive pulmonary disease, unspecified: Secondary | ICD-10-CM | POA: Diagnosis not present

## 2021-02-21 DIAGNOSIS — G309 Alzheimer's disease, unspecified: Secondary | ICD-10-CM | POA: Diagnosis not present

## 2021-02-21 DIAGNOSIS — I110 Hypertensive heart disease with heart failure: Secondary | ICD-10-CM | POA: Diagnosis not present

## 2021-02-21 DIAGNOSIS — S72401D Unspecified fracture of lower end of right femur, subsequent encounter for closed fracture with routine healing: Secondary | ICD-10-CM | POA: Diagnosis not present

## 2021-02-23 DIAGNOSIS — S72451D Displaced supracondylar fracture without intracondylar extension of lower end of right femur, subsequent encounter for closed fracture with routine healing: Secondary | ICD-10-CM | POA: Diagnosis not present

## 2021-02-24 ENCOUNTER — Encounter (HOSPITAL_COMMUNITY): Payer: Self-pay | Admitting: Orthopedic Surgery

## 2021-02-24 DIAGNOSIS — J449 Chronic obstructive pulmonary disease, unspecified: Secondary | ICD-10-CM | POA: Diagnosis not present

## 2021-02-24 DIAGNOSIS — I5042 Chronic combined systolic (congestive) and diastolic (congestive) heart failure: Secondary | ICD-10-CM | POA: Diagnosis not present

## 2021-02-24 DIAGNOSIS — F0280 Dementia in other diseases classified elsewhere without behavioral disturbance: Secondary | ICD-10-CM | POA: Diagnosis not present

## 2021-02-24 DIAGNOSIS — S72401D Unspecified fracture of lower end of right femur, subsequent encounter for closed fracture with routine healing: Secondary | ICD-10-CM | POA: Diagnosis not present

## 2021-02-24 DIAGNOSIS — I110 Hypertensive heart disease with heart failure: Secondary | ICD-10-CM | POA: Diagnosis not present

## 2021-02-24 DIAGNOSIS — G309 Alzheimer's disease, unspecified: Secondary | ICD-10-CM | POA: Diagnosis not present

## 2021-02-25 DIAGNOSIS — S72401D Unspecified fracture of lower end of right femur, subsequent encounter for closed fracture with routine healing: Secondary | ICD-10-CM | POA: Diagnosis not present

## 2021-02-25 DIAGNOSIS — I110 Hypertensive heart disease with heart failure: Secondary | ICD-10-CM | POA: Diagnosis not present

## 2021-02-25 DIAGNOSIS — G309 Alzheimer's disease, unspecified: Secondary | ICD-10-CM | POA: Diagnosis not present

## 2021-02-25 DIAGNOSIS — J449 Chronic obstructive pulmonary disease, unspecified: Secondary | ICD-10-CM | POA: Diagnosis not present

## 2021-02-25 DIAGNOSIS — F0280 Dementia in other diseases classified elsewhere without behavioral disturbance: Secondary | ICD-10-CM | POA: Diagnosis not present

## 2021-02-25 DIAGNOSIS — I5042 Chronic combined systolic (congestive) and diastolic (congestive) heart failure: Secondary | ICD-10-CM | POA: Diagnosis not present

## 2021-02-28 DIAGNOSIS — I110 Hypertensive heart disease with heart failure: Secondary | ICD-10-CM | POA: Diagnosis not present

## 2021-02-28 DIAGNOSIS — J449 Chronic obstructive pulmonary disease, unspecified: Secondary | ICD-10-CM | POA: Diagnosis not present

## 2021-02-28 DIAGNOSIS — F0280 Dementia in other diseases classified elsewhere without behavioral disturbance: Secondary | ICD-10-CM | POA: Diagnosis not present

## 2021-02-28 DIAGNOSIS — S72401D Unspecified fracture of lower end of right femur, subsequent encounter for closed fracture with routine healing: Secondary | ICD-10-CM | POA: Diagnosis not present

## 2021-02-28 DIAGNOSIS — I5042 Chronic combined systolic (congestive) and diastolic (congestive) heart failure: Secondary | ICD-10-CM | POA: Diagnosis not present

## 2021-02-28 DIAGNOSIS — G309 Alzheimer's disease, unspecified: Secondary | ICD-10-CM | POA: Diagnosis not present

## 2021-03-02 DIAGNOSIS — F0280 Dementia in other diseases classified elsewhere without behavioral disturbance: Secondary | ICD-10-CM | POA: Diagnosis not present

## 2021-03-02 DIAGNOSIS — S72401D Unspecified fracture of lower end of right femur, subsequent encounter for closed fracture with routine healing: Secondary | ICD-10-CM | POA: Diagnosis not present

## 2021-03-02 DIAGNOSIS — G309 Alzheimer's disease, unspecified: Secondary | ICD-10-CM | POA: Diagnosis not present

## 2021-03-02 DIAGNOSIS — I5042 Chronic combined systolic (congestive) and diastolic (congestive) heart failure: Secondary | ICD-10-CM | POA: Diagnosis not present

## 2021-03-02 DIAGNOSIS — J449 Chronic obstructive pulmonary disease, unspecified: Secondary | ICD-10-CM | POA: Diagnosis not present

## 2021-03-02 DIAGNOSIS — I110 Hypertensive heart disease with heart failure: Secondary | ICD-10-CM | POA: Diagnosis not present

## 2021-03-03 ENCOUNTER — Telehealth (INDEPENDENT_AMBULATORY_CARE_PROVIDER_SITE_OTHER): Payer: Medicare Other | Admitting: Internal Medicine

## 2021-03-03 DIAGNOSIS — G309 Alzheimer's disease, unspecified: Secondary | ICD-10-CM

## 2021-03-03 DIAGNOSIS — F329 Major depressive disorder, single episode, unspecified: Secondary | ICD-10-CM | POA: Diagnosis not present

## 2021-03-03 DIAGNOSIS — I951 Orthostatic hypotension: Secondary | ICD-10-CM | POA: Diagnosis not present

## 2021-03-03 DIAGNOSIS — I5042 Chronic combined systolic (congestive) and diastolic (congestive) heart failure: Secondary | ICD-10-CM | POA: Diagnosis not present

## 2021-03-03 DIAGNOSIS — I5032 Chronic diastolic (congestive) heart failure: Secondary | ICD-10-CM | POA: Diagnosis not present

## 2021-03-03 DIAGNOSIS — S72491D Other fracture of lower end of right femur, subsequent encounter for closed fracture with routine healing: Secondary | ICD-10-CM

## 2021-03-03 DIAGNOSIS — J449 Chronic obstructive pulmonary disease, unspecified: Secondary | ICD-10-CM | POA: Diagnosis not present

## 2021-03-03 DIAGNOSIS — I110 Hypertensive heart disease with heart failure: Secondary | ICD-10-CM | POA: Diagnosis not present

## 2021-03-03 DIAGNOSIS — S72401D Unspecified fracture of lower end of right femur, subsequent encounter for closed fracture with routine healing: Secondary | ICD-10-CM | POA: Diagnosis not present

## 2021-03-03 DIAGNOSIS — F0280 Dementia in other diseases classified elsewhere without behavioral disturbance: Secondary | ICD-10-CM

## 2021-03-03 MED ORDER — AMOXICILLIN 500 MG PO CAPS: 500.0000 mg | ORAL_CAPSULE | Freq: Three times a day (TID) | ORAL | 2 refills | Status: DC

## 2021-03-03 NOTE — Telephone Encounter (Signed)
Pt hd ov 03/03/21.Marland Kitchen order was [facility]ed to [facility] for labs.Marland KitchenJohny Chess

## 2021-03-03 NOTE — Progress Notes (Signed)
Virtual Visit via Video Note  I connected with Sabrina Page on 03/08/21 at  8:10 AM EDT by a video enabled telemedicine application and verified that I am speaking with the correct person using two identifiers.   I discussed the limitations of evaluation and management by telemedicine and the availability of in person appointments. The patient expressed understanding and agreed to proceed.  I was located at our [facility] office. The patient was at home. There was her daughter Sabrina Page present in the visit.   History of Present Illness: We need to follow-up on her fairly recent hospital stay in September 2022.  The patient fell and broke her right leg on 99/17/22.  She requires a lot of assistance with everything.  Her dementia is progressing.  The history is provided by Sabrina Page  Per hx:   Admit date: 02/06/2021 Discharge date: 02/11/2021   Admitted From: Home Disposition: Home   Recommendations for Outpatient Follow-up:  Follow up with PCP in 1 week with repeat CBC/BMP Outpatient follow-up with orthopedics.  Wound care and discharge pain medication regimen as per orthopedics recommendations. Recommend outpatient evaluation and follow-up by palliative care if condition were to worsen Follow up in ED if symptoms worsen or new appear     Home Health: PT/OT Equipment/Devices: Oxygen via nasal cannula at 2 L/min   Discharge Condition: Guarded CODE STATUS: DNR Diet recommendation: Heart healthy/diet as per SLP recommendations: Dysphagia 3 (mechanical soft); thin liquid   Brief/Interim Summary: 87 year old female with history of anxiety, chronic diastolic heart failure, COPD, Alzheimer's dementia, GERD, orthostatic hypotension, COVID-19 last month requiring admission presented with a mechanical fall.  She was found to have right distal femur shaft fracture.  She underwent ORIF on 02/07/2021.  Subsequently, PT recommended SNF placement but eventually daughter decided to take her  home.  She will be discharged home today with home health PT/OT.  Recommend palliative care consultation as an outpatient if condition were to worsen at home.   Discharge Diagnoses:    Closed fracture of distal right femur -Status post ORIF on 02/07/2021 by orthopedics.  Subsequently PT recommended SNF placement but eventually daughter decided to take her home. -Discharge patient home today  -Outpatient follow-up with orthopedics.  Wound care and discharge pain medication regimen as per orthopedics recommendations.  Continue aspirin 81 mg twice a day for 30 days for DVT prophylaxis as per orthopedics recommendations.   Orthostatic hypotension from dysautonomia related to idiopathic peripheral neuropathy -Continue Florinef and Mestinon and midodrine.  Blood pressure currently stable.  Outpatient follow-up.   Acute hypoxic respiratory failure -Possibly from atelectasis and poor inspiratory effort after hip surgery -Oxygen saturations dropping to the 80s on room air.  She will need oxygen via nasal cannula at 2 L/min upon discharge.   QTC prolongation -Improved.  Outpatient follow-up   Alzheimer's dementia -Continue memantine   Depression and anxiety -Continue Prozac and Abilify.  Outpatient follow-up with PCP/psychiatry  COPD -Continue home regimen.  Outpatient follow-up   Chronic systolic heart failure -Stable.  Resume Lasix as needed.  Outpatient follow-up.   Constipation -Continue bowel regimen     Observations/Objective: The patient appears chronically ill  Assessment and Plan:  See my Assessment and Plan. Follow Up Instructions:    I discussed the assessment and treatment plan with the patient. The patient was provided an opportunity to ask questions and all were answered. The patient agreed with the plan and demonstrated an understanding of the instructions.   The patient was advised to call  back or seek an in-person evaluation if the symptoms worsen or if the  condition fails to improve as anticipated.  I provided face-to-face time during this encounter. We were at different locations.   [name], MD

## 2021-03-05 DIAGNOSIS — F0280 Dementia in other diseases classified elsewhere without behavioral disturbance: Secondary | ICD-10-CM | POA: Diagnosis not present

## 2021-03-05 DIAGNOSIS — I5042 Chronic combined systolic (congestive) and diastolic (congestive) heart failure: Secondary | ICD-10-CM | POA: Diagnosis not present

## 2021-03-05 DIAGNOSIS — Z87440 Personal history of urinary (tract) infections: Secondary | ICD-10-CM | POA: Diagnosis not present

## 2021-03-05 DIAGNOSIS — G309 Alzheimer's disease, unspecified: Secondary | ICD-10-CM | POA: Diagnosis not present

## 2021-03-05 DIAGNOSIS — K5900 Constipation, unspecified: Secondary | ICD-10-CM | POA: Diagnosis not present

## 2021-03-05 DIAGNOSIS — Z7982 Long term (current) use of aspirin: Secondary | ICD-10-CM | POA: Diagnosis not present

## 2021-03-05 DIAGNOSIS — K219 Gastro-esophageal reflux disease without esophagitis: Secondary | ICD-10-CM | POA: Diagnosis not present

## 2021-03-05 DIAGNOSIS — J449 Chronic obstructive pulmonary disease, unspecified: Secondary | ICD-10-CM | POA: Diagnosis not present

## 2021-03-05 DIAGNOSIS — G9009 Other idiopathic peripheral autonomic neuropathy: Secondary | ICD-10-CM | POA: Diagnosis not present

## 2021-03-05 DIAGNOSIS — I951 Orthostatic hypotension: Secondary | ICD-10-CM | POA: Diagnosis not present

## 2021-03-05 DIAGNOSIS — S72401D Unspecified fracture of lower end of right femur, subsequent encounter for closed fracture with routine healing: Secondary | ICD-10-CM | POA: Diagnosis not present

## 2021-03-05 DIAGNOSIS — F32A Depression, unspecified: Secondary | ICD-10-CM | POA: Diagnosis not present

## 2021-03-05 DIAGNOSIS — I110 Hypertensive heart disease with heart failure: Secondary | ICD-10-CM | POA: Diagnosis not present

## 2021-03-05 DIAGNOSIS — F411 Generalized anxiety disorder: Secondary | ICD-10-CM | POA: Diagnosis not present

## 2021-03-05 DIAGNOSIS — N399 Disorder of urinary system, unspecified: Secondary | ICD-10-CM | POA: Diagnosis not present

## 2021-03-05 DIAGNOSIS — D509 Iron deficiency anemia, unspecified: Secondary | ICD-10-CM | POA: Diagnosis not present

## 2021-03-07 DIAGNOSIS — G309 Alzheimer's disease, unspecified: Secondary | ICD-10-CM | POA: Diagnosis not present

## 2021-03-07 DIAGNOSIS — I5042 Chronic combined systolic (congestive) and diastolic (congestive) heart failure: Secondary | ICD-10-CM | POA: Diagnosis not present

## 2021-03-07 DIAGNOSIS — J449 Chronic obstructive pulmonary disease, unspecified: Secondary | ICD-10-CM | POA: Diagnosis not present

## 2021-03-07 DIAGNOSIS — S72401D Unspecified fracture of lower end of right femur, subsequent encounter for closed fracture with routine healing: Secondary | ICD-10-CM | POA: Diagnosis not present

## 2021-03-07 DIAGNOSIS — F0280 Dementia in other diseases classified elsewhere without behavioral disturbance: Secondary | ICD-10-CM | POA: Diagnosis not present

## 2021-03-07 DIAGNOSIS — I110 Hypertensive heart disease with heart failure: Secondary | ICD-10-CM | POA: Diagnosis not present

## 2021-03-08 ENCOUNTER — Encounter: Payer: Self-pay | Admitting: Internal Medicine

## 2021-03-08 NOTE — Assessment & Plan Note (Signed)
Continue with Prozac and Abilify

## 2021-03-08 NOTE — Assessment & Plan Note (Signed)
Status post ORIF on 02/07/2021 by orthopedics.  Subsequently PT recommended SNF placement but eventually daughter decided to take her home. Fall prevention discussed

## 2021-03-08 NOTE — Assessment & Plan Note (Signed)
Severe and progressing.  Continue with memantine

## 2021-03-08 NOTE — Assessment & Plan Note (Signed)
In general her functional capacity is extremely limited and complicated by orthostatic hypotension. Continue Florinef and Mestinon and midodrine.  Blood pressure currently stable. Discussed.  No CHF exacerbation at present.  She is on oxygen.

## 2021-03-08 NOTE — Assessment & Plan Note (Addendum)
In general her functional capacity is extremely limited and complicated by orthostatic hypotension. Continue Florinef and Mestinon and midodrine.  Blood pressure currently stable. Discussed.  No CHF exacerbation at present.  She is on oxygen.

## 2021-03-11 DIAGNOSIS — F0280 Dementia in other diseases classified elsewhere without behavioral disturbance: Secondary | ICD-10-CM | POA: Diagnosis not present

## 2021-03-11 DIAGNOSIS — G309 Alzheimer's disease, unspecified: Secondary | ICD-10-CM | POA: Diagnosis not present

## 2021-03-11 DIAGNOSIS — I5042 Chronic combined systolic (congestive) and diastolic (congestive) heart failure: Secondary | ICD-10-CM | POA: Diagnosis not present

## 2021-03-11 DIAGNOSIS — S72401D Unspecified fracture of lower end of right femur, subsequent encounter for closed fracture with routine healing: Secondary | ICD-10-CM | POA: Diagnosis not present

## 2021-03-11 DIAGNOSIS — I110 Hypertensive heart disease with heart failure: Secondary | ICD-10-CM | POA: Diagnosis not present

## 2021-03-11 DIAGNOSIS — J449 Chronic obstructive pulmonary disease, unspecified: Secondary | ICD-10-CM | POA: Diagnosis not present

## 2021-03-13 DIAGNOSIS — I5042 Chronic combined systolic (congestive) and diastolic (congestive) heart failure: Secondary | ICD-10-CM | POA: Diagnosis not present

## 2021-03-13 DIAGNOSIS — S72401D Unspecified fracture of lower end of right femur, subsequent encounter for closed fracture with routine healing: Secondary | ICD-10-CM | POA: Diagnosis not present

## 2021-03-13 DIAGNOSIS — F0280 Dementia in other diseases classified elsewhere without behavioral disturbance: Secondary | ICD-10-CM | POA: Diagnosis not present

## 2021-03-13 DIAGNOSIS — J449 Chronic obstructive pulmonary disease, unspecified: Secondary | ICD-10-CM | POA: Diagnosis not present

## 2021-03-13 DIAGNOSIS — G309 Alzheimer's disease, unspecified: Secondary | ICD-10-CM | POA: Diagnosis not present

## 2021-03-13 DIAGNOSIS — I110 Hypertensive heart disease with heart failure: Secondary | ICD-10-CM | POA: Diagnosis not present

## 2021-03-15 DIAGNOSIS — F419 Anxiety disorder, unspecified: Secondary | ICD-10-CM | POA: Diagnosis not present

## 2021-03-15 DIAGNOSIS — Z7401 Bed confinement status: Secondary | ICD-10-CM | POA: Diagnosis not present

## 2021-03-15 DIAGNOSIS — F32A Depression, unspecified: Secondary | ICD-10-CM | POA: Diagnosis not present

## 2021-03-15 DIAGNOSIS — Z7982 Long term (current) use of aspirin: Secondary | ICD-10-CM | POA: Diagnosis not present

## 2021-03-15 DIAGNOSIS — G309 Alzheimer's disease, unspecified: Secondary | ICD-10-CM | POA: Diagnosis not present

## 2021-03-15 DIAGNOSIS — Z87440 Personal history of urinary (tract) infections: Secondary | ICD-10-CM | POA: Diagnosis not present

## 2021-03-15 DIAGNOSIS — F0280 Dementia in other diseases classified elsewhere without behavioral disturbance: Secondary | ICD-10-CM | POA: Diagnosis not present

## 2021-03-15 DIAGNOSIS — J449 Chronic obstructive pulmonary disease, unspecified: Secondary | ICD-10-CM | POA: Diagnosis not present

## 2021-03-15 DIAGNOSIS — G9009 Other idiopathic peripheral autonomic neuropathy: Secondary | ICD-10-CM | POA: Diagnosis not present

## 2021-03-15 DIAGNOSIS — K219 Gastro-esophageal reflux disease without esophagitis: Secondary | ICD-10-CM | POA: Diagnosis not present

## 2021-03-15 DIAGNOSIS — I110 Hypertensive heart disease with heart failure: Secondary | ICD-10-CM | POA: Diagnosis not present

## 2021-03-15 DIAGNOSIS — I5042 Chronic combined systolic (congestive) and diastolic (congestive) heart failure: Secondary | ICD-10-CM | POA: Diagnosis not present

## 2021-03-15 DIAGNOSIS — Z8616 Personal history of COVID-19: Secondary | ICD-10-CM | POA: Diagnosis not present

## 2021-03-15 DIAGNOSIS — F0390 Unspecified dementia without behavioral disturbance: Secondary | ICD-10-CM | POA: Diagnosis not present

## 2021-03-15 DIAGNOSIS — I951 Orthostatic hypotension: Secondary | ICD-10-CM | POA: Diagnosis not present

## 2021-03-15 DIAGNOSIS — S72401D Unspecified fracture of lower end of right femur, subsequent encounter for closed fracture with routine healing: Secondary | ICD-10-CM | POA: Diagnosis not present

## 2021-03-15 DIAGNOSIS — K5900 Constipation, unspecified: Secondary | ICD-10-CM | POA: Diagnosis not present

## 2021-03-15 DIAGNOSIS — Z9181 History of falling: Secondary | ICD-10-CM | POA: Diagnosis not present

## 2021-03-15 DIAGNOSIS — M48062 Spinal stenosis, lumbar region with neurogenic claudication: Secondary | ICD-10-CM | POA: Diagnosis not present

## 2021-03-19 ENCOUNTER — Telehealth: Payer: Self-pay | Admitting: *Deleted

## 2021-03-19 DIAGNOSIS — I5042 Chronic combined systolic (congestive) and diastolic (congestive) heart failure: Secondary | ICD-10-CM | POA: Diagnosis not present

## 2021-03-19 DIAGNOSIS — J449 Chronic obstructive pulmonary disease, unspecified: Secondary | ICD-10-CM | POA: Diagnosis not present

## 2021-03-19 DIAGNOSIS — G309 Alzheimer's disease, unspecified: Secondary | ICD-10-CM | POA: Diagnosis not present

## 2021-03-19 DIAGNOSIS — S72401D Unspecified fracture of lower end of right femur, subsequent encounter for closed fracture with routine healing: Secondary | ICD-10-CM | POA: Diagnosis not present

## 2021-03-19 DIAGNOSIS — I110 Hypertensive heart disease with heart failure: Secondary | ICD-10-CM | POA: Diagnosis not present

## 2021-03-19 DIAGNOSIS — F0280 Dementia in other diseases classified elsewhere without behavioral disturbance: Secondary | ICD-10-CM | POA: Diagnosis not present

## 2021-03-19 NOTE — Telephone Encounter (Signed)
Refaxed order. Pls see first msg../lmb

## 2021-03-19 NOTE — Telephone Encounter (Signed)
Ahsly w/ [employer] calling to state faxes received were distorted  Sabrina Page is requesting faxes sent again on another fax machine [phone]  Sabrina Page states if faxes received from different fax machine are still distorted, he can come to the office to pick forms up  Please call if he needs to pick up forms [phone]

## 2021-03-19 NOTE — Telephone Encounter (Signed)
Ok Thx 

## 2021-03-19 NOTE — Telephone Encounter (Signed)
Spoke w/ Scot Jun. Can fax order for the non-invasive ventilator to [phone]. Also will need MD to addend ov 10/11, and put (1). 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 (2) Non-Invasive Ventilator is recommended to reduce oxygen retension, Sustain life, and keep out of being hospitalizations. Can fax ov notes to [phone]

## 2021-03-20 DIAGNOSIS — J449 Chronic obstructive pulmonary disease, unspecified: Secondary | ICD-10-CM | POA: Diagnosis not present

## 2021-03-20 DIAGNOSIS — I5042 Chronic combined systolic (congestive) and diastolic (congestive) heart failure: Secondary | ICD-10-CM | POA: Diagnosis not present

## 2021-03-20 DIAGNOSIS — I110 Hypertensive heart disease with heart failure: Secondary | ICD-10-CM | POA: Diagnosis not present

## 2021-03-20 DIAGNOSIS — F0280 Dementia in other diseases classified elsewhere without behavioral disturbance: Secondary | ICD-10-CM | POA: Diagnosis not present

## 2021-03-20 DIAGNOSIS — G309 Alzheimer's disease, unspecified: Secondary | ICD-10-CM | POA: Diagnosis not present

## 2021-03-20 DIAGNOSIS — S72401D Unspecified fracture of lower end of right femur, subsequent encounter for closed fracture with routine healing: Secondary | ICD-10-CM | POA: Diagnosis not present

## 2021-03-23 DIAGNOSIS — S72451D Displaced supracondylar fracture without intracondylar extension of lower end of right femur, subsequent encounter for closed fracture with routine healing: Secondary | ICD-10-CM | POA: Diagnosis not present

## 2021-03-25 NOTE — Telephone Encounter (Signed)
Sabrina Page w/ [employer] states he looked but can not find the amendment   Sabrina Page is requesting a call back for clarification [phone]

## 2021-03-26 DIAGNOSIS — S72401D Unspecified fracture of lower end of right femur, subsequent encounter for closed fracture with routine healing: Secondary | ICD-10-CM | POA: Diagnosis not present

## 2021-03-26 DIAGNOSIS — J449 Chronic obstructive pulmonary disease, unspecified: Secondary | ICD-10-CM | POA: Diagnosis not present

## 2021-03-26 DIAGNOSIS — I110 Hypertensive heart disease with heart failure: Secondary | ICD-10-CM | POA: Diagnosis not present

## 2021-03-26 DIAGNOSIS — I5042 Chronic combined systolic (congestive) and diastolic (congestive) heart failure: Secondary | ICD-10-CM | POA: Diagnosis not present

## 2021-03-26 DIAGNOSIS — F0280 Dementia in other diseases classified elsewhere without behavioral disturbance: Secondary | ICD-10-CM | POA: Diagnosis not present

## 2021-03-26 DIAGNOSIS — G309 Alzheimer's disease, unspecified: Secondary | ICD-10-CM | POA: Diagnosis not present

## 2021-03-27 DIAGNOSIS — F0280 Dementia in other diseases classified elsewhere without behavioral disturbance: Secondary | ICD-10-CM | POA: Diagnosis not present

## 2021-03-27 DIAGNOSIS — J449 Chronic obstructive pulmonary disease, unspecified: Secondary | ICD-10-CM | POA: Diagnosis not present

## 2021-03-27 DIAGNOSIS — I110 Hypertensive heart disease with heart failure: Secondary | ICD-10-CM | POA: Diagnosis not present

## 2021-03-27 DIAGNOSIS — S72401D Unspecified fracture of lower end of right femur, subsequent encounter for closed fracture with routine healing: Secondary | ICD-10-CM | POA: Diagnosis not present

## 2021-03-27 DIAGNOSIS — I5042 Chronic combined systolic (congestive) and diastolic (congestive) heart failure: Secondary | ICD-10-CM | POA: Diagnosis not present

## 2021-03-27 DIAGNOSIS — G309 Alzheimer's disease, unspecified: Secondary | ICD-10-CM | POA: Diagnosis not present

## 2021-04-10 DIAGNOSIS — S72401D Unspecified fracture of lower end of right femur, subsequent encounter for closed fracture with routine healing: Secondary | ICD-10-CM | POA: Diagnosis not present

## 2021-04-10 DIAGNOSIS — F0280 Dementia in other diseases classified elsewhere without behavioral disturbance: Secondary | ICD-10-CM | POA: Diagnosis not present

## 2021-04-10 DIAGNOSIS — G309 Alzheimer's disease, unspecified: Secondary | ICD-10-CM | POA: Diagnosis not present

## 2021-04-10 DIAGNOSIS — I5042 Chronic combined systolic (congestive) and diastolic (congestive) heart failure: Secondary | ICD-10-CM | POA: Diagnosis not present

## 2021-04-10 DIAGNOSIS — I110 Hypertensive heart disease with heart failure: Secondary | ICD-10-CM | POA: Diagnosis not present

## 2021-04-10 DIAGNOSIS — J449 Chronic obstructive pulmonary disease, unspecified: Secondary | ICD-10-CM | POA: Diagnosis not present

## 2021-04-14 DIAGNOSIS — Z993 Dependence on wheelchair: Secondary | ICD-10-CM | POA: Diagnosis not present

## 2021-04-14 DIAGNOSIS — Z7982 Long term (current) use of aspirin: Secondary | ICD-10-CM | POA: Diagnosis not present

## 2021-04-14 DIAGNOSIS — Z9181 History of falling: Secondary | ICD-10-CM | POA: Diagnosis not present

## 2021-04-14 DIAGNOSIS — I110 Hypertensive heart disease with heart failure: Secondary | ICD-10-CM | POA: Diagnosis not present

## 2021-04-14 DIAGNOSIS — F32A Depression, unspecified: Secondary | ICD-10-CM | POA: Diagnosis not present

## 2021-04-14 DIAGNOSIS — F0284 Dementia in other diseases classified elsewhere, unspecified severity, with anxiety: Secondary | ICD-10-CM | POA: Diagnosis not present

## 2021-04-14 DIAGNOSIS — I951 Orthostatic hypotension: Secondary | ICD-10-CM | POA: Diagnosis not present

## 2021-04-14 DIAGNOSIS — K219 Gastro-esophageal reflux disease without esophagitis: Secondary | ICD-10-CM | POA: Diagnosis not present

## 2021-04-14 DIAGNOSIS — S72401D Unspecified fracture of lower end of right femur, subsequent encounter for closed fracture with routine healing: Secondary | ICD-10-CM | POA: Diagnosis not present

## 2021-04-14 DIAGNOSIS — K5900 Constipation, unspecified: Secondary | ICD-10-CM | POA: Diagnosis not present

## 2021-04-14 DIAGNOSIS — Z7952 Long term (current) use of systemic steroids: Secondary | ICD-10-CM | POA: Diagnosis not present

## 2021-04-14 DIAGNOSIS — Z87440 Personal history of urinary (tract) infections: Secondary | ICD-10-CM | POA: Diagnosis not present

## 2021-04-14 DIAGNOSIS — Z8616 Personal history of COVID-19: Secondary | ICD-10-CM | POA: Diagnosis not present

## 2021-04-14 DIAGNOSIS — G9009 Other idiopathic peripheral autonomic neuropathy: Secondary | ICD-10-CM | POA: Diagnosis not present

## 2021-04-14 DIAGNOSIS — J449 Chronic obstructive pulmonary disease, unspecified: Secondary | ICD-10-CM | POA: Diagnosis not present

## 2021-04-14 DIAGNOSIS — I5042 Chronic combined systolic (congestive) and diastolic (congestive) heart failure: Secondary | ICD-10-CM | POA: Diagnosis not present

## 2021-04-14 DIAGNOSIS — G309 Alzheimer's disease, unspecified: Secondary | ICD-10-CM | POA: Diagnosis not present

## 2021-04-14 DIAGNOSIS — M48062 Spinal stenosis, lumbar region with neurogenic claudication: Secondary | ICD-10-CM | POA: Diagnosis not present

## 2021-04-18 DIAGNOSIS — J449 Chronic obstructive pulmonary disease, unspecified: Secondary | ICD-10-CM | POA: Diagnosis not present

## 2021-04-18 DIAGNOSIS — G309 Alzheimer's disease, unspecified: Secondary | ICD-10-CM | POA: Diagnosis not present

## 2021-04-18 DIAGNOSIS — S72401D Unspecified fracture of lower end of right femur, subsequent encounter for closed fracture with routine healing: Secondary | ICD-10-CM | POA: Diagnosis not present

## 2021-04-18 DIAGNOSIS — I110 Hypertensive heart disease with heart failure: Secondary | ICD-10-CM | POA: Diagnosis not present

## 2021-04-18 DIAGNOSIS — F0284 Dementia in other diseases classified elsewhere, unspecified severity, with anxiety: Secondary | ICD-10-CM | POA: Diagnosis not present

## 2021-04-18 DIAGNOSIS — I5042 Chronic combined systolic (congestive) and diastolic (congestive) heart failure: Secondary | ICD-10-CM | POA: Diagnosis not present

## 2021-04-20 ENCOUNTER — Other Ambulatory Visit: Payer: Medicare Other | Admitting: Hospice

## 2021-04-20 ENCOUNTER — Other Ambulatory Visit: Payer: Self-pay

## 2021-04-20 DIAGNOSIS — L304 Erythema intertrigo: Secondary | ICD-10-CM

## 2021-04-20 DIAGNOSIS — F0390 Unspecified dementia without behavioral disturbance: Secondary | ICD-10-CM | POA: Diagnosis not present

## 2021-04-20 DIAGNOSIS — Z515 Encounter for palliative care: Secondary | ICD-10-CM

## 2021-04-20 NOTE — Progress Notes (Addendum)
[facility] Palliative Care Consult Note Telephone: [phone]  Fax: [phone]  PATIENT NAME: Sabrina Page DOB: Oct 01, 1933 MRN: [id number]  PRIMARY CARE PROVIDER:   Cassandria Anger, MD Plotnikov, Evie Lacks, MD [address] [place],  [place] [address]  REFERRING PROVIDER: Cassandria Anger, MD Plotnikov, Evie Lacks, MD [address] [place],   [address]  RESPONSIBLE PARTY:  Sabrina Page -  Physical therapist, best to call Contact Information     Name Relation Home Work Mobile   Sabrina Page Daughter Sabrina Page Daughter   [phone]      Sabrina Page [phone] - caregiver Sabrina Page - caregiver [phone]   Visit is to build trust and highlight Palliative Medicine as specialized medical care for people living with serious illness, aimed at facilitating better quality of life through symptoms relief, assisting with advance care planning and complex medical decision making. NP called Sabrina Page and updated her on the visit; she is in agreement with plan of care for mild erythema under the breasts. Sabrina Page and patient's spouse are at home with patient during visit. This is a follow up visit.  RECOMMENDATIONS/PLAN:   Advance Care Planning/Code Status: Patient is a Do Not Resuscitate  Goals of Care: Goals of care include to maximize quality of life and symptom management. Sabrina Page is interested in hospice service in the future Palliative care team will continue to support patient, patient's family, and medical team.  Symptom management/Plan:  Intertrigo: Recurring from last summer. Use OTC Lotrisone. BID x 1 week and prn after.  Education on hygiene care, and effects of moisture and friction provided. Encouraged light clothing inside the home and airing out as much as it is possible.  Dementia: Advanced. Memory loss and confusion in line with Dementia disease trajectory. FAST 7b. Total care, uses stander for  transfers. Continue ongoing supportive care.  Follow up: Palliative care will continue to follow for complex medical decision making, advance care planning, and clarification of goals. Return 6 weeks or prn. Encouraged to call provider sooner with any concerns or worsening symptoms.  CHIEF COMPLAINT: Palliative follow up  HISTORY OF PRESENT ILLNESS:  Sabrina Page a 87 y.o. female with multiple medical problems, recurring, Intertrigo with associated mild erythema, under her breasts and armpit, impairing her comfort, worse with warm weather and sweating.  No open wound, no bleeding. Patient with impaired communication due to Dementia. Caregiver reports it is uncomfortable for patient and that she uses clean paper towel to absorb the moisture and this is helpful, no chills or fever. Patient denies pain/discomfort, no fever or chills. History of  advanced dementia, COPD/asthma , chronic diastolic CHF, UTI, obesity, HTN, hyperlipidemia, sacral pressure ulcer - resolved.  History obtained from review of EMR, discussion with primary team, family and/or patient. Records reviewed and summarized above. All 10 point systems reviewed and are negative except as documented in history of present illness above  Review and summarization of Epic records shows history from other than patient.   Palliative Care was asked to follow this patient o help address complex decision making in the context of advance care planning and goals of care clarification.   PHYSICAL EXAM  Height/Weight 5 feet 4 inches/ 233Ibs  General: In no acute distress, appropriately dressed, FLACC 0 Cardiovascular: regular rate and rhythm; no edema in BLE Pulmonary: no cough, no increased work of breathing, normal respiratory effort Abdomen: soft, non tender, no guarding, positive bowel sounds in all quadrants GU:  no suprapubic tenderness Eyes: Normal lids, no discharge ENMT: Moist mucous membranes Musculoskeletal:  weakness,  compression hose to BLE, max assist for transfers, non ambulatory. Skin: redness under breasts and armpits. No open wound, no fissures, no odor, no exudates Psych: non-anxious affect Neurological: Weakness but otherwise non focal Heme/lymph/immuno: no bruises, no bleeding  PERTINENT MEDICATIONS:  Outpatient Encounter Medications as of 04/20/2021  Medication Sig   acetaminophen (TYLENOL) 500 MG tablet Take 500 mg by mouth every 6 (six) hours as needed for headache (pain).   acetaminophen (TYLENOL) 500 MG tablet Take 1 tablet (500 mg total) by mouth every 6 (six) hours as needed for mild pain or moderate pain.   ARIPiprazole (ABILIFY) 2 MG tablet TAKE 1 TABLET BY MOUTH EVERY DAY (Patient taking differently: Take 2 mg by mouth daily.)   ARIPiprazole (ABILIFY) 2 MG tablet Take 2 mg by mouth daily.   aspirin 81 MG EC tablet Take 1 tablet (81 mg total) by mouth 2 (two) times daily.   ASPIRIN 81 PO Take 1 tablet by mouth 2 (two) times daily.   aspirin EC 81 MG tablet Take 1 tablet (81 mg total) by mouth 2 (two) times daily. For DVT prophylaxis for 30 days after surgery.   B Complex-Folic Acid (B COMPLEX PLUS) TABS Take 1 tablet by mouth daily.   bisacodyl (DULCOLAX) 10 MG suppository Place 1 suppository (10 mg total) rectally daily as needed for moderate constipation.   Calcium-Vitamin D-Vitamin K 500-200-90 MG-UNT-MCG TABS 1 po qd (Patient taking differently: Take 1 tablet by mouth daily.)   Cholecalciferol (VITAMIN D) 125 MCG (5000 UT) CAPS Take 1 capsule by mouth daily.   docusate sodium (STOOL SOFTENER) 100 MG capsule Take 1 capsule (100 mg total) by mouth 2 (two) times daily. (Patient taking differently: Take 100 mg by mouth every other day.)   fludrocortisone (FLORINEF) 0.1 MG tablet Take 1 tablet (100 mcg total) by mouth daily. Annual appt due in Oct must see provider for future refills (Patient taking differently: Take 100 mcg by mouth daily.)   fludrocortisone (FLORINEF) 0.1 MG tablet Take  0.1 mg by mouth daily. PLEASE SEE ATTACHED FOR DETAILED DIRECTIONS   FLUoxetine (PROZAC) 40 MG capsule TAKE 1 CAPSULE BY MOUTH EVERY DAY (Patient taking differently: Take 40 mg by mouth daily.)   FLUoxetine (PROZAC) 40 MG capsule Take 40 mg by mouth daily.   furosemide (LASIX) 20 MG tablet Take 1 tablet (20 mg total) by mouth every other day.   furosemide (LASIX) 20 MG tablet Take 20 mg by mouth every other day.   HYDROcodone-acetaminophen (NORCO) 5-325 MG tablet Take 1-2 tablets by mouth every 6 (six) hours as needed for moderate pain. MAXIMUM TOTAL ACETAMINOPHEN DOSE IS 4000 MG PER DAY   loratadine (CLARITIN) 10 MG tablet TAKE 1 TABLET BY MOUTH EVERY DAY IN THE MORNING (Patient taking differently: Take 10 mg by mouth daily.)   loratadine (CLARITIN) 10 MG tablet Take 10 mg by mouth every morning.   memantine (NAMENDA) 10 MG tablet Take 1 tablet (10 mg total) by mouth 2 (two) times daily. Annual appt due in Oct must see provider for future refills   memantine (NAMENDA) 10 MG tablet Take 10 mg by mouth 2 (two) times daily.   methocarbamol (ROBAXIN) 500 MG tablet Take 1 tablet (500 mg total) by mouth every 6 (six) hours as needed for muscle spasms.   midodrine (PROAMATINE) 10 MG tablet Take 0.5 tablets (5 mg total) by mouth 3 (three) times daily as  needed (For SBP less than 110). May also take an additional 5mg  if BP gets too low   midodrine (PROAMATINE) 10 MG tablet Take 5 mg by mouth 2 (two) times daily.   Multiple Vitamins-Minerals (ZINC PO) Take 1 tablet by mouth daily.   nystatin cream (MYCOSTATIN) Apply 1 application topically daily as needed for dry skin (rash/irritation/itching).   nystatin cream (MYCOSTATIN) Apply 1 application topically daily as needed.   omeprazole (PRILOSEC) 20 MG capsule Take 2 capsules (40 mg total) by mouth daily.   omeprazole (PRILOSEC) 20 MG capsule Take 40 mg by mouth daily.   OVER THE COUNTER MEDICATION Apply 1 application topically daily as needed (knee pain).  Hemp Freeze   polyethylene glycol (MIRALAX / GLYCOLAX) 17 g packet Take 17 g by mouth daily as needed for mild constipation.   potassium chloride (KLOR-CON) 8 MEQ tablet Take 1 tablet (8 mEq total) by mouth every morning.   potassium chloride (KLOR-CON) 8 MEQ tablet Take 8 mEq by mouth every morning.   pyridostigmine (MESTINON) 60 MG tablet Take 1 tablet (60 mg total) by mouth 3 (three) times daily.   pyridostigmine (MESTINON) 60 MG tablet Take 60 mg by mouth 3 (three) times daily.   senna-docusate (SENOKOT-S) 8.6-50 MG tablet Take 1 tablet by mouth 2 (two) times daily.   traMADol (ULTRAM) 50 MG tablet Take 1 tablet (50 mg total) by mouth every 12 (twelve) hours as needed for severe pain.   No facility-administered encounter medications on file as of 04/20/2021.    HOSPICE ELIGIBILITY/DIAGNOSIS: TBD  PAST MEDICAL HISTORY:  Past Medical History:  Diagnosis Date   Allergy    rhinitis   Anxiety with depression 02/06/2021   Asthma    Chronic diastolic heart failure ([place]) 02/06/2021   Chronic kidney disease    hx of cystitis    COPD (chronic obstructive pulmonary disease) (HCC)    COPD (chronic obstructive pulmonary disease) ([place]) 02/06/2021   COVID-19 virus infection 02/06/2021   Dementia due to Alzheimer's disease ([place]) 02/06/2021   Depression    Dizziness    vertigo    Gallstones    GERD (gastroesophageal reflux disease)    GERD (gastroesophageal reflux disease) 02/06/2021   Hyperlipidemia    Hypertension    Obesity    Orthostatic hypotension 02/06/2021   Osteoarthritis    Osteoporosis    Peripheral vascular disease (HCC)    swelling    Pneumonia    Shortness of breath    with exertion    Shoulder injury    left   Sleep apnea    never diagnosed    Urinary incontinence      ALLERGIES:  Allergies  Allergen Reactions   Atorvastatin Other (See Comments)    REACTION: upset stomach   Enalapril Maleate Other (See Comments)    Unknown reaction   Hctz [Hydrochlorothiazide]  Other (See Comments)    dizziness   Lovastatin Other (See Comments)    Unknown reaction   Namenda [Memantine Hcl]     Feeling bad   Simvastatin Other (See Comments)    REACTION: mouth sores   Topamax [Topiramate] Other (See Comments)    syncope      I spent 60 minutes providing this consultation; this includes time spent with patient/family, chart review and documentation. More than 50% of the time in this consultation was spent on counseling and coordinating communication   Thank you for the opportunity to participate in the care of [place] Please call our office at  [phone] if we can be of additional assistance.  Note: Portions of this note were generated with [employer]. Dictation errors may occur despite best attempts at proofreading.  Teodoro Spray, NP

## 2021-04-21 ENCOUNTER — Telehealth: Payer: Self-pay

## 2021-04-21 DIAGNOSIS — F0284 Dementia in other diseases classified elsewhere, unspecified severity, with anxiety: Secondary | ICD-10-CM | POA: Diagnosis not present

## 2021-04-21 DIAGNOSIS — G309 Alzheimer's disease, unspecified: Secondary | ICD-10-CM | POA: Diagnosis not present

## 2021-04-21 DIAGNOSIS — I110 Hypertensive heart disease with heart failure: Secondary | ICD-10-CM | POA: Diagnosis not present

## 2021-04-21 DIAGNOSIS — I5042 Chronic combined systolic (congestive) and diastolic (congestive) heart failure: Secondary | ICD-10-CM | POA: Diagnosis not present

## 2021-04-21 DIAGNOSIS — J449 Chronic obstructive pulmonary disease, unspecified: Secondary | ICD-10-CM | POA: Diagnosis not present

## 2021-04-21 DIAGNOSIS — S72401D Unspecified fracture of lower end of right femur, subsequent encounter for closed fracture with routine healing: Secondary | ICD-10-CM | POA: Diagnosis not present

## 2021-04-21 NOTE — Telephone Encounter (Signed)
Received message to call Jim Like, with [facility]. Hinton Dyer wanted to update [place] NP that she also observed the rash under patient's breasts and PCP added to [facility] orders for RN to monitor wound/rash.

## 2021-04-29 DIAGNOSIS — F0284 Dementia in other diseases classified elsewhere, unspecified severity, with anxiety: Secondary | ICD-10-CM | POA: Diagnosis not present

## 2021-04-29 DIAGNOSIS — I5042 Chronic combined systolic (congestive) and diastolic (congestive) heart failure: Secondary | ICD-10-CM | POA: Diagnosis not present

## 2021-04-29 DIAGNOSIS — J449 Chronic obstructive pulmonary disease, unspecified: Secondary | ICD-10-CM | POA: Diagnosis not present

## 2021-04-29 DIAGNOSIS — I110 Hypertensive heart disease with heart failure: Secondary | ICD-10-CM | POA: Diagnosis not present

## 2021-04-29 DIAGNOSIS — S72401D Unspecified fracture of lower end of right femur, subsequent encounter for closed fracture with routine healing: Secondary | ICD-10-CM | POA: Diagnosis not present

## 2021-04-29 DIAGNOSIS — G309 Alzheimer's disease, unspecified: Secondary | ICD-10-CM | POA: Diagnosis not present

## 2021-05-01 DIAGNOSIS — F0284 Dementia in other diseases classified elsewhere, unspecified severity, with anxiety: Secondary | ICD-10-CM | POA: Diagnosis not present

## 2021-05-01 DIAGNOSIS — I110 Hypertensive heart disease with heart failure: Secondary | ICD-10-CM | POA: Diagnosis not present

## 2021-05-01 DIAGNOSIS — S72401D Unspecified fracture of lower end of right femur, subsequent encounter for closed fracture with routine healing: Secondary | ICD-10-CM | POA: Diagnosis not present

## 2021-05-01 DIAGNOSIS — I5042 Chronic combined systolic (congestive) and diastolic (congestive) heart failure: Secondary | ICD-10-CM | POA: Diagnosis not present

## 2021-05-01 DIAGNOSIS — G309 Alzheimer's disease, unspecified: Secondary | ICD-10-CM | POA: Diagnosis not present

## 2021-05-01 DIAGNOSIS — J449 Chronic obstructive pulmonary disease, unspecified: Secondary | ICD-10-CM | POA: Diagnosis not present

## 2021-05-06 DIAGNOSIS — I110 Hypertensive heart disease with heart failure: Secondary | ICD-10-CM | POA: Diagnosis not present

## 2021-05-06 DIAGNOSIS — I5042 Chronic combined systolic (congestive) and diastolic (congestive) heart failure: Secondary | ICD-10-CM | POA: Diagnosis not present

## 2021-05-06 DIAGNOSIS — F0284 Dementia in other diseases classified elsewhere, unspecified severity, with anxiety: Secondary | ICD-10-CM | POA: Diagnosis not present

## 2021-05-06 DIAGNOSIS — J449 Chronic obstructive pulmonary disease, unspecified: Secondary | ICD-10-CM | POA: Diagnosis not present

## 2021-05-06 DIAGNOSIS — G309 Alzheimer's disease, unspecified: Secondary | ICD-10-CM | POA: Diagnosis not present

## 2021-05-06 DIAGNOSIS — S72401D Unspecified fracture of lower end of right femur, subsequent encounter for closed fracture with routine healing: Secondary | ICD-10-CM | POA: Diagnosis not present

## 2021-05-08 DIAGNOSIS — F0284 Dementia in other diseases classified elsewhere, unspecified severity, with anxiety: Secondary | ICD-10-CM | POA: Diagnosis not present

## 2021-05-08 DIAGNOSIS — J449 Chronic obstructive pulmonary disease, unspecified: Secondary | ICD-10-CM | POA: Diagnosis not present

## 2021-05-08 DIAGNOSIS — I5042 Chronic combined systolic (congestive) and diastolic (congestive) heart failure: Secondary | ICD-10-CM | POA: Diagnosis not present

## 2021-05-08 DIAGNOSIS — S72401D Unspecified fracture of lower end of right femur, subsequent encounter for closed fracture with routine healing: Secondary | ICD-10-CM | POA: Diagnosis not present

## 2021-05-08 DIAGNOSIS — I110 Hypertensive heart disease with heart failure: Secondary | ICD-10-CM | POA: Diagnosis not present

## 2021-05-08 DIAGNOSIS — G309 Alzheimer's disease, unspecified: Secondary | ICD-10-CM | POA: Diagnosis not present

## 2021-05-13 ENCOUNTER — Other Ambulatory Visit: Payer: Self-pay

## 2021-05-13 ENCOUNTER — Telehealth: Payer: Self-pay | Admitting: Internal Medicine

## 2021-05-13 DIAGNOSIS — S72401D Unspecified fracture of lower end of right femur, subsequent encounter for closed fracture with routine healing: Secondary | ICD-10-CM | POA: Diagnosis not present

## 2021-05-13 DIAGNOSIS — F0284 Dementia in other diseases classified elsewhere, unspecified severity, with anxiety: Secondary | ICD-10-CM | POA: Diagnosis not present

## 2021-05-13 DIAGNOSIS — J449 Chronic obstructive pulmonary disease, unspecified: Secondary | ICD-10-CM | POA: Diagnosis not present

## 2021-05-13 DIAGNOSIS — I5042 Chronic combined systolic (congestive) and diastolic (congestive) heart failure: Secondary | ICD-10-CM | POA: Diagnosis not present

## 2021-05-13 DIAGNOSIS — I110 Hypertensive heart disease with heart failure: Secondary | ICD-10-CM | POA: Diagnosis not present

## 2021-05-13 DIAGNOSIS — G309 Alzheimer's disease, unspecified: Secondary | ICD-10-CM | POA: Diagnosis not present

## 2021-05-13 NOTE — Telephone Encounter (Signed)
Christina from [facility] calling in  Calling to check status of skilled nursing order dated 11.29.22 (also faxed same day)  Please call [phone] ext [phone]

## 2021-05-14 ENCOUNTER — Telehealth (INDEPENDENT_AMBULATORY_CARE_PROVIDER_SITE_OTHER): Payer: Self-pay | Admitting: Internal Medicine

## 2021-05-14 DIAGNOSIS — I110 Hypertensive heart disease with heart failure: Secondary | ICD-10-CM | POA: Diagnosis not present

## 2021-05-14 DIAGNOSIS — K5900 Constipation, unspecified: Secondary | ICD-10-CM | POA: Diagnosis not present

## 2021-05-14 DIAGNOSIS — Z9181 History of falling: Secondary | ICD-10-CM | POA: Diagnosis not present

## 2021-05-14 DIAGNOSIS — F32A Depression, unspecified: Secondary | ICD-10-CM | POA: Diagnosis not present

## 2021-05-14 DIAGNOSIS — M48062 Spinal stenosis, lumbar region with neurogenic claudication: Secondary | ICD-10-CM | POA: Diagnosis not present

## 2021-05-14 DIAGNOSIS — F0284 Dementia in other diseases classified elsewhere, unspecified severity, with anxiety: Secondary | ICD-10-CM | POA: Diagnosis not present

## 2021-05-14 DIAGNOSIS — S72401D Unspecified fracture of lower end of right femur, subsequent encounter for closed fracture with routine healing: Secondary | ICD-10-CM | POA: Diagnosis not present

## 2021-05-14 DIAGNOSIS — I5042 Chronic combined systolic (congestive) and diastolic (congestive) heart failure: Secondary | ICD-10-CM | POA: Diagnosis not present

## 2021-05-14 DIAGNOSIS — Z7982 Long term (current) use of aspirin: Secondary | ICD-10-CM | POA: Diagnosis not present

## 2021-05-14 DIAGNOSIS — G9009 Other idiopathic peripheral autonomic neuropathy: Secondary | ICD-10-CM | POA: Diagnosis not present

## 2021-05-14 DIAGNOSIS — Z8616 Personal history of COVID-19: Secondary | ICD-10-CM | POA: Diagnosis not present

## 2021-05-14 DIAGNOSIS — G309 Alzheimer's disease, unspecified: Secondary | ICD-10-CM | POA: Diagnosis not present

## 2021-05-14 DIAGNOSIS — J449 Chronic obstructive pulmonary disease, unspecified: Secondary | ICD-10-CM | POA: Diagnosis not present

## 2021-05-14 DIAGNOSIS — I951 Orthostatic hypotension: Secondary | ICD-10-CM | POA: Diagnosis not present

## 2021-05-14 DIAGNOSIS — Z993 Dependence on wheelchair: Secondary | ICD-10-CM | POA: Diagnosis not present

## 2021-05-14 DIAGNOSIS — K219 Gastro-esophageal reflux disease without esophagitis: Secondary | ICD-10-CM | POA: Diagnosis not present

## 2021-05-14 DIAGNOSIS — Z7952 Long term (current) use of systemic steroids: Secondary | ICD-10-CM | POA: Diagnosis not present

## 2021-05-14 DIAGNOSIS — Z87440 Personal history of urinary (tract) infections: Secondary | ICD-10-CM | POA: Diagnosis not present

## 2021-05-14 DIAGNOSIS — F0280 Dementia in other diseases classified elsewhere without behavioral disturbance: Secondary | ICD-10-CM

## 2021-05-14 NOTE — Telephone Encounter (Signed)
Orders was faxed. We received confirmation that it was received. Refaxed forms also mailing to [facility].Marland KitchenJohny Chess

## 2021-05-16 DIAGNOSIS — J449 Chronic obstructive pulmonary disease, unspecified: Secondary | ICD-10-CM | POA: Diagnosis not present

## 2021-05-16 DIAGNOSIS — I110 Hypertensive heart disease with heart failure: Secondary | ICD-10-CM | POA: Diagnosis not present

## 2021-05-16 DIAGNOSIS — S72401D Unspecified fracture of lower end of right femur, subsequent encounter for closed fracture with routine healing: Secondary | ICD-10-CM | POA: Diagnosis not present

## 2021-05-16 DIAGNOSIS — F0284 Dementia in other diseases classified elsewhere, unspecified severity, with anxiety: Secondary | ICD-10-CM | POA: Diagnosis not present

## 2021-05-16 DIAGNOSIS — I5042 Chronic combined systolic (congestive) and diastolic (congestive) heart failure: Secondary | ICD-10-CM | POA: Diagnosis not present

## 2021-05-16 DIAGNOSIS — G309 Alzheimer's disease, unspecified: Secondary | ICD-10-CM | POA: Diagnosis not present

## 2021-05-23 DIAGNOSIS — S72401D Unspecified fracture of lower end of right femur, subsequent encounter for closed fracture with routine healing: Secondary | ICD-10-CM | POA: Diagnosis not present

## 2021-05-23 DIAGNOSIS — I5042 Chronic combined systolic (congestive) and diastolic (congestive) heart failure: Secondary | ICD-10-CM | POA: Diagnosis not present

## 2021-05-23 DIAGNOSIS — I110 Hypertensive heart disease with heart failure: Secondary | ICD-10-CM | POA: Diagnosis not present

## 2021-05-23 DIAGNOSIS — F0284 Dementia in other diseases classified elsewhere, unspecified severity, with anxiety: Secondary | ICD-10-CM | POA: Diagnosis not present

## 2021-05-23 DIAGNOSIS — J449 Chronic obstructive pulmonary disease, unspecified: Secondary | ICD-10-CM | POA: Diagnosis not present

## 2021-05-23 DIAGNOSIS — G309 Alzheimer's disease, unspecified: Secondary | ICD-10-CM | POA: Diagnosis not present

## 2021-05-25 NOTE — Progress Notes (Signed)
No show

## 2021-05-26 DIAGNOSIS — F0284 Dementia in other diseases classified elsewhere, unspecified severity, with anxiety: Secondary | ICD-10-CM | POA: Diagnosis not present

## 2021-05-26 DIAGNOSIS — I5042 Chronic combined systolic (congestive) and diastolic (congestive) heart failure: Secondary | ICD-10-CM | POA: Diagnosis not present

## 2021-05-26 DIAGNOSIS — S72401D Unspecified fracture of lower end of right femur, subsequent encounter for closed fracture with routine healing: Secondary | ICD-10-CM | POA: Diagnosis not present

## 2021-05-26 DIAGNOSIS — G309 Alzheimer's disease, unspecified: Secondary | ICD-10-CM | POA: Diagnosis not present

## 2021-05-26 DIAGNOSIS — I110 Hypertensive heart disease with heart failure: Secondary | ICD-10-CM | POA: Diagnosis not present

## 2021-05-26 DIAGNOSIS — J449 Chronic obstructive pulmonary disease, unspecified: Secondary | ICD-10-CM | POA: Diagnosis not present

## 2021-05-29 DIAGNOSIS — F0284 Dementia in other diseases classified elsewhere, unspecified severity, with anxiety: Secondary | ICD-10-CM | POA: Diagnosis not present

## 2021-05-29 DIAGNOSIS — I5042 Chronic combined systolic (congestive) and diastolic (congestive) heart failure: Secondary | ICD-10-CM | POA: Diagnosis not present

## 2021-05-29 DIAGNOSIS — S72401D Unspecified fracture of lower end of right femur, subsequent encounter for closed fracture with routine healing: Secondary | ICD-10-CM | POA: Diagnosis not present

## 2021-05-29 DIAGNOSIS — I110 Hypertensive heart disease with heart failure: Secondary | ICD-10-CM | POA: Diagnosis not present

## 2021-05-29 DIAGNOSIS — G309 Alzheimer's disease, unspecified: Secondary | ICD-10-CM | POA: Diagnosis not present

## 2021-05-29 DIAGNOSIS — J449 Chronic obstructive pulmonary disease, unspecified: Secondary | ICD-10-CM | POA: Diagnosis not present

## 2021-06-01 ENCOUNTER — Encounter: Payer: Self-pay | Admitting: Internal Medicine

## 2021-06-01 ENCOUNTER — Telehealth: Payer: Medicare Other | Admitting: Internal Medicine

## 2021-06-01 ENCOUNTER — Other Ambulatory Visit: Payer: Self-pay

## 2021-06-01 MED ORDER — AMOXICILLIN 500 MG PO CAPS: 500.0000 mg | ORAL_CAPSULE | Freq: Three times a day (TID) | ORAL | 2 refills | Status: DC

## 2021-06-01 MED ORDER — KETOCONAZOLE 200 MG PO TABS: 200.0000 mg | ORAL_TABLET | Freq: Every day | ORAL | 1 refills | Status: AC

## 2021-06-01 MED ORDER — KETOCONAZOLE 2 % EX CREA: 1.0000 "application " | TOPICAL_CREAM | Freq: Every day | CUTANEOUS | 1 refills | Status: AC

## 2021-06-01 NOTE — Progress Notes (Signed)
Virtual Visit via Video Note  I connected with Sabrina Page on 06/01/21 at  8:50 AM EST by a video enabled telemedicine application and verified that I am speaking with the correct person using two identifiers.   I discussed the limitations of evaluation and management by telemedicine and the availability of in person appointments. The patient expressed understanding and agreed to proceed.  I was located at our [facility] office. The patient was at home. There was dtr Sabrina Page present in the visit.  No chief complaint on file.    History of Present Illness: C/o rash under breasts, under arms C/o recurrent UTIs F/u on dementia  ROS   Observations/Objective: The patient appears to be in no acute distress  Assessment and Plan:  Problem List Items Addressed This Visit   None    No orders of the defined types were placed in this encounter.    Follow Up Instructions:    I discussed the assessment and treatment plan with the patient. The patient was provided an opportunity to ask questions and all were answered. The patient agreed with the plan and demonstrated an understanding of the instructions.   The patient was advised to call back or seek an in-person evaluation if the symptoms worsen or if the condition fails to improve as anticipated.  I provided face-to-face time during this encounter. We were at different locations.   [name], MD

## 2021-06-04 ENCOUNTER — Telehealth: Payer: Self-pay

## 2021-06-04 NOTE — Telephone Encounter (Signed)
PA for Ketoconazole 200MG  tablets  Key: [id number]  Your request has been denied

## 2021-06-06 DIAGNOSIS — I110 Hypertensive heart disease with heart failure: Secondary | ICD-10-CM | POA: Diagnosis not present

## 2021-06-06 DIAGNOSIS — I5042 Chronic combined systolic (congestive) and diastolic (congestive) heart failure: Secondary | ICD-10-CM | POA: Diagnosis not present

## 2021-06-06 DIAGNOSIS — J449 Chronic obstructive pulmonary disease, unspecified: Secondary | ICD-10-CM | POA: Diagnosis not present

## 2021-06-06 DIAGNOSIS — S72401D Unspecified fracture of lower end of right femur, subsequent encounter for closed fracture with routine healing: Secondary | ICD-10-CM | POA: Diagnosis not present

## 2021-06-06 DIAGNOSIS — F0284 Dementia in other diseases classified elsewhere, unspecified severity, with anxiety: Secondary | ICD-10-CM | POA: Diagnosis not present

## 2021-06-06 DIAGNOSIS — G309 Alzheimer's disease, unspecified: Secondary | ICD-10-CM | POA: Diagnosis not present

## 2021-06-12 DIAGNOSIS — J449 Chronic obstructive pulmonary disease, unspecified: Secondary | ICD-10-CM | POA: Diagnosis not present

## 2021-06-12 DIAGNOSIS — I5042 Chronic combined systolic (congestive) and diastolic (congestive) heart failure: Secondary | ICD-10-CM | POA: Diagnosis not present

## 2021-06-12 DIAGNOSIS — I110 Hypertensive heart disease with heart failure: Secondary | ICD-10-CM | POA: Diagnosis not present

## 2021-06-12 DIAGNOSIS — S72401D Unspecified fracture of lower end of right femur, subsequent encounter for closed fracture with routine healing: Secondary | ICD-10-CM | POA: Diagnosis not present

## 2021-06-12 DIAGNOSIS — F0284 Dementia in other diseases classified elsewhere, unspecified severity, with anxiety: Secondary | ICD-10-CM | POA: Diagnosis not present

## 2021-06-12 DIAGNOSIS — G309 Alzheimer's disease, unspecified: Secondary | ICD-10-CM | POA: Diagnosis not present

## 2021-06-15 ENCOUNTER — Ambulatory Visit: Payer: Medicare Other | Admitting: Neurology

## 2021-07-01 ENCOUNTER — Other Ambulatory Visit: Payer: Self-pay | Admitting: Internal Medicine

## 2021-07-09 ENCOUNTER — Telehealth: Payer: Self-pay

## 2021-07-09 NOTE — Telephone Encounter (Signed)
Darrick Meigs is call on behave of pt wanting to start [facility] services back up. He is wanting a order for Eval and treat for PT and ST. Darrick Meigs states that he would need a Dx code to support the need for it. Also an office visit in the last 90 days regarding the Dx.  Pt daughter is was going to call to request this service  Call [name] for the verbal order [phone]

## 2021-07-09 NOTE — Telephone Encounter (Signed)
Okay.   Last office visit was on 03/03/2021 Schedule virtual office visit Thanks

## 2021-07-13 ENCOUNTER — Telehealth: Payer: Self-pay | Admitting: Internal Medicine

## 2021-07-13 NOTE — Telephone Encounter (Signed)
[facility] would like to start back home visits - Patient's daughter is requesting this.  A new order will need to be placed.  This can be sent to [facility] or a verbal can be sent to [facility].  They are on Epic if you want to place the order that way - Let Darrick Meigs Know that this have been updated.  Office [phone] Cell:  [phone]

## 2021-07-14 NOTE — Telephone Encounter (Signed)
Okay with me. They need to schedule office visit.  It can be virtual. Thanks AP

## 2021-07-15 NOTE — Telephone Encounter (Signed)
VV has been sched for 07/22/2021.

## 2021-07-16 NOTE — Telephone Encounter (Signed)
Sabrina Page states he will need a new order. He will pull the office note from 07/22/21 once its complete to start [facility] services.  Sabrina Page is in epic and an order can be sent as staff message.

## 2021-07-17 NOTE — Telephone Encounter (Signed)
OK verbal orders as requested Thx

## 2021-07-21 NOTE — Telephone Encounter (Signed)
Spoke with Darrick Meigs and he has stated that verbals have to be given to [name] at [phone]. He has also stated most recent face to face or VV notes are needed to start pt [facility] services.

## 2021-07-22 ENCOUNTER — Telehealth (INDEPENDENT_AMBULATORY_CARE_PROVIDER_SITE_OTHER): Payer: Medicare Other | Admitting: Internal Medicine

## 2021-07-22 ENCOUNTER — Encounter: Payer: Self-pay | Admitting: Internal Medicine

## 2021-07-22 DIAGNOSIS — F0280 Dementia in other diseases classified elsewhere without behavioral disturbance: Secondary | ICD-10-CM | POA: Diagnosis not present

## 2021-07-22 DIAGNOSIS — R296 Repeated falls: Secondary | ICD-10-CM | POA: Diagnosis not present

## 2021-07-22 DIAGNOSIS — R269 Unspecified abnormalities of gait and mobility: Secondary | ICD-10-CM | POA: Diagnosis not present

## 2021-07-22 DIAGNOSIS — G309 Alzheimer's disease, unspecified: Secondary | ICD-10-CM | POA: Diagnosis not present

## 2021-07-22 DIAGNOSIS — I951 Orthostatic hypotension: Secondary | ICD-10-CM

## 2021-07-22 DIAGNOSIS — R531 Weakness: Secondary | ICD-10-CM

## 2021-07-22 NOTE — Assessment & Plan Note (Signed)
Dementia is worse and progressing.  She has severe difficulties with short-term memory.  She has been suffering with worsening apathy.  She has not been able to learn new things.  She has been losing all skills.  She has been not completely dependent on her  Helpers, her daughter for daily routine.  She is wheelchair-bound.  She has not been not able to walk without assistance. ? ?Start PT ?

## 2021-07-22 NOTE — Assessment & Plan Note (Signed)
Cont on Midodrine 10 mg tid - max dose; Prozac, Mestinon ?

## 2021-07-22 NOTE — Telephone Encounter (Signed)
Sabrina Page from [place] calling in ? ?Says he needs PT order to specifically state:  ? ?"home health PT for strengthening, mobility, gait stability, fall risk related to decline & advanced alzheimers diagnoses" on order. ? ?CB if needed [phone] ?

## 2021-07-22 NOTE — Telephone Encounter (Signed)
[facility] w/ [facility] checking status of verbal orders ? ?Caller inquiring if orders can be put in epic along w/ discipline the pt needs based off of today's vv ? ?Caller states he can pull the order from epic or  verbals can be given to [place] as stated below ? ? ?

## 2021-07-22 NOTE — Progress Notes (Signed)
Virtual Visit via Video Note ? ?I connected with Sabrina Page on 07/22/21 at  9:30 AM EST by a video enabled telemedicine application and verified that I am speaking with the correct person using two identifiers. ?  ?I discussed the limitations of evaluation and management by telemedicine and the availability of in person appointments. The patient expressed understanding and agreed to proceed. ? ?I was located at our [facility] office. ?The patient was at home. ?There was dtr [name] present in the visit. ? ?No chief complaint on file. ?  ? ?History of Present Illness: ? ?F/u on dementia, orthostatic hypotension, falls, FTT ? ?Review of Systems  ?Constitutional:  Negative for chills.  ?Respiratory:  Positive for shortness of breath.   ?Genitourinary:  Positive for frequency.  ?Musculoskeletal:  Positive for back pain and falls.  ?Neurological:  Positive for dizziness and weakness. Negative for headaches.  ?Psychiatric/Behavioral:  Positive for memory loss. Negative for depression and suicidal ideas. The patient is not nervous/anxious and does not have insomnia.   ? ? ?Observations/Objective: ?The patient appears to be in no acute distress. ?In a w/c. [place]. ?Assessment and Plan: ? ?Problem List Items Addressed This Visit   ? ? Alzheimer disease ([place])  ?  Dementia is worse and progressing.  She has severe difficulties with short-term memory.  She has been suffering with worsening apathy.  She has not been able to learn new things.  She has been losing all skills.  She has been not completely dependent on her  Helpers, her daughter for daily routine.  She is wheelchair-bound.  She has not been not able to walk without assistance. ? ?Start PT ?  ?  ? Dementia due to Alzheimer's disease ([place])  ?  Worse - apathy. We can d/c Abilify ?  ?  ? Orthostatic hypotension  ?  Better  ?  ?  ? ? ? ?No orders of the defined types were placed in this encounter. ?  ? ?Follow Up Instructions: ? ?  ?I discussed the assessment  and treatment plan with the patient. The patient was provided an opportunity to ask questions and all were answered. The patient agreed with the plan and demonstrated an understanding of the instructions. ?  ?The patient was advised to call back or seek an in-person evaluation if the symptoms worsen or if the condition fails to improve as anticipated. ? ?I provided face-to-face time during this encounter. We were at different locations. ? ? ?[name], MD ? ?

## 2021-07-22 NOTE — Assessment & Plan Note (Signed)
Better  

## 2021-07-22 NOTE — Assessment & Plan Note (Signed)
Worse - apathy. We can d/c Abilify ?

## 2021-07-23 NOTE — Telephone Encounter (Signed)
Sabrina Page visits today in regards to PT. Now that PT has had their virtual visit he would like to get her [place] order in. Order needs to state that that PT will have physical therapy to help strengthening, gain balance, transferring, mobility, d/t deconditioning, and alzheimer onset disease. Order can be faxed to [facility] or [name] can take a verbal! Darrick Meigs is also on Epic if you would like to upload it that way! ?

## 2021-07-23 NOTE — Telephone Encounter (Signed)
Okay.  I finished the note.  Thank you ?

## 2021-07-24 NOTE — Telephone Encounter (Signed)
Spoke with [name] and was able to give her the Verbal ok for PT and will fax over the office notes to Christian. ?

## 2021-07-27 NOTE — Addendum Note (Signed)
Addended by: Marijean Heath R on: 07/27/2021 09:12 AM ? ? Modules accepted: Orders ? ?

## 2021-07-30 ENCOUNTER — Inpatient Hospital Stay (HOSPITAL_COMMUNITY)
Admission: EM | Admit: 2021-07-30 | Discharge: 2021-08-05 | DRG: 488 | Disposition: A | Discharge status: Home Health | Payer: Medicare Other | Attending: Internal Medicine | Admitting: Internal Medicine

## 2021-07-30 ENCOUNTER — Emergency Department (HOSPITAL_COMMUNITY): Payer: Medicare Other

## 2021-07-30 ENCOUNTER — Other Ambulatory Visit: Payer: Self-pay

## 2021-07-30 ENCOUNTER — Encounter (HOSPITAL_COMMUNITY): Payer: Self-pay

## 2021-07-30 DIAGNOSIS — S82101A Unspecified fracture of upper end of right tibia, initial encounter for closed fracture: Secondary | ICD-10-CM | POA: Diagnosis not present

## 2021-07-30 DIAGNOSIS — I5032 Chronic diastolic (congestive) heart failure: Secondary | ICD-10-CM | POA: Diagnosis present

## 2021-07-30 DIAGNOSIS — Z87891 Personal history of nicotine dependence: Secondary | ICD-10-CM

## 2021-07-30 DIAGNOSIS — I110 Hypertensive heart disease with heart failure: Secondary | ICD-10-CM | POA: Diagnosis not present

## 2021-07-30 DIAGNOSIS — W1830XA Fall on same level, unspecified, initial encounter: Secondary | ICD-10-CM | POA: Diagnosis present

## 2021-07-30 DIAGNOSIS — Z823 Family history of stroke: Secondary | ICD-10-CM

## 2021-07-30 DIAGNOSIS — E785 Hyperlipidemia, unspecified: Secondary | ICD-10-CM | POA: Diagnosis present

## 2021-07-30 DIAGNOSIS — F418 Other specified anxiety disorders: Secondary | ICD-10-CM | POA: Diagnosis present

## 2021-07-30 DIAGNOSIS — N390 Urinary tract infection, site not specified: Secondary | ICD-10-CM | POA: Diagnosis present

## 2021-07-30 DIAGNOSIS — B9620 Unspecified Escherichia coli [E. coli] as the cause of diseases classified elsewhere: Secondary | ICD-10-CM | POA: Diagnosis present

## 2021-07-30 DIAGNOSIS — E669 Obesity, unspecified: Secondary | ICD-10-CM | POA: Diagnosis present

## 2021-07-30 DIAGNOSIS — E875 Hyperkalemia: Secondary | ICD-10-CM | POA: Diagnosis present

## 2021-07-30 DIAGNOSIS — S82141A Displaced bicondylar fracture of right tibia, initial encounter for closed fracture: Secondary | ICD-10-CM

## 2021-07-30 DIAGNOSIS — I10 Essential (primary) hypertension: Secondary | ICD-10-CM | POA: Diagnosis not present

## 2021-07-30 DIAGNOSIS — R457 State of emotional shock and stress, unspecified: Secondary | ICD-10-CM | POA: Diagnosis not present

## 2021-07-30 DIAGNOSIS — Z96641 Presence of right artificial hip joint: Secondary | ICD-10-CM | POA: Diagnosis present

## 2021-07-30 DIAGNOSIS — F0280 Dementia in other diseases classified elsewhere without behavioral disturbance: Secondary | ICD-10-CM | POA: Diagnosis present

## 2021-07-30 DIAGNOSIS — Z20822 Contact with and (suspected) exposure to covid-19: Secondary | ICD-10-CM | POA: Diagnosis present

## 2021-07-30 DIAGNOSIS — I951 Orthostatic hypotension: Secondary | ICD-10-CM | POA: Diagnosis present

## 2021-07-30 DIAGNOSIS — Z043 Encounter for examination and observation following other accident: Secondary | ICD-10-CM | POA: Diagnosis not present

## 2021-07-30 DIAGNOSIS — I739 Peripheral vascular disease, unspecified: Secondary | ICD-10-CM | POA: Diagnosis not present

## 2021-07-30 DIAGNOSIS — K219 Gastro-esophageal reflux disease without esophagitis: Secondary | ICD-10-CM | POA: Diagnosis present

## 2021-07-30 DIAGNOSIS — Z8249 Family history of ischemic heart disease and other diseases of the circulatory system: Secondary | ICD-10-CM

## 2021-07-30 DIAGNOSIS — Z9889 Other specified postprocedural states: Secondary | ICD-10-CM | POA: Diagnosis not present

## 2021-07-30 DIAGNOSIS — J449 Chronic obstructive pulmonary disease, unspecified: Secondary | ICD-10-CM | POA: Diagnosis not present

## 2021-07-30 DIAGNOSIS — S82831A Other fracture of upper and lower end of right fibula, initial encounter for closed fracture: Secondary | ICD-10-CM | POA: Diagnosis not present

## 2021-07-30 DIAGNOSIS — S82454A Nondisplaced comminuted fracture of shaft of right fibula, initial encounter for closed fracture: Secondary | ICD-10-CM | POA: Diagnosis not present

## 2021-07-30 DIAGNOSIS — G309 Alzheimer's disease, unspecified: Secondary | ICD-10-CM | POA: Diagnosis present

## 2021-07-30 DIAGNOSIS — Z96652 Presence of left artificial knee joint: Secondary | ICD-10-CM | POA: Diagnosis present

## 2021-07-30 DIAGNOSIS — S82144A Nondisplaced bicondylar fracture of right tibia, initial encounter for closed fracture: Secondary | ICD-10-CM | POA: Diagnosis present

## 2021-07-30 DIAGNOSIS — F0284 Dementia in other diseases classified elsewhere, unspecified severity, with anxiety: Secondary | ICD-10-CM | POA: Diagnosis present

## 2021-07-30 DIAGNOSIS — D696 Thrombocytopenia, unspecified: Secondary | ICD-10-CM | POA: Diagnosis present

## 2021-07-30 DIAGNOSIS — Z6836 Body mass index (BMI) 36.0-36.9, adult: Secondary | ICD-10-CM | POA: Diagnosis not present

## 2021-07-30 DIAGNOSIS — S82121A Displaced fracture of lateral condyle of right tibia, initial encounter for closed fracture: Secondary | ICD-10-CM | POA: Diagnosis not present

## 2021-07-30 DIAGNOSIS — S83241A Other tear of medial meniscus, current injury, right knee, initial encounter: Secondary | ICD-10-CM | POA: Diagnosis not present

## 2021-07-30 DIAGNOSIS — G4733 Obstructive sleep apnea (adult) (pediatric): Secondary | ICD-10-CM | POA: Diagnosis present

## 2021-07-30 DIAGNOSIS — Z7401 Bed confinement status: Secondary | ICD-10-CM | POA: Diagnosis not present

## 2021-07-30 DIAGNOSIS — W19XXXA Unspecified fall, initial encounter: Principal | ICD-10-CM

## 2021-07-30 DIAGNOSIS — S82201A Unspecified fracture of shaft of right tibia, initial encounter for closed fracture: Secondary | ICD-10-CM | POA: Diagnosis present

## 2021-07-30 DIAGNOSIS — F0283 Dementia in other diseases classified elsewhere, unspecified severity, with mood disturbance: Secondary | ICD-10-CM | POA: Diagnosis present

## 2021-07-30 DIAGNOSIS — F32A Depression, unspecified: Secondary | ICD-10-CM | POA: Diagnosis not present

## 2021-07-30 DIAGNOSIS — R0789 Other chest pain: Secondary | ICD-10-CM | POA: Diagnosis not present

## 2021-07-30 DIAGNOSIS — S82401A Unspecified fracture of shaft of right fibula, initial encounter for closed fracture: Secondary | ICD-10-CM | POA: Diagnosis not present

## 2021-07-30 DIAGNOSIS — M79604 Pain in right leg: Secondary | ICD-10-CM | POA: Diagnosis not present

## 2021-07-30 DIAGNOSIS — Z419 Encounter for procedure for purposes other than remedying health state, unspecified: Secondary | ICD-10-CM

## 2021-07-30 DIAGNOSIS — R0602 Shortness of breath: Secondary | ICD-10-CM

## 2021-07-30 DIAGNOSIS — R5381 Other malaise: Secondary | ICD-10-CM | POA: Diagnosis not present

## 2021-07-30 DIAGNOSIS — S8991XA Unspecified injury of right lower leg, initial encounter: Secondary | ICD-10-CM | POA: Diagnosis not present

## 2021-07-30 DIAGNOSIS — Z833 Family history of diabetes mellitus: Secondary | ICD-10-CM

## 2021-07-30 DIAGNOSIS — I509 Heart failure, unspecified: Secondary | ICD-10-CM | POA: Diagnosis not present

## 2021-07-30 DIAGNOSIS — Z8616 Personal history of COVID-19: Secondary | ICD-10-CM | POA: Diagnosis not present

## 2021-07-30 DIAGNOSIS — M25561 Pain in right knee: Secondary | ICD-10-CM | POA: Diagnosis not present

## 2021-07-30 LAB — BASIC METABOLIC PANEL
Anion gap: 6 (ref 5–15)
BUN: 11 mg/dL (ref 8–23)
CO2: 30 mmol/L (ref 22–32)
Calcium: 8.9 mg/dL (ref 8.9–10.3)
Chloride: 101 mmol/L (ref 98–111)
Creatinine, Ser: 0.6 mg/dL (ref 0.44–1.00)
GFR, Estimated: >60 mL/min (ref >60)
Glucose, Bld: 105 mg/dL — ABNORMAL HIGH (ref 70–99)
Potassium: 5.6 mmol/L — ABNORMAL HIGH (ref 3.5–5.1)
Sodium: 137 mmol/L (ref 135–145)

## 2021-07-30 LAB — URINALYSIS, ROUTINE W REFLEX MICROSCOPIC
Bilirubin Urine: NEGATIVE
Glucose, UA: NEGATIVE mg/dL
Hgb urine dipstick: NEGATIVE
Ketones, ur: 5 mg/dL — AB
Nitrite: NEGATIVE
Protein, ur: 30 mg/dL — AB
Specific Gravity, Urine: 1.025 (ref 1.005–1.030)
WBC, UA: >50 WBC/hpf — ABNORMAL HIGH (ref 0–5)
pH: 5 (ref 5.0–8.0)

## 2021-07-30 LAB — CBC WITH DIFFERENTIAL/PLATELET
Abs Immature Granulocytes: 0.05 10*3/uL (ref 0.00–0.07)
Basophils Absolute: 0.1 10*3/uL (ref 0.0–0.1)
Basophils Relative: 1 %
Eosinophils Absolute: 0 10*3/uL (ref 0.0–0.5)
Eosinophils Relative: 0 %
HCT: 44.9 % (ref 36.0–46.0)
Hemoglobin: 14.1 g/dL (ref 12.0–15.0)
Immature Granulocytes: 1 %
Lymphocytes Relative: 13 %
Lymphs Abs: 1.3 10*3/uL (ref 0.7–4.0)
MCH: 28.7 pg (ref 26.0–34.0)
MCHC: 31.4 g/dL (ref 30.0–36.0)
MCV: 91.4 fL (ref 80.0–100.0)
Monocytes Absolute: 0.6 10*3/uL (ref 0.1–1.0)
Monocytes Relative: 6 %
Neutro Abs: 7.8 10*3/uL — ABNORMAL HIGH (ref 1.7–7.7)
Neutrophils Relative %: 79 %
Platelets: 157 10*3/uL (ref 150–400)
RBC: 4.91 MIL/uL (ref 3.87–5.11)
RDW: 13.8 % (ref 11.5–15.5)
WBC: 9.8 10*3/uL (ref 4.0–10.5)
nRBC: 0 % (ref 0.0–0.2)

## 2021-07-30 LAB — RESP PANEL BY RT-PCR (FLU A&B, COVID) ARPGX2
Influenza A by PCR: NEGATIVE
Influenza B by PCR: NEGATIVE
SARS Coronavirus 2 by RT PCR: NEGATIVE

## 2021-07-30 LAB — I-STAT CHEM 8, ED
BUN: 10 mg/dL (ref 8–23)
Calcium, Ion: 1.13 mmol/L — ABNORMAL LOW (ref 1.15–1.40)
Chloride: 112 mmol/L — ABNORMAL HIGH (ref 98–111)
Creatinine, Ser: 0.8 mg/dL (ref 0.44–1.00)
Glucose, Bld: 119 mg/dL — ABNORMAL HIGH (ref 70–99)
HCT: 40 % (ref 36.0–46.0)
Hemoglobin: 13.6 g/dL (ref 12.0–15.0)
Potassium: 4.1 mmol/L (ref 3.5–5.1)
Sodium: 141 mmol/L (ref 135–145)
TCO2: 29 mmol/L (ref 22–32)

## 2021-07-30 MED ORDER — POTASSIUM CHLORIDE ER 10 MEQ PO TBCR
10.0000 meq | EXTENDED_RELEASE_TABLET | Freq: Every morning | ORAL | Status: DC
  Administered 2021-07-31: 10 meq via ORAL
  Filled 2021-07-30 (×2): qty 1

## 2021-07-30 MED ORDER — ACETAMINOPHEN 325 MG PO TABS
650.0000 mg | ORAL_TABLET | Freq: Once | ORAL | Status: AC
  Administered 2021-07-30: 12:00:00 650 mg via ORAL
  Filled 2021-07-30: qty 2

## 2021-07-30 MED ORDER — MEMANTINE HCL 10 MG PO TABS
10.0000 mg | ORAL_TABLET | Freq: Two times a day (BID) | ORAL | Status: DC
Start: 2021-07-30 — End: 2021-08-05
  Administered 2021-07-30 – 2021-08-04 (×11): 10 mg via ORAL
  Filled 2021-07-30 (×2): qty 1
  Filled 2021-07-30 (×2): qty 2
  Filled 2021-07-30 (×7): qty 1

## 2021-07-30 MED ORDER — FLUOXETINE HCL 20 MG PO CAPS
40.0000 mg | ORAL_CAPSULE | Freq: Every day | ORAL | Status: DC
  Administered 2021-07-30 – 2021-08-04 (×6): 40 mg via ORAL
  Filled 2021-07-30 (×6): qty 2

## 2021-07-30 MED ORDER — SODIUM CHLORIDE 0.9 % IV BOLUS
1000.0000 mL | Freq: Once | INTRAVENOUS | Status: AC
  Administered 2021-07-30: 16:00:00 1000 mL via INTRAVENOUS

## 2021-07-30 MED ORDER — PYRIDOSTIGMINE BROMIDE 60 MG PO TABS
60.0000 mg | ORAL_TABLET | Freq: Three times a day (TID) | ORAL | Status: DC
Start: 2021-07-30 — End: 2021-08-05
  Administered 2021-07-30 – 2021-08-04 (×15): 60 mg via ORAL
  Filled 2021-07-30 (×16): qty 1

## 2021-07-30 MED ORDER — TRAMADOL HCL 50 MG PO TABS
50.0000 mg | ORAL_TABLET | Freq: Once | ORAL | Status: AC
  Administered 2021-07-30: 12:00:00 50 mg via ORAL
  Filled 2021-07-30: qty 1

## 2021-07-30 MED ORDER — FUROSEMIDE 20 MG PO TABS
20.0000 mg | ORAL_TABLET | ORAL | Status: DC
Start: 2021-07-31 — End: 2021-08-01
  Administered 2021-07-31: 20 mg via ORAL
  Filled 2021-07-30: qty 1

## 2021-07-30 MED ORDER — HYDROCODONE-ACETAMINOPHEN 5-325 MG PO TABS
1.0000 | ORAL_TABLET | Freq: Once | ORAL | Status: AC
  Administered 2021-07-30: 18:00:00 1 via ORAL
  Filled 2021-07-30: qty 1

## 2021-07-30 MED ORDER — METHOCARBAMOL 500 MG PO TABS
500.0000 mg | ORAL_TABLET | Freq: Four times a day (QID) | ORAL | Status: DC | PRN
  Administered 2021-07-30 – 2021-07-31 (×2): 500 mg via ORAL
  Filled 2021-07-30 (×2): qty 1

## 2021-07-30 MED ORDER — FUROSEMIDE 40 MG PO TABS
20.0000 mg | ORAL_TABLET | ORAL | Status: DC
Start: 2021-07-30 — End: 2021-07-30

## 2021-07-30 MED ORDER — PANTOPRAZOLE SODIUM 40 MG PO TBEC
40.0000 mg | DELAYED_RELEASE_TABLET | Freq: Every day | ORAL | Status: DC
  Administered 2021-07-30 – 2021-08-04 (×6): 40 mg via ORAL
  Filled 2021-07-30 (×6): qty 1

## 2021-07-30 MED ORDER — HYDROCODONE-ACETAMINOPHEN 5-325 MG PO TABS
1.0000 | ORAL_TABLET | Freq: Four times a day (QID) | ORAL | Status: DC | PRN
  Administered 2021-07-31: 1 via ORAL
  Filled 2021-07-30: qty 1

## 2021-07-30 MED ORDER — FLUDROCORTISONE ACETATE 0.1 MG PO TABS
100.0000 ug | ORAL_TABLET | Freq: Every day | ORAL | Status: DC
  Administered 2021-07-30 – 2021-08-01 (×3): 100 ug via ORAL
  Filled 2021-07-30 (×3): qty 1

## 2021-07-30 MED ORDER — MIDODRINE HCL 5 MG PO TABS
5.0000 mg | ORAL_TABLET | Freq: Three times a day (TID) | ORAL | Status: DC | PRN
Start: 2021-07-30 — End: 2021-08-01

## 2021-07-30 MED ORDER — TRAMADOL HCL 50 MG PO TABS: 50.0000 mg | ORAL_TABLET | Freq: Four times a day (QID) | ORAL | 0 refills | Status: DC | PRN

## 2021-07-30 NOTE — Progress Notes (Addendum)
Per [name] with [facility], this patient's PCP has referred patient to [facility]. Patient is scheduled for admission to [facility] on 07/31/21. Patient's daughter is aware. ?

## 2021-07-30 NOTE — ED Notes (Signed)
Urine sample was sent from [place].  ?
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
## 2021-07-30 NOTE — Progress Notes (Signed)
Kathreen Cornfield, BSN, RN, [place], [place] [phone] ?RNCM spoke with pt at bedside regarding discharge planning for Sabrina Page. Offered pt medicare.gov list of home health agencies to choose from.  Pt chose [facility] to render services. Jennifer of [facility] notified. Patient made aware that [facility] will be in contact in 24-48 hours.  No DME needs identified at this time.  ?Patient has the following DME: hospital bed, chair lift, recliner,stand up lift and air mattress. ?

## 2021-07-30 NOTE — ED Notes (Addendum)
Pt moved over to hospital bed for comfort.  Changed pt into hospital gown and positioned for comfort.  Pt made about 150cc foul smelling, dark concentrated urine in purewick, sent to lab.  EDP states that if UTI suspected we need I&O cath.  Gave pt cup of water and encouraged po intake. ?
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
## 2021-07-30 NOTE — ED Notes (Signed)
Hospital Bed ordered '@2145'$  they gave 30 min estimated time  ?
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
## 2021-07-30 NOTE — ED Notes (Signed)
Placed recliner chair in room for family members comfort. ?

## 2021-07-30 NOTE — ED Notes (Signed)
Applied 2l [place] as pt spo2 as pt spo2 was 88% on RA and pt is on 2L [place] as needed at home.  Daughter is at the bedside and she would like pt admitted and for pt to see orthopedic surgery for repair as she is unsure if she can manage with her mother at home in pain.  ?
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
## 2021-07-30 NOTE — Discharge Instructions (Addendum)
[facility] will contact you within 24-48 hours of your discharge to set up home health services.

## 2021-07-31 ENCOUNTER — Inpatient Hospital Stay (HOSPITAL_COMMUNITY): Payer: Medicare Other

## 2021-07-31 ENCOUNTER — Encounter (HOSPITAL_COMMUNITY)
Admission: EM | Disposition: A | Discharge status: Home Health | Payer: Self-pay | Source: Home / Self Care | Attending: Internal Medicine

## 2021-07-31 ENCOUNTER — Encounter (HOSPITAL_COMMUNITY): Payer: Self-pay | Admitting: Radiology

## 2021-07-31 ENCOUNTER — Emergency Department (HOSPITAL_COMMUNITY): Payer: Medicare Other

## 2021-07-31 DIAGNOSIS — G309 Alzheimer's disease, unspecified: Secondary | ICD-10-CM | POA: Diagnosis present

## 2021-07-31 DIAGNOSIS — E785 Hyperlipidemia, unspecified: Secondary | ICD-10-CM | POA: Diagnosis present

## 2021-07-31 DIAGNOSIS — Z833 Family history of diabetes mellitus: Secondary | ICD-10-CM | POA: Diagnosis not present

## 2021-07-31 DIAGNOSIS — I509 Heart failure, unspecified: Secondary | ICD-10-CM | POA: Diagnosis not present

## 2021-07-31 DIAGNOSIS — R0789 Other chest pain: Secondary | ICD-10-CM | POA: Diagnosis not present

## 2021-07-31 DIAGNOSIS — F0283 Dementia in other diseases classified elsewhere, unspecified severity, with mood disturbance: Secondary | ICD-10-CM | POA: Diagnosis present

## 2021-07-31 DIAGNOSIS — I739 Peripheral vascular disease, unspecified: Secondary | ICD-10-CM | POA: Diagnosis present

## 2021-07-31 DIAGNOSIS — Z9889 Other specified postprocedural states: Secondary | ICD-10-CM | POA: Diagnosis not present

## 2021-07-31 DIAGNOSIS — S82454A Nondisplaced comminuted fracture of shaft of right fibula, initial encounter for closed fracture: Secondary | ICD-10-CM | POA: Diagnosis not present

## 2021-07-31 DIAGNOSIS — M25561 Pain in right knee: Secondary | ICD-10-CM | POA: Diagnosis not present

## 2021-07-31 DIAGNOSIS — R5381 Other malaise: Secondary | ICD-10-CM | POA: Diagnosis not present

## 2021-07-31 DIAGNOSIS — F32A Depression, unspecified: Secondary | ICD-10-CM | POA: Diagnosis present

## 2021-07-31 DIAGNOSIS — I5032 Chronic diastolic (congestive) heart failure: Secondary | ICD-10-CM | POA: Diagnosis present

## 2021-07-31 DIAGNOSIS — N390 Urinary tract infection, site not specified: Secondary | ICD-10-CM | POA: Diagnosis present

## 2021-07-31 DIAGNOSIS — Z96652 Presence of left artificial knee joint: Secondary | ICD-10-CM | POA: Diagnosis present

## 2021-07-31 DIAGNOSIS — S82201A Unspecified fracture of shaft of right tibia, initial encounter for closed fracture: Secondary | ICD-10-CM | POA: Diagnosis present

## 2021-07-31 DIAGNOSIS — Z8616 Personal history of COVID-19: Secondary | ICD-10-CM | POA: Diagnosis not present

## 2021-07-31 DIAGNOSIS — E875 Hyperkalemia: Secondary | ICD-10-CM | POA: Diagnosis present

## 2021-07-31 DIAGNOSIS — S82401A Unspecified fracture of shaft of right fibula, initial encounter for closed fracture: Secondary | ICD-10-CM | POA: Diagnosis present

## 2021-07-31 DIAGNOSIS — W1830XA Fall on same level, unspecified, initial encounter: Secondary | ICD-10-CM | POA: Diagnosis present

## 2021-07-31 DIAGNOSIS — S82141A Displaced bicondylar fracture of right tibia, initial encounter for closed fracture: Secondary | ICD-10-CM | POA: Diagnosis not present

## 2021-07-31 DIAGNOSIS — I951 Orthostatic hypotension: Secondary | ICD-10-CM | POA: Diagnosis present

## 2021-07-31 DIAGNOSIS — J449 Chronic obstructive pulmonary disease, unspecified: Secondary | ICD-10-CM

## 2021-07-31 DIAGNOSIS — R457 State of emotional shock and stress, unspecified: Secondary | ICD-10-CM | POA: Diagnosis not present

## 2021-07-31 DIAGNOSIS — I110 Hypertensive heart disease with heart failure: Secondary | ICD-10-CM | POA: Diagnosis present

## 2021-07-31 DIAGNOSIS — D696 Thrombocytopenia, unspecified: Secondary | ICD-10-CM | POA: Diagnosis present

## 2021-07-31 DIAGNOSIS — Z96641 Presence of right artificial hip joint: Secondary | ICD-10-CM | POA: Diagnosis present

## 2021-07-31 DIAGNOSIS — S83241A Other tear of medial meniscus, current injury, right knee, initial encounter: Secondary | ICD-10-CM | POA: Diagnosis not present

## 2021-07-31 DIAGNOSIS — Z823 Family history of stroke: Secondary | ICD-10-CM | POA: Diagnosis not present

## 2021-07-31 DIAGNOSIS — Z7401 Bed confinement status: Secondary | ICD-10-CM | POA: Diagnosis not present

## 2021-07-31 DIAGNOSIS — S82101A Unspecified fracture of upper end of right tibia, initial encounter for closed fracture: Secondary | ICD-10-CM | POA: Diagnosis not present

## 2021-07-31 DIAGNOSIS — B9620 Unspecified Escherichia coli [E. coli] as the cause of diseases classified elsewhere: Secondary | ICD-10-CM | POA: Diagnosis present

## 2021-07-31 DIAGNOSIS — S82144A Nondisplaced bicondylar fracture of right tibia, initial encounter for closed fracture: Secondary | ICD-10-CM | POA: Diagnosis present

## 2021-07-31 DIAGNOSIS — Z20822 Contact with and (suspected) exposure to covid-19: Secondary | ICD-10-CM | POA: Diagnosis present

## 2021-07-31 DIAGNOSIS — F0284 Dementia in other diseases classified elsewhere, unspecified severity, with anxiety: Secondary | ICD-10-CM | POA: Diagnosis present

## 2021-07-31 DIAGNOSIS — S82831A Other fracture of upper and lower end of right fibula, initial encounter for closed fracture: Secondary | ICD-10-CM | POA: Diagnosis present

## 2021-07-31 DIAGNOSIS — Z8249 Family history of ischemic heart disease and other diseases of the circulatory system: Secondary | ICD-10-CM | POA: Diagnosis not present

## 2021-07-31 DIAGNOSIS — Z6836 Body mass index (BMI) 36.0-36.9, adult: Secondary | ICD-10-CM | POA: Diagnosis not present

## 2021-07-31 HISTORY — PX: ORIF TIBIA PLATEAU: SHX2132

## 2021-07-31 LAB — CBC WITH DIFFERENTIAL/PLATELET
Abs Immature Granulocytes: 0.09 10*3/uL — ABNORMAL HIGH (ref 0.00–0.07)
Basophils Absolute: 0.1 10*3/uL (ref 0.0–0.1)
Basophils Relative: 1 %
Eosinophils Absolute: 0.3 10*3/uL (ref 0.0–0.5)
Eosinophils Relative: 3 %
HCT: 40 % (ref 36.0–46.0)
Hemoglobin: 12.3 g/dL (ref 12.0–15.0)
Immature Granulocytes: 1 %
Lymphocytes Relative: 9 %
Lymphs Abs: 0.8 10*3/uL (ref 0.7–4.0)
MCH: 28.5 pg (ref 26.0–34.0)
MCHC: 30.8 g/dL (ref 30.0–36.0)
MCV: 92.8 fL (ref 80.0–100.0)
Monocytes Absolute: 0.4 10*3/uL (ref 0.1–1.0)
Monocytes Relative: 5 %
Neutro Abs: 7.1 10*3/uL (ref 1.7–7.7)
Neutrophils Relative %: 81 %
Platelets: 130 10*3/uL — ABNORMAL LOW (ref 150–400)
RBC: 4.31 MIL/uL (ref 3.87–5.11)
RDW: 13.5 % (ref 11.5–15.5)
WBC: 8.7 10*3/uL (ref 4.0–10.5)
nRBC: 0 % (ref 0.0–0.2)

## 2021-07-31 LAB — URINALYSIS, ROUTINE W REFLEX MICROSCOPIC
Bilirubin Urine: NEGATIVE
Glucose, UA: NEGATIVE mg/dL
Hgb urine dipstick: NEGATIVE
Ketones, ur: NEGATIVE mg/dL
Nitrite: POSITIVE — AB
Protein, ur: NEGATIVE mg/dL
Specific Gravity, Urine: 1.019 (ref 1.005–1.030)
pH: 5 (ref 5.0–8.0)

## 2021-07-31 LAB — COMPREHENSIVE METABOLIC PANEL
ALT: 11 U/L (ref 0–44)
AST: 15 U/L (ref 15–41)
Albumin: 3.3 g/dL — ABNORMAL LOW (ref 3.5–5.0)
Alkaline Phosphatase: 51 U/L (ref 38–126)
Anion gap: 9 (ref 5–15)
BUN: 12 mg/dL (ref 8–23)
CO2: 28 mmol/L (ref 22–32)
Calcium: 8.6 mg/dL — ABNORMAL LOW (ref 8.9–10.3)
Chloride: 98 mmol/L (ref 98–111)
Creatinine, Ser: 0.82 mg/dL (ref 0.44–1.00)
GFR, Estimated: >60 mL/min (ref >60)
Glucose, Bld: 109 mg/dL — ABNORMAL HIGH (ref 70–99)
Potassium: 3.8 mmol/L (ref 3.5–5.1)
Sodium: 135 mmol/L (ref 135–145)
Total Bilirubin: 0.9 mg/dL (ref 0.3–1.2)
Total Protein: 6.3 g/dL — ABNORMAL LOW (ref 6.5–8.1)

## 2021-07-31 SURGERY — OPEN REDUCTION INTERNAL FIXATION (ORIF) TIBIAL PLATEAU
Anesthesia: General | Site: Leg Lower | Laterality: Right

## 2021-07-31 MED ORDER — PROPOFOL 500 MG/50ML IV EMUL
INTRAVENOUS | Status: DC | PRN
  Administered 2021-07-31: 75 ug/kg/min via INTRAVENOUS

## 2021-07-31 MED ORDER — ACETAMINOPHEN 325 MG PO TABS: 650.0000 mg | ORAL_TABLET | Freq: Four times a day (QID) | ORAL | Status: DC | PRN

## 2021-07-31 MED ORDER — CEFAZOLIN IN SODIUM CHLORIDE 3-0.9 GM/100ML-% IV SOLN
3.0000 g | INTRAVENOUS | Status: AC
  Administered 2021-07-31: 2 g via INTRAVENOUS
  Filled 2021-07-31: qty 100

## 2021-07-31 MED ORDER — FENTANYL CITRATE (PF) 100 MCG/2ML IJ SOLN
INTRAMUSCULAR | Status: DC | PRN
  Administered 2021-07-31: 50 ug via INTRAVENOUS
  Administered 2021-07-31 (×2): 25 ug via INTRAVENOUS

## 2021-07-31 MED ORDER — METOCLOPRAMIDE HCL 5 MG/ML IJ SOLN: 5.0000 mg | Freq: Three times a day (TID) | INTRAMUSCULAR | Status: DC | PRN

## 2021-07-31 MED ORDER — ACETAMINOPHEN 500 MG PO TABS: 500.0000 mg | ORAL_TABLET | Freq: Four times a day (QID) | ORAL | Status: DC | PRN

## 2021-07-31 MED ORDER — PROPOFOL 1000 MG/100ML IV EMUL
INTRAVENOUS | Status: AC
  Filled 2021-07-31: qty 100

## 2021-07-31 MED ORDER — LACTATED RINGERS IV SOLN
INTRAVENOUS | Status: DC | PRN
Start: 2021-07-31 — End: 2021-07-31

## 2021-07-31 MED ORDER — MORPHINE SULFATE (PF) 2 MG/ML IV SOLN: 2.0000 mg | INTRAVENOUS | Status: DC | PRN

## 2021-07-31 MED ORDER — TRANEXAMIC ACID-NACL 1000-0.7 MG/100ML-% IV SOLN
INTRAVENOUS | Status: AC
  Filled 2021-07-31: qty 100

## 2021-07-31 MED ORDER — DIPHENHYDRAMINE HCL 12.5 MG/5ML PO ELIX: 12.5000 mg | ORAL_SOLUTION | ORAL | Status: DC | PRN

## 2021-07-31 MED ORDER — VITAMIN D3 25 MCG (1000 UNIT) PO TABS
5000.0000 [IU] | ORAL_TABLET | Freq: Every day | ORAL | Status: DC
  Administered 2021-08-01 – 2021-08-04 (×4): 5000 [IU] via ORAL
  Filled 2021-07-31 (×4): qty 5

## 2021-07-31 MED ORDER — TRAMADOL HCL 50 MG PO TABS
50.0000 mg | ORAL_TABLET | Freq: Four times a day (QID) | ORAL | Status: DC
  Administered 2021-07-31: 50 mg via ORAL
  Filled 2021-07-31: qty 2

## 2021-07-31 MED ORDER — ACETAMINOPHEN 500 MG PO TABS
1000.0000 mg | ORAL_TABLET | Freq: Once | ORAL | Status: AC
  Administered 2021-07-31: 1000 mg via ORAL
  Filled 2021-07-31: qty 2

## 2021-07-31 MED ORDER — BISACODYL 10 MG RE SUPP: 10.0000 mg | Freq: Every day | RECTAL | Status: DC | PRN

## 2021-07-31 MED ORDER — ACETAMINOPHEN 325 MG PO TABS
325.0000 mg | ORAL_TABLET | Freq: Four times a day (QID) | ORAL | Status: DC | PRN
  Administered 2021-08-01 – 2021-08-02 (×2): 650 mg via ORAL
  Filled 2021-07-31 (×2): qty 2

## 2021-07-31 MED ORDER — MORPHINE SULFATE (PF) 2 MG/ML IV SOLN: 0.5000 mg | INTRAVENOUS | Status: DC | PRN

## 2021-07-31 MED ORDER — CEFAZOLIN SODIUM-DEXTROSE 2-4 GM/100ML-% IV SOLN
2.0000 g | Freq: Four times a day (QID) | INTRAVENOUS | Status: AC
  Administered 2021-07-31 – 2021-08-01 (×3): 2 g via INTRAVENOUS
  Filled 2021-07-31 (×3): qty 100

## 2021-07-31 MED ORDER — PROPOFOL 10 MG/ML IV BOLUS
INTRAVENOUS | Status: DC | PRN
Start: 2021-07-31 — End: 2021-07-31
  Administered 2021-07-31: 120 mg via INTRAVENOUS

## 2021-07-31 MED ORDER — SODIUM CHLORIDE 0.9 % IV SOLN
2.0000 g | INTRAVENOUS | Status: DC
  Administered 2021-08-01 – 2021-08-02 (×2): 2 g via INTRAVENOUS
  Filled 2021-07-31 (×3): qty 20

## 2021-07-31 MED ORDER — SODIUM CHLORIDE 0.9 % IV SOLN
2.0000 g | Freq: Once | INTRAVENOUS | Status: AC
  Administered 2021-07-31: 2 g via INTRAVENOUS
  Filled 2021-07-31: qty 20

## 2021-07-31 MED ORDER — FENTANYL CITRATE PF 50 MCG/ML IJ SOSY: 25.0000 ug | PREFILLED_SYRINGE | INTRAMUSCULAR | Status: DC | PRN

## 2021-07-31 MED ORDER — DOCUSATE SODIUM 100 MG PO CAPS
100.0000 mg | ORAL_CAPSULE | Freq: Two times a day (BID) | ORAL | Status: DC
  Administered 2021-07-31 – 2021-08-04 (×9): 100 mg via ORAL
  Filled 2021-07-31 (×9): qty 1

## 2021-07-31 MED ORDER — BUPIVACAINE HCL (PF) 0.5 % IJ SOLN
INTRAMUSCULAR | Status: AC
  Filled 2021-07-31: qty 30

## 2021-07-31 MED ORDER — ONDANSETRON HCL 4 MG/2ML IJ SOLN: 4.0000 mg | Freq: Once | INTRAMUSCULAR | Status: DC | PRN

## 2021-07-31 MED ORDER — TRANEXAMIC ACID-NACL 1000-0.7 MG/100ML-% IV SOLN
1000.0000 mg | Freq: Once | INTRAVENOUS | Status: AC
  Administered 2021-07-31: 1000 mg via INTRAVENOUS
  Filled 2021-07-31: qty 100

## 2021-07-31 MED ORDER — 0.9 % SODIUM CHLORIDE (POUR BTL) OPTIME
TOPICAL | Status: DC | PRN
Start: 2021-07-31 — End: 2021-07-31
  Administered 2021-07-31: 1000 mL

## 2021-07-31 MED ORDER — DEXAMETHASONE SODIUM PHOSPHATE 10 MG/ML IJ SOLN: 8.0000 mg | Freq: Once | INTRAMUSCULAR | Status: DC

## 2021-07-31 MED ORDER — METHOCARBAMOL 500 MG IVPB - SIMPLE MED
500.0000 mg | Freq: Four times a day (QID) | INTRAVENOUS | Status: DC | PRN
  Filled 2021-07-31: qty 50

## 2021-07-31 MED ORDER — PROCHLORPERAZINE EDISYLATE 10 MG/2ML IJ SOLN: 5.0000 mg | INTRAMUSCULAR | Status: DC | PRN

## 2021-07-31 MED ORDER — METHOCARBAMOL 500 MG PO TABS
500.0000 mg | ORAL_TABLET | Freq: Four times a day (QID) | ORAL | Status: DC | PRN
  Administered 2021-08-01 – 2021-08-02 (×3): 500 mg via ORAL
  Filled 2021-07-31 (×4): qty 1

## 2021-07-31 MED ORDER — ONDANSETRON HCL 4 MG/2ML IJ SOLN
INTRAMUSCULAR | Status: DC | PRN
Start: 2021-07-31 — End: 2021-07-31
  Administered 2021-07-31: 4 mg via INTRAVENOUS

## 2021-07-31 MED ORDER — DEXAMETHASONE SODIUM PHOSPHATE 4 MG/ML IJ SOLN
INTRAMUSCULAR | Status: DC | PRN
  Administered 2021-07-31: 8 mg via INTRAVENOUS

## 2021-07-31 MED ORDER — CELECOXIB 200 MG PO CAPS
200.0000 mg | ORAL_CAPSULE | Freq: Two times a day (BID) | ORAL | Status: DC
  Administered 2021-07-31 – 2021-08-04 (×7): 200 mg via ORAL
  Filled 2021-07-31 (×9): qty 1

## 2021-07-31 MED ORDER — ENOXAPARIN SODIUM 40 MG/0.4ML IJ SOSY: 40.0000 mg | PREFILLED_SYRINGE | INTRAMUSCULAR | Status: DC

## 2021-07-31 MED ORDER — NITROFURANTOIN MONOHYD MACRO 100 MG PO CAPS
100.0000 mg | ORAL_CAPSULE | Freq: Two times a day (BID) | ORAL | Status: DC
  Administered 2021-07-31: 100 mg via ORAL
  Filled 2021-07-31: qty 1

## 2021-07-31 MED ORDER — METOCLOPRAMIDE HCL 5 MG PO TABS: 5.0000 mg | ORAL_TABLET | Freq: Three times a day (TID) | ORAL | Status: DC | PRN

## 2021-07-31 MED ORDER — ONDANSETRON HCL 4 MG/2ML IJ SOLN: 4.0000 mg | Freq: Four times a day (QID) | INTRAMUSCULAR | Status: DC | PRN

## 2021-07-31 MED ORDER — AMISULPRIDE (ANTIEMETIC) 5 MG/2ML IV SOLN: 10.0000 mg | Freq: Once | INTRAVENOUS | Status: DC | PRN

## 2021-07-31 MED ORDER — TRAMADOL HCL 50 MG PO TABS: 50.0000 mg | ORAL_TABLET | Freq: Four times a day (QID) | ORAL | Status: DC

## 2021-07-31 MED ORDER — ACETAMINOPHEN 650 MG RE SUPP
650.0000 mg | Freq: Four times a day (QID) | RECTAL | Status: DC | PRN
  Filled 2021-07-31: qty 1

## 2021-07-31 MED ORDER — POVIDONE-IODINE 10 % EX SWAB
2.0000 "application " | Freq: Once | CUTANEOUS | Status: AC
  Administered 2021-07-31: 2 via TOPICAL

## 2021-07-31 MED ORDER — POLYETHYLENE GLYCOL 3350 17 G PO PACK
17.0000 g | PACK | Freq: Every day | ORAL | Status: DC | PRN
  Administered 2021-08-04: 17 g via ORAL
  Filled 2021-07-31 (×2): qty 1

## 2021-07-31 MED ORDER — ENOXAPARIN SODIUM 40 MG/0.4ML IJ SOSY
40.0000 mg | PREFILLED_SYRINGE | INTRAMUSCULAR | Status: DC
  Administered 2021-08-01 – 2021-08-04 (×4): 40 mg via SUBCUTANEOUS
  Filled 2021-07-31 (×4): qty 0.4

## 2021-07-31 MED ORDER — LIDOCAINE HCL (PF) 2 % IJ SOLN
INTRAMUSCULAR | Status: AC
  Filled 2021-07-31: qty 5

## 2021-07-31 MED ORDER — BUPIVACAINE HCL 0.25 % IJ SOLN
INTRAMUSCULAR | Status: AC
Start: 2021-07-31 — End: ?
  Filled 2021-07-31: qty 1

## 2021-07-31 MED ORDER — LACTATED RINGERS IV SOLN
INTRAVENOUS | Status: DC
Start: 2021-07-31 — End: 2021-07-31

## 2021-07-31 MED ORDER — PROPOFOL 500 MG/50ML IV EMUL
INTRAVENOUS | Status: AC
  Filled 2021-07-31: qty 50

## 2021-07-31 MED ORDER — TRANEXAMIC ACID-NACL 1000-0.7 MG/100ML-% IV SOLN
INTRAVENOUS | Status: DC | PRN
  Administered 2021-07-31: 1000 mg via INTRAVENOUS

## 2021-07-31 MED ORDER — PHENYLEPHRINE 40 MCG/ML (10ML) SYRINGE FOR IV PUSH (FOR BLOOD PRESSURE SUPPORT)
PREFILLED_SYRINGE | INTRAVENOUS | Status: DC | PRN
  Administered 2021-07-31: 40 ug via INTRAVENOUS

## 2021-07-31 MED ORDER — PHENYLEPHRINE 40 MCG/ML (10ML) SYRINGE FOR IV PUSH (FOR BLOOD PRESSURE SUPPORT)
PREFILLED_SYRINGE | INTRAVENOUS | Status: AC
  Filled 2021-07-31: qty 10

## 2021-07-31 MED ORDER — LIDOCAINE 2% (20 MG/ML) 5 ML SYRINGE
INTRAMUSCULAR | Status: DC | PRN
  Administered 2021-07-31: 50 mg via INTRAVENOUS

## 2021-07-31 MED ORDER — FENTANYL CITRATE (PF) 100 MCG/2ML IJ SOLN
INTRAMUSCULAR | Status: AC
  Filled 2021-07-31: qty 2

## 2021-07-31 MED ORDER — LORATADINE 10 MG PO TABS
10.0000 mg | ORAL_TABLET | Freq: Every day | ORAL | Status: DC
  Administered 2021-08-01 – 2021-08-04 (×4): 10 mg via ORAL
  Filled 2021-07-31 (×4): qty 1

## 2021-07-31 MED ORDER — ACETAMINOPHEN 500 MG PO TABS
500.0000 mg | ORAL_TABLET | Freq: Four times a day (QID) | ORAL | Status: AC
  Administered 2021-07-31 – 2021-08-01 (×3): 500 mg via ORAL
  Filled 2021-07-31 (×4): qty 1

## 2021-07-31 MED ORDER — ONDANSETRON HCL 4 MG PO TABS: 4.0000 mg | ORAL_TABLET | Freq: Four times a day (QID) | ORAL | Status: DC | PRN

## 2021-07-31 SURGICAL SUPPLY — 86 items
APL PRP STRL LF DISP 70% ISPRP (MISCELLANEOUS) ×1
BAG COUNTER SPONGE SURGICOUNT (BAG) IMPLANT
BAG DECANTER FOR FLEXI CONT (MISCELLANEOUS) IMPLANT
BAG SPNG CNTER NS LX DISP (BAG)
BAG SURGICOUNT SPONGE COUNTING (BAG)
BANDAGE ESMARK 6X9 LF (GAUZE/BANDAGES/DRESSINGS) ×2 IMPLANT
BIT DRILL 2.5 (BIT) ×2
BIT DRILL 2.5MM (BIT) ×1
BIT DRILL 3.1 (BIT) ×1 IMPLANT
BIT DRILL 3.1MM (BIT) ×1
BIT DRILL SHRT 216X2.5XNS LF (BIT) IMPLANT
BIT DRILL TWST MATTA 3.5MX195M (BIT) IMPLANT
BIT DRL SHRT 216X2.5XNS LF (BIT) ×1
BLADE SURG 15 STRL LF DISP TIS (BLADE) ×2 IMPLANT
BLADE SURG 15 STRL SS (BLADE) ×3
BNDG CMPR 9X6 STRL LF SNTH (GAUZE/BANDAGES/DRESSINGS) ×1
BNDG ELASTIC 4X5.8 VLCR STR LF (GAUZE/BANDAGES/DRESSINGS) ×4 IMPLANT
BNDG ELASTIC 6X5.8 VLCR STR LF (GAUZE/BANDAGES/DRESSINGS) ×4 IMPLANT
BNDG ESMARK 6X9 LF (GAUZE/BANDAGES/DRESSINGS) ×3
CHLORAPREP W/TINT 26 (MISCELLANEOUS) ×4 IMPLANT
CLOSURE STERI-STRIP 1/2X4 (GAUZE/BANDAGES/DRESSINGS)
CLSR STERI-STRIP ANTIMIC 1/2X4 (GAUZE/BANDAGES/DRESSINGS) IMPLANT
CUFF TOURN SGL QUICK 34 (TOURNIQUET CUFF)
CUFF TRNQT CYL 34X4.125X (TOURNIQUET CUFF) IMPLANT
DRAPE C-ARM 42X120 X-RAY (DRAPES) ×4 IMPLANT
DRAPE C-ARMOR (DRAPES) ×4 IMPLANT
DRAPE EXTREMITY T 121X128X90 (DISPOSABLE) ×4 IMPLANT
DRAPE U-SHAPE 47X51 STRL (DRAPES) ×4 IMPLANT
DRILL TWIST AO MATTA 3.5MX195M (BIT) ×3
DRSG ADAPTIC 3X8 NADH LF (GAUZE/BANDAGES/DRESSINGS) ×4 IMPLANT
DRSG EMULSION OIL 3X3 NADH (GAUZE/BANDAGES/DRESSINGS) ×2 IMPLANT
DRSG PAD ABDOMINAL 8X10 ST (GAUZE/BANDAGES/DRESSINGS) ×4 IMPLANT
ELECT PENCIL ROCKER SW 15FT (MISCELLANEOUS) ×4 IMPLANT
GAUZE SPONGE 4X4 12PLY STRL (GAUZE/BANDAGES/DRESSINGS) ×4 IMPLANT
GAUZE XEROFORM 1X8 LF (GAUZE/BANDAGES/DRESSINGS) ×4 IMPLANT
GLOVE SRG 8 PF TXTR STRL LF DI (GLOVE) ×2 IMPLANT
GLOVE SURG ENC MOIS LTX SZ7.5 (GLOVE) ×4 IMPLANT
GLOVE SURG POLYISO LF SZ7.5 (GLOVE) ×4 IMPLANT
GLOVE SURG UNDER POLY LF SZ7.5 (GLOVE) ×4 IMPLANT
GLOVE SURG UNDER POLY LF SZ8 (GLOVE) ×3
GOWN STRL REUS W/ TWL LRG LVL3 (GOWN DISPOSABLE) ×2 IMPLANT
GOWN STRL REUS W/TWL LRG LVL3 (GOWN DISPOSABLE) ×3
IMMOBILIZER KNEE 20 (SOFTGOODS) IMPLANT
IMMOBILIZER KNEE 20 THIGH 36 (SOFTGOODS) IMPLANT
IMMOBILIZER KNEE 22 UNIV (SOFTGOODS) IMPLANT
K-WIRE 2.0 (WIRE) ×6
K-WIRE FX234X2XDRL TIP NS (WIRE) ×2
KIT BASIN OR (CUSTOM PROCEDURE TRAY) ×4 IMPLANT
KWIRE FX234X2XDRL TIP NS (WIRE) IMPLANT
MANIFOLD NEPTUNE II (INSTRUMENTS) ×4 IMPLANT
NS IRRIG 1000ML POUR BTL (IV SOLUTION) ×4 IMPLANT
PACK ARTHROSCOPY DSU (CUSTOM PROCEDURE TRAY) ×4 IMPLANT
PAD CAST 4YDX4 CTTN HI CHSV (CAST SUPPLIES) ×2 IMPLANT
PADDING CAST COTTON 4X4 STRL (CAST SUPPLIES) ×3
PADDING CAST COTTON 6X4 STRL (CAST SUPPLIES) ×6 IMPLANT
PENCIL SMOKE EVACUATOR (MISCELLANEOUS) ×4 IMPLANT
PLATE PROXIMAL TIB RT 2H (Plate) ×2 IMPLANT
SCREW BONE 38X3.5MM (Screw) ×2 IMPLANT
SCREW BONE 40X3.5MM (Screw) ×2 IMPLANT
SCREW CANC 4.0X70MM (Screw) ×2 IMPLANT
SCREW CORT 50X3.5XNS TI (Screw) IMPLANT
SCREW CORTICAL 3.5X50MM (Screw) ×3 IMPLANT
SCREW LOCKING 4.0X50MM (Screw) ×2 IMPLANT
SCREW LOCKING 75X4.0MM (Screw) ×4 IMPLANT
SCREW LOCKING 80X4.0MM (Screw) ×2 IMPLANT
SLEEVE SCD COMPRESS KNEE MED (STOCKING) IMPLANT
SPONGE T-LAP 18X18 ~~LOC~~+RFID (SPONGE) ×4 IMPLANT
SPONGE T-LAP 4X18 ~~LOC~~+RFID (SPONGE) ×4 IMPLANT
STAPLER VISISTAT 35W (STAPLE) IMPLANT
SUCTION FRAZIER HANDLE 10FR (MISCELLANEOUS)
SUCTION TUBE FRAZIER 10FR DISP (MISCELLANEOUS) IMPLANT
SUT ETHILON 3 0 PS 1 (SUTURE) IMPLANT
SUT FIBERWIRE #2 38 T-5 BLUE (SUTURE) ×6
SUT MNCRL AB 4-0 PS2 18 (SUTURE) ×4 IMPLANT
SUT MON AB 2-0 CT1 36 (SUTURE) ×4 IMPLANT
SUT STEEL 7 (SUTURE) IMPLANT
SUT VIC AB 0 CT1 27 (SUTURE) ×9
SUT VIC AB 0 CT1 27XBRD ANBCTR (SUTURE) ×6 IMPLANT
SUT VIC AB 1 CT1 27 (SUTURE) ×3
SUT VIC AB 1 CT1 27XBRD ANBCTR (SUTURE) ×2 IMPLANT
SUT VIC AB 2-0 SH 27 (SUTURE)
SUT VIC AB 2-0 SH 27XBRD (SUTURE) IMPLANT
SUTURE FIBERWR #2 38 T-5 BLUE (SUTURE) IMPLANT
SYR BULB EAR ULCER 3OZ GRN STR (SYRINGE) ×4 IMPLANT
UNDERPAD 30X36 HEAVY ABSORB (UNDERPADS AND DIAPERS) ×4 IMPLANT
YANKAUER SUCT BULB TIP NO VENT (SUCTIONS) ×4 IMPLANT

## 2021-07-31 NOTE — Transfer of Care (Signed)
Immediate Anesthesia Transfer of Care Note ? ?Patient: Sabrina Page ? ?Procedure(s) Performed: OPEN REDUCTION INTERNAL FIXATION (ORIF) TIBIAL PLATEAU (Right: Leg Lower) ? ?Patient Location: PACU ? ?Anesthesia Type:General ? ?Level of Consciousness: drowsy and patient cooperative ? ?Airway & Oxygen Therapy: Patient Spontanous Breathing and Patient connected to face mask oxygen ? ?Post-op Assessment: Report given to RN and Post -op Vital signs reviewed and stable ? ?Post vital signs: Reviewed and stable ? ?Last Vitals:  ?Vitals Value Taken Time  ?BP 144/64 07/31/21 1619  ?Temp    ?Pulse 70 07/31/21 1622  ?Resp 18 07/31/21 1622  ?SpO2 100 % 07/31/21 1622  ?Vitals shown include unvalidated device data. ? ?Last Pain:  ?Vitals:  ? 07/31/21 1037  ?TempSrc:   ?PainSc: 0-No pain  ?   ? ?  ? ?Complications: No notable events documented. ?

## 2021-07-31 NOTE — Progress Notes (Signed)
PT Cancellation Note ? ?Patient Details ?Name: Sabrina Page ?MRN: [id number] ?DOB: 1934/01/05 ? ? ?Cancelled Treatment:    Reason Eval/Treat Not Completed: Other (comment) ?Per ortho pt now to have ORIF.  Will hold PT today and await further instruction from ortho. ? ?Abran Richard, PT ?Acute Rehab Services ?Pager [phone] ?[facility] [phone] ? ? ?Sabrina Page ?07/31/2021, 2:04 PM ?

## 2021-07-31 NOTE — ED Notes (Signed)
When asked how her pain is pt states that it is "medium".  I asked pt if she would like her pain medicine and she said she did.   ?

## 2021-07-31 NOTE — ED Provider Notes (Signed)
Nursing staff reports in and out cath sent.  UA is concerning for UTI.  Will send culture and start po abx.  ?  ?Quintella Reichert, MD ?07/31/21 [phone] ? ?
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
## 2021-07-31 NOTE — Anesthesia Preprocedure Evaluation (Addendum)
Anesthesia Evaluation  ?Patient identified by MRN, date of birth, ID band ?Patient confused ? ? ? ?Reviewed: ?Allergy & Precautions, NPO status , Patient's Chart, lab work & pertinent test results ? ?Airway ?Mallampati: II ? ?TM Distance: >3 FB ?Neck ROM: Full ? ? ? Dental ?no notable dental hx. ?(+) Upper Dentures, Lower Dentures ?  ?Pulmonary ?asthma , sleep apnea , COPD, former smoker,  ?  ?Pulmonary exam normal ?breath sounds clear to auscultation ? ? ? ? ? ? Cardiovascular ?hypertension, Pt. on medications ?+ Peripheral Vascular Disease and +CHF  ?Normal cardiovascular exam ?Rhythm:Regular Rate:Normal ? ?EKG 07/31/21: NSR ? ? ?ECHO 2017: ?- Left ventricle: The cavity size was normal. Wall thickness was  ???increased in a pattern of moderate LVH. Systolic function was  ???normal. The estimated ejection fraction was in the range of 55%  ???to 60%. Wall motion was normal; there were no regional wall  ???motion abnormalities. Doppler parameters are consistent with  ???pseudormal left ventricular relaxation (grade 2 diastolic  ???dysfunction). The E/e&' ratio is between 8-15, suggesting  ???indeterminate LV fillig pressure.  ?- Aortic valve: Trileaflet. Sclerosis without stenosis.  ?- Mitral valve: Mildly thickened leaflets . There was trivial  ???regurgitation.  ?- Left atrium: The atrium was mildly dilated.  ?- Atrial septum: A patent foramen ovale cannot be excluded.  ?- Tricuspid valve: There was trivial regurgitation.  ?- Pulmonary arteries: PA peak pressure: 28 mm Hg (S).  ?- Inferior vena cava: The vessel was normal in size. The  ???respirophasic diameter changes were in the normal range (>= 50%),  ???consistent with normal central venous pressure.  ?  ?Neuro/Psych ?PSYCHIATRIC DISORDERS Anxiety Depression Dementia negative neurological ROS ?   ? GI/Hepatic ?Neg liver ROS, GERD  ,  ?Endo/Other  ?negative endocrine ROS ? Renal/GU ?negative Renal ROS  ?negative genitourinary ?   ?Musculoskeletal ? ?(+) Arthritis , Osteoarthritis,   ? Abdominal ?  ?Peds ?negative pediatric ROS ?(+)  Hematology ?negative hematology ROS ?(+)   ?Anesthesia Other Findings ? ? Reproductive/Obstetrics ?negative OB ROS ? ?  ? ? ? ? ? ? ? ? ? ? ? ? ? ?  ?  ? ? ? ? ? ? ?Anesthesia Physical ?Anesthesia Plan ? ?ASA: 3 ? ?Anesthesia Plan: General  ? ?Post-op Pain Management:   ? ?Induction: Intravenous ? ?PONV Risk Score and Plan: 3 and Treatment may vary due to age or medical condition, Ondansetron, Dexamethasone, Propofol infusion and TIVA ? ?Airway Management Planned: LMA ? ?Additional Equipment: None ? ?Intra-op Plan:  ? ?Post-operative Plan: Extubation in OR ? ?Informed Consent: I have reviewed the patients History and Physical, chart, labs and discussed the procedure including the risks, benefits and alternatives for the proposed anesthesia with the patient or authorized representative who has indicated his/her understanding and acceptance.  ? ? ? ?Consent reviewed with POA ? ?Plan Discussed with: Anesthesiologist and CRNA ? ?Anesthesia Plan Comments: (Had a thorough discussion with patient's daughter regarding the anesthesia plan.The patient is non-ambulatory at baseline and is much more deconditioned since her last surgery.  Advised that it would take a significant amount of IV medication to position patient properly for a spinal. The plan will be to do an LMA with TIVA (Propofol gtt). I advised that the patient also received a propofol gtt for her last surgery several years ago. Per the daughter the patient is full code for this procedure including defibrillation and chest compressions. Norton Blizzard, MD  ?)  ? ? ? ? ? ?Anesthesia Quick  Evaluation ? ?

## 2021-07-31 NOTE — Plan of Care (Signed)
  Problem: Pain Managment: Goal: General experience of comfort will improve Outcome: Progressing   

## 2021-07-31 NOTE — ED Provider Notes (Signed)
I assumed care of the patient at 0 700, briefly the patient is a 88 year old female who is bedbound at baseline with knee pain.  Found to have a proximal tibial fracture that likely will require repair.  Her urine has come back positive for urinary tract infection.  I discussed the case with orthopedics who would do plan to try and take her to the OR.  I will discuss the case with medicine for possible admission. ? ? ?  ?Deno Etienne, DO ?07/31/21 1318 ? ?

## 2021-07-31 NOTE — Interval H&P Note (Signed)
History and Physical Interval Note: ? ?07/31/2021 ?2:02 PM ? ?Sabrina Page  has presented today for surgery, with the diagnosis of [place].  The various methods of treatment have been discussed with the patient and family. After consideration of risks, benefits and other options for treatment, the patient has consented to  Procedure(s): ?OPEN REDUCTION INTERNAL FIXATION (ORIF) TIBIAL PLATEAU (Right) as a surgical intervention.  The patient's history has been reviewed, patient examined, no change in status, stable for surgery.  I have reviewed the patient's chart and labs.  Questions were answered to the patient's satisfaction.   ? ? ?Renette Butters ? ? ?

## 2021-07-31 NOTE — ED Notes (Signed)
Pt repositioned, I&O cath for urine sample (pt has a small abrasion (from depend?) next to left labia, loosened depend after cleaning with NS.  Pt given some more water to drink as she has voided very little. Asked pt if she has pain and if she wants a pain pill, she said yes to both.  ?
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
## 2021-07-31 NOTE — ED Notes (Signed)
EDP notified of resulted UA, new order received for culture.  Lab called to add culture ?

## 2021-07-31 NOTE — Anesthesia Procedure Notes (Signed)
Procedure Name: LMA Insertion ?Date/Time: 07/31/2021 2:28 PM ?Performed by: Montel Clock, CRNA ?Pre-anesthesia Checklist: Patient identified, Emergency Drugs available, Suction available, Patient being monitored and Timeout performed ?Patient Re-evaluated:Patient Re-evaluated prior to induction ?Oxygen Delivery Method: Circle system utilized ?Preoxygenation: Pre-oxygenation with 100% oxygen ?Induction Type: IV induction ?Ventilation: Mask ventilation without difficulty ?LMA: LMA inserted ?LMA Size: 4.0 ?Number of attempts: 1 ?Dental Injury: Teeth and Oropharynx as per pre-operative assessment  ? ? ? ? ?

## 2021-07-31 NOTE — Progress Notes (Signed)
PT Cancellation Note ? ?Patient Details ?Name: Sabrina Page ?MRN: [id number] ?DOB: 1933/11/26 ? ? ?Cancelled Treatment:    Reason Eval/Treat Not Completed: Other (comment) ?Pt now with CT ordered and possible surgery pending CT results.  Will await results and further ortho plan prior to PT eval. ?Sabrina Page, PT ?Acute Rehab Services ?Pager [phone] ?[facility] [phone] ? ? ?Sabrina Page ?07/31/2021, 11:24 AM ?
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
## 2021-07-31 NOTE — ED Notes (Signed)
Pt had been sleeping whenever I looked in on her but now she appeared awake.  I went in and offered to give her some water, pt drank about half a cup of water at this time.   ?

## 2021-08-01 ENCOUNTER — Inpatient Hospital Stay (HOSPITAL_COMMUNITY): Payer: Medicare Other

## 2021-08-01 ENCOUNTER — Other Ambulatory Visit: Payer: Self-pay

## 2021-08-01 DIAGNOSIS — S82401A Unspecified fracture of shaft of right fibula, initial encounter for closed fracture: Secondary | ICD-10-CM | POA: Diagnosis not present

## 2021-08-01 DIAGNOSIS — S82201A Unspecified fracture of shaft of right tibia, initial encounter for closed fracture: Secondary | ICD-10-CM | POA: Diagnosis not present

## 2021-08-01 DIAGNOSIS — D696 Thrombocytopenia, unspecified: Secondary | ICD-10-CM | POA: Diagnosis present

## 2021-08-01 LAB — COMPREHENSIVE METABOLIC PANEL
ALT: 9 U/L (ref 0–44)
AST: 21 U/L (ref 15–41)
Albumin: 2.8 g/dL — ABNORMAL LOW (ref 3.5–5.0)
Alkaline Phosphatase: 42 U/L (ref 38–126)
Anion gap: 5 (ref 5–15)
BUN: 16 mg/dL (ref 8–23)
CO2: 28 mmol/L (ref 22–32)
Calcium: 8.2 mg/dL — ABNORMAL LOW (ref 8.9–10.3)
Chloride: 102 mmol/L (ref 98–111)
Creatinine, Ser: 0.68 mg/dL (ref 0.44–1.00)
GFR, Estimated: >60 mL/min (ref >60)
Glucose, Bld: 132 mg/dL — ABNORMAL HIGH (ref 70–99)
Potassium: 3.9 mmol/L (ref 3.5–5.1)
Sodium: 135 mmol/L (ref 135–145)
Total Bilirubin: 1.5 mg/dL — ABNORMAL HIGH (ref 0.3–1.2)
Total Protein: 5.7 g/dL — ABNORMAL LOW (ref 6.5–8.1)

## 2021-08-01 LAB — CBC WITH DIFFERENTIAL/PLATELET
Abs Immature Granulocytes: 0.12 10*3/uL — ABNORMAL HIGH (ref 0.00–0.07)
Basophils Absolute: 0 10*3/uL (ref 0.0–0.1)
Basophils Relative: 0 %
Eosinophils Absolute: 0.2 10*3/uL (ref 0.0–0.5)
Eosinophils Relative: 3 %
HCT: 35.3 % — ABNORMAL LOW (ref 36.0–46.0)
Hemoglobin: 11.5 g/dL — ABNORMAL LOW (ref 12.0–15.0)
Immature Granulocytes: 1 %
Lymphocytes Relative: 10 %
Lymphs Abs: 0.9 10*3/uL (ref 0.7–4.0)
MCH: 29.5 pg (ref 26.0–34.0)
MCHC: 32.6 g/dL (ref 30.0–36.0)
MCV: 90.5 fL (ref 80.0–100.0)
Monocytes Absolute: 0.7 10*3/uL (ref 0.1–1.0)
Monocytes Relative: 8 %
Neutro Abs: 6.5 10*3/uL (ref 1.7–7.7)
Neutrophils Relative %: 78 %
Platelets: 138 10*3/uL — ABNORMAL LOW (ref 150–400)
RBC: 3.9 MIL/uL (ref 3.87–5.11)
RDW: 13.4 % (ref 11.5–15.5)
WBC: 8.4 10*3/uL (ref 4.0–10.5)
nRBC: 0 % (ref 0.0–0.2)

## 2021-08-01 MED ORDER — MIDODRINE HCL 5 MG PO TABS
5.0000 mg | ORAL_TABLET | Freq: Three times a day (TID) | ORAL | Status: DC | PRN
  Administered 2021-08-01: 5 mg via ORAL
  Filled 2021-08-01: qty 1

## 2021-08-01 MED ORDER — ORAL CARE MOUTH RINSE
15.0000 mL | Freq: Two times a day (BID) | OROMUCOSAL | Status: DC
  Administered 2021-08-01 – 2021-08-04 (×7): 15 mL via OROMUCOSAL

## 2021-08-01 MED ORDER — PROCHLORPERAZINE EDISYLATE 10 MG/2ML IJ SOLN
5.0000 mg | INTRAMUSCULAR | Status: DC | PRN
  Administered 2021-08-01: 5 mg via INTRAVENOUS
  Filled 2021-08-01: qty 2

## 2021-08-01 MED ORDER — HYDRALAZINE HCL 25 MG PO TABS: 25.0000 mg | ORAL_TABLET | Freq: Three times a day (TID) | ORAL | Status: DC

## 2021-08-01 MED ORDER — HYDROCODONE-ACETAMINOPHEN 5-325 MG PO TABS: 1.0000 | ORAL_TABLET | Freq: Four times a day (QID) | ORAL | Status: DC | PRN

## 2021-08-01 MED ORDER — ADULT MULTIVITAMIN W/MINERALS CH
1.0000 | ORAL_TABLET | Freq: Every day | ORAL | Status: DC
  Administered 2021-08-02 – 2021-08-04 (×3): 1 via ORAL
  Filled 2021-08-01 (×3): qty 1

## 2021-08-01 MED ORDER — HYDRALAZINE HCL 25 MG PO TABS: 25.0000 mg | ORAL_TABLET | Freq: Four times a day (QID) | ORAL | Status: DC | PRN

## 2021-08-01 MED ORDER — DIPHENHYDRAMINE HCL 50 MG/ML IJ SOLN: 25.0000 mg | Freq: Four times a day (QID) | INTRAMUSCULAR | Status: DC | PRN

## 2021-08-01 MED ORDER — ONDANSETRON HCL 4 MG/2ML IJ SOLN: 4.0000 mg | Freq: Four times a day (QID) | INTRAMUSCULAR | Status: DC | PRN

## 2021-08-01 MED ORDER — ENSURE ENLIVE PO LIQD
237.0000 mL | Freq: Three times a day (TID) | ORAL | Status: DC
  Administered 2021-08-01 – 2021-08-04 (×4): 237 mL via ORAL

## 2021-08-01 MED ORDER — FLUDROCORTISONE ACETATE 0.1 MG PO TABS
0.1000 mg | ORAL_TABLET | Freq: Every day | ORAL | Status: DC
  Filled 2021-08-01: qty 1

## 2021-08-01 MED ORDER — SODIUM CHLORIDE 0.9 % IV BOLUS
250.0000 mL | Freq: Once | INTRAVENOUS | Status: AC
  Administered 2021-08-01: 250 mL via INTRAVENOUS

## 2021-08-01 MED ORDER — FLUDROCORTISONE ACETATE 0.1 MG PO TABS
0.1000 mg | ORAL_TABLET | Freq: Every day | ORAL | Status: DC
  Administered 2021-08-02 – 2021-08-04 (×3): 0.1 mg via ORAL
  Filled 2021-08-01 (×3): qty 1

## 2021-08-01 NOTE — Progress Notes (Signed)
Initial Nutrition Assessment ? ?DOCUMENTATION CODES:  ? ?Obesity unspecified ? ?INTERVENTION:  ? ?Ensure Enlive po TID, each supplement provides 350 kcal and 20 grams of protein. ? ?MVI po daily  ? ?NUTRITION DIAGNOSIS:  ? ?Increased nutrient needs related to hip fracture as evidenced by estimated needs. ? ?GOAL:  ? ?Patient will meet greater than or equal to 90% of their needs ? ?MONITOR:  ? ?PO intake, Supplement acceptance, Labs, Weight trends, Skin, I & O's ? ?REASON FOR ASSESSMENT:  ? ?Consult ?Hip fracture protocol ? ?ASSESSMENT:  ? ?88 y.o. female with medical history significant of anxiety, depression, asthma, chronic diastolic heart failure, CKD, cystitis, COPD, COVID-19 infection (9/22), Alzheimer's dementia, vertigo, gallstones, GERD, hyperlipidemia, hypertension, orthostatic hypotension, osteoarthritis, osteoporosis, PVD and right hip fracture s/p ORIF 01/2021 who is admitted with right hip fracture after fall now s/ ORIF 3/10. ? ?RD working remotely. ? ?Unable to speak with pt by phone as pt speaks [place] and requires interpreter; pt is also noted to have dementia and can provide only limited history. Pt with increased estimated needs r/t hip fracture. There are no documented meal intakes from this admission. RD will add supplements and MVI to help pt meet her estimated needs. There is no documented weight in chart since September to determine if any recent significant weight changes. RD will obtain nutrition related history and exam at follow up.  ? ?Medications reviewed and include: vitamin D, colace, lovenox, protonix, ceftriaxone  ? ?Labs reviewed: K 3.8 wnl ? ?NUTRITION - FOCUSED PHYSICAL EXAM: ?Unable to perform at this time  ? ?Diet Order:   ?Diet Order   ? ?       ?  DIET DYS 3 Room service appropriate? Yes; Fluid consistency: Thin  Diet effective now       ?  ? ?  ?  ? ?  ? ?EDUCATION NEEDS:  ? ?Not appropriate for education at this time ? ?Skin:  Skin Assessment: Reviewed RN Assessment  (incision R leg, MASD) ? ?Last BM:  3/11- type 4 ? ?Height:  ? ?Ht Readings from Last 1 Encounters:  ?07/31/21 '5\' 5"'$  (1.651 m)  ? ? ?Weight:  ? ?Wt Readings from Last 1 Encounters:  ?01/17/21 106 kg  ? ? ?Ideal Body Weight:  56.8 kg ? ?BMI:  Body mass index is 38.89 kg/m?. ? ?Estimated Nutritional Needs:  ? ?Kcal:  1800-2100kcal/day ? ?Protein:  90-105g/day ? ?Fluid:  1.4-1.6L/day ? ?Koleen Distance MS, RD, LDN ?Please refer to [facility] for RD and/or RD on-call/weekend/after hours pager ? ?

## 2021-08-01 NOTE — Progress Notes (Signed)
?  Progress Note ? ? ?PatientRodney Page EQA:[id number] DOB: 02-27-1934 DOA: 07/30/2021     Hospitalization day: 1 ?DOS: the patient was seen and examined on 08/01/2021 ? ?Brief hospital course: ?Past medical history of anxiety depression, chronic diastolic CHF, COPD, Alzheimer's dementia, morbid obesity, OSA.  No evidence of CKD. ?Presents to the hospital with a mechanical fall found to have tibia-fibula fracture of the right leg. ?Underwent ORIF by Dr. Percell Miller on 3/10. ?Also found to have UTI. ? ?Assessment and Plan: ?* Tibia/fibula fracture, right, closed, initial encounter ?Presents with a mechanical fall. ?No head injury. ?Underwent open reduction internal fixation of tibial plateau to by Dr. Percell Miller on 3/10. ?Plan is nonweightbearing of right lower extremity. ?Knee immobilizer full-time. ?Pain medication and DVT prophylaxis per orthopedics. ?PT OT recommends home with home health.  At baseline patient has 24/7 caregiver and requires total assist with mechanical lift for transfers. ? ?Acute UTI (urinary tract infection) ?E. coli and Klebsiella growing in urine. ?Currently on IV ceftriaxone.  Monitor for sensitivity. ? ?Orthostatic hypotension ?Patient has chronic orthostatic hypotension and is on Florinef.  We will continue. ?Also on midodrine as needed which I will continue for now. ? ?Thrombocytopenia ([place]) ?Platelet count relatively on the lower side. ?No active bleeding. ?Monitor. ? ?Alzheimer disease ([place]) ?Chronic. ?Continue Namenda. ? ?Anxiety with depression ?On Prozac.  We will continue. ? ?Morbid obesity ([place]) ?Difficult to assess BMI in the hospital. ?Known history of morbid obesity.  Known history of OSA not on any CPAP.  Placing the patient at high risk of poor outcome. ? ?GERD ?Continue PPI. ? ?Hyperlipidemia ?Not a new medication.  Monitor. ? ?Subjective: Limited history.  Family help interpreting.  No nausea no vomiting.  No shortness of breath no chest pain.  No diarrhea. ? ?Physical  Exam: ?Vitals:  ? 08/01/21 0854 08/01/21 1321 08/01/21 1328 08/01/21 1516  ?BP: (!) 166/80 (!) 97/49 (!) 90/48 107/86  ?Pulse: 66 68  67  ?Resp: '16 18  16  '$ ?Temp:  98 ?F (36.7 ?C)    ?TempSrc:  Oral    ?SpO2: 100% 100%    ?Height:      ? ?General: Appear in mild distress; no visible Abnormal Neck Mass Or lumps, Conjunctiva normal ?Cardiovascular: S1 and S2 Present, no Murmur, ?Respiratory: good respiratory effort, Bilateral Air entry present and CTA, no Crackles, no wheezes ?Abdomen: Bowel Sound present, nontender. ?Extremities: trace Pedal edema ?Neurology: alert and oriented to self. ?Gait not checked due to patient safety concerns  ? ?Data Reviewed: ?I have Reviewed nursing notes, Vitals, and Lab results since pt's last encounter. Pertinent lab results CBC and CMP ?I have ordered test including CBC and BMP ?I have discussed pt's care plan and test results with orthopedics.  ? ?Family Communication: Family at bedside ? ?Disposition: ?Status is: Inpatient ?Remains inpatient appropriate because: Ongoing issues with blood pressure with history of orthostatic hypotension and hypertension.  Awaiting sensitivities on the urine culture to guide therapy. ? ?Author: ?Berle Mull, MD ?08/01/2021 6:45 PM ? ?For on call review [email or web address]. ?

## 2021-08-01 NOTE — Progress Notes (Signed)
? ? ?Subjective: ?Patient reports pain as mild to moderate. Much better controlled today than yesterday before surgery. Tolerating diet. Not taking in a lot of fluids. Urine still dark amber in canister. Getting ABX for UTI. No CP, SOB. Feels warm to the touch, face and chest red. Unable to mobilize OOB but was able to sit on EOB with PT/OT for a short before before becoming hypotensive and needing to lay back down. ? ?Objective:  ? ?VITALS:   ?Vitals:  ? 07/31/21 2137 08/01/21 0225 08/01/21 0530 08/01/21 0854  ?BP: (!) 152/73 (!) 178/95 (!) 184/74 (!) 166/80  ?Pulse: 69 (!) 59 61 66  ?Resp: '18 16 20 16  '$ ?Temp: 97.7 ?F (36.5 ?C) (!) 97.5 ?F (36.4 ?C) 97.6 ?F (36.4 ?C)   ?TempSrc: Oral Oral Oral   ?SpO2: 95% 99% 99% 100%  ?Height:      ? ?CBC Latest Ref Rng & Units 07/31/2021 07/30/2021 07/30/2021  ?WBC 4.0 - 10.5 K/uL 8.7 - 9.8  ?Hemoglobin 12.0 - 15.0 g/dL 12.3 13.6 14.1  ?Hematocrit 36.0 - 46.0 % 40.0 40.0 44.9  ?Platelets 150 - 400 K/uL 130(L) - 157  ? ?BMP Latest Ref Rng & Units 07/31/2021 07/30/2021 07/30/2021  ?Glucose 70 - 99 mg/dL 109(H) 119(H) 105(H)  ?BUN 8 - 23 mg/dL '12 10 11  '$ ?Creatinine 0.44 - 1.00 mg/dL 0.82 0.80 0.60  ?Sodium 135 - 145 mmol/L 135 141 137  ?Potassium 3.5 - 5.1 mmol/L 3.8 4.1 5.6(H)  ?Chloride 98 - 111 mmol/L 98 112(H) 101  ?CO2 22 - 32 mmol/L 28 - 30  ?Calcium 8.9 - 10.3 mg/dL 8.6(L) - 8.9  ? ?Intake/Output   ?   03/10 0701 ?03/11 0700 03/11 0701 ?03/12 0700  ? P.O. 360   ? I.V. 700   ? IV Piggyback 400   ? Total Intake 1460   ? Urine 1200   ? Blood 50   ? Total Output 1250   ? Net +210   ?     ?  ? ? ?Physical Exam: ?General: NAD.  Laying in bed, calm, looks slightly uncomfortable. No verbal response to my questions but will shake head yes or no.  ?Resp: No increased wob ?Cardio: regular rate and rhythm ?ABD soft ?Neurologically intact ?MSK ?Neurovascularly intact ?Sensation intact distally ?Intact pulses distally ?Dorsiflexion/Plantar flexion intact ?Incision: dressing C/D/I ?KI in  place ?Compartments soft and compressible ? ?Assessment: ?1 Day Post-Op  ?S/P Procedure(s) (LRB): ?OPEN REDUCTION INTERNAL FIXATION (ORIF) TIBIAL PLATEAU (Right) ?by Dr. Ernesta Amble. Murphy on 07/31/21 ? ?Principal Problem: ?  Tibia/fibula fracture, right, closed, initial encounter ?Active Problems: ?  Hyperlipidemia ?  GERD ?  Morbid obesity ([place]) ?  Alzheimer disease ([place]) ?  Orthostatic hypotension ?  Anxiety with depression ?  Acute UTI (urinary tract infection) ? ? ?Plan: ?Encourage increased fluid intake. May need continued IVF if doesn't improve intake. ?Daughter feels patient might be having allergic reaction and is looking red. I think she is just very warm. There are several layers on her. Benadryl is ordered PRN if needed.   ?Work with PT/OT as able ?Advance diet ?Incentive Spirometry ?Elevate and Apply ice ? ?Weightbearing: NWB RLE, no right knee ROM x 2 weeks ?Insicional and dressing care: Dressings left intact until follow-up and Reinforce dressings as needed ?Orthopedic device(s):  KI ?Showering: Keep dressing dry ?VTE prophylaxis: Lovenox '40mg'$  qd  while inpatient, can switch to ASA '81mg'$  bid x 30 days upon d/c , SCDs, ambulation ?Pain control: limit narcotics  due to dementia, seems to be doing well with Tylenol ?Follow - up plan: 2 weeks ?Contact information:  Edmonia Lynch MD, Aggie Moats PA-C ? ?Dispo: Home most likely as daughter declined SNF after previous surgery we did in September. Depends on PT/OT evals and if she is medically stable to be taken care of at home. ? ? ? ? ?Britt Bottom, PA-C ?Office [phone] ?08/01/2021, 11:49 AM  ?

## 2021-08-01 NOTE — Assessment & Plan Note (Addendum)
E. coli and Klebsiella growing in urine. ?Was on IV ceftriaxone.  Sensitive to Keflex.  Will transition for a total 7-day treatment course. ?

## 2021-08-01 NOTE — Assessment & Plan Note (Signed)
Chronic. ?Continue Namenda. ?

## 2021-08-01 NOTE — TOC Progression Note (Signed)
Transition of Care (TOC) - Progression Note  ? ? ?Patient Details  ?Name: Sabrina Page ?MRN: [id number] ?Date of Birth: 02-11-1934 ? ?Transition of Care (TOC) CM/SW Contact  ?Ross Ludwig, LCSW ?Phone Number: ?08/01/2021, 5:38 PM ? ?Clinical Narrative:    ? ?Patient currently open to [facility] for home health services.  CSW to continue to follow patient's progress throughout discharge planning. ? ?  ?  ? ?Expected Discharge Plan and Services ?  ?  ?  ?  ?  ?                ?  ?  ?  ?  ?  ?  ?  ?  ?  ?  ? ? ?Social Determinants of Health (SDOH) Interventions ?  ? ?Readmission Risk Interventions ?No flowsheet data found. ? ?

## 2021-08-01 NOTE — Hospital Course (Addendum)
Past medical history of anxiety depression, chronic diastolic CHF, COPD, Alzheimer's dementia, morbid obesity, OSA.  No evidence of CKD. Presents to the hospital with a mechanical fall found to have tibia-fibula fracture of the right leg. Underwent ORIF by Dr. Percell Miller on 3/10. Also found to have UTI. Patient is medically ready for discharge.  Awaiting disposition.

## 2021-08-01 NOTE — Assessment & Plan Note (Addendum)
Patient has chronic orthostatic hypotension and is on Florinef. ?We will continue. ?Also on midodrine as needed which I will continue for now. ?

## 2021-08-01 NOTE — Op Note (Signed)
07/31/2021 ? ?12:40 PM ? ?PATIENT:  Sabrina Page   ? ?PRE-OPERATIVE DIAGNOSIS:  TIBIAL PLATEAU FRACTURE ? ?POST-OPERATIVE DIAGNOSIS:  Same ? ?PROCEDURE:  OPEN REDUCTION INTERNAL FIXATION (ORIF) TIBIAL PLATEAU ? ?SURGEON:  Renette Butters, MD ? ?ASSISTANT: Aggie Moats, PA-C, he was present and scrubbed throughout the case, critical for completion in a timely fashion, and for retraction, instrumentation, and closure. ? ? ?ANESTHESIA:   gen ? ?PREOPERATIVE INDICATIONS:  Sabrina Page is a  88 y.o. female with a diagnosis of Stone who failed conservative measures and elected for surgical management.   ? ?The risks benefits and alternatives were discussed with the patient preoperatively including but not limited to the risks of infection, bleeding, nerve injury, cardiopulmonary complications, the need for revision surgery, among others, and the patient was willing to proceed. ? ?OPERATIVE IMPLANTS: stryker plate ? ?OPERATIVE FINDINGS: bicondylar plateau fx ? ?BLOOD LOSS: 100 ? ?COMPLICATIONS: none ? ?TOURNIQUET TIME: 41mn ? ?OPERATIVE PROCEDURE:  Patient was identified in the preoperative holding area and site was marked by me She was transported to the operating theater and placed on the table in supine position taking care to pad all bony prominences. After a preincinduction time out anesthesia was induced. The right lower extremity was prepped and draped in normal sterile fashion and a pre-incision timeout was performed. She received ancef for preoperative antibiotics.  ? ?I made a curvilinear incision over the lateral aspect of the proximal tibia. ? ?I next dissected to the fascial and IT band layer I incised the IT band longitudinally and elevated the anterior portion off of Gertie's for placement of her plate. ? ?Next I performed an anterior compartment fasciotomy to allow for swelling and placement of plate ? ?Next I performed a submeniscal arthrotomy to allow for appropriate  placement of plate there was a small meniscus tear that I repaired with an arthrotomy with a FiberWire. ? ?I next pinned the plate into place and was happy with the alignment on 4 views of her knee. ? ?I placed a lag screw proximally and distally and then locking screws around these. ? ?I then took for x-rays of the knee and was very happy with the alignment of the hardware and fracture reduction. ? ?Fibular alignment was near anatomic ? ?I thoroughly irrigated the joint and incision and closed the in layers. ? ?POST OPERATIVE PLAN: Nonweightbearing chemical DVT prophylaxis knee immobilizer full-time ? ? ? ?

## 2021-08-01 NOTE — Assessment & Plan Note (Signed)
Platelet count relatively on the lower side. ?No active bleeding. ?Monitor. ?

## 2021-08-01 NOTE — Assessment & Plan Note (Signed)
On Prozac.  We will continue. ?
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
## 2021-08-01 NOTE — Assessment & Plan Note (Addendum)
Presents with a mechanical fall. ?No head injury. ?Underwent open reduction internal fixation of tibial plateau to by Dr. Percell Miller on 3/10. ?Plan is nonweightbearing of right lower extremity. ?Knee immobilizer full-time. ?Pain medication and DVT prophylaxis per orthopedics. ?PT OT recommends home with home health. ?At baseline patient has 24/7 caregiver and requires total assist with mechanical lift for transfers. ?

## 2021-08-01 NOTE — Assessment & Plan Note (Addendum)
Difficult to assess BMI in the hospital. ?Known history of OSA not on any CPAP. ?Placing the patient at high risk of poor outcome. ?

## 2021-08-01 NOTE — Assessment & Plan Note (Signed)
Continue PPI ?

## 2021-08-01 NOTE — Plan of Care (Signed)

## 2021-08-01 NOTE — Progress Notes (Signed)
Notified the on call provider on two occasions for patient's elevated BP. The first BP was 178/95 with HR of 59. The second BP was 184/74 with heart rate of 61. Notified the on call provider since patient did not have anything PRN. On call provider did not respond with any new orders. ?

## 2021-08-01 NOTE — Assessment & Plan Note (Addendum)
Not on any medication. °Monitor. °
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
## 2021-08-02 DIAGNOSIS — S82201A Unspecified fracture of shaft of right tibia, initial encounter for closed fracture: Secondary | ICD-10-CM | POA: Diagnosis not present

## 2021-08-02 DIAGNOSIS — S82401A Unspecified fracture of shaft of right fibula, initial encounter for closed fracture: Secondary | ICD-10-CM | POA: Diagnosis not present

## 2021-08-02 LAB — CBC WITH DIFFERENTIAL/PLATELET
Abs Immature Granulocytes: 0.07 10*3/uL (ref 0.00–0.07)
Basophils Absolute: 0.1 10*3/uL (ref 0.0–0.1)
Basophils Relative: 1 %
Eosinophils Absolute: 0.4 10*3/uL (ref 0.0–0.5)
Eosinophils Relative: 6 %
HCT: 36.5 % (ref 36.0–46.0)
Hemoglobin: 11.4 g/dL — ABNORMAL LOW (ref 12.0–15.0)
Immature Granulocytes: 1 %
Lymphocytes Relative: 23 %
Lymphs Abs: 1.6 10*3/uL (ref 0.7–4.0)
MCH: 28.5 pg (ref 26.0–34.0)
MCHC: 31.2 g/dL (ref 30.0–36.0)
MCV: 91.3 fL (ref 80.0–100.0)
Monocytes Absolute: 0.6 10*3/uL (ref 0.1–1.0)
Monocytes Relative: 9 %
Neutro Abs: 4.2 10*3/uL (ref 1.7–7.7)
Neutrophils Relative %: 60 %
Platelets: 132 10*3/uL — ABNORMAL LOW (ref 150–400)
RBC: 4 MIL/uL (ref 3.87–5.11)
RDW: 13.5 % (ref 11.5–15.5)
WBC: 6.9 10*3/uL (ref 4.0–10.5)
nRBC: 0 % (ref 0.0–0.2)

## 2021-08-02 LAB — BASIC METABOLIC PANEL
Anion gap: 8 (ref 5–15)
BUN: 17 mg/dL (ref 8–23)
CO2: 30 mmol/L (ref 22–32)
Calcium: 8.9 mg/dL (ref 8.9–10.3)
Chloride: 101 mmol/L (ref 98–111)
Creatinine, Ser: 0.71 mg/dL (ref 0.44–1.00)
GFR, Estimated: >60 mL/min (ref >60)
Glucose, Bld: 82 mg/dL (ref 70–99)
Potassium: 3.5 mmol/L (ref 3.5–5.1)
Sodium: 139 mmol/L (ref 135–145)

## 2021-08-02 LAB — MAGNESIUM: Magnesium: 2.2 mg/dL (ref 1.7–2.4)

## 2021-08-02 NOTE — Progress Notes (Signed)
? ? ?Subjective: ?Patient reports pain as mild to moderate. Much better controlled than before surgery. Tolerating diet. Urine still dark amber in canister. Getting ABX for UTI. No CP, SOB. Unable to mobilize OOB but was able to sit on EOB with PT/OT for a short time. Looks better today.  ? ?Objective:  ? ?VITALS:   ?Vitals:  ? 08/01/21 1934 08/02/21 0534 08/02/21 0840 08/02/21 1220  ?BP: 138/64 122/62 (!) 148/75 113/62  ?Pulse: 70 60 69 65  ?Resp: '18 18 15 16  '$ ?Temp: 98.3 ?F (36.8 ?C) 98 ?F (36.7 ?C)  98.4 ?F (36.9 ?C)  ?TempSrc:    Oral  ?SpO2: 99% 100% 100% 91%  ?Height:      ? ?CBC Latest Ref Rng & Units 08/02/2021 08/01/2021 07/31/2021  ?WBC 4.0 - 10.5 K/uL 6.9 8.4 8.7  ?Hemoglobin 12.0 - 15.0 g/dL 11.4(L) 11.5(L) 12.3  ?Hematocrit 36.0 - 46.0 % 36.5 35.3(L) 40.0  ?Platelets 150 - 400 K/uL 132(L) 138(L) 130(L)  ? ?BMP Latest Ref Rng & Units 08/02/2021 08/01/2021 07/31/2021  ?Glucose 70 - 99 mg/dL 82 132(H) 109(H)  ?BUN 8 - 23 mg/dL '17 16 12  '$ ?Creatinine 0.44 - 1.00 mg/dL 0.71 0.68 0.82  ?Sodium 135 - 145 mmol/L 139 135 135  ?Potassium 3.5 - 5.1 mmol/L 3.5 3.9 3.8  ?Chloride 98 - 111 mmol/L 101 102 98  ?CO2 22 - 32 mmol/L '30 28 28  '$ ?Calcium 8.9 - 10.3 mg/dL 8.9 8.2(L) 8.6(L)  ? ?Intake/Output   ?   03/11 0701 ?03/12 0700 03/12 0701 ?03/13 0700  ? P.O. 680 240  ? I.V.    ? IV Piggyback 0   ? Total Intake 680 240  ? Urine 600   ? Stool 0   ? Blood    ? Total Output 600   ? Net +80 +240  ?     ? Urine Occurrence 1 x   ? Stool Occurrence 1 x   ?  ? ? ?Physical Exam: ?General: NAD.  Laying in bed, calm, looks much more comfortable today  ?Resp: No increased wob ?Cardio: regular rate and rhythm ?ABD soft ?Neurologically intact ?MSK ?Neurovascularly intact ?Sensation intact distally ?Intact pulses distally ?Dorsiflexion/Plantar flexion intact ?Incision: dressing C/D/I ?KI in place ?Compartments soft and compressible ? ?Assessment: ?2 Days Post-Op  ?S/P Procedure(s) (LRB): ?OPEN REDUCTION INTERNAL FIXATION (ORIF) TIBIAL  PLATEAU (Right) ?by Dr. Ernesta Amble. Murphy on 07/31/21 ? ?Principal Problem: ?  Tibia/fibula fracture, right, closed, initial encounter ?Active Problems: ?  Hyperlipidemia ?  GERD ?  Morbid obesity ([place]) ?  Alzheimer disease ([place]) ?  Orthostatic hypotension ?  Anxiety with depression ?  Acute UTI (urinary tract infection) ?  Thrombocytopenia ([place]) ? ? ?Plan: ?Encourage increased fluid intake. May need continued IVF if doesn't improve intake. ?Work with PT/OT as able ?Advance diet ?Incentive Spirometry ?Elevate and Apply ice ? ?Weightbearing: NWB RLE, no right knee ROM x 2 weeks ?Insicional and dressing care: Dressings left intact until follow-up and Reinforce dressings as needed ?Orthopedic device(s):  KI ?Showering: Keep dressing dry ?VTE prophylaxis: Lovenox '40mg'$  qd  while inpatient, can switch to ASA '81mg'$  bid x 30 days upon d/c , SCDs, ambulation ?Pain control: limit narcotics due to dementia, seems to be doing well with Tylenol ?Follow - up plan: 2 weeks ?Contact information:  Edmonia Lynch MD, Aggie Moats PA-C ? ?Dispo: Home most likely as daughter declined SNF after previous surgery we did in September. PT/OT recommends HHPT/OT. ? ? ? ? ?Brice Kossman  Georgianne Fick, PA-C ?Office [phone] ?08/02/2021, 1:03 PM  ?

## 2021-08-02 NOTE — Progress Notes (Signed)
?  Progress Note ? ? ?PatientCheyeanne Page QMG:[id number] DOB: [birth year] DOA: 07/30/2021     Hospitalization day: 2 ?DOS: the patient was seen and examined on 08/02/2021 ? ?Brief hospital course: ?Past medical history of anxiety depression, chronic diastolic CHF, COPD, Alzheimer's dementia, morbid obesity, OSA.  No evidence of CKD. ?Presents to the hospital with a mechanical fall found to have tibia-fibula fracture of the right leg. ?Underwent ORIF by Dr. Percell Miller on 3/10. ?Also found to have UTI. ? ?Assessment and Plan: ?* Tibia/fibula fracture, right, closed, initial encounter ?Presents with a mechanical fall. ?No head injury. ?Underwent open reduction internal fixation of tibial plateau to by Dr. Percell Miller on 3/10. ?Plan is nonweightbearing of right lower extremity. ?Knee immobilizer full-time. ?Pain medication and DVT prophylaxis per orthopedics. ?PT OT recommends home with home health.  At baseline patient has 24/7 caregiver and requires total assist with mechanical lift for transfers. ? ?Acute UTI (urinary tract infection) ?E. coli and Klebsiella growing in urine. ?Currently on IV ceftriaxone.  Monitor for sensitivity. ? ?Orthostatic hypotension ?Patient has chronic orthostatic hypotension and is on Florinef. ?We will continue. ?Also on midodrine as needed which I will continue for now. ? ?Thrombocytopenia ([place]) ?Platelet count relatively on the lower side. ?No active bleeding. ?Monitor. ? ?Alzheimer disease ([place]) ?Chronic. ?Continue Namenda. ? ?Anxiety with depression ?On Prozac.  We will continue. ? ?Morbid obesity ([place]) ?Difficult to assess BMI in the hospital. ?Known history of morbid obesity.  Known history of OSA not on any CPAP.  Placing the patient at high risk of poor outcome. ? ?GERD ?Continue PPI. ? ?Hyperlipidemia ?Not a new medication.  Monitor. ? ? ?Patient is eating 75 to 100% of her meals.  Her labs are not showing any dehydration. ? ?Subjective: Denies any acute complaint.  No nausea no  vomiting no fever no chills. ? ?Physical Exam: ?Vitals:  ? 08/01/21 1934 08/02/21 0534 08/02/21 0840 08/02/21 1220  ?BP: 138/64 122/62 (!) 148/75 113/62  ?Pulse: 70 60 69 65  ?Resp: '18 18 15 16  '$ ?Temp: 98.3 ?F (36.8 ?C) 98 ?F (36.7 ?C)  98.4 ?F (36.9 ?C)  ?TempSrc:    Oral  ?SpO2: 99% 100% 100% 91%  ?Height:      ? ?General: Appear in mild distress; no visible Abnormal Neck Mass Or lumps, Conjunctiva normal ?Cardiovascular: S1 and S2 Present, no Murmur, ?Respiratory: good respiratory effort, Bilateral Air entry present and CTA, no Crackles, no wheezes ?Abdomen: Bowel Sound present, nontender ?Extremities: trace Pedal edema ?Neurology: alert and oriented to place and person ?Gait not checked due to patient safety concerns  ? ?Data Reviewed: ?I have Reviewed nursing notes, Vitals, and Lab results since pt's last encounter. Pertinent lab results CBC and BMP ?I have ordered test including CBC and BMP   ? ?Family Communication: Family at bedside. ? ?Disposition: ?Status is: Inpatient ?Remains inpatient appropriate because: Awaiting sensitivities on the urine culture for antibiotics. ? ?Author: ?Berle Mull, MD ?08/02/2021 6:29 PM ? ?For on call review [email or web address] ?

## 2021-08-03 ENCOUNTER — Encounter (HOSPITAL_COMMUNITY): Payer: Self-pay | Admitting: Orthopedic Surgery

## 2021-08-03 DIAGNOSIS — S82201A Unspecified fracture of shaft of right tibia, initial encounter for closed fracture: Secondary | ICD-10-CM | POA: Diagnosis not present

## 2021-08-03 DIAGNOSIS — S82401A Unspecified fracture of shaft of right fibula, initial encounter for closed fracture: Secondary | ICD-10-CM | POA: Diagnosis not present

## 2021-08-03 LAB — CBC WITH DIFFERENTIAL/PLATELET
Abs Immature Granulocytes: 0.06 10*3/uL (ref 0.00–0.07)
Basophils Absolute: 0.1 10*3/uL (ref 0.0–0.1)
Basophils Relative: 1 %
Eosinophils Absolute: 0.4 10*3/uL (ref 0.0–0.5)
Eosinophils Relative: 8 %
HCT: 33.8 % — ABNORMAL LOW (ref 36.0–46.0)
Hemoglobin: 10.8 g/dL — ABNORMAL LOW (ref 12.0–15.0)
Immature Granulocytes: 1 %
Lymphocytes Relative: 34 %
Lymphs Abs: 1.7 10*3/uL (ref 0.7–4.0)
MCH: 29.2 pg (ref 26.0–34.0)
MCHC: 32 g/dL (ref 30.0–36.0)
MCV: 91.4 fL (ref 80.0–100.0)
Monocytes Absolute: 0.5 10*3/uL (ref 0.1–1.0)
Monocytes Relative: 10 %
Neutro Abs: 2.3 10*3/uL (ref 1.7–7.7)
Neutrophils Relative %: 46 %
Platelets: 153 10*3/uL (ref 150–400)
RBC: 3.7 MIL/uL — ABNORMAL LOW (ref 3.87–5.11)
RDW: 13.7 % (ref 11.5–15.5)
WBC: 4.9 10*3/uL (ref 4.0–10.5)
nRBC: 0 % (ref 0.0–0.2)

## 2021-08-03 LAB — BASIC METABOLIC PANEL
Anion gap: 8 (ref 5–15)
BUN: 13 mg/dL (ref 8–23)
CO2: 30 mmol/L (ref 22–32)
Calcium: 8.5 mg/dL — ABNORMAL LOW (ref 8.9–10.3)
Chloride: 100 mmol/L (ref 98–111)
Creatinine, Ser: 0.64 mg/dL (ref 0.44–1.00)
GFR, Estimated: >60 mL/min (ref >60)
Glucose, Bld: 83 mg/dL (ref 70–99)
Potassium: 3.6 mmol/L (ref 3.5–5.1)
Sodium: 138 mmol/L (ref 135–145)

## 2021-08-03 LAB — MAGNESIUM: Magnesium: 2 mg/dL (ref 1.7–2.4)

## 2021-08-03 LAB — URINE CULTURE: Culture: >=100000 — AB

## 2021-08-03 MED ORDER — CEPHALEXIN 500 MG PO CAPS
500.0000 mg | ORAL_CAPSULE | Freq: Two times a day (BID) | ORAL | Status: DC
  Administered 2021-08-03 – 2021-08-04 (×4): 500 mg via ORAL
  Filled 2021-08-03 (×4): qty 1

## 2021-08-03 NOTE — TOC Transition Note (Signed)
Transition of Care (TOC) - CM/SW Discharge Note ? ? ?Patient Details  ?Name: Sabrina Page ?MRN: [id number] ?Date of Birth: 09/26/1933 ? ?Transition of Care [facility]) CM/SW Contact:  ?Dessa Phi, RN ?Phone Number: ?08/03/2021, 1:50 PM ? ? ?Clinical Narrative:  [name] agree to d/c in amw/HHC [facility] already following;PTAR confirmed address.[name] states exterminator to arrive today to [address].  ? ? ? ?Final next level of care: [place] ?Barriers to Discharge: No Barriers Identified ? ? ?Patient Goals and CMS Choice ?Patient states their goals for this hospitalization and ongoing recovery are:: Home ?CMS Medicare.gov Compare Post Acute Care list provided to:: Patient Represenative (must comment) ([name] [phone]) ?Choice offered to / list presented to : Adult Children ? ?Discharge Placement ?  ?           ?  ?  ?  ?  ? ?Discharge Plan and Services ?  ?Discharge Planning Services: CM Consult ?Post Acute Care Choice: Home Health          ?  ?  ?  ?  ?  ?HH Arranged: PT, OT, Social Work ?[place] Agency: [facility] ?Date HH Agency Contacted: 08/03/21 ?Time [place]: [phone] ?Representative spoke with at [place]: [name] ? ?Social Determinants of Health (SDOH) Interventions ?  ? ? ?Readmission Risk Interventions ?No flowsheet data found. ? ? ? ? ?

## 2021-08-03 NOTE — Progress Notes (Signed)
?  Progress Note ? ? ?PatientOlamide Page JME:[id number] DOB: [birth year] DOA: 07/30/2021     Hospitalization day: 3 ?DOS: the patient was seen and examined on 08/03/2021 ? ?Brief hospital course: ?Past medical history of anxiety depression, chronic diastolic CHF, COPD, Alzheimer's dementia, morbid obesity, OSA.  No evidence of CKD. ?Presents to the hospital with a mechanical fall found to have tibia-fibula fracture of the right leg. ?Underwent ORIF by Dr. Percell Miller on 3/10. ?Also found to have UTI. ?Patient is medically ready for discharge.  Awaiting disposition. ? ?Assessment and Plan: ?* Tibia/fibula fracture, right, closed, initial encounter ?Presents with a mechanical fall. ?No head injury. ?Underwent open reduction internal fixation of tibial plateau to by Dr. Percell Miller on 3/10. ?Plan is nonweightbearing of right lower extremity. ?Knee immobilizer full-time. ?Pain medication and DVT prophylaxis per orthopedics. ?PT OT recommends home with home health.  At baseline patient has 24/7 caregiver and requires total assist with mechanical lift for transfers. ? ?Acute UTI (urinary tract infection) ?E. coli and Klebsiella growing in urine. ?Was on IV ceftriaxone.  Sensitive to Keflex.  Will transition for a total 7-day treatment course. ? ?Orthostatic hypotension ?Patient has chronic orthostatic hypotension and is on Florinef. ?We will continue. ?Also on midodrine as needed which I will continue for now. ? ?Thrombocytopenia ([place]) ?Platelet count relatively on the lower side. ?No active bleeding. ?Monitor. ? ?Alzheimer disease ([place]) ?Chronic. ?Continue Namenda. ? ?Anxiety with depression ?On Prozac.  We will continue. ? ?Morbid obesity ([place]) ?Difficult to assess BMI in the hospital. ?Known history of OSA not on any CPAP. ?Placing the patient at high risk of poor outcome. ? ?GERD ?Continue PPI. ? ?Hyperlipidemia ?Not on any medication.  Monitor. ? ? ?Subjective: No nausea or vomiting.  No fever no chills.  Continues to  have pain at the surgical site.  Passing gas.  No BM. ? ?Physical Exam: ?Vitals:  ? 08/02/21 1220 08/02/21 2125 08/03/21 0452 08/03/21 1352  ?BP: 113/62 (!) 123/55 (!) 156/63 (!) 106/44  ?Pulse: 65 70 65 69  ?Resp: '16 18 18 16  '$ ?Temp: 98.4 ?F (36.9 ?C) 98.5 ?F (36.9 ?C) 98.5 ?F (36.9 ?C) 98.2 ?F (36.8 ?C)  ?TempSrc: Oral Oral Oral Oral  ?SpO2: 91% 94% 98% 96%  ?Height:      ? ?General: Appear in mild distress; no visible Abnormal Neck Mass Or lumps, Conjunctiva normal ?Cardiovascular: S1 and S2 Present, no Murmur, ?Respiratory: good respiratory effort, Bilateral Air entry present and CTA, no Crackles, no wheezes ?Abdomen: Bowel Sound present, nontender ?Extremities: trace Pedal edema ?Neurology: alert and oriented to self ?Gait not checked due to patient safety concerns  ? ?Data Reviewed: ?I have Reviewed nursing notes, Vitals, and Lab results since pt's last encounter. Pertinent lab results CBC and BMP ?I have ordered test including CBC and BMP ?I have discussed pt's care plan and test results with orthopedics.  ? ?Family Communication: Family at bedside ? ?Disposition: ?Status is: Inpatient ?Remains inpatient appropriate because: Unsafe discharge.  Home with home health tomorrow.  Once her apartment is ready to accept her. ? ?Author: ?Berle Mull, MD ?08/03/2021 3:15 PM ? ?For on call review [email or web address] ?

## 2021-08-03 NOTE — Anesthesia Postprocedure Evaluation (Signed)
Anesthesia Post Note ? ?Patient: Jaquesha Boroff ? ?Procedure(s) Performed: OPEN REDUCTION INTERNAL FIXATION (ORIF) TIBIAL PLATEAU (Right: Leg Lower) ? ?  ? ?Patient location during evaluation: PACU ?Anesthesia Type: General ?Level of consciousness: awake and alert ?Pain management: pain level controlled ?Vital Signs Assessment: post-procedure vital signs reviewed and stable ?Respiratory status: spontaneous breathing, nonlabored ventilation, respiratory function stable and patient connected to nasal cannula oxygen ?Cardiovascular status: blood pressure returned to baseline and stable ?Postop Assessment: no apparent nausea or vomiting ?Anesthetic complications: no ? ? ?No notable events documented. ? ?Last Vitals:  ?Vitals:  ? 08/02/21 2125 08/03/21 0452  ?BP: (!) 123/55 (!) 156/63  ?Pulse: 70 65  ?Resp: 18 18  ?Temp: 36.9 ?C 36.9 ?C  ?SpO2: 94% 98%  ?  ?Last Pain:  ?Vitals:  ? 08/03/21 0452  ?TempSrc: Oral  ?PainSc:   ? ? ?  ?  ?  ?  ?  ?  ? ?Suzette Battiest E ? ? ? ? ?

## 2021-08-04 LAB — CBC WITH DIFFERENTIAL/PLATELET
Abs Immature Granulocytes: 0.04 10*3/uL (ref 0.00–0.07)
Basophils Absolute: 0 10*3/uL (ref 0.0–0.1)
Basophils Relative: 1 %
Eosinophils Absolute: 0.4 10*3/uL (ref 0.0–0.5)
Eosinophils Relative: 8 %
HCT: 35.2 % — ABNORMAL LOW (ref 36.0–46.0)
Hemoglobin: 11.1 g/dL — ABNORMAL LOW (ref 12.0–15.0)
Immature Granulocytes: 1 %
Lymphocytes Relative: 34 %
Lymphs Abs: 1.5 10*3/uL (ref 0.7–4.0)
MCH: 28.8 pg (ref 26.0–34.0)
MCHC: 31.5 g/dL (ref 30.0–36.0)
MCV: 91.4 fL (ref 80.0–100.0)
Monocytes Absolute: 0.5 10*3/uL (ref 0.1–1.0)
Monocytes Relative: 10 %
Neutro Abs: 2.1 10*3/uL (ref 1.7–7.7)
Neutrophils Relative %: 46 %
Platelets: 150 10*3/uL (ref 150–400)
RBC: 3.85 MIL/uL — ABNORMAL LOW (ref 3.87–5.11)
RDW: 13.5 % (ref 11.5–15.5)
WBC: 4.6 10*3/uL (ref 4.0–10.5)
nRBC: 0 % (ref 0.0–0.2)

## 2021-08-04 LAB — BASIC METABOLIC PANEL
Anion gap: 8 (ref 5–15)
BUN: 12 mg/dL (ref 8–23)
CO2: 31 mmol/L (ref 22–32)
Calcium: 8.6 mg/dL — ABNORMAL LOW (ref 8.9–10.3)
Chloride: 100 mmol/L (ref 98–111)
Creatinine, Ser: 0.5 mg/dL (ref 0.44–1.00)
GFR, Estimated: >60 mL/min (ref >60)
Glucose, Bld: 97 mg/dL (ref 70–99)
Potassium: 3.7 mmol/L (ref 3.5–5.1)
Sodium: 139 mmol/L (ref 135–145)

## 2021-08-04 LAB — MAGNESIUM: Magnesium: 2.1 mg/dL (ref 1.7–2.4)

## 2021-08-04 MED ORDER — BISACODYL 10 MG RE SUPP
10.0000 mg | Freq: Every day | RECTAL | 0 refills | Status: AC | PRN
Start: 2021-08-04 — End: ?

## 2021-08-04 MED ORDER — CEPHALEXIN 500 MG PO CAPS: 500.0000 mg | ORAL_CAPSULE | Freq: Two times a day (BID) | ORAL | 0 refills | Status: AC

## 2021-08-04 MED ORDER — ADULT MULTIVITAMIN W/MINERALS CH: 1.0000 | ORAL_TABLET | Freq: Every day | ORAL | 0 refills | Status: AC

## 2021-08-04 MED ORDER — TRAMADOL HCL 50 MG PO TABS: 50.0000 mg | ORAL_TABLET | Freq: Two times a day (BID) | ORAL | 0 refills | Status: AC | PRN

## 2021-08-04 MED ORDER — RIVAROXABAN 10 MG PO TABS: 10.0000 mg | ORAL_TABLET | Freq: Every day | ORAL | 0 refills | Status: DC

## 2021-08-04 MED ORDER — ENSURE ENLIVE PO LIQD: 237.0000 mL | Freq: Three times a day (TID) | ORAL | 12 refills | Status: AC

## 2021-08-04 MED ORDER — ASPIRIN 81 MG PO TBEC: 81.0000 mg | DELAYED_RELEASE_TABLET | Freq: Two times a day (BID) | ORAL | 3 refills | Status: AC

## 2021-08-04 MED ORDER — ACETAMINOPHEN 500 MG PO TABS
500.0000 mg | ORAL_TABLET | Freq: Four times a day (QID) | ORAL | 0 refills | Status: DC | PRN
Start: 2021-08-04 — End: 2022-09-20

## 2021-08-04 MED ORDER — METHOCARBAMOL 500 MG PO TABS
500.0000 mg | ORAL_TABLET | Freq: Three times a day (TID) | ORAL | 0 refills | Status: DC | PRN
Start: 2021-08-04 — End: 2022-09-20

## 2021-08-04 NOTE — Progress Notes (Signed)
Discharge instructions and prescription information explained to daughter, [name], over the phone. Patient dressed in paper gown, waiting comfortably in bed for PTAR to arrive. ?

## 2021-08-04 NOTE — TOC Transition Note (Signed)
Transition of Care (TOC) - CM/SW Discharge Note ? ? ?Patient Details  ?Name: Sabrina Page ?MRN: [id number] ?Date of Birth: 08-02-1933 ? ?[facility]) CM/SW Contact:  ?Dessa Phi, RN ?Phone Number: ?08/04/2021, 1:09 PM ? ? ?Clinical Narrative:  confirmed address for PTAR once wt documented will call. No further CM needs.  ? ? ? ?Final next level of care: [place] ?Barriers to Discharge: No Barriers Identified ? ? ?Patient Goals and CMS Choice ?Patient states their goals for this hospitalization and ongoing recovery are:: Home ?CMS Medicare.gov Compare Post Acute Care list provided to:: Patient Represenative (must comment) ([name] [phone]) ?Choice offered to / list presented to : Adult Children ? ?Discharge Placement ?  ?           ?  ?Patient to be transferred to facility by: PTAR ?Name of family member notified: [name] [phone] ?Patient and family notified of of transfer: 08/04/21 ? ?Discharge Plan and Services ?  ?Discharge Planning Services: CM Consult ?Post Acute Care Choice: Home Health          ?  ?  ?  ?  ?  ?HH Arranged: PT, OT, Social Work ?[place] Agency: [facility] ?Date HH Agency Contacted: 08/03/21 ?Time [place]: 7793 ?Representative spoke with at [place]: [name] ? ?Social Determinants of Health (SDOH) Interventions ?  ? ? ?Readmission Risk Interventions ?No flowsheet data found. ? ? ? ? ?

## 2021-08-04 NOTE — Progress Notes (Signed)
? ? ?  Subjective: ?Patient reports pain as mild. Much better controlled than before surgery. Tolerating diet. Urine still dark amber in canister. Getting ABX for UTI. No CP, SOB. Unable to mobilize OOB but was able to sit on EOB with PT/OT for a short time.  ? ?Objective:  ? ?VITALS:   ?Vitals:  ? 08/03/21 0452 08/03/21 1352 08/03/21 2135 08/04/21 0745  ?BP: (!) 156/63 (!) 106/44 (!) 147/69 (!) 186/82  ?Pulse: 65 69 81 63  ?Resp: '18 16 20 15  '$ ?Temp: 98.5 ?F (36.9 ?C) 98.2 ?F (36.8 ?C) 99.6 ?F (37.6 ?C) 97.8 ?F (36.6 ?C)  ?TempSrc: Oral Oral Oral Oral  ?SpO2: 98% 96% 95% 95%  ?Height:      ? ?CBC Latest Ref Rng & Units 08/04/2021 08/03/2021 08/02/2021  ?WBC 4.0 - 10.5 K/uL 4.6 4.9 6.9  ?Hemoglobin 12.0 - 15.0 g/dL 11.1(L) 10.8(L) 11.4(L)  ?Hematocrit 36.0 - 46.0 % 35.2(L) 33.8(L) 36.5  ?Platelets 150 - 400 K/uL 150 153 132(L)  ? ?BMP Latest Ref Rng & Units 08/04/2021 08/03/2021 08/02/2021  ?Glucose 70 - 99 mg/dL 97 83 82  ?BUN 8 - 23 mg/dL '12 13 17  '$ ?Creatinine 0.44 - 1.00 mg/dL 0.50 0.64 0.71  ?Sodium 135 - 145 mmol/L 139 138 139  ?Potassium 3.5 - 5.1 mmol/L 3.7 3.6 3.5  ?Chloride 98 - 111 mmol/L 100 100 101  ?CO2 22 - 32 mmol/L '31 30 30  '$ ?Calcium 8.9 - 10.3 mg/dL 8.6(L) 8.5(L) 8.9  ? ?Intake/Output   ?   03/13 0701 ?03/14 0700 03/14 0701 ?03/15 0700  ? P.O. 180   ? IV Piggyback    ? Total Intake 180   ? Urine 1050   ? Total Output 1050   ? Net -295   ?     ?  ? ? ?Physical Exam: ?General: NAD.  Laying in bed, calm  ?Resp: No increased wob ?Cardio: regular rate and rhythm ?ABD soft ?Neurologically intact ?MSK ?Neurovascularly intact ?Sensation intact distally ?Intact pulses distally ?Dorsiflexion/Plantar flexion intact ?Incision: dressing C/D/I ?KI in place ?Compartments soft and compressible ? ?Assessment: ?4 Days Post-Op  ?S/P Procedure(s) (LRB): ?OPEN REDUCTION INTERNAL FIXATION (ORIF) TIBIAL PLATEAU (Right) ?by Dr. Ernesta Amble. Murphy on 07/31/21 ? ?Principal Problem: ?  Tibia/fibula fracture, right, closed, initial  encounter ?Active Problems: ?  Hyperlipidemia ?  GERD ?  Morbid obesity ([place]) ?  Alzheimer disease ([place]) ?  Orthostatic hypotension ?  Anxiety with depression ?  Acute UTI (urinary tract infection) ?  Thrombocytopenia ([place]) ? ? ?Plan: ?Encourage increased fluid intake ?Work with PT/OT as able ?Advance diet ?Incentive Spirometry ?Elevate and Apply ice ? ?Weightbearing: NWB RLE, no right knee ROM x 2 weeks ?Insicional and dressing care: Dressings left intact until follow-up and Reinforce dressings as needed ?Orthopedic device(s):  KI ?Showering: Keep dressing dry ?VTE prophylaxis: Lovenox '40mg'$  qd  while inpatient, will switch to Xarelto '10mg'$  q x 30 days upon d/c , SCDs, ambulation ?Pain control: limit narcotics due to dementia, seems to be doing well with Tylenol ?Follow - up plan: 2 weeks ?Contact information:  Edmonia Lynch MD, Aggie Moats PA-C ? ?Dispo: Home. PT/OT recommends HHPT/OT. Ok to d/c from an ortho standpoint once medically stable. Will send pain medicines and DVT prophylaxis to pharmacy.  ? ? ? ? ?Britt Bottom, PA-C ?Office [phone] ?08/04/2021, 8:03 AM  ?

## 2021-08-04 NOTE — Discharge Summary (Addendum)
Physician Discharge Summary   Patient: Sabrina Page MRN: [id number] DOB: April 13, 1934  Admit date:     07/30/2021  Discharge date: 08/04/21  Discharge Physician: Lynden Oxford  PCP: Tresa Garter, MD  Recommendations at discharge: Follow-up with PCP and orthopedic as recommended.  Discharge Diagnoses: Principal Problem:   Tibia/fibula fracture, right, closed, initial encounter Active Problems:   Acute UTI (urinary tract infection)   Orthostatic hypotension   Thrombocytopenia (HCC)   Alzheimer disease (HCC)   Hyperlipidemia   GERD   Morbid obesity (HCC)   Anxiety with depression   Hospital Course: Past medical history of anxiety depression, chronic diastolic CHF, COPD, Alzheimer's dementia, morbid obesity, OSA.  No evidence of CKD. Presents to the hospital with a mechanical fall found to have tibia-fibula fracture of the right leg. Underwent ORIF by Dr. Eulah Pont on 3/10. Also found to have UTI. Patient is medically ready for discharge.   Assessment and Plan: * Tibia/fibula fracture, right, closed, initial encounter Presents with a mechanical fall. No head injury. Underwent open reduction internal fixation of tibial plateau to by Dr. Eulah Pont on 3/10. Plan is nonweightbearing of right lower extremity. Knee immobilizer full-time. Pain medication and DVT prophylaxis per orthopedics. PT OT recommends home with home health. At baseline patient has 24/7 caregiver and requires total assist with mechanical lift for transfers.  Acute UTI (urinary tract infection) E. coli and Klebsiella growing in urine. Was on IV ceftriaxone.  Sensitive to Keflex.  Will transition for a total 7-day treatment course.  Orthostatic hypotension Patient has chronic orthostatic hypotension and is on Florinef. We will continue. Also on midodrine as needed which I will continue for now.  Thrombocytopenia (HCC) Platelet count relatively on the lower side. No active  bleeding. Monitor.  Alzheimer disease (HCC) Chronic. Continue Namenda.  Anxiety with depression On Prozac.  We will continue.  Morbid obesity (HCC) Difficult to assess BMI in the hospital. Known history of OSA not on any CPAP. Placing the patient at high risk of poor outcome.  GERD Continue PPI.  Hyperlipidemia Not on any medication.  Monitor.        Pain control - [employer] Controlled Substance Reporting System database was reviewed. and patient was instructed, not to drive, operate heavy machinery, perform activities at heights, swimming or participation in water activities or provide baby-sitting services while on Pain, Sleep and Anxiety Medications; until their outpatient Physician has advised to do so again. Also recommended to not to take more than prescribed Pain, Sleep and Anxiety Medications.   Consultants: Orthopedics Procedures performed:  Open reduction internal fixation by Dr. Eulah Pont.  DISCHARGE MEDICATION: Allergies as of 08/04/2021       Reactions   Atorvastatin Other (See Comments)   REACTION: upset stomach   Enalapril Maleate Other (See Comments)   Unknown reaction   Hctz [hydrochlorothiazide] Other (See Comments)   dizziness   Lovastatin Other (See Comments)   Unknown reaction   Namenda [memantine Hcl]    Feeling bad   Simvastatin Other (See Comments)   REACTION: mouth sores   Topamax [topiramate] Other (See Comments)   syncope        Medication List     STOP taking these medications    HYDROcodone-acetaminophen 5-325 MG tablet Commonly known as: Norco       TAKE these medications    acetaminophen 500 MG tablet Commonly known as: TYLENOL Take 1 tablet (500 mg total) by mouth every 6 (six) hours as needed for mild pain or moderate pain.  What changed: Another medication with the same name was added. Make sure you understand how and when to take each.   acetaminophen 500 MG tablet Commonly known as: TYLENOL Take 1-2 tablets  (500-1,000 mg total) by mouth every 6 (six) hours as needed for mild pain or moderate pain. What changed: You were already taking a medication with the same name, and this prescription was added. Make sure you understand how and when to take each.   aspirin 81 MG EC tablet Take 1 tablet (81 mg total) by mouth 2 (two) times daily. Start after completing xarelto prescription Start taking on: September 04, 2021 What changed:  additional instructions These instructions start on September 04, 2021. If you are unsure what to do until then, ask your doctor or other care provider.   bisacodyl 10 MG suppository Commonly known as: DULCOLAX Place 1 suppository (10 mg total) rectally daily as needed for moderate constipation.   cephALEXin 500 MG capsule Commonly known as: KEFLEX Take 1 capsule (500 mg total) by mouth 2 (two) times daily for 3 days.   docusate sodium 100 MG capsule Commonly known as: Stool Softener Take 1 capsule (100 mg total) by mouth 2 (two) times daily. What changed: when to take this   feeding supplement Liqd Take 237 mLs by mouth 3 (three) times daily between meals.   fludrocortisone 0.1 MG tablet Commonly known as: FLORINEF Take 1 tablet (100 mcg total) by mouth daily. Annual appt due in Oct must see provider for future refills What changed: additional instructions   FLUoxetine 40 MG capsule Commonly known as: PROZAC TAKE 1 CAPSULE BY MOUTH EVERY DAY What changed:  how much to take how to take this when to take this additional instructions   furosemide 20 MG tablet Commonly known as: LASIX Take 1 tablet (20 mg total) by mouth every other day.   ketoconazole 2 % cream Commonly known as: NIZORAL Apply 1 application topically daily. What changed:  when to take this reasons to take this   ketoconazole 200 MG tablet Commonly known as: NIZORAL Take 1 tablet (200 mg total) by mouth daily.   loratadine 10 MG tablet Commonly known as: CLARITIN TAKE 1 TABLET BY MOUTH  EVERY DAY IN THE MORNING What changed:  how much to take how to take this when to take this additional instructions   memantine 10 MG tablet Commonly known as: NAMENDA Take 1 tablet (10 mg total) by mouth 2 (two) times daily. Annual appt due in Oct must see provider for future refills What changed: additional instructions   methocarbamol 500 MG tablet Commonly known as: ROBAXIN Take 1 tablet (500 mg total) by mouth every 8 (eight) hours as needed for muscle spasms. What changed: when to take this   midodrine 10 MG tablet Commonly known as: PROAMATINE Take 0.5 tablets (5 mg total) by mouth 3 (three) times daily as needed (For SBP less than 110). May also take an additional 5mg  if BP gets too low What changed:  when to take this additional instructions   multivitamin with minerals Tabs tablet Take 1 tablet by mouth daily.   omeprazole 20 MG capsule Commonly known as: PRILOSEC Take 2 capsules (40 mg total) by mouth daily.   OVER THE COUNTER MEDICATION Apply 1 application topically daily as needed (knee pain). Hemp Freeze   polyethylene glycol 17 g packet Commonly known as: MIRALAX / GLYCOLAX Take 17 g by mouth daily as needed for mild constipation.   potassium chloride 8 MEQ  tablet Commonly known as: KLOR-CON Take 1 tablet (8 mEq total) by mouth every morning.   pyridostigmine 60 MG tablet Commonly known as: MESTINON Take 1 tablet (60 mg total) by mouth 3 (three) times daily.   rivaroxaban 10 MG Tabs tablet Commonly known as: Xarelto Take 1 tablet (10 mg total) by mouth daily. For 30 days to prevent blood clots after surgery   senna-docusate 8.6-50 MG tablet Commonly known as: Senokot-S Take 1 tablet by mouth 2 (two) times daily.   traMADol 50 MG tablet Commonly known as: Ultram Take 1 tablet (50 mg total) by mouth every 12 (twelve) hours as needed for severe pain.   Vitamin D 125 MCG (5000 UT) Caps Take 5,000 Units by mouth daily.                Discharge Care Instructions  (From admission, onward)           Start     Ordered   08/04/21 0000  Leave dressing on - Keep it clean, dry, and intact until clinic visit        08/04/21 0757            Follow-up Information     Sheral Apley, MD. Schedule an appointment as soon as possible for a visit in 2 week(s).   Specialty: Orthopedic Surgery Contact information: [address] [place] [address] [phone]         Plotnikov, Georgina Quint, MD. Schedule an appointment as soon as possible for a visit .   Specialty: Internal Medicine Why: As needed Contact information: [address] [place] [address] [phone]         Jobe Gibbon, [facility] .   Specialty: Home Health Services Why: [facility] physical therapy/occupational therapy/social worker Contact information: [address] [place] [address] [place] [address] [phone]                Disposition: Home PT recommended SNF. Diet recommendation:  Discharge Diet Orders (From admission, onward)     Start     Ordered   08/04/21 0000  Diet - low sodium heart healthy        08/04/21 0757           Discharge Exam: Filed Weights   08/04/21 1356  Weight: 98.9 kg   General: Appear in mild distress; no visible Abnormal Neck Mass Or lumps, Conjunctiva normal Cardiovascular: S1 and S2 Present, no Murmur, Respiratory: good respiratory effort, Bilateral Air entry present and CTA, no Crackles, no wheezes Abdomen: Bowel Sound present nontender Extremities: Trace pedal edema Neurology: alert and not oriented to time, place, and person Gait not checked due to patient safety concerns   Condition at discharge: good  The results of significant diagnostics from this hospitalization (including imaging, microbiology, ancillary and laboratory) are listed below for reference.   Imaging Studies: DG Elbow Complete Right  Result Date: 07/30/2021 CLINICAL DATA:   Fall EXAM: RIGHT ELBOW - COMPLETE 3+ VIEW COMPARISON:  None. FINDINGS: There is no evidence of fracture, dislocation, or joint effusion. There is no evidence of arthropathy or other focal bone abnormality. Soft tissues are unremarkable. IMPRESSION: Negative. Electronically Signed   By: Marlan Palau M.D.   On: 07/30/2021 12:57   DG Knee 1-2 Views Right  Result Date: 07/31/2021 CLINICAL DATA:  Proximal RIGHT tibial fracture EXAM: RIGHT KNEE - 1-2 VIEW COMPARISON:  07/30/2021 FLUOROSCOPY TIME:  0 minutes 38 seconds Dose: 2.63 mGy  Images: 2 FINDINGS: Lateral plate and multiple screws identified at proximal tibia post ORIF. Marked osseous demineralization. Old plate and screws at distal femur. Nondisplaced fracture at the RIGHT fibular neck noted. IMPRESSION: Post ORIF of proximal RIGHT tibial metaphyseal fracture. Nondisplaced fracture RIGHT fibular neck. Electronically Signed   By: Ulyses Southward M.D.   On: 07/31/2021 15:41   DG Tibia/Fibula Right  Result Date: 07/30/2021 CLINICAL DATA:  Fall EXAM: RIGHT TIBIA AND FIBULA - 2 VIEW COMPARISON:  07/30/2021 FINDINGS: Nondisplaced fracture proximal fibula. Nondisplaced fracture proximal lateral tibia. Degenerative change in the knee. No other fracture in the tibia and fibula. IMPRESSION: Nondisplaced fractures proximal tibia and fibula. Electronically Signed   By: Marlan Palau M.D.   On: 07/30/2021 11:42   CT KNEE RIGHT WO CONTRAST  Result Date: 07/31/2021 CLINICAL DATA:  Right knee pain. EXAM: CT OF THE RIGHT KNEE WITHOUT CONTRAST TECHNIQUE: Multidetector CT imaging of the right knee was performed according to the standard protocol. Multiplanar CT image reconstructions were also generated. RADIATION DOSE REDUCTION: This exam was performed according to the departmental dose-optimization program which includes automated exposure control, adjustment of the mA and/or kV according to patient size and/or use of iterative reconstruction technique. COMPARISON:  None.  FINDINGS: Bones/Joint/Cartilage Healed distal femoral diaphyseal fracture transfixed with a lateral sideplate and multiple interlocking screws. Transverse minimally displaced fracture of the proximal tibial metaphysis extending to the proximal tibia-fibular joint. Nondisplaced fracture of the proximal fibular metaphysis. 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 Moderate osteoarthritis of the patellofemoral compartment. Normal alignment. No joint effusion. Generalized osteopenia.  No aggressive osseous lesion. Ligaments Ligaments are suboptimally evaluated by CT. Muscles and Tendons Muscles are normal. No muscle atrophy. No intramuscular fluid collection or hematoma. Soft tissue No fluid collection or hematoma.  No soft tissue mass. IMPRESSION: 1. Acute transverse minimally displaced fracture of the proximal tibial metaphysis extending to the proximal tibia-fibular joint. 2. Acute nondisplaced fracture of the proximal fibular metaphysis. 3. Healed distal femoral diaphyseal fracture transfixed with a lateral sideplate and multiple interlocking screws. 4. 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 Moderate osteoarthritis of the patellofemoral compartment. Electronically Signed   By: Elige Ko M.D.   On: 07/31/2021 11:19   DG CHEST PORT 1 VIEW  Result Date: 08/01/2021 CLINICAL DATA:  Recent tib fib surgery. Feels hot with chest tightness and malaise. EXAM: PORTABLE CHEST 1 VIEW COMPARISON:  None. FINDINGS: The heart size and mediastinal contours are within normal limits. Both lungs are clear. The visualized skeletal structures are unremarkable. IMPRESSION: No active disease. Electronically Signed   By: Burman Nieves M.D.   On: 08/01/2021 20:37   DG Knee Complete 4 Views Right  Result Date: 07/30/2021 CLINICAL DATA:  Fall EXAM: RIGHT KNEE - COMPLETE 4+ VIEW COMPARISON:  07/30/2021 FINDINGS: Fracture of the proximal lateral tibia likely  extending into the joint. No significant depression. Nondisplaced fracture proximal fibula. Lateral plate and screw fixation of the distal femur. Hardware in satisfactory position. Chronic healed fracture distal femur. Advanced degenerative change in the knee with joint space narrowing and spurring in all 3 compartments. IMPRESSION: Acute fracture of the lateral tibial plateau without depression of the tibial plateau. Nondisplaced fracture proximal fibula. CT of the knee may be helpful for further evaluation. Advanced degenerative change in the knee. Electronically Signed   By: Marlan Palau M.D.   On: 07/30/2021 11:41   DG Tibia/Fibula Right Port  Result Date: 08/01/2021 CLINICAL DATA:  Right knee ORIF  EXAM: PORTABLE RIGHT TIBIA AND FIBULA - 2 VIEW COMPARISON:  07/31/2021 FINDINGS: Interval postsurgical changes from proximal right tibia ORIF with lateral sideplate and screw fixation construct. Fracture alignment is improved, near anatomic. Nondisplaced proximal fibular fracture. Partially visualized prior distal femoral ORIF hardware. Diffuse osseous demineralization. No new fractures. No malalignment. Expected postsurgical changes within the soft tissues. IMPRESSION: 1. Interval postsurgical changes from proximal right tibia ORIF without evidence of postoperative complication. 2. Unchanged nondisplaced proximal fibular fracture. Electronically Signed   By: Duanne Guess D.O.   On: 08/01/2021 14:47   DG Humerus Right  Result Date: 07/30/2021 CLINICAL DATA:  Fall EXAM: RIGHT HUMERUS - 2+ VIEW COMPARISON:  None. FINDINGS: Is no acute fracture or dislocation. Shoulder and elbow alignment appear maintained. The soft tissues are unremarkable. IMPRESSION: No acute fracture or dislocation. Electronically Signed   By: Lesia Hausen M.D.   On: 07/30/2021 12:59   DG Hand Complete Right  Result Date: 07/30/2021 CLINICAL DATA:  Fall EXAM: RIGHT HAND - COMPLETE 3+ VIEW COMPARISON:  None. FINDINGS: Negative for  fracture or dislocation.  Mild osteoarthritis. IMPRESSION: Negative for fracture. Electronically Signed   By: Marlan Palau M.D.   On: 07/30/2021 12:54   DG C-Arm 1-60 Min-No Report  Result Date: 07/31/2021 Fluoroscopy was utilized by the requesting physician.  No radiographic interpretation.   DG C-Arm 1-60 Min-No Report  Result Date: 07/31/2021 Fluoroscopy was utilized by the requesting physician.  No radiographic interpretation.   DG Hip Unilat W or Wo Pelvis 2-3 Views Right  Result Date: 07/30/2021 CLINICAL DATA:  Fall EXAM: DG HIP (WITH OR WITHOUT PELVIS) 2-3V RIGHT COMPARISON:  02/06/2021 FINDINGS: Right hip hemiarthroplasty in satisfactory position and alignment. Negative for pelvic fracture.  Left hip joint normal. IMPRESSION: No acute abnormality. Right hip hemiarthroplasty without complication. Electronically Signed   By: Marlan Palau M.D.   On: 07/30/2021 11:37   DG Femur Min 2 Views Right  Result Date: 07/30/2021 CLINICAL DATA:  Fall EXAM: RIGHT FEMUR 2 VIEWS COMPARISON:  None. FINDINGS: Right hip hemiarthroplasty in satisfactory position and alignment Chronic healed fracture distal femur with lateral plate and screw fixation Advanced degenerative change in the knee Nondisplaced fractures proximal fibula and tibia. IMPRESSION: No acute fracture of the femur Nondisplaced fractures proximal right tibia and fibula. Electronically Signed   By: Marlan Palau M.D.   On: 07/30/2021 11:43    Microbiology: Results for orders placed or performed during the hospital encounter of 07/30/21  Resp Panel by RT-PCR (Flu A&B, Covid) Nasopharyngeal Swab     Status: None   Collection Time: 07/30/21  4:57 PM   Specimen: Nasopharyngeal Swab; Nasopharyngeal(NP) swabs in vial transport medium  Result Value Ref Range Status   SARS Coronavirus 2 by RT PCR NEGATIVE NEGATIVE Final    Comment: (NOTE) SARS-CoV-2 target nucleic acids are NOT DETECTED.  The SARS-CoV-2 RNA is generally detectable in upper  respiratory specimens during the acute phase of infection. The lowest concentration of SARS-CoV-2 viral copies this assay can detect is 138 copies/mL. A negative result does not preclude SARS-Cov-2 infection and should not be used as the sole basis for treatment or other patient management decisions. A negative result may occur with  improper specimen collection/handling, submission of specimen other than nasopharyngeal swab, presence of viral mutation(s) within the areas targeted by this assay, and inadequate number of viral copies(<138 copies/mL). A negative result must be combined with clinical observations, patient history, and epidemiological information. The expected result is Negative.  Fact  Sheet for Patients:  BloggerCourse.com  Fact Sheet for Healthcare Providers:  SeriousBroker.it  This test is no t yet approved or cleared by the [place] FDA and  has been authorized for detection and/or diagnosis of SARS-CoV-2 by FDA under an Emergency Use Authorization (EUA). This EUA will remain  in effect (meaning this test can be used) for the duration of the COVID-19 declaration under Section 564(b)(1) of the Act, 21 U.S.C.section 360bbb-3(b)(1), unless the authorization is terminated  or revoked sooner.       Influenza A by PCR NEGATIVE NEGATIVE Final   Influenza B by PCR NEGATIVE NEGATIVE Final    Comment: (NOTE) The Xpert Xpress SARS-CoV-2/FLU/RSV plus assay is intended as an aid in the diagnosis of influenza from Nasopharyngeal swab specimens and should not be used as a sole basis for treatment. Nasal washings and aspirates are unacceptable for Xpert Xpress SARS-CoV-2/FLU/RSV testing.  Fact Sheet for Patients: BloggerCourse.com  Fact Sheet for Healthcare Providers: SeriousBroker.it  This test is not yet approved or cleared by the [place] FDA and has been  authorized for detection and/or diagnosis of SARS-CoV-2 by FDA under an Emergency Use Authorization (EUA). This EUA will remain in effect (meaning this test can be used) for the duration of the COVID-19 declaration under Section 564(b)(1) of the Act, 21 U.S.C. section 360bbb-3(b)(1), unless the authorization is terminated or revoked.  Performed at [facility], [address] [place], [place] [address]   Urine Culture     Status: Abnormal   Collection Time: 07/31/21  1:24 AM   Specimen: In/Out Cath Urine  Result Value Ref Range Status   Specimen Description   Final    IN/OUT CATH URINE Performed at [facility], [address] [place], [place] [address]    Special Requests   Final    NONE Performed at [facility], [address] [place], [place] [address]    Culture (A)  Final    >=100,000 COLONIES/mL ESCHERICHIA COLI >=100,000 COLONIES/mL KLEBSIELLA PNEUMONIAE    Report Status 08/03/2021 FINAL  Final   Organism ID, Bacteria ESCHERICHIA COLI (A)  Final   Organism ID, Bacteria KLEBSIELLA PNEUMONIAE (A)  Final      Susceptibility   Escherichia coli - MIC*    AMPICILLIN >=32 RESISTANT Resistant     CEFAZOLIN <=4 SENSITIVE Sensitive     CEFEPIME <=0.12 SENSITIVE Sensitive     CEFTRIAXONE <=0.25 SENSITIVE Sensitive     CIPROFLOXACIN <=0.25 SENSITIVE Sensitive     GENTAMICIN <=1 SENSITIVE Sensitive     IMIPENEM <=0.25 SENSITIVE Sensitive     NITROFURANTOIN <=16 SENSITIVE Sensitive     TRIMETH/SULFA <=20 SENSITIVE Sensitive     AMPICILLIN/SULBACTAM >=32 RESISTANT Resistant     PIP/TAZO <=4 SENSITIVE Sensitive     * >=100,000 COLONIES/mL ESCHERICHIA COLI   Klebsiella pneumoniae - MIC*    AMPICILLIN >=32 RESISTANT Resistant     CEFAZOLIN <=4 SENSITIVE Sensitive     CEFEPIME <=0.12 SENSITIVE Sensitive     CEFTRIAXONE <=0.25 SENSITIVE Sensitive     CIPROFLOXACIN <=0.25 SENSITIVE Sensitive     GENTAMICIN <=1 SENSITIVE  Sensitive     IMIPENEM <=0.25 SENSITIVE Sensitive     NITROFURANTOIN 64 INTERMEDIATE Intermediate     TRIMETH/SULFA <=20 SENSITIVE Sensitive     AMPICILLIN/SULBACTAM >=32 RESISTANT Resistant     PIP/TAZO <=4 SENSITIVE Sensitive     * >=100,000 COLONIES/mL KLEBSIELLA PNEUMONIAE    Labs: CBC: Recent Labs  Lab 07/31/21 1639  08/01/21 1440 08/02/21 0444 08/03/21 0355 08/04/21 0423  WBC 8.7 8.4 6.9 4.9 4.6  NEUTROABS 7.1 6.5 4.2 2.3 2.1  HGB 12.3 11.5* 11.4* 10.8* 11.1*  HCT 40.0 35.3* 36.5 33.8* 35.2*  MCV 92.8 90.5 91.3 91.4 91.4  PLT 130* 138* 132* 153 150   Basic Metabolic Panel: Recent Labs  Lab 07/31/21 1639 08/01/21 1440 08/02/21 0444 08/03/21 0355 08/04/21 0423  NA 135 135 139 138 139  K 3.8 3.9 3.5 3.6 3.7  CL 98 102 101 100 100  CO2 28 28 30 30 31   GLUCOSE 109* 132* 82 83 97  BUN 12 16 17 13 12   CREATININE 0.82 0.68 0.71 0.64 0.50  CALCIUM 8.6* 8.2* 8.9 8.5* 8.6*  MG  --   --  2.2 2.0 2.1   Liver Function Tests: Recent Labs  Lab 07/31/21 1639 08/01/21 1440  AST 15 21  ALT 11 9  ALKPHOS 51 42  BILITOT 0.9 1.5*  PROT 6.3* 5.7*  ALBUMIN 3.3* 2.8*   CBG: No results for input(s): GLUCAP in the last 168 hours.  Discharge time spent: greater than 30 minutes.  Signed: Lynden Oxford, MD [facility]
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
## 2021-08-05 NOTE — Progress Notes (Signed)
Patient discharged home via PTAR,patient alert, given scheduled evening meds and  IV site Dc'd and WDL, VSS. Patient daughter [name] notified. ?

## 2021-08-07 DIAGNOSIS — Z7982 Long term (current) use of aspirin: Secondary | ICD-10-CM | POA: Diagnosis not present

## 2021-08-07 DIAGNOSIS — F0280 Dementia in other diseases classified elsewhere without behavioral disturbance: Secondary | ICD-10-CM | POA: Diagnosis not present

## 2021-08-07 DIAGNOSIS — Z7952 Long term (current) use of systemic steroids: Secondary | ICD-10-CM | POA: Diagnosis not present

## 2021-08-07 DIAGNOSIS — I951 Orthostatic hypotension: Secondary | ICD-10-CM | POA: Diagnosis not present

## 2021-08-07 DIAGNOSIS — Z9181 History of falling: Secondary | ICD-10-CM | POA: Diagnosis not present

## 2021-08-07 DIAGNOSIS — G309 Alzheimer's disease, unspecified: Secondary | ICD-10-CM | POA: Diagnosis not present

## 2021-08-07 DIAGNOSIS — Z7401 Bed confinement status: Secondary | ICD-10-CM | POA: Diagnosis not present

## 2021-08-07 DIAGNOSIS — R5381 Other malaise: Secondary | ICD-10-CM | POA: Diagnosis not present

## 2021-08-10 DIAGNOSIS — Z7982 Long term (current) use of aspirin: Secondary | ICD-10-CM | POA: Diagnosis not present

## 2021-08-10 DIAGNOSIS — I951 Orthostatic hypotension: Secondary | ICD-10-CM | POA: Diagnosis not present

## 2021-08-10 DIAGNOSIS — Z7952 Long term (current) use of systemic steroids: Secondary | ICD-10-CM | POA: Diagnosis not present

## 2021-08-10 DIAGNOSIS — G309 Alzheimer's disease, unspecified: Secondary | ICD-10-CM | POA: Diagnosis not present

## 2021-08-10 DIAGNOSIS — R5381 Other malaise: Secondary | ICD-10-CM | POA: Diagnosis not present

## 2021-08-10 DIAGNOSIS — F0280 Dementia in other diseases classified elsewhere without behavioral disturbance: Secondary | ICD-10-CM | POA: Diagnosis not present

## 2021-08-14 ENCOUNTER — Telehealth (INDEPENDENT_AMBULATORY_CARE_PROVIDER_SITE_OTHER): Payer: Medicare Other | Admitting: Neurology

## 2021-08-14 ENCOUNTER — Other Ambulatory Visit: Payer: Self-pay

## 2021-08-14 ENCOUNTER — Encounter: Payer: Self-pay | Admitting: Neurology

## 2021-08-14 VITALS — Wt 220.0 lb

## 2021-08-14 DIAGNOSIS — F0280 Dementia in other diseases classified elsewhere without behavioral disturbance: Secondary | ICD-10-CM | POA: Diagnosis not present

## 2021-08-14 DIAGNOSIS — I951 Orthostatic hypotension: Secondary | ICD-10-CM | POA: Diagnosis not present

## 2021-08-14 DIAGNOSIS — G309 Alzheimer's disease, unspecified: Secondary | ICD-10-CM | POA: Diagnosis not present
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
## 2021-08-15 DIAGNOSIS — F0280 Dementia in other diseases classified elsewhere without behavioral disturbance: Secondary | ICD-10-CM | POA: Diagnosis not present

## 2021-08-15 DIAGNOSIS — G309 Alzheimer's disease, unspecified: Secondary | ICD-10-CM | POA: Diagnosis not present

## 2021-08-15 DIAGNOSIS — R5381 Other malaise: Secondary | ICD-10-CM | POA: Diagnosis not present

## 2021-08-15 DIAGNOSIS — Z7982 Long term (current) use of aspirin: Secondary | ICD-10-CM | POA: Diagnosis not present

## 2021-08-15 DIAGNOSIS — Z7952 Long term (current) use of systemic steroids: Secondary | ICD-10-CM | POA: Diagnosis not present

## 2021-08-15 DIAGNOSIS — I951 Orthostatic hypotension: Secondary | ICD-10-CM | POA: Diagnosis not present

## 2021-08-17 DIAGNOSIS — S82141A Displaced bicondylar fracture of right tibia, initial encounter for closed fracture: Secondary | ICD-10-CM | POA: Diagnosis not present

## 2021-08-21 DIAGNOSIS — I951 Orthostatic hypotension: Secondary | ICD-10-CM | POA: Diagnosis not present

## 2021-08-21 DIAGNOSIS — Z7952 Long term (current) use of systemic steroids: Secondary | ICD-10-CM | POA: Diagnosis not present

## 2021-08-21 DIAGNOSIS — Z7982 Long term (current) use of aspirin: Secondary | ICD-10-CM | POA: Diagnosis not present

## 2021-08-21 DIAGNOSIS — R5381 Other malaise: Secondary | ICD-10-CM | POA: Diagnosis not present

## 2021-08-21 DIAGNOSIS — F0280 Dementia in other diseases classified elsewhere without behavioral disturbance: Secondary | ICD-10-CM | POA: Diagnosis not present

## 2021-08-21 DIAGNOSIS — G309 Alzheimer's disease, unspecified: Secondary | ICD-10-CM | POA: Diagnosis not present

## 2021-08-29 DIAGNOSIS — G309 Alzheimer's disease, unspecified: Secondary | ICD-10-CM | POA: Diagnosis not present

## 2021-08-29 DIAGNOSIS — F0280 Dementia in other diseases classified elsewhere without behavioral disturbance: Secondary | ICD-10-CM | POA: Diagnosis not present

## 2021-08-29 DIAGNOSIS — R5381 Other malaise: Secondary | ICD-10-CM | POA: Diagnosis not present

## 2021-08-29 DIAGNOSIS — Z7952 Long term (current) use of systemic steroids: Secondary | ICD-10-CM | POA: Diagnosis not present

## 2021-08-29 DIAGNOSIS — Z7982 Long term (current) use of aspirin: Secondary | ICD-10-CM | POA: Diagnosis not present

## 2021-08-29 DIAGNOSIS — I951 Orthostatic hypotension: Secondary | ICD-10-CM | POA: Diagnosis not present

## 2021-09-05 DIAGNOSIS — F0280 Dementia in other diseases classified elsewhere without behavioral disturbance: Secondary | ICD-10-CM | POA: Diagnosis not present

## 2021-09-05 DIAGNOSIS — Z7952 Long term (current) use of systemic steroids: Secondary | ICD-10-CM | POA: Diagnosis not present

## 2021-09-05 DIAGNOSIS — I951 Orthostatic hypotension: Secondary | ICD-10-CM | POA: Diagnosis not present

## 2021-09-05 DIAGNOSIS — Z7982 Long term (current) use of aspirin: Secondary | ICD-10-CM | POA: Diagnosis not present

## 2021-09-05 DIAGNOSIS — R5381 Other malaise: Secondary | ICD-10-CM | POA: Diagnosis not present

## 2021-09-05 DIAGNOSIS — G309 Alzheimer's disease, unspecified: Secondary | ICD-10-CM | POA: Diagnosis not present

## 2021-09-06 DIAGNOSIS — G309 Alzheimer's disease, unspecified: Secondary | ICD-10-CM | POA: Diagnosis not present

## 2021-09-06 DIAGNOSIS — R5381 Other malaise: Secondary | ICD-10-CM | POA: Diagnosis not present

## 2021-09-06 DIAGNOSIS — Z7982 Long term (current) use of aspirin: Secondary | ICD-10-CM | POA: Diagnosis not present

## 2021-09-06 DIAGNOSIS — Z9181 History of falling: Secondary | ICD-10-CM | POA: Diagnosis not present

## 2021-09-06 DIAGNOSIS — F0280 Dementia in other diseases classified elsewhere without behavioral disturbance: Secondary | ICD-10-CM | POA: Diagnosis not present

## 2021-09-06 DIAGNOSIS — Z7401 Bed confinement status: Secondary | ICD-10-CM | POA: Diagnosis not present

## 2021-09-06 DIAGNOSIS — Z7952 Long term (current) use of systemic steroids: Secondary | ICD-10-CM | POA: Diagnosis not present

## 2021-09-06 DIAGNOSIS — I951 Orthostatic hypotension: Secondary | ICD-10-CM | POA: Diagnosis not present

## 2021-09-07 DIAGNOSIS — Z9181 History of falling: Secondary | ICD-10-CM | POA: Diagnosis not present

## 2021-09-07 DIAGNOSIS — Z7401 Bed confinement status: Secondary | ICD-10-CM | POA: Diagnosis not present

## 2021-09-07 DIAGNOSIS — Z7952 Long term (current) use of systemic steroids: Secondary | ICD-10-CM | POA: Diagnosis not present

## 2021-09-07 DIAGNOSIS — R5381 Other malaise: Secondary | ICD-10-CM | POA: Diagnosis not present

## 2021-09-07 DIAGNOSIS — G309 Alzheimer's disease, unspecified: Secondary | ICD-10-CM | POA: Diagnosis not present

## 2021-09-07 DIAGNOSIS — I951 Orthostatic hypotension: Secondary | ICD-10-CM | POA: Diagnosis not present

## 2021-09-07 DIAGNOSIS — Z7982 Long term (current) use of aspirin: Secondary | ICD-10-CM | POA: Diagnosis not present

## 2021-09-07 DIAGNOSIS — F0280 Dementia in other diseases classified elsewhere without behavioral disturbance: Secondary | ICD-10-CM | POA: Diagnosis not present

## 2021-09-11 ENCOUNTER — Encounter: Payer: Self-pay | Admitting: Internal Medicine

## 2021-09-11 ENCOUNTER — Telehealth (INDEPENDENT_AMBULATORY_CARE_PROVIDER_SITE_OTHER): Payer: Medicare Other | Admitting: Internal Medicine

## 2021-09-11 DIAGNOSIS — G309 Alzheimer's disease, unspecified: Secondary | ICD-10-CM

## 2021-09-11 DIAGNOSIS — Y92009 Unspecified place in unspecified non-institutional (private) residence as the place of occurrence of the external cause: Secondary | ICD-10-CM

## 2021-09-11 DIAGNOSIS — Z7982 Long term (current) use of aspirin: Secondary | ICD-10-CM | POA: Diagnosis not present

## 2021-09-11 DIAGNOSIS — I951 Orthostatic hypotension: Secondary | ICD-10-CM | POA: Diagnosis not present

## 2021-09-11 DIAGNOSIS — R5381 Other malaise: Secondary | ICD-10-CM | POA: Diagnosis not present

## 2021-09-11 DIAGNOSIS — Z7952 Long term (current) use of systemic steroids: Secondary | ICD-10-CM | POA: Diagnosis not present

## 2021-09-11 DIAGNOSIS — N390 Urinary tract infection, site not specified: Secondary | ICD-10-CM

## 2021-09-11 DIAGNOSIS — W19XXXD Unspecified fall, subsequent encounter: Secondary | ICD-10-CM | POA: Diagnosis not present

## 2021-09-11 DIAGNOSIS — F0280 Dementia in other diseases classified elsewhere without behavioral disturbance: Secondary | ICD-10-CM | POA: Diagnosis not present

## 2021-09-11 MED ORDER — AMOXICILLIN 500 MG PO CAPS: 500.0000 mg | ORAL_CAPSULE | Freq: Three times a day (TID) | ORAL | 1 refills | Status: AC

## 2021-09-11 NOTE — Assessment & Plan Note (Signed)
No recent falls. Bed ridden  ?

## 2021-09-11 NOTE — Assessment & Plan Note (Signed)
Progressing dementia. Discussed w/dtr ?

## 2021-09-11 NOTE — Progress Notes (Signed)
Virtual Visit via Video Note ? ?I connected with Sabrina Page on 09/11/21 at  8:30 AM EDT by a video enabled telemedicine application and verified that I am speaking with the correct person using two identifiers. ?  ?I discussed the limitations of evaluation and management by telemedicine and the availability of in person appointments. The patient expressed understanding and agreed to proceed. ? ?I was located at our [facility] office. ?The patient was at home. ?Dtr [name] present in the visit. ? ?No chief complaint on file. ?  ? ?History of Present Illness: ?C/o recurrent UTI, dementia ?Recent hospital stay in March - reviewed notes, tests, d/c ? ?Review of Systems  ?Constitutional:  Negative for chills, fever and weight loss.  ?Cardiovascular:  Negative for leg swelling.  ?Gastrointestinal:  Negative for vomiting.  ?Musculoskeletal:  Positive for back pain and falls. Negative for joint pain.  ?Incontinent ? ?Observations/Objective: ?The patient appears to be in no acute distress. In bed. ? ?Assessment and Plan: ? ?Problem List Items Addressed This Visit   ? ? Fall at home  ?  No recent falls. Bed ridden  ? ?  ?  ? UTI (urinary tract infection)  ?  Discussed w/dtr [name] ?Ampicillin x 10 d prn ? ? ?  ?  ? 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  Progressing dementia. Discussed w/dtr ? ?  ?  ? ? ? ?Meds ordered this encounter  ?Medications  ? amoxicillin (AMOXIL) 500 MG capsule  ?  Sig: Take 1 capsule (500 mg total) by mouth 3 (three) times daily for 20 days. Use prn UTI  ?  Dispense:  30 capsule  ?  Refill:  1  ?  ? ?Follow Up Instructions: ? ?  ?I discussed the assessment and treatment plan with the patient. The patient was provided an opportunity to ask questions and all were answered. The patient agreed with the plan and demonstrated an understanding of the instructions. ?  ?The patient was advised to call back or seek an in-person evaluation if the symptoms worsen or if the condition fails to  improve as anticipated. ? ?I provided face-to-face time during this encounter. We were at different locations. ? ? ?[name], MD ? ?

## 2021-09-11 NOTE — Assessment & Plan Note (Signed)
Discussed w/dtr [name] ?Ampicillin x 10 d prn ? ?

## 2021-09-14 DIAGNOSIS — S82141D Displaced bicondylar fracture of right tibia, subsequent encounter for closed fracture with routine healing: Secondary | ICD-10-CM | POA: Diagnosis not present

## 2021-09-15 ENCOUNTER — Ambulatory Visit: Payer: Medicare Other | Admitting: Internal Medicine

## 2021-09-19 DIAGNOSIS — G309 Alzheimer's disease, unspecified: Secondary | ICD-10-CM | POA: Diagnosis not present

## 2021-09-19 DIAGNOSIS — F0280 Dementia in other diseases classified elsewhere without behavioral disturbance: Secondary | ICD-10-CM | POA: Diagnosis not present

## 2021-09-19 DIAGNOSIS — R5381 Other malaise: Secondary | ICD-10-CM | POA: Diagnosis not present

## 2021-09-19 DIAGNOSIS — I951 Orthostatic hypotension: Secondary | ICD-10-CM | POA: Diagnosis not present

## 2021-09-19 DIAGNOSIS — Z7982 Long term (current) use of aspirin: Secondary | ICD-10-CM | POA: Diagnosis not present

## 2021-09-19 DIAGNOSIS — Z7952 Long term (current) use of systemic steroids: Secondary | ICD-10-CM | POA: Diagnosis not present

## 2021-09-26 DIAGNOSIS — G309 Alzheimer's disease, unspecified: Secondary | ICD-10-CM | POA: Diagnosis not present

## 2021-09-26 DIAGNOSIS — F0280 Dementia in other diseases classified elsewhere without behavioral disturbance: Secondary | ICD-10-CM | POA: Diagnosis not present

## 2021-09-26 DIAGNOSIS — Z7982 Long term (current) use of aspirin: Secondary | ICD-10-CM | POA: Diagnosis not present

## 2021-09-26 DIAGNOSIS — I951 Orthostatic hypotension: Secondary | ICD-10-CM | POA: Diagnosis not present

## 2021-09-26 DIAGNOSIS — Z7952 Long term (current) use of systemic steroids: Secondary | ICD-10-CM | POA: Diagnosis not present

## 2021-09-26 DIAGNOSIS — R5381 Other malaise: Secondary | ICD-10-CM | POA: Diagnosis not present

## 2021-09-28 ENCOUNTER — Other Ambulatory Visit: Payer: Self-pay | Admitting: Internal Medicine

## 2021-10-03 DIAGNOSIS — F0280 Dementia in other diseases classified elsewhere without behavioral disturbance: Secondary | ICD-10-CM | POA: Diagnosis not present

## 2021-10-03 DIAGNOSIS — G309 Alzheimer's disease, unspecified: Secondary | ICD-10-CM | POA: Diagnosis not present

## 2021-10-03 DIAGNOSIS — Z7952 Long term (current) use of systemic steroids: Secondary | ICD-10-CM | POA: Diagnosis not present

## 2021-10-03 DIAGNOSIS — I951 Orthostatic hypotension: Secondary | ICD-10-CM | POA: Diagnosis not present

## 2021-10-03 DIAGNOSIS — R5381 Other malaise: Secondary | ICD-10-CM | POA: Diagnosis not present

## 2021-10-03 DIAGNOSIS — Z7982 Long term (current) use of aspirin: Secondary | ICD-10-CM | POA: Diagnosis not present

## 2021-10-06 DIAGNOSIS — Z7401 Bed confinement status: Secondary | ICD-10-CM | POA: Diagnosis not present

## 2021-10-06 DIAGNOSIS — Z9181 History of falling: Secondary | ICD-10-CM | POA: Diagnosis not present

## 2021-10-06 DIAGNOSIS — F0280 Dementia in other diseases classified elsewhere without behavioral disturbance: Secondary | ICD-10-CM | POA: Diagnosis not present

## 2021-10-06 DIAGNOSIS — Z7952 Long term (current) use of systemic steroids: Secondary | ICD-10-CM | POA: Diagnosis not present

## 2021-10-06 DIAGNOSIS — Z7982 Long term (current) use of aspirin: Secondary | ICD-10-CM | POA: Diagnosis not present

## 2021-10-06 DIAGNOSIS — R5381 Other malaise: Secondary | ICD-10-CM | POA: Diagnosis not present

## 2021-10-06 DIAGNOSIS — G309 Alzheimer's disease, unspecified: Secondary | ICD-10-CM | POA: Diagnosis not present

## 2021-10-06 DIAGNOSIS — I951 Orthostatic hypotension: Secondary | ICD-10-CM | POA: Diagnosis not present

## 2021-10-09 DIAGNOSIS — Z7952 Long term (current) use of systemic steroids: Secondary | ICD-10-CM | POA: Diagnosis not present

## 2021-10-09 DIAGNOSIS — I951 Orthostatic hypotension: Secondary | ICD-10-CM | POA: Diagnosis not present

## 2021-10-09 DIAGNOSIS — R5381 Other malaise: Secondary | ICD-10-CM | POA: Diagnosis not present

## 2021-10-09 DIAGNOSIS — F0280 Dementia in other diseases classified elsewhere without behavioral disturbance: Secondary | ICD-10-CM | POA: Diagnosis not present

## 2021-10-09 DIAGNOSIS — Z7982 Long term (current) use of aspirin: Secondary | ICD-10-CM | POA: Diagnosis not present

## 2021-10-09 DIAGNOSIS — G309 Alzheimer's disease, unspecified: Secondary | ICD-10-CM | POA: Diagnosis not present

## 2021-10-16 DIAGNOSIS — Z7982 Long term (current) use of aspirin: Secondary | ICD-10-CM | POA: Diagnosis not present

## 2021-10-16 DIAGNOSIS — Z7952 Long term (current) use of systemic steroids: Secondary | ICD-10-CM | POA: Diagnosis not present

## 2021-10-16 DIAGNOSIS — G309 Alzheimer's disease, unspecified: Secondary | ICD-10-CM | POA: Diagnosis not present

## 2021-10-16 DIAGNOSIS — F0280 Dementia in other diseases classified elsewhere without behavioral disturbance: Secondary | ICD-10-CM | POA: Diagnosis not present

## 2021-10-16 DIAGNOSIS — R5381 Other malaise: Secondary | ICD-10-CM | POA: Diagnosis not present

## 2021-10-16 DIAGNOSIS — I951 Orthostatic hypotension: Secondary | ICD-10-CM | POA: Diagnosis not present

## 2021-10-23 DIAGNOSIS — G309 Alzheimer's disease, unspecified: Secondary | ICD-10-CM | POA: Diagnosis not present

## 2021-10-23 DIAGNOSIS — I951 Orthostatic hypotension: Secondary | ICD-10-CM | POA: Diagnosis not present

## 2021-10-23 DIAGNOSIS — F0280 Dementia in other diseases classified elsewhere without behavioral disturbance: Secondary | ICD-10-CM | POA: Diagnosis not present

## 2021-10-23 DIAGNOSIS — Z7952 Long term (current) use of systemic steroids: Secondary | ICD-10-CM | POA: Diagnosis not present

## 2021-10-23 DIAGNOSIS — Z7982 Long term (current) use of aspirin: Secondary | ICD-10-CM | POA: Diagnosis not present

## 2021-10-23 DIAGNOSIS — R5381 Other malaise: Secondary | ICD-10-CM | POA: Diagnosis not present

## 2021-10-30 DIAGNOSIS — G309 Alzheimer's disease, unspecified: Secondary | ICD-10-CM | POA: Diagnosis not present

## 2021-10-30 DIAGNOSIS — I951 Orthostatic hypotension: Secondary | ICD-10-CM | POA: Diagnosis not present

## 2021-10-30 DIAGNOSIS — F0280 Dementia in other diseases classified elsewhere without behavioral disturbance: Secondary | ICD-10-CM | POA: Diagnosis not present

## 2021-10-30 DIAGNOSIS — Z7982 Long term (current) use of aspirin: Secondary | ICD-10-CM | POA: Diagnosis not present

## 2021-10-30 DIAGNOSIS — R5381 Other malaise: Secondary | ICD-10-CM | POA: Diagnosis not present

## 2021-10-30 DIAGNOSIS — Z7952 Long term (current) use of systemic steroids: Secondary | ICD-10-CM | POA: Diagnosis not present

## 2021-11-05 ENCOUNTER — Other Ambulatory Visit: Payer: Self-pay | Admitting: Internal Medicine

## 2021-11-05 DIAGNOSIS — I951 Orthostatic hypotension: Secondary | ICD-10-CM | POA: Diagnosis not present

## 2021-11-05 DIAGNOSIS — G309 Alzheimer's disease, unspecified: Secondary | ICD-10-CM | POA: Diagnosis not present

## 2021-11-05 DIAGNOSIS — Z7952 Long term (current) use of systemic steroids: Secondary | ICD-10-CM | POA: Diagnosis not present

## 2021-11-05 DIAGNOSIS — Z7401 Bed confinement status: Secondary | ICD-10-CM | POA: Diagnosis not present

## 2021-11-05 DIAGNOSIS — R5381 Other malaise: Secondary | ICD-10-CM | POA: Diagnosis not present

## 2021-11-05 DIAGNOSIS — F0280 Dementia in other diseases classified elsewhere without behavioral disturbance: Secondary | ICD-10-CM | POA: Diagnosis not present

## 2021-11-05 DIAGNOSIS — Z9181 History of falling: Secondary | ICD-10-CM | POA: Diagnosis not present

## 2021-11-05 DIAGNOSIS — Z7982 Long term (current) use of aspirin: Secondary | ICD-10-CM | POA: Diagnosis not present

## 2021-11-07 DIAGNOSIS — F0280 Dementia in other diseases classified elsewhere without behavioral disturbance: Secondary | ICD-10-CM | POA: Diagnosis not present

## 2021-11-07 DIAGNOSIS — G309 Alzheimer's disease, unspecified: Secondary | ICD-10-CM | POA: Diagnosis not present

## 2021-11-07 DIAGNOSIS — Z7982 Long term (current) use of aspirin: Secondary | ICD-10-CM | POA: Diagnosis not present

## 2021-11-07 DIAGNOSIS — I951 Orthostatic hypotension: Secondary | ICD-10-CM | POA: Diagnosis not present

## 2021-11-07 DIAGNOSIS — Z7952 Long term (current) use of systemic steroids: Secondary | ICD-10-CM | POA: Diagnosis not present

## 2021-11-07 DIAGNOSIS — R5381 Other malaise: Secondary | ICD-10-CM | POA: Diagnosis not present

## 2021-11-13 DIAGNOSIS — Z7952 Long term (current) use of systemic steroids: Secondary | ICD-10-CM | POA: Diagnosis not present

## 2021-11-13 DIAGNOSIS — F0280 Dementia in other diseases classified elsewhere without behavioral disturbance: Secondary | ICD-10-CM | POA: Diagnosis not present

## 2021-11-13 DIAGNOSIS — Z7982 Long term (current) use of aspirin: Secondary | ICD-10-CM | POA: Diagnosis not present

## 2021-11-13 DIAGNOSIS — I951 Orthostatic hypotension: Secondary | ICD-10-CM | POA: Diagnosis not present

## 2021-11-13 DIAGNOSIS — G309 Alzheimer's disease, unspecified: Secondary | ICD-10-CM | POA: Diagnosis not present

## 2021-11-13 DIAGNOSIS — R5381 Other malaise: Secondary | ICD-10-CM | POA: Diagnosis not present

## 2021-11-20 DIAGNOSIS — F0280 Dementia in other diseases classified elsewhere without behavioral disturbance: Secondary | ICD-10-CM | POA: Diagnosis not present

## 2021-11-20 DIAGNOSIS — I951 Orthostatic hypotension: Secondary | ICD-10-CM | POA: Diagnosis not present

## 2021-11-20 DIAGNOSIS — Z7952 Long term (current) use of systemic steroids: Secondary | ICD-10-CM | POA: Diagnosis not present

## 2021-11-20 DIAGNOSIS — Z7982 Long term (current) use of aspirin: Secondary | ICD-10-CM | POA: Diagnosis not present

## 2021-11-20 DIAGNOSIS — G309 Alzheimer's disease, unspecified: Secondary | ICD-10-CM | POA: Diagnosis not present

## 2021-11-20 DIAGNOSIS — R5381 Other malaise: Secondary | ICD-10-CM | POA: Diagnosis not present

## 2021-11-28 DIAGNOSIS — R5381 Other malaise: Secondary | ICD-10-CM | POA: Diagnosis not present

## 2021-11-28 DIAGNOSIS — I951 Orthostatic hypotension: Secondary | ICD-10-CM | POA: Diagnosis not present

## 2021-11-28 DIAGNOSIS — Z7952 Long term (current) use of systemic steroids: Secondary | ICD-10-CM | POA: Diagnosis not present

## 2021-11-28 DIAGNOSIS — Z7982 Long term (current) use of aspirin: Secondary | ICD-10-CM | POA: Diagnosis not present

## 2021-11-28 DIAGNOSIS — F0280 Dementia in other diseases classified elsewhere without behavioral disturbance: Secondary | ICD-10-CM | POA: Diagnosis not present

## 2021-11-28 DIAGNOSIS — G309 Alzheimer's disease, unspecified: Secondary | ICD-10-CM | POA: Diagnosis not present

## 2021-12-04 DIAGNOSIS — I951 Orthostatic hypotension: Secondary | ICD-10-CM | POA: Diagnosis not present

## 2021-12-04 DIAGNOSIS — G309 Alzheimer's disease, unspecified: Secondary | ICD-10-CM | POA: Diagnosis not present

## 2021-12-04 DIAGNOSIS — R5381 Other malaise: Secondary | ICD-10-CM | POA: Diagnosis not present

## 2021-12-04 DIAGNOSIS — F0280 Dementia in other diseases classified elsewhere without behavioral disturbance: Secondary | ICD-10-CM | POA: Diagnosis not present

## 2021-12-04 DIAGNOSIS — Z7982 Long term (current) use of aspirin: Secondary | ICD-10-CM | POA: Diagnosis not present

## 2021-12-04 DIAGNOSIS — Z7952 Long term (current) use of systemic steroids: Secondary | ICD-10-CM | POA: Diagnosis not present

## 2021-12-05 DIAGNOSIS — G309 Alzheimer's disease, unspecified: Secondary | ICD-10-CM | POA: Diagnosis not present

## 2021-12-05 DIAGNOSIS — I951 Orthostatic hypotension: Secondary | ICD-10-CM | POA: Diagnosis not present

## 2021-12-05 DIAGNOSIS — Z79891 Long term (current) use of opiate analgesic: Secondary | ICD-10-CM | POA: Diagnosis not present

## 2021-12-05 DIAGNOSIS — Z9181 History of falling: Secondary | ICD-10-CM | POA: Diagnosis not present

## 2021-12-05 DIAGNOSIS — Z7982 Long term (current) use of aspirin: Secondary | ICD-10-CM | POA: Diagnosis not present

## 2021-12-05 DIAGNOSIS — R5381 Other malaise: Secondary | ICD-10-CM | POA: Diagnosis not present

## 2021-12-05 DIAGNOSIS — F0280 Dementia in other diseases classified elsewhere without behavioral disturbance: Secondary | ICD-10-CM | POA: Diagnosis not present

## 2021-12-05 DIAGNOSIS — Z7952 Long term (current) use of systemic steroids: Secondary | ICD-10-CM | POA: Diagnosis not present

## 2021-12-12 DIAGNOSIS — R5381 Other malaise: Secondary | ICD-10-CM | POA: Diagnosis not present

## 2021-12-12 DIAGNOSIS — I951 Orthostatic hypotension: Secondary | ICD-10-CM | POA: Diagnosis not present

## 2021-12-12 DIAGNOSIS — Z7982 Long term (current) use of aspirin: Secondary | ICD-10-CM | POA: Diagnosis not present

## 2021-12-12 DIAGNOSIS — Z7952 Long term (current) use of systemic steroids: Secondary | ICD-10-CM | POA: Diagnosis not present

## 2021-12-12 DIAGNOSIS — F0280 Dementia in other diseases classified elsewhere without behavioral disturbance: Secondary | ICD-10-CM | POA: Diagnosis not present

## 2021-12-12 DIAGNOSIS — G309 Alzheimer's disease, unspecified: Secondary | ICD-10-CM | POA: Diagnosis not present

## 2021-12-18 DIAGNOSIS — Z7982 Long term (current) use of aspirin: Secondary | ICD-10-CM | POA: Diagnosis not present

## 2021-12-18 DIAGNOSIS — F0280 Dementia in other diseases classified elsewhere without behavioral disturbance: Secondary | ICD-10-CM | POA: Diagnosis not present

## 2021-12-18 DIAGNOSIS — I951 Orthostatic hypotension: Secondary | ICD-10-CM | POA: Diagnosis not present

## 2021-12-18 DIAGNOSIS — G309 Alzheimer's disease, unspecified: Secondary | ICD-10-CM | POA: Diagnosis not present

## 2021-12-18 DIAGNOSIS — R5381 Other malaise: Secondary | ICD-10-CM | POA: Diagnosis not present

## 2021-12-18 DIAGNOSIS — Z7952 Long term (current) use of systemic steroids: Secondary | ICD-10-CM | POA: Diagnosis not present

## 2021-12-26 DIAGNOSIS — G309 Alzheimer's disease, unspecified: Secondary | ICD-10-CM | POA: Diagnosis not present

## 2021-12-26 DIAGNOSIS — Z7952 Long term (current) use of systemic steroids: Secondary | ICD-10-CM | POA: Diagnosis not present

## 2021-12-26 DIAGNOSIS — I951 Orthostatic hypotension: Secondary | ICD-10-CM | POA: Diagnosis not present

## 2021-12-26 DIAGNOSIS — Z7982 Long term (current) use of aspirin: Secondary | ICD-10-CM | POA: Diagnosis not present

## 2021-12-26 DIAGNOSIS — R5381 Other malaise: Secondary | ICD-10-CM | POA: Diagnosis not present

## 2021-12-26 DIAGNOSIS — F0280 Dementia in other diseases classified elsewhere without behavioral disturbance: Secondary | ICD-10-CM | POA: Diagnosis not present

## 2022-01-02 DIAGNOSIS — Z7982 Long term (current) use of aspirin: Secondary | ICD-10-CM | POA: Diagnosis not present

## 2022-01-02 DIAGNOSIS — Z7952 Long term (current) use of systemic steroids: Secondary | ICD-10-CM | POA: Diagnosis not present

## 2022-01-02 DIAGNOSIS — F0280 Dementia in other diseases classified elsewhere without behavioral disturbance: Secondary | ICD-10-CM | POA: Diagnosis not present

## 2022-01-02 DIAGNOSIS — R5381 Other malaise: Secondary | ICD-10-CM | POA: Diagnosis not present

## 2022-01-02 DIAGNOSIS — I951 Orthostatic hypotension: Secondary | ICD-10-CM | POA: Diagnosis not present

## 2022-01-02 DIAGNOSIS — G309 Alzheimer's disease, unspecified: Secondary | ICD-10-CM | POA: Diagnosis not present

## 2022-01-04 DIAGNOSIS — Z7952 Long term (current) use of systemic steroids: Secondary | ICD-10-CM | POA: Diagnosis not present

## 2022-01-04 DIAGNOSIS — Z7982 Long term (current) use of aspirin: Secondary | ICD-10-CM | POA: Diagnosis not present

## 2022-01-04 DIAGNOSIS — F0280 Dementia in other diseases classified elsewhere without behavioral disturbance: Secondary | ICD-10-CM | POA: Diagnosis not present

## 2022-01-04 DIAGNOSIS — G309 Alzheimer's disease, unspecified: Secondary | ICD-10-CM | POA: Diagnosis not present

## 2022-01-04 DIAGNOSIS — Z79891 Long term (current) use of opiate analgesic: Secondary | ICD-10-CM | POA: Diagnosis not present

## 2022-01-04 DIAGNOSIS — R5381 Other malaise: Secondary | ICD-10-CM | POA: Diagnosis not present

## 2022-01-04 DIAGNOSIS — I951 Orthostatic hypotension: Secondary | ICD-10-CM | POA: Diagnosis not present

## 2022-01-04 DIAGNOSIS — Z9181 History of falling: Secondary | ICD-10-CM | POA: Diagnosis not present

## 2022-01-06 ENCOUNTER — Telehealth: Payer: Self-pay | Admitting: Internal Medicine

## 2022-01-06 NOTE — Telephone Encounter (Signed)
Orders have been printed and placed in providers purple folder.

## 2022-01-06 NOTE — Telephone Encounter (Signed)
Sabrina Page from [place] [facility] said she has faxed over home health paperwork for this patient twice since July. She said she spoke with shannon and was told that the paperwork was on the providers desk on 12/02/21. She said she was told the paperwork would be completed in 7-10 business days. She said she is going to fax the paperwork over again today.

## 2022-01-07 ENCOUNTER — Other Ambulatory Visit: Payer: Self-pay | Admitting: Internal Medicine

## 2022-01-08 DIAGNOSIS — Z7952 Long term (current) use of systemic steroids: Secondary | ICD-10-CM | POA: Diagnosis not present

## 2022-01-08 DIAGNOSIS — Z7982 Long term (current) use of aspirin: Secondary | ICD-10-CM | POA: Diagnosis not present

## 2022-01-08 DIAGNOSIS — F0280 Dementia in other diseases classified elsewhere without behavioral disturbance: Secondary | ICD-10-CM | POA: Diagnosis not present

## 2022-01-08 DIAGNOSIS — G309 Alzheimer's disease, unspecified: Secondary | ICD-10-CM | POA: Diagnosis not present

## 2022-01-08 DIAGNOSIS — R5381 Other malaise: Secondary | ICD-10-CM | POA: Diagnosis not present

## 2022-01-08 DIAGNOSIS — I951 Orthostatic hypotension: Secondary | ICD-10-CM | POA: Diagnosis not present

## 2022-01-16 DIAGNOSIS — G309 Alzheimer's disease, unspecified: Secondary | ICD-10-CM | POA: Diagnosis not present

## 2022-01-16 DIAGNOSIS — F0280 Dementia in other diseases classified elsewhere without behavioral disturbance: Secondary | ICD-10-CM | POA: Diagnosis not present

## 2022-01-16 DIAGNOSIS — Z7952 Long term (current) use of systemic steroids: Secondary | ICD-10-CM | POA: Diagnosis not present

## 2022-01-16 DIAGNOSIS — R5381 Other malaise: Secondary | ICD-10-CM | POA: Diagnosis not present

## 2022-01-16 DIAGNOSIS — I951 Orthostatic hypotension: Secondary | ICD-10-CM | POA: Diagnosis not present

## 2022-01-16 DIAGNOSIS — Z7982 Long term (current) use of aspirin: Secondary | ICD-10-CM | POA: Diagnosis not present

## 2022-01-18 ENCOUNTER — Other Ambulatory Visit: Payer: Self-pay | Admitting: Internal Medicine

## 2022-01-18 DIAGNOSIS — Z79891 Long term (current) use of opiate analgesic: Secondary | ICD-10-CM | POA: Diagnosis not present

## 2022-01-18 DIAGNOSIS — F0280 Dementia in other diseases classified elsewhere without behavioral disturbance: Secondary | ICD-10-CM | POA: Diagnosis not present

## 2022-01-18 DIAGNOSIS — G309 Alzheimer's disease, unspecified: Secondary | ICD-10-CM | POA: Diagnosis not present

## 2022-01-18 DIAGNOSIS — Z7952 Long term (current) use of systemic steroids: Secondary | ICD-10-CM | POA: Diagnosis not present

## 2022-01-18 DIAGNOSIS — R5381 Other malaise: Secondary | ICD-10-CM | POA: Diagnosis not present

## 2022-01-18 DIAGNOSIS — Z7982 Long term (current) use of aspirin: Secondary | ICD-10-CM | POA: Diagnosis not present

## 2022-01-18 DIAGNOSIS — Z9181 History of falling: Secondary | ICD-10-CM | POA: Diagnosis not present

## 2022-01-18 DIAGNOSIS — I951 Orthostatic hypotension: Secondary | ICD-10-CM | POA: Diagnosis not present

## 2022-01-23 DIAGNOSIS — Z7982 Long term (current) use of aspirin: Secondary | ICD-10-CM | POA: Diagnosis not present

## 2022-01-23 DIAGNOSIS — G309 Alzheimer's disease, unspecified: Secondary | ICD-10-CM | POA: Diagnosis not present

## 2022-01-23 DIAGNOSIS — F0280 Dementia in other diseases classified elsewhere without behavioral disturbance: Secondary | ICD-10-CM | POA: Diagnosis not present

## 2022-01-23 DIAGNOSIS — R5381 Other malaise: Secondary | ICD-10-CM | POA: Diagnosis not present

## 2022-01-23 DIAGNOSIS — I951 Orthostatic hypotension: Secondary | ICD-10-CM | POA: Diagnosis not present

## 2022-01-23 DIAGNOSIS — Z7952 Long term (current) use of systemic steroids: Secondary | ICD-10-CM | POA: Diagnosis not present

## 2022-01-30 DIAGNOSIS — R5381 Other malaise: Secondary | ICD-10-CM | POA: Diagnosis not present

## 2022-01-30 DIAGNOSIS — F0280 Dementia in other diseases classified elsewhere without behavioral disturbance: Secondary | ICD-10-CM | POA: Diagnosis not present

## 2022-01-30 DIAGNOSIS — I951 Orthostatic hypotension: Secondary | ICD-10-CM | POA: Diagnosis not present

## 2022-01-30 DIAGNOSIS — G309 Alzheimer's disease, unspecified: Secondary | ICD-10-CM | POA: Diagnosis not present

## 2022-01-30 DIAGNOSIS — Z7982 Long term (current) use of aspirin: Secondary | ICD-10-CM | POA: Diagnosis not present

## 2022-01-30 DIAGNOSIS — Z7952 Long term (current) use of systemic steroids: Secondary | ICD-10-CM | POA: Diagnosis not present

## 2022-02-01 ENCOUNTER — Other Ambulatory Visit: Payer: Self-pay | Admitting: Internal Medicine

## 2022-02-17 ENCOUNTER — Encounter: Payer: Self-pay | Admitting: Podiatry

## 2022-02-17 ENCOUNTER — Ambulatory Visit (INDEPENDENT_AMBULATORY_CARE_PROVIDER_SITE_OTHER): Payer: Medicare Other | Admitting: Podiatry

## 2022-02-17 DIAGNOSIS — B351 Tinea unguium: Secondary | ICD-10-CM

## 2022-02-17 DIAGNOSIS — D696 Thrombocytopenia, unspecified: Secondary | ICD-10-CM | POA: Diagnosis not present

## 2022-02-17 DIAGNOSIS — M79674 Pain in right toe(s): Secondary | ICD-10-CM

## 2022-02-17 DIAGNOSIS — M79675 Pain in left toe(s): Secondary | ICD-10-CM

## 2022-02-17 NOTE — Progress Notes (Signed)
This patient presents  to my office for at risk foot care.  This patient requires this care by a professional since this patient will be at risk due to having thrombocytopenia.  This patient is unable to cut nails herself since the patient cannot reach her nails.These nails are painful walking and wearing shoes.   Patient presents to the office in a wheelchair.  She presents to the office with her caregiver. This patient presents for at risk foot care today.  General Appearance  Alert, conversant and in no acute stress.  Vascular  Dorsalis pedis and posterior tibial  pulses are absent  bilaterally.  Purple feet noted.  Capillary return is within normal limits  bilaterally. Cold feet. bilaterally.  Neurologic  deferred since she does not answer properly.  Nails Thick disfigured discolored nails with subungual debris  from hallux to fifth toes bilaterally. No evidence of bacterial infection or drainage bilaterally.  Orthopedic  No limitations of motion  feet .  No crepitus or effusions noted.  No bony pathology or digital deformities noted.  Skin  normotropic skin with no porokeratosis noted bilaterally.  No signs of infections or ulcers noted.     Onychomycosis  Pain in right toes  Pain in left toes  Consent was obtained for treatment procedures.   Mechanical debridement of nails 1-5  bilaterally performed with a nail nipper.  Filed with dremel without incident.    Return office visit    3 months                  Told patient to return for periodic foot care and evaluation due to potential at risk complications.   Gardiner Barefoot DPM

## 2022-03-11 ENCOUNTER — Other Ambulatory Visit: Payer: Self-pay | Admitting: Internal Medicine

## 2022-03-22 ENCOUNTER — Encounter: Payer: Self-pay | Admitting: Internal Medicine

## 2022-03-22 ENCOUNTER — Telehealth (INDEPENDENT_AMBULATORY_CARE_PROVIDER_SITE_OTHER): Payer: Medicare Other | Admitting: Internal Medicine

## 2022-03-22 DIAGNOSIS — G309 Alzheimer's disease, unspecified: Secondary | ICD-10-CM | POA: Diagnosis not present

## 2022-03-22 DIAGNOSIS — N3090 Cystitis, unspecified without hematuria: Secondary | ICD-10-CM

## 2022-03-22 DIAGNOSIS — F0280 Dementia in other diseases classified elsewhere without behavioral disturbance: Secondary | ICD-10-CM | POA: Diagnosis not present

## 2022-03-22 MED ORDER — CIPROFLOXACIN HCL 250 MG PO TABS: 250.0000 mg | ORAL_TABLET | Freq: Two times a day (BID) | ORAL | 2 refills | Status: DC

## 2022-03-22 NOTE — Progress Notes (Signed)
Virtual Visit via Video Note  I connected with Sabrina Page on 03/22/22 at  7:50 AM EDT by a video enabled telemedicine application and verified that I am speaking with the correct person using two identifiers.   I discussed the limitations of evaluation and management by telemedicine and the availability of in person appointments. The patient's dtr (POA) expressed understanding and agreed to proceed.  I was located at our [facility] office. The patient was at home. Sabrina Page is her POA -  present in the visit.  No chief complaint on file.    History of Present Illness: Speaking w/Dtr Sabrina Page is her POA. F/u on FTT, Alzheimer's dementia, probable UTI  Review of Systems  Constitutional:  Positive for malaise/fatigue.  Neurological:  Positive for weakness.  Psychiatric/Behavioral:  Positive for memory loss. The patient is not nervous/anxious.   Chronic confusion    Observations/Objective: The patient appears to be in no acute distress. She is bedridden.  Alert, disoriented.  Letter was dictated   Assessment and Plan:  Problem List Items Addressed This Visit     Alzheimer disease ([place])    Dtr Sabrina Page is her POA. The patient is currently on [place] lab work to be drawn at home -the patient was referred to home care agency.  She needs to be assessed for physical therapy due to failure to thrive Treat presumed UTI empirically       Cystitis    Recurrent Unable to get a UA Cipro po prn - using clinical signs      Dementia due to Alzheimer's disease ([place])    Worse.  Treatment-as above Dtr Sabrina Page is her POA.      Relevant Orders   Ambulatory referral to [place] ordered this encounter  Medications   ciprofloxacin (CIPRO) 250 MG tablet    Sig: Take 1 tablet (250 mg total) by mouth 2 (two) times daily. Use prn cystitis    Dispense:  14 tablet    Refill:  2     Follow Up Instructions:    I discussed the assessment and  treatment plan with the patient. The patient was provided an opportunity to ask questions and all were answered. The patient agreed with the plan and demonstrated an understanding of the instructions.   The patient was advised to call back or seek an in-person evaluation if the symptoms worsen or if the condition fails to improve as anticipated.  I provided face-to-face time during this encounter. We were at different locations.   [name], MD

## 2022-03-22 NOTE — Assessment & Plan Note (Addendum)
Dtr [name] is her POA. The patient is currently on [place] lab work to be drawn at home -the patient was referred to home care agency.  She needs to be assessed for physical therapy due to failure to thrive Treat presumed UTI empirically

## 2022-03-22 NOTE — Assessment & Plan Note (Addendum)
Recurrent Unable to get a UA Cipro po prn - using clinical signs

## 2022-03-22 NOTE — Assessment & Plan Note (Addendum)
Worse.  Treatment-as above Dtr [name] is her POA.

## 2022-03-24 DIAGNOSIS — G309 Alzheimer's disease, unspecified: Secondary | ICD-10-CM | POA: Diagnosis not present

## 2022-03-24 DIAGNOSIS — Z7982 Long term (current) use of aspirin: Secondary | ICD-10-CM | POA: Diagnosis not present

## 2022-03-24 DIAGNOSIS — F0280 Dementia in other diseases classified elsewhere without behavioral disturbance: Secondary | ICD-10-CM | POA: Diagnosis not present

## 2022-03-24 DIAGNOSIS — N3000 Acute cystitis without hematuria: Secondary | ICD-10-CM | POA: Diagnosis not present

## 2022-03-24 DIAGNOSIS — R627 Adult failure to thrive: Secondary | ICD-10-CM | POA: Diagnosis not present

## 2022-03-24 DIAGNOSIS — Z9181 History of falling: Secondary | ICD-10-CM | POA: Diagnosis not present

## 2022-03-27 ENCOUNTER — Other Ambulatory Visit: Payer: Self-pay | Admitting: Internal Medicine

## 2022-03-28 ENCOUNTER — Other Ambulatory Visit: Payer: Self-pay | Admitting: Internal Medicine

## 2022-03-29 DIAGNOSIS — G309 Alzheimer's disease, unspecified: Secondary | ICD-10-CM | POA: Diagnosis not present

## 2022-03-29 DIAGNOSIS — Z9181 History of falling: Secondary | ICD-10-CM | POA: Diagnosis not present

## 2022-03-29 DIAGNOSIS — Z7982 Long term (current) use of aspirin: Secondary | ICD-10-CM | POA: Diagnosis not present

## 2022-03-29 DIAGNOSIS — N3000 Acute cystitis without hematuria: Secondary | ICD-10-CM | POA: Diagnosis not present

## 2022-03-29 DIAGNOSIS — R627 Adult failure to thrive: Secondary | ICD-10-CM | POA: Diagnosis not present

## 2022-03-29 DIAGNOSIS — F0280 Dementia in other diseases classified elsewhere without behavioral disturbance: Secondary | ICD-10-CM | POA: Diagnosis not present

## 2022-04-05 DIAGNOSIS — F0280 Dementia in other diseases classified elsewhere without behavioral disturbance: Secondary | ICD-10-CM | POA: Diagnosis not present

## 2022-04-05 DIAGNOSIS — Z9181 History of falling: Secondary | ICD-10-CM | POA: Diagnosis not present

## 2022-04-05 DIAGNOSIS — R627 Adult failure to thrive: Secondary | ICD-10-CM | POA: Diagnosis not present

## 2022-04-05 DIAGNOSIS — G309 Alzheimer's disease, unspecified: Secondary | ICD-10-CM | POA: Diagnosis not present

## 2022-04-05 DIAGNOSIS — Z7982 Long term (current) use of aspirin: Secondary | ICD-10-CM | POA: Diagnosis not present

## 2022-04-05 DIAGNOSIS — N3000 Acute cystitis without hematuria: Secondary | ICD-10-CM | POA: Diagnosis not present

## 2022-04-08 DIAGNOSIS — F0280 Dementia in other diseases classified elsewhere without behavioral disturbance: Secondary | ICD-10-CM | POA: Diagnosis not present

## 2022-04-08 DIAGNOSIS — Z7982 Long term (current) use of aspirin: Secondary | ICD-10-CM | POA: Diagnosis not present

## 2022-04-08 DIAGNOSIS — G309 Alzheimer's disease, unspecified: Secondary | ICD-10-CM | POA: Diagnosis not present

## 2022-04-08 DIAGNOSIS — R627 Adult failure to thrive: Secondary | ICD-10-CM | POA: Diagnosis not present

## 2022-04-08 DIAGNOSIS — Z9181 History of falling: Secondary | ICD-10-CM | POA: Diagnosis not present

## 2022-04-08 DIAGNOSIS — N3000 Acute cystitis without hematuria: Secondary | ICD-10-CM | POA: Diagnosis not present

## 2022-04-12 DIAGNOSIS — G309 Alzheimer's disease, unspecified: Secondary | ICD-10-CM | POA: Diagnosis not present

## 2022-04-12 DIAGNOSIS — Z7982 Long term (current) use of aspirin: Secondary | ICD-10-CM | POA: Diagnosis not present

## 2022-04-12 DIAGNOSIS — F0280 Dementia in other diseases classified elsewhere without behavioral disturbance: Secondary | ICD-10-CM | POA: Diagnosis not present

## 2022-04-12 DIAGNOSIS — R627 Adult failure to thrive: Secondary | ICD-10-CM | POA: Diagnosis not present

## 2022-04-12 DIAGNOSIS — N3000 Acute cystitis without hematuria: Secondary | ICD-10-CM | POA: Diagnosis not present

## 2022-04-12 DIAGNOSIS — Z9181 History of falling: Secondary | ICD-10-CM | POA: Diagnosis not present

## 2022-04-13 ENCOUNTER — Other Ambulatory Visit: Payer: Self-pay | Admitting: Internal Medicine

## 2022-04-14 ENCOUNTER — Telehealth: Payer: Self-pay | Admitting: Internal Medicine

## 2022-04-14 DIAGNOSIS — N3090 Cystitis, unspecified without hematuria: Secondary | ICD-10-CM

## 2022-04-14 DIAGNOSIS — R269 Unspecified abnormalities of gait and mobility: Secondary | ICD-10-CM

## 2022-04-14 DIAGNOSIS — F0280 Dementia in other diseases classified elsewhere without behavioral disturbance: Secondary | ICD-10-CM

## 2022-04-14 DIAGNOSIS — I10 Essential (primary) hypertension: Secondary | ICD-10-CM

## 2022-04-14 DIAGNOSIS — I5032 Chronic diastolic (congestive) heart failure: Secondary | ICD-10-CM

## 2022-04-14 MED ORDER — RIVAROXABAN 10 MG PO TABS: 10.0000 mg | ORAL_TABLET | Freq: Every day | ORAL | 5 refills | Status: DC

## 2022-04-14 MED ORDER — FLUDROCORTISONE ACETATE 0.1 MG PO TABS: ORAL_TABLET | ORAL | 1 refills | Status: DC

## 2022-04-14 NOTE — Telephone Encounter (Signed)
Patient's daughter would like labs ordered for her mom.  They need to be ordered to [facility] - Please call daughter and let her know when labs have been ordered.

## 2022-04-15 NOTE — Telephone Encounter (Signed)
Okay CBC, c-Met, UA, TSH Thanks

## 2022-04-19 NOTE — Telephone Encounter (Signed)
Entered labs and print & faxed to [facility]../l,mb

## 2022-04-19 NOTE — Telephone Encounter (Signed)
Sabrina Page inform MD ok lab but need # to contact [place] Nurse abt labs. Daughter states she does not have the number right now. She will call back w/ # to call.Marland KitchenJohny Chess

## 2022-04-19 NOTE — Telephone Encounter (Signed)
Daughter returns call today with the fax number for [facility] so that lab orders can be sent.  FAX: [phone]

## 2022-04-21 ENCOUNTER — Telehealth: Payer: Self-pay | Admitting: Internal Medicine

## 2022-04-21 DIAGNOSIS — R627 Adult failure to thrive: Secondary | ICD-10-CM | POA: Diagnosis not present

## 2022-04-21 DIAGNOSIS — G309 Alzheimer's disease, unspecified: Secondary | ICD-10-CM | POA: Diagnosis not present

## 2022-04-21 DIAGNOSIS — N3000 Acute cystitis without hematuria: Secondary | ICD-10-CM | POA: Diagnosis not present

## 2022-04-21 DIAGNOSIS — Z7982 Long term (current) use of aspirin: Secondary | ICD-10-CM | POA: Diagnosis not present

## 2022-04-21 DIAGNOSIS — F0280 Dementia in other diseases classified elsewhere without behavioral disturbance: Secondary | ICD-10-CM | POA: Diagnosis not present

## 2022-04-21 DIAGNOSIS — Z9181 History of falling: Secondary | ICD-10-CM | POA: Diagnosis not present

## 2022-04-21 LAB — CBC AND DIFFERENTIAL
HCT: 42 (ref 36–46)
Hemoglobin: 13.5 (ref 12.0–16.0)
WBC: 5.6

## 2022-04-21 LAB — CBC: RBC: 4.75 (ref 3.87–5.11)

## 2022-04-21 LAB — COMPREHENSIVE METABOLIC PANEL
Calcium: 9.1 (ref 8.7–10.7)
eGFR: 87

## 2022-04-21 LAB — BASIC METABOLIC PANEL
BUN: 8 (ref 4–21)
CO2: 25 — AB (ref 13–22)
Chloride: 103 (ref 99–108)
Creatinine: 0.6 (ref 0.5–1.1)
Glucose: 102
Potassium: 4.2 mEq/L (ref 3.5–5.1)
Sodium: 142 (ref 137–147)

## 2022-04-21 LAB — HEPATIC FUNCTION PANEL
ALT: 8 U/L (ref 7–35)
AST: 18 (ref 13–35)
Alkaline Phosphatase: 65 (ref 25–125)
Bilirubin, Total: 0.4

## 2022-04-21 NOTE — Telephone Encounter (Signed)
Home health could not collect the urine sample.

## 2022-04-23 DIAGNOSIS — Z9181 History of falling: Secondary | ICD-10-CM | POA: Diagnosis not present

## 2022-04-23 DIAGNOSIS — N3000 Acute cystitis without hematuria: Secondary | ICD-10-CM | POA: Diagnosis not present

## 2022-04-23 DIAGNOSIS — F0280 Dementia in other diseases classified elsewhere without behavioral disturbance: Secondary | ICD-10-CM | POA: Diagnosis not present

## 2022-04-23 DIAGNOSIS — Z7982 Long term (current) use of aspirin: Secondary | ICD-10-CM | POA: Diagnosis not present

## 2022-04-23 DIAGNOSIS — G309 Alzheimer's disease, unspecified: Secondary | ICD-10-CM | POA: Diagnosis not present

## 2022-04-23 DIAGNOSIS — R627 Adult failure to thrive: Secondary | ICD-10-CM | POA: Diagnosis not present

## 2022-04-23 NOTE — Telephone Encounter (Signed)
Noted. Thanks.

## 2022-04-26 DIAGNOSIS — Z7982 Long term (current) use of aspirin: Secondary | ICD-10-CM | POA: Diagnosis not present

## 2022-04-26 DIAGNOSIS — Z9181 History of falling: Secondary | ICD-10-CM | POA: Diagnosis not present

## 2022-04-26 DIAGNOSIS — N3000 Acute cystitis without hematuria: Secondary | ICD-10-CM | POA: Diagnosis not present

## 2022-04-26 DIAGNOSIS — G309 Alzheimer's disease, unspecified: Secondary | ICD-10-CM | POA: Diagnosis not present

## 2022-04-26 DIAGNOSIS — R627 Adult failure to thrive: Secondary | ICD-10-CM | POA: Diagnosis not present

## 2022-04-26 DIAGNOSIS — F0280 Dementia in other diseases classified elsewhere without behavioral disturbance: Secondary | ICD-10-CM | POA: Diagnosis not present

## 2022-04-27 ENCOUNTER — Encounter: Payer: Self-pay | Admitting: Internal Medicine

## 2022-05-05 DIAGNOSIS — F0280 Dementia in other diseases classified elsewhere without behavioral disturbance: Secondary | ICD-10-CM | POA: Diagnosis not present

## 2022-05-05 DIAGNOSIS — G309 Alzheimer's disease, unspecified: Secondary | ICD-10-CM | POA: Diagnosis not present

## 2022-05-05 DIAGNOSIS — Z7982 Long term (current) use of aspirin: Secondary | ICD-10-CM | POA: Diagnosis not present

## 2022-05-05 DIAGNOSIS — Z9181 History of falling: Secondary | ICD-10-CM | POA: Diagnosis not present

## 2022-05-05 DIAGNOSIS — N3000 Acute cystitis without hematuria: Secondary | ICD-10-CM | POA: Diagnosis not present

## 2022-05-05 DIAGNOSIS — R627 Adult failure to thrive: Secondary | ICD-10-CM | POA: Diagnosis not present

## 2022-06-12 ENCOUNTER — Other Ambulatory Visit: Payer: Self-pay | Admitting: Internal Medicine

## 2022-09-20 ENCOUNTER — Encounter: Payer: Self-pay | Admitting: Internal Medicine

## 2022-09-20 ENCOUNTER — Telehealth (INDEPENDENT_AMBULATORY_CARE_PROVIDER_SITE_OTHER): Payer: Medicare Other | Admitting: Internal Medicine

## 2022-09-20 DIAGNOSIS — G309 Alzheimer's disease, unspecified: Secondary | ICD-10-CM

## 2022-09-20 DIAGNOSIS — I5032 Chronic diastolic (congestive) heart failure: Secondary | ICD-10-CM

## 2022-09-20 DIAGNOSIS — K625 Hemorrhage of anus and rectum: Secondary | ICD-10-CM | POA: Insufficient documentation

## 2022-09-20 DIAGNOSIS — R1111 Vomiting without nausea: Secondary | ICD-10-CM | POA: Insufficient documentation

## 2022-09-20 DIAGNOSIS — F0280 Dementia in other diseases classified elsewhere without behavioral disturbance: Secondary | ICD-10-CM | POA: Diagnosis not present

## 2022-09-20 DIAGNOSIS — Z66 Do not resuscitate: Secondary | ICD-10-CM | POA: Insufficient documentation

## 2022-09-20 DIAGNOSIS — R1011 Right upper quadrant pain: Secondary | ICD-10-CM | POA: Insufficient documentation

## 2022-09-20 DIAGNOSIS — K5900 Constipation, unspecified: Secondary | ICD-10-CM | POA: Insufficient documentation

## 2022-09-20 NOTE — Assessment & Plan Note (Signed)
Stable Cont on current Rx

## 2022-09-20 NOTE — Progress Notes (Addendum)
Virtual Visit via Video Note  I connected with Sabrina Page on 09/20/22 at  8:50 AM EDT by a video enabled telemedicine application and verified that I am speaking with the correct person using two identifiers.   I discussed the limitations of evaluation and management by telemedicine and the availability of in person appointments. The patient expressed understanding and agreed to proceed.  I was located at our [facility] office. The patient was at home. There was dtr Garnett Farm present in the visit.  No chief complaint on file.    History of Present Illness:   Review of Systems  Constitutional:  Negative for fever.  HENT:  Negative for hearing loss.   Respiratory:  Negative for cough.   Cardiovascular:  Negative for chest pain.  Musculoskeletal:  Positive for back pain and falls.  Psychiatric/Behavioral:  Positive for memory loss. Negative for suicidal ideas.      Observations/Objective: The patient appears to be in no acute distress. In bed, alert and  disoriented  Assessment and Plan:  Problem List Items Addressed This Visit     Alzheimer disease (HCC) - Primary   Relevant Orders   Ambulatory referral to Hospice   Chronic diastolic heart failure (HCC)    Stable Cont on current Rx      DNR (do not resuscitate)    Discussed w/dtr, pt        No orders of the defined types were placed in this encounter.    Follow Up Instructions:    I discussed the assessment and treatment plan with the patient. The patient was provided an opportunity to ask questions and all were answered. The patient agreed with the plan and demonstrated an understanding of the instructions.   The patient was advised to call back or seek an in-person evaluation if the symptoms worsen or if the condition fails to improve as anticipated.  I provided face-to-face time during this encounter. We were at different locations.   Sonda Primes, MD

## 2022-09-20 NOTE — Assessment & Plan Note (Signed)
Discussed w/dtr, pt

## 2022-09-20 NOTE — Addendum Note (Signed)
Addended by: Tresa Garter on: 09/20/2022 09:19 AM   Modules accepted: Level of Service

## 2022-09-21 ENCOUNTER — Other Ambulatory Visit: Payer: Self-pay | Admitting: Internal Medicine

## 2022-09-21 DIAGNOSIS — M549 Dorsalgia, unspecified: Secondary | ICD-10-CM | POA: Diagnosis not present

## 2022-09-21 DIAGNOSIS — Z515 Encounter for palliative care: Secondary | ICD-10-CM | POA: Diagnosis not present

## 2022-09-21 DIAGNOSIS — I5032 Chronic diastolic (congestive) heart failure: Secondary | ICD-10-CM | POA: Diagnosis not present

## 2022-09-21 DIAGNOSIS — Z66 Do not resuscitate: Secondary | ICD-10-CM | POA: Diagnosis not present

## 2022-09-21 DIAGNOSIS — F0280 Dementia in other diseases classified elsewhere without behavioral disturbance: Secondary | ICD-10-CM | POA: Diagnosis not present

## 2022-09-21 DIAGNOSIS — G309 Alzheimer's disease, unspecified: Secondary | ICD-10-CM | POA: Diagnosis not present

## 2022-09-21 DIAGNOSIS — R32 Unspecified urinary incontinence: Secondary | ICD-10-CM | POA: Diagnosis not present

## 2022-09-21 DIAGNOSIS — R159 Full incontinence of feces: Secondary | ICD-10-CM | POA: Diagnosis not present

## 2022-09-21 DIAGNOSIS — Z741 Need for assistance with personal care: Secondary | ICD-10-CM | POA: Diagnosis not present

## 2022-09-22 DIAGNOSIS — G309 Alzheimer's disease, unspecified: Secondary | ICD-10-CM | POA: Diagnosis not present

## 2022-09-22 DIAGNOSIS — F0280 Dementia in other diseases classified elsewhere without behavioral disturbance: Secondary | ICD-10-CM | POA: Diagnosis not present

## 2022-09-22 DIAGNOSIS — R159 Full incontinence of feces: Secondary | ICD-10-CM | POA: Diagnosis not present

## 2022-09-22 DIAGNOSIS — I5032 Chronic diastolic (congestive) heart failure: Secondary | ICD-10-CM | POA: Diagnosis not present

## 2022-09-22 DIAGNOSIS — M549 Dorsalgia, unspecified: Secondary | ICD-10-CM | POA: Diagnosis not present

## 2022-09-22 DIAGNOSIS — R32 Unspecified urinary incontinence: Secondary | ICD-10-CM | POA: Diagnosis not present

## 2022-09-22 DIAGNOSIS — Z741 Need for assistance with personal care: Secondary | ICD-10-CM | POA: Diagnosis not present

## 2022-09-22 DIAGNOSIS — Z515 Encounter for palliative care: Secondary | ICD-10-CM | POA: Diagnosis not present

## 2022-09-22 DIAGNOSIS — Z66 Do not resuscitate: Secondary | ICD-10-CM | POA: Diagnosis not present

## 2022-09-23 DIAGNOSIS — G309 Alzheimer's disease, unspecified: Secondary | ICD-10-CM | POA: Diagnosis not present

## 2022-09-23 DIAGNOSIS — F0280 Dementia in other diseases classified elsewhere without behavioral disturbance: Secondary | ICD-10-CM | POA: Diagnosis not present

## 2022-09-23 DIAGNOSIS — Z741 Need for assistance with personal care: Secondary | ICD-10-CM | POA: Diagnosis not present

## 2022-09-23 DIAGNOSIS — I5032 Chronic diastolic (congestive) heart failure: Secondary | ICD-10-CM | POA: Diagnosis not present

## 2022-09-23 DIAGNOSIS — M549 Dorsalgia, unspecified: Secondary | ICD-10-CM | POA: Diagnosis not present

## 2022-09-23 DIAGNOSIS — R32 Unspecified urinary incontinence: Secondary | ICD-10-CM | POA: Diagnosis not present

## 2022-09-24 DIAGNOSIS — M549 Dorsalgia, unspecified: Secondary | ICD-10-CM | POA: Diagnosis not present

## 2022-09-24 DIAGNOSIS — R32 Unspecified urinary incontinence: Secondary | ICD-10-CM | POA: Diagnosis not present

## 2022-09-24 DIAGNOSIS — Z741 Need for assistance with personal care: Secondary | ICD-10-CM | POA: Diagnosis not present

## 2022-09-24 DIAGNOSIS — I5032 Chronic diastolic (congestive) heart failure: Secondary | ICD-10-CM | POA: Diagnosis not present

## 2022-09-24 DIAGNOSIS — F0280 Dementia in other diseases classified elsewhere without behavioral disturbance: Secondary | ICD-10-CM | POA: Diagnosis not present

## 2022-09-24 DIAGNOSIS — G309 Alzheimer's disease, unspecified: Secondary | ICD-10-CM | POA: Diagnosis not present

## 2022-09-27 ENCOUNTER — Other Ambulatory Visit: Payer: Self-pay | Admitting: Internal Medicine

## 2022-09-29 DIAGNOSIS — I5032 Chronic diastolic (congestive) heart failure: Secondary | ICD-10-CM | POA: Diagnosis not present

## 2022-09-29 DIAGNOSIS — R32 Unspecified urinary incontinence: Secondary | ICD-10-CM | POA: Diagnosis not present

## 2022-09-29 DIAGNOSIS — F0280 Dementia in other diseases classified elsewhere without behavioral disturbance: Secondary | ICD-10-CM | POA: Diagnosis not present

## 2022-09-29 DIAGNOSIS — M549 Dorsalgia, unspecified: Secondary | ICD-10-CM | POA: Diagnosis not present

## 2022-09-29 DIAGNOSIS — G309 Alzheimer's disease, unspecified: Secondary | ICD-10-CM | POA: Diagnosis not present

## 2022-09-29 DIAGNOSIS — Z741 Need for assistance with personal care: Secondary | ICD-10-CM | POA: Diagnosis not present

## 2022-09-30 DIAGNOSIS — Z741 Need for assistance with personal care: Secondary | ICD-10-CM | POA: Diagnosis not present

## 2022-09-30 DIAGNOSIS — G309 Alzheimer's disease, unspecified: Secondary | ICD-10-CM | POA: Diagnosis not present

## 2022-09-30 DIAGNOSIS — M549 Dorsalgia, unspecified: Secondary | ICD-10-CM | POA: Diagnosis not present

## 2022-09-30 DIAGNOSIS — I5032 Chronic diastolic (congestive) heart failure: Secondary | ICD-10-CM | POA: Diagnosis not present

## 2022-09-30 DIAGNOSIS — R32 Unspecified urinary incontinence: Secondary | ICD-10-CM | POA: Diagnosis not present

## 2022-09-30 DIAGNOSIS — F0280 Dementia in other diseases classified elsewhere without behavioral disturbance: Secondary | ICD-10-CM | POA: Diagnosis not present

## 2022-10-01 DIAGNOSIS — M549 Dorsalgia, unspecified: Secondary | ICD-10-CM | POA: Diagnosis not present

## 2022-10-01 DIAGNOSIS — Z741 Need for assistance with personal care: Secondary | ICD-10-CM | POA: Diagnosis not present

## 2022-10-01 DIAGNOSIS — R32 Unspecified urinary incontinence: Secondary | ICD-10-CM | POA: Diagnosis not present

## 2022-10-01 DIAGNOSIS — G309 Alzheimer's disease, unspecified: Secondary | ICD-10-CM | POA: Diagnosis not present

## 2022-10-01 DIAGNOSIS — F0280 Dementia in other diseases classified elsewhere without behavioral disturbance: Secondary | ICD-10-CM | POA: Diagnosis not present

## 2022-10-01 DIAGNOSIS — I5032 Chronic diastolic (congestive) heart failure: Secondary | ICD-10-CM | POA: Diagnosis not present

## 2022-10-05 DIAGNOSIS — Z741 Need for assistance with personal care: Secondary | ICD-10-CM | POA: Diagnosis not present

## 2022-10-05 DIAGNOSIS — R32 Unspecified urinary incontinence: Secondary | ICD-10-CM | POA: Diagnosis not present

## 2022-10-05 DIAGNOSIS — M549 Dorsalgia, unspecified: Secondary | ICD-10-CM | POA: Diagnosis not present

## 2022-10-05 DIAGNOSIS — I5032 Chronic diastolic (congestive) heart failure: Secondary | ICD-10-CM | POA: Diagnosis not present

## 2022-10-05 DIAGNOSIS — G309 Alzheimer's disease, unspecified: Secondary | ICD-10-CM | POA: Diagnosis not present

## 2022-10-05 DIAGNOSIS — F0280 Dementia in other diseases classified elsewhere without behavioral disturbance: Secondary | ICD-10-CM | POA: Diagnosis not present

## 2022-10-05 NOTE — Telephone Encounter (Signed)
Patient is in hospice and will not need anymore prescriptions sent in.  She entered hospice on 09/21/2022

## 2022-10-06 DIAGNOSIS — M549 Dorsalgia, unspecified: Secondary | ICD-10-CM | POA: Diagnosis not present

## 2022-10-06 DIAGNOSIS — I5032 Chronic diastolic (congestive) heart failure: Secondary | ICD-10-CM | POA: Diagnosis not present

## 2022-10-06 DIAGNOSIS — G309 Alzheimer's disease, unspecified: Secondary | ICD-10-CM | POA: Diagnosis not present

## 2022-10-06 DIAGNOSIS — R32 Unspecified urinary incontinence: Secondary | ICD-10-CM | POA: Diagnosis not present

## 2022-10-06 DIAGNOSIS — Z741 Need for assistance with personal care: Secondary | ICD-10-CM | POA: Diagnosis not present

## 2022-10-06 DIAGNOSIS — F0280 Dementia in other diseases classified elsewhere without behavioral disturbance: Secondary | ICD-10-CM | POA: Diagnosis not present

## 2022-10-07 DIAGNOSIS — F0280 Dementia in other diseases classified elsewhere without behavioral disturbance: Secondary | ICD-10-CM | POA: Diagnosis not present

## 2022-10-07 DIAGNOSIS — R32 Unspecified urinary incontinence: Secondary | ICD-10-CM | POA: Diagnosis not present

## 2022-10-07 DIAGNOSIS — Z741 Need for assistance with personal care: Secondary | ICD-10-CM | POA: Diagnosis not present

## 2022-10-07 DIAGNOSIS — M549 Dorsalgia, unspecified: Secondary | ICD-10-CM | POA: Diagnosis not present

## 2022-10-07 DIAGNOSIS — I5032 Chronic diastolic (congestive) heart failure: Secondary | ICD-10-CM | POA: Diagnosis not present

## 2022-10-07 DIAGNOSIS — G309 Alzheimer's disease, unspecified: Secondary | ICD-10-CM | POA: Diagnosis not present

## 2022-10-07 NOTE — Telephone Encounter (Signed)
ERROR

## 2022-10-08 DIAGNOSIS — F0280 Dementia in other diseases classified elsewhere without behavioral disturbance: Secondary | ICD-10-CM | POA: Diagnosis not present

## 2022-10-08 DIAGNOSIS — Z741 Need for assistance with personal care: Secondary | ICD-10-CM | POA: Diagnosis not present

## 2022-10-08 DIAGNOSIS — I5032 Chronic diastolic (congestive) heart failure: Secondary | ICD-10-CM | POA: Diagnosis not present

## 2022-10-08 DIAGNOSIS — G309 Alzheimer's disease, unspecified: Secondary | ICD-10-CM | POA: Diagnosis not present

## 2022-10-08 DIAGNOSIS — M549 Dorsalgia, unspecified: Secondary | ICD-10-CM | POA: Diagnosis not present

## 2022-10-08 DIAGNOSIS — R32 Unspecified urinary incontinence: Secondary | ICD-10-CM | POA: Diagnosis not present

## 2022-10-13 DIAGNOSIS — M549 Dorsalgia, unspecified: Secondary | ICD-10-CM | POA: Diagnosis not present

## 2022-10-13 DIAGNOSIS — G309 Alzheimer's disease, unspecified: Secondary | ICD-10-CM | POA: Diagnosis not present

## 2022-10-13 DIAGNOSIS — Z741 Need for assistance with personal care: Secondary | ICD-10-CM | POA: Diagnosis not present

## 2022-10-13 DIAGNOSIS — I5032 Chronic diastolic (congestive) heart failure: Secondary | ICD-10-CM | POA: Diagnosis not present

## 2022-10-13 DIAGNOSIS — R32 Unspecified urinary incontinence: Secondary | ICD-10-CM | POA: Diagnosis not present

## 2022-10-13 DIAGNOSIS — F0280 Dementia in other diseases classified elsewhere without behavioral disturbance: Secondary | ICD-10-CM | POA: Diagnosis not present

## 2022-10-14 DIAGNOSIS — F0280 Dementia in other diseases classified elsewhere without behavioral disturbance: Secondary | ICD-10-CM | POA: Diagnosis not present

## 2022-10-14 DIAGNOSIS — R32 Unspecified urinary incontinence: Secondary | ICD-10-CM | POA: Diagnosis not present

## 2022-10-14 DIAGNOSIS — M549 Dorsalgia, unspecified: Secondary | ICD-10-CM | POA: Diagnosis not present

## 2022-10-14 DIAGNOSIS — I5032 Chronic diastolic (congestive) heart failure: Secondary | ICD-10-CM | POA: Diagnosis not present

## 2022-10-14 DIAGNOSIS — G309 Alzheimer's disease, unspecified: Secondary | ICD-10-CM | POA: Diagnosis not present

## 2022-10-14 DIAGNOSIS — Z741 Need for assistance with personal care: Secondary | ICD-10-CM | POA: Diagnosis not present

## 2022-10-15 DIAGNOSIS — Z741 Need for assistance with personal care: Secondary | ICD-10-CM | POA: Diagnosis not present

## 2022-10-15 DIAGNOSIS — I5032 Chronic diastolic (congestive) heart failure: Secondary | ICD-10-CM | POA: Diagnosis not present

## 2022-10-15 DIAGNOSIS — M549 Dorsalgia, unspecified: Secondary | ICD-10-CM | POA: Diagnosis not present

## 2022-10-15 DIAGNOSIS — F0280 Dementia in other diseases classified elsewhere without behavioral disturbance: Secondary | ICD-10-CM | POA: Diagnosis not present

## 2022-10-15 DIAGNOSIS — R32 Unspecified urinary incontinence: Secondary | ICD-10-CM | POA: Diagnosis not present

## 2022-10-15 DIAGNOSIS — G309 Alzheimer's disease, unspecified: Secondary | ICD-10-CM | POA: Diagnosis not present

## 2022-10-18 DIAGNOSIS — R32 Unspecified urinary incontinence: Secondary | ICD-10-CM | POA: Diagnosis not present

## 2022-10-18 DIAGNOSIS — M549 Dorsalgia, unspecified: Secondary | ICD-10-CM | POA: Diagnosis not present

## 2022-10-18 DIAGNOSIS — G309 Alzheimer's disease, unspecified: Secondary | ICD-10-CM | POA: Diagnosis not present

## 2022-10-18 DIAGNOSIS — Z741 Need for assistance with personal care: Secondary | ICD-10-CM | POA: Diagnosis not present

## 2022-10-18 DIAGNOSIS — F0280 Dementia in other diseases classified elsewhere without behavioral disturbance: Secondary | ICD-10-CM | POA: Diagnosis not present

## 2022-10-18 DIAGNOSIS — I5032 Chronic diastolic (congestive) heart failure: Secondary | ICD-10-CM | POA: Diagnosis not present

## 2022-10-20 DIAGNOSIS — M549 Dorsalgia, unspecified: Secondary | ICD-10-CM | POA: Diagnosis not present

## 2022-10-20 DIAGNOSIS — Z741 Need for assistance with personal care: Secondary | ICD-10-CM | POA: Diagnosis not present

## 2022-10-20 DIAGNOSIS — I5032 Chronic diastolic (congestive) heart failure: Secondary | ICD-10-CM | POA: Diagnosis not present

## 2022-10-20 DIAGNOSIS — R32 Unspecified urinary incontinence: Secondary | ICD-10-CM | POA: Diagnosis not present

## 2022-10-20 DIAGNOSIS — G309 Alzheimer's disease, unspecified: Secondary | ICD-10-CM | POA: Diagnosis not present

## 2022-10-20 DIAGNOSIS — F0280 Dementia in other diseases classified elsewhere without behavioral disturbance: Secondary | ICD-10-CM | POA: Diagnosis not present

## 2022-10-22 DIAGNOSIS — M549 Dorsalgia, unspecified: Secondary | ICD-10-CM | POA: Diagnosis not present

## 2022-10-22 DIAGNOSIS — G309 Alzheimer's disease, unspecified: Secondary | ICD-10-CM | POA: Diagnosis not present

## 2022-10-22 DIAGNOSIS — F0280 Dementia in other diseases classified elsewhere without behavioral disturbance: Secondary | ICD-10-CM | POA: Diagnosis not present

## 2022-10-22 DIAGNOSIS — I5032 Chronic diastolic (congestive) heart failure: Secondary | ICD-10-CM | POA: Diagnosis not present

## 2022-10-22 DIAGNOSIS — Z741 Need for assistance with personal care: Secondary | ICD-10-CM | POA: Diagnosis not present

## 2022-10-22 DIAGNOSIS — R32 Unspecified urinary incontinence: Secondary | ICD-10-CM | POA: Diagnosis not present

## 2022-10-23 DIAGNOSIS — R32 Unspecified urinary incontinence: Secondary | ICD-10-CM | POA: Diagnosis not present

## 2022-10-23 DIAGNOSIS — I5032 Chronic diastolic (congestive) heart failure: Secondary | ICD-10-CM | POA: Diagnosis not present

## 2022-10-23 DIAGNOSIS — Z66 Do not resuscitate: Secondary | ICD-10-CM | POA: Diagnosis not present

## 2022-10-23 DIAGNOSIS — F0280 Dementia in other diseases classified elsewhere without behavioral disturbance: Secondary | ICD-10-CM | POA: Diagnosis not present

## 2022-10-23 DIAGNOSIS — Z741 Need for assistance with personal care: Secondary | ICD-10-CM | POA: Diagnosis not present

## 2022-10-23 DIAGNOSIS — R159 Full incontinence of feces: Secondary | ICD-10-CM | POA: Diagnosis not present

## 2022-10-23 DIAGNOSIS — Z515 Encounter for palliative care: Secondary | ICD-10-CM | POA: Diagnosis not present

## 2022-10-23 DIAGNOSIS — M549 Dorsalgia, unspecified: Secondary | ICD-10-CM | POA: Diagnosis not present

## 2022-10-23 DIAGNOSIS — G309 Alzheimer's disease, unspecified: Secondary | ICD-10-CM | POA: Diagnosis not present

## 2022-10-25 ENCOUNTER — Other Ambulatory Visit: Payer: Self-pay | Admitting: Internal Medicine

## 2022-10-27 DIAGNOSIS — G309 Alzheimer's disease, unspecified: Secondary | ICD-10-CM | POA: Diagnosis not present

## 2022-10-27 DIAGNOSIS — M549 Dorsalgia, unspecified: Secondary | ICD-10-CM | POA: Diagnosis not present

## 2022-10-27 DIAGNOSIS — Z741 Need for assistance with personal care: Secondary | ICD-10-CM | POA: Diagnosis not present

## 2022-10-27 DIAGNOSIS — F0280 Dementia in other diseases classified elsewhere without behavioral disturbance: Secondary | ICD-10-CM | POA: Diagnosis not present

## 2022-10-27 DIAGNOSIS — R32 Unspecified urinary incontinence: Secondary | ICD-10-CM | POA: Diagnosis not present

## 2022-10-27 DIAGNOSIS — I5032 Chronic diastolic (congestive) heart failure: Secondary | ICD-10-CM | POA: Diagnosis not present

## 2022-10-28 DIAGNOSIS — M549 Dorsalgia, unspecified: Secondary | ICD-10-CM | POA: Diagnosis not present

## 2022-10-28 DIAGNOSIS — R32 Unspecified urinary incontinence: Secondary | ICD-10-CM | POA: Diagnosis not present

## 2022-10-28 DIAGNOSIS — F0280 Dementia in other diseases classified elsewhere without behavioral disturbance: Secondary | ICD-10-CM | POA: Diagnosis not present

## 2022-10-28 DIAGNOSIS — I5032 Chronic diastolic (congestive) heart failure: Secondary | ICD-10-CM | POA: Diagnosis not present

## 2022-10-28 DIAGNOSIS — G309 Alzheimer's disease, unspecified: Secondary | ICD-10-CM | POA: Diagnosis not present

## 2022-10-28 DIAGNOSIS — Z741 Need for assistance with personal care: Secondary | ICD-10-CM | POA: Diagnosis not present

## 2022-10-29 DIAGNOSIS — Z741 Need for assistance with personal care: Secondary | ICD-10-CM | POA: Diagnosis not present

## 2022-10-29 DIAGNOSIS — R32 Unspecified urinary incontinence: Secondary | ICD-10-CM | POA: Diagnosis not present

## 2022-10-29 DIAGNOSIS — G309 Alzheimer's disease, unspecified: Secondary | ICD-10-CM | POA: Diagnosis not present

## 2022-10-29 DIAGNOSIS — M549 Dorsalgia, unspecified: Secondary | ICD-10-CM | POA: Diagnosis not present

## 2022-10-29 DIAGNOSIS — I5032 Chronic diastolic (congestive) heart failure: Secondary | ICD-10-CM | POA: Diagnosis not present

## 2022-10-29 DIAGNOSIS — F0280 Dementia in other diseases classified elsewhere without behavioral disturbance: Secondary | ICD-10-CM | POA: Diagnosis not present

## 2022-11-03 DIAGNOSIS — M549 Dorsalgia, unspecified: Secondary | ICD-10-CM | POA: Diagnosis not present

## 2022-11-03 DIAGNOSIS — Z741 Need for assistance with personal care: Secondary | ICD-10-CM | POA: Diagnosis not present

## 2022-11-03 DIAGNOSIS — F0280 Dementia in other diseases classified elsewhere without behavioral disturbance: Secondary | ICD-10-CM | POA: Diagnosis not present

## 2022-11-03 DIAGNOSIS — R32 Unspecified urinary incontinence: Secondary | ICD-10-CM | POA: Diagnosis not present

## 2022-11-03 DIAGNOSIS — G309 Alzheimer's disease, unspecified: Secondary | ICD-10-CM | POA: Diagnosis not present

## 2022-11-03 DIAGNOSIS — I5032 Chronic diastolic (congestive) heart failure: Secondary | ICD-10-CM | POA: Diagnosis not present

## 2022-11-05 DIAGNOSIS — I5032 Chronic diastolic (congestive) heart failure: Secondary | ICD-10-CM | POA: Diagnosis not present

## 2022-11-05 DIAGNOSIS — M549 Dorsalgia, unspecified: Secondary | ICD-10-CM | POA: Diagnosis not present

## 2022-11-05 DIAGNOSIS — Z741 Need for assistance with personal care: Secondary | ICD-10-CM | POA: Diagnosis not present

## 2022-11-05 DIAGNOSIS — F0280 Dementia in other diseases classified elsewhere without behavioral disturbance: Secondary | ICD-10-CM | POA: Diagnosis not present

## 2022-11-05 DIAGNOSIS — G309 Alzheimer's disease, unspecified: Secondary | ICD-10-CM | POA: Diagnosis not present

## 2022-11-05 DIAGNOSIS — R32 Unspecified urinary incontinence: Secondary | ICD-10-CM | POA: Diagnosis not present

## 2022-11-09 DIAGNOSIS — F0280 Dementia in other diseases classified elsewhere without behavioral disturbance: Secondary | ICD-10-CM | POA: Diagnosis not present

## 2022-11-09 DIAGNOSIS — M549 Dorsalgia, unspecified: Secondary | ICD-10-CM | POA: Diagnosis not present

## 2022-11-09 DIAGNOSIS — I5032 Chronic diastolic (congestive) heart failure: Secondary | ICD-10-CM | POA: Diagnosis not present

## 2022-11-09 DIAGNOSIS — Z741 Need for assistance with personal care: Secondary | ICD-10-CM | POA: Diagnosis not present

## 2022-11-09 DIAGNOSIS — R32 Unspecified urinary incontinence: Secondary | ICD-10-CM | POA: Diagnosis not present

## 2022-11-09 DIAGNOSIS — G309 Alzheimer's disease, unspecified: Secondary | ICD-10-CM | POA: Diagnosis not present

## 2022-11-10 ENCOUNTER — Other Ambulatory Visit: Payer: Self-pay | Admitting: Internal Medicine

## 2022-11-11 DIAGNOSIS — G309 Alzheimer's disease, unspecified: Secondary | ICD-10-CM | POA: Diagnosis not present

## 2022-11-11 DIAGNOSIS — Z741 Need for assistance with personal care: Secondary | ICD-10-CM | POA: Diagnosis not present

## 2022-11-11 DIAGNOSIS — F0280 Dementia in other diseases classified elsewhere without behavioral disturbance: Secondary | ICD-10-CM | POA: Diagnosis not present

## 2022-11-11 DIAGNOSIS — I5032 Chronic diastolic (congestive) heart failure: Secondary | ICD-10-CM | POA: Diagnosis not present

## 2022-11-11 DIAGNOSIS — M549 Dorsalgia, unspecified: Secondary | ICD-10-CM | POA: Diagnosis not present

## 2022-11-11 DIAGNOSIS — R32 Unspecified urinary incontinence: Secondary | ICD-10-CM | POA: Diagnosis not present

## 2022-11-12 DIAGNOSIS — F0280 Dementia in other diseases classified elsewhere without behavioral disturbance: Secondary | ICD-10-CM | POA: Diagnosis not present

## 2022-11-12 DIAGNOSIS — Z741 Need for assistance with personal care: Secondary | ICD-10-CM | POA: Diagnosis not present

## 2022-11-12 DIAGNOSIS — I5032 Chronic diastolic (congestive) heart failure: Secondary | ICD-10-CM | POA: Diagnosis not present

## 2022-11-12 DIAGNOSIS — M549 Dorsalgia, unspecified: Secondary | ICD-10-CM | POA: Diagnosis not present

## 2022-11-12 DIAGNOSIS — G309 Alzheimer's disease, unspecified: Secondary | ICD-10-CM | POA: Diagnosis not present

## 2022-11-12 DIAGNOSIS — R32 Unspecified urinary incontinence: Secondary | ICD-10-CM | POA: Diagnosis not present

## 2022-11-16 DIAGNOSIS — M549 Dorsalgia, unspecified: Secondary | ICD-10-CM | POA: Diagnosis not present

## 2022-11-16 DIAGNOSIS — F0280 Dementia in other diseases classified elsewhere without behavioral disturbance: Secondary | ICD-10-CM | POA: Diagnosis not present

## 2022-11-16 DIAGNOSIS — Z741 Need for assistance with personal care: Secondary | ICD-10-CM | POA: Diagnosis not present

## 2022-11-16 DIAGNOSIS — G309 Alzheimer's disease, unspecified: Secondary | ICD-10-CM | POA: Diagnosis not present

## 2022-11-16 DIAGNOSIS — R32 Unspecified urinary incontinence: Secondary | ICD-10-CM | POA: Diagnosis not present

## 2022-11-16 DIAGNOSIS — I5032 Chronic diastolic (congestive) heart failure: Secondary | ICD-10-CM | POA: Diagnosis not present

## 2022-11-18 DIAGNOSIS — M549 Dorsalgia, unspecified: Secondary | ICD-10-CM | POA: Diagnosis not present

## 2022-11-18 DIAGNOSIS — Z741 Need for assistance with personal care: Secondary | ICD-10-CM | POA: Diagnosis not present

## 2022-11-18 DIAGNOSIS — F0280 Dementia in other diseases classified elsewhere without behavioral disturbance: Secondary | ICD-10-CM | POA: Diagnosis not present

## 2022-11-18 DIAGNOSIS — I5032 Chronic diastolic (congestive) heart failure: Secondary | ICD-10-CM | POA: Diagnosis not present

## 2022-11-18 DIAGNOSIS — G309 Alzheimer's disease, unspecified: Secondary | ICD-10-CM | POA: Diagnosis not present

## 2022-11-18 DIAGNOSIS — R32 Unspecified urinary incontinence: Secondary | ICD-10-CM | POA: Diagnosis not present

## 2022-11-22 DIAGNOSIS — F0280 Dementia in other diseases classified elsewhere without behavioral disturbance: Secondary | ICD-10-CM | POA: Diagnosis not present

## 2022-11-22 DIAGNOSIS — G309 Alzheimer's disease, unspecified: Secondary | ICD-10-CM | POA: Diagnosis not present

## 2022-11-22 DIAGNOSIS — Z66 Do not resuscitate: Secondary | ICD-10-CM | POA: Diagnosis not present

## 2022-11-22 DIAGNOSIS — R32 Unspecified urinary incontinence: Secondary | ICD-10-CM | POA: Diagnosis not present

## 2022-11-22 DIAGNOSIS — I5032 Chronic diastolic (congestive) heart failure: Secondary | ICD-10-CM | POA: Diagnosis not present

## 2022-11-22 DIAGNOSIS — R159 Full incontinence of feces: Secondary | ICD-10-CM | POA: Diagnosis not present

## 2022-11-22 DIAGNOSIS — M549 Dorsalgia, unspecified: Secondary | ICD-10-CM | POA: Diagnosis not present

## 2022-11-22 DIAGNOSIS — Z741 Need for assistance with personal care: Secondary | ICD-10-CM | POA: Diagnosis not present

## 2022-11-22 DIAGNOSIS — Z515 Encounter for palliative care: Secondary | ICD-10-CM | POA: Diagnosis not present

## 2022-11-24 DIAGNOSIS — Z741 Need for assistance with personal care: Secondary | ICD-10-CM | POA: Diagnosis not present

## 2022-11-24 DIAGNOSIS — M549 Dorsalgia, unspecified: Secondary | ICD-10-CM | POA: Diagnosis not present

## 2022-11-24 DIAGNOSIS — R32 Unspecified urinary incontinence: Secondary | ICD-10-CM | POA: Diagnosis not present

## 2022-11-24 DIAGNOSIS — I5032 Chronic diastolic (congestive) heart failure: Secondary | ICD-10-CM | POA: Diagnosis not present

## 2022-11-24 DIAGNOSIS — F0280 Dementia in other diseases classified elsewhere without behavioral disturbance: Secondary | ICD-10-CM | POA: Diagnosis not present

## 2022-11-24 DIAGNOSIS — G309 Alzheimer's disease, unspecified: Secondary | ICD-10-CM | POA: Diagnosis not present

## 2022-11-26 DIAGNOSIS — F0280 Dementia in other diseases classified elsewhere without behavioral disturbance: Secondary | ICD-10-CM | POA: Diagnosis not present

## 2022-11-26 DIAGNOSIS — R32 Unspecified urinary incontinence: Secondary | ICD-10-CM | POA: Diagnosis not present

## 2022-11-26 DIAGNOSIS — M549 Dorsalgia, unspecified: Secondary | ICD-10-CM | POA: Diagnosis not present

## 2022-11-26 DIAGNOSIS — Z741 Need for assistance with personal care: Secondary | ICD-10-CM | POA: Diagnosis not present

## 2022-11-26 DIAGNOSIS — G309 Alzheimer's disease, unspecified: Secondary | ICD-10-CM | POA: Diagnosis not present

## 2022-11-26 DIAGNOSIS — I5032 Chronic diastolic (congestive) heart failure: Secondary | ICD-10-CM | POA: Diagnosis not present

## 2022-12-01 DIAGNOSIS — G309 Alzheimer's disease, unspecified: Secondary | ICD-10-CM | POA: Diagnosis not present

## 2022-12-01 DIAGNOSIS — M549 Dorsalgia, unspecified: Secondary | ICD-10-CM | POA: Diagnosis not present

## 2022-12-01 DIAGNOSIS — Z741 Need for assistance with personal care: Secondary | ICD-10-CM | POA: Diagnosis not present

## 2022-12-01 DIAGNOSIS — R32 Unspecified urinary incontinence: Secondary | ICD-10-CM | POA: Diagnosis not present

## 2022-12-01 DIAGNOSIS — I5032 Chronic diastolic (congestive) heart failure: Secondary | ICD-10-CM | POA: Diagnosis not present

## 2022-12-01 DIAGNOSIS — F0280 Dementia in other diseases classified elsewhere without behavioral disturbance: Secondary | ICD-10-CM | POA: Diagnosis not present

## 2022-12-02 DIAGNOSIS — Z741 Need for assistance with personal care: Secondary | ICD-10-CM | POA: Diagnosis not present

## 2022-12-02 DIAGNOSIS — R32 Unspecified urinary incontinence: Secondary | ICD-10-CM | POA: Diagnosis not present

## 2022-12-02 DIAGNOSIS — G309 Alzheimer's disease, unspecified: Secondary | ICD-10-CM | POA: Diagnosis not present

## 2022-12-02 DIAGNOSIS — I5032 Chronic diastolic (congestive) heart failure: Secondary | ICD-10-CM | POA: Diagnosis not present

## 2022-12-02 DIAGNOSIS — M549 Dorsalgia, unspecified: Secondary | ICD-10-CM | POA: Diagnosis not present

## 2022-12-02 DIAGNOSIS — F0280 Dementia in other diseases classified elsewhere without behavioral disturbance: Secondary | ICD-10-CM | POA: Diagnosis not present

## 2022-12-03 DIAGNOSIS — F0280 Dementia in other diseases classified elsewhere without behavioral disturbance: Secondary | ICD-10-CM | POA: Diagnosis not present

## 2022-12-03 DIAGNOSIS — R32 Unspecified urinary incontinence: Secondary | ICD-10-CM | POA: Diagnosis not present

## 2022-12-03 DIAGNOSIS — I5032 Chronic diastolic (congestive) heart failure: Secondary | ICD-10-CM | POA: Diagnosis not present

## 2022-12-03 DIAGNOSIS — Z741 Need for assistance with personal care: Secondary | ICD-10-CM | POA: Diagnosis not present

## 2022-12-03 DIAGNOSIS — M549 Dorsalgia, unspecified: Secondary | ICD-10-CM | POA: Diagnosis not present

## 2022-12-03 DIAGNOSIS — G309 Alzheimer's disease, unspecified: Secondary | ICD-10-CM | POA: Diagnosis not present

## 2022-12-09 DIAGNOSIS — G309 Alzheimer's disease, unspecified: Secondary | ICD-10-CM | POA: Diagnosis not present

## 2022-12-09 DIAGNOSIS — Z741 Need for assistance with personal care: Secondary | ICD-10-CM | POA: Diagnosis not present

## 2022-12-09 DIAGNOSIS — I5032 Chronic diastolic (congestive) heart failure: Secondary | ICD-10-CM | POA: Diagnosis not present

## 2022-12-09 DIAGNOSIS — F0280 Dementia in other diseases classified elsewhere without behavioral disturbance: Secondary | ICD-10-CM | POA: Diagnosis not present

## 2022-12-09 DIAGNOSIS — M549 Dorsalgia, unspecified: Secondary | ICD-10-CM | POA: Diagnosis not present

## 2022-12-09 DIAGNOSIS — R32 Unspecified urinary incontinence: Secondary | ICD-10-CM | POA: Diagnosis not present

## 2022-12-10 DIAGNOSIS — G309 Alzheimer's disease, unspecified: Secondary | ICD-10-CM | POA: Diagnosis not present

## 2022-12-10 DIAGNOSIS — I5032 Chronic diastolic (congestive) heart failure: Secondary | ICD-10-CM | POA: Diagnosis not present

## 2022-12-10 DIAGNOSIS — M549 Dorsalgia, unspecified: Secondary | ICD-10-CM | POA: Diagnosis not present

## 2022-12-10 DIAGNOSIS — F0280 Dementia in other diseases classified elsewhere without behavioral disturbance: Secondary | ICD-10-CM | POA: Diagnosis not present

## 2022-12-10 DIAGNOSIS — Z741 Need for assistance with personal care: Secondary | ICD-10-CM | POA: Diagnosis not present

## 2022-12-10 DIAGNOSIS — R32 Unspecified urinary incontinence: Secondary | ICD-10-CM | POA: Diagnosis not present

## 2022-12-16 DIAGNOSIS — G309 Alzheimer's disease, unspecified: Secondary | ICD-10-CM | POA: Diagnosis not present

## 2022-12-16 DIAGNOSIS — M549 Dorsalgia, unspecified: Secondary | ICD-10-CM | POA: Diagnosis not present

## 2022-12-16 DIAGNOSIS — F0280 Dementia in other diseases classified elsewhere without behavioral disturbance: Secondary | ICD-10-CM | POA: Diagnosis not present

## 2022-12-16 DIAGNOSIS — I5032 Chronic diastolic (congestive) heart failure: Secondary | ICD-10-CM | POA: Diagnosis not present

## 2022-12-16 DIAGNOSIS — R32 Unspecified urinary incontinence: Secondary | ICD-10-CM | POA: Diagnosis not present

## 2022-12-16 DIAGNOSIS — Z741 Need for assistance with personal care: Secondary | ICD-10-CM | POA: Diagnosis not present

## 2022-12-17 DIAGNOSIS — F0280 Dementia in other diseases classified elsewhere without behavioral disturbance: Secondary | ICD-10-CM | POA: Diagnosis not present

## 2022-12-17 DIAGNOSIS — G309 Alzheimer's disease, unspecified: Secondary | ICD-10-CM | POA: Diagnosis not present

## 2022-12-17 DIAGNOSIS — M549 Dorsalgia, unspecified: Secondary | ICD-10-CM | POA: Diagnosis not present

## 2022-12-17 DIAGNOSIS — Z741 Need for assistance with personal care: Secondary | ICD-10-CM | POA: Diagnosis not present

## 2022-12-17 DIAGNOSIS — I5032 Chronic diastolic (congestive) heart failure: Secondary | ICD-10-CM | POA: Diagnosis not present

## 2022-12-17 DIAGNOSIS — R32 Unspecified urinary incontinence: Secondary | ICD-10-CM | POA: Diagnosis not present

## 2022-12-18 DIAGNOSIS — R32 Unspecified urinary incontinence: Secondary | ICD-10-CM | POA: Diagnosis not present

## 2022-12-18 DIAGNOSIS — G309 Alzheimer's disease, unspecified: Secondary | ICD-10-CM | POA: Diagnosis not present

## 2022-12-18 DIAGNOSIS — I5032 Chronic diastolic (congestive) heart failure: Secondary | ICD-10-CM | POA: Diagnosis not present

## 2022-12-18 DIAGNOSIS — Z741 Need for assistance with personal care: Secondary | ICD-10-CM | POA: Diagnosis not present

## 2022-12-18 DIAGNOSIS — F0280 Dementia in other diseases classified elsewhere without behavioral disturbance: Secondary | ICD-10-CM | POA: Diagnosis not present

## 2022-12-18 DIAGNOSIS — M549 Dorsalgia, unspecified: Secondary | ICD-10-CM | POA: Diagnosis not present

## 2022-12-22 DIAGNOSIS — R32 Unspecified urinary incontinence: Secondary | ICD-10-CM | POA: Diagnosis not present

## 2022-12-22 DIAGNOSIS — F0280 Dementia in other diseases classified elsewhere without behavioral disturbance: Secondary | ICD-10-CM | POA: Diagnosis not present

## 2022-12-22 DIAGNOSIS — I5032 Chronic diastolic (congestive) heart failure: Secondary | ICD-10-CM | POA: Diagnosis not present

## 2022-12-22 DIAGNOSIS — M549 Dorsalgia, unspecified: Secondary | ICD-10-CM | POA: Diagnosis not present

## 2022-12-22 DIAGNOSIS — G309 Alzheimer's disease, unspecified: Secondary | ICD-10-CM | POA: Diagnosis not present

## 2022-12-22 DIAGNOSIS — Z741 Need for assistance with personal care: Secondary | ICD-10-CM | POA: Diagnosis not present

## 2022-12-23 DIAGNOSIS — Z515 Encounter for palliative care: Secondary | ICD-10-CM | POA: Diagnosis not present

## 2022-12-23 DIAGNOSIS — F0280 Dementia in other diseases classified elsewhere without behavioral disturbance: Secondary | ICD-10-CM | POA: Diagnosis not present

## 2022-12-23 DIAGNOSIS — R159 Full incontinence of feces: Secondary | ICD-10-CM | POA: Diagnosis not present

## 2022-12-23 DIAGNOSIS — Z741 Need for assistance with personal care: Secondary | ICD-10-CM | POA: Diagnosis not present

## 2022-12-23 DIAGNOSIS — I5032 Chronic diastolic (congestive) heart failure: Secondary | ICD-10-CM | POA: Diagnosis not present

## 2022-12-23 DIAGNOSIS — Z66 Do not resuscitate: Secondary | ICD-10-CM | POA: Diagnosis not present

## 2022-12-23 DIAGNOSIS — G309 Alzheimer's disease, unspecified: Secondary | ICD-10-CM | POA: Diagnosis not present

## 2022-12-23 DIAGNOSIS — R32 Unspecified urinary incontinence: Secondary | ICD-10-CM | POA: Diagnosis not present

## 2022-12-23 DIAGNOSIS — M549 Dorsalgia, unspecified: Secondary | ICD-10-CM | POA: Diagnosis not present

## 2022-12-24 DIAGNOSIS — Z741 Need for assistance with personal care: Secondary | ICD-10-CM | POA: Diagnosis not present

## 2022-12-24 DIAGNOSIS — G309 Alzheimer's disease, unspecified: Secondary | ICD-10-CM | POA: Diagnosis not present

## 2022-12-24 DIAGNOSIS — M549 Dorsalgia, unspecified: Secondary | ICD-10-CM | POA: Diagnosis not present

## 2022-12-24 DIAGNOSIS — R32 Unspecified urinary incontinence: Secondary | ICD-10-CM | POA: Diagnosis not present

## 2022-12-24 DIAGNOSIS — F0280 Dementia in other diseases classified elsewhere without behavioral disturbance: Secondary | ICD-10-CM | POA: Diagnosis not present

## 2022-12-24 DIAGNOSIS — I5032 Chronic diastolic (congestive) heart failure: Secondary | ICD-10-CM | POA: Diagnosis not present

## 2022-12-27 ENCOUNTER — Encounter: Payer: Self-pay | Admitting: Podiatry

## 2022-12-27 ENCOUNTER — Ambulatory Visit (INDEPENDENT_AMBULATORY_CARE_PROVIDER_SITE_OTHER): Payer: Medicare Other | Admitting: Podiatry

## 2022-12-27 DIAGNOSIS — M79675 Pain in left toe(s): Secondary | ICD-10-CM | POA: Diagnosis not present

## 2022-12-27 DIAGNOSIS — B351 Tinea unguium: Secondary | ICD-10-CM | POA: Diagnosis not present

## 2022-12-27 DIAGNOSIS — D696 Thrombocytopenia, unspecified: Secondary | ICD-10-CM

## 2022-12-27 DIAGNOSIS — M79674 Pain in right toe(s): Secondary | ICD-10-CM

## 2022-12-27 NOTE — Progress Notes (Signed)
This patient presents  to my office for at risk foot care.  This patient requires this care by a professional since this patient will be at risk due to having thrombocytopenia.  This patient is unable to cut nails herself since the patient cannot reach her nails.These nails are painful walking and wearing shoes.   Patient presents to the office in a wheelchair.  She presents to the office with her caregiver. This patient presents for at risk foot care today.  General Appearance  Alert, conversant and in no acute stress.  Vascular  Dorsalis pedis and posterior tibial  pulses are absent  bilaterally.  Purple feet noted.  Capillary return is within normal limits  bilaterally. Cold feet. bilaterally.  Neurologic  deferred she is wearing compression socks.  Nails Thick disfigured discolored nails with subungual debris  from hallux to fifth toes bilaterally. No evidence of bacterial infection or drainage bilaterally.  Orthopedic  No limitations of motion  feet .  No crepitus or effusions noted.  No bony pathology or digital deformities noted.  Skin  normotropic skin with no porokeratosis noted bilaterally.  No signs of infections or ulcers noted.     Onychomycosis  Pain in right toes  Pain in left toes  Consent was obtained for treatment procedures.   Mechanical debridement of nails 1-5  bilaterally performed with a nail nipper.  Filed with dremel without incident.    Return office visit    prn                Told patient to return for periodic foot care and evaluation due to potential at risk complications.   Helane Gunther DPM

## 2022-12-29 DIAGNOSIS — Z741 Need for assistance with personal care: Secondary | ICD-10-CM | POA: Diagnosis not present

## 2022-12-29 DIAGNOSIS — I5032 Chronic diastolic (congestive) heart failure: Secondary | ICD-10-CM | POA: Diagnosis not present

## 2022-12-29 DIAGNOSIS — F0280 Dementia in other diseases classified elsewhere without behavioral disturbance: Secondary | ICD-10-CM | POA: Diagnosis not present

## 2022-12-29 DIAGNOSIS — G309 Alzheimer's disease, unspecified: Secondary | ICD-10-CM | POA: Diagnosis not present

## 2022-12-29 DIAGNOSIS — R32 Unspecified urinary incontinence: Secondary | ICD-10-CM | POA: Diagnosis not present

## 2022-12-29 DIAGNOSIS — M549 Dorsalgia, unspecified: Secondary | ICD-10-CM | POA: Diagnosis not present

## 2023-01-03 DIAGNOSIS — M549 Dorsalgia, unspecified: Secondary | ICD-10-CM | POA: Diagnosis not present

## 2023-01-03 DIAGNOSIS — I5032 Chronic diastolic (congestive) heart failure: Secondary | ICD-10-CM | POA: Diagnosis not present

## 2023-01-03 DIAGNOSIS — G309 Alzheimer's disease, unspecified: Secondary | ICD-10-CM | POA: Diagnosis not present

## 2023-01-03 DIAGNOSIS — R32 Unspecified urinary incontinence: Secondary | ICD-10-CM | POA: Diagnosis not present

## 2023-01-03 DIAGNOSIS — Z741 Need for assistance with personal care: Secondary | ICD-10-CM | POA: Diagnosis not present

## 2023-01-03 DIAGNOSIS — F0280 Dementia in other diseases classified elsewhere without behavioral disturbance: Secondary | ICD-10-CM | POA: Diagnosis not present

## 2023-01-06 DIAGNOSIS — G309 Alzheimer's disease, unspecified: Secondary | ICD-10-CM | POA: Diagnosis not present

## 2023-01-06 DIAGNOSIS — I5032 Chronic diastolic (congestive) heart failure: Secondary | ICD-10-CM | POA: Diagnosis not present

## 2023-01-06 DIAGNOSIS — F0280 Dementia in other diseases classified elsewhere without behavioral disturbance: Secondary | ICD-10-CM | POA: Diagnosis not present

## 2023-01-06 DIAGNOSIS — M549 Dorsalgia, unspecified: Secondary | ICD-10-CM | POA: Diagnosis not present

## 2023-01-06 DIAGNOSIS — R32 Unspecified urinary incontinence: Secondary | ICD-10-CM | POA: Diagnosis not present

## 2023-01-06 DIAGNOSIS — Z741 Need for assistance with personal care: Secondary | ICD-10-CM | POA: Diagnosis not present

## 2023-01-07 DIAGNOSIS — R32 Unspecified urinary incontinence: Secondary | ICD-10-CM | POA: Diagnosis not present

## 2023-01-07 DIAGNOSIS — I5032 Chronic diastolic (congestive) heart failure: Secondary | ICD-10-CM | POA: Diagnosis not present

## 2023-01-07 DIAGNOSIS — G309 Alzheimer's disease, unspecified: Secondary | ICD-10-CM | POA: Diagnosis not present

## 2023-01-07 DIAGNOSIS — Z741 Need for assistance with personal care: Secondary | ICD-10-CM | POA: Diagnosis not present

## 2023-01-07 DIAGNOSIS — F0280 Dementia in other diseases classified elsewhere without behavioral disturbance: Secondary | ICD-10-CM | POA: Diagnosis not present

## 2023-01-07 DIAGNOSIS — M549 Dorsalgia, unspecified: Secondary | ICD-10-CM | POA: Diagnosis not present

## 2023-01-12 DIAGNOSIS — G309 Alzheimer's disease, unspecified: Secondary | ICD-10-CM | POA: Diagnosis not present

## 2023-01-12 DIAGNOSIS — F0280 Dementia in other diseases classified elsewhere without behavioral disturbance: Secondary | ICD-10-CM | POA: Diagnosis not present

## 2023-01-12 DIAGNOSIS — R32 Unspecified urinary incontinence: Secondary | ICD-10-CM | POA: Diagnosis not present

## 2023-01-12 DIAGNOSIS — Z741 Need for assistance with personal care: Secondary | ICD-10-CM | POA: Diagnosis not present

## 2023-01-12 DIAGNOSIS — I5032 Chronic diastolic (congestive) heart failure: Secondary | ICD-10-CM | POA: Diagnosis not present

## 2023-01-12 DIAGNOSIS — M549 Dorsalgia, unspecified: Secondary | ICD-10-CM | POA: Diagnosis not present

## 2023-01-13 DIAGNOSIS — F0280 Dementia in other diseases classified elsewhere without behavioral disturbance: Secondary | ICD-10-CM | POA: Diagnosis not present

## 2023-01-13 DIAGNOSIS — I5032 Chronic diastolic (congestive) heart failure: Secondary | ICD-10-CM | POA: Diagnosis not present

## 2023-01-13 DIAGNOSIS — R32 Unspecified urinary incontinence: Secondary | ICD-10-CM | POA: Diagnosis not present

## 2023-01-13 DIAGNOSIS — M549 Dorsalgia, unspecified: Secondary | ICD-10-CM | POA: Diagnosis not present

## 2023-01-13 DIAGNOSIS — G309 Alzheimer's disease, unspecified: Secondary | ICD-10-CM | POA: Diagnosis not present

## 2023-01-13 DIAGNOSIS — Z741 Need for assistance with personal care: Secondary | ICD-10-CM | POA: Diagnosis not present

## 2023-01-14 DIAGNOSIS — F0280 Dementia in other diseases classified elsewhere without behavioral disturbance: Secondary | ICD-10-CM | POA: Diagnosis not present

## 2023-01-14 DIAGNOSIS — G309 Alzheimer's disease, unspecified: Secondary | ICD-10-CM | POA: Diagnosis not present

## 2023-01-14 DIAGNOSIS — I5032 Chronic diastolic (congestive) heart failure: Secondary | ICD-10-CM | POA: Diagnosis not present

## 2023-01-14 DIAGNOSIS — R32 Unspecified urinary incontinence: Secondary | ICD-10-CM | POA: Diagnosis not present

## 2023-01-14 DIAGNOSIS — M549 Dorsalgia, unspecified: Secondary | ICD-10-CM | POA: Diagnosis not present

## 2023-01-14 DIAGNOSIS — Z741 Need for assistance with personal care: Secondary | ICD-10-CM | POA: Diagnosis not present

## 2023-01-20 DIAGNOSIS — G309 Alzheimer's disease, unspecified: Secondary | ICD-10-CM | POA: Diagnosis not present

## 2023-01-20 DIAGNOSIS — M549 Dorsalgia, unspecified: Secondary | ICD-10-CM | POA: Diagnosis not present

## 2023-01-20 DIAGNOSIS — I5032 Chronic diastolic (congestive) heart failure: Secondary | ICD-10-CM | POA: Diagnosis not present

## 2023-01-20 DIAGNOSIS — Z741 Need for assistance with personal care: Secondary | ICD-10-CM | POA: Diagnosis not present

## 2023-01-20 DIAGNOSIS — F0280 Dementia in other diseases classified elsewhere without behavioral disturbance: Secondary | ICD-10-CM | POA: Diagnosis not present

## 2023-01-20 DIAGNOSIS — R32 Unspecified urinary incontinence: Secondary | ICD-10-CM | POA: Diagnosis not present

## 2023-01-21 DIAGNOSIS — M549 Dorsalgia, unspecified: Secondary | ICD-10-CM | POA: Diagnosis not present

## 2023-01-21 DIAGNOSIS — G309 Alzheimer's disease, unspecified: Secondary | ICD-10-CM | POA: Diagnosis not present

## 2023-01-21 DIAGNOSIS — I5032 Chronic diastolic (congestive) heart failure: Secondary | ICD-10-CM | POA: Diagnosis not present

## 2023-01-21 DIAGNOSIS — F0280 Dementia in other diseases classified elsewhere without behavioral disturbance: Secondary | ICD-10-CM | POA: Diagnosis not present

## 2023-01-21 DIAGNOSIS — R32 Unspecified urinary incontinence: Secondary | ICD-10-CM | POA: Diagnosis not present

## 2023-01-21 DIAGNOSIS — Z741 Need for assistance with personal care: Secondary | ICD-10-CM | POA: Diagnosis not present

## 2023-01-23 DIAGNOSIS — I5032 Chronic diastolic (congestive) heart failure: Secondary | ICD-10-CM | POA: Diagnosis not present

## 2023-01-23 DIAGNOSIS — F0280 Dementia in other diseases classified elsewhere without behavioral disturbance: Secondary | ICD-10-CM | POA: Diagnosis not present

## 2023-01-23 DIAGNOSIS — Z515 Encounter for palliative care: Secondary | ICD-10-CM | POA: Diagnosis not present

## 2023-01-23 DIAGNOSIS — Z66 Do not resuscitate: Secondary | ICD-10-CM | POA: Diagnosis not present

## 2023-01-23 DIAGNOSIS — Z741 Need for assistance with personal care: Secondary | ICD-10-CM | POA: Diagnosis not present

## 2023-01-23 DIAGNOSIS — R32 Unspecified urinary incontinence: Secondary | ICD-10-CM | POA: Diagnosis not present

## 2023-01-23 DIAGNOSIS — G309 Alzheimer's disease, unspecified: Secondary | ICD-10-CM | POA: Diagnosis not present

## 2023-01-23 DIAGNOSIS — M549 Dorsalgia, unspecified: Secondary | ICD-10-CM | POA: Diagnosis not present

## 2023-01-23 DIAGNOSIS — R159 Full incontinence of feces: Secondary | ICD-10-CM | POA: Diagnosis not present

## 2023-01-25 DIAGNOSIS — M549 Dorsalgia, unspecified: Secondary | ICD-10-CM | POA: Diagnosis not present

## 2023-01-25 DIAGNOSIS — Z741 Need for assistance with personal care: Secondary | ICD-10-CM | POA: Diagnosis not present

## 2023-01-25 DIAGNOSIS — F0280 Dementia in other diseases classified elsewhere without behavioral disturbance: Secondary | ICD-10-CM | POA: Diagnosis not present

## 2023-01-25 DIAGNOSIS — R32 Unspecified urinary incontinence: Secondary | ICD-10-CM | POA: Diagnosis not present

## 2023-01-25 DIAGNOSIS — G309 Alzheimer's disease, unspecified: Secondary | ICD-10-CM | POA: Diagnosis not present

## 2023-01-25 DIAGNOSIS — I5032 Chronic diastolic (congestive) heart failure: Secondary | ICD-10-CM | POA: Diagnosis not present

## 2023-01-27 DIAGNOSIS — G309 Alzheimer's disease, unspecified: Secondary | ICD-10-CM | POA: Diagnosis not present

## 2023-01-27 DIAGNOSIS — I5032 Chronic diastolic (congestive) heart failure: Secondary | ICD-10-CM | POA: Diagnosis not present

## 2023-01-27 DIAGNOSIS — M549 Dorsalgia, unspecified: Secondary | ICD-10-CM | POA: Diagnosis not present

## 2023-01-27 DIAGNOSIS — F0280 Dementia in other diseases classified elsewhere without behavioral disturbance: Secondary | ICD-10-CM | POA: Diagnosis not present

## 2023-01-27 DIAGNOSIS — Z741 Need for assistance with personal care: Secondary | ICD-10-CM | POA: Diagnosis not present

## 2023-01-27 DIAGNOSIS — R32 Unspecified urinary incontinence: Secondary | ICD-10-CM | POA: Diagnosis not present

## 2023-01-28 DIAGNOSIS — I5032 Chronic diastolic (congestive) heart failure: Secondary | ICD-10-CM | POA: Diagnosis not present

## 2023-01-28 DIAGNOSIS — G309 Alzheimer's disease, unspecified: Secondary | ICD-10-CM | POA: Diagnosis not present

## 2023-01-28 DIAGNOSIS — M549 Dorsalgia, unspecified: Secondary | ICD-10-CM | POA: Diagnosis not present

## 2023-01-28 DIAGNOSIS — R32 Unspecified urinary incontinence: Secondary | ICD-10-CM | POA: Diagnosis not present

## 2023-01-28 DIAGNOSIS — Z741 Need for assistance with personal care: Secondary | ICD-10-CM | POA: Diagnosis not present

## 2023-01-28 DIAGNOSIS — F0280 Dementia in other diseases classified elsewhere without behavioral disturbance: Secondary | ICD-10-CM | POA: Diagnosis not present

## 2023-02-02 DIAGNOSIS — R32 Unspecified urinary incontinence: Secondary | ICD-10-CM | POA: Diagnosis not present

## 2023-02-02 DIAGNOSIS — G309 Alzheimer's disease, unspecified: Secondary | ICD-10-CM | POA: Diagnosis not present

## 2023-02-02 DIAGNOSIS — Z741 Need for assistance with personal care: Secondary | ICD-10-CM | POA: Diagnosis not present

## 2023-02-02 DIAGNOSIS — M549 Dorsalgia, unspecified: Secondary | ICD-10-CM | POA: Diagnosis not present

## 2023-02-02 DIAGNOSIS — I5032 Chronic diastolic (congestive) heart failure: Secondary | ICD-10-CM | POA: Diagnosis not present

## 2023-02-02 DIAGNOSIS — F0280 Dementia in other diseases classified elsewhere without behavioral disturbance: Secondary | ICD-10-CM | POA: Diagnosis not present

## 2023-02-03 DIAGNOSIS — I5032 Chronic diastolic (congestive) heart failure: Secondary | ICD-10-CM | POA: Diagnosis not present

## 2023-02-03 DIAGNOSIS — R32 Unspecified urinary incontinence: Secondary | ICD-10-CM | POA: Diagnosis not present

## 2023-02-03 DIAGNOSIS — F0280 Dementia in other diseases classified elsewhere without behavioral disturbance: Secondary | ICD-10-CM | POA: Diagnosis not present

## 2023-02-03 DIAGNOSIS — Z741 Need for assistance with personal care: Secondary | ICD-10-CM | POA: Diagnosis not present

## 2023-02-03 DIAGNOSIS — M549 Dorsalgia, unspecified: Secondary | ICD-10-CM | POA: Diagnosis not present

## 2023-02-03 DIAGNOSIS — G309 Alzheimer's disease, unspecified: Secondary | ICD-10-CM | POA: Diagnosis not present

## 2023-02-04 DIAGNOSIS — M549 Dorsalgia, unspecified: Secondary | ICD-10-CM | POA: Diagnosis not present

## 2023-02-04 DIAGNOSIS — G309 Alzheimer's disease, unspecified: Secondary | ICD-10-CM | POA: Diagnosis not present

## 2023-02-04 DIAGNOSIS — F0280 Dementia in other diseases classified elsewhere without behavioral disturbance: Secondary | ICD-10-CM | POA: Diagnosis not present

## 2023-02-04 DIAGNOSIS — R32 Unspecified urinary incontinence: Secondary | ICD-10-CM | POA: Diagnosis not present

## 2023-02-04 DIAGNOSIS — I5032 Chronic diastolic (congestive) heart failure: Secondary | ICD-10-CM | POA: Diagnosis not present

## 2023-02-04 DIAGNOSIS — Z741 Need for assistance with personal care: Secondary | ICD-10-CM | POA: Diagnosis not present

## 2023-02-10 DIAGNOSIS — M549 Dorsalgia, unspecified: Secondary | ICD-10-CM | POA: Diagnosis not present

## 2023-02-10 DIAGNOSIS — I5032 Chronic diastolic (congestive) heart failure: Secondary | ICD-10-CM | POA: Diagnosis not present

## 2023-02-10 DIAGNOSIS — Z741 Need for assistance with personal care: Secondary | ICD-10-CM | POA: Diagnosis not present

## 2023-02-10 DIAGNOSIS — F0280 Dementia in other diseases classified elsewhere without behavioral disturbance: Secondary | ICD-10-CM | POA: Diagnosis not present

## 2023-02-10 DIAGNOSIS — G309 Alzheimer's disease, unspecified: Secondary | ICD-10-CM | POA: Diagnosis not present

## 2023-02-10 DIAGNOSIS — R32 Unspecified urinary incontinence: Secondary | ICD-10-CM | POA: Diagnosis not present

## 2023-02-11 DIAGNOSIS — I5032 Chronic diastolic (congestive) heart failure: Secondary | ICD-10-CM | POA: Diagnosis not present

## 2023-02-11 DIAGNOSIS — M549 Dorsalgia, unspecified: Secondary | ICD-10-CM | POA: Diagnosis not present

## 2023-02-11 DIAGNOSIS — F0280 Dementia in other diseases classified elsewhere without behavioral disturbance: Secondary | ICD-10-CM | POA: Diagnosis not present

## 2023-02-11 DIAGNOSIS — G309 Alzheimer's disease, unspecified: Secondary | ICD-10-CM | POA: Diagnosis not present

## 2023-02-11 DIAGNOSIS — R32 Unspecified urinary incontinence: Secondary | ICD-10-CM | POA: Diagnosis not present

## 2023-02-11 DIAGNOSIS — Z741 Need for assistance with personal care: Secondary | ICD-10-CM | POA: Diagnosis not present

## 2023-02-17 DIAGNOSIS — M549 Dorsalgia, unspecified: Secondary | ICD-10-CM | POA: Diagnosis not present

## 2023-02-17 DIAGNOSIS — F0280 Dementia in other diseases classified elsewhere without behavioral disturbance: Secondary | ICD-10-CM | POA: Diagnosis not present

## 2023-02-17 DIAGNOSIS — Z741 Need for assistance with personal care: Secondary | ICD-10-CM | POA: Diagnosis not present

## 2023-02-17 DIAGNOSIS — G309 Alzheimer's disease, unspecified: Secondary | ICD-10-CM | POA: Diagnosis not present

## 2023-02-17 DIAGNOSIS — I5032 Chronic diastolic (congestive) heart failure: Secondary | ICD-10-CM | POA: Diagnosis not present

## 2023-02-17 DIAGNOSIS — R32 Unspecified urinary incontinence: Secondary | ICD-10-CM | POA: Diagnosis not present

## 2023-02-18 DIAGNOSIS — R32 Unspecified urinary incontinence: Secondary | ICD-10-CM | POA: Diagnosis not present

## 2023-02-18 DIAGNOSIS — M549 Dorsalgia, unspecified: Secondary | ICD-10-CM | POA: Diagnosis not present

## 2023-02-18 DIAGNOSIS — Z741 Need for assistance with personal care: Secondary | ICD-10-CM | POA: Diagnosis not present

## 2023-02-18 DIAGNOSIS — G309 Alzheimer's disease, unspecified: Secondary | ICD-10-CM | POA: Diagnosis not present

## 2023-02-18 DIAGNOSIS — F0280 Dementia in other diseases classified elsewhere without behavioral disturbance: Secondary | ICD-10-CM | POA: Diagnosis not present

## 2023-02-18 DIAGNOSIS — I5032 Chronic diastolic (congestive) heart failure: Secondary | ICD-10-CM | POA: Diagnosis not present

## 2023-02-22 DIAGNOSIS — I5032 Chronic diastolic (congestive) heart failure: Secondary | ICD-10-CM | POA: Diagnosis not present

## 2023-02-22 DIAGNOSIS — Z66 Do not resuscitate: Secondary | ICD-10-CM | POA: Diagnosis not present

## 2023-02-22 DIAGNOSIS — R32 Unspecified urinary incontinence: Secondary | ICD-10-CM | POA: Diagnosis not present

## 2023-02-22 DIAGNOSIS — F0280 Dementia in other diseases classified elsewhere without behavioral disturbance: Secondary | ICD-10-CM | POA: Diagnosis not present

## 2023-02-22 DIAGNOSIS — M549 Dorsalgia, unspecified: Secondary | ICD-10-CM | POA: Diagnosis not present

## 2023-02-22 DIAGNOSIS — Z741 Need for assistance with personal care: Secondary | ICD-10-CM | POA: Diagnosis not present

## 2023-02-22 DIAGNOSIS — Z515 Encounter for palliative care: Secondary | ICD-10-CM | POA: Diagnosis not present

## 2023-02-22 DIAGNOSIS — G309 Alzheimer's disease, unspecified: Secondary | ICD-10-CM | POA: Diagnosis not present

## 2023-02-22 DIAGNOSIS — R159 Full incontinence of feces: Secondary | ICD-10-CM | POA: Diagnosis not present

## 2023-02-24 DIAGNOSIS — R32 Unspecified urinary incontinence: Secondary | ICD-10-CM | POA: Diagnosis not present

## 2023-02-24 DIAGNOSIS — I5032 Chronic diastolic (congestive) heart failure: Secondary | ICD-10-CM | POA: Diagnosis not present

## 2023-02-24 DIAGNOSIS — F0280 Dementia in other diseases classified elsewhere without behavioral disturbance: Secondary | ICD-10-CM | POA: Diagnosis not present

## 2023-02-24 DIAGNOSIS — Z741 Need for assistance with personal care: Secondary | ICD-10-CM | POA: Diagnosis not present

## 2023-02-24 DIAGNOSIS — M549 Dorsalgia, unspecified: Secondary | ICD-10-CM | POA: Diagnosis not present

## 2023-02-24 DIAGNOSIS — G309 Alzheimer's disease, unspecified: Secondary | ICD-10-CM | POA: Diagnosis not present

## 2023-02-25 DIAGNOSIS — I5032 Chronic diastolic (congestive) heart failure: Secondary | ICD-10-CM | POA: Diagnosis not present

## 2023-02-25 DIAGNOSIS — F0280 Dementia in other diseases classified elsewhere without behavioral disturbance: Secondary | ICD-10-CM | POA: Diagnosis not present

## 2023-02-25 DIAGNOSIS — Z741 Need for assistance with personal care: Secondary | ICD-10-CM | POA: Diagnosis not present

## 2023-02-25 DIAGNOSIS — G309 Alzheimer's disease, unspecified: Secondary | ICD-10-CM | POA: Diagnosis not present

## 2023-02-25 DIAGNOSIS — R32 Unspecified urinary incontinence: Secondary | ICD-10-CM | POA: Diagnosis not present

## 2023-02-25 DIAGNOSIS — M549 Dorsalgia, unspecified: Secondary | ICD-10-CM | POA: Diagnosis not present

## 2023-03-03 DIAGNOSIS — F0280 Dementia in other diseases classified elsewhere without behavioral disturbance: Secondary | ICD-10-CM | POA: Diagnosis not present

## 2023-03-03 DIAGNOSIS — G309 Alzheimer's disease, unspecified: Secondary | ICD-10-CM | POA: Diagnosis not present

## 2023-03-03 DIAGNOSIS — R32 Unspecified urinary incontinence: Secondary | ICD-10-CM | POA: Diagnosis not present

## 2023-03-03 DIAGNOSIS — I5032 Chronic diastolic (congestive) heart failure: Secondary | ICD-10-CM | POA: Diagnosis not present

## 2023-03-03 DIAGNOSIS — Z741 Need for assistance with personal care: Secondary | ICD-10-CM | POA: Diagnosis not present

## 2023-03-03 DIAGNOSIS — M549 Dorsalgia, unspecified: Secondary | ICD-10-CM | POA: Diagnosis not present

## 2023-03-04 DIAGNOSIS — R32 Unspecified urinary incontinence: Secondary | ICD-10-CM | POA: Diagnosis not present

## 2023-03-04 DIAGNOSIS — G309 Alzheimer's disease, unspecified: Secondary | ICD-10-CM | POA: Diagnosis not present

## 2023-03-04 DIAGNOSIS — I5032 Chronic diastolic (congestive) heart failure: Secondary | ICD-10-CM | POA: Diagnosis not present

## 2023-03-04 DIAGNOSIS — M549 Dorsalgia, unspecified: Secondary | ICD-10-CM | POA: Diagnosis not present

## 2023-03-04 DIAGNOSIS — F0280 Dementia in other diseases classified elsewhere without behavioral disturbance: Secondary | ICD-10-CM | POA: Diagnosis not present

## 2023-03-04 DIAGNOSIS — Z741 Need for assistance with personal care: Secondary | ICD-10-CM | POA: Diagnosis not present

## 2023-03-10 DIAGNOSIS — I5032 Chronic diastolic (congestive) heart failure: Secondary | ICD-10-CM | POA: Diagnosis not present

## 2023-03-10 DIAGNOSIS — Z741 Need for assistance with personal care: Secondary | ICD-10-CM | POA: Diagnosis not present

## 2023-03-10 DIAGNOSIS — M549 Dorsalgia, unspecified: Secondary | ICD-10-CM | POA: Diagnosis not present

## 2023-03-10 DIAGNOSIS — F0280 Dementia in other diseases classified elsewhere without behavioral disturbance: Secondary | ICD-10-CM | POA: Diagnosis not present

## 2023-03-10 DIAGNOSIS — G309 Alzheimer's disease, unspecified: Secondary | ICD-10-CM | POA: Diagnosis not present

## 2023-03-10 DIAGNOSIS — R32 Unspecified urinary incontinence: Secondary | ICD-10-CM | POA: Diagnosis not present

## 2023-03-11 DIAGNOSIS — Z741 Need for assistance with personal care: Secondary | ICD-10-CM | POA: Diagnosis not present

## 2023-03-11 DIAGNOSIS — G309 Alzheimer's disease, unspecified: Secondary | ICD-10-CM | POA: Diagnosis not present

## 2023-03-11 DIAGNOSIS — M549 Dorsalgia, unspecified: Secondary | ICD-10-CM | POA: Diagnosis not present

## 2023-03-11 DIAGNOSIS — I5032 Chronic diastolic (congestive) heart failure: Secondary | ICD-10-CM | POA: Diagnosis not present

## 2023-03-11 DIAGNOSIS — R32 Unspecified urinary incontinence: Secondary | ICD-10-CM | POA: Diagnosis not present

## 2023-03-11 DIAGNOSIS — F0280 Dementia in other diseases classified elsewhere without behavioral disturbance: Secondary | ICD-10-CM | POA: Diagnosis not present

## 2023-03-16 DIAGNOSIS — F0280 Dementia in other diseases classified elsewhere without behavioral disturbance: Secondary | ICD-10-CM | POA: Diagnosis not present

## 2023-03-16 DIAGNOSIS — G309 Alzheimer's disease, unspecified: Secondary | ICD-10-CM | POA: Diagnosis not present

## 2023-03-16 DIAGNOSIS — Z741 Need for assistance with personal care: Secondary | ICD-10-CM | POA: Diagnosis not present

## 2023-03-16 DIAGNOSIS — R32 Unspecified urinary incontinence: Secondary | ICD-10-CM | POA: Diagnosis not present

## 2023-03-16 DIAGNOSIS — M549 Dorsalgia, unspecified: Secondary | ICD-10-CM | POA: Diagnosis not present

## 2023-03-16 DIAGNOSIS — I5032 Chronic diastolic (congestive) heart failure: Secondary | ICD-10-CM | POA: Diagnosis not present

## 2023-03-17 DIAGNOSIS — R32 Unspecified urinary incontinence: Secondary | ICD-10-CM | POA: Diagnosis not present

## 2023-03-17 DIAGNOSIS — Z741 Need for assistance with personal care: Secondary | ICD-10-CM | POA: Diagnosis not present

## 2023-03-17 DIAGNOSIS — I5032 Chronic diastolic (congestive) heart failure: Secondary | ICD-10-CM | POA: Diagnosis not present

## 2023-03-17 DIAGNOSIS — F0280 Dementia in other diseases classified elsewhere without behavioral disturbance: Secondary | ICD-10-CM | POA: Diagnosis not present

## 2023-03-17 DIAGNOSIS — M549 Dorsalgia, unspecified: Secondary | ICD-10-CM | POA: Diagnosis not present

## 2023-03-17 DIAGNOSIS — G309 Alzheimer's disease, unspecified: Secondary | ICD-10-CM | POA: Diagnosis not present

## 2023-03-18 DIAGNOSIS — R32 Unspecified urinary incontinence: Secondary | ICD-10-CM | POA: Diagnosis not present

## 2023-03-18 DIAGNOSIS — I5032 Chronic diastolic (congestive) heart failure: Secondary | ICD-10-CM | POA: Diagnosis not present

## 2023-03-18 DIAGNOSIS — M549 Dorsalgia, unspecified: Secondary | ICD-10-CM | POA: Diagnosis not present

## 2023-03-18 DIAGNOSIS — F0280 Dementia in other diseases classified elsewhere without behavioral disturbance: Secondary | ICD-10-CM | POA: Diagnosis not present

## 2023-03-18 DIAGNOSIS — Z741 Need for assistance with personal care: Secondary | ICD-10-CM | POA: Diagnosis not present

## 2023-03-18 DIAGNOSIS — G309 Alzheimer's disease, unspecified: Secondary | ICD-10-CM | POA: Diagnosis not present

## 2023-03-22 DIAGNOSIS — I5032 Chronic diastolic (congestive) heart failure: Secondary | ICD-10-CM | POA: Diagnosis not present

## 2023-03-22 DIAGNOSIS — G309 Alzheimer's disease, unspecified: Secondary | ICD-10-CM | POA: Diagnosis not present

## 2023-03-22 DIAGNOSIS — Z741 Need for assistance with personal care: Secondary | ICD-10-CM | POA: Diagnosis not present

## 2023-03-22 DIAGNOSIS — R32 Unspecified urinary incontinence: Secondary | ICD-10-CM | POA: Diagnosis not present

## 2023-03-22 DIAGNOSIS — M549 Dorsalgia, unspecified: Secondary | ICD-10-CM | POA: Diagnosis not present

## 2023-03-22 DIAGNOSIS — F0280 Dementia in other diseases classified elsewhere without behavioral disturbance: Secondary | ICD-10-CM | POA: Diagnosis not present

## 2023-03-23 DIAGNOSIS — R32 Unspecified urinary incontinence: Secondary | ICD-10-CM | POA: Diagnosis not present

## 2023-03-23 DIAGNOSIS — Z741 Need for assistance with personal care: Secondary | ICD-10-CM | POA: Diagnosis not present

## 2023-03-23 DIAGNOSIS — I5032 Chronic diastolic (congestive) heart failure: Secondary | ICD-10-CM | POA: Diagnosis not present

## 2023-03-23 DIAGNOSIS — G309 Alzheimer's disease, unspecified: Secondary | ICD-10-CM | POA: Diagnosis not present

## 2023-03-23 DIAGNOSIS — F0280 Dementia in other diseases classified elsewhere without behavioral disturbance: Secondary | ICD-10-CM | POA: Diagnosis not present

## 2023-03-23 DIAGNOSIS — M549 Dorsalgia, unspecified: Secondary | ICD-10-CM | POA: Diagnosis not present

## 2023-03-24 DIAGNOSIS — I5032 Chronic diastolic (congestive) heart failure: Secondary | ICD-10-CM | POA: Diagnosis not present

## 2023-03-24 DIAGNOSIS — G309 Alzheimer's disease, unspecified: Secondary | ICD-10-CM | POA: Diagnosis not present

## 2023-03-24 DIAGNOSIS — Z741 Need for assistance with personal care: Secondary | ICD-10-CM | POA: Diagnosis not present

## 2023-03-24 DIAGNOSIS — R32 Unspecified urinary incontinence: Secondary | ICD-10-CM | POA: Diagnosis not present

## 2023-03-24 DIAGNOSIS — F0280 Dementia in other diseases classified elsewhere without behavioral disturbance: Secondary | ICD-10-CM | POA: Diagnosis not present

## 2023-03-24 DIAGNOSIS — M549 Dorsalgia, unspecified: Secondary | ICD-10-CM | POA: Diagnosis not present

## 2023-03-25 DIAGNOSIS — Z66 Do not resuscitate: Secondary | ICD-10-CM | POA: Diagnosis not present

## 2023-03-25 DIAGNOSIS — Z741 Need for assistance with personal care: Secondary | ICD-10-CM | POA: Diagnosis not present

## 2023-03-25 DIAGNOSIS — M549 Dorsalgia, unspecified: Secondary | ICD-10-CM | POA: Diagnosis not present

## 2023-03-25 DIAGNOSIS — F0280 Dementia in other diseases classified elsewhere without behavioral disturbance: Secondary | ICD-10-CM | POA: Diagnosis not present

## 2023-03-25 DIAGNOSIS — G309 Alzheimer's disease, unspecified: Secondary | ICD-10-CM | POA: Diagnosis not present

## 2023-03-25 DIAGNOSIS — I5032 Chronic diastolic (congestive) heart failure: Secondary | ICD-10-CM | POA: Diagnosis not present

## 2023-03-25 DIAGNOSIS — Z515 Encounter for palliative care: Secondary | ICD-10-CM | POA: Diagnosis not present

## 2023-03-25 DIAGNOSIS — R159 Full incontinence of feces: Secondary | ICD-10-CM | POA: Diagnosis not present

## 2023-03-25 DIAGNOSIS — R32 Unspecified urinary incontinence: Secondary | ICD-10-CM | POA: Diagnosis not present

## 2023-03-29 DIAGNOSIS — G309 Alzheimer's disease, unspecified: Secondary | ICD-10-CM | POA: Diagnosis not present

## 2023-03-29 DIAGNOSIS — R32 Unspecified urinary incontinence: Secondary | ICD-10-CM | POA: Diagnosis not present

## 2023-03-29 DIAGNOSIS — Z741 Need for assistance with personal care: Secondary | ICD-10-CM | POA: Diagnosis not present

## 2023-03-29 DIAGNOSIS — I5032 Chronic diastolic (congestive) heart failure: Secondary | ICD-10-CM | POA: Diagnosis not present

## 2023-03-29 DIAGNOSIS — M549 Dorsalgia, unspecified: Secondary | ICD-10-CM | POA: Diagnosis not present

## 2023-03-29 DIAGNOSIS — F0280 Dementia in other diseases classified elsewhere without behavioral disturbance: Secondary | ICD-10-CM | POA: Diagnosis not present

## 2023-03-31 DIAGNOSIS — Z741 Need for assistance with personal care: Secondary | ICD-10-CM | POA: Diagnosis not present

## 2023-03-31 DIAGNOSIS — G309 Alzheimer's disease, unspecified: Secondary | ICD-10-CM | POA: Diagnosis not present

## 2023-03-31 DIAGNOSIS — R32 Unspecified urinary incontinence: Secondary | ICD-10-CM | POA: Diagnosis not present

## 2023-03-31 DIAGNOSIS — I5032 Chronic diastolic (congestive) heart failure: Secondary | ICD-10-CM | POA: Diagnosis not present

## 2023-03-31 DIAGNOSIS — F0280 Dementia in other diseases classified elsewhere without behavioral disturbance: Secondary | ICD-10-CM | POA: Diagnosis not present

## 2023-03-31 DIAGNOSIS — M549 Dorsalgia, unspecified: Secondary | ICD-10-CM | POA: Diagnosis not present

## 2023-04-01 DIAGNOSIS — R32 Unspecified urinary incontinence: Secondary | ICD-10-CM | POA: Diagnosis not present

## 2023-04-01 DIAGNOSIS — M549 Dorsalgia, unspecified: Secondary | ICD-10-CM | POA: Diagnosis not present

## 2023-04-01 DIAGNOSIS — I5032 Chronic diastolic (congestive) heart failure: Secondary | ICD-10-CM | POA: Diagnosis not present

## 2023-04-01 DIAGNOSIS — G309 Alzheimer's disease, unspecified: Secondary | ICD-10-CM | POA: Diagnosis not present

## 2023-04-01 DIAGNOSIS — Z741 Need for assistance with personal care: Secondary | ICD-10-CM | POA: Diagnosis not present

## 2023-04-01 DIAGNOSIS — F0280 Dementia in other diseases classified elsewhere without behavioral disturbance: Secondary | ICD-10-CM | POA: Diagnosis not present

## 2023-04-03 DIAGNOSIS — I5032 Chronic diastolic (congestive) heart failure: Secondary | ICD-10-CM | POA: Diagnosis not present

## 2023-04-03 DIAGNOSIS — M549 Dorsalgia, unspecified: Secondary | ICD-10-CM | POA: Diagnosis not present

## 2023-04-03 DIAGNOSIS — F0280 Dementia in other diseases classified elsewhere without behavioral disturbance: Secondary | ICD-10-CM | POA: Diagnosis not present

## 2023-04-03 DIAGNOSIS — R32 Unspecified urinary incontinence: Secondary | ICD-10-CM | POA: Diagnosis not present

## 2023-04-03 DIAGNOSIS — G309 Alzheimer's disease, unspecified: Secondary | ICD-10-CM | POA: Diagnosis not present

## 2023-04-03 DIAGNOSIS — Z741 Need for assistance with personal care: Secondary | ICD-10-CM | POA: Diagnosis not present

## 2023-04-05 DIAGNOSIS — I5032 Chronic diastolic (congestive) heart failure: Secondary | ICD-10-CM | POA: Diagnosis not present

## 2023-04-05 DIAGNOSIS — G309 Alzheimer's disease, unspecified: Secondary | ICD-10-CM | POA: Diagnosis not present

## 2023-04-05 DIAGNOSIS — Z741 Need for assistance with personal care: Secondary | ICD-10-CM | POA: Diagnosis not present

## 2023-04-05 DIAGNOSIS — R32 Unspecified urinary incontinence: Secondary | ICD-10-CM | POA: Diagnosis not present

## 2023-04-05 DIAGNOSIS — F0280 Dementia in other diseases classified elsewhere without behavioral disturbance: Secondary | ICD-10-CM | POA: Diagnosis not present

## 2023-04-05 DIAGNOSIS — M549 Dorsalgia, unspecified: Secondary | ICD-10-CM | POA: Diagnosis not present

## 2023-04-07 DIAGNOSIS — G309 Alzheimer's disease, unspecified: Secondary | ICD-10-CM | POA: Diagnosis not present

## 2023-04-07 DIAGNOSIS — M549 Dorsalgia, unspecified: Secondary | ICD-10-CM | POA: Diagnosis not present

## 2023-04-07 DIAGNOSIS — F0280 Dementia in other diseases classified elsewhere without behavioral disturbance: Secondary | ICD-10-CM | POA: Diagnosis not present

## 2023-04-07 DIAGNOSIS — I5032 Chronic diastolic (congestive) heart failure: Secondary | ICD-10-CM | POA: Diagnosis not present

## 2023-04-07 DIAGNOSIS — Z741 Need for assistance with personal care: Secondary | ICD-10-CM | POA: Diagnosis not present

## 2023-04-07 DIAGNOSIS — R32 Unspecified urinary incontinence: Secondary | ICD-10-CM | POA: Diagnosis not present

## 2023-04-08 DIAGNOSIS — G309 Alzheimer's disease, unspecified: Secondary | ICD-10-CM | POA: Diagnosis not present

## 2023-04-08 DIAGNOSIS — R32 Unspecified urinary incontinence: Secondary | ICD-10-CM | POA: Diagnosis not present

## 2023-04-08 DIAGNOSIS — M549 Dorsalgia, unspecified: Secondary | ICD-10-CM | POA: Diagnosis not present

## 2023-04-08 DIAGNOSIS — F0280 Dementia in other diseases classified elsewhere without behavioral disturbance: Secondary | ICD-10-CM | POA: Diagnosis not present

## 2023-04-08 DIAGNOSIS — I5032 Chronic diastolic (congestive) heart failure: Secondary | ICD-10-CM | POA: Diagnosis not present

## 2023-04-08 DIAGNOSIS — Z741 Need for assistance with personal care: Secondary | ICD-10-CM | POA: Diagnosis not present

## 2023-04-13 DIAGNOSIS — I5032 Chronic diastolic (congestive) heart failure: Secondary | ICD-10-CM | POA: Diagnosis not present

## 2023-04-13 DIAGNOSIS — Z741 Need for assistance with personal care: Secondary | ICD-10-CM | POA: Diagnosis not present

## 2023-04-13 DIAGNOSIS — F0280 Dementia in other diseases classified elsewhere without behavioral disturbance: Secondary | ICD-10-CM | POA: Diagnosis not present

## 2023-04-13 DIAGNOSIS — G309 Alzheimer's disease, unspecified: Secondary | ICD-10-CM | POA: Diagnosis not present

## 2023-04-13 DIAGNOSIS — M549 Dorsalgia, unspecified: Secondary | ICD-10-CM | POA: Diagnosis not present

## 2023-04-13 DIAGNOSIS — R32 Unspecified urinary incontinence: Secondary | ICD-10-CM | POA: Diagnosis not present

## 2023-04-14 DIAGNOSIS — I5032 Chronic diastolic (congestive) heart failure: Secondary | ICD-10-CM | POA: Diagnosis not present

## 2023-04-14 DIAGNOSIS — R32 Unspecified urinary incontinence: Secondary | ICD-10-CM | POA: Diagnosis not present

## 2023-04-14 DIAGNOSIS — Z741 Need for assistance with personal care: Secondary | ICD-10-CM | POA: Diagnosis not present

## 2023-04-14 DIAGNOSIS — M549 Dorsalgia, unspecified: Secondary | ICD-10-CM | POA: Diagnosis not present

## 2023-04-14 DIAGNOSIS — F0280 Dementia in other diseases classified elsewhere without behavioral disturbance: Secondary | ICD-10-CM | POA: Diagnosis not present

## 2023-04-14 DIAGNOSIS — G309 Alzheimer's disease, unspecified: Secondary | ICD-10-CM | POA: Diagnosis not present

## 2023-04-15 DIAGNOSIS — F0280 Dementia in other diseases classified elsewhere without behavioral disturbance: Secondary | ICD-10-CM | POA: Diagnosis not present

## 2023-04-15 DIAGNOSIS — G309 Alzheimer's disease, unspecified: Secondary | ICD-10-CM | POA: Diagnosis not present

## 2023-04-15 DIAGNOSIS — Z741 Need for assistance with personal care: Secondary | ICD-10-CM | POA: Diagnosis not present

## 2023-04-15 DIAGNOSIS — I5032 Chronic diastolic (congestive) heart failure: Secondary | ICD-10-CM | POA: Diagnosis not present

## 2023-04-15 DIAGNOSIS — M549 Dorsalgia, unspecified: Secondary | ICD-10-CM | POA: Diagnosis not present

## 2023-04-15 DIAGNOSIS — R32 Unspecified urinary incontinence: Secondary | ICD-10-CM | POA: Diagnosis not present

## 2023-04-19 DIAGNOSIS — Z741 Need for assistance with personal care: Secondary | ICD-10-CM | POA: Diagnosis not present

## 2023-04-19 DIAGNOSIS — G309 Alzheimer's disease, unspecified: Secondary | ICD-10-CM | POA: Diagnosis not present

## 2023-04-19 DIAGNOSIS — M549 Dorsalgia, unspecified: Secondary | ICD-10-CM | POA: Diagnosis not present

## 2023-04-19 DIAGNOSIS — R32 Unspecified urinary incontinence: Secondary | ICD-10-CM | POA: Diagnosis not present

## 2023-04-19 DIAGNOSIS — I5032 Chronic diastolic (congestive) heart failure: Secondary | ICD-10-CM | POA: Diagnosis not present

## 2023-04-19 DIAGNOSIS — F0280 Dementia in other diseases classified elsewhere without behavioral disturbance: Secondary | ICD-10-CM | POA: Diagnosis not present

## 2023-04-22 DIAGNOSIS — I5032 Chronic diastolic (congestive) heart failure: Secondary | ICD-10-CM | POA: Diagnosis not present

## 2023-04-22 DIAGNOSIS — G309 Alzheimer's disease, unspecified: Secondary | ICD-10-CM | POA: Diagnosis not present

## 2023-04-22 DIAGNOSIS — F0280 Dementia in other diseases classified elsewhere without behavioral disturbance: Secondary | ICD-10-CM | POA: Diagnosis not present

## 2023-04-22 DIAGNOSIS — R32 Unspecified urinary incontinence: Secondary | ICD-10-CM | POA: Diagnosis not present

## 2023-04-22 DIAGNOSIS — Z741 Need for assistance with personal care: Secondary | ICD-10-CM | POA: Diagnosis not present

## 2023-04-22 DIAGNOSIS — M549 Dorsalgia, unspecified: Secondary | ICD-10-CM | POA: Diagnosis not present

## 2023-04-24 DIAGNOSIS — Z741 Need for assistance with personal care: Secondary | ICD-10-CM | POA: Diagnosis not present

## 2023-04-24 DIAGNOSIS — G309 Alzheimer's disease, unspecified: Secondary | ICD-10-CM | POA: Diagnosis not present

## 2023-04-24 DIAGNOSIS — R159 Full incontinence of feces: Secondary | ICD-10-CM | POA: Diagnosis not present

## 2023-04-24 DIAGNOSIS — Z515 Encounter for palliative care: Secondary | ICD-10-CM | POA: Diagnosis not present

## 2023-04-24 DIAGNOSIS — I5032 Chronic diastolic (congestive) heart failure: Secondary | ICD-10-CM | POA: Diagnosis not present

## 2023-04-24 DIAGNOSIS — Z66 Do not resuscitate: Secondary | ICD-10-CM | POA: Diagnosis not present

## 2023-04-24 DIAGNOSIS — R32 Unspecified urinary incontinence: Secondary | ICD-10-CM | POA: Diagnosis not present

## 2023-04-24 DIAGNOSIS — M549 Dorsalgia, unspecified: Secondary | ICD-10-CM | POA: Diagnosis not present

## 2023-04-24 DIAGNOSIS — F0280 Dementia in other diseases classified elsewhere without behavioral disturbance: Secondary | ICD-10-CM | POA: Diagnosis not present

## 2023-04-26 DIAGNOSIS — G309 Alzheimer's disease, unspecified: Secondary | ICD-10-CM | POA: Diagnosis not present

## 2023-04-26 DIAGNOSIS — F0280 Dementia in other diseases classified elsewhere without behavioral disturbance: Secondary | ICD-10-CM | POA: Diagnosis not present

## 2023-04-26 DIAGNOSIS — I5032 Chronic diastolic (congestive) heart failure: Secondary | ICD-10-CM | POA: Diagnosis not present

## 2023-04-26 DIAGNOSIS — M549 Dorsalgia, unspecified: Secondary | ICD-10-CM | POA: Diagnosis not present

## 2023-04-26 DIAGNOSIS — Z741 Need for assistance with personal care: Secondary | ICD-10-CM | POA: Diagnosis not present

## 2023-04-26 DIAGNOSIS — R32 Unspecified urinary incontinence: Secondary | ICD-10-CM | POA: Diagnosis not present

## 2023-04-28 DIAGNOSIS — Z741 Need for assistance with personal care: Secondary | ICD-10-CM | POA: Diagnosis not present

## 2023-04-28 DIAGNOSIS — R32 Unspecified urinary incontinence: Secondary | ICD-10-CM | POA: Diagnosis not present

## 2023-04-28 DIAGNOSIS — G309 Alzheimer's disease, unspecified: Secondary | ICD-10-CM | POA: Diagnosis not present

## 2023-04-28 DIAGNOSIS — I5032 Chronic diastolic (congestive) heart failure: Secondary | ICD-10-CM | POA: Diagnosis not present

## 2023-04-28 DIAGNOSIS — F0280 Dementia in other diseases classified elsewhere without behavioral disturbance: Secondary | ICD-10-CM | POA: Diagnosis not present

## 2023-04-28 DIAGNOSIS — M549 Dorsalgia, unspecified: Secondary | ICD-10-CM | POA: Diagnosis not present

## 2023-05-01 ENCOUNTER — Other Ambulatory Visit: Payer: Self-pay | Admitting: Internal Medicine

## 2023-05-03 DIAGNOSIS — R32 Unspecified urinary incontinence: Secondary | ICD-10-CM | POA: Diagnosis not present

## 2023-05-03 DIAGNOSIS — I5032 Chronic diastolic (congestive) heart failure: Secondary | ICD-10-CM | POA: Diagnosis not present

## 2023-05-03 DIAGNOSIS — F0280 Dementia in other diseases classified elsewhere without behavioral disturbance: Secondary | ICD-10-CM | POA: Diagnosis not present

## 2023-05-03 DIAGNOSIS — G309 Alzheimer's disease, unspecified: Secondary | ICD-10-CM | POA: Diagnosis not present

## 2023-05-03 DIAGNOSIS — Z741 Need for assistance with personal care: Secondary | ICD-10-CM | POA: Diagnosis not present

## 2023-05-03 DIAGNOSIS — M549 Dorsalgia, unspecified: Secondary | ICD-10-CM | POA: Diagnosis not present

## 2023-05-05 DIAGNOSIS — M549 Dorsalgia, unspecified: Secondary | ICD-10-CM | POA: Diagnosis not present

## 2023-05-05 DIAGNOSIS — F0280 Dementia in other diseases classified elsewhere without behavioral disturbance: Secondary | ICD-10-CM | POA: Diagnosis not present

## 2023-05-05 DIAGNOSIS — R32 Unspecified urinary incontinence: Secondary | ICD-10-CM | POA: Diagnosis not present

## 2023-05-05 DIAGNOSIS — I5032 Chronic diastolic (congestive) heart failure: Secondary | ICD-10-CM | POA: Diagnosis not present

## 2023-05-05 DIAGNOSIS — Z741 Need for assistance with personal care: Secondary | ICD-10-CM | POA: Diagnosis not present

## 2023-05-05 DIAGNOSIS — G309 Alzheimer's disease, unspecified: Secondary | ICD-10-CM | POA: Diagnosis not present

## 2023-05-06 DIAGNOSIS — R32 Unspecified urinary incontinence: Secondary | ICD-10-CM | POA: Diagnosis not present

## 2023-05-06 DIAGNOSIS — I5032 Chronic diastolic (congestive) heart failure: Secondary | ICD-10-CM | POA: Diagnosis not present

## 2023-05-06 DIAGNOSIS — Z741 Need for assistance with personal care: Secondary | ICD-10-CM | POA: Diagnosis not present

## 2023-05-06 DIAGNOSIS — M549 Dorsalgia, unspecified: Secondary | ICD-10-CM | POA: Diagnosis not present

## 2023-05-06 DIAGNOSIS — G309 Alzheimer's disease, unspecified: Secondary | ICD-10-CM | POA: Diagnosis not present

## 2023-05-06 DIAGNOSIS — F0280 Dementia in other diseases classified elsewhere without behavioral disturbance: Secondary | ICD-10-CM | POA: Diagnosis not present

## 2023-05-10 DIAGNOSIS — F0280 Dementia in other diseases classified elsewhere without behavioral disturbance: Secondary | ICD-10-CM | POA: Diagnosis not present

## 2023-05-10 DIAGNOSIS — M549 Dorsalgia, unspecified: Secondary | ICD-10-CM | POA: Diagnosis not present

## 2023-05-10 DIAGNOSIS — R32 Unspecified urinary incontinence: Secondary | ICD-10-CM | POA: Diagnosis not present

## 2023-05-10 DIAGNOSIS — I5032 Chronic diastolic (congestive) heart failure: Secondary | ICD-10-CM | POA: Diagnosis not present

## 2023-05-10 DIAGNOSIS — Z741 Need for assistance with personal care: Secondary | ICD-10-CM | POA: Diagnosis not present

## 2023-05-10 DIAGNOSIS — G309 Alzheimer's disease, unspecified: Secondary | ICD-10-CM | POA: Diagnosis not present

## 2023-05-11 ENCOUNTER — Other Ambulatory Visit: Payer: Self-pay | Admitting: Internal Medicine

## 2023-05-11 DIAGNOSIS — G309 Alzheimer's disease, unspecified: Secondary | ICD-10-CM | POA: Diagnosis not present

## 2023-05-11 DIAGNOSIS — M549 Dorsalgia, unspecified: Secondary | ICD-10-CM | POA: Diagnosis not present

## 2023-05-11 DIAGNOSIS — R32 Unspecified urinary incontinence: Secondary | ICD-10-CM | POA: Diagnosis not present

## 2023-05-11 DIAGNOSIS — I5032 Chronic diastolic (congestive) heart failure: Secondary | ICD-10-CM | POA: Diagnosis not present

## 2023-05-11 DIAGNOSIS — Z741 Need for assistance with personal care: Secondary | ICD-10-CM | POA: Diagnosis not present

## 2023-05-11 DIAGNOSIS — F0280 Dementia in other diseases classified elsewhere without behavioral disturbance: Secondary | ICD-10-CM | POA: Diagnosis not present

## 2023-05-12 DIAGNOSIS — R32 Unspecified urinary incontinence: Secondary | ICD-10-CM | POA: Diagnosis not present

## 2023-05-12 DIAGNOSIS — G309 Alzheimer's disease, unspecified: Secondary | ICD-10-CM | POA: Diagnosis not present

## 2023-05-12 DIAGNOSIS — I5032 Chronic diastolic (congestive) heart failure: Secondary | ICD-10-CM | POA: Diagnosis not present

## 2023-05-12 DIAGNOSIS — F0280 Dementia in other diseases classified elsewhere without behavioral disturbance: Secondary | ICD-10-CM | POA: Diagnosis not present

## 2023-05-12 DIAGNOSIS — Z741 Need for assistance with personal care: Secondary | ICD-10-CM | POA: Diagnosis not present

## 2023-05-12 DIAGNOSIS — M549 Dorsalgia, unspecified: Secondary | ICD-10-CM | POA: Diagnosis not present

## 2023-05-13 DIAGNOSIS — I5032 Chronic diastolic (congestive) heart failure: Secondary | ICD-10-CM | POA: Diagnosis not present

## 2023-05-13 DIAGNOSIS — F0280 Dementia in other diseases classified elsewhere without behavioral disturbance: Secondary | ICD-10-CM | POA: Diagnosis not present

## 2023-05-13 DIAGNOSIS — M549 Dorsalgia, unspecified: Secondary | ICD-10-CM | POA: Diagnosis not present

## 2023-05-13 DIAGNOSIS — R32 Unspecified urinary incontinence: Secondary | ICD-10-CM | POA: Diagnosis not present

## 2023-05-13 DIAGNOSIS — G309 Alzheimer's disease, unspecified: Secondary | ICD-10-CM | POA: Diagnosis not present

## 2023-05-13 DIAGNOSIS — Z741 Need for assistance with personal care: Secondary | ICD-10-CM | POA: Diagnosis not present

## 2023-05-17 ENCOUNTER — Telehealth: Payer: Self-pay

## 2023-05-17 ENCOUNTER — Other Ambulatory Visit (HOSPITAL_COMMUNITY): Payer: Self-pay

## 2023-05-17 DIAGNOSIS — G309 Alzheimer's disease, unspecified: Secondary | ICD-10-CM | POA: Diagnosis not present

## 2023-05-17 DIAGNOSIS — I5032 Chronic diastolic (congestive) heart failure: Secondary | ICD-10-CM | POA: Diagnosis not present

## 2023-05-17 DIAGNOSIS — Z741 Need for assistance with personal care: Secondary | ICD-10-CM | POA: Diagnosis not present

## 2023-05-17 DIAGNOSIS — R32 Unspecified urinary incontinence: Secondary | ICD-10-CM | POA: Diagnosis not present

## 2023-05-17 DIAGNOSIS — M549 Dorsalgia, unspecified: Secondary | ICD-10-CM | POA: Diagnosis not present

## 2023-05-17 DIAGNOSIS — F0280 Dementia in other diseases classified elsewhere without behavioral disturbance: Secondary | ICD-10-CM | POA: Diagnosis not present

## 2023-05-17 NOTE — Telephone Encounter (Signed)
Pharmacy Patient Advocate Encounter   Received notification from Patient Advice Request messages that prior authorization for Klor Con CAPSULES is required/requested.   Insurance verification completed.   The patient is insured through  [employer]  .   Per test claim: Ins Prefers:  Potassium Chloride Tablets is preferred by the insurance.  If suggested medication is appropriate, Please send in a new RX and discontinue this one. If not, please advise as to why it's not appropriate so that we may request a Prior Authorization. Please note, some preferred medications may still require a PA

## 2023-05-17 NOTE — Telephone Encounter (Signed)
Per test claim, generic Klor Con 8 MEQ tab is covered at $1.55, please change if appropriate or advise as to why pt requires capsules. Thank you.

## 2023-05-19 NOTE — Telephone Encounter (Signed)
Please read below message, provide information as to why changing to preferred med is necessary for pt. Thanks

## 2023-05-20 DIAGNOSIS — M549 Dorsalgia, unspecified: Secondary | ICD-10-CM | POA: Diagnosis not present

## 2023-05-20 DIAGNOSIS — R32 Unspecified urinary incontinence: Secondary | ICD-10-CM | POA: Diagnosis not present

## 2023-05-20 DIAGNOSIS — G309 Alzheimer's disease, unspecified: Secondary | ICD-10-CM | POA: Diagnosis not present

## 2023-05-20 DIAGNOSIS — I5032 Chronic diastolic (congestive) heart failure: Secondary | ICD-10-CM | POA: Diagnosis not present

## 2023-05-20 DIAGNOSIS — Z741 Need for assistance with personal care: Secondary | ICD-10-CM | POA: Diagnosis not present

## 2023-05-20 DIAGNOSIS — F0280 Dementia in other diseases classified elsewhere without behavioral disturbance: Secondary | ICD-10-CM | POA: Diagnosis not present

## 2023-05-20 MED ORDER — POTASSIUM CHLORIDE ER 8 MEQ PO TBCR: 8.0000 meq | EXTENDED_RELEASE_TABLET | Freq: Every day | ORAL | 3 refills | Status: DC

## 2023-05-20 NOTE — Telephone Encounter (Signed)
Okay. Thank you.

## 2023-05-20 NOTE — Addendum Note (Signed)
Addended by: Tresa Garter on: 05/20/2023 08:05 AM   Modules accepted: Orders

## 2023-05-24 DIAGNOSIS — F0280 Dementia in other diseases classified elsewhere without behavioral disturbance: Secondary | ICD-10-CM | POA: Diagnosis not present

## 2023-05-24 DIAGNOSIS — R32 Unspecified urinary incontinence: Secondary | ICD-10-CM | POA: Diagnosis not present

## 2023-05-24 DIAGNOSIS — G309 Alzheimer's disease, unspecified: Secondary | ICD-10-CM | POA: Diagnosis not present

## 2023-05-24 DIAGNOSIS — I5032 Chronic diastolic (congestive) heart failure: Secondary | ICD-10-CM | POA: Diagnosis not present

## 2023-05-24 DIAGNOSIS — Z741 Need for assistance with personal care: Secondary | ICD-10-CM | POA: Diagnosis not present

## 2023-05-24 DIAGNOSIS — M549 Dorsalgia, unspecified: Secondary | ICD-10-CM | POA: Diagnosis not present

## 2023-05-25 DIAGNOSIS — F0280 Dementia in other diseases classified elsewhere without behavioral disturbance: Secondary | ICD-10-CM | POA: Diagnosis not present

## 2023-05-25 DIAGNOSIS — R159 Full incontinence of feces: Secondary | ICD-10-CM | POA: Diagnosis not present

## 2023-05-25 DIAGNOSIS — M549 Dorsalgia, unspecified: Secondary | ICD-10-CM | POA: Diagnosis not present

## 2023-05-25 DIAGNOSIS — Z66 Do not resuscitate: Secondary | ICD-10-CM | POA: Diagnosis not present

## 2023-05-25 DIAGNOSIS — R32 Unspecified urinary incontinence: Secondary | ICD-10-CM | POA: Diagnosis not present

## 2023-05-25 DIAGNOSIS — G309 Alzheimer's disease, unspecified: Secondary | ICD-10-CM | POA: Diagnosis not present

## 2023-05-25 DIAGNOSIS — I5032 Chronic diastolic (congestive) heart failure: Secondary | ICD-10-CM | POA: Diagnosis not present

## 2023-05-25 DIAGNOSIS — Z741 Need for assistance with personal care: Secondary | ICD-10-CM | POA: Diagnosis not present

## 2023-05-25 DIAGNOSIS — Z515 Encounter for palliative care: Secondary | ICD-10-CM | POA: Diagnosis not present

## 2023-05-31 DIAGNOSIS — M549 Dorsalgia, unspecified: Secondary | ICD-10-CM | POA: Diagnosis not present

## 2023-05-31 DIAGNOSIS — F0280 Dementia in other diseases classified elsewhere without behavioral disturbance: Secondary | ICD-10-CM | POA: Diagnosis not present

## 2023-05-31 DIAGNOSIS — R32 Unspecified urinary incontinence: Secondary | ICD-10-CM | POA: Diagnosis not present

## 2023-05-31 DIAGNOSIS — G309 Alzheimer's disease, unspecified: Secondary | ICD-10-CM | POA: Diagnosis not present

## 2023-05-31 DIAGNOSIS — I5032 Chronic diastolic (congestive) heart failure: Secondary | ICD-10-CM | POA: Diagnosis not present

## 2023-05-31 DIAGNOSIS — Z741 Need for assistance with personal care: Secondary | ICD-10-CM | POA: Diagnosis not present

## 2023-06-02 DIAGNOSIS — R32 Unspecified urinary incontinence: Secondary | ICD-10-CM | POA: Diagnosis not present

## 2023-06-02 DIAGNOSIS — I5032 Chronic diastolic (congestive) heart failure: Secondary | ICD-10-CM | POA: Diagnosis not present

## 2023-06-02 DIAGNOSIS — M549 Dorsalgia, unspecified: Secondary | ICD-10-CM | POA: Diagnosis not present

## 2023-06-02 DIAGNOSIS — Z741 Need for assistance with personal care: Secondary | ICD-10-CM | POA: Diagnosis not present

## 2023-06-02 DIAGNOSIS — F0280 Dementia in other diseases classified elsewhere without behavioral disturbance: Secondary | ICD-10-CM | POA: Diagnosis not present

## 2023-06-02 DIAGNOSIS — G309 Alzheimer's disease, unspecified: Secondary | ICD-10-CM | POA: Diagnosis not present

## 2023-06-07 DIAGNOSIS — R32 Unspecified urinary incontinence: Secondary | ICD-10-CM | POA: Diagnosis not present

## 2023-06-07 DIAGNOSIS — G309 Alzheimer's disease, unspecified: Secondary | ICD-10-CM | POA: Diagnosis not present

## 2023-06-07 DIAGNOSIS — M549 Dorsalgia, unspecified: Secondary | ICD-10-CM | POA: Diagnosis not present

## 2023-06-07 DIAGNOSIS — F0280 Dementia in other diseases classified elsewhere without behavioral disturbance: Secondary | ICD-10-CM | POA: Diagnosis not present

## 2023-06-07 DIAGNOSIS — Z741 Need for assistance with personal care: Secondary | ICD-10-CM | POA: Diagnosis not present

## 2023-06-07 DIAGNOSIS — I5032 Chronic diastolic (congestive) heart failure: Secondary | ICD-10-CM | POA: Diagnosis not present

## 2023-06-09 DIAGNOSIS — M549 Dorsalgia, unspecified: Secondary | ICD-10-CM | POA: Diagnosis not present

## 2023-06-09 DIAGNOSIS — I5032 Chronic diastolic (congestive) heart failure: Secondary | ICD-10-CM | POA: Diagnosis not present

## 2023-06-09 DIAGNOSIS — F0280 Dementia in other diseases classified elsewhere without behavioral disturbance: Secondary | ICD-10-CM | POA: Diagnosis not present

## 2023-06-09 DIAGNOSIS — Z741 Need for assistance with personal care: Secondary | ICD-10-CM | POA: Diagnosis not present

## 2023-06-09 DIAGNOSIS — R32 Unspecified urinary incontinence: Secondary | ICD-10-CM | POA: Diagnosis not present

## 2023-06-09 DIAGNOSIS — G309 Alzheimer's disease, unspecified: Secondary | ICD-10-CM | POA: Diagnosis not present

## 2023-06-10 DIAGNOSIS — M549 Dorsalgia, unspecified: Secondary | ICD-10-CM | POA: Diagnosis not present

## 2023-06-10 DIAGNOSIS — I5032 Chronic diastolic (congestive) heart failure: Secondary | ICD-10-CM | POA: Diagnosis not present

## 2023-06-10 DIAGNOSIS — R32 Unspecified urinary incontinence: Secondary | ICD-10-CM | POA: Diagnosis not present

## 2023-06-10 DIAGNOSIS — F0280 Dementia in other diseases classified elsewhere without behavioral disturbance: Secondary | ICD-10-CM | POA: Diagnosis not present

## 2023-06-10 DIAGNOSIS — Z741 Need for assistance with personal care: Secondary | ICD-10-CM | POA: Diagnosis not present

## 2023-06-10 DIAGNOSIS — G309 Alzheimer's disease, unspecified: Secondary | ICD-10-CM | POA: Diagnosis not present

## 2023-06-11 DIAGNOSIS — F0280 Dementia in other diseases classified elsewhere without behavioral disturbance: Secondary | ICD-10-CM | POA: Diagnosis not present

## 2023-06-11 DIAGNOSIS — I5032 Chronic diastolic (congestive) heart failure: Secondary | ICD-10-CM | POA: Diagnosis not present

## 2023-06-11 DIAGNOSIS — R32 Unspecified urinary incontinence: Secondary | ICD-10-CM | POA: Diagnosis not present

## 2023-06-11 DIAGNOSIS — Z741 Need for assistance with personal care: Secondary | ICD-10-CM | POA: Diagnosis not present

## 2023-06-11 DIAGNOSIS — M549 Dorsalgia, unspecified: Secondary | ICD-10-CM | POA: Diagnosis not present

## 2023-06-11 DIAGNOSIS — G309 Alzheimer's disease, unspecified: Secondary | ICD-10-CM | POA: Diagnosis not present

## 2023-06-14 DIAGNOSIS — G309 Alzheimer's disease, unspecified: Secondary | ICD-10-CM | POA: Diagnosis not present

## 2023-06-14 DIAGNOSIS — I5032 Chronic diastolic (congestive) heart failure: Secondary | ICD-10-CM | POA: Diagnosis not present

## 2023-06-14 DIAGNOSIS — Z741 Need for assistance with personal care: Secondary | ICD-10-CM | POA: Diagnosis not present

## 2023-06-14 DIAGNOSIS — F0280 Dementia in other diseases classified elsewhere without behavioral disturbance: Secondary | ICD-10-CM | POA: Diagnosis not present

## 2023-06-14 DIAGNOSIS — R32 Unspecified urinary incontinence: Secondary | ICD-10-CM | POA: Diagnosis not present

## 2023-06-14 DIAGNOSIS — M549 Dorsalgia, unspecified: Secondary | ICD-10-CM | POA: Diagnosis not present

## 2023-06-16 DIAGNOSIS — G309 Alzheimer's disease, unspecified: Secondary | ICD-10-CM | POA: Diagnosis not present

## 2023-06-16 DIAGNOSIS — M549 Dorsalgia, unspecified: Secondary | ICD-10-CM | POA: Diagnosis not present

## 2023-06-16 DIAGNOSIS — Z741 Need for assistance with personal care: Secondary | ICD-10-CM | POA: Diagnosis not present

## 2023-06-16 DIAGNOSIS — I5032 Chronic diastolic (congestive) heart failure: Secondary | ICD-10-CM | POA: Diagnosis not present

## 2023-06-16 DIAGNOSIS — F0280 Dementia in other diseases classified elsewhere without behavioral disturbance: Secondary | ICD-10-CM | POA: Diagnosis not present

## 2023-06-16 DIAGNOSIS — R32 Unspecified urinary incontinence: Secondary | ICD-10-CM | POA: Diagnosis not present

## 2023-06-17 DIAGNOSIS — I5032 Chronic diastolic (congestive) heart failure: Secondary | ICD-10-CM | POA: Diagnosis not present

## 2023-06-17 DIAGNOSIS — F0280 Dementia in other diseases classified elsewhere without behavioral disturbance: Secondary | ICD-10-CM | POA: Diagnosis not present

## 2023-06-17 DIAGNOSIS — G309 Alzheimer's disease, unspecified: Secondary | ICD-10-CM | POA: Diagnosis not present

## 2023-06-17 DIAGNOSIS — Z741 Need for assistance with personal care: Secondary | ICD-10-CM | POA: Diagnosis not present

## 2023-06-17 DIAGNOSIS — R32 Unspecified urinary incontinence: Secondary | ICD-10-CM | POA: Diagnosis not present

## 2023-06-17 DIAGNOSIS — M549 Dorsalgia, unspecified: Secondary | ICD-10-CM | POA: Diagnosis not present

## 2023-06-21 DIAGNOSIS — M549 Dorsalgia, unspecified: Secondary | ICD-10-CM | POA: Diagnosis not present

## 2023-06-21 DIAGNOSIS — F0280 Dementia in other diseases classified elsewhere without behavioral disturbance: Secondary | ICD-10-CM | POA: Diagnosis not present

## 2023-06-21 DIAGNOSIS — G309 Alzheimer's disease, unspecified: Secondary | ICD-10-CM | POA: Diagnosis not present

## 2023-06-21 DIAGNOSIS — Z741 Need for assistance with personal care: Secondary | ICD-10-CM | POA: Diagnosis not present

## 2023-06-21 DIAGNOSIS — R32 Unspecified urinary incontinence: Secondary | ICD-10-CM | POA: Diagnosis not present

## 2023-06-21 DIAGNOSIS — I5032 Chronic diastolic (congestive) heart failure: Secondary | ICD-10-CM | POA: Diagnosis not present

## 2023-06-23 DIAGNOSIS — G309 Alzheimer's disease, unspecified: Secondary | ICD-10-CM | POA: Diagnosis not present

## 2023-06-23 DIAGNOSIS — I5032 Chronic diastolic (congestive) heart failure: Secondary | ICD-10-CM | POA: Diagnosis not present

## 2023-06-23 DIAGNOSIS — M549 Dorsalgia, unspecified: Secondary | ICD-10-CM | POA: Diagnosis not present

## 2023-06-23 DIAGNOSIS — Z741 Need for assistance with personal care: Secondary | ICD-10-CM | POA: Diagnosis not present

## 2023-06-23 DIAGNOSIS — F0280 Dementia in other diseases classified elsewhere without behavioral disturbance: Secondary | ICD-10-CM | POA: Diagnosis not present

## 2023-06-23 DIAGNOSIS — R32 Unspecified urinary incontinence: Secondary | ICD-10-CM | POA: Diagnosis not present

## 2023-06-25 DIAGNOSIS — Z66 Do not resuscitate: Secondary | ICD-10-CM | POA: Diagnosis not present

## 2023-06-25 DIAGNOSIS — I5032 Chronic diastolic (congestive) heart failure: Secondary | ICD-10-CM | POA: Diagnosis not present

## 2023-06-25 DIAGNOSIS — M549 Dorsalgia, unspecified: Secondary | ICD-10-CM | POA: Diagnosis not present

## 2023-06-25 DIAGNOSIS — Z741 Need for assistance with personal care: Secondary | ICD-10-CM | POA: Diagnosis not present

## 2023-06-25 DIAGNOSIS — F0280 Dementia in other diseases classified elsewhere without behavioral disturbance: Secondary | ICD-10-CM | POA: Diagnosis not present

## 2023-06-25 DIAGNOSIS — Z515 Encounter for palliative care: Secondary | ICD-10-CM | POA: Diagnosis not present

## 2023-06-25 DIAGNOSIS — R32 Unspecified urinary incontinence: Secondary | ICD-10-CM | POA: Diagnosis not present

## 2023-06-25 DIAGNOSIS — G309 Alzheimer's disease, unspecified: Secondary | ICD-10-CM | POA: Diagnosis not present

## 2023-06-25 DIAGNOSIS — R159 Full incontinence of feces: Secondary | ICD-10-CM | POA: Diagnosis not present

## 2023-06-28 DIAGNOSIS — R32 Unspecified urinary incontinence: Secondary | ICD-10-CM | POA: Diagnosis not present

## 2023-06-28 DIAGNOSIS — F0280 Dementia in other diseases classified elsewhere without behavioral disturbance: Secondary | ICD-10-CM | POA: Diagnosis not present

## 2023-06-28 DIAGNOSIS — Z741 Need for assistance with personal care: Secondary | ICD-10-CM | POA: Diagnosis not present

## 2023-06-28 DIAGNOSIS — I5032 Chronic diastolic (congestive) heart failure: Secondary | ICD-10-CM | POA: Diagnosis not present

## 2023-06-28 DIAGNOSIS — G309 Alzheimer's disease, unspecified: Secondary | ICD-10-CM | POA: Diagnosis not present

## 2023-06-28 DIAGNOSIS — M549 Dorsalgia, unspecified: Secondary | ICD-10-CM | POA: Diagnosis not present

## 2023-06-30 DIAGNOSIS — I5032 Chronic diastolic (congestive) heart failure: Secondary | ICD-10-CM | POA: Diagnosis not present

## 2023-06-30 DIAGNOSIS — M549 Dorsalgia, unspecified: Secondary | ICD-10-CM | POA: Diagnosis not present

## 2023-06-30 DIAGNOSIS — G309 Alzheimer's disease, unspecified: Secondary | ICD-10-CM | POA: Diagnosis not present

## 2023-06-30 DIAGNOSIS — Z741 Need for assistance with personal care: Secondary | ICD-10-CM | POA: Diagnosis not present

## 2023-06-30 DIAGNOSIS — R32 Unspecified urinary incontinence: Secondary | ICD-10-CM | POA: Diagnosis not present

## 2023-06-30 DIAGNOSIS — F0280 Dementia in other diseases classified elsewhere without behavioral disturbance: Secondary | ICD-10-CM | POA: Diagnosis not present

## 2023-07-01 DIAGNOSIS — R32 Unspecified urinary incontinence: Secondary | ICD-10-CM | POA: Diagnosis not present

## 2023-07-01 DIAGNOSIS — M549 Dorsalgia, unspecified: Secondary | ICD-10-CM | POA: Diagnosis not present

## 2023-07-01 DIAGNOSIS — G309 Alzheimer's disease, unspecified: Secondary | ICD-10-CM | POA: Diagnosis not present

## 2023-07-01 DIAGNOSIS — Z741 Need for assistance with personal care: Secondary | ICD-10-CM | POA: Diagnosis not present

## 2023-07-01 DIAGNOSIS — F0280 Dementia in other diseases classified elsewhere without behavioral disturbance: Secondary | ICD-10-CM | POA: Diagnosis not present

## 2023-07-01 DIAGNOSIS — I5032 Chronic diastolic (congestive) heart failure: Secondary | ICD-10-CM | POA: Diagnosis not present

## 2023-07-05 DIAGNOSIS — I5032 Chronic diastolic (congestive) heart failure: Secondary | ICD-10-CM | POA: Diagnosis not present

## 2023-07-05 DIAGNOSIS — F0280 Dementia in other diseases classified elsewhere without behavioral disturbance: Secondary | ICD-10-CM | POA: Diagnosis not present

## 2023-07-05 DIAGNOSIS — G309 Alzheimer's disease, unspecified: Secondary | ICD-10-CM | POA: Diagnosis not present

## 2023-07-05 DIAGNOSIS — Z741 Need for assistance with personal care: Secondary | ICD-10-CM | POA: Diagnosis not present

## 2023-07-05 DIAGNOSIS — R32 Unspecified urinary incontinence: Secondary | ICD-10-CM | POA: Diagnosis not present

## 2023-07-05 DIAGNOSIS — M549 Dorsalgia, unspecified: Secondary | ICD-10-CM | POA: Diagnosis not present

## 2023-07-07 DIAGNOSIS — F0280 Dementia in other diseases classified elsewhere without behavioral disturbance: Secondary | ICD-10-CM | POA: Diagnosis not present

## 2023-07-07 DIAGNOSIS — I5032 Chronic diastolic (congestive) heart failure: Secondary | ICD-10-CM | POA: Diagnosis not present

## 2023-07-07 DIAGNOSIS — Z741 Need for assistance with personal care: Secondary | ICD-10-CM | POA: Diagnosis not present

## 2023-07-07 DIAGNOSIS — R32 Unspecified urinary incontinence: Secondary | ICD-10-CM | POA: Diagnosis not present

## 2023-07-07 DIAGNOSIS — G309 Alzheimer's disease, unspecified: Secondary | ICD-10-CM | POA: Diagnosis not present

## 2023-07-07 DIAGNOSIS — M549 Dorsalgia, unspecified: Secondary | ICD-10-CM | POA: Diagnosis not present

## 2023-07-08 DIAGNOSIS — I5032 Chronic diastolic (congestive) heart failure: Secondary | ICD-10-CM | POA: Diagnosis not present

## 2023-07-08 DIAGNOSIS — F0280 Dementia in other diseases classified elsewhere without behavioral disturbance: Secondary | ICD-10-CM | POA: Diagnosis not present

## 2023-07-08 DIAGNOSIS — R32 Unspecified urinary incontinence: Secondary | ICD-10-CM | POA: Diagnosis not present

## 2023-07-08 DIAGNOSIS — Z741 Need for assistance with personal care: Secondary | ICD-10-CM | POA: Diagnosis not present

## 2023-07-08 DIAGNOSIS — M549 Dorsalgia, unspecified: Secondary | ICD-10-CM | POA: Diagnosis not present

## 2023-07-08 DIAGNOSIS — G309 Alzheimer's disease, unspecified: Secondary | ICD-10-CM | POA: Diagnosis not present

## 2023-07-11 DIAGNOSIS — M549 Dorsalgia, unspecified: Secondary | ICD-10-CM | POA: Diagnosis not present

## 2023-07-11 DIAGNOSIS — I5032 Chronic diastolic (congestive) heart failure: Secondary | ICD-10-CM | POA: Diagnosis not present

## 2023-07-11 DIAGNOSIS — F0280 Dementia in other diseases classified elsewhere without behavioral disturbance: Secondary | ICD-10-CM | POA: Diagnosis not present

## 2023-07-11 DIAGNOSIS — R32 Unspecified urinary incontinence: Secondary | ICD-10-CM | POA: Diagnosis not present

## 2023-07-11 DIAGNOSIS — G309 Alzheimer's disease, unspecified: Secondary | ICD-10-CM | POA: Diagnosis not present

## 2023-07-11 DIAGNOSIS — Z741 Need for assistance with personal care: Secondary | ICD-10-CM | POA: Diagnosis not present

## 2023-07-12 DIAGNOSIS — I5032 Chronic diastolic (congestive) heart failure: Secondary | ICD-10-CM | POA: Diagnosis not present

## 2023-07-12 DIAGNOSIS — R32 Unspecified urinary incontinence: Secondary | ICD-10-CM | POA: Diagnosis not present

## 2023-07-12 DIAGNOSIS — F0280 Dementia in other diseases classified elsewhere without behavioral disturbance: Secondary | ICD-10-CM | POA: Diagnosis not present

## 2023-07-12 DIAGNOSIS — Z741 Need for assistance with personal care: Secondary | ICD-10-CM | POA: Diagnosis not present

## 2023-07-12 DIAGNOSIS — G309 Alzheimer's disease, unspecified: Secondary | ICD-10-CM | POA: Diagnosis not present

## 2023-07-12 DIAGNOSIS — M549 Dorsalgia, unspecified: Secondary | ICD-10-CM | POA: Diagnosis not present

## 2023-07-14 DIAGNOSIS — M549 Dorsalgia, unspecified: Secondary | ICD-10-CM | POA: Diagnosis not present

## 2023-07-14 DIAGNOSIS — F0280 Dementia in other diseases classified elsewhere without behavioral disturbance: Secondary | ICD-10-CM | POA: Diagnosis not present

## 2023-07-14 DIAGNOSIS — I5032 Chronic diastolic (congestive) heart failure: Secondary | ICD-10-CM | POA: Diagnosis not present

## 2023-07-14 DIAGNOSIS — G309 Alzheimer's disease, unspecified: Secondary | ICD-10-CM | POA: Diagnosis not present

## 2023-07-14 DIAGNOSIS — Z741 Need for assistance with personal care: Secondary | ICD-10-CM | POA: Diagnosis not present

## 2023-07-14 DIAGNOSIS — R32 Unspecified urinary incontinence: Secondary | ICD-10-CM | POA: Diagnosis not present

## 2023-07-15 DIAGNOSIS — M549 Dorsalgia, unspecified: Secondary | ICD-10-CM | POA: Diagnosis not present

## 2023-07-15 DIAGNOSIS — R32 Unspecified urinary incontinence: Secondary | ICD-10-CM | POA: Diagnosis not present

## 2023-07-15 DIAGNOSIS — I5032 Chronic diastolic (congestive) heart failure: Secondary | ICD-10-CM | POA: Diagnosis not present

## 2023-07-15 DIAGNOSIS — G309 Alzheimer's disease, unspecified: Secondary | ICD-10-CM | POA: Diagnosis not present

## 2023-07-15 DIAGNOSIS — F0280 Dementia in other diseases classified elsewhere without behavioral disturbance: Secondary | ICD-10-CM | POA: Diagnosis not present

## 2023-07-15 DIAGNOSIS — Z741 Need for assistance with personal care: Secondary | ICD-10-CM | POA: Diagnosis not present

## 2023-07-21 DIAGNOSIS — Z741 Need for assistance with personal care: Secondary | ICD-10-CM | POA: Diagnosis not present

## 2023-07-21 DIAGNOSIS — R32 Unspecified urinary incontinence: Secondary | ICD-10-CM | POA: Diagnosis not present

## 2023-07-21 DIAGNOSIS — I5032 Chronic diastolic (congestive) heart failure: Secondary | ICD-10-CM | POA: Diagnosis not present

## 2023-07-21 DIAGNOSIS — G309 Alzheimer's disease, unspecified: Secondary | ICD-10-CM | POA: Diagnosis not present

## 2023-07-21 DIAGNOSIS — M549 Dorsalgia, unspecified: Secondary | ICD-10-CM | POA: Diagnosis not present

## 2023-07-21 DIAGNOSIS — F0280 Dementia in other diseases classified elsewhere without behavioral disturbance: Secondary | ICD-10-CM | POA: Diagnosis not present

## 2023-07-22 DIAGNOSIS — Z741 Need for assistance with personal care: Secondary | ICD-10-CM | POA: Diagnosis not present

## 2023-07-22 DIAGNOSIS — I5032 Chronic diastolic (congestive) heart failure: Secondary | ICD-10-CM | POA: Diagnosis not present

## 2023-07-22 DIAGNOSIS — G309 Alzheimer's disease, unspecified: Secondary | ICD-10-CM | POA: Diagnosis not present

## 2023-07-22 DIAGNOSIS — F0280 Dementia in other diseases classified elsewhere without behavioral disturbance: Secondary | ICD-10-CM | POA: Diagnosis not present

## 2023-07-22 DIAGNOSIS — R32 Unspecified urinary incontinence: Secondary | ICD-10-CM | POA: Diagnosis not present

## 2023-07-22 DIAGNOSIS — M549 Dorsalgia, unspecified: Secondary | ICD-10-CM | POA: Diagnosis not present

## 2023-07-23 DIAGNOSIS — Z741 Need for assistance with personal care: Secondary | ICD-10-CM | POA: Diagnosis not present

## 2023-07-23 DIAGNOSIS — G309 Alzheimer's disease, unspecified: Secondary | ICD-10-CM | POA: Diagnosis not present

## 2023-07-23 DIAGNOSIS — Z66 Do not resuscitate: Secondary | ICD-10-CM | POA: Diagnosis not present

## 2023-07-23 DIAGNOSIS — F0280 Dementia in other diseases classified elsewhere without behavioral disturbance: Secondary | ICD-10-CM | POA: Diagnosis not present

## 2023-07-23 DIAGNOSIS — Z515 Encounter for palliative care: Secondary | ICD-10-CM | POA: Diagnosis not present

## 2023-07-23 DIAGNOSIS — I5032 Chronic diastolic (congestive) heart failure: Secondary | ICD-10-CM | POA: Diagnosis not present

## 2023-07-23 DIAGNOSIS — R32 Unspecified urinary incontinence: Secondary | ICD-10-CM | POA: Diagnosis not present

## 2023-07-23 DIAGNOSIS — R159 Full incontinence of feces: Secondary | ICD-10-CM | POA: Diagnosis not present

## 2023-07-23 DIAGNOSIS — M549 Dorsalgia, unspecified: Secondary | ICD-10-CM | POA: Diagnosis not present

## 2023-07-27 DIAGNOSIS — R32 Unspecified urinary incontinence: Secondary | ICD-10-CM | POA: Diagnosis not present

## 2023-07-27 DIAGNOSIS — M549 Dorsalgia, unspecified: Secondary | ICD-10-CM | POA: Diagnosis not present

## 2023-07-27 DIAGNOSIS — F0280 Dementia in other diseases classified elsewhere without behavioral disturbance: Secondary | ICD-10-CM | POA: Diagnosis not present

## 2023-07-27 DIAGNOSIS — Z741 Need for assistance with personal care: Secondary | ICD-10-CM | POA: Diagnosis not present

## 2023-07-27 DIAGNOSIS — I5032 Chronic diastolic (congestive) heart failure: Secondary | ICD-10-CM | POA: Diagnosis not present

## 2023-07-27 DIAGNOSIS — G309 Alzheimer's disease, unspecified: Secondary | ICD-10-CM | POA: Diagnosis not present

## 2023-07-28 DIAGNOSIS — F0280 Dementia in other diseases classified elsewhere without behavioral disturbance: Secondary | ICD-10-CM | POA: Diagnosis not present

## 2023-07-28 DIAGNOSIS — Z741 Need for assistance with personal care: Secondary | ICD-10-CM | POA: Diagnosis not present

## 2023-07-28 DIAGNOSIS — R32 Unspecified urinary incontinence: Secondary | ICD-10-CM | POA: Diagnosis not present

## 2023-07-28 DIAGNOSIS — G309 Alzheimer's disease, unspecified: Secondary | ICD-10-CM | POA: Diagnosis not present

## 2023-07-28 DIAGNOSIS — M549 Dorsalgia, unspecified: Secondary | ICD-10-CM | POA: Diagnosis not present

## 2023-07-28 DIAGNOSIS — I5032 Chronic diastolic (congestive) heart failure: Secondary | ICD-10-CM | POA: Diagnosis not present

## 2023-07-29 DIAGNOSIS — R32 Unspecified urinary incontinence: Secondary | ICD-10-CM | POA: Diagnosis not present

## 2023-07-29 DIAGNOSIS — Z741 Need for assistance with personal care: Secondary | ICD-10-CM | POA: Diagnosis not present

## 2023-07-29 DIAGNOSIS — I5032 Chronic diastolic (congestive) heart failure: Secondary | ICD-10-CM | POA: Diagnosis not present

## 2023-07-29 DIAGNOSIS — G309 Alzheimer's disease, unspecified: Secondary | ICD-10-CM | POA: Diagnosis not present

## 2023-07-29 DIAGNOSIS — M549 Dorsalgia, unspecified: Secondary | ICD-10-CM | POA: Diagnosis not present

## 2023-07-29 DIAGNOSIS — F0280 Dementia in other diseases classified elsewhere without behavioral disturbance: Secondary | ICD-10-CM | POA: Diagnosis not present

## 2023-08-03 DIAGNOSIS — G309 Alzheimer's disease, unspecified: Secondary | ICD-10-CM | POA: Diagnosis not present

## 2023-08-03 DIAGNOSIS — M549 Dorsalgia, unspecified: Secondary | ICD-10-CM | POA: Diagnosis not present

## 2023-08-03 DIAGNOSIS — F0280 Dementia in other diseases classified elsewhere without behavioral disturbance: Secondary | ICD-10-CM | POA: Diagnosis not present

## 2023-08-03 DIAGNOSIS — Z741 Need for assistance with personal care: Secondary | ICD-10-CM | POA: Diagnosis not present

## 2023-08-03 DIAGNOSIS — I5032 Chronic diastolic (congestive) heart failure: Secondary | ICD-10-CM | POA: Diagnosis not present

## 2023-08-03 DIAGNOSIS — R32 Unspecified urinary incontinence: Secondary | ICD-10-CM | POA: Diagnosis not present

## 2023-08-04 DIAGNOSIS — M549 Dorsalgia, unspecified: Secondary | ICD-10-CM | POA: Diagnosis not present

## 2023-08-04 DIAGNOSIS — G309 Alzheimer's disease, unspecified: Secondary | ICD-10-CM | POA: Diagnosis not present

## 2023-08-04 DIAGNOSIS — I5032 Chronic diastolic (congestive) heart failure: Secondary | ICD-10-CM | POA: Diagnosis not present

## 2023-08-04 DIAGNOSIS — Z741 Need for assistance with personal care: Secondary | ICD-10-CM | POA: Diagnosis not present

## 2023-08-04 DIAGNOSIS — R32 Unspecified urinary incontinence: Secondary | ICD-10-CM | POA: Diagnosis not present

## 2023-08-04 DIAGNOSIS — F0280 Dementia in other diseases classified elsewhere without behavioral disturbance: Secondary | ICD-10-CM | POA: Diagnosis not present

## 2023-08-05 DIAGNOSIS — Z741 Need for assistance with personal care: Secondary | ICD-10-CM | POA: Diagnosis not present

## 2023-08-05 DIAGNOSIS — M549 Dorsalgia, unspecified: Secondary | ICD-10-CM | POA: Diagnosis not present

## 2023-08-05 DIAGNOSIS — F0280 Dementia in other diseases classified elsewhere without behavioral disturbance: Secondary | ICD-10-CM | POA: Diagnosis not present

## 2023-08-05 DIAGNOSIS — R32 Unspecified urinary incontinence: Secondary | ICD-10-CM | POA: Diagnosis not present

## 2023-08-05 DIAGNOSIS — I5032 Chronic diastolic (congestive) heart failure: Secondary | ICD-10-CM | POA: Diagnosis not present

## 2023-08-05 DIAGNOSIS — G309 Alzheimer's disease, unspecified: Secondary | ICD-10-CM | POA: Diagnosis not present

## 2023-08-10 DIAGNOSIS — I5032 Chronic diastolic (congestive) heart failure: Secondary | ICD-10-CM | POA: Diagnosis not present

## 2023-08-10 DIAGNOSIS — G309 Alzheimer's disease, unspecified: Secondary | ICD-10-CM | POA: Diagnosis not present

## 2023-08-10 DIAGNOSIS — Z741 Need for assistance with personal care: Secondary | ICD-10-CM | POA: Diagnosis not present

## 2023-08-10 DIAGNOSIS — F0280 Dementia in other diseases classified elsewhere without behavioral disturbance: Secondary | ICD-10-CM | POA: Diagnosis not present

## 2023-08-10 DIAGNOSIS — R32 Unspecified urinary incontinence: Secondary | ICD-10-CM | POA: Diagnosis not present

## 2023-08-10 DIAGNOSIS — M549 Dorsalgia, unspecified: Secondary | ICD-10-CM | POA: Diagnosis not present

## 2023-08-11 DIAGNOSIS — I5032 Chronic diastolic (congestive) heart failure: Secondary | ICD-10-CM | POA: Diagnosis not present

## 2023-08-11 DIAGNOSIS — R32 Unspecified urinary incontinence: Secondary | ICD-10-CM | POA: Diagnosis not present

## 2023-08-11 DIAGNOSIS — Z741 Need for assistance with personal care: Secondary | ICD-10-CM | POA: Diagnosis not present

## 2023-08-11 DIAGNOSIS — F0280 Dementia in other diseases classified elsewhere without behavioral disturbance: Secondary | ICD-10-CM | POA: Diagnosis not present

## 2023-08-11 DIAGNOSIS — G309 Alzheimer's disease, unspecified: Secondary | ICD-10-CM | POA: Diagnosis not present

## 2023-08-11 DIAGNOSIS — M549 Dorsalgia, unspecified: Secondary | ICD-10-CM | POA: Diagnosis not present

## 2023-08-12 DIAGNOSIS — M549 Dorsalgia, unspecified: Secondary | ICD-10-CM | POA: Diagnosis not present

## 2023-08-12 DIAGNOSIS — F0280 Dementia in other diseases classified elsewhere without behavioral disturbance: Secondary | ICD-10-CM | POA: Diagnosis not present

## 2023-08-12 DIAGNOSIS — R32 Unspecified urinary incontinence: Secondary | ICD-10-CM | POA: Diagnosis not present

## 2023-08-12 DIAGNOSIS — I5032 Chronic diastolic (congestive) heart failure: Secondary | ICD-10-CM | POA: Diagnosis not present

## 2023-08-12 DIAGNOSIS — Z741 Need for assistance with personal care: Secondary | ICD-10-CM | POA: Diagnosis not present

## 2023-08-12 DIAGNOSIS — G309 Alzheimer's disease, unspecified: Secondary | ICD-10-CM | POA: Diagnosis not present

## 2023-08-15 DIAGNOSIS — G309 Alzheimer's disease, unspecified: Secondary | ICD-10-CM | POA: Diagnosis not present

## 2023-08-15 DIAGNOSIS — Z741 Need for assistance with personal care: Secondary | ICD-10-CM | POA: Diagnosis not present

## 2023-08-15 DIAGNOSIS — M549 Dorsalgia, unspecified: Secondary | ICD-10-CM | POA: Diagnosis not present

## 2023-08-15 DIAGNOSIS — R32 Unspecified urinary incontinence: Secondary | ICD-10-CM | POA: Diagnosis not present

## 2023-08-15 DIAGNOSIS — F0280 Dementia in other diseases classified elsewhere without behavioral disturbance: Secondary | ICD-10-CM | POA: Diagnosis not present

## 2023-08-15 DIAGNOSIS — I5032 Chronic diastolic (congestive) heart failure: Secondary | ICD-10-CM | POA: Diagnosis not present

## 2023-08-18 DIAGNOSIS — F0280 Dementia in other diseases classified elsewhere without behavioral disturbance: Secondary | ICD-10-CM | POA: Diagnosis not present

## 2023-08-18 DIAGNOSIS — I5032 Chronic diastolic (congestive) heart failure: Secondary | ICD-10-CM | POA: Diagnosis not present

## 2023-08-18 DIAGNOSIS — G309 Alzheimer's disease, unspecified: Secondary | ICD-10-CM | POA: Diagnosis not present

## 2023-08-18 DIAGNOSIS — R32 Unspecified urinary incontinence: Secondary | ICD-10-CM | POA: Diagnosis not present

## 2023-08-18 DIAGNOSIS — Z741 Need for assistance with personal care: Secondary | ICD-10-CM | POA: Diagnosis not present

## 2023-08-18 DIAGNOSIS — M549 Dorsalgia, unspecified: Secondary | ICD-10-CM | POA: Diagnosis not present

## 2023-08-19 DIAGNOSIS — F0280 Dementia in other diseases classified elsewhere without behavioral disturbance: Secondary | ICD-10-CM | POA: Diagnosis not present

## 2023-08-19 DIAGNOSIS — G309 Alzheimer's disease, unspecified: Secondary | ICD-10-CM | POA: Diagnosis not present

## 2023-08-19 DIAGNOSIS — I5032 Chronic diastolic (congestive) heart failure: Secondary | ICD-10-CM | POA: Diagnosis not present

## 2023-08-19 DIAGNOSIS — M549 Dorsalgia, unspecified: Secondary | ICD-10-CM | POA: Diagnosis not present

## 2023-08-19 DIAGNOSIS — R32 Unspecified urinary incontinence: Secondary | ICD-10-CM | POA: Diagnosis not present

## 2023-08-19 DIAGNOSIS — Z741 Need for assistance with personal care: Secondary | ICD-10-CM | POA: Diagnosis not present

## 2023-08-23 DIAGNOSIS — Z741 Need for assistance with personal care: Secondary | ICD-10-CM | POA: Diagnosis not present

## 2023-08-23 DIAGNOSIS — M549 Dorsalgia, unspecified: Secondary | ICD-10-CM | POA: Diagnosis not present

## 2023-08-23 DIAGNOSIS — Z66 Do not resuscitate: Secondary | ICD-10-CM | POA: Diagnosis not present

## 2023-08-23 DIAGNOSIS — I5032 Chronic diastolic (congestive) heart failure: Secondary | ICD-10-CM | POA: Diagnosis not present

## 2023-08-23 DIAGNOSIS — R159 Full incontinence of feces: Secondary | ICD-10-CM | POA: Diagnosis not present

## 2023-08-23 DIAGNOSIS — F0280 Dementia in other diseases classified elsewhere without behavioral disturbance: Secondary | ICD-10-CM | POA: Diagnosis not present

## 2023-08-23 DIAGNOSIS — G309 Alzheimer's disease, unspecified: Secondary | ICD-10-CM | POA: Diagnosis not present

## 2023-08-23 DIAGNOSIS — R32 Unspecified urinary incontinence: Secondary | ICD-10-CM | POA: Diagnosis not present

## 2023-08-23 DIAGNOSIS — Z515 Encounter for palliative care: Secondary | ICD-10-CM | POA: Diagnosis not present

## 2023-08-24 ENCOUNTER — Telehealth: Payer: Self-pay | Admitting: Internal Medicine

## 2023-08-24 NOTE — Telephone Encounter (Unsigned)
 Copied from CRM [id number]. Topic: General - Other >> Aug 24, 2023  9:31 AM Martinique E wrote: Reason for CRM: Ty from [facility] called regarding a fax they sent over on 3/25, and 3/31. Relayed fax number to Ty and he stated he will refax that paperwork today 4/2. Paperwork was regarding incontinent supplies. Callback number for [employer] is [phone] with any questions.

## 2023-08-25 DIAGNOSIS — M549 Dorsalgia, unspecified: Secondary | ICD-10-CM | POA: Diagnosis not present

## 2023-08-25 DIAGNOSIS — I5032 Chronic diastolic (congestive) heart failure: Secondary | ICD-10-CM | POA: Diagnosis not present

## 2023-08-25 DIAGNOSIS — F0280 Dementia in other diseases classified elsewhere without behavioral disturbance: Secondary | ICD-10-CM | POA: Diagnosis not present

## 2023-08-25 DIAGNOSIS — G309 Alzheimer's disease, unspecified: Secondary | ICD-10-CM | POA: Diagnosis not present

## 2023-08-25 DIAGNOSIS — Z741 Need for assistance with personal care: Secondary | ICD-10-CM | POA: Diagnosis not present

## 2023-08-25 DIAGNOSIS — R32 Unspecified urinary incontinence: Secondary | ICD-10-CM | POA: Diagnosis not present

## 2023-08-26 DIAGNOSIS — G309 Alzheimer's disease, unspecified: Secondary | ICD-10-CM | POA: Diagnosis not present

## 2023-08-26 DIAGNOSIS — R32 Unspecified urinary incontinence: Secondary | ICD-10-CM | POA: Diagnosis not present

## 2023-08-26 DIAGNOSIS — Z741 Need for assistance with personal care: Secondary | ICD-10-CM | POA: Diagnosis not present

## 2023-08-26 DIAGNOSIS — I5032 Chronic diastolic (congestive) heart failure: Secondary | ICD-10-CM | POA: Diagnosis not present

## 2023-08-26 DIAGNOSIS — M549 Dorsalgia, unspecified: Secondary | ICD-10-CM | POA: Diagnosis not present

## 2023-08-26 DIAGNOSIS — F0280 Dementia in other diseases classified elsewhere without behavioral disturbance: Secondary | ICD-10-CM | POA: Diagnosis not present

## 2023-08-26 NOTE — Telephone Encounter (Signed)
 Forms faxed out the morning of 08/26/23 with confirmation.

## 2023-09-01 DIAGNOSIS — M549 Dorsalgia, unspecified: Secondary | ICD-10-CM | POA: Diagnosis not present

## 2023-09-01 DIAGNOSIS — F0280 Dementia in other diseases classified elsewhere without behavioral disturbance: Secondary | ICD-10-CM | POA: Diagnosis not present

## 2023-09-01 DIAGNOSIS — R32 Unspecified urinary incontinence: Secondary | ICD-10-CM | POA: Diagnosis not present

## 2023-09-01 DIAGNOSIS — G309 Alzheimer's disease, unspecified: Secondary | ICD-10-CM | POA: Diagnosis not present

## 2023-09-01 DIAGNOSIS — Z741 Need for assistance with personal care: Secondary | ICD-10-CM | POA: Diagnosis not present

## 2023-09-01 DIAGNOSIS — I5032 Chronic diastolic (congestive) heart failure: Secondary | ICD-10-CM | POA: Diagnosis not present

## 2023-09-02 DIAGNOSIS — I5032 Chronic diastolic (congestive) heart failure: Secondary | ICD-10-CM | POA: Diagnosis not present

## 2023-09-02 DIAGNOSIS — M549 Dorsalgia, unspecified: Secondary | ICD-10-CM | POA: Diagnosis not present

## 2023-09-02 DIAGNOSIS — G309 Alzheimer's disease, unspecified: Secondary | ICD-10-CM | POA: Diagnosis not present

## 2023-09-02 DIAGNOSIS — F0280 Dementia in other diseases classified elsewhere without behavioral disturbance: Secondary | ICD-10-CM | POA: Diagnosis not present

## 2023-09-02 DIAGNOSIS — Z741 Need for assistance with personal care: Secondary | ICD-10-CM | POA: Diagnosis not present

## 2023-09-02 DIAGNOSIS — R32 Unspecified urinary incontinence: Secondary | ICD-10-CM | POA: Diagnosis not present

## 2023-09-06 DIAGNOSIS — I5032 Chronic diastolic (congestive) heart failure: Secondary | ICD-10-CM | POA: Diagnosis not present

## 2023-09-06 DIAGNOSIS — M549 Dorsalgia, unspecified: Secondary | ICD-10-CM | POA: Diagnosis not present

## 2023-09-06 DIAGNOSIS — Z741 Need for assistance with personal care: Secondary | ICD-10-CM | POA: Diagnosis not present

## 2023-09-06 DIAGNOSIS — G309 Alzheimer's disease, unspecified: Secondary | ICD-10-CM | POA: Diagnosis not present

## 2023-09-06 DIAGNOSIS — F0280 Dementia in other diseases classified elsewhere without behavioral disturbance: Secondary | ICD-10-CM | POA: Diagnosis not present

## 2023-09-06 DIAGNOSIS — R32 Unspecified urinary incontinence: Secondary | ICD-10-CM | POA: Diagnosis not present

## 2023-09-08 DIAGNOSIS — R32 Unspecified urinary incontinence: Secondary | ICD-10-CM | POA: Diagnosis not present

## 2023-09-08 DIAGNOSIS — Z741 Need for assistance with personal care: Secondary | ICD-10-CM | POA: Diagnosis not present

## 2023-09-08 DIAGNOSIS — M549 Dorsalgia, unspecified: Secondary | ICD-10-CM | POA: Diagnosis not present

## 2023-09-08 DIAGNOSIS — F0280 Dementia in other diseases classified elsewhere without behavioral disturbance: Secondary | ICD-10-CM | POA: Diagnosis not present

## 2023-09-08 DIAGNOSIS — I5032 Chronic diastolic (congestive) heart failure: Secondary | ICD-10-CM | POA: Diagnosis not present

## 2023-09-08 DIAGNOSIS — G309 Alzheimer's disease, unspecified: Secondary | ICD-10-CM | POA: Diagnosis not present

## 2023-09-09 DIAGNOSIS — R32 Unspecified urinary incontinence: Secondary | ICD-10-CM | POA: Diagnosis not present

## 2023-09-09 DIAGNOSIS — F0280 Dementia in other diseases classified elsewhere without behavioral disturbance: Secondary | ICD-10-CM | POA: Diagnosis not present

## 2023-09-09 DIAGNOSIS — I5032 Chronic diastolic (congestive) heart failure: Secondary | ICD-10-CM | POA: Diagnosis not present

## 2023-09-09 DIAGNOSIS — Z741 Need for assistance with personal care: Secondary | ICD-10-CM | POA: Diagnosis not present

## 2023-09-09 DIAGNOSIS — G309 Alzheimer's disease, unspecified: Secondary | ICD-10-CM | POA: Diagnosis not present

## 2023-09-09 DIAGNOSIS — M549 Dorsalgia, unspecified: Secondary | ICD-10-CM | POA: Diagnosis not present

## 2023-09-14 DIAGNOSIS — F0280 Dementia in other diseases classified elsewhere without behavioral disturbance: Secondary | ICD-10-CM | POA: Diagnosis not present

## 2023-09-14 DIAGNOSIS — M549 Dorsalgia, unspecified: Secondary | ICD-10-CM | POA: Diagnosis not present

## 2023-09-14 DIAGNOSIS — I5032 Chronic diastolic (congestive) heart failure: Secondary | ICD-10-CM | POA: Diagnosis not present

## 2023-09-14 DIAGNOSIS — G309 Alzheimer's disease, unspecified: Secondary | ICD-10-CM | POA: Diagnosis not present

## 2023-09-14 DIAGNOSIS — Z741 Need for assistance with personal care: Secondary | ICD-10-CM | POA: Diagnosis not present

## 2023-09-14 DIAGNOSIS — R32 Unspecified urinary incontinence: Secondary | ICD-10-CM | POA: Diagnosis not present

## 2023-09-15 DIAGNOSIS — F0280 Dementia in other diseases classified elsewhere without behavioral disturbance: Secondary | ICD-10-CM | POA: Diagnosis not present

## 2023-09-15 DIAGNOSIS — G309 Alzheimer's disease, unspecified: Secondary | ICD-10-CM | POA: Diagnosis not present

## 2023-09-15 DIAGNOSIS — R32 Unspecified urinary incontinence: Secondary | ICD-10-CM | POA: Diagnosis not present

## 2023-09-15 DIAGNOSIS — I5032 Chronic diastolic (congestive) heart failure: Secondary | ICD-10-CM | POA: Diagnosis not present

## 2023-09-15 DIAGNOSIS — M549 Dorsalgia, unspecified: Secondary | ICD-10-CM | POA: Diagnosis not present

## 2023-09-15 DIAGNOSIS — Z741 Need for assistance with personal care: Secondary | ICD-10-CM | POA: Diagnosis not present

## 2023-09-16 DIAGNOSIS — M549 Dorsalgia, unspecified: Secondary | ICD-10-CM | POA: Diagnosis not present

## 2023-09-16 DIAGNOSIS — Z741 Need for assistance with personal care: Secondary | ICD-10-CM | POA: Diagnosis not present

## 2023-09-16 DIAGNOSIS — R32 Unspecified urinary incontinence: Secondary | ICD-10-CM | POA: Diagnosis not present

## 2023-09-16 DIAGNOSIS — F0280 Dementia in other diseases classified elsewhere without behavioral disturbance: Secondary | ICD-10-CM | POA: Diagnosis not present

## 2023-09-16 DIAGNOSIS — G309 Alzheimer's disease, unspecified: Secondary | ICD-10-CM | POA: Diagnosis not present

## 2023-09-16 DIAGNOSIS — I5032 Chronic diastolic (congestive) heart failure: Secondary | ICD-10-CM | POA: Diagnosis not present

## 2023-09-19 DIAGNOSIS — I5032 Chronic diastolic (congestive) heart failure: Secondary | ICD-10-CM | POA: Diagnosis not present

## 2023-09-19 DIAGNOSIS — R32 Unspecified urinary incontinence: Secondary | ICD-10-CM | POA: Diagnosis not present

## 2023-09-19 DIAGNOSIS — M549 Dorsalgia, unspecified: Secondary | ICD-10-CM | POA: Diagnosis not present

## 2023-09-19 DIAGNOSIS — Z741 Need for assistance with personal care: Secondary | ICD-10-CM | POA: Diagnosis not present

## 2023-09-19 DIAGNOSIS — G309 Alzheimer's disease, unspecified: Secondary | ICD-10-CM | POA: Diagnosis not present

## 2023-09-19 DIAGNOSIS — F0280 Dementia in other diseases classified elsewhere without behavioral disturbance: Secondary | ICD-10-CM | POA: Diagnosis not present

## 2023-09-22 DIAGNOSIS — R32 Unspecified urinary incontinence: Secondary | ICD-10-CM | POA: Diagnosis not present

## 2023-09-22 DIAGNOSIS — Z66 Do not resuscitate: Secondary | ICD-10-CM | POA: Diagnosis not present

## 2023-09-22 DIAGNOSIS — M549 Dorsalgia, unspecified: Secondary | ICD-10-CM | POA: Diagnosis not present

## 2023-09-22 DIAGNOSIS — Z741 Need for assistance with personal care: Secondary | ICD-10-CM | POA: Diagnosis not present

## 2023-09-22 DIAGNOSIS — I5032 Chronic diastolic (congestive) heart failure: Secondary | ICD-10-CM | POA: Diagnosis not present

## 2023-09-22 DIAGNOSIS — G309 Alzheimer's disease, unspecified: Secondary | ICD-10-CM | POA: Diagnosis not present

## 2023-09-22 DIAGNOSIS — Z515 Encounter for palliative care: Secondary | ICD-10-CM | POA: Diagnosis not present

## 2023-09-22 DIAGNOSIS — R159 Full incontinence of feces: Secondary | ICD-10-CM | POA: Diagnosis not present

## 2023-09-22 DIAGNOSIS — F0280 Dementia in other diseases classified elsewhere without behavioral disturbance: Secondary | ICD-10-CM | POA: Diagnosis not present

## 2023-09-23 DIAGNOSIS — I5032 Chronic diastolic (congestive) heart failure: Secondary | ICD-10-CM | POA: Diagnosis not present

## 2023-09-23 DIAGNOSIS — M549 Dorsalgia, unspecified: Secondary | ICD-10-CM | POA: Diagnosis not present

## 2023-09-23 DIAGNOSIS — R32 Unspecified urinary incontinence: Secondary | ICD-10-CM | POA: Diagnosis not present

## 2023-09-23 DIAGNOSIS — G309 Alzheimer's disease, unspecified: Secondary | ICD-10-CM | POA: Diagnosis not present

## 2023-09-23 DIAGNOSIS — Z741 Need for assistance with personal care: Secondary | ICD-10-CM | POA: Diagnosis not present

## 2023-09-23 DIAGNOSIS — F0280 Dementia in other diseases classified elsewhere without behavioral disturbance: Secondary | ICD-10-CM | POA: Diagnosis not present

## 2023-09-29 DIAGNOSIS — I5032 Chronic diastolic (congestive) heart failure: Secondary | ICD-10-CM | POA: Diagnosis not present

## 2023-09-29 DIAGNOSIS — Z741 Need for assistance with personal care: Secondary | ICD-10-CM | POA: Diagnosis not present

## 2023-09-29 DIAGNOSIS — F0280 Dementia in other diseases classified elsewhere without behavioral disturbance: Secondary | ICD-10-CM | POA: Diagnosis not present

## 2023-09-29 DIAGNOSIS — M549 Dorsalgia, unspecified: Secondary | ICD-10-CM | POA: Diagnosis not present

## 2023-09-29 DIAGNOSIS — G309 Alzheimer's disease, unspecified: Secondary | ICD-10-CM | POA: Diagnosis not present

## 2023-09-29 DIAGNOSIS — R32 Unspecified urinary incontinence: Secondary | ICD-10-CM | POA: Diagnosis not present

## 2023-09-30 DIAGNOSIS — Z741 Need for assistance with personal care: Secondary | ICD-10-CM | POA: Diagnosis not present

## 2023-09-30 DIAGNOSIS — G309 Alzheimer's disease, unspecified: Secondary | ICD-10-CM | POA: Diagnosis not present

## 2023-09-30 DIAGNOSIS — I5032 Chronic diastolic (congestive) heart failure: Secondary | ICD-10-CM | POA: Diagnosis not present

## 2023-09-30 DIAGNOSIS — R32 Unspecified urinary incontinence: Secondary | ICD-10-CM | POA: Diagnosis not present

## 2023-09-30 DIAGNOSIS — M549 Dorsalgia, unspecified: Secondary | ICD-10-CM | POA: Diagnosis not present

## 2023-09-30 DIAGNOSIS — F0280 Dementia in other diseases classified elsewhere without behavioral disturbance: Secondary | ICD-10-CM | POA: Diagnosis not present

## 2023-10-01 DIAGNOSIS — Z741 Need for assistance with personal care: Secondary | ICD-10-CM | POA: Diagnosis not present

## 2023-10-01 DIAGNOSIS — I5032 Chronic diastolic (congestive) heart failure: Secondary | ICD-10-CM | POA: Diagnosis not present

## 2023-10-01 DIAGNOSIS — R32 Unspecified urinary incontinence: Secondary | ICD-10-CM | POA: Diagnosis not present

## 2023-10-01 DIAGNOSIS — M549 Dorsalgia, unspecified: Secondary | ICD-10-CM | POA: Diagnosis not present

## 2023-10-01 DIAGNOSIS — F0280 Dementia in other diseases classified elsewhere without behavioral disturbance: Secondary | ICD-10-CM | POA: Diagnosis not present

## 2023-10-01 DIAGNOSIS — G309 Alzheimer's disease, unspecified: Secondary | ICD-10-CM | POA: Diagnosis not present

## 2023-10-02 DIAGNOSIS — M549 Dorsalgia, unspecified: Secondary | ICD-10-CM | POA: Diagnosis not present

## 2023-10-02 DIAGNOSIS — G309 Alzheimer's disease, unspecified: Secondary | ICD-10-CM | POA: Diagnosis not present

## 2023-10-02 DIAGNOSIS — I5032 Chronic diastolic (congestive) heart failure: Secondary | ICD-10-CM | POA: Diagnosis not present

## 2023-10-02 DIAGNOSIS — Z741 Need for assistance with personal care: Secondary | ICD-10-CM | POA: Diagnosis not present

## 2023-10-02 DIAGNOSIS — R32 Unspecified urinary incontinence: Secondary | ICD-10-CM | POA: Diagnosis not present

## 2023-10-02 DIAGNOSIS — F0280 Dementia in other diseases classified elsewhere without behavioral disturbance: Secondary | ICD-10-CM | POA: Diagnosis not present

## 2023-10-05 DIAGNOSIS — I5032 Chronic diastolic (congestive) heart failure: Secondary | ICD-10-CM | POA: Diagnosis not present

## 2023-10-05 DIAGNOSIS — M549 Dorsalgia, unspecified: Secondary | ICD-10-CM | POA: Diagnosis not present

## 2023-10-05 DIAGNOSIS — F0280 Dementia in other diseases classified elsewhere without behavioral disturbance: Secondary | ICD-10-CM | POA: Diagnosis not present

## 2023-10-05 DIAGNOSIS — G309 Alzheimer's disease, unspecified: Secondary | ICD-10-CM | POA: Diagnosis not present

## 2023-10-05 DIAGNOSIS — Z741 Need for assistance with personal care: Secondary | ICD-10-CM | POA: Diagnosis not present

## 2023-10-05 DIAGNOSIS — R32 Unspecified urinary incontinence: Secondary | ICD-10-CM | POA: Diagnosis not present

## 2023-10-06 DIAGNOSIS — Z741 Need for assistance with personal care: Secondary | ICD-10-CM | POA: Diagnosis not present

## 2023-10-06 DIAGNOSIS — I5032 Chronic diastolic (congestive) heart failure: Secondary | ICD-10-CM | POA: Diagnosis not present

## 2023-10-06 DIAGNOSIS — R32 Unspecified urinary incontinence: Secondary | ICD-10-CM | POA: Diagnosis not present

## 2023-10-06 DIAGNOSIS — F0280 Dementia in other diseases classified elsewhere without behavioral disturbance: Secondary | ICD-10-CM | POA: Diagnosis not present

## 2023-10-06 DIAGNOSIS — G309 Alzheimer's disease, unspecified: Secondary | ICD-10-CM | POA: Diagnosis not present

## 2023-10-06 DIAGNOSIS — M549 Dorsalgia, unspecified: Secondary | ICD-10-CM | POA: Diagnosis not present

## 2023-10-07 DIAGNOSIS — G309 Alzheimer's disease, unspecified: Secondary | ICD-10-CM | POA: Diagnosis not present

## 2023-10-07 DIAGNOSIS — Z741 Need for assistance with personal care: Secondary | ICD-10-CM | POA: Diagnosis not present

## 2023-10-07 DIAGNOSIS — F0280 Dementia in other diseases classified elsewhere without behavioral disturbance: Secondary | ICD-10-CM | POA: Diagnosis not present

## 2023-10-07 DIAGNOSIS — M549 Dorsalgia, unspecified: Secondary | ICD-10-CM | POA: Diagnosis not present

## 2023-10-07 DIAGNOSIS — I5032 Chronic diastolic (congestive) heart failure: Secondary | ICD-10-CM | POA: Diagnosis not present

## 2023-10-07 DIAGNOSIS — R32 Unspecified urinary incontinence: Secondary | ICD-10-CM | POA: Diagnosis not present

## 2023-10-10 DIAGNOSIS — I5032 Chronic diastolic (congestive) heart failure: Secondary | ICD-10-CM | POA: Diagnosis not present

## 2023-10-10 DIAGNOSIS — M549 Dorsalgia, unspecified: Secondary | ICD-10-CM | POA: Diagnosis not present

## 2023-10-10 DIAGNOSIS — R32 Unspecified urinary incontinence: Secondary | ICD-10-CM | POA: Diagnosis not present

## 2023-10-10 DIAGNOSIS — G309 Alzheimer's disease, unspecified: Secondary | ICD-10-CM | POA: Diagnosis not present

## 2023-10-10 DIAGNOSIS — Z741 Need for assistance with personal care: Secondary | ICD-10-CM | POA: Diagnosis not present

## 2023-10-10 DIAGNOSIS — F0280 Dementia in other diseases classified elsewhere without behavioral disturbance: Secondary | ICD-10-CM | POA: Diagnosis not present

## 2023-10-13 DIAGNOSIS — G309 Alzheimer's disease, unspecified: Secondary | ICD-10-CM | POA: Diagnosis not present

## 2023-10-13 DIAGNOSIS — I5032 Chronic diastolic (congestive) heart failure: Secondary | ICD-10-CM | POA: Diagnosis not present

## 2023-10-13 DIAGNOSIS — Z741 Need for assistance with personal care: Secondary | ICD-10-CM | POA: Diagnosis not present

## 2023-10-13 DIAGNOSIS — R32 Unspecified urinary incontinence: Secondary | ICD-10-CM | POA: Diagnosis not present

## 2023-10-13 DIAGNOSIS — F0280 Dementia in other diseases classified elsewhere without behavioral disturbance: Secondary | ICD-10-CM | POA: Diagnosis not present

## 2023-10-13 DIAGNOSIS — M549 Dorsalgia, unspecified: Secondary | ICD-10-CM | POA: Diagnosis not present

## 2023-10-14 DIAGNOSIS — F0280 Dementia in other diseases classified elsewhere without behavioral disturbance: Secondary | ICD-10-CM | POA: Diagnosis not present

## 2023-10-14 DIAGNOSIS — G309 Alzheimer's disease, unspecified: Secondary | ICD-10-CM | POA: Diagnosis not present

## 2023-10-14 DIAGNOSIS — R32 Unspecified urinary incontinence: Secondary | ICD-10-CM | POA: Diagnosis not present

## 2023-10-14 DIAGNOSIS — M549 Dorsalgia, unspecified: Secondary | ICD-10-CM | POA: Diagnosis not present

## 2023-10-14 DIAGNOSIS — I5032 Chronic diastolic (congestive) heart failure: Secondary | ICD-10-CM | POA: Diagnosis not present

## 2023-10-14 DIAGNOSIS — Z741 Need for assistance with personal care: Secondary | ICD-10-CM | POA: Diagnosis not present

## 2023-10-19 DIAGNOSIS — Z741 Need for assistance with personal care: Secondary | ICD-10-CM | POA: Diagnosis not present

## 2023-10-19 DIAGNOSIS — R32 Unspecified urinary incontinence: Secondary | ICD-10-CM | POA: Diagnosis not present

## 2023-10-19 DIAGNOSIS — G309 Alzheimer's disease, unspecified: Secondary | ICD-10-CM | POA: Diagnosis not present

## 2023-10-19 DIAGNOSIS — F0280 Dementia in other diseases classified elsewhere without behavioral disturbance: Secondary | ICD-10-CM | POA: Diagnosis not present

## 2023-10-19 DIAGNOSIS — I5032 Chronic diastolic (congestive) heart failure: Secondary | ICD-10-CM | POA: Diagnosis not present

## 2023-10-19 DIAGNOSIS — M549 Dorsalgia, unspecified: Secondary | ICD-10-CM | POA: Diagnosis not present

## 2023-10-20 DIAGNOSIS — I5032 Chronic diastolic (congestive) heart failure: Secondary | ICD-10-CM | POA: Diagnosis not present

## 2023-10-20 DIAGNOSIS — R32 Unspecified urinary incontinence: Secondary | ICD-10-CM | POA: Diagnosis not present

## 2023-10-20 DIAGNOSIS — G309 Alzheimer's disease, unspecified: Secondary | ICD-10-CM | POA: Diagnosis not present

## 2023-10-20 DIAGNOSIS — F0280 Dementia in other diseases classified elsewhere without behavioral disturbance: Secondary | ICD-10-CM | POA: Diagnosis not present

## 2023-10-20 DIAGNOSIS — Z741 Need for assistance with personal care: Secondary | ICD-10-CM | POA: Diagnosis not present

## 2023-10-20 DIAGNOSIS — M549 Dorsalgia, unspecified: Secondary | ICD-10-CM | POA: Diagnosis not present

## 2023-10-21 DIAGNOSIS — G309 Alzheimer's disease, unspecified: Secondary | ICD-10-CM | POA: Diagnosis not present

## 2023-10-21 DIAGNOSIS — R32 Unspecified urinary incontinence: Secondary | ICD-10-CM | POA: Diagnosis not present

## 2023-10-21 DIAGNOSIS — Z741 Need for assistance with personal care: Secondary | ICD-10-CM | POA: Diagnosis not present

## 2023-10-21 DIAGNOSIS — F0280 Dementia in other diseases classified elsewhere without behavioral disturbance: Secondary | ICD-10-CM | POA: Diagnosis not present

## 2023-10-21 DIAGNOSIS — I5032 Chronic diastolic (congestive) heart failure: Secondary | ICD-10-CM | POA: Diagnosis not present

## 2023-10-21 DIAGNOSIS — M549 Dorsalgia, unspecified: Secondary | ICD-10-CM | POA: Diagnosis not present

## 2023-10-23 ENCOUNTER — Other Ambulatory Visit: Payer: Self-pay | Admitting: Internal Medicine

## 2023-10-23 DIAGNOSIS — R159 Full incontinence of feces: Secondary | ICD-10-CM | POA: Diagnosis not present

## 2023-10-23 DIAGNOSIS — G309 Alzheimer's disease, unspecified: Secondary | ICD-10-CM | POA: Diagnosis not present

## 2023-10-23 DIAGNOSIS — Z741 Need for assistance with personal care: Secondary | ICD-10-CM | POA: Diagnosis not present

## 2023-10-23 DIAGNOSIS — I5032 Chronic diastolic (congestive) heart failure: Secondary | ICD-10-CM | POA: Diagnosis not present

## 2023-10-23 DIAGNOSIS — F0280 Dementia in other diseases classified elsewhere without behavioral disturbance: Secondary | ICD-10-CM | POA: Diagnosis not present

## 2023-10-23 DIAGNOSIS — R32 Unspecified urinary incontinence: Secondary | ICD-10-CM | POA: Diagnosis not present

## 2023-10-23 DIAGNOSIS — M549 Dorsalgia, unspecified: Secondary | ICD-10-CM | POA: Diagnosis not present

## 2023-10-23 DIAGNOSIS — Z515 Encounter for palliative care: Secondary | ICD-10-CM | POA: Diagnosis not present

## 2023-10-23 DIAGNOSIS — Z66 Do not resuscitate: Secondary | ICD-10-CM | POA: Diagnosis not present

## 2023-10-24 ENCOUNTER — Other Ambulatory Visit: Payer: Self-pay | Admitting: Internal Medicine

## 2023-10-24 ENCOUNTER — Telehealth: Payer: Self-pay

## 2023-10-24 ENCOUNTER — Other Ambulatory Visit (HOSPITAL_COMMUNITY): Payer: Self-pay

## 2023-10-24 DIAGNOSIS — R32 Unspecified urinary incontinence: Secondary | ICD-10-CM | POA: Diagnosis not present

## 2023-10-24 DIAGNOSIS — Z741 Need for assistance with personal care: Secondary | ICD-10-CM | POA: Diagnosis not present

## 2023-10-24 DIAGNOSIS — F0280 Dementia in other diseases classified elsewhere without behavioral disturbance: Secondary | ICD-10-CM | POA: Diagnosis not present

## 2023-10-24 DIAGNOSIS — I5032 Chronic diastolic (congestive) heart failure: Secondary | ICD-10-CM | POA: Diagnosis not present

## 2023-10-24 DIAGNOSIS — M549 Dorsalgia, unspecified: Secondary | ICD-10-CM | POA: Diagnosis not present

## 2023-10-24 DIAGNOSIS — G309 Alzheimer's disease, unspecified: Secondary | ICD-10-CM | POA: Diagnosis not present

## 2023-10-24 NOTE — Telephone Encounter (Signed)
 Pharmacy Patient Advocate Encounter   Received notification from Patient Pharmacy that prior authorization for Memantine  10mg  tabs is required/requested.   Insurance verification completed.   The patient is insured through [facility] .   Per test claim: PA required; PA submitted to above mentioned insurance via CoverMyMeds Key/confirmation #/EOC [id number] Status is pending

## 2023-10-24 NOTE — Telephone Encounter (Signed)
 Pharmacy Patient Advocate Encounter  Received notification from [employer] that Prior Authorization for Memantine  10mg  tabs has been CANCELLED due to a prior authorization is not required.  Per test claim, copay came back as $1.60 with [facility].   Spoke with [facility] and they were trying to bill to Hospice and it was rejecting.

## 2023-10-24 NOTE — Telephone Encounter (Signed)
 PA has been submitted and documented in separate encounter, please sign off on rx in this encounter as PA team is unable to resolve RX requests. Thank you

## 2023-10-27 DIAGNOSIS — G309 Alzheimer's disease, unspecified: Secondary | ICD-10-CM | POA: Diagnosis not present

## 2023-10-27 DIAGNOSIS — M549 Dorsalgia, unspecified: Secondary | ICD-10-CM | POA: Diagnosis not present

## 2023-10-27 DIAGNOSIS — Z741 Need for assistance with personal care: Secondary | ICD-10-CM | POA: Diagnosis not present

## 2023-10-27 DIAGNOSIS — I5032 Chronic diastolic (congestive) heart failure: Secondary | ICD-10-CM | POA: Diagnosis not present

## 2023-10-27 DIAGNOSIS — F0280 Dementia in other diseases classified elsewhere without behavioral disturbance: Secondary | ICD-10-CM | POA: Diagnosis not present

## 2023-10-27 DIAGNOSIS — R32 Unspecified urinary incontinence: Secondary | ICD-10-CM | POA: Diagnosis not present

## 2023-10-29 DIAGNOSIS — M549 Dorsalgia, unspecified: Secondary | ICD-10-CM | POA: Diagnosis not present

## 2023-10-29 DIAGNOSIS — R32 Unspecified urinary incontinence: Secondary | ICD-10-CM | POA: Diagnosis not present

## 2023-10-29 DIAGNOSIS — G309 Alzheimer's disease, unspecified: Secondary | ICD-10-CM | POA: Diagnosis not present

## 2023-10-29 DIAGNOSIS — I5032 Chronic diastolic (congestive) heart failure: Secondary | ICD-10-CM | POA: Diagnosis not present

## 2023-10-29 DIAGNOSIS — F0280 Dementia in other diseases classified elsewhere without behavioral disturbance: Secondary | ICD-10-CM | POA: Diagnosis not present

## 2023-10-29 DIAGNOSIS — Z741 Need for assistance with personal care: Secondary | ICD-10-CM | POA: Diagnosis not present

## 2023-10-31 DIAGNOSIS — M549 Dorsalgia, unspecified: Secondary | ICD-10-CM | POA: Diagnosis not present

## 2023-10-31 DIAGNOSIS — R32 Unspecified urinary incontinence: Secondary | ICD-10-CM | POA: Diagnosis not present

## 2023-10-31 DIAGNOSIS — I5032 Chronic diastolic (congestive) heart failure: Secondary | ICD-10-CM | POA: Diagnosis not present

## 2023-10-31 DIAGNOSIS — G309 Alzheimer's disease, unspecified: Secondary | ICD-10-CM | POA: Diagnosis not present

## 2023-10-31 DIAGNOSIS — F0280 Dementia in other diseases classified elsewhere without behavioral disturbance: Secondary | ICD-10-CM | POA: Diagnosis not present

## 2023-10-31 DIAGNOSIS — Z741 Need for assistance with personal care: Secondary | ICD-10-CM | POA: Diagnosis not present

## 2023-11-04 DIAGNOSIS — R32 Unspecified urinary incontinence: Secondary | ICD-10-CM | POA: Diagnosis not present

## 2023-11-04 DIAGNOSIS — F0280 Dementia in other diseases classified elsewhere without behavioral disturbance: Secondary | ICD-10-CM | POA: Diagnosis not present

## 2023-11-04 DIAGNOSIS — I5032 Chronic diastolic (congestive) heart failure: Secondary | ICD-10-CM | POA: Diagnosis not present

## 2023-11-04 DIAGNOSIS — M549 Dorsalgia, unspecified: Secondary | ICD-10-CM | POA: Diagnosis not present

## 2023-11-04 DIAGNOSIS — G309 Alzheimer's disease, unspecified: Secondary | ICD-10-CM | POA: Diagnosis not present

## 2023-11-04 DIAGNOSIS — Z741 Need for assistance with personal care: Secondary | ICD-10-CM | POA: Diagnosis not present

## 2023-11-07 DIAGNOSIS — I5032 Chronic diastolic (congestive) heart failure: Secondary | ICD-10-CM | POA: Diagnosis not present

## 2023-11-07 DIAGNOSIS — G309 Alzheimer's disease, unspecified: Secondary | ICD-10-CM | POA: Diagnosis not present

## 2023-11-07 DIAGNOSIS — M549 Dorsalgia, unspecified: Secondary | ICD-10-CM | POA: Diagnosis not present

## 2023-11-07 DIAGNOSIS — F0280 Dementia in other diseases classified elsewhere without behavioral disturbance: Secondary | ICD-10-CM | POA: Diagnosis not present

## 2023-11-07 DIAGNOSIS — Z741 Need for assistance with personal care: Secondary | ICD-10-CM | POA: Diagnosis not present

## 2023-11-07 DIAGNOSIS — R32 Unspecified urinary incontinence: Secondary | ICD-10-CM | POA: Diagnosis not present

## 2023-11-10 DIAGNOSIS — Z741 Need for assistance with personal care: Secondary | ICD-10-CM | POA: Diagnosis not present

## 2023-11-10 DIAGNOSIS — I5032 Chronic diastolic (congestive) heart failure: Secondary | ICD-10-CM | POA: Diagnosis not present

## 2023-11-10 DIAGNOSIS — G309 Alzheimer's disease, unspecified: Secondary | ICD-10-CM | POA: Diagnosis not present

## 2023-11-10 DIAGNOSIS — R32 Unspecified urinary incontinence: Secondary | ICD-10-CM | POA: Diagnosis not present

## 2023-11-10 DIAGNOSIS — F0280 Dementia in other diseases classified elsewhere without behavioral disturbance: Secondary | ICD-10-CM | POA: Diagnosis not present

## 2023-11-10 DIAGNOSIS — M549 Dorsalgia, unspecified: Secondary | ICD-10-CM | POA: Diagnosis not present

## 2023-11-11 DIAGNOSIS — R32 Unspecified urinary incontinence: Secondary | ICD-10-CM | POA: Diagnosis not present

## 2023-11-11 DIAGNOSIS — M549 Dorsalgia, unspecified: Secondary | ICD-10-CM | POA: Diagnosis not present

## 2023-11-11 DIAGNOSIS — G309 Alzheimer's disease, unspecified: Secondary | ICD-10-CM | POA: Diagnosis not present

## 2023-11-11 DIAGNOSIS — I5032 Chronic diastolic (congestive) heart failure: Secondary | ICD-10-CM | POA: Diagnosis not present

## 2023-11-11 DIAGNOSIS — Z741 Need for assistance with personal care: Secondary | ICD-10-CM | POA: Diagnosis not present

## 2023-11-11 DIAGNOSIS — F0280 Dementia in other diseases classified elsewhere without behavioral disturbance: Secondary | ICD-10-CM | POA: Diagnosis not present

## 2023-11-14 DIAGNOSIS — M549 Dorsalgia, unspecified: Secondary | ICD-10-CM | POA: Diagnosis not present

## 2023-11-14 DIAGNOSIS — G309 Alzheimer's disease, unspecified: Secondary | ICD-10-CM | POA: Diagnosis not present

## 2023-11-14 DIAGNOSIS — F0280 Dementia in other diseases classified elsewhere without behavioral disturbance: Secondary | ICD-10-CM | POA: Diagnosis not present

## 2023-11-14 DIAGNOSIS — I5032 Chronic diastolic (congestive) heart failure: Secondary | ICD-10-CM | POA: Diagnosis not present

## 2023-11-14 DIAGNOSIS — R32 Unspecified urinary incontinence: Secondary | ICD-10-CM | POA: Diagnosis not present

## 2023-11-14 DIAGNOSIS — Z741 Need for assistance with personal care: Secondary | ICD-10-CM | POA: Diagnosis not present

## 2023-11-17 DIAGNOSIS — M549 Dorsalgia, unspecified: Secondary | ICD-10-CM | POA: Diagnosis not present

## 2023-11-17 DIAGNOSIS — F0280 Dementia in other diseases classified elsewhere without behavioral disturbance: Secondary | ICD-10-CM | POA: Diagnosis not present

## 2023-11-17 DIAGNOSIS — G309 Alzheimer's disease, unspecified: Secondary | ICD-10-CM | POA: Diagnosis not present

## 2023-11-17 DIAGNOSIS — R32 Unspecified urinary incontinence: Secondary | ICD-10-CM | POA: Diagnosis not present

## 2023-11-17 DIAGNOSIS — Z741 Need for assistance with personal care: Secondary | ICD-10-CM | POA: Diagnosis not present

## 2023-11-17 DIAGNOSIS — I5032 Chronic diastolic (congestive) heart failure: Secondary | ICD-10-CM | POA: Diagnosis not present

## 2023-11-18 DIAGNOSIS — M549 Dorsalgia, unspecified: Secondary | ICD-10-CM | POA: Diagnosis not present

## 2023-11-18 DIAGNOSIS — G309 Alzheimer's disease, unspecified: Secondary | ICD-10-CM | POA: Diagnosis not present

## 2023-11-18 DIAGNOSIS — I5032 Chronic diastolic (congestive) heart failure: Secondary | ICD-10-CM | POA: Diagnosis not present

## 2023-11-18 DIAGNOSIS — F0280 Dementia in other diseases classified elsewhere without behavioral disturbance: Secondary | ICD-10-CM | POA: Diagnosis not present

## 2023-11-18 DIAGNOSIS — R32 Unspecified urinary incontinence: Secondary | ICD-10-CM | POA: Diagnosis not present

## 2023-11-18 DIAGNOSIS — Z741 Need for assistance with personal care: Secondary | ICD-10-CM | POA: Diagnosis not present

## 2023-11-21 DIAGNOSIS — I5032 Chronic diastolic (congestive) heart failure: Secondary | ICD-10-CM | POA: Diagnosis not present

## 2023-11-21 DIAGNOSIS — M549 Dorsalgia, unspecified: Secondary | ICD-10-CM | POA: Diagnosis not present

## 2023-11-21 DIAGNOSIS — G309 Alzheimer's disease, unspecified: Secondary | ICD-10-CM | POA: Diagnosis not present

## 2023-11-21 DIAGNOSIS — Z741 Need for assistance with personal care: Secondary | ICD-10-CM | POA: Diagnosis not present

## 2023-11-21 DIAGNOSIS — R32 Unspecified urinary incontinence: Secondary | ICD-10-CM | POA: Diagnosis not present

## 2023-11-21 DIAGNOSIS — F0280 Dementia in other diseases classified elsewhere without behavioral disturbance: Secondary | ICD-10-CM | POA: Diagnosis not present

## 2023-11-22 DIAGNOSIS — Z515 Encounter for palliative care: Secondary | ICD-10-CM | POA: Diagnosis not present

## 2023-11-22 DIAGNOSIS — R159 Full incontinence of feces: Secondary | ICD-10-CM | POA: Diagnosis not present

## 2023-11-22 DIAGNOSIS — F0280 Dementia in other diseases classified elsewhere without behavioral disturbance: Secondary | ICD-10-CM | POA: Diagnosis not present

## 2023-11-22 DIAGNOSIS — M549 Dorsalgia, unspecified: Secondary | ICD-10-CM | POA: Diagnosis not present

## 2023-11-22 DIAGNOSIS — Z66 Do not resuscitate: Secondary | ICD-10-CM | POA: Diagnosis not present

## 2023-11-22 DIAGNOSIS — I5032 Chronic diastolic (congestive) heart failure: Secondary | ICD-10-CM | POA: Diagnosis not present

## 2023-11-22 DIAGNOSIS — Z741 Need for assistance with personal care: Secondary | ICD-10-CM | POA: Diagnosis not present

## 2023-11-22 DIAGNOSIS — G309 Alzheimer's disease, unspecified: Secondary | ICD-10-CM | POA: Diagnosis not present

## 2023-11-22 DIAGNOSIS — R32 Unspecified urinary incontinence: Secondary | ICD-10-CM | POA: Diagnosis not present

## 2023-11-24 DIAGNOSIS — M549 Dorsalgia, unspecified: Secondary | ICD-10-CM | POA: Diagnosis not present

## 2023-11-24 DIAGNOSIS — G309 Alzheimer's disease, unspecified: Secondary | ICD-10-CM | POA: Diagnosis not present

## 2023-11-24 DIAGNOSIS — F0280 Dementia in other diseases classified elsewhere without behavioral disturbance: Secondary | ICD-10-CM | POA: Diagnosis not present

## 2023-11-24 DIAGNOSIS — R32 Unspecified urinary incontinence: Secondary | ICD-10-CM | POA: Diagnosis not present

## 2023-11-24 DIAGNOSIS — Z741 Need for assistance with personal care: Secondary | ICD-10-CM | POA: Diagnosis not present

## 2023-11-24 DIAGNOSIS — I5032 Chronic diastolic (congestive) heart failure: Secondary | ICD-10-CM | POA: Diagnosis not present

## 2023-11-26 DIAGNOSIS — R32 Unspecified urinary incontinence: Secondary | ICD-10-CM | POA: Diagnosis not present

## 2023-11-26 DIAGNOSIS — F0280 Dementia in other diseases classified elsewhere without behavioral disturbance: Secondary | ICD-10-CM | POA: Diagnosis not present

## 2023-11-26 DIAGNOSIS — G309 Alzheimer's disease, unspecified: Secondary | ICD-10-CM | POA: Diagnosis not present

## 2023-11-26 DIAGNOSIS — M549 Dorsalgia, unspecified: Secondary | ICD-10-CM | POA: Diagnosis not present

## 2023-11-26 DIAGNOSIS — I5032 Chronic diastolic (congestive) heart failure: Secondary | ICD-10-CM | POA: Diagnosis not present

## 2023-11-26 DIAGNOSIS — Z741 Need for assistance with personal care: Secondary | ICD-10-CM | POA: Diagnosis not present

## 2023-11-28 DIAGNOSIS — Z741 Need for assistance with personal care: Secondary | ICD-10-CM | POA: Diagnosis not present

## 2023-11-28 DIAGNOSIS — G309 Alzheimer's disease, unspecified: Secondary | ICD-10-CM | POA: Diagnosis not present

## 2023-11-28 DIAGNOSIS — F0280 Dementia in other diseases classified elsewhere without behavioral disturbance: Secondary | ICD-10-CM | POA: Diagnosis not present

## 2023-11-28 DIAGNOSIS — M549 Dorsalgia, unspecified: Secondary | ICD-10-CM | POA: Diagnosis not present

## 2023-11-28 DIAGNOSIS — I5032 Chronic diastolic (congestive) heart failure: Secondary | ICD-10-CM | POA: Diagnosis not present

## 2023-11-28 DIAGNOSIS — R32 Unspecified urinary incontinence: Secondary | ICD-10-CM | POA: Diagnosis not present

## 2023-12-01 DIAGNOSIS — G309 Alzheimer's disease, unspecified: Secondary | ICD-10-CM | POA: Diagnosis not present

## 2023-12-01 DIAGNOSIS — I5032 Chronic diastolic (congestive) heart failure: Secondary | ICD-10-CM | POA: Diagnosis not present

## 2023-12-01 DIAGNOSIS — Z741 Need for assistance with personal care: Secondary | ICD-10-CM | POA: Diagnosis not present

## 2023-12-01 DIAGNOSIS — M549 Dorsalgia, unspecified: Secondary | ICD-10-CM | POA: Diagnosis not present

## 2023-12-01 DIAGNOSIS — R32 Unspecified urinary incontinence: Secondary | ICD-10-CM | POA: Diagnosis not present

## 2023-12-01 DIAGNOSIS — F0280 Dementia in other diseases classified elsewhere without behavioral disturbance: Secondary | ICD-10-CM | POA: Diagnosis not present

## 2023-12-02 DIAGNOSIS — M549 Dorsalgia, unspecified: Secondary | ICD-10-CM | POA: Diagnosis not present

## 2023-12-02 DIAGNOSIS — F0280 Dementia in other diseases classified elsewhere without behavioral disturbance: Secondary | ICD-10-CM | POA: Diagnosis not present

## 2023-12-02 DIAGNOSIS — G309 Alzheimer's disease, unspecified: Secondary | ICD-10-CM | POA: Diagnosis not present

## 2023-12-02 DIAGNOSIS — R32 Unspecified urinary incontinence: Secondary | ICD-10-CM | POA: Diagnosis not present

## 2023-12-02 DIAGNOSIS — Z741 Need for assistance with personal care: Secondary | ICD-10-CM | POA: Diagnosis not present

## 2023-12-02 DIAGNOSIS — I5032 Chronic diastolic (congestive) heart failure: Secondary | ICD-10-CM | POA: Diagnosis not present

## 2023-12-05 DIAGNOSIS — G309 Alzheimer's disease, unspecified: Secondary | ICD-10-CM | POA: Diagnosis not present

## 2023-12-05 DIAGNOSIS — R32 Unspecified urinary incontinence: Secondary | ICD-10-CM | POA: Diagnosis not present

## 2023-12-05 DIAGNOSIS — Z741 Need for assistance with personal care: Secondary | ICD-10-CM | POA: Diagnosis not present

## 2023-12-05 DIAGNOSIS — M549 Dorsalgia, unspecified: Secondary | ICD-10-CM | POA: Diagnosis not present

## 2023-12-05 DIAGNOSIS — I5032 Chronic diastolic (congestive) heart failure: Secondary | ICD-10-CM | POA: Diagnosis not present

## 2023-12-05 DIAGNOSIS — F0280 Dementia in other diseases classified elsewhere without behavioral disturbance: Secondary | ICD-10-CM | POA: Diagnosis not present

## 2023-12-08 DIAGNOSIS — Z741 Need for assistance with personal care: Secondary | ICD-10-CM | POA: Diagnosis not present

## 2023-12-08 DIAGNOSIS — G309 Alzheimer's disease, unspecified: Secondary | ICD-10-CM | POA: Diagnosis not present

## 2023-12-08 DIAGNOSIS — R32 Unspecified urinary incontinence: Secondary | ICD-10-CM | POA: Diagnosis not present

## 2023-12-08 DIAGNOSIS — I5032 Chronic diastolic (congestive) heart failure: Secondary | ICD-10-CM | POA: Diagnosis not present

## 2023-12-08 DIAGNOSIS — M549 Dorsalgia, unspecified: Secondary | ICD-10-CM | POA: Diagnosis not present

## 2023-12-08 DIAGNOSIS — F0280 Dementia in other diseases classified elsewhere without behavioral disturbance: Secondary | ICD-10-CM | POA: Diagnosis not present

## 2023-12-09 DIAGNOSIS — Z741 Need for assistance with personal care: Secondary | ICD-10-CM | POA: Diagnosis not present

## 2023-12-09 DIAGNOSIS — M549 Dorsalgia, unspecified: Secondary | ICD-10-CM | POA: Diagnosis not present

## 2023-12-09 DIAGNOSIS — G309 Alzheimer's disease, unspecified: Secondary | ICD-10-CM | POA: Diagnosis not present

## 2023-12-09 DIAGNOSIS — I5032 Chronic diastolic (congestive) heart failure: Secondary | ICD-10-CM | POA: Diagnosis not present

## 2023-12-09 DIAGNOSIS — F0280 Dementia in other diseases classified elsewhere without behavioral disturbance: Secondary | ICD-10-CM | POA: Diagnosis not present

## 2023-12-09 DIAGNOSIS — R32 Unspecified urinary incontinence: Secondary | ICD-10-CM | POA: Diagnosis not present

## 2023-12-10 DIAGNOSIS — F0280 Dementia in other diseases classified elsewhere without behavioral disturbance: Secondary | ICD-10-CM | POA: Diagnosis not present

## 2023-12-10 DIAGNOSIS — I5032 Chronic diastolic (congestive) heart failure: Secondary | ICD-10-CM | POA: Diagnosis not present

## 2023-12-10 DIAGNOSIS — Z741 Need for assistance with personal care: Secondary | ICD-10-CM | POA: Diagnosis not present

## 2023-12-10 DIAGNOSIS — G309 Alzheimer's disease, unspecified: Secondary | ICD-10-CM | POA: Diagnosis not present

## 2023-12-10 DIAGNOSIS — M549 Dorsalgia, unspecified: Secondary | ICD-10-CM | POA: Diagnosis not present

## 2023-12-10 DIAGNOSIS — R32 Unspecified urinary incontinence: Secondary | ICD-10-CM | POA: Diagnosis not present

## 2023-12-12 DIAGNOSIS — M549 Dorsalgia, unspecified: Secondary | ICD-10-CM | POA: Diagnosis not present

## 2023-12-12 DIAGNOSIS — I5032 Chronic diastolic (congestive) heart failure: Secondary | ICD-10-CM | POA: Diagnosis not present

## 2023-12-12 DIAGNOSIS — G309 Alzheimer's disease, unspecified: Secondary | ICD-10-CM | POA: Diagnosis not present

## 2023-12-12 DIAGNOSIS — R32 Unspecified urinary incontinence: Secondary | ICD-10-CM | POA: Diagnosis not present

## 2023-12-12 DIAGNOSIS — F0280 Dementia in other diseases classified elsewhere without behavioral disturbance: Secondary | ICD-10-CM | POA: Diagnosis not present

## 2023-12-12 DIAGNOSIS — Z741 Need for assistance with personal care: Secondary | ICD-10-CM | POA: Diagnosis not present

## 2023-12-15 DIAGNOSIS — I5032 Chronic diastolic (congestive) heart failure: Secondary | ICD-10-CM | POA: Diagnosis not present

## 2023-12-15 DIAGNOSIS — Z741 Need for assistance with personal care: Secondary | ICD-10-CM | POA: Diagnosis not present

## 2023-12-15 DIAGNOSIS — F0280 Dementia in other diseases classified elsewhere without behavioral disturbance: Secondary | ICD-10-CM | POA: Diagnosis not present

## 2023-12-15 DIAGNOSIS — R32 Unspecified urinary incontinence: Secondary | ICD-10-CM | POA: Diagnosis not present

## 2023-12-15 DIAGNOSIS — G309 Alzheimer's disease, unspecified: Secondary | ICD-10-CM | POA: Diagnosis not present

## 2023-12-15 DIAGNOSIS — M549 Dorsalgia, unspecified: Secondary | ICD-10-CM | POA: Diagnosis not present

## 2023-12-19 DIAGNOSIS — G309 Alzheimer's disease, unspecified: Secondary | ICD-10-CM | POA: Diagnosis not present

## 2023-12-19 DIAGNOSIS — R32 Unspecified urinary incontinence: Secondary | ICD-10-CM | POA: Diagnosis not present

## 2023-12-19 DIAGNOSIS — Z741 Need for assistance with personal care: Secondary | ICD-10-CM | POA: Diagnosis not present

## 2023-12-19 DIAGNOSIS — M549 Dorsalgia, unspecified: Secondary | ICD-10-CM | POA: Diagnosis not present

## 2023-12-19 DIAGNOSIS — F0280 Dementia in other diseases classified elsewhere without behavioral disturbance: Secondary | ICD-10-CM | POA: Diagnosis not present

## 2023-12-19 DIAGNOSIS — I5032 Chronic diastolic (congestive) heart failure: Secondary | ICD-10-CM | POA: Diagnosis not present

## 2023-12-21 DIAGNOSIS — F0280 Dementia in other diseases classified elsewhere without behavioral disturbance: Secondary | ICD-10-CM | POA: Diagnosis not present

## 2023-12-21 DIAGNOSIS — I5032 Chronic diastolic (congestive) heart failure: Secondary | ICD-10-CM | POA: Diagnosis not present

## 2023-12-21 DIAGNOSIS — G309 Alzheimer's disease, unspecified: Secondary | ICD-10-CM | POA: Diagnosis not present

## 2023-12-21 DIAGNOSIS — R32 Unspecified urinary incontinence: Secondary | ICD-10-CM | POA: Diagnosis not present

## 2023-12-21 DIAGNOSIS — Z741 Need for assistance with personal care: Secondary | ICD-10-CM | POA: Diagnosis not present

## 2023-12-21 DIAGNOSIS — M549 Dorsalgia, unspecified: Secondary | ICD-10-CM | POA: Diagnosis not present

## 2023-12-22 DIAGNOSIS — F0280 Dementia in other diseases classified elsewhere without behavioral disturbance: Secondary | ICD-10-CM | POA: Diagnosis not present

## 2023-12-22 DIAGNOSIS — Z741 Need for assistance with personal care: Secondary | ICD-10-CM | POA: Diagnosis not present

## 2023-12-22 DIAGNOSIS — I5032 Chronic diastolic (congestive) heart failure: Secondary | ICD-10-CM | POA: Diagnosis not present

## 2023-12-22 DIAGNOSIS — M549 Dorsalgia, unspecified: Secondary | ICD-10-CM | POA: Diagnosis not present

## 2023-12-22 DIAGNOSIS — R32 Unspecified urinary incontinence: Secondary | ICD-10-CM | POA: Diagnosis not present

## 2023-12-22 DIAGNOSIS — G309 Alzheimer's disease, unspecified: Secondary | ICD-10-CM | POA: Diagnosis not present

## 2023-12-23 DIAGNOSIS — M549 Dorsalgia, unspecified: Secondary | ICD-10-CM | POA: Diagnosis not present

## 2023-12-23 DIAGNOSIS — F0280 Dementia in other diseases classified elsewhere without behavioral disturbance: Secondary | ICD-10-CM | POA: Diagnosis not present

## 2023-12-23 DIAGNOSIS — R159 Full incontinence of feces: Secondary | ICD-10-CM | POA: Diagnosis not present

## 2023-12-23 DIAGNOSIS — Z741 Need for assistance with personal care: Secondary | ICD-10-CM | POA: Diagnosis not present

## 2023-12-23 DIAGNOSIS — R32 Unspecified urinary incontinence: Secondary | ICD-10-CM | POA: Diagnosis not present

## 2023-12-23 DIAGNOSIS — Z515 Encounter for palliative care: Secondary | ICD-10-CM | POA: Diagnosis not present

## 2023-12-23 DIAGNOSIS — I5032 Chronic diastolic (congestive) heart failure: Secondary | ICD-10-CM | POA: Diagnosis not present

## 2023-12-23 DIAGNOSIS — G309 Alzheimer's disease, unspecified: Secondary | ICD-10-CM | POA: Diagnosis not present

## 2023-12-23 DIAGNOSIS — Z66 Do not resuscitate: Secondary | ICD-10-CM | POA: Diagnosis not present

## 2023-12-26 DIAGNOSIS — F0280 Dementia in other diseases classified elsewhere without behavioral disturbance: Secondary | ICD-10-CM | POA: Diagnosis not present

## 2023-12-26 DIAGNOSIS — G309 Alzheimer's disease, unspecified: Secondary | ICD-10-CM | POA: Diagnosis not present

## 2023-12-26 DIAGNOSIS — M549 Dorsalgia, unspecified: Secondary | ICD-10-CM | POA: Diagnosis not present

## 2023-12-26 DIAGNOSIS — Z741 Need for assistance with personal care: Secondary | ICD-10-CM | POA: Diagnosis not present

## 2023-12-26 DIAGNOSIS — R32 Unspecified urinary incontinence: Secondary | ICD-10-CM | POA: Diagnosis not present

## 2023-12-26 DIAGNOSIS — I5032 Chronic diastolic (congestive) heart failure: Secondary | ICD-10-CM | POA: Diagnosis not present

## 2023-12-29 DIAGNOSIS — F0280 Dementia in other diseases classified elsewhere without behavioral disturbance: Secondary | ICD-10-CM | POA: Diagnosis not present

## 2023-12-29 DIAGNOSIS — Z741 Need for assistance with personal care: Secondary | ICD-10-CM | POA: Diagnosis not present

## 2023-12-29 DIAGNOSIS — M549 Dorsalgia, unspecified: Secondary | ICD-10-CM | POA: Diagnosis not present

## 2023-12-29 DIAGNOSIS — I5032 Chronic diastolic (congestive) heart failure: Secondary | ICD-10-CM | POA: Diagnosis not present

## 2023-12-29 DIAGNOSIS — R32 Unspecified urinary incontinence: Secondary | ICD-10-CM | POA: Diagnosis not present

## 2023-12-29 DIAGNOSIS — G309 Alzheimer's disease, unspecified: Secondary | ICD-10-CM | POA: Diagnosis not present

## 2023-12-30 DIAGNOSIS — F0280 Dementia in other diseases classified elsewhere without behavioral disturbance: Secondary | ICD-10-CM | POA: Diagnosis not present

## 2023-12-30 DIAGNOSIS — M549 Dorsalgia, unspecified: Secondary | ICD-10-CM | POA: Diagnosis not present

## 2023-12-30 DIAGNOSIS — I5032 Chronic diastolic (congestive) heart failure: Secondary | ICD-10-CM | POA: Diagnosis not present

## 2023-12-30 DIAGNOSIS — G309 Alzheimer's disease, unspecified: Secondary | ICD-10-CM | POA: Diagnosis not present

## 2023-12-30 DIAGNOSIS — Z741 Need for assistance with personal care: Secondary | ICD-10-CM | POA: Diagnosis not present

## 2023-12-30 DIAGNOSIS — R32 Unspecified urinary incontinence: Secondary | ICD-10-CM | POA: Diagnosis not present

## 2024-01-02 DIAGNOSIS — F0280 Dementia in other diseases classified elsewhere without behavioral disturbance: Secondary | ICD-10-CM | POA: Diagnosis not present

## 2024-01-02 DIAGNOSIS — R32 Unspecified urinary incontinence: Secondary | ICD-10-CM | POA: Diagnosis not present

## 2024-01-02 DIAGNOSIS — M549 Dorsalgia, unspecified: Secondary | ICD-10-CM | POA: Diagnosis not present

## 2024-01-02 DIAGNOSIS — I5032 Chronic diastolic (congestive) heart failure: Secondary | ICD-10-CM | POA: Diagnosis not present

## 2024-01-02 DIAGNOSIS — Z741 Need for assistance with personal care: Secondary | ICD-10-CM | POA: Diagnosis not present

## 2024-01-02 DIAGNOSIS — G309 Alzheimer's disease, unspecified: Secondary | ICD-10-CM | POA: Diagnosis not present

## 2024-01-03 DIAGNOSIS — I5032 Chronic diastolic (congestive) heart failure: Secondary | ICD-10-CM | POA: Diagnosis not present

## 2024-01-03 DIAGNOSIS — M549 Dorsalgia, unspecified: Secondary | ICD-10-CM | POA: Diagnosis not present

## 2024-01-03 DIAGNOSIS — F0280 Dementia in other diseases classified elsewhere without behavioral disturbance: Secondary | ICD-10-CM | POA: Diagnosis not present

## 2024-01-03 DIAGNOSIS — Z741 Need for assistance with personal care: Secondary | ICD-10-CM | POA: Diagnosis not present

## 2024-01-03 DIAGNOSIS — R32 Unspecified urinary incontinence: Secondary | ICD-10-CM | POA: Diagnosis not present

## 2024-01-03 DIAGNOSIS — G309 Alzheimer's disease, unspecified: Secondary | ICD-10-CM | POA: Diagnosis not present

## 2024-01-04 DIAGNOSIS — R32 Unspecified urinary incontinence: Secondary | ICD-10-CM | POA: Diagnosis not present

## 2024-01-04 DIAGNOSIS — Z741 Need for assistance with personal care: Secondary | ICD-10-CM | POA: Diagnosis not present

## 2024-01-04 DIAGNOSIS — G309 Alzheimer's disease, unspecified: Secondary | ICD-10-CM | POA: Diagnosis not present

## 2024-01-04 DIAGNOSIS — F0280 Dementia in other diseases classified elsewhere without behavioral disturbance: Secondary | ICD-10-CM | POA: Diagnosis not present

## 2024-01-04 DIAGNOSIS — I5032 Chronic diastolic (congestive) heart failure: Secondary | ICD-10-CM | POA: Diagnosis not present

## 2024-01-04 DIAGNOSIS — M549 Dorsalgia, unspecified: Secondary | ICD-10-CM | POA: Diagnosis not present

## 2024-01-05 DIAGNOSIS — I5032 Chronic diastolic (congestive) heart failure: Secondary | ICD-10-CM | POA: Diagnosis not present

## 2024-01-05 DIAGNOSIS — R32 Unspecified urinary incontinence: Secondary | ICD-10-CM | POA: Diagnosis not present

## 2024-01-05 DIAGNOSIS — F0280 Dementia in other diseases classified elsewhere without behavioral disturbance: Secondary | ICD-10-CM | POA: Diagnosis not present

## 2024-01-05 DIAGNOSIS — G309 Alzheimer's disease, unspecified: Secondary | ICD-10-CM | POA: Diagnosis not present

## 2024-01-05 DIAGNOSIS — M549 Dorsalgia, unspecified: Secondary | ICD-10-CM | POA: Diagnosis not present

## 2024-01-05 DIAGNOSIS — Z741 Need for assistance with personal care: Secondary | ICD-10-CM | POA: Diagnosis not present

## 2024-01-06 DIAGNOSIS — R32 Unspecified urinary incontinence: Secondary | ICD-10-CM | POA: Diagnosis not present

## 2024-01-06 DIAGNOSIS — F0280 Dementia in other diseases classified elsewhere without behavioral disturbance: Secondary | ICD-10-CM | POA: Diagnosis not present

## 2024-01-06 DIAGNOSIS — M549 Dorsalgia, unspecified: Secondary | ICD-10-CM | POA: Diagnosis not present

## 2024-01-06 DIAGNOSIS — G309 Alzheimer's disease, unspecified: Secondary | ICD-10-CM | POA: Diagnosis not present

## 2024-01-06 DIAGNOSIS — I5032 Chronic diastolic (congestive) heart failure: Secondary | ICD-10-CM | POA: Diagnosis not present

## 2024-01-06 DIAGNOSIS — Z741 Need for assistance with personal care: Secondary | ICD-10-CM | POA: Diagnosis not present

## 2024-01-09 DIAGNOSIS — F0280 Dementia in other diseases classified elsewhere without behavioral disturbance: Secondary | ICD-10-CM | POA: Diagnosis not present

## 2024-01-09 DIAGNOSIS — Z741 Need for assistance with personal care: Secondary | ICD-10-CM | POA: Diagnosis not present

## 2024-01-09 DIAGNOSIS — I5032 Chronic diastolic (congestive) heart failure: Secondary | ICD-10-CM | POA: Diagnosis not present

## 2024-01-09 DIAGNOSIS — M549 Dorsalgia, unspecified: Secondary | ICD-10-CM | POA: Diagnosis not present

## 2024-01-09 DIAGNOSIS — G309 Alzheimer's disease, unspecified: Secondary | ICD-10-CM | POA: Diagnosis not present

## 2024-01-09 DIAGNOSIS — R32 Unspecified urinary incontinence: Secondary | ICD-10-CM | POA: Diagnosis not present

## 2024-01-11 DIAGNOSIS — I5032 Chronic diastolic (congestive) heart failure: Secondary | ICD-10-CM | POA: Diagnosis not present

## 2024-01-11 DIAGNOSIS — R32 Unspecified urinary incontinence: Secondary | ICD-10-CM | POA: Diagnosis not present

## 2024-01-11 DIAGNOSIS — M549 Dorsalgia, unspecified: Secondary | ICD-10-CM | POA: Diagnosis not present

## 2024-01-11 DIAGNOSIS — G309 Alzheimer's disease, unspecified: Secondary | ICD-10-CM | POA: Diagnosis not present

## 2024-01-11 DIAGNOSIS — Z741 Need for assistance with personal care: Secondary | ICD-10-CM | POA: Diagnosis not present

## 2024-01-11 DIAGNOSIS — F0280 Dementia in other diseases classified elsewhere without behavioral disturbance: Secondary | ICD-10-CM | POA: Diagnosis not present

## 2024-01-18 ENCOUNTER — Ambulatory Visit

## 2024-01-26 ENCOUNTER — Telehealth (INDEPENDENT_AMBULATORY_CARE_PROVIDER_SITE_OTHER): Admitting: Internal Medicine

## 2024-01-26 DIAGNOSIS — I5032 Chronic diastolic (congestive) heart failure: Secondary | ICD-10-CM | POA: Diagnosis not present

## 2024-01-26 DIAGNOSIS — M79662 Pain in left lower leg: Secondary | ICD-10-CM

## 2024-01-26 DIAGNOSIS — M79661 Pain in right lower leg: Secondary | ICD-10-CM | POA: Diagnosis not present

## 2024-01-26 DIAGNOSIS — R531 Weakness: Secondary | ICD-10-CM

## 2024-01-26 DIAGNOSIS — F0280 Dementia in other diseases classified elsewhere without behavioral disturbance: Secondary | ICD-10-CM | POA: Diagnosis not present

## 2024-01-26 DIAGNOSIS — R296 Repeated falls: Secondary | ICD-10-CM | POA: Diagnosis not present

## 2024-01-26 DIAGNOSIS — N390 Urinary tract infection, site not specified: Secondary | ICD-10-CM

## 2024-01-26 DIAGNOSIS — G309 Alzheimer's disease, unspecified: Secondary | ICD-10-CM | POA: Diagnosis not present

## 2024-01-26 MED ORDER — OMEPRAZOLE 20 MG PO CPDR: 40.0000 mg | DELAYED_RELEASE_CAPSULE | Freq: Every day | ORAL | 1 refills | Status: AC

## 2024-01-26 MED ORDER — POTASSIUM CHLORIDE ER 8 MEQ PO TBCR: 8.0000 meq | EXTENDED_RELEASE_TABLET | Freq: Every day | ORAL | 3 refills | Status: AC

## 2024-01-26 MED ORDER — RIVAROXABAN 10 MG PO TABS: 10.0000 mg | ORAL_TABLET | Freq: Every day | ORAL | 5 refills | Status: AC

## 2024-01-26 MED ORDER — CIPROFLOXACIN HCL 250 MG PO TABS: 250.0000 mg | ORAL_TABLET | Freq: Two times a day (BID) | ORAL | 2 refills | Status: AC

## 2024-01-26 MED ORDER — MEMANTINE HCL 10 MG PO TABS: 10.0000 mg | ORAL_TABLET | Freq: Two times a day (BID) | ORAL | 3 refills | Status: AC

## 2024-01-26 MED ORDER — DOCUSATE SODIUM 100 MG PO CAPS: 100.0000 mg | ORAL_CAPSULE | Freq: Two times a day (BID) | ORAL | 5 refills | Status: AC

## 2024-01-26 MED ORDER — FLUDROCORTISONE ACETATE 0.1 MG PO TABS: ORAL_TABLET | ORAL | 1 refills | Status: AC

## 2024-01-26 MED ORDER — PYRIDOSTIGMINE BROMIDE 60 MG PO TABS: 60.0000 mg | ORAL_TABLET | Freq: Three times a day (TID) | ORAL | 1 refills | Status: AC

## 2024-01-26 NOTE — Progress Notes (Signed)
 Virtual Visit via Video Note  I connected with Sabrina Page on 03/19/24 at  8:10 AM EDT by a video enabled telemedicine application and verified that I am speaking with the correct person using two identifiers.   I discussed the limitations of evaluation and management by telemedicine and the availability of in person appointments. The patient expressed understanding and agreed to proceed.  I was located at our [facility] office. The patient was at home. Dtr Dick is present in the visit.  Chief Complaint  Patient presents with   Hospice Follow up    Was discharged from Hospice and needing medication refills; also wanting to discuss occupational therapy and materials    Diarrhea    Was last week; also giving OTC meds for possible UTI (taking for months)     History of Present Illness:  Follow-up on advanced Alzheimer's dementia, recurrent UTIs, chronic anticoagulation. The patient was discharged from hospice Review of Systems  Constitutional:  Negative for fever and weight loss.  Neurological:  Positive for weakness. Negative for tremors and seizures.  Psychiatric/Behavioral:  Positive for memory loss. Negative for depression.      Observations/Objective: The patient appears to be in no acute distress  Assessment and Plan:  Problem List Items Addressed This Visit     Acute on chronic diastolic congestive heart failure (HCC)   Chronic, stable No change in treatment She was discharged from hospice      Relevant Medications   rivaroxaban  (XARELTO ) 10 MG TABS tablet   Alzheimer disease (HCC) - Primary   Sabrina Page is suffering from the advanced form of Alzheimer's dementia and is incapable of making any medical, financial, etc. decisions for herself.      Relevant Medications   pyridostigmine  (MESTINON ) 60 MG tablet   memantine  (NAMENDA ) 10 MG tablet   Other Relevant Orders   Ambulatory referral to Home Health   Dementia due to Alzheimer's disease  (HCC)    Dtr Sabrina Page is her POA.      Relevant Medications   pyridostigmine  (MESTINON ) 60 MG tablet   memantine  (NAMENDA ) 10 MG tablet   Falls frequently   The patient is bedbound or wheelchair-bound.  Usually requires assistance of 2      Relevant Orders   Ambulatory referral to Home Health   Pain in limb   Weakness   Home PT/OT ordered        Meds ordered this encounter  Medications   potassium chloride  (KLOR-CON ) 8 MEQ tablet    Sig: Take 1 tablet (8 mEq total) by mouth daily.    Dispense:  90 tablet    Refill:  3   omeprazole  (PRILOSEC) 20 MG capsule    Sig: Take 2 capsules (40 mg total) by mouth daily.    Dispense:  180 capsule    Refill:  1   pyridostigmine  (MESTINON ) 60 MG tablet    Sig: Take 1 tablet (60 mg total) by mouth 3 (three) times daily.    Dispense:  270 tablet    Refill:  1   rivaroxaban  (XARELTO ) 10 MG TABS tablet    Sig: Take 1 tablet (10 mg total) by mouth daily. For 30 days to prevent blood clots after surgery    Dispense:  30 tablet    Refill:  5   fludrocortisone  (FLORINEF ) 0.1 MG tablet    Sig: TAKE 1 TABLET BY MOUTH EVERY DAY WITH DINNER    Dispense:  90 tablet    Refill:  1  ciprofloxacin  (CIPRO ) 250 MG tablet    Sig: Take 1 tablet (250 mg total) by mouth 2 (two) times daily. Use prn cystitis    Dispense:  14 tablet    Refill:  2   docusate sodium  (COLACE) 100 MG capsule    Sig: Take 1 capsule (100 mg total) by mouth 2 (two) times daily. Annual appt is due  must see provider for future refills    Dispense:  60 capsule    Refill:  5   memantine  (NAMENDA ) 10 MG tablet    Sig: Take 1 tablet (10 mg total) by mouth 2 (two) times daily.    Dispense:  180 tablet    Refill:  3     Follow Up Instructions:    I discussed the assessment and treatment plan with the patient. The patient was provided an opportunity to ask questions and all were answered. The patient agreed with the plan and demonstrated an understanding of the  instructions.   The patient was advised to call back or seek an in-person evaluation if the symptoms worsen or if the condition fails to improve as anticipated.  I provided face-to-face time during this encounter. We were at different locations.   Marolyn Noel, MD

## 2024-01-26 NOTE — Assessment & Plan Note (Signed)
 Sabrina Page is suffering from the advanced form of Alzheimer's dementia and is incapable of making any medical, financial, etc. decisions for herself.

## 2024-02-03 ENCOUNTER — Telehealth: Payer: Self-pay

## 2024-02-03 NOTE — Telephone Encounter (Signed)
 Copied from CRM #[id number]. Topic: Medical Record Request - Records Request >> Feb 03, 2024  2:50 PM Corin V wrote: Reason for CRM: Tobias with [employer] is needing the visit note from the 01/26/24 hospital follow up for a legal case. She is requesting the note by 02/07/24 for a determination by the legal team. Please fax notes to [phone].

## 2024-02-03 NOTE — Telephone Encounter (Signed)
 Notes requested faxed to Tobias of [employer]

## 2024-02-08 ENCOUNTER — Encounter: Payer: Self-pay | Admitting: Podiatry

## 2024-02-08 ENCOUNTER — Ambulatory Visit (INDEPENDENT_AMBULATORY_CARE_PROVIDER_SITE_OTHER): Admitting: Podiatry

## 2024-02-08 DIAGNOSIS — M79675 Pain in left toe(s): Secondary | ICD-10-CM

## 2024-02-08 DIAGNOSIS — B351 Tinea unguium: Secondary | ICD-10-CM

## 2024-02-08 DIAGNOSIS — M79674 Pain in right toe(s): Secondary | ICD-10-CM

## 2024-02-08 DIAGNOSIS — D696 Thrombocytopenia, unspecified: Secondary | ICD-10-CM

## 2024-02-08 NOTE — Progress Notes (Signed)
This patient presents  to my office for at risk foot care.  This patient requires this care by a professional since this patient will be at risk due to having thrombocytopenia.  This patient is unable to cut nails herself since the patient cannot reach her nails.These nails are painful walking and wearing shoes.   Patient presents to the office in a wheelchair.  She presents to the office with her caregiver. This patient presents for at risk foot care today.  General Appearance  Alert, conversant and in no acute stress.  Vascular  Dorsalis pedis and posterior tibial  pulses are absent  bilaterally.  Purple feet noted.  Capillary return is within normal limits  bilaterally. Cold feet. bilaterally.  Neurologic  deferred she is wearing compression socks.  Nails Thick disfigured discolored nails with subungual debris  from hallux to fifth toes bilaterally. No evidence of bacterial infection or drainage bilaterally.  Orthopedic  No limitations of motion  feet .  No crepitus or effusions noted.  No bony pathology or digital deformities noted.  Skin  normotropic skin with no porokeratosis noted bilaterally.  No signs of infections or ulcers noted.     Onychomycosis  Pain in right toes  Pain in left toes  Consent was obtained for treatment procedures.   Mechanical debridement of nails 1-5  bilaterally performed with a nail nipper.  Filed with dremel without incident.    Return office visit    prn                Told patient to return for periodic foot care and evaluation due to potential at risk complications.   Helane Gunther DPM

## 2024-03-19 ENCOUNTER — Encounter: Payer: Self-pay | Admitting: Internal Medicine

## 2024-03-19 NOTE — Assessment & Plan Note (Signed)
Home PT/OT ordered

## 2024-03-19 NOTE — Assessment & Plan Note (Addendum)
 Chronic, stable diastolic CHF No change in treatment She was discharged from hospice

## 2024-03-19 NOTE — Assessment & Plan Note (Signed)
 Cipro  prescription given to use as needed.  Extreme difficulties obtaining urine specimen for a UA

## 2024-03-19 NOTE — Assessment & Plan Note (Signed)
  Dtr [name] is her POA.

## 2024-03-19 NOTE — Assessment & Plan Note (Signed)
 The patient is bedbound or wheelchair-bound.  Usually requires assistance of 2

## 2024-03-27 ENCOUNTER — Encounter: Payer: Self-pay | Admitting: Internal Medicine

## 2024-03-30 ENCOUNTER — Other Ambulatory Visit: Payer: Self-pay | Admitting: Internal Medicine

## 2024-03-30 DIAGNOSIS — F0280 Dementia in other diseases classified elsewhere without behavioral disturbance: Secondary | ICD-10-CM

## 2024-03-30 DIAGNOSIS — R5382 Chronic fatigue, unspecified: Secondary | ICD-10-CM

## 2024-03-30 DIAGNOSIS — M79605 Pain in left leg: Secondary | ICD-10-CM

## 2024-03-30 DIAGNOSIS — I5032 Chronic diastolic (congestive) heart failure: Secondary | ICD-10-CM

## 2024-03-30 NOTE — Progress Notes (Signed)
 Nursing and Occupational Therapy from [facility]

## 2024-04-25 ENCOUNTER — Telehealth: Payer: Self-pay

## 2024-04-25 NOTE — Telephone Encounter (Signed)
 Copied from CRM #[id number]. Topic: Referral - Question >> Apr 24, 2024  4:03 PM Geneva B wrote: Reason for CRM: pt is calling says she has questions about her occupation therapy please call pt back [phone]

## 2024-05-01 NOTE — Telephone Encounter (Signed)
 Please schedule VOV. Last VOV was on 01/26/2024.  Needs to be<3 months for Medicare requirement. Thanks,

## 2024-05-02 NOTE — Telephone Encounter (Signed)
 Please schedule pt a VV with PCP as she is needing an updated visit to receive services.

## 2024-05-03 DIAGNOSIS — F02C0 Dementia in other diseases classified elsewhere, severe, without behavioral disturbance, psychotic disturbance, mood disturbance, and anxiety: Secondary | ICD-10-CM | POA: Diagnosis not present

## 2024-05-03 DIAGNOSIS — Z760 Encounter for issue of repeat prescription: Secondary | ICD-10-CM | POA: Diagnosis not present

## 2024-05-03 DIAGNOSIS — G301 Alzheimer's disease with late onset: Secondary | ICD-10-CM | POA: Diagnosis not present

## 2024-05-03 DIAGNOSIS — R54 Age-related physical debility: Secondary | ICD-10-CM | POA: Diagnosis not present

## 2024-05-03 DIAGNOSIS — I5032 Chronic diastolic (congestive) heart failure: Secondary | ICD-10-CM | POA: Diagnosis not present

## 2024-05-03 DIAGNOSIS — I110 Hypertensive heart disease with heart failure: Secondary | ICD-10-CM | POA: Diagnosis not present

## 2024-05-05 ENCOUNTER — Emergency Department (HOSPITAL_COMMUNITY)

## 2024-05-05 ENCOUNTER — Encounter (HOSPITAL_COMMUNITY): Payer: Self-pay | Admitting: Emergency Medicine

## 2024-05-05 ENCOUNTER — Other Ambulatory Visit: Payer: Self-pay

## 2024-05-05 ENCOUNTER — Emergency Department (HOSPITAL_COMMUNITY)
Admission: EM | Admit: 2024-05-05 | Discharge: 2024-05-06 | Disposition: A | Discharge status: Home / Self Care | Attending: Emergency Medicine | Admitting: Emergency Medicine

## 2024-05-05 DIAGNOSIS — S39012A Strain of muscle, fascia and tendon of lower back, initial encounter: Secondary | ICD-10-CM

## 2024-05-05 DIAGNOSIS — Z043 Encounter for examination and observation following other accident: Secondary | ICD-10-CM | POA: Diagnosis not present

## 2024-05-05 DIAGNOSIS — Z7982 Long term (current) use of aspirin: Secondary | ICD-10-CM | POA: Insufficient documentation

## 2024-05-05 DIAGNOSIS — G9389 Other specified disorders of brain: Secondary | ICD-10-CM | POA: Diagnosis not present

## 2024-05-05 DIAGNOSIS — Z7901 Long term (current) use of anticoagulants: Secondary | ICD-10-CM | POA: Insufficient documentation

## 2024-05-05 DIAGNOSIS — S0990XA Unspecified injury of head, initial encounter: Secondary | ICD-10-CM | POA: Diagnosis not present

## 2024-05-05 DIAGNOSIS — I6782 Cerebral ischemia: Secondary | ICD-10-CM | POA: Diagnosis not present

## 2024-05-05 DIAGNOSIS — M545 Low back pain, unspecified: Secondary | ICD-10-CM | POA: Insufficient documentation

## 2024-05-05 DIAGNOSIS — F0390 Unspecified dementia without behavioral disturbance: Secondary | ICD-10-CM | POA: Insufficient documentation

## 2024-05-05 MED ORDER — METHOCARBAMOL 500 MG PO TABS: 500.0000 mg | ORAL_TABLET | Freq: Two times a day (BID) | ORAL | 0 refills | Status: AC

## 2024-05-05 MED ORDER — LIDOCAINE 5 % EX PTCH
1.0000 | MEDICATED_PATCH | CUTANEOUS | Status: DC
  Administered 2024-05-05: 1 via TRANSDERMAL
  Filled 2024-05-05: qty 1

## 2024-05-05 MED ORDER — LIDOCAINE 5 % EX PTCH: 1.0000 | MEDICATED_PATCH | CUTANEOUS | 0 refills | Status: AC

## 2024-05-05 NOTE — ED Triage Notes (Signed)
 Patient here after fall this last Thursday after falling out of her wheelchair- the chair fell backwards. Unsure if she hit her head. She is on blood thinners. Patient was lifted by EMS but declined hospital evaluation at hospital at that time. C/o lower lumbar pain currently. On xarelto .

## 2024-05-05 NOTE — ED Provider Notes (Signed)
 [place] EMERGENCY DEPARTMENT AT [facility] Provider Note   CSN: [id number] Arrival date & time: 05/05/24  1943     Patient presents with: Fall   Sabrina Page is a 89 y.o. female.   89 year old female presents after a fall 3 days ago.  According to daughter, patient is on Xarelto  due to history of PE.  Was a transfer from wheelchair and fell backwards.  Unsure if she struck her head.  Did fall onto her lower lumbar spine.  EMS was contacted but they declined transport.  Daughter states that patient is complained of lower lumbar spine pain around L2-L3.  No new focal weakness.  Since the fall, she has not had any confusion.  She has not had any emesis.  Has been at her baseline although she does have a history of dementia       Prior to Admission medications  Medication Sig Start Date End Date Taking? Authorizing Provider  aspirin  81 MG EC tablet Take 1 tablet (81 mg total) by mouth 2 (two) times daily. Start after completing xarelto  prescription 09/04/21   Tobie Yetta HERO, MD  bisacodyl  (DULCOLAX) 10 MG suppository Place 1 suppository (10 mg total) rectally daily as needed for moderate constipation. Patient not taking: Reported on 08/14/2021 08/04/21   Tobie Yetta HERO, MD  Cholecalciferol  (VITAMIN D ) 125 MCG (5000 UT) CAPS Take 5,000 Units by mouth daily.    [provider]  ciprofloxacin  (CIPRO ) 250 MG tablet Take 1 tablet (250 mg total) by mouth 2 (two) times daily. Use prn cystitis 01/26/24   Plotnikov, Karlynn GAILS, MD  docusate sodium  (COLACE) 100 MG capsule Take 1 capsule (100 mg total) by mouth 2 (two) times daily. Annual appt is due  must see provider for future refills 01/26/24   Plotnikov, Karlynn GAILS, MD  feeding supplement (ENSURE ENLIVE / ENSURE PLUS) LIQD Take 237 mLs by mouth 3 (three) times daily between meals. Patient not taking: Reported on 08/14/2021 08/04/21   Tobie Yetta HERO, MD  fludrocortisone  (FLORINEF ) 0.1 MG tablet TAKE 1 TABLET BY MOUTH  EVERY DAY WITH DINNER 01/26/24   Plotnikov, Aleksei V, MD  FLUoxetine  (PROZAC ) 40 MG capsule TAKE 1 CAPSULE BY MOUTH EVERY DAY 01/07/22   Plotnikov, Aleksei V, MD  furosemide  (LASIX ) 20 MG tablet Take 1 tablet (20 mg total) by mouth every other day. 01/17/21 01/17/22  Drusilla Sabas RAMAN, MD  ketoconazole  (NIZORAL ) 200 MG tablet Take 1 tablet (200 mg total) by mouth daily. 06/01/21   Plotnikov, Aleksei V, MD  loratadine  (CLARITIN ) 10 MG tablet TAKE 1 TABLET BY MOUTH EVERY DAY IN THE MORNING 01/19/22   Plotnikov, Aleksei V, MD  memantine  (NAMENDA ) 10 MG tablet Take 1 tablet (10 mg total) by mouth 2 (two) times daily. 01/26/24   Plotnikov, Aleksei V, MD  Multiple Vitamin (MULTIVITAMIN WITH MINERALS) TABS tablet Take 1 tablet by mouth daily. 08/04/21   Tobie Yetta HERO, MD  omeprazole  (PRILOSEC) 20 MG capsule Take 2 capsules (40 mg total) by mouth daily. 01/26/24   Plotnikov, Karlynn GAILS, MD  OVER THE COUNTER MEDICATION Apply 1 application topically daily as needed (knee pain). Hemp Freeze    [provider]  polyethylene glycol (MIRALAX  / GLYCOLAX ) 17 g packet Take 17 g by mouth daily as needed for mild constipation. 02/11/21   Cheryle Page, MD  potassium chloride  (KLOR-CON ) 8 MEQ tablet Take 1 tablet (8 mEq total) by mouth daily. 01/26/24   Plotnikov, Karlynn GAILS, MD  pyridostigmine  (MESTINON ) 60  MG tablet Take 1 tablet (60 mg total) by mouth 3 (three) times daily. 01/26/24   Plotnikov, Karlynn GAILS, MD  rivaroxaban  (XARELTO ) 10 MG TABS tablet Take 1 tablet (10 mg total) by mouth daily. For 30 days to prevent blood clots after surgery 01/26/24   Plotnikov, Karlynn GAILS, MD  senna-docusate (SENOKOT-S) 8.6-50 MG tablet Take 1 tablet by mouth 2 (two) times daily. 02/11/21   Cheryle Page, MD    Allergies: Atorvastatin, Enalapril maleate, Hctz Andreas.balling ], Lovastatin, Namenda  [memantine  hcl], Simvastatin, and Topamax  [topiramate ]    Review of Systems  All other systems reviewed and are negative.   Updated Vital  Signs BP 122/62 (BP Location: Right Arm)   Pulse 69   Temp 97.9 F (36.6 C) (Oral)   Resp (!) 22   Ht 1.651 m (5' 5)   Wt 99 kg   SpO2 97%   BMI 36.32 kg/m   Physical Exam Vitals and nursing note reviewed.  Constitutional:      General: She is not in acute distress.    Appearance: Normal appearance. She is well-developed. She is not toxic-appearing.  HENT:     Head: Normocephalic and atraumatic.  Eyes:     General: Lids are normal.     Conjunctiva/sclera: Conjunctivae normal.     Pupils: Pupils are equal, round, and reactive to light.  Neck:     Thyroid : No thyroid  mass.     Trachea: No tracheal deviation.  Cardiovascular:     Rate and Rhythm: Normal rate and regular rhythm.     Heart sounds: Normal heart sounds. No murmur heard.    No gallop.  Pulmonary:     Effort: Pulmonary effort is normal. No respiratory distress.     Breath sounds: Normal breath sounds. No stridor. No decreased breath sounds, wheezing, rhonchi or rales.  Abdominal:     General: There is no distension.     Palpations: Abdomen is soft.     Tenderness: There is no abdominal tenderness. There is no rebound.  Musculoskeletal:        General: No tenderness. Normal range of motion.     Cervical back: Normal range of motion and neck supple.       Back:  Skin:    General: Skin is warm and dry.     Findings: No abrasion or rash.  Neurological:     General: No focal deficit present.     Mental Status: She is alert. Mental status is at baseline. She is disoriented.     GCS: GCS eye subscore is 4. GCS verbal subscore is 5. GCS motor subscore is 6.     Cranial Nerves: No cranial nerve deficit.     Sensory: No sensory deficit.     Motor: Motor function is intact.     Comments: Strength is 5/5 bilateral lower extremities  Psychiatric:        Attention and Perception: Attention normal.        Speech: Speech normal.        Behavior: Behavior normal.     (all labs ordered are listed, but only abnormal  results are displayed) Labs Reviewed - No data to display  EKG: None  Radiology: No results found.   Procedures   Medications Ordered in the ED - No data to display  Medical Decision Making Amount and/or Complexity of Data Reviewed Radiology: ordered.   Patient's x-rays of her thoracic lumbar spine without fracture.  Head CT without acute findings.  Will give lidocaine  patch in place of electrolytes as a discharge     Final diagnoses:  None    ED Discharge Orders     None          Dasie Faden, MD 05/05/24 2206

## 2024-05-05 NOTE — ED Notes (Signed)
 Ptar called unable to give pick up time

## 2024-05-06 NOTE — ED Notes (Signed)
 Daughter called and confirmed to be at patients home waiting for transport to receive.

## 2024-05-09 ENCOUNTER — Ambulatory Visit: Admitting: Podiatry

## 2024-08-06 ENCOUNTER — Ambulatory Visit: Admitting: Podiatry
